# Patient Record
Sex: Female | Born: 1970 | Race: Black or African American | Hispanic: No | Marital: Married | Smoking: Never smoker
Health system: Southern US, Community
[De-identification: ages and names within clinical notes are randomized; demographics above are authoritative.]

## PROBLEM LIST (undated history)

## (undated) DIAGNOSIS — R002 Palpitations: Secondary | ICD-10-CM

## (undated) DIAGNOSIS — I209 Angina pectoris, unspecified: Secondary | ICD-10-CM

## (undated) DIAGNOSIS — R569 Unspecified convulsions: Secondary | ICD-10-CM

## (undated) DIAGNOSIS — G620 Drug-induced polyneuropathy: Principal | ICD-10-CM

## (undated) DIAGNOSIS — R160 Hepatomegaly, not elsewhere classified: Secondary | ICD-10-CM

## (undated) DIAGNOSIS — F329 Major depressive disorder, single episode, unspecified: Secondary | ICD-10-CM

## (undated) DIAGNOSIS — I10 Essential (primary) hypertension: Secondary | ICD-10-CM

## (undated) DIAGNOSIS — I509 Heart failure, unspecified: Secondary | ICD-10-CM

## (undated) DIAGNOSIS — F419 Anxiety disorder, unspecified: Secondary | ICD-10-CM

## (undated) DIAGNOSIS — Z923 Personal history of irradiation: Secondary | ICD-10-CM

## (undated) DIAGNOSIS — E669 Obesity, unspecified: Secondary | ICD-10-CM

## (undated) DIAGNOSIS — G4700 Insomnia, unspecified: Secondary | ICD-10-CM

## (undated) DIAGNOSIS — E785 Hyperlipidemia, unspecified: Secondary | ICD-10-CM

## (undated) DIAGNOSIS — K219 Gastro-esophageal reflux disease without esophagitis: Secondary | ICD-10-CM

## (undated) DIAGNOSIS — N809 Endometriosis, unspecified: Secondary | ICD-10-CM

## (undated) DIAGNOSIS — Z95810 Presence of automatic (implantable) cardiac defibrillator: Secondary | ICD-10-CM

## (undated) DIAGNOSIS — F410 Panic disorder [episodic paroxysmal anxiety] without agoraphobia: Secondary | ICD-10-CM

## (undated) DIAGNOSIS — G629 Polyneuropathy, unspecified: Secondary | ICD-10-CM

## (undated) DIAGNOSIS — L709 Acne, unspecified: Secondary | ICD-10-CM

## (undated) DIAGNOSIS — R51 Headache: Secondary | ICD-10-CM

## (undated) DIAGNOSIS — N3010 Interstitial cystitis (chronic) without hematuria: Secondary | ICD-10-CM

## (undated) DIAGNOSIS — D649 Anemia, unspecified: Secondary | ICD-10-CM

## (undated) DIAGNOSIS — R921 Mammographic calcification found on diagnostic imaging of breast: Secondary | ICD-10-CM

## (undated) DIAGNOSIS — Z5189 Encounter for other specified aftercare: Secondary | ICD-10-CM

## (undated) DIAGNOSIS — E119 Type 2 diabetes mellitus without complications: Secondary | ICD-10-CM

## (undated) DIAGNOSIS — Z8711 Personal history of peptic ulcer disease: Secondary | ICD-10-CM

## (undated) DIAGNOSIS — J302 Other seasonal allergic rhinitis: Secondary | ICD-10-CM

## (undated) DIAGNOSIS — Z8719 Personal history of other diseases of the digestive system: Secondary | ICD-10-CM

## (undated) DIAGNOSIS — F09 Unspecified mental disorder due to known physiological condition: Secondary | ICD-10-CM

## (undated) DIAGNOSIS — S0592XA Unspecified injury of left eye and orbit, initial encounter: Secondary | ICD-10-CM

## (undated) DIAGNOSIS — F32A Depression, unspecified: Secondary | ICD-10-CM

## (undated) DIAGNOSIS — Z8041 Family history of malignant neoplasm of ovary: Secondary | ICD-10-CM

## (undated) DIAGNOSIS — C50919 Malignant neoplasm of unspecified site of unspecified female breast: Secondary | ICD-10-CM

## (undated) DIAGNOSIS — M797 Fibromyalgia: Secondary | ICD-10-CM

## (undated) DIAGNOSIS — F449 Dissociative and conversion disorder, unspecified: Secondary | ICD-10-CM

## (undated) DIAGNOSIS — Z800 Family history of malignant neoplasm of digestive organs: Secondary | ICD-10-CM

## (undated) DIAGNOSIS — I428 Other cardiomyopathies: Secondary | ICD-10-CM

## (undated) DIAGNOSIS — F0789 Other personality and behavioral disorders due to known physiological condition: Secondary | ICD-10-CM

## (undated) DIAGNOSIS — R270 Ataxia, unspecified: Secondary | ICD-10-CM

## (undated) DIAGNOSIS — J189 Pneumonia, unspecified organism: Secondary | ICD-10-CM

## (undated) DIAGNOSIS — R519 Headache, unspecified: Secondary | ICD-10-CM

## (undated) DIAGNOSIS — M1990 Unspecified osteoarthritis, unspecified site: Secondary | ICD-10-CM

## (undated) DIAGNOSIS — J449 Chronic obstructive pulmonary disease, unspecified: Secondary | ICD-10-CM

## (undated) HISTORY — DX: Essential (primary) hypertension: I10

## (undated) HISTORY — PX: BREAST EXCISIONAL BIOPSY: SUR124

## (undated) HISTORY — DX: Endometriosis, unspecified: N80.9

## (undated) HISTORY — DX: Polyneuropathy, unspecified: G62.9

## (undated) HISTORY — PX: PORT-A-CATH REMOVAL: SHX5289

## (undated) HISTORY — DX: Insomnia, unspecified: G47.00

## (undated) HISTORY — DX: Other seasonal allergic rhinitis: J30.2

## (undated) HISTORY — DX: Drug-induced polyneuropathy: G62.0

## (undated) HISTORY — DX: Hyperlipidemia, unspecified: E78.5

## (undated) HISTORY — DX: Major depressive disorder, single episode, unspecified: F32.9

## (undated) HISTORY — DX: Mammographic calcification found on diagnostic imaging of breast: R92.1

## (undated) HISTORY — DX: Other personality and behavioral disorders due to known physiological condition: F07.89

## (undated) HISTORY — DX: Depression, unspecified: F32.A

## (undated) HISTORY — PX: ABDOMINAL HYSTERECTOMY: SHX81

## (undated) HISTORY — DX: Ataxia, unspecified: R27.0

## (undated) HISTORY — DX: Family history of malignant neoplasm of ovary: Z80.41

## (undated) HISTORY — PX: BREAST LUMPECTOMY: SHX2

## (undated) HISTORY — DX: Panic disorder (episodic paroxysmal anxiety): F41.0

## (undated) HISTORY — DX: Malignant neoplasm of unspecified site of unspecified female breast: C50.919

## (undated) HISTORY — DX: Chronic obstructive pulmonary disease, unspecified: J44.9

## (undated) HISTORY — PX: CHOLECYSTECTOMY: SHX55

## (undated) HISTORY — DX: Fibromyalgia: M79.7

## (undated) HISTORY — PX: COLONOSCOPY: SHX174

## (undated) HISTORY — PX: NASAL SINUS SURGERY: SHX719

## (undated) HISTORY — DX: Heart failure, unspecified: I50.9

## (undated) HISTORY — DX: Family history of malignant neoplasm of digestive organs: Z80.0

## (undated) HISTORY — DX: Unspecified mental disorder due to known physiological condition: F09

## (undated) HISTORY — DX: Hepatomegaly, not elsewhere classified: R16.0

## (undated) HISTORY — DX: Anemia, unspecified: D64.9

## (undated) HISTORY — PX: TONSILLECTOMY: SUR1361

## (undated) HISTORY — DX: Encounter for other specified aftercare: Z51.89

## (undated) HISTORY — DX: Palpitations: R00.2

## (undated) HISTORY — PX: TUBAL LIGATION: SHX77

## (undated) HISTORY — DX: Obesity, unspecified: E66.9

## (undated) HISTORY — DX: Acne, unspecified: L70.9

## (undated) HISTORY — DX: Gastro-esophageal reflux disease without esophagitis: K21.9

## (undated) HISTORY — PX: LAPAROSCOPIC ENDOMETRIOSIS FULGURATION: SUR769

## (undated) HISTORY — DX: Unspecified injury of left eye and orbit, initial encounter: S05.92XA

## (undated) HISTORY — DX: Unspecified osteoarthritis, unspecified site: M19.90

## (undated) HISTORY — DX: Other cardiomyopathies: I42.8

---
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
## 1999-02-13 DIAGNOSIS — J189 Pneumonia, unspecified organism: Secondary | ICD-10-CM

## 1999-02-13 HISTORY — DX: Pneumonia, unspecified organism: J18.9

## 1999-09-06 ENCOUNTER — Emergency Department (HOSPITAL_COMMUNITY): Admission: EM | Admit: 1999-09-06 | Discharge: 1999-09-06 | Payer: Self-pay | Admitting: Emergency Medicine

## 1999-09-06 ENCOUNTER — Encounter: Payer: Self-pay | Admitting: Internal Medicine

## 1999-09-11 ENCOUNTER — Encounter (INDEPENDENT_AMBULATORY_CARE_PROVIDER_SITE_OTHER): Payer: Self-pay

## 1999-09-11 ENCOUNTER — Ambulatory Visit (HOSPITAL_COMMUNITY): Admission: RE | Admit: 1999-09-11 | Discharge: 1999-09-11 | Payer: Self-pay | Admitting: Obstetrics and Gynecology

## 2000-11-01 ENCOUNTER — Other Ambulatory Visit: Admission: RE | Admit: 2000-11-01 | Discharge: 2000-11-01 | Payer: Self-pay | Admitting: Obstetrics and Gynecology

## 2000-11-09 ENCOUNTER — Ambulatory Visit (HOSPITAL_COMMUNITY): Admission: RE | Admit: 2000-11-09 | Discharge: 2000-11-09 | Payer: Self-pay | Admitting: Pulmonary Disease

## 2001-03-23 ENCOUNTER — Encounter: Payer: Self-pay | Admitting: Obstetrics and Gynecology

## 2001-03-23 ENCOUNTER — Ambulatory Visit (HOSPITAL_COMMUNITY): Admission: RE | Admit: 2001-03-23 | Discharge: 2001-03-23 | Payer: Self-pay | Admitting: Obstetrics and Gynecology

## 2001-03-24 ENCOUNTER — Inpatient Hospital Stay (HOSPITAL_COMMUNITY): Admission: AD | Admit: 2001-03-24 | Discharge: 2001-03-24 | Payer: Self-pay | Admitting: Obstetrics and Gynecology

## 2001-05-31 ENCOUNTER — Encounter (INDEPENDENT_AMBULATORY_CARE_PROVIDER_SITE_OTHER): Payer: Self-pay

## 2001-05-31 ENCOUNTER — Inpatient Hospital Stay (HOSPITAL_COMMUNITY): Admission: AD | Admit: 2001-05-31 | Discharge: 2001-06-04 | Payer: Self-pay | Admitting: Obstetrics and Gynecology

## 2001-12-01 ENCOUNTER — Other Ambulatory Visit: Admission: RE | Admit: 2001-12-01 | Discharge: 2001-12-01 | Payer: Self-pay | Admitting: Obstetrics and Gynecology

## 2003-01-08 ENCOUNTER — Other Ambulatory Visit: Admission: RE | Admit: 2003-01-08 | Discharge: 2003-01-08 | Payer: Self-pay | Admitting: Obstetrics and Gynecology

## 2003-11-15 ENCOUNTER — Encounter: Admission: RE | Admit: 2003-11-15 | Discharge: 2003-11-15 | Payer: Self-pay | Admitting: Internal Medicine

## 2004-01-09 ENCOUNTER — Other Ambulatory Visit: Admission: RE | Admit: 2004-01-09 | Discharge: 2004-01-09 | Payer: Self-pay | Admitting: Obstetrics and Gynecology

## 2004-02-07 ENCOUNTER — Emergency Department (HOSPITAL_COMMUNITY): Admission: EM | Admit: 2004-02-07 | Discharge: 2004-02-07 | Payer: Self-pay | Admitting: Emergency Medicine

## 2005-03-03 ENCOUNTER — Emergency Department (HOSPITAL_COMMUNITY): Admission: EM | Admit: 2005-03-03 | Discharge: 2005-03-03 | Payer: Self-pay | Admitting: Emergency Medicine

## 2005-03-04 ENCOUNTER — Other Ambulatory Visit: Admission: RE | Admit: 2005-03-04 | Discharge: 2005-03-04 | Payer: Self-pay | Admitting: Obstetrics and Gynecology

## 2005-08-20 ENCOUNTER — Encounter (INDEPENDENT_AMBULATORY_CARE_PROVIDER_SITE_OTHER): Payer: Self-pay | Admitting: Specialist

## 2005-08-20 ENCOUNTER — Ambulatory Visit (HOSPITAL_COMMUNITY): Admission: RE | Admit: 2005-08-20 | Discharge: 2005-08-20 | Payer: Self-pay | Admitting: Obstetrics and Gynecology

## 2006-04-13 ENCOUNTER — Ambulatory Visit (HOSPITAL_COMMUNITY): Admission: RE | Admit: 2006-04-13 | Discharge: 2006-04-14 | Payer: Self-pay | Admitting: Obstetrics and Gynecology

## 2006-04-13 ENCOUNTER — Encounter (INDEPENDENT_AMBULATORY_CARE_PROVIDER_SITE_OTHER): Payer: Self-pay | Admitting: Specialist

## 2006-08-01 ENCOUNTER — Encounter: Admission: RE | Admit: 2006-08-01 | Discharge: 2006-08-01 | Payer: Self-pay | Admitting: Obstetrics and Gynecology

## 2006-08-01 ENCOUNTER — Encounter (INDEPENDENT_AMBULATORY_CARE_PROVIDER_SITE_OTHER): Payer: Self-pay | Admitting: Diagnostic Radiology

## 2006-08-01 ENCOUNTER — Encounter (INDEPENDENT_AMBULATORY_CARE_PROVIDER_SITE_OTHER): Payer: Self-pay | Admitting: *Deleted

## 2006-08-10 ENCOUNTER — Encounter: Admission: RE | Admit: 2006-08-10 | Discharge: 2006-08-10 | Payer: Self-pay | Admitting: Obstetrics and Gynecology

## 2006-08-19 ENCOUNTER — Encounter: Admission: RE | Admit: 2006-08-19 | Discharge: 2006-08-19 | Payer: Self-pay | Admitting: General Surgery

## 2006-08-22 ENCOUNTER — Ambulatory Visit (HOSPITAL_BASED_OUTPATIENT_CLINIC_OR_DEPARTMENT_OTHER): Admission: RE | Admit: 2006-08-22 | Discharge: 2006-08-22 | Payer: Self-pay | Admitting: General Surgery

## 2006-08-22 ENCOUNTER — Encounter (INDEPENDENT_AMBULATORY_CARE_PROVIDER_SITE_OTHER): Payer: Self-pay | Admitting: Specialist

## 2006-08-24 ENCOUNTER — Ambulatory Visit: Payer: Self-pay | Admitting: Oncology

## 2006-08-31 LAB — CBC WITH DIFFERENTIAL/PLATELET
Basophils Absolute: 0 10*3/uL (ref 0.0–0.1)
EOS%: 0.9 % (ref 0.0–7.0)
HGB: 11.5 g/dL — ABNORMAL LOW (ref 11.6–15.9)
MCH: 30.4 pg (ref 26.0–34.0)
MCHC: 34.2 g/dL (ref 32.0–36.0)
MCV: 88.8 fL (ref 81.0–101.0)
MONO%: 8.3 % (ref 0.0–13.0)
RDW: 13 % (ref 11.3–14.5)

## 2006-08-31 LAB — COMPREHENSIVE METABOLIC PANEL
AST: 58 U/L — ABNORMAL HIGH (ref 0–37)
Alkaline Phosphatase: 71 U/L (ref 39–117)
BUN: 10 mg/dL (ref 6–23)
Creatinine, Ser: 0.64 mg/dL (ref 0.40–1.20)
Potassium: 4 mEq/L (ref 3.5–5.3)

## 2006-09-06 ENCOUNTER — Ambulatory Visit (HOSPITAL_COMMUNITY): Admission: RE | Admit: 2006-09-06 | Discharge: 2006-09-06 | Payer: Self-pay | Admitting: Oncology

## 2006-09-07 ENCOUNTER — Ambulatory Visit (HOSPITAL_BASED_OUTPATIENT_CLINIC_OR_DEPARTMENT_OTHER): Admission: RE | Admit: 2006-09-07 | Discharge: 2006-09-08 | Payer: Self-pay | Admitting: General Surgery

## 2006-09-07 ENCOUNTER — Encounter (INDEPENDENT_AMBULATORY_CARE_PROVIDER_SITE_OTHER): Payer: Self-pay | Admitting: Specialist

## 2006-09-15 ENCOUNTER — Ambulatory Visit (HOSPITAL_COMMUNITY): Admission: RE | Admit: 2006-09-15 | Discharge: 2006-09-15 | Payer: Self-pay | Admitting: Oncology

## 2006-09-16 ENCOUNTER — Ambulatory Visit: Payer: Self-pay

## 2006-09-16 ENCOUNTER — Encounter: Payer: Self-pay | Admitting: Cardiology

## 2006-09-21 LAB — CBC WITH DIFFERENTIAL/PLATELET
Basophils Absolute: 0 10*3/uL (ref 0.0–0.1)
Eosinophils Absolute: 0.1 10*3/uL (ref 0.0–0.5)
HCT: 34.5 % — ABNORMAL LOW (ref 34.8–46.6)
LYMPH%: 27.3 % (ref 14.0–48.0)
MCHC: 34.1 g/dL (ref 32.0–36.0)
MONO#: 0.6 10*3/uL (ref 0.1–0.9)
NEUT#: 4.9 10*3/uL (ref 1.5–6.5)
NEUT%: 63.2 % (ref 39.6–76.8)
Platelets: 298 10*3/uL (ref 145–400)
WBC: 7.7 10*3/uL (ref 3.9–10.0)

## 2006-09-21 LAB — LACTATE DEHYDROGENASE: LDH: 159 U/L (ref 94–250)

## 2006-09-21 LAB — COMPREHENSIVE METABOLIC PANEL
BUN: 12 mg/dL (ref 6–23)
CO2: 24 mEq/L (ref 19–32)
Creatinine, Ser: 0.8 mg/dL (ref 0.40–1.20)
Glucose, Bld: 95 mg/dL (ref 70–99)
Total Bilirubin: 0.7 mg/dL (ref 0.3–1.2)

## 2006-09-21 LAB — CANCER ANTIGEN 27.29: CA 27.29: 23 U/mL (ref 0–39)

## 2006-10-10 ENCOUNTER — Ambulatory Visit: Payer: Self-pay | Admitting: Oncology

## 2006-10-10 ENCOUNTER — Encounter: Admission: RE | Admit: 2006-10-10 | Discharge: 2007-01-08 | Payer: Self-pay | Admitting: General Surgery

## 2006-10-10 LAB — UA PROTEIN, DIPSTICK - CHCC: Protein, Urine: NEGATIVE mg/dL

## 2006-10-10 LAB — PROTHROMBIN TIME
INR: 1 (ref 0.0–1.5)
Prothrombin Time: 13.5 seconds (ref 11.6–15.2)

## 2006-10-10 LAB — APTT: aPTT: 28 seconds (ref 24–37)

## 2006-10-11 LAB — PROTEIN / CREATININE RATIO, URINE: Creatinine, Urine: 148.2 mg/dL

## 2006-10-20 LAB — CBC WITH DIFFERENTIAL/PLATELET
BASO%: 0.5 % (ref 0.0–2.0)
Basophils Absolute: 0 10*3/uL (ref 0.0–0.1)
HCT: 35.5 % (ref 34.8–46.6)
HGB: 12 g/dL (ref 11.6–15.9)
MONO#: 0.5 10*3/uL (ref 0.1–0.9)
NEUT#: 3.1 10*3/uL (ref 1.5–6.5)
NEUT%: 53.5 % (ref 39.6–76.8)
WBC: 5.8 10*3/uL (ref 3.9–10.0)
lymph#: 2.1 10*3/uL (ref 0.9–3.3)

## 2006-10-20 LAB — COMPREHENSIVE METABOLIC PANEL
AST: 15 U/L (ref 0–37)
Albumin: 4.7 g/dL (ref 3.5–5.2)
BUN: 8 mg/dL (ref 6–23)
Calcium: 9.3 mg/dL (ref 8.4–10.5)
Chloride: 103 mEq/L (ref 96–112)
Potassium: 4 mEq/L (ref 3.5–5.3)

## 2006-11-03 LAB — COMPREHENSIVE METABOLIC PANEL
ALT: 23 U/L (ref 0–35)
AST: 22 U/L (ref 0–37)
Albumin: 4.8 g/dL (ref 3.5–5.2)
Alkaline Phosphatase: 92 U/L (ref 39–117)
Potassium: 4.2 mEq/L (ref 3.5–5.3)
Sodium: 135 mEq/L (ref 135–145)
Total Protein: 7.3 g/dL (ref 6.0–8.3)

## 2006-11-03 LAB — CBC WITH DIFFERENTIAL/PLATELET
BASO%: 0.4 % (ref 0.0–2.0)
Basophils Absolute: 0.1 10*3/uL (ref 0.0–0.1)
EOS%: 0 % (ref 0.0–7.0)
HGB: 11.5 g/dL — ABNORMAL LOW (ref 11.6–15.9)
MCH: 30.2 pg (ref 26.0–34.0)
MCHC: 34.3 g/dL (ref 32.0–36.0)
RBC: 3.81 10*6/uL (ref 3.70–5.32)
RDW: 11.2 % — ABNORMAL LOW (ref 11.3–14.5)
lymph#: 1.5 10*3/uL (ref 0.9–3.3)

## 2006-11-03 LAB — UA PROTEIN, DIPSTICK - CHCC: Protein, Urine: NEGATIVE mg/dL

## 2006-11-14 LAB — CBC WITH DIFFERENTIAL/PLATELET
BASO%: 0.8 % (ref 0.0–2.0)
Eosinophils Absolute: 0 10*3/uL (ref 0.0–0.5)
MCHC: 33.5 g/dL (ref 32.0–36.0)
MONO#: 0.7 10*3/uL (ref 0.1–0.9)
NEUT#: 8.2 10*3/uL — ABNORMAL HIGH (ref 1.5–6.5)
RBC: 3.54 10*6/uL — ABNORMAL LOW (ref 3.70–5.32)
RDW: 12.8 % (ref 11.3–14.5)
WBC: 10.5 10*3/uL — ABNORMAL HIGH (ref 3.9–10.0)
lymph#: 1.5 10*3/uL (ref 0.9–3.3)

## 2006-11-17 LAB — COMPREHENSIVE METABOLIC PANEL
ALT: 35 U/L (ref 0–35)
CO2: 24 mEq/L (ref 19–32)
Calcium: 8.7 mg/dL (ref 8.4–10.5)
Chloride: 105 mEq/L (ref 96–112)
Creatinine, Ser: 0.56 mg/dL (ref 0.40–1.20)
Glucose, Bld: 86 mg/dL (ref 70–99)
Total Protein: 7 g/dL (ref 6.0–8.3)

## 2006-11-17 LAB — CBC WITH DIFFERENTIAL/PLATELET
BASO%: 0.9 % (ref 0.0–2.0)
Eosinophils Absolute: 0 10*3/uL (ref 0.0–0.5)
HCT: 31.7 % — ABNORMAL LOW (ref 34.8–46.6)
HGB: 10.9 g/dL — ABNORMAL LOW (ref 11.6–15.9)
MCHC: 34.4 g/dL (ref 32.0–36.0)
MONO#: 0.8 10*3/uL (ref 0.1–0.9)
NEUT#: 7.1 10*3/uL — ABNORMAL HIGH (ref 1.5–6.5)
NEUT%: 76.5 % (ref 39.6–76.8)
WBC: 9.3 10*3/uL (ref 3.9–10.0)
lymph#: 1.2 10*3/uL (ref 0.9–3.3)

## 2006-11-17 LAB — PROTEIN / CREATININE RATIO, URINE: Creatinine, Urine: 214.7 mg/dL

## 2006-11-23 LAB — CBC WITH DIFFERENTIAL/PLATELET
BASO%: 0 % (ref 0.0–2.0)
LYMPH%: 6 % — ABNORMAL LOW (ref 14.0–48.0)
MCHC: 34.1 g/dL (ref 32.0–36.0)
MCV: 90 fL (ref 81.0–101.0)
MONO%: 0.5 % (ref 0.0–13.0)
Platelets: 155 10*3/uL (ref 145–400)
RBC: 3.78 10*6/uL (ref 3.70–5.32)
WBC: 11.3 10*3/uL — ABNORMAL HIGH (ref 3.9–10.0)

## 2006-11-29 ENCOUNTER — Ambulatory Visit: Payer: Self-pay | Admitting: Oncology

## 2006-12-01 ENCOUNTER — Ambulatory Visit: Payer: Self-pay | Admitting: Internal Medicine

## 2006-12-01 LAB — CBC WITH DIFFERENTIAL/PLATELET
Basophils Absolute: 0 10*3/uL (ref 0.0–0.1)
EOS%: 0.6 % (ref 0.0–7.0)
HCT: 31.5 % — ABNORMAL LOW (ref 34.8–46.6)
HGB: 10.7 g/dL — ABNORMAL LOW (ref 11.6–15.9)
LYMPH%: 13 % — ABNORMAL LOW (ref 14.0–48.0)
MCH: 30.9 pg (ref 26.0–34.0)
MCHC: 33.9 g/dL (ref 32.0–36.0)
NEUT%: 71.9 % (ref 39.6–76.8)
Platelets: 272 10*3/uL (ref 145–400)
lymph#: 0.9 10*3/uL (ref 0.9–3.3)

## 2006-12-01 LAB — COMPREHENSIVE METABOLIC PANEL
BUN: 7 mg/dL (ref 6–23)
CO2: 28 mEq/L (ref 19–32)
Calcium: 9.1 mg/dL (ref 8.4–10.5)
Chloride: 103 mEq/L (ref 96–112)
Creatinine, Ser: 0.49 mg/dL (ref 0.40–1.20)
Total Bilirubin: 0.4 mg/dL (ref 0.3–1.2)

## 2006-12-01 LAB — UA PROTEIN, DIPSTICK - CHCC: Protein, Urine: NEGATIVE mg/dL

## 2006-12-07 ENCOUNTER — Ambulatory Visit: Admission: RE | Admit: 2006-12-07 | Discharge: 2006-12-07 | Payer: Self-pay | Admitting: Oncology

## 2006-12-07 ENCOUNTER — Encounter: Payer: Self-pay | Admitting: Oncology

## 2006-12-08 ENCOUNTER — Ambulatory Visit: Payer: Self-pay | Admitting: Oncology

## 2006-12-08 LAB — CBC WITH DIFFERENTIAL/PLATELET
Basophils Absolute: 0 10*3/uL (ref 0.0–0.1)
Eosinophils Absolute: 0 10*3/uL (ref 0.0–0.5)
HCT: 33 % — ABNORMAL LOW (ref 34.8–46.6)
HGB: 10.8 g/dL — ABNORMAL LOW (ref 11.6–15.9)
MONO#: 0.1 10*3/uL (ref 0.1–0.9)
NEUT#: 2.7 10*3/uL (ref 1.5–6.5)
RDW: 15.4 % — ABNORMAL HIGH (ref 11.3–14.5)
lymph#: 0.6 10*3/uL — ABNORMAL LOW (ref 0.9–3.3)

## 2006-12-14 ENCOUNTER — Other Ambulatory Visit: Payer: Self-pay | Admitting: Oncology

## 2006-12-14 LAB — CBC WITH DIFFERENTIAL/PLATELET
Basophils Absolute: 0.1 10*3/uL (ref 0.0–0.1)
EOS%: 0.1 % (ref 0.0–7.0)
Eosinophils Absolute: 0 10*3/uL (ref 0.0–0.5)
HGB: 10.5 g/dL — ABNORMAL LOW (ref 11.6–15.9)
MCH: 30.8 pg (ref 26.0–34.0)
MONO#: 1.1 10*3/uL — ABNORMAL HIGH (ref 0.1–0.9)
NEUT#: 8.7 10*3/uL — ABNORMAL HIGH (ref 1.5–6.5)
RDW: 17 % — ABNORMAL HIGH (ref 11.3–14.5)
WBC: 11.1 10*3/uL — ABNORMAL HIGH (ref 3.9–10.0)
lymph#: 1.1 10*3/uL (ref 0.9–3.3)

## 2006-12-14 LAB — COMPREHENSIVE METABOLIC PANEL
ALT: 25 U/L (ref 0–35)
Albumin: 3.8 g/dL (ref 3.5–5.2)
CO2: 27 mEq/L (ref 19–32)
Glucose, Bld: 94 mg/dL (ref 70–99)
Potassium: 3.6 mEq/L (ref 3.5–5.3)
Sodium: 136 mEq/L (ref 135–145)
Total Bilirubin: 0.6 mg/dL (ref 0.3–1.2)
Total Protein: 6.4 g/dL (ref 6.0–8.3)

## 2006-12-15 LAB — PROTHROMBIN TIME: Prothrombin Time: 12.6 seconds (ref 11.6–15.2)

## 2006-12-15 LAB — APTT: aPTT: 35 seconds (ref 24–37)

## 2006-12-19 LAB — PROTEIN / CREATININE RATIO, URINE
Protein Creatinine Ratio: 0.06 (ref <0.15)
Total Protein, Urine: 17 mg/dL

## 2006-12-23 LAB — CBC WITH DIFFERENTIAL/PLATELET
Basophils Absolute: 0 10*3/uL (ref 0.0–0.1)
EOS%: 0.3 % (ref 0.0–7.0)
Eosinophils Absolute: 0 10*3/uL (ref 0.0–0.5)
HGB: 11 g/dL — ABNORMAL LOW (ref 11.6–15.9)
LYMPH%: 18.7 % (ref 14.0–48.0)
MCH: 31.2 pg (ref 26.0–34.0)
MCV: 92.9 fL (ref 81.0–101.0)
MONO%: 12.7 % (ref 0.0–13.0)
NEUT#: 3 10*3/uL (ref 1.5–6.5)
Platelets: 296 10*3/uL (ref 145–400)
RBC: 3.51 10*6/uL — ABNORMAL LOW (ref 3.70–5.32)

## 2006-12-29 LAB — CBC WITH DIFFERENTIAL/PLATELET
BASO%: 1.6 % (ref 0.0–2.0)
EOS%: 0.8 % (ref 0.0–7.0)
LYMPH%: 24.6 % (ref 14.0–48.0)
MCHC: 32.8 g/dL (ref 32.0–36.0)
MCV: 95.6 fL (ref 81.0–101.0)
MONO%: 8.2 % (ref 0.0–13.0)
Platelets: 415 10*3/uL — ABNORMAL HIGH (ref 145–400)
RBC: 3.98 10*6/uL (ref 3.70–5.32)
RDW: 16.5 % — ABNORMAL HIGH (ref 11.3–14.5)

## 2007-01-05 LAB — UA PROTEIN, DIPSTICK - CHCC: Protein, Urine: NEGATIVE mg/dL

## 2007-01-05 LAB — COMPREHENSIVE METABOLIC PANEL
AST: 28 U/L (ref 0–37)
Alkaline Phosphatase: 62 U/L (ref 39–117)
BUN: 5 mg/dL — ABNORMAL LOW (ref 6–23)
Creatinine, Ser: 0.54 mg/dL (ref 0.40–1.20)

## 2007-01-05 LAB — CBC WITH DIFFERENTIAL/PLATELET
BASO%: 2.4 % — ABNORMAL HIGH (ref 0.0–2.0)
EOS%: 3.1 % (ref 0.0–7.0)
HCT: 37.4 % (ref 34.8–46.6)
LYMPH%: 32.5 % (ref 14.0–48.0)
MCH: 31.7 pg (ref 26.0–34.0)
MCHC: 33.5 g/dL (ref 32.0–36.0)
NEUT%: 55 % (ref 39.6–76.8)
Platelets: 268 10*3/uL (ref 145–400)
RBC: 3.96 10*6/uL (ref 3.70–5.32)
lymph#: 1.2 10*3/uL (ref 0.9–3.3)

## 2007-01-11 ENCOUNTER — Ambulatory Visit: Payer: Self-pay | Admitting: Oncology

## 2007-01-11 ENCOUNTER — Encounter: Admission: RE | Admit: 2007-01-11 | Discharge: 2007-01-26 | Payer: Self-pay | Admitting: Oncology

## 2007-01-19 LAB — CBC WITH DIFFERENTIAL/PLATELET
Basophils Absolute: 0.1 10*3/uL (ref 0.0–0.1)
EOS%: 1.2 % (ref 0.0–7.0)
HCT: 36.7 % (ref 34.8–46.6)
HGB: 12.2 g/dL (ref 11.6–15.9)
LYMPH%: 30.7 % (ref 14.0–48.0)
MCH: 31.5 pg (ref 26.0–34.0)
MCV: 94.9 fL (ref 81.0–101.0)
MONO%: 8.5 % (ref 0.0–13.0)
NEUT%: 57.7 % (ref 39.6–76.8)
Platelets: 219 10*3/uL (ref 145–400)
lymph#: 0.9 10*3/uL (ref 0.9–3.3)

## 2007-01-21 ENCOUNTER — Ambulatory Visit (HOSPITAL_COMMUNITY): Admission: RE | Admit: 2007-01-21 | Discharge: 2007-01-21 | Payer: Self-pay | Admitting: Oncology

## 2007-01-26 LAB — CBC WITH DIFFERENTIAL/PLATELET
Basophils Absolute: 0.1 10*3/uL (ref 0.0–0.1)
EOS%: 2.7 % (ref 0.0–7.0)
HGB: 12.5 g/dL (ref 11.6–15.9)
MCH: 32.1 pg (ref 26.0–34.0)
MCV: 94.8 fL (ref 81.0–101.0)
MONO%: 7.2 % (ref 0.0–13.0)
RDW: 14.5 % (ref 11.3–14.5)

## 2007-01-26 LAB — PROTEIN / CREATININE RATIO, URINE
Protein Creatinine Ratio: 0.1 (ref <0.15)
Total Protein, Urine: 17 mg/dL

## 2007-01-26 LAB — COMPREHENSIVE METABOLIC PANEL
AST: 27 U/L (ref 0–37)
Albumin: 3.8 g/dL (ref 3.5–5.2)
Alkaline Phosphatase: 67 U/L (ref 39–117)
BUN: 8 mg/dL (ref 6–23)
Creatinine, Ser: 0.45 mg/dL (ref 0.40–1.20)
Potassium: 4.2 mEq/L (ref 3.5–5.3)

## 2007-02-02 LAB — CBC WITH DIFFERENTIAL/PLATELET
BASO%: 1.8 % (ref 0.0–2.0)
EOS%: 2.6 % (ref 0.0–7.0)
MCH: 31.6 pg (ref 26.0–34.0)
MCHC: 33.8 g/dL (ref 32.0–36.0)
NEUT%: 58.9 % (ref 39.6–76.8)
RDW: 13.6 % (ref 11.3–14.5)
lymph#: 1.2 10*3/uL (ref 0.9–3.3)

## 2007-02-09 LAB — CBC WITH DIFFERENTIAL/PLATELET
Basophils Absolute: 0.1 10*3/uL (ref 0.0–0.1)
EOS%: 1.5 % (ref 0.0–7.0)
HGB: 12.4 g/dL (ref 11.6–15.9)
MCH: 31.5 pg (ref 26.0–34.0)
NEUT#: 2.4 10*3/uL (ref 1.5–6.5)
RDW: 13.6 % (ref 11.3–14.5)
lymph#: 1.1 10*3/uL (ref 0.9–3.3)

## 2007-02-16 LAB — COMPREHENSIVE METABOLIC PANEL
CO2: 30 mEq/L (ref 19–32)
Calcium: 9 mg/dL (ref 8.4–10.5)
Chloride: 106 mEq/L (ref 96–112)
Creatinine, Ser: 0.61 mg/dL (ref 0.40–1.20)
Glucose, Bld: 119 mg/dL — ABNORMAL HIGH (ref 70–99)
Total Bilirubin: 0.6 mg/dL (ref 0.3–1.2)
Total Protein: 6.5 g/dL (ref 6.0–8.3)

## 2007-02-16 LAB — CBC WITH DIFFERENTIAL/PLATELET
Basophils Absolute: 0 10*3/uL (ref 0.0–0.1)
Eosinophils Absolute: 0 10*3/uL (ref 0.0–0.5)
HGB: 11.9 g/dL (ref 11.6–15.9)
MCV: 92.6 fL (ref 81.0–101.0)
MONO#: 0.1 10*3/uL (ref 0.1–0.9)
MONO%: 4.8 % (ref 0.0–13.0)
NEUT#: 1.7 10*3/uL (ref 1.5–6.5)
RDW: 13.8 % (ref 11.3–14.5)
lymph#: 1 10*3/uL (ref 0.9–3.3)

## 2007-02-21 ENCOUNTER — Ambulatory Visit: Payer: Self-pay | Admitting: Oncology

## 2007-02-23 ENCOUNTER — Ambulatory Visit: Payer: Self-pay | Admitting: Psychiatry

## 2007-02-23 LAB — CBC WITH DIFFERENTIAL/PLATELET
BASO%: 2.1 % — ABNORMAL HIGH (ref 0.0–2.0)
Eosinophils Absolute: 0 10*3/uL (ref 0.0–0.5)
HCT: 34.4 % — ABNORMAL LOW (ref 34.8–46.6)
LYMPH%: 44.1 % (ref 14.0–48.0)
MCHC: 35.4 g/dL (ref 32.0–36.0)
MONO#: 0.3 10*3/uL (ref 0.1–0.9)
NEUT#: 1.2 10*3/uL — ABNORMAL LOW (ref 1.5–6.5)
Platelets: 290 10*3/uL (ref 145–400)
RBC: 3.76 10*6/uL (ref 3.70–5.32)
WBC: 2.8 10*3/uL — ABNORMAL LOW (ref 3.9–10.0)
lymph#: 1.3 10*3/uL (ref 0.9–3.3)

## 2007-02-24 LAB — FOLLICLE STIMULATING HORMONE: FSH: 85 m[IU]/mL

## 2007-03-02 LAB — CBC WITH DIFFERENTIAL/PLATELET
BASO%: 2.1 % — ABNORMAL HIGH (ref 0.0–2.0)
Eosinophils Absolute: 0.1 10*3/uL (ref 0.0–0.5)
MCHC: 34.5 g/dL (ref 32.0–36.0)
MONO#: 0.2 10*3/uL (ref 0.1–0.9)
NEUT#: 1.7 10*3/uL (ref 1.5–6.5)
Platelets: 304 10*3/uL (ref 145–400)
RBC: 3.8 10*6/uL (ref 3.70–5.32)
RDW: 13.7 % (ref 11.3–14.5)
WBC: 3.5 10*3/uL — ABNORMAL LOW (ref 3.9–10.0)
lymph#: 1.5 10*3/uL (ref 0.9–3.3)

## 2007-03-06 ENCOUNTER — Ambulatory Visit: Payer: Self-pay

## 2007-03-06 ENCOUNTER — Ambulatory Visit: Admission: RE | Admit: 2007-03-06 | Discharge: 2007-05-23 | Payer: Self-pay | Admitting: Radiation Oncology

## 2007-03-06 ENCOUNTER — Encounter: Payer: Self-pay | Admitting: Oncology

## 2007-03-06 LAB — ESTRADIOL, ULTRA SENS: Estradiol, Ultra Sensitive: <2 pg/mL

## 2007-03-09 ENCOUNTER — Ambulatory Visit: Payer: Self-pay | Admitting: Internal Medicine

## 2007-03-09 LAB — COMPREHENSIVE METABOLIC PANEL
ALT: 43 U/L — ABNORMAL HIGH (ref 0–35)
AST: 35 U/L (ref 0–37)
Albumin: 3.8 g/dL (ref 3.5–5.2)
Alkaline Phosphatase: 48 U/L (ref 39–117)
BUN: 6 mg/dL (ref 6–23)
Calcium: 9 mg/dL (ref 8.4–10.5)
Chloride: 109 mEq/L (ref 96–112)
Creatinine, Ser: 0.67 mg/dL (ref 0.40–1.20)
Potassium: 4.2 mEq/L (ref 3.5–5.3)

## 2007-03-09 LAB — PROTEIN / CREATININE RATIO, URINE
Creatinine, Urine: 129.6 mg/dL
Protein Creatinine Ratio: 0.06 (ref <0.15)

## 2007-03-09 LAB — PROTHROMBIN TIME: INR: 0.9 (ref 0.0–1.5)

## 2007-03-09 LAB — CBC WITH DIFFERENTIAL/PLATELET
BASO%: 0.6 % (ref 0.0–2.0)
Basophils Absolute: 0 10*3/uL (ref 0.0–0.1)
EOS%: 2.2 % (ref 0.0–7.0)
HGB: 11.9 g/dL (ref 11.6–15.9)
MCH: 32.7 pg (ref 26.0–34.0)
MCHC: 34.9 g/dL (ref 32.0–36.0)
MCV: 93.8 fL (ref 81.0–101.0)
MONO%: 6.4 % (ref 0.0–13.0)
RDW: 16.8 % — ABNORMAL HIGH (ref 11.3–14.5)
lymph#: 1.5 10*3/uL (ref 0.9–3.3)

## 2007-03-13 ENCOUNTER — Encounter: Admission: RE | Admit: 2007-03-13 | Discharge: 2007-03-13 | Payer: Self-pay | Admitting: Radiation Oncology

## 2007-03-23 ENCOUNTER — Ambulatory Visit: Payer: Self-pay | Admitting: Internal Medicine

## 2007-03-23 LAB — CONVERTED CEMR LAB
ALT: 35 units/L (ref 0–35)
Albumin: 4.4 g/dL (ref 3.5–5.2)
Alkaline Phosphatase: 76 units/L (ref 39–117)
BUN: 7 mg/dL (ref 6–23)
Calcium: 9.6 mg/dL (ref 8.4–10.5)
Eosinophils Absolute: 0.8 10*3/uL — ABNORMAL HIGH (ref 0.0–0.6)
GFR calc Af Amer: 122 mL/min
GFR calc non Af Amer: 101 mL/min
Lymphocytes Relative: 31.7 % (ref 12.0–46.0)
MCV: 93.6 fL (ref 78.0–100.0)
Monocytes Relative: 15 % — ABNORMAL HIGH (ref 3.0–11.0)
Neutro Abs: 2.8 10*3/uL (ref 1.4–7.7)
Platelets: 305 10*3/uL (ref 150–400)
RBC: 4.03 M/uL (ref 3.87–5.11)
TSH: 1.14 microintl units/mL (ref 0.35–5.50)

## 2007-03-24 LAB — CBC WITH DIFFERENTIAL/PLATELET
Basophils Absolute: 0.1 10*3/uL (ref 0.0–0.1)
Eosinophils Absolute: 0.5 10*3/uL (ref 0.0–0.5)
HGB: 12.1 g/dL (ref 11.6–15.9)
LYMPH%: 30.5 % (ref 14.0–48.0)
MCV: 94.6 fL (ref 81.0–101.0)
MONO%: 11.9 % (ref 0.0–13.0)
NEUT#: 3.1 10*3/uL (ref 1.5–6.5)
NEUT%: 48.9 % (ref 39.6–76.8)
Platelets: 260 10*3/uL (ref 145–400)

## 2007-03-30 LAB — COMPREHENSIVE METABOLIC PANEL
AST: 41 U/L — ABNORMAL HIGH (ref 0–37)
Alkaline Phosphatase: 80 U/L (ref 39–117)
BUN: 10 mg/dL (ref 6–23)
Glucose, Bld: 118 mg/dL — ABNORMAL HIGH (ref 70–99)
Sodium: 137 mEq/L (ref 135–145)
Total Bilirubin: 0.9 mg/dL (ref 0.3–1.2)
Total Protein: 7.5 g/dL (ref 6.0–8.3)

## 2007-03-30 LAB — CBC WITH DIFFERENTIAL/PLATELET
BASO%: 0.2 % (ref 0.0–2.0)
Basophils Absolute: 0 10*3/uL (ref 0.0–0.1)
EOS%: 16.2 % — ABNORMAL HIGH (ref 0.0–7.0)
HGB: 13.5 g/dL (ref 11.6–15.9)
MCH: 31.9 pg (ref 26.0–34.0)
MCHC: 34.7 g/dL (ref 32.0–36.0)
MCV: 91.9 fL (ref 81.0–101.0)
MONO%: 6.8 % (ref 0.0–13.0)
RDW: 14.9 % — ABNORMAL HIGH (ref 11.3–14.5)

## 2007-04-12 ENCOUNTER — Ambulatory Visit: Payer: Self-pay | Admitting: Internal Medicine

## 2007-04-17 ENCOUNTER — Encounter: Payer: Self-pay | Admitting: Oncology

## 2007-04-17 ENCOUNTER — Ambulatory Visit: Payer: Self-pay

## 2007-04-18 ENCOUNTER — Ambulatory Visit: Payer: Self-pay | Admitting: Oncology

## 2007-04-20 LAB — UA PROTEIN, DIPSTICK - CHCC: Protein, Urine: NEGATIVE mg/dL

## 2007-04-20 LAB — COMPREHENSIVE METABOLIC PANEL
ALT: 23 U/L (ref 0–35)
AST: 21 U/L (ref 0–37)
Albumin: 3.8 g/dL (ref 3.5–5.2)
BUN: 9 mg/dL (ref 6–23)
Calcium: 9 mg/dL (ref 8.4–10.5)
Chloride: 107 mEq/L (ref 96–112)
Potassium: 3.8 mEq/L (ref 3.5–5.3)
Sodium: 138 mEq/L (ref 135–145)
Total Protein: 6.8 g/dL (ref 6.0–8.3)

## 2007-04-20 LAB — CBC WITH DIFFERENTIAL/PLATELET
BASO%: 0.2 % (ref 0.0–2.0)
Eosinophils Absolute: 1 10*3/uL — ABNORMAL HIGH (ref 0.0–0.5)
LYMPH%: 10.3 % — ABNORMAL LOW (ref 14.0–48.0)
MCH: 31.4 pg (ref 26.0–34.0)
MCHC: 34.4 g/dL (ref 32.0–36.0)
MCV: 91.3 fL (ref 81.0–101.0)
MONO%: 8.9 % (ref 0.0–13.0)
Platelets: 175 10*3/uL (ref 145–400)
RBC: 3.9 10*6/uL (ref 3.70–5.32)

## 2007-04-21 ENCOUNTER — Ambulatory Visit: Payer: Self-pay | Admitting: Cardiology

## 2007-04-21 ENCOUNTER — Inpatient Hospital Stay (HOSPITAL_COMMUNITY): Admission: EM | Admit: 2007-04-21 | Discharge: 2007-04-22 | Payer: Self-pay | Admitting: Emergency Medicine

## 2007-05-04 ENCOUNTER — Ambulatory Visit: Payer: Self-pay | Admitting: Oncology

## 2007-05-04 ENCOUNTER — Ambulatory Visit: Payer: Self-pay | Admitting: Cardiology

## 2007-05-04 ENCOUNTER — Inpatient Hospital Stay (HOSPITAL_COMMUNITY): Admission: EM | Admit: 2007-05-04 | Discharge: 2007-05-10 | Payer: Self-pay | Admitting: Cardiology

## 2007-05-04 ENCOUNTER — Ambulatory Visit: Payer: Self-pay | Admitting: Emergency Medicine

## 2007-05-26 ENCOUNTER — Ambulatory Visit: Payer: Self-pay

## 2007-05-26 LAB — CONVERTED CEMR LAB
Basophils Relative: 3 % — ABNORMAL HIGH (ref 0.0–1.0)
CO2: 30 meq/L (ref 19–32)
Calcium: 9.2 mg/dL (ref 8.4–10.5)
Eosinophils Relative: 11.8 % — ABNORMAL HIGH (ref 0.0–5.0)
GFR calc Af Amer: 145 mL/min
Glucose, Bld: 75 mg/dL (ref 70–99)
HCT: 38.3 % (ref 36.0–46.0)
Hemoglobin: 12.7 g/dL (ref 12.0–15.0)
Lymphocytes Relative: 33.1 % (ref 12.0–46.0)
Neutro Abs: 1.9 10*3/uL (ref 1.4–7.7)
Platelets: 245 10*3/uL (ref 150–400)
WBC: 5 10*3/uL (ref 4.5–10.5)

## 2007-06-07 ENCOUNTER — Ambulatory Visit: Payer: Self-pay | Admitting: Internal Medicine

## 2007-06-12 ENCOUNTER — Ambulatory Visit: Payer: Self-pay | Admitting: Oncology

## 2007-06-20 ENCOUNTER — Ambulatory Visit: Payer: Self-pay | Admitting: Internal Medicine

## 2007-06-27 ENCOUNTER — Ambulatory Visit: Payer: Self-pay | Admitting: Internal Medicine

## 2007-06-27 ENCOUNTER — Ambulatory Visit (HOSPITAL_COMMUNITY): Admission: RE | Admit: 2007-06-27 | Discharge: 2007-06-27 | Payer: Self-pay | Admitting: Internal Medicine

## 2007-07-17 LAB — CBC WITH DIFFERENTIAL/PLATELET
Basophils Absolute: 0.1 10*3/uL (ref 0.0–0.1)
EOS%: 3.9 % (ref 0.0–7.0)
HCT: 36.8 % (ref 34.8–46.6)
HGB: 12 g/dL (ref 11.6–15.9)
LYMPH%: 25.4 % (ref 14.0–48.0)
MCH: 28.6 pg (ref 26.0–34.0)
MCV: 87.6 fL (ref 81.0–101.0)
MONO%: 11.1 % (ref 0.0–13.0)
NEUT%: 58.1 % (ref 39.6–76.8)
Platelets: 218 10*3/uL (ref 145–400)
lymph#: 1.5 10*3/uL (ref 0.9–3.3)

## 2007-07-21 ENCOUNTER — Encounter: Admission: RE | Admit: 2007-07-21 | Discharge: 2007-07-21 | Payer: Self-pay | Admitting: Oncology

## 2007-07-21 ENCOUNTER — Ambulatory Visit: Payer: Self-pay | Admitting: Internal Medicine

## 2007-08-17 ENCOUNTER — Ambulatory Visit: Payer: Self-pay

## 2007-08-17 ENCOUNTER — Encounter: Payer: Self-pay | Admitting: Internal Medicine

## 2007-08-17 ENCOUNTER — Ambulatory Visit: Payer: Self-pay | Admitting: Cardiology

## 2007-08-17 LAB — CONVERTED CEMR LAB
CO2: 31 meq/L (ref 19–32)
Calcium: 9.7 mg/dL (ref 8.4–10.5)
Chloride: 100 meq/L (ref 96–112)
GFR calc Af Amer: 121 mL/min
Glucose, Bld: 90 mg/dL (ref 70–99)

## 2007-09-19 ENCOUNTER — Ambulatory Visit: Payer: Self-pay | Admitting: Cardiology

## 2007-10-09 ENCOUNTER — Ambulatory Visit: Payer: Self-pay | Admitting: Oncology

## 2007-10-11 LAB — CBC WITH DIFFERENTIAL/PLATELET
Basophils Absolute: 0 10*3/uL (ref 0.0–0.1)
Eosinophils Absolute: 0.1 10*3/uL (ref 0.0–0.5)
HGB: 12 g/dL (ref 11.6–15.9)
LYMPH%: 22.2 % (ref 14.0–48.0)
MCV: 89 fL (ref 81.0–101.0)
MONO%: 7.5 % (ref 0.0–13.0)
NEUT#: 4.3 10*3/uL (ref 1.5–6.5)
NEUT%: 68.1 % (ref 39.6–76.8)
Platelets: 250 10*3/uL (ref 145–400)
RBC: 3.98 10*6/uL (ref 3.70–5.32)

## 2007-10-11 LAB — COMPREHENSIVE METABOLIC PANEL
Alkaline Phosphatase: 61 U/L (ref 39–117)
BUN: 12 mg/dL (ref 6–23)
Creatinine, Ser: 0.67 mg/dL (ref 0.40–1.20)
Glucose, Bld: 91 mg/dL (ref 70–99)
Total Bilirubin: 0.6 mg/dL (ref 0.3–1.2)

## 2007-10-12 LAB — FOLLICLE STIMULATING HORMONE: FSH: 47 m[IU]/mL

## 2007-10-16 ENCOUNTER — Ambulatory Visit: Payer: Self-pay | Admitting: Internal Medicine

## 2007-10-16 LAB — CONVERTED CEMR LAB
BUN: 7 mg/dL (ref 6–23)
CO2: 30 meq/L (ref 19–32)
Chloride: 101 meq/L (ref 96–112)
Glucose, Bld: 103 mg/dL — ABNORMAL HIGH (ref 70–99)
Potassium: 3.6 meq/L (ref 3.5–5.1)
TSH: 1.44 microintl units/mL (ref 0.35–5.50)

## 2007-10-18 ENCOUNTER — Encounter: Payer: Self-pay | Admitting: Oncology

## 2007-10-18 ENCOUNTER — Ambulatory Visit: Payer: Self-pay

## 2007-10-26 ENCOUNTER — Ambulatory Visit: Payer: Self-pay | Admitting: Internal Medicine

## 2007-11-16 LAB — CYP450

## 2007-11-29 ENCOUNTER — Ambulatory Visit: Payer: Self-pay | Admitting: Internal Medicine

## 2007-12-21 ENCOUNTER — Encounter: Admission: RE | Admit: 2007-12-21 | Discharge: 2007-12-21 | Payer: Self-pay | Admitting: Surgery

## 2007-12-22 ENCOUNTER — Ambulatory Visit (HOSPITAL_BASED_OUTPATIENT_CLINIC_OR_DEPARTMENT_OTHER): Admission: RE | Admit: 2007-12-22 | Discharge: 2007-12-22 | Payer: Self-pay | Admitting: Surgery

## 2007-12-28 ENCOUNTER — Ambulatory Visit: Payer: Self-pay | Admitting: Oncology

## 2008-01-02 LAB — CBC WITH DIFFERENTIAL/PLATELET
Basophils Absolute: 0.1 10*3/uL (ref 0.0–0.1)
Eosinophils Absolute: 0.2 10*3/uL (ref 0.0–0.5)
HCT: 35.6 % (ref 34.8–46.6)
HGB: 11.6 g/dL (ref 11.6–15.9)
LYMPH%: 21.3 % (ref 14.0–48.0)
MONO#: 0.6 10*3/uL (ref 0.1–0.9)
NEUT#: 4.1 10*3/uL (ref 1.5–6.5)
NEUT%: 63.8 % (ref 39.6–76.8)
Platelets: 209 10*3/uL (ref 145–400)
RBC: 3.86 10*6/uL (ref 3.70–5.32)
WBC: 6.5 10*3/uL (ref 3.9–10.0)

## 2008-01-03 LAB — COMPREHENSIVE METABOLIC PANEL
BUN: 13 mg/dL (ref 6–23)
CO2: 23 mEq/L (ref 19–32)
Glucose, Bld: 80 mg/dL (ref 70–99)
Sodium: 141 mEq/L (ref 135–145)
Total Bilirubin: 0.3 mg/dL (ref 0.3–1.2)
Total Protein: 6.9 g/dL (ref 6.0–8.3)

## 2008-01-03 LAB — CANCER ANTIGEN 27.29: CA 27.29: 27 U/mL (ref 0–39)

## 2008-01-03 LAB — LUTEINIZING HORMONE: LH: 22.2 m[IU]/mL

## 2008-01-15 LAB — ESTRADIOL, ULTRA SENS

## 2008-01-16 ENCOUNTER — Ambulatory Visit: Payer: Self-pay | Admitting: Cardiology

## 2008-03-12 ENCOUNTER — Ambulatory Visit: Payer: Self-pay | Admitting: Cardiology

## 2008-03-13 ENCOUNTER — Encounter: Payer: Self-pay | Admitting: Internal Medicine

## 2008-03-13 LAB — CONVERTED CEMR LAB
CO2: 26 meq/L (ref 19–32)
Chloride: 102 meq/L (ref 96–112)
Magnesium: 2.2 mg/dL (ref 1.5–2.5)
Potassium: 3.3 meq/L — ABNORMAL LOW (ref 3.5–5.1)
Pro B Natriuretic peptide (BNP): 37 pg/mL (ref 0.0–100.0)
Sodium: 140 meq/L (ref 135–145)

## 2008-03-15 ENCOUNTER — Ambulatory Visit: Payer: Self-pay | Admitting: Oncology

## 2008-04-11 LAB — CBC WITH DIFFERENTIAL/PLATELET
BASO%: 1.5 % (ref 0.0–2.0)
MCHC: 33.3 g/dL (ref 32.0–36.0)
MONO#: 0.4 10*3/uL (ref 0.1–0.9)
NEUT#: 3.3 10*3/uL (ref 1.5–6.5)
RBC: 4.04 10*6/uL (ref 3.70–5.32)
WBC: 5.7 10*3/uL (ref 3.9–10.0)
lymph#: 1.9 10*3/uL (ref 0.9–3.3)

## 2008-04-12 LAB — COMPREHENSIVE METABOLIC PANEL
ALT: 25 U/L (ref 0–35)
Albumin: 4.6 g/dL (ref 3.5–5.2)
CO2: 23 mEq/L (ref 19–32)
Calcium: 9.2 mg/dL (ref 8.4–10.5)
Chloride: 99 mEq/L (ref 96–112)
Potassium: 4.2 mEq/L (ref 3.5–5.3)
Sodium: 137 mEq/L (ref 135–145)
Total Protein: 7.8 g/dL (ref 6.0–8.3)

## 2008-04-12 LAB — LACTATE DEHYDROGENASE: LDH: 189 U/L (ref 94–250)

## 2008-04-29 DIAGNOSIS — I10 Essential (primary) hypertension: Secondary | ICD-10-CM

## 2008-04-29 DIAGNOSIS — R635 Abnormal weight gain: Secondary | ICD-10-CM | POA: Insufficient documentation

## 2008-04-29 DIAGNOSIS — I951 Orthostatic hypotension: Secondary | ICD-10-CM

## 2008-04-29 DIAGNOSIS — E1159 Type 2 diabetes mellitus with other circulatory complications: Secondary | ICD-10-CM

## 2008-04-29 DIAGNOSIS — E66813 Obesity, class 3: Secondary | ICD-10-CM | POA: Insufficient documentation

## 2008-04-29 DIAGNOSIS — R05 Cough: Secondary | ICD-10-CM

## 2008-04-29 DIAGNOSIS — R209 Unspecified disturbances of skin sensation: Secondary | ICD-10-CM | POA: Insufficient documentation

## 2008-04-29 DIAGNOSIS — R002 Palpitations: Secondary | ICD-10-CM

## 2008-04-29 DIAGNOSIS — R0602 Shortness of breath: Secondary | ICD-10-CM

## 2008-04-29 DIAGNOSIS — R262 Difficulty in walking, not elsewhere classified: Secondary | ICD-10-CM | POA: Insufficient documentation

## 2008-04-29 DIAGNOSIS — R42 Dizziness and giddiness: Secondary | ICD-10-CM

## 2008-04-29 DIAGNOSIS — R0601 Orthopnea: Secondary | ICD-10-CM

## 2008-04-29 DIAGNOSIS — R609 Edema, unspecified: Secondary | ICD-10-CM

## 2008-05-02 ENCOUNTER — Emergency Department (HOSPITAL_COMMUNITY): Admission: EM | Admit: 2008-05-02 | Discharge: 2008-05-02 | Payer: Self-pay | Admitting: Emergency Medicine

## 2008-05-02 ENCOUNTER — Ambulatory Visit: Payer: Self-pay | Admitting: Cardiovascular Disease

## 2008-05-02 ENCOUNTER — Ambulatory Visit: Payer: Self-pay | Admitting: Internal Medicine

## 2008-05-06 ENCOUNTER — Encounter: Admission: RE | Admit: 2008-05-06 | Discharge: 2008-05-06 | Payer: Self-pay | Admitting: Family Medicine

## 2008-05-07 ENCOUNTER — Encounter: Payer: Self-pay | Admitting: Internal Medicine

## 2008-05-07 ENCOUNTER — Ambulatory Visit: Payer: Self-pay

## 2008-05-13 ENCOUNTER — Ambulatory Visit: Payer: Self-pay | Admitting: Internal Medicine

## 2008-06-20 ENCOUNTER — Ambulatory Visit: Payer: Self-pay | Admitting: Cardiology

## 2008-06-20 LAB — CONVERTED CEMR LAB
Calcium: 9.2 mg/dL (ref 8.4–10.5)
GFR calc Af Amer: 179 mL/min
GFR calc non Af Amer: 148 mL/min
Potassium: 4.1 meq/L (ref 3.5–5.1)
Sodium: 137 meq/L (ref 135–145)

## 2008-07-22 ENCOUNTER — Ambulatory Visit: Payer: Self-pay | Admitting: Oncology

## 2008-07-22 ENCOUNTER — Encounter: Admission: RE | Admit: 2008-07-22 | Discharge: 2008-07-22 | Payer: Self-pay | Admitting: Oncology

## 2008-07-25 ENCOUNTER — Ambulatory Visit: Payer: Self-pay | Admitting: Cardiology

## 2008-07-25 ENCOUNTER — Encounter (INDEPENDENT_AMBULATORY_CARE_PROVIDER_SITE_OTHER): Payer: Self-pay | Admitting: Nurse Practitioner

## 2008-07-26 ENCOUNTER — Encounter: Payer: Self-pay | Admitting: Nurse Practitioner

## 2008-08-02 ENCOUNTER — Ambulatory Visit: Payer: Self-pay | Admitting: Internal Medicine

## 2008-08-02 ENCOUNTER — Ambulatory Visit (HOSPITAL_COMMUNITY): Admission: RE | Admit: 2008-08-02 | Discharge: 2008-08-02 | Payer: Self-pay | Admitting: Internal Medicine

## 2008-08-25 DIAGNOSIS — I5022 Chronic systolic (congestive) heart failure: Secondary | ICD-10-CM | POA: Insufficient documentation

## 2008-08-27 ENCOUNTER — Encounter: Payer: Self-pay | Admitting: Internal Medicine

## 2008-08-27 ENCOUNTER — Ambulatory Visit: Payer: Self-pay | Admitting: Internal Medicine

## 2008-09-26 ENCOUNTER — Telehealth (INDEPENDENT_AMBULATORY_CARE_PROVIDER_SITE_OTHER): Payer: Self-pay | Admitting: *Deleted

## 2008-09-26 ENCOUNTER — Encounter (INDEPENDENT_AMBULATORY_CARE_PROVIDER_SITE_OTHER): Payer: Self-pay | Admitting: Nurse Practitioner

## 2008-09-26 ENCOUNTER — Ambulatory Visit: Payer: Self-pay | Admitting: Internal Medicine

## 2008-09-26 LAB — CONVERTED CEMR LAB
Cortisol, Plasma: 10.4 ug/dL
TSH: 0.78 microintl units/mL (ref 0.35–5.50)

## 2008-10-02 ENCOUNTER — Encounter: Payer: Self-pay | Admitting: Nurse Practitioner

## 2008-10-02 ENCOUNTER — Ambulatory Visit: Payer: Self-pay | Admitting: Internal Medicine

## 2008-10-02 DIAGNOSIS — R5383 Other fatigue: Secondary | ICD-10-CM

## 2008-10-02 DIAGNOSIS — R5381 Other malaise: Secondary | ICD-10-CM

## 2008-10-11 ENCOUNTER — Telehealth (INDEPENDENT_AMBULATORY_CARE_PROVIDER_SITE_OTHER): Payer: Self-pay | Admitting: Nurse Practitioner

## 2008-10-21 ENCOUNTER — Ambulatory Visit: Payer: Self-pay | Admitting: Oncology

## 2008-10-23 LAB — CBC WITH DIFFERENTIAL/PLATELET
Basophils Absolute: 0 10*3/uL (ref 0.0–0.1)
Eosinophils Absolute: 0.1 10*3/uL (ref 0.0–0.5)
HGB: 11.7 g/dL (ref 11.6–15.9)
NEUT#: 4.8 10*3/uL (ref 1.5–6.5)
RDW: 13.3 % (ref 11.2–14.5)
lymph#: 2.3 10*3/uL (ref 0.9–3.3)

## 2008-10-24 LAB — LACTATE DEHYDROGENASE: LDH: 183 U/L (ref 94–250)

## 2008-10-24 LAB — VITAMIN D 25 HYDROXY (VIT D DEFICIENCY, FRACTURES): Vit D, 25-Hydroxy: 52 ng/mL (ref 30–89)

## 2008-10-24 LAB — COMPREHENSIVE METABOLIC PANEL
Albumin: 4.4 g/dL (ref 3.5–5.2)
CO2: 26 mEq/L (ref 19–32)
Glucose, Bld: 95 mg/dL (ref 70–99)
Potassium: 4.2 mEq/L (ref 3.5–5.3)
Sodium: 137 mEq/L (ref 135–145)
Total Protein: 7.7 g/dL (ref 6.0–8.3)

## 2008-10-24 LAB — CANCER ANTIGEN 27.29: CA 27.29: 29 U/mL (ref 0–39)

## 2008-10-31 LAB — ESTRADIOL, ULTRA SENS

## 2008-11-01 ENCOUNTER — Encounter: Payer: Self-pay | Admitting: Internal Medicine

## 2008-11-26 ENCOUNTER — Encounter (INDEPENDENT_AMBULATORY_CARE_PROVIDER_SITE_OTHER): Payer: Self-pay | Admitting: *Deleted

## 2008-12-30 ENCOUNTER — Telehealth: Payer: Self-pay | Admitting: Internal Medicine

## 2009-04-01 ENCOUNTER — Ambulatory Visit: Payer: Self-pay | Admitting: Internal Medicine

## 2009-04-01 DIAGNOSIS — I429 Cardiomyopathy, unspecified: Secondary | ICD-10-CM

## 2009-04-02 LAB — CONVERTED CEMR LAB
BUN: 14 mg/dL (ref 6–23)
Chloride: 105 meq/L (ref 96–112)
Creatinine, Ser: 0.8 mg/dL (ref 0.4–1.2)
Glucose, Bld: 119 mg/dL — ABNORMAL HIGH (ref 70–99)

## 2009-04-15 ENCOUNTER — Ambulatory Visit: Payer: Self-pay | Admitting: Oncology

## 2009-05-01 ENCOUNTER — Ambulatory Visit: Payer: Self-pay | Admitting: Cardiology

## 2009-05-01 ENCOUNTER — Ambulatory Visit: Payer: Self-pay

## 2009-05-01 ENCOUNTER — Encounter: Payer: Self-pay | Admitting: Internal Medicine

## 2009-05-01 ENCOUNTER — Ambulatory Visit (HOSPITAL_COMMUNITY): Admission: RE | Admit: 2009-05-01 | Discharge: 2009-05-01 | Payer: Self-pay | Admitting: Internal Medicine

## 2009-05-15 ENCOUNTER — Telehealth: Payer: Self-pay | Admitting: Internal Medicine

## 2009-05-27 ENCOUNTER — Telehealth (INDEPENDENT_AMBULATORY_CARE_PROVIDER_SITE_OTHER): Payer: Self-pay | Admitting: *Deleted

## 2009-05-28 ENCOUNTER — Ambulatory Visit: Payer: Self-pay | Admitting: Oncology

## 2009-05-30 ENCOUNTER — Encounter: Payer: Self-pay | Admitting: Internal Medicine

## 2009-05-30 LAB — CBC WITH DIFFERENTIAL/PLATELET
BASO%: 0.4 % (ref 0.0–2.0)
Basophils Absolute: 0 10*3/uL (ref 0.0–0.1)
Eosinophils Absolute: 0.2 10*3/uL (ref 0.0–0.5)
HCT: 34.6 % — ABNORMAL LOW (ref 34.8–46.6)
HGB: 11.3 g/dL — ABNORMAL LOW (ref 11.6–15.9)
LYMPH%: 24.7 % (ref 14.0–49.7)
MCH: 29.1 pg (ref 25.1–34.0)
MCV: 88.9 fL (ref 79.5–101.0)
NEUT#: 5.7 10*3/uL (ref 1.5–6.5)
lymph#: 2.2 10*3/uL (ref 0.9–3.3)

## 2009-05-31 LAB — COMPREHENSIVE METABOLIC PANEL
ALT: 20 U/L (ref 0–35)
Alkaline Phosphatase: 82 U/L (ref 39–117)
CO2: 28 mEq/L (ref 19–32)
Potassium: 3.7 mEq/L (ref 3.5–5.3)
Sodium: 139 mEq/L (ref 135–145)
Total Bilirubin: 0.3 mg/dL (ref 0.3–1.2)
Total Protein: 7.7 g/dL (ref 6.0–8.3)

## 2009-05-31 LAB — LACTATE DEHYDROGENASE: LDH: 201 U/L (ref 94–250)

## 2009-06-02 ENCOUNTER — Telehealth: Payer: Self-pay | Admitting: Internal Medicine

## 2009-06-10 LAB — ESTRADIOL, ULTRA SENS: Estradiol, Ultra Sensitive: 16 pg/mL

## 2009-06-11 ENCOUNTER — Telehealth (INDEPENDENT_AMBULATORY_CARE_PROVIDER_SITE_OTHER): Payer: Self-pay | Admitting: Physician Assistant

## 2009-06-11 ENCOUNTER — Emergency Department (HOSPITAL_COMMUNITY): Admission: EM | Admit: 2009-06-11 | Discharge: 2009-06-12 | Payer: Self-pay | Admitting: Emergency Medicine

## 2009-06-17 ENCOUNTER — Ambulatory Visit: Payer: Self-pay | Admitting: Cardiology

## 2009-06-17 LAB — CONVERTED CEMR LAB
BUN: 11 mg/dL (ref 6–23)
CO2: 32 meq/L (ref 19–32)
Calcium: 9.2 mg/dL (ref 8.4–10.5)
Creatinine, Ser: 0.7 mg/dL (ref 0.4–1.2)
Pro B Natriuretic peptide (BNP): 18 pg/mL (ref 0.0–100.0)

## 2009-07-21 ENCOUNTER — Ambulatory Visit: Payer: Self-pay | Admitting: Oncology

## 2009-07-22 ENCOUNTER — Ambulatory Visit: Payer: Self-pay | Admitting: Internal Medicine

## 2009-07-22 ENCOUNTER — Telehealth: Payer: Self-pay | Admitting: Internal Medicine

## 2009-07-23 ENCOUNTER — Encounter: Admission: RE | Admit: 2009-07-23 | Discharge: 2009-07-23 | Payer: Self-pay | Admitting: Oncology

## 2009-07-23 LAB — COMPREHENSIVE METABOLIC PANEL
Albumin: 4 g/dL (ref 3.5–5.2)
CO2: 25 mEq/L (ref 19–32)
Calcium: 8.5 mg/dL (ref 8.4–10.5)
Glucose, Bld: 87 mg/dL (ref 70–99)
Potassium: 4 mEq/L (ref 3.5–5.3)
Sodium: 139 mEq/L (ref 135–145)
Total Protein: 7.1 g/dL (ref 6.0–8.3)

## 2009-07-23 LAB — CBC WITH DIFFERENTIAL/PLATELET
Basophils Absolute: 0 10*3/uL (ref 0.0–0.1)
Eosinophils Absolute: 0.2 10*3/uL (ref 0.0–0.5)
HGB: 11.6 g/dL (ref 11.6–15.9)
MONO#: 0.8 10*3/uL (ref 0.1–0.9)
NEUT#: 4.5 10*3/uL (ref 1.5–6.5)
Platelets: 235 10*3/uL (ref 145–400)
RBC: 3.91 10*6/uL (ref 3.70–5.45)
RDW: 15.6 % — ABNORMAL HIGH (ref 11.2–14.5)
WBC: 7.7 10*3/uL (ref 3.9–10.3)

## 2009-07-23 LAB — LACTATE DEHYDROGENASE: LDH: 169 U/L (ref 94–250)

## 2009-07-23 LAB — CANCER ANTIGEN 27.29: CA 27.29: 18 U/mL (ref 0–39)

## 2009-07-29 ENCOUNTER — Ambulatory Visit (HOSPITAL_COMMUNITY): Admission: RE | Admit: 2009-07-29 | Discharge: 2009-07-29 | Payer: Self-pay | Admitting: Internal Medicine

## 2009-07-29 ENCOUNTER — Ambulatory Visit: Payer: Self-pay

## 2009-07-29 ENCOUNTER — Encounter: Payer: Self-pay | Admitting: Internal Medicine

## 2009-07-29 ENCOUNTER — Ambulatory Visit: Payer: Self-pay | Admitting: Cardiology

## 2009-07-29 ENCOUNTER — Ambulatory Visit: Payer: Self-pay | Admitting: Internal Medicine

## 2009-07-30 ENCOUNTER — Ambulatory Visit (HOSPITAL_COMMUNITY): Admission: RE | Admit: 2009-07-30 | Discharge: 2009-07-30 | Payer: Self-pay | Admitting: Internal Medicine

## 2009-07-30 ENCOUNTER — Ambulatory Visit: Payer: Self-pay | Admitting: Internal Medicine

## 2009-07-31 ENCOUNTER — Encounter: Payer: Self-pay | Admitting: Internal Medicine

## 2009-07-31 LAB — CONVERTED CEMR LAB
BUN: 16 mg/dL (ref 6–23)
Basophils Absolute: 0 10*3/uL (ref 0.0–0.1)
Calcium: 8.8 mg/dL (ref 8.4–10.5)
Creatinine, Ser: 0.9 mg/dL (ref 0.4–1.2)
Eosinophils Relative: 1.9 % (ref 0.0–5.0)
GFR calc non Af Amer: 89.61 mL/min (ref >60)
HCT: 39.7 % (ref 36.0–46.0)
INR: 1.2 — ABNORMAL HIGH (ref 0.8–1.0)
Lymphocytes Relative: 10.5 % — ABNORMAL LOW (ref 12.0–46.0)
Monocytes Relative: 3.1 % (ref 3.0–12.0)
Neutrophils Relative %: 83.8 % — ABNORMAL HIGH (ref 43.0–77.0)
Platelets: 131 10*3/uL — ABNORMAL LOW (ref 150.0–400.0)
Potassium: 4.7 meq/L (ref 3.5–5.1)
Prothrombin Time: 12.3 s — ABNORMAL HIGH (ref 9.1–11.7)
RDW: 14.9 % — ABNORMAL HIGH (ref 11.5–14.6)
WBC: 7.1 10*3/uL (ref 4.5–10.5)

## 2009-08-01 ENCOUNTER — Inpatient Hospital Stay (HOSPITAL_BASED_OUTPATIENT_CLINIC_OR_DEPARTMENT_OTHER): Admission: RE | Admit: 2009-08-01 | Discharge: 2009-08-01 | Payer: Self-pay | Admitting: Internal Medicine

## 2009-08-01 ENCOUNTER — Ambulatory Visit: Payer: Self-pay | Admitting: Internal Medicine

## 2009-08-12 ENCOUNTER — Encounter (HOSPITAL_COMMUNITY): Admission: RE | Admit: 2009-08-12 | Discharge: 2009-11-10 | Payer: Self-pay | Admitting: Internal Medicine

## 2009-08-14 ENCOUNTER — Ambulatory Visit: Payer: Self-pay | Admitting: Internal Medicine

## 2009-10-30 ENCOUNTER — Telehealth (INDEPENDENT_AMBULATORY_CARE_PROVIDER_SITE_OTHER): Payer: Self-pay | Admitting: *Deleted

## 2009-11-05 ENCOUNTER — Telehealth: Payer: Self-pay | Admitting: Internal Medicine

## 2009-11-11 ENCOUNTER — Encounter: Payer: Self-pay | Admitting: Internal Medicine

## 2010-01-19 ENCOUNTER — Ambulatory Visit: Payer: Self-pay | Admitting: Oncology

## 2010-01-28 ENCOUNTER — Encounter: Payer: Self-pay | Admitting: Internal Medicine

## 2010-01-28 LAB — CBC WITH DIFFERENTIAL/PLATELET
BASO%: 0.7 % (ref 0.0–2.0)
EOS%: 1.6 % (ref 0.0–7.0)
LYMPH%: 29.6 % (ref 14.0–49.7)
MCH: 29.1 pg (ref 25.1–34.0)
MCHC: 32.4 g/dL (ref 31.5–36.0)
MCV: 89.7 fL (ref 79.5–101.0)
MONO#: 0.8 10*3/uL (ref 0.1–0.9)
MONO%: 9.2 % (ref 0.0–14.0)
NEUT%: 58.9 % (ref 38.4–76.8)
Platelets: 236 10*3/uL (ref 145–400)
RBC: 4.06 10*6/uL (ref 3.70–5.45)
WBC: 8.5 10*3/uL (ref 3.9–10.3)

## 2010-01-29 ENCOUNTER — Telehealth (INDEPENDENT_AMBULATORY_CARE_PROVIDER_SITE_OTHER): Payer: Self-pay | Admitting: *Deleted

## 2010-01-29 LAB — COMPREHENSIVE METABOLIC PANEL
ALT: 53 U/L — ABNORMAL HIGH (ref 0–35)
AST: 63 U/L — ABNORMAL HIGH (ref 0–37)
Alkaline Phosphatase: 68 U/L (ref 39–117)
Creatinine, Ser: 0.82 mg/dL (ref 0.40–1.20)
Sodium: 137 mEq/L (ref 135–145)
Total Bilirubin: 0.6 mg/dL (ref 0.3–1.2)
Total Protein: 7.4 g/dL (ref 6.0–8.3)

## 2010-02-13 ENCOUNTER — Encounter: Admission: RE | Admit: 2010-02-13 | Discharge: 2010-02-13 | Payer: Self-pay | Admitting: Gastroenterology

## 2010-02-19 ENCOUNTER — Encounter: Payer: Self-pay | Admitting: Internal Medicine

## 2010-02-26 ENCOUNTER — Telehealth: Payer: Self-pay | Admitting: Internal Medicine

## 2010-03-09 ENCOUNTER — Emergency Department (HOSPITAL_COMMUNITY): Admission: EM | Admit: 2010-03-09 | Discharge: 2010-03-09 | Payer: Self-pay | Admitting: Emergency Medicine

## 2010-03-18 ENCOUNTER — Ambulatory Visit: Payer: Self-pay | Admitting: Internal Medicine

## 2010-03-23 ENCOUNTER — Ambulatory Visit (HOSPITAL_COMMUNITY): Admission: RE | Admit: 2010-03-23 | Discharge: 2010-03-23 | Payer: Self-pay | Admitting: General Surgery

## 2010-04-15 ENCOUNTER — Encounter: Payer: Self-pay | Admitting: Internal Medicine

## 2010-05-25 ENCOUNTER — Ambulatory Visit: Payer: Self-pay | Admitting: Internal Medicine

## 2010-05-28 ENCOUNTER — Telehealth: Payer: Self-pay | Admitting: Internal Medicine

## 2010-05-30 LAB — CONVERTED CEMR LAB
Chloride: 102 meq/L (ref 96–112)
GFR calc non Af Amer: 119.26 mL/min (ref >60.00)
Potassium: 4.2 meq/L (ref 3.5–5.1)
Pro B Natriuretic peptide (BNP): 55.1 pg/mL (ref 0.0–100.0)
Sodium: 140 meq/L (ref 135–145)

## 2010-06-23 ENCOUNTER — Ambulatory Visit: Admission: RE | Admit: 2010-06-23 | Discharge: 2010-06-23 | Payer: Self-pay | Source: Home / Self Care

## 2010-06-23 ENCOUNTER — Encounter: Payer: Self-pay | Admitting: Internal Medicine

## 2010-06-23 ENCOUNTER — Ambulatory Visit (HOSPITAL_COMMUNITY)
Admission: RE | Admit: 2010-06-23 | Discharge: 2010-06-23 | Payer: Self-pay | Source: Home / Self Care | Attending: Cardiovascular Disease | Admitting: Cardiovascular Disease

## 2010-06-25 ENCOUNTER — Telehealth: Payer: Self-pay | Admitting: Internal Medicine

## 2010-06-26 ENCOUNTER — Encounter (INDEPENDENT_AMBULATORY_CARE_PROVIDER_SITE_OTHER): Payer: Self-pay | Admitting: *Deleted

## 2010-07-01 ENCOUNTER — Encounter (INDEPENDENT_AMBULATORY_CARE_PROVIDER_SITE_OTHER): Payer: Self-pay | Admitting: *Deleted

## 2010-07-04 ENCOUNTER — Other Ambulatory Visit: Payer: Self-pay | Admitting: Oncology

## 2010-07-04 DIAGNOSIS — Z9289 Personal history of other medical treatment: Secondary | ICD-10-CM

## 2010-07-13 ENCOUNTER — Telehealth (INDEPENDENT_AMBULATORY_CARE_PROVIDER_SITE_OTHER): Payer: Self-pay | Admitting: Radiology

## 2010-07-14 ENCOUNTER — Encounter: Payer: Self-pay | Admitting: Cardiology

## 2010-07-14 ENCOUNTER — Encounter (HOSPITAL_COMMUNITY): Admission: RE | Admit: 2010-07-14 | Payer: Self-pay | Source: Home / Self Care | Admitting: Internal Medicine

## 2010-07-14 ENCOUNTER — Ambulatory Visit: Admission: RE | Admit: 2010-07-14 | Discharge: 2010-07-14 | Payer: Self-pay | Source: Home / Self Care

## 2010-07-14 NOTE — Letter (Signed)
Summary: [facility] - Request for Cardiac/ Medical Clearance  [facility] - Request for Cardiac/ Medical Clearance   Imported By: Marylou Mccoy 03/10/2010 09:52:07  _____________________________________________________________________  External Attachment:    Type:   Image     Comment:   External Document

## 2010-07-14 NOTE — Letter (Signed)
[facility]   Imported By: Roderic Ovens 09/02/2009 16:02:24  _____________________________________________________________________  External Attachment:    Type:   Image     Comment:   External Document

## 2010-07-14 NOTE — Progress Notes (Signed)
Summary: pt request call didn't leave any information   Phone Note Call from Patient Call back at Work Phone [phone]   Reason for Call: Talk to Nurse Initial call taken by: Judie Grieve,  February 26, 2010 11:07 AM  Follow-up for Phone Call        spoke w/pt advised we had cleared her for surgery went ahead and made a f/u appt Meredith Staggers, RN  February 26, 2010 1:03 PM

## 2010-07-14 NOTE — Assessment & Plan Note (Signed)
Summary: 12:00  rov  Medications Added DICLOFENAC SODIUM 75 MG TBEC (DICLOFENAC SODIUM) 1 by mouth daily ROBAXIN-750 750 MG TABS (METHOCARBAMOL) 1 by mouth daily      Allergies Added:   Referring Provider:  Bensimhon Primary Provider:  Maurice Small   History of Present Illness: [name] is a 40 year old woman with a history of morbid obesity, depression, fibromyalgia, congestive heart failure secondary to nonischemic cardiomyopathy related to her chemotherapy for breast cancer. Last chem 2008. Still in remission.    CPX test  Feb 2010. Peak VO2 was 14.5 which was 71% of predicted. When corrected for body weight the VO2 was 20.7. The slope was 27. RER 1.20. O2 pulse 73%. Overall this was felt to be only a very mild functional limitation due to her obesity and mild circulatory   Had echo 11/10 which showed EF back down to 25%. So patient underwent repeat echo, CPX and RHC for profound fatigue.  Echo 2/11 showed EF 50% RHC: RA 4 PA 26/9 (16) PCWP 9 Fick CO 5.7/ 2.5    CPX 2/11: pVO2 13.0 (72%) correct for ideal wt 32ml/kg/min RER 1.06 (submax) slope 28.2 O2 pulse 91% - felt no signifcant cardiac limitation. + deconditioning.  Doing better from a cardiac standpoint. Over past few months having problems with gallstones with pain and nausea. Planning laproscopic cholecystectomy with Dr. Andrey Campanile next week.  Edema well controlled with lasix. Takes extra lasix as needed. No orthopnea or PND.     Current Medications (verified): 1)  Carvedilol 12.5 Mg Tabs (Carvedilol) .Marland Kitchen.. 1 1/2 Tablets By Mouth Two Times A Day 2)  Klor-Con M20 20 Meq Cr-Tabs (Potassium Chloride Crys Cr) .... Bid 3)  Lisinopril 10 Mg Tabs (Lisinopril) .... Bid 4)  Celexa 40 Mg Tabs (Citalopram Hydrobromide) .... Qd 5)  Ambien 10 Mg Tabs (Zolpidem Tartrate) .... Prn 6)  Lorazepam 1 Mg Tabs (Lorazepam) .... Bid 7)  Vitamin B Complex .Marland Kitchen.. 1 Once Daily 8)  Flax   Oil (Flaxseed (Linseed)) .... 2 Teaspoons Once Daily 9)  Vitamin  D3 5000 Unit/ml Liqd (Cholecalciferol) .... Gel Cap Take One Tablet By Mouth Once Daily. 10)  Caltrate 600+d 600-400 Mg-Unit Tabs (Calcium Carbonate-Vitamin D) .... 2 Qd 11)  Lyrica 200 Mg Caps (Pregabalin) .Marland Kitchen.. 1 By Mouth Two Times A Day 12)  Femara 2.5 Mg Tabs (Letrozole) .Marland Kitchen.. 1 By Mouth Daily 13)  Trazodone .... As Needed 14)  Prenatal Vitamins 0.8 Mg Tabs (Prenatal Multivit-Min-Fe-Fa) 15)  Torsemide 20 Mg Tabs (Torsemide) .... 2 By Mouth Daily 16)  Oxycodone-Acetaminophen 5-325 Mg Tabs (Oxycodone-Acetaminophen) .... Every 4 Hrs 17)  Spironolactone 25 Mg Tabs (Spironolactone) .... Take 1/2 Tablet By Mouth Daily 18)  Diclofenac Sodium 75 Mg Tbec (Diclofenac Sodium) .Marland Kitchen.. 1 By Mouth Daily 19)  Robaxin-750 750 Mg Tabs (Methocarbamol) .Marland Kitchen.. 1 By Mouth Daily  Allergies (verified): 1)  Reglan (Metoclopramide Hcl)  Past History:  Past Medical History: Last updated: 08/14/2009  1. CHF due to non-ischemic cardiomyopathy, thought to be chemotherapy induced        a. Echo 3/08  EF 20-25%       b. Echo 11/09; EF 50-55%       c. CPX 08/02/08: pVO2 14.5 (71%) corrected to bodyweight 20.7. VeVCo2 27 RER 1.20 O2 pulse 73%       d. Echo 11/10: EF 20-25%       e. Echo 2/11: EF 50%       f. RHC: RA 4 PA 26/9 (16) PCWP 9 Fick CO  5.7/ 2.5          g. CPX 2/11: pVO2 13.0 (72%) correct for ideal wt 19ml/kg/min RER 1.06 (submax) slope 28.2 O2 pulse 91% - felt no signifcant cardiac limitation. + deconditioning.  2. Breast cancer status post mastectomy and chemotherapy, last on       September 2008.   3. Chemotherapy-induced peripheral neuropathy.   4. Hypertension c/b orthostatic hypotension  5. Depression.   6. Palpitations       a. normal sinus rhythm only on 21 day heart monitor  7. Cough  8. OVERWEIGHT/OBESITY (ICD-278.02)  9. Dizziness 10. Fibromyalgia  Review of Systems       As per HPI and past medical history; otherwise all systems negative.   Vital Signs:  Patient profile:   40 year old  female Height:      67 inches Weight:      265 pounds BMI:     41.65 Pulse rate:   99 / minute Resp:     16 per minute BP sitting:   92 / 70  (left arm)  Vitals Entered By: Marrion Coy, CNA (March 18, 2010 3:06 PM)  Physical Exam  General:  Obese.young woman. Flat afect. No resp distress HEENT: normal Neck: supple.thick  no JVD. Carotids 2+ bilat; no bruits. Cor: PMI nonpalpable. Regular rate & rhythm. No rubs, gallops, murmur. Lungs: clear Abdomen: obese soft, mildly tender, nondistended. Good bowel sounds. Extremities: no cyanosis, clubbing, rash, tr edema Neuro: alert & orientedx3, cranial nerves grossly intact. moves all 4 extremities w/o difficulty. affect pleasant    Impression & Recommendations:  Problem # 1:  PRE-OPERATIVE CARDIAC EXAM (ICD-V72.81) Doing well. Ok to proceed with surgery withou any further cardiac work-up.   Problem # 2:  SYSTOLIC HEART FAILURE, CHRONIC (ICD-428.22) EF improved. Volume status looks ok. Will continue to titrate meds after surgery  Problem # 3:  OVERWEIGHT/OBESITY (ICD-278.02) Cautioned her on risks of continued weight gain (she has gained 50 pounds over past year).   Other Orders: EKG w/ Interpretation (93000)  Patient Instructions: 1)  Your physician recommends that you schedule a follow-up appointment in: 2 months with Dr Gala Romney 2)  Your physician recommends that you continue on your current medications as directed. Please refer to the Current Medication list given to you today.

## 2010-07-14 NOTE — Progress Notes (Signed)
   Request for Records recieved from [employer] sent to [employer]. Cala Bradford Mesiemore  Oct 30, 2009 10:57 AM

## 2010-07-14 NOTE — Assessment & Plan Note (Signed)
Summary: post hosp f/u sl  Medications Added LYRICA 200 MG CAPS (PREGABALIN) 1 by mouth two times a day OXYCODONE-ACETAMINOPHEN 5-325 MG TABS (OXYCODONE-ACETAMINOPHEN) every 4 hrs      Allergies Added:   Visit Type:  Follow-up Referring Provider:  Bensimhon Primary Provider:  Maurice Small  CC:  no energy.  History of Present Illness: [name] is a 40 year old woman with a history of morbid obesity, depression, fibromyalgia, congestive heart failure secondary to nonischemic cardiomyopathy related to her chemotherapy for breast cancer. Last chem 2008. Still in remission.    CPX test  Feb 2010. Peak VO2 was 14.5 which was 71% of predicted. When corrected for body weight the VO2 was 20.7. The slope was 27. RER 1.20. O2 pulse 73%. Overall this was felt to be only a very mild functional limitation due to her obesity and mild circulatory   Had echo 11/10 which showed EF back down to 25%. So patient underwent repeat echo, CPX and RHC for profound fatigue.  Echo 2/11 showed EF 50% RHC: RA 4 PA 26/9 (16) PCWP 9 Fick CO 5.7/ 2.5    CPX 2/11: pVO2 13.0 (72%) correct for ideal wt 33ml/kg/min RER 1.06 (submax) slope 28.2 O2 pulse 91% - felt no signifcant cardiac limitation. + deconditioning.  Now feeling better but still very fatigued. No orthopnea, PND. Occasional mild edema. Going to cardiac rehab doing 10 mins each on treadmill, stepper on bike.     Current Medications (verified): 1)  Carvedilol 12.5 Mg Tabs (Carvedilol) .Marland Kitchen.. 1 1/2 Tablets By Mouth Two Times A Day 2)  Klor-Con M20 20 Meq Cr-Tabs (Potassium Chloride Crys Cr) .... Bid 3)  Lisinopril 10 Mg Tabs (Lisinopril) .... Bid 4)  Budeprion Xl 300 Mg Xr24h-Tab (Bupropion Hcl) .... Qd 5)  Celexa 40 Mg Tabs (Citalopram Hydrobromide) .... Qd 6)  Ambien 10 Mg Tabs (Zolpidem Tartrate) .... Prn 7)  Lorazepam 1 Mg Tabs (Lorazepam) .... Bid 8)  Vitamin B Complex .Marland Kitchen.. 1 Once Daily 9)  Flax   Oil (Flaxseed (Linseed)) .... 2 Teaspoons Once  Daily 10)  Vitamin D3 5000 Unit/ml Liqd (Cholecalciferol) .... Gel Cap Take One Tablet By Mouth Once Daily. 11)  Caltrate 600+d 600-400 Mg-Unit Tabs (Calcium Carbonate-Vitamin D) .... 2 Qd 12)  Co Q-10 30 Mg  Caps (Coenzyme Q10) .Marland Kitchen.. 1 Qd 13)  Lyrica 200 Mg Caps (Pregabalin) .Marland Kitchen.. 1 By Mouth Two Times A Day 14)  Femara 2.5 Mg Tabs (Letrozole) .Marland Kitchen.. 1 By Mouth Daily 15)  Aciphex 20 Mg Tbec (Rabeprazole Sodium) .Marland Kitchen.. 1 By Mouth Daily 16)  Abilify 15 Mg Tabs (Aripiprazole) .Marland Kitchen.. 1 By Mouth Daily 17)  Trazodone .... As Needed 18)  Prenatal Vitamins 0.8 Mg Tabs (Prenatal Multivit-Min-Fe-Fa) 19)  Torsemide 20 Mg Tabs (Torsemide) .... 2 By Mouth Daily 20)  Oxycodone-Acetaminophen 5-325 Mg Tabs (Oxycodone-Acetaminophen) .... Every 4 Hrs 21)  Spironolactone 25 Mg Tabs (Spironolactone) .... Take 1/2 Tablet By Mouth Daily  Allergies (verified): 1)  Reglan (Metoclopramide Hcl)  Past History:  Past Medical History:  1. CHF due to non-ischemic cardiomyopathy, thought to be chemotherapy induced        a. Echo 3/08  EF 20-25%       b. Echo 11/09; EF 50-55%       c. CPX 08/02/08: pVO2 14.5 (71%) corrected to bodyweight 20.7. VeVCo2 27 RER 1.20 O2 pulse 73%       d. Echo 11/10: EF 20-25%       e. Echo 2/11: EF 50%  f. RHC: RA 4 PA 26/9 (16) PCWP 9 Fick CO 5.7/ 2.5          g. CPX 2/11: pVO2 13.0 (72%) correct for ideal wt 24ml/kg/min RER 1.06 (submax) slope 28.2 O2 pulse 91% - felt no signifcant cardiac limitation. + deconditioning.  2. Breast cancer status post mastectomy and chemotherapy, last on       September 2008.   3. Chemotherapy-induced peripheral neuropathy.   4. Hypertension c/b orthostatic hypotension  5. Depression.   6. Palpitations       a. normal sinus rhythm only on 21 day heart monitor  7. Cough  8. OVERWEIGHT/OBESITY (ICD-278.02)  9. Dizziness 10. Fibromyalgia  Review of Systems       As per HPI and past medical history; otherwise all systems negative.   Vital  Signs:  Patient profile:   40 year old female Height:      67 inches Weight:      259 pounds BMI:     40.71 Pulse rate:   85 / minute BP sitting:   108 / 66  (left arm) Cuff size:   large  Vitals Entered By: Hardin Negus, RMA (August 14, 2009 11:15 AM)  Physical Exam  General:  Obese.young woman. Flat afect. No resp distress HEENT: normal Neck: supple.thick  no JVD. Carotids 2+ bilat; no bruits. Cor: PMI nonpalpable. Regular rate & rhythm. No rubs, gallops, murmur. Lungs: clear Abdomen: obese soft, nontender, nondistended. Good bowel sounds. Extremities: no cyanosis, clubbing, rash, edema Neuro: alert & orientedx3, cranial nerves grossly intact. moves all 4 extremities w/o difficulty. affect pleasant    Impression & Recommendations:  Problem # 1:  SYSTOLIC HEART FAILURE, CHRONIC (ICD-428.22) Went over all testing with patient and family. Her EF is much better now and no evidence of significant cardiac limitation on CPX. I am unsure of why her EF dropped transiently. Continue current meds and congratulated her on starting exercise program and encourage her to continue. Volume status looks good. Will follow closely.

## 2010-07-14 NOTE — Letter (Signed)
Summary: Dr Mary Sella Wilson's Office Visit Note   Dr Mary Sella Wilson's Office Visit Note   Imported By: Roderic Ovens 03/24/2010 15:30:12  _____________________________________________________________________  External Attachment:    Type:   Image     Comment:   External Document

## 2010-07-14 NOTE — Letter (Signed)
[facility]   Imported By: Roderic Ovens 03/17/2010 15:29:22  _____________________________________________________________________  External Attachment:    Type:   Image     Comment:   External Document

## 2010-07-14 NOTE — Assessment & Plan Note (Signed)
Summary: per check out/sf  Medications Added HYDROCODONE-ACETAMINOPHEN 5-500 MG TABS (HYDROCODONE-ACETAMINOPHEN) 2 every 4 hrs as needed      Allergies Added:   Referring Provider:  Bensimhon Primary Provider:  Maurice Small  CC:  tired; weak; fibromyalgia acting up.  History of Present Illness: [name] is a 40 year old woman with a history of morbid obesity, depression, fibromyalgia, congestive heart failure secondary to nonischemic cardiomyopathy related to her chemotherapy for breast cancer. Last chem 2008. Still in remission.  Ejection fraction irecovered intially but most recent ECHO shows EF now depressed again at 25% (Nov 21010).  Last CPX test was on Feb 2010. Peak VO2 was 14.5 which was 71% of predicted. When corrected for body weight the VO2 was 20.7. The slope was 27. RER 1.20. O2 pulse 73%. Overall this was felt to be only a very mild functional limitation due to her obesity and mild circulatory limitation.  Dr. Titus Dubin also diagnosed her with Fibromyalgia and started her on Lyrica. Oncology was concerned wght gain was due to Tamoxifen and d/c's and started her on Femara.  Seen by Dorian Pod last month and coreg titrated up.   Returns   Myan has  gained approx. 20 pounds since last fall. Seen in ER last week when Lasix didn't seem to work and received IV lasix and increased Lasix to 80mg  am/40mg  evening. Dawn states this seemed to help some continues to feel bloated and dyspneic but has been able to walk on treadmill for 15 minutes daily.She has also made changes in her diet : no fried foods, sweets and junk food.   Returns today for f/u. Depressed about results of ECHO. I had spoke with [name] by phone and increased pm dose of coreg, she has tolerated this dose without dizziness/presyncope.    Current Medications (verified): 1)  Carvedilol 12.5 Mg Tabs (Carvedilol) .Marland Kitchen.. 1 1/2 Tablets By Mouth Two Times A Day 2)  Klor-Con M20 20 Meq Cr-Tabs (Potassium Chloride  Crys Cr) .... Bid 3)  Lisinopril 10 Mg Tabs (Lisinopril) .... Bid 4)  Budeprion Xl 300 Mg Xr24h-Tab (Bupropion Hcl) .... Qd 5)  Celexa 40 Mg Tabs (Citalopram Hydrobromide) .... Qd 6)  Ambien 10 Mg Tabs (Zolpidem Tartrate) .... Prn 7)  Lorazepam 1 Mg Tabs (Lorazepam) .... Bid 8)  Vitamin B Complex .Marland Kitchen.. 1 Once Daily 9)  Flax   Oil (Flaxseed (Linseed)) .... 2 Teaspoons Once Daily 10)  Vitamin D3 5000 Unit/ml Liqd (Cholecalciferol) .... Gel Cap Take One Tablet By Mouth Once Daily. 11)  Caltrate 600+d 600-400 Mg-Unit Tabs (Calcium Carbonate-Vitamin D) .... 2 Qd 12)  Co Q-10 30 Mg  Caps (Coenzyme Q10) .Marland Kitchen.. 1 Qd 13)  Lyrica 200 Mg Caps (Pregabalin) .Marland Kitchen.. 1 By Mouth Daily 14)  Femara 2.5 Mg Tabs (Letrozole) .Marland Kitchen.. 1 By Mouth Daily 15)  Aciphex 20 Mg Tbec (Rabeprazole Sodium) .Marland Kitchen.. 1 By Mouth Daily 16)  Abilify 15 Mg Tabs (Aripiprazole) .Marland Kitchen.. 1 By Mouth Daily 17)  Trazodone .... As Needed 18)  Prenatal Vitamins 0.8 Mg Tabs (Prenatal Multivit-Min-Fe-Fa) 19)  Torsemide 20 Mg Tabs (Torsemide) .... 2 By Mouth Daily 20)  Hydrocodone-Acetaminophen 5-500 Mg Tabs (Hydrocodone-Acetaminophen) .... 2 Every 4 Hrs As Needed  Allergies (verified): 1)  Reglan (Metoclopramide Hcl)  Past History:  Past Medical History:  1. CHF due to non-ischemic cardiomyopathy, thought to be chemotherapy induced        a. Echo 3/08  EF 20-25%       b. Echo 11/09; EF 50-55%  c. CPX 08/02/08: pVO2 14.5 (71%) corrected to bodyweight 20.7. VeVCo2 27 RER 1.20 O2 pulse 73%  2. Breast cancer status post mastectomy and chemotherapy, last on       September 2008.   3. Chemotherapy-induced peripheral neuropathy.   4. Hypertension c/b orthostatic hypotension  5. Depression.   6. Palpitations       a. normal sinus rhythm only on 21 day heart monitor  7. Cough  8. OVERWEIGHT/OBESITY (ICD-278.02)  9. Dizziness 10. Fibromyalgia  Review of Systems       As per HPI and past medical history; otherwise all systems  negative.   Vital Signs:  Patient profile:   40 year old female Height:      67 inches Weight:      260 pounds BMI:     40.87 Pulse rate:   88 / minute BP sitting:   108 / 76  (left arm) Cuff size:   large  Vitals Entered By: Hardin Negus, RMA (July 22, 2009 11:34 AM)  Physical Exam  General:  Obese.young woman. Flat afect. No resp distress HEENT: normal Neck: supple.thick  no JVD. Carotids 2+ bilat; no bruits. Cor: PMI nonpalpable. Regular rate & rhythm. No rubs, gallops, murmur. Lungs: clear Abdomen: obese soft, nontender, nondistended. Good bowel sounds. Extremities: no cyanosis, clubbing, rash, edema Neuro: alert & orientedx3, cranial nerves grossly intact. moves all 4 extremities w/o difficulty. affect pleasant    Impression & Recommendations:  Problem # 1:  SYSTOLIC HEART FAILURE, CHRONIC (ICD-428.22) This is a very difficult situation. Even when her ejection fractionhad normalized she still had severe fatigue and dyspnea which seemed to be more a factor of her deression and fibromyagia. Now her EF is back down and she continues to c/o NYHA IIIB symptomatology. I told her and her family that making the decision to proceed with advanced therapies is one we cannot take lightl ad I feel the onus is upon [place] to determine if the majority of her symptoms are cardiac or not. To that end we will repeat her echo and CPX test and also procceed with a right heart cath. Add spironolactone 12.5 once daily. We will meet back in several weeks to discuss the results.   Other Orders: EKG w/ Interpretation (93000) Cardiac Catheterization (Cardiac Cath) [facility]X Test at [facility] (CPX Test) Echocardiogram (Echo)  Patient Instructions: 1)  Your physician has requested that you have an echocardiogram.  Echocardiography is a painless test that uses sound waves to create images of your heart. It provides your doctor with information about the size and shape of your heart and how well  your heart's chambers and valves are working.  This procedure takes approximately one hour. There are no restrictions for this procedure. 2)  Your physician has recommended that you have a cardiopulmonary stress test (CPX).  CPX testing is a non-invasive measurement of heart and lung function. It replaces a traditional treadmill stress test. This type of test provides a tremendous amount of information that relates not only to your present condition but also for future outcomes.  This test combines measurements of your ventilation, respiratory gas exchange in the lungs, electrocardiogram (EKG), blood pressure and physical response before, during, and following an exercise protocol. 3)  Return for labs next week (bmet, cbc, pt 428.22) 4)  I will send your info to Cardiac Rehab they will contact you to schedule an appointment 5)  Follow up in 3-4 weeks

## 2010-07-14 NOTE — Letter (Signed)
Summary: Cardiac Catheterization Instructions- [facility]  [employer], Main Office  [address]   [place], [place] [address]   Phone: [phone]  Fax: [phone]     07/22/2009 MRN: [id number]  [facility] [address] [place], [place]  [address]  Dear Ms. Perella,   You are scheduled for a Cardiac Catheterization on Friday 08/01/09 with Dr.Bensimhon  Please arrive to the 1st floor of the Heart and Vascular Center at [facility] at 10:00 am / pm on the day of your procedure. Please do not arrive before 6:30 a.m. Call the Heart and Vascular Center at [phone] if you are unable to make your appointmnet. The Code to get into the parking garage under the building is 0900. Take the elevators to the 1st floor. You must have someone to drive you home. Someone must be with you for the first 24 hours after you arrive home. Please wear clothes that are easy to get on and off and wear slip-on shoes. Do not eat or drink after midnight except water with your medications that morning. Bring all your medications and current insurance cards with you.  _X__ DO NOT take these medications before your procedure:   Torsemide  ___ Make sure you take your aspirin.  ___ You may take ALL of your medications with water that morning. ________________________________________________________________________________________________________________________________  ___ DO NOT take ANY medications before your procedure.  ___ Pre-med instructions:  ________________________________________________________________________________________________________________________________  The usual length of stay after your procedure is 2 to 3 hours. This can vary.  If you have any questions, please call the office at the number listed above.   Meredith Staggers, RN

## 2010-07-14 NOTE — Letter (Signed)
[facility]   Imported By: Roderic Ovens 07/11/2009 16:32:55  _____________________________________________________________________  External Attachment:    Type:   Image     Comment:   External Document

## 2010-07-14 NOTE — Progress Notes (Signed)
Summary: disability forms   Phone Note Other Incoming   Summary of Call: received disability forms from [employer] for Dr Gala Romney to sign, per Dr Gala Romney pts EF is recoverd and she does not qualify for disability, called pt and explained to her she said she out of work and these forms are irrelevant and can be trashed Initial call taken by: Meredith Staggers, RN,  Nov 05, 2009 3:42 PM

## 2010-07-14 NOTE — Letter (Signed)
Summary: [facility]  [facility]   Imported By: Marylou Mccoy 08/25/2009 11:41:36  _____________________________________________________________________  External Attachment:    Type:   Image     Comment:   External Document

## 2010-07-14 NOTE — Progress Notes (Signed)
Summary: pt didn't get rx,   Medications Added SPIRONOLACTONE 25 MG TABS (SPIRONOLACTONE) Take 1/2 tablet by mouth daily       Phone Note Call from Patient Call back at Home Phone [phone]   Caller: Patient Reason for Call: Talk to Nurse Details for Reason: Per pt calling pt was just seen today, pt didn't get her rx. please call pt back, also [facility] in[facility] [address] [phone] Initial call taken by: Lorne Skeens,  July 22, 2009 2:29 PM  Follow-up for Phone Call        spoke w/pt, she states DB was going to start her on new med and talked about increasing her coreg, he didn't tell me about changes will check w/him tom and give pt call back then, her cell number is [phone] Meredith Staggers, RN  July 22, 2009 2:42 PM   per Dr Gala Romney start spironolactone 12.5mg  once daily, check labs in 1 week, keep coreg dose same for now, rx sent in, left mess on pts cell # Meredith Staggers, RN  July 23, 2009 5:45 PM     New/Updated Medications: SPIRONOLACTONE 25 MG TABS (SPIRONOLACTONE) Take 1/2 tablet by mouth daily Prescriptions: SPIRONOLACTONE 25 MG TABS (SPIRONOLACTONE) Take 1/2 tablet by mouth daily  #15 x 6   Entered by:   Meredith Staggers, RN   Authorized by:   Dolores Patty, MD, [facility]   Signed by:   Meredith Staggers, RN on 07/23/2009   Method used:   [facility]  [address] [phone]* (retail)       [address]       [place], [place]  [id number]       Ph: [phone] or [phone]       Fax: [phone]   RxID:   [id number]

## 2010-07-14 NOTE — Progress Notes (Signed)
   Request from the [facility] sent to [employer]. [name]  January 29, 2010 10:38 AM     Appended Document:  Request recieved from the [facility] sent to [employer]

## 2010-07-14 NOTE — Assessment & Plan Note (Signed)
Summary: ROV PER PT CALL/LG  Medications Added CARVEDILOL 12.5 MG TABS (CARVEDILOL) 1 1/2 tablets by mouth two times a day LYRICA 200 MG CAPS (PREGABALIN) 1 by mouth daily FEMARA 2.5 MG TABS (LETROZOLE) 1 by mouth daily MULTIVITAMINS   TABS (MULTIPLE VITAMIN) 1 by mouth daily ACIPHEX 20 MG TBEC (RABEPRAZOLE SODIUM) 1 by mouth daily ABILIFY 15 MG TABS (ARIPIPRAZOLE) 1 by mouth daily * TRAZODONE as needed PRENATAL VITAMINS 0.8 MG TABS (PRENATAL MULTIVIT-MIN-FE-FA) 1 by mouth daily PRENATAL VITAMINS 0.8 MG TABS (PRENATAL MULTIVIT-MIN-FE-FA)  TORSEMIDE 20 MG TABS (TORSEMIDE) 2 by mouth daily      Allergies Added:    [facility] Primary Cardiologist:  Arvilla Meres, MD NYHA Class:  II     CHF Etiology:  Nonischemic Patient Status:  Active CHF Visit Date 06/17/2009   Alcohol Use no Tobacco Use never Drug Use no BNP  25.0 Weight:  265 TSH:  0.78    Referring Provider:  Bensimhon Primary Provider:  Maurice Small   History of Present Illness: [name] is a 40 year old woman with a history of congestive heart failure secondary to nonischemic cardiomyopathy related to her chemotherapy for breast cancer. Ejection fraction irecovered intially but most recent ECHO shows EF now depressed  again at 25% (Nov 21010).Marland Kitchen   Despite normalization of her cardiac function she is continue to be troubled by extreme fatigue. Dawn had a  CPX test. this was done Feb 2010. Peak VO2 was 14.5 which was 71% of predicted. When corrected for body weight the VO2 was 20.7. The slope was 27. RER 1.20. O2 pulse 73%. Overall this was felt to be only a very mild functional limitation due to her obesity and mild circulatory limitation.  Dr. Titus Dubin also diagnosed her with Fibromyalgia and started her on Lyrica. Oncology was concerned wght gain was due to Tamoxifen and d/c's and started her on Femara.  [name] has  gained approx. 20 pounds since last fall. Seen in ER last week when Lasix didn't seem to work  and received IV lasix and increased Lasix to 80mg  am/40mg  evening. Dawn states this seemed to help some continues to feel bloated and dyspneic but has been able to walk on treadmill for 15 minutes daily.She has also made changes in her diet : no fried foods, sweets and junk food.   Returns today for f/u. Depressed about results of ECHO. I had spoke with [name] by phone and increased pm dose of coreg, she has tolerated this dose without dizziness/presyncope.    CHF History:      She complains of dyspnea with exertion, orthopnea, and peripheral edema.  Daily weights are being checked.  She understands fluid management and sodium restriction and is following this regimen.  The patient expresses understanding of the treatment plan and her medications.  She admits to being compliant with her medications.  ADL's are being done without symptoms or restrictions.  The patient is enrolled in (or has completed) the CHF Eduction Program.  She denies any side effects from her medications.    Problems Prior to Update: 1)  Cardiomyopathy, Secondary  (ICD-425.9) 2)  Fatigue / Malaise  (ICD-780.79) 3)  Systolic Heart Failure, Chronic  (ICD-428.22) 4)  Hypertension, Benign  (ICD-401.1) 5)  Hypotension, Orthostatic  (ICD-458.0) 6)  Difficulty in Walking  (ICD-719.7) 7)  Cough  (ICD-786.2) 8)  Palpitations  (ICD-785.1) 9)  Overweight/obesity  (ICD-278.02) 10)  Orthostatic Hypotension  (ICD-458.0) 11)  Orthopnea  (ICD-786.02) 12)  Numbness  (ICD-782.0) 13)  Edema  (  ICD-782.3) 14)  Dyspnea  (ICD-786.05) 15)  Dizziness  (ICD-780.4) 16)  Abnormal Weight Gain  (ICD-783.1) 17)  Systolic Heart Failure, Acute On Chronic  (ICD-428.23)  Current Medications (verified): 1)  Carvedilol 12.5 Mg Tabs (Carvedilol) .Marland Kitchen.. 1 Qam and 3/4 At Bedtime 2)  Klor-Con M20 20 Meq Cr-Tabs (Potassium Chloride Crys Cr) .... Bid 3)  Lisinopril 10 Mg Tabs (Lisinopril) .... Bid 4)  Budeprion Xl 300 Mg Xr24h-Tab (Bupropion Hcl) .... Qd 5)   Celexa 40 Mg Tabs (Citalopram Hydrobromide) .... Qd 6)  Furosemide 40 Mg Tabs (Furosemide) .... Take One Tablet By Mouth Two Times A Day 7)  Ambien 10 Mg Tabs (Zolpidem Tartrate) .... Prn 8)  Lorazepam 1 Mg Tabs (Lorazepam) .... Bid 9)  Vitamin B Complex .Marland Kitchen.. 1 Once Daily 10)  Flax   Oil (Flaxseed (Linseed)) .... 2 Teaspoons Once Daily 11)  Vitamin D3 5000 Unit/ml Liqd (Cholecalciferol) .... Gel Cap Take One Tablet By Mouth Once Daily. 12)  Caltrate 600+d 600-400 Mg-Unit Tabs (Calcium Carbonate-Vitamin D) .... 2 Qd 13)  Co Q-10 30 Mg  Caps (Coenzyme Q10) .Marland Kitchen.. 1 Qd 14)  Lyrica 200 Mg Caps (Pregabalin) .Marland Kitchen.. 1 By Mouth Daily 15)  Femara 2.5 Mg Tabs (Letrozole) .Marland Kitchen.. 1 By Mouth Daily 16)  Multivitamins   Tabs (Multiple Vitamin) .Marland Kitchen.. 1 By Mouth Daily 17)  Aciphex 20 Mg Tbec (Rabeprazole Sodium) .Marland Kitchen.. 1 By Mouth Daily 18)  Abilify 15 Mg Tabs (Aripiprazole) .Marland Kitchen.. 1 By Mouth Daily 19)  Trazodone .... As Needed  Allergies (verified): 1)  Reglan (Metoclopramide Hcl)  Past History:  Past Medical History: Last updated: 08/27/2008  1. CHF due to non-ischemic cardiomyopathy, thought to be chemotherapy induced        a. Echo 3/08  EF 20-25%       b. Echo 11/09; EF 50-55%       c. CPX 08/02/08: pVO2 14.5 (71%) corrected to bodyweight 20.7. VeVCo2 27 RER 1.20 O2 pulse 73%  2. Breast cancer status post mastectomy and chemotherapy, last on       September 2008.   3. Chemotherapy-induced peripheral neuropathy.   4. Hypertension c/b orthostatic hypotension  5. Depression.   6. Palpitations       a. normal sinus rhythm only on 21 day heart monitor  7. Cough  8. OVERWEIGHT/OBESITY (ICD-278.02)  9. Dizziness  Family History: Last updated: 08/25/2008 Positive for CAD & heart attacks in grandmother and uncles.   Risk Factors: Exercise: yes (04/29/2008)  Family History: Reviewed history from 08/25/2008 and no changes required. Positive for CAD & heart attacks in grandmother and uncles.   Social  History: Reviewed history from 04/29/2008 and no changes required. Married  Tobacco Use - No.  Alcohol Use - no Regular Exercise - yes Drug Use - no  Review of Systems       All other system negative except as mentioned in history and problem list   Physical Exam  General:  Gen: NAD HEENT: normal Neck: supple. no JVD. Carotids 2+ bilat; no bruits. ?goiter  Cor: PMI nondisplaced. Regular rate & rhythm. No rubs, gallops, murmur. Lungs: clear Abdomen: obese. soft, nontender, nondistended. Good bowel sounds. Extremities: no cyanosis, clubbing, rash, edema Neuro: alert & orientedx3, cranial nerves grossly intact. moves all 4 extremities w/o difficulty.      Vital Signs:  Patient profile:   40 year old female Height:      67 inches Weight:      265 pounds BMI:  41.65 Pulse rate:   87 / minute Resp:     16 per minute BP sitting:   128 / 76  (right arm)  Vitals Entered By: Marrion Coy, CNA (June 17, 2009 3:38 PM)  Impression & Recommendations:  Problem # 1:  SYSTOLIC HEART FAILURE, CHRONIC (ICD-428.22) Poor response to lasix(per pts report) will change to demadex 40mg  dialy and follow up. Increase coreg to 18.375mg  by mouth two times a day. Can continue to exercise on treadmill (flat no incline) as tolerated. Support diet changes for wght loss.  Orders: TLB-BNP (B-Natriuretic Peptide) (83880-BNPR) TLB-BMP (Basic Metabolic Panel-BMET) (80048-METABOL)  Problem # 2:  HYPERTENSION, BENIGN (ICD-401.1)  stable  Problem # 3:  ABNORMAL WEIGHT GAIN (ICD-783.1) suspect combination of medications, depression and poor diet choices. exercise and diet changes as stated.  CHF Assessment/Plan:      The patient's current weight is 265 pounds.  Her previous weight was 242 pounds.  Her last cardiac ejection fraction was 50 % on 05/07/2008.  The CHF education was provided to the patient and significant other.    Patient Instructions: 1)  Your physician recommends that you  schedule a follow-up appointment in: 4 weeks with Bensimhon 2)  Your physician has recommended you make the following change in your medication: STOP lasix, start Demadex 20mg  2 by mouth daily, Increase Coreg 12.5mg  1 1/2 tablets by mouth two times a day , Start Multivitamin Prescriptions: TORSEMIDE 20 MG TABS (TORSEMIDE) 2 by mouth daily  #60 x 6   Entered by:   Marrion Coy, CNA   Authorized by:   Dorian Pod, NP-C   Signed by:   Marrion Coy, CNA on 06/17/2009   Method used:   Electronically to        [facility]  [address] [phone]* (retail)       [address]       [place], [place]  [address]       Ph: [phone] or [phone]       Fax: [phone]   RxID:   [id number] CARVEDILOL 12.5 MG TABS (CARVEDILOL) 1 1/2 tablets by mouth two times a day  #90 x 6   Entered by:   Marrion Coy, CNA   Authorized by:   Dorian Pod, NP-C   Signed by:   Marrion Coy, CNA on 06/17/2009   Method used:   [facility]ronically to        [facility]  [address] [phone]* (retail)       [address]       [place], [place]  [address]       Ph: [phone] or [phone]       Fax: [phone]   RxID:   [id number] PRENATAL VITAMINS 0.8 MG TABS (PRENATAL MULTIVIT-MIN-FE-FA) 1 by mouth daily  #30 x 5   Entered by:   Marrion Coy, CNA   Authorized by:   Dorian Pod, NP-C   Signed by:   Marrion Coy, CNA on 06/17/2009   Method used:   [facility]        [facility] [phone]* (retail)       [address]       [place], [place]  [address]       Ph: [phone] or [phone]       Fax: [phone]   RxID:  [id number]   

## 2010-07-14 NOTE — Cardiovascular Report (Signed)
Summary: Pre Cath Orders  Pre Cath Orders   Imported By: Roderic Ovens 07/30/2009 14:15:53  _____________________________________________________________________  External Attachment:    Type:   Image     Comment:   External Document

## 2010-07-14 NOTE — Miscellaneous (Signed)
Summary: Progress Note  Progress Note   Imported By: Dixie Dials 12/03/2009 14:36:33  _____________________________________________________________________  External Attachment:    Type:   Image     Comment:   External Document

## 2010-07-16 NOTE — Progress Notes (Signed)
Summary: PT CALLING RE NEW MED SHE NEEDS  Medications Added KLOR-CON M20 20 MEQ CR-TABS (POTASSIUM CHLORIDE CRYS CR) Take 1 tablet by mouth two times a day       Phone Note Call from Patient   Caller: Patient [phone] Reason for Call: Talk to Nurse Summary of Call: PT CALLING RE NEW MED SHE NEEDS Initial call taken by: Glynda Jaeger,  May 28, 2010 4:51 PM  Follow-up for Phone Call        pt needed prescription for potassium, rx sent in [facility], RN  May 28, 2010 5:47 PM     New/Updated Medications: KLOR-CON M20 20 MEQ CR-TABS (POTASSIUM CHLORIDE CRYS CR) Take 1 tablet by mouth two times a day Prescriptions: KLOR-CON M20 20 MEQ CR-TABS (POTASSIUM CHLORIDE CRYS CR) Take 1 tablet by mouth two times a day  #60 x 6   Entered by:   Meredith Staggers, RN   Authorized by:   Dolores Patty, MD, [facility]   Signed by:   Meredith Staggers, RN on 05/28/2010   Method used:   [facility] to        [facility]  [address] [phone]* (retail)       [address]       [place], [place]  [id number]       Ph: [phone] or [phone]       Fax: [phone]   RxID:   [id number]

## 2010-07-16 NOTE — Letter (Signed)
Summary: Appointment - Reschedule  [facility], Main Office  [address]   [place], [place] [address]   Phone: [phone]  Fax: [phone]     June 26, 2010 MRN: [id number]   [facility] [address] [place], [place]  [address]   Dear Ms. Wyka,   Due to a change in our office schedule, your appointment on  March 20,2012  at 11:15  must be changed.  It is very important that we reach you to reschedule this appointment. We look forward to participating in your health care needs. Please contact us at the number listed above at your earliest convenience to reschedule this appointment.     Sincerely,  Artist

## 2010-07-16 NOTE — Assessment & Plan Note (Signed)
Summary: 2 month rov.sl  Medications Added OXYCODONE-ACETAMINOPHEN 5-325 MG TABS (OXYCODONE-ACETAMINOPHEN) four times a day      Allergies Added:   Visit Type:  Follow-up Referring Provider:  Bensimhon Primary Provider:  Maurice Small  CC:  no compliants.  History of Present Illness: [name] is a 40 year old woman with a history of morbid obesity, depression, fibromyalgia, congestive heart failure secondary to nonischemic cardiomyopathy related to her chemotherapy for breast cancer. Last chem 2008. Still in remission.    CPX test  Feb 2010. Peak VO2 was 14.5 which was 71% of predicted. When corrected for body weight the VO2 was 20.7. The slope was 27. RER 1.20. O2 pulse 73%. Overall this was felt to be only a very mild functional limitation due to her obesity and mild circulatory   Had echo 11/10 which showed EF back down to 25%. So patient underwent repeat echo, CPX and RHC for profound fatigue.  Echo 2/11 showed EF 50% RHC: RA 4 PA 26/9 (16) PCWP 9 Fick CO 5.7/ 2.5    CPX 2/11: pVO2 13.0 (72%) correct for ideal wt 62ml/kg/min RER 1.06 (submax) slope 28.2 O2 pulse 91% - felt no signifcant cardiac limitation. + deconditioning.  Here for routine f/u. Had lap chole in October without problem. Except found to be DM2.   Doing well from a cardiac standpoint. Walking on track 1 mile every day. No CP or SOB. Has lost 12 pounds. Edema well controlled with lasix. Takes extra lasix as needed. No orthopnea or PND. No problems with meds.     Current Medications (verified): 1)  Carvedilol 12.5 Mg Tabs (Carvedilol) .Marland Kitchen.. 1 1/2 Tablets By Mouth Two Times A Day 2)  Klor-Con M20 20 Meq Cr-Tabs (Potassium Chloride Crys Cr) .... Bid 3)  Lisinopril 10 Mg Tabs (Lisinopril) .... Bid 4)  Ambien 10 Mg Tabs (Zolpidem Tartrate) .... Prn 5)  Lorazepam 1 Mg Tabs (Lorazepam) .... Bid 6)  Vitamin B Complex .Marland Kitchen.. 1 Once Daily 7)  Vitamin D3 5000 Unit/ml Liqd (Cholecalciferol) .... Gel Cap Take One Tablet By  Mouth Once Daily. 8)  Lyrica 200 Mg Caps (Pregabalin) .Marland Kitchen.. 1 By Mouth Two Times A Day 9)  Femara 2.5 Mg Tabs (Letrozole) .Marland Kitchen.. 1 By Mouth Daily 10)  Trazodone .... As Needed 11)  Prenatal Vitamins 0.8 Mg Tabs (Prenatal Multivit-Min-Fe-Fa) 12)  Torsemide 20 Mg Tabs (Torsemide) .... 2 By Mouth Daily 13)  Oxycodone-Acetaminophen 5-325 Mg Tabs (Oxycodone-Acetaminophen) .... Four Times A Day 14)  Spironolactone 25 Mg Tabs (Spironolactone) .... Take 1/2 Tablet By Mouth Daily 15)  Diclofenac Sodium 75 Mg Tbec (Diclofenac Sodium) .Marland Kitchen.. 1 By Mouth Daily 16)  Robaxin-750 750 Mg Tabs (Methocarbamol) .Marland Kitchen.. 1 By Mouth Daily  Allergies (verified): 1)  Reglan (Metoclopramide Hcl)  Past History:  Past Medical History: Last updated: 08/14/2009  1. CHF due to non-ischemic cardiomyopathy, thought to be chemotherapy induced        a. Echo 3/08  EF 20-25%       b. Echo 11/09; EF 50-55%       c. CPX 08/02/08: pVO2 14.5 (71%) corrected to bodyweight 20.7. VeVCo2 27 RER 1.20 O2 pulse 73%       d. Echo 11/10: EF 20-25%       e. Echo 2/11: EF 50%       f. RHC: RA 4 PA 26/9 (16) PCWP 9 Fick CO 5.7/ 2.5          g. CPX 2/11: pVO2 13.0 (72%) correct for ideal wt 42ml/kg/min  RER 1.06 (submax) slope 28.2 O2 pulse 91% - felt no signifcant cardiac limitation. + deconditioning.  2. Breast cancer status post mastectomy and chemotherapy, last on       September 2008.   3. Chemotherapy-induced peripheral neuropathy.   4. Hypertension c/b orthostatic hypotension  5. Depression.   6. Palpitations       a. normal sinus rhythm only on 21 day heart monitor  7. Cough  8. OVERWEIGHT/OBESITY (ICD-278.02)  9. Dizziness 10. Fibromyalgia  Review of Systems       As per HPI and past medical history; otherwise all systems negative.   Vital Signs:  Patient profile:   40 year old female Height:      67 inches Weight:      253 pounds BMI:     39.77 Pulse rate:   60 / minute BP sitting:   94 / 62  (left arm) Cuff size:    large  Vitals Entered By: Hardin Negus, RMA (May 25, 2010 12:19 PM)  Physical Exam  General:  Obese.young woman. No resp distress HEENT: normal Neck: supple.thick  no JVD. Carotids 2+ bilat; no bruits. Cor: PMI nonpalpable. Regular rate & rhythm. No rubs, gallops, murmur. Lungs: clear Abdomen: obese soft, mildly tender, nondistended. Good bowel sounds. Extremities: no cyanosis, clubbing, rash, tr edema Neuro: alert & orientedx3, cranial nerves grossly intact. moves all 4 extremities w/o difficulty. affect pleasant    Impression & Recommendations:  Problem # 1:  SYSTOLIC HEART FAILURE, CHRONIC (ICD-428.22) Doing very well. EF normalized. NYHA I-II. Volume status looks good. BP borderline. Will continue current regimen for now. Due for repeat echo and BMET/BNP. Encouraged to continue walking program and diet.   Other Orders: Echocardiogram (Echo) TLB-BMP (Basic Metabolic Panel-BMET) (80048-METABOL) TLB-BNP (B-Natriuretic Peptide) (83880-BNPR)  Patient Instructions: 1)  Labs today 2)  Your physician has requested that you have an echocardiogram.  Echocardiography is a painless test that uses sound waves to create images of your heart. It provides your doctor with information about the size and shape of your heart and how well your heart's chambers and valves are working.  This procedure takes approximately one hour. There are no restrictions for this procedure. 3)  Follow up in 3 months.

## 2010-07-16 NOTE — Letter (Signed)
Summary: [facility] - Office Visit  [facility] - Office Visit   Imported By: Marylou Mccoy 06/10/2010 18:35:11  _____________________________________________________________________  External Attachment:    Type:   Image     Comment:   External Document

## 2010-07-16 NOTE — Progress Notes (Signed)
Summary: lab results   Phone Note Call from Patient Call back at Home Phone [phone] Call back at [phone]   Caller: Patient Reason for Call: Lab or Test Results Initial call taken by: Judie Grieve,  June 25, 2010 10:09 AM  Follow-up for Phone Call        Dr Gala Romney  wants to review echo himself, have called pt and left mess Dr Gala Romney will review next week and will call her then w/results Meredith Staggers, RN  June 25, 2010 6:33 PM   Dr Gala Romney reviewed echo feels EF around 40% same as prev. echo, would like pt to have a MUGA to clarify, have called pt and Left message to call back Meredith Staggers, RN  July 03, 2010 10:48 AM   Additional Follow-up for Phone Call Additional follow up Details #1::        pt is aware, MUGA ordered Meredith Staggers, RN  July 03, 2010 2:53 PM

## 2010-07-16 NOTE — Letter (Signed)
Summary: Appointment - Reschedule  [facility], Main Office  [address]   [place], [place] [address]   Phone: [phone]  Fax: [phone]     July 01, 2010 MRN: [id number]   [facility] [address] [place], [place]  [address]   Dear Ms. Fennelly,   Due to a change in our office schedule, your appointment on 09/08/10 at 11:15 must be changed.  It is very important that we reach you to reschedule this appointment. We look forward to participating in your health care needs. Please contact us at the number listed above at your earliest convenience to reschedule this appointment.     Sincerely,  Artist

## 2010-07-22 NOTE — Progress Notes (Signed)
Summary: MUGA Information  Phone Note Outgoing Call Call back at Home Phone [phone]   Call placed by: Henrine Screws Call placed to: Patient Reason for Call: Confirm/change Appt Summary of Call: Reviewed information on MUGA Information Sheet (see scanned document for further details).  Spoke with patient.      Muga Study  Indication: Evaluate LVEF. Recent Echo done 06/23/10 showed EF of 35-40%. History af CHF/NICM due to chemotherapy in past.  Muga Information: The patient's red blood cells were labeled using the Ultra Tag method with ____ mci of Technitium-1m Pertechnetate.  The images were reconstructed in the Anterior, Lateral and Left Anterior Oblique Views.

## 2010-07-22 NOTE — Assessment & Plan Note (Addendum)
Summary: Cardiology Rest MugaTesting    Muga Study  Indication:  Evaluate LVEF. Recent Echo done 06/23/10 showed EF of 35-40%. History af CHF/NICM due to chemotherapy in past for (R) breast cancer with lumpectomy 2008. IV 22G angiocath (L) wrist. Patsy Edwards,RN  Muga Information: The patient's red blood cells were labeled using the Ultra Tag method with 33 mci of Technitium-15m Pertechnetate.  The images were reconstructed in the Anterior, Lateral and Left Anterior Oblique Views.  Impression: Resting EF was 58%    Appended Document: Cardiology Rest MugaTesting EF normal. lets repeat MUGA in 6 months to ensure stability.   Appended Document: Cardiology Rest MugaTesting Left message to call back   Appended Document: Cardiology Rest MugaTesting pt aware

## 2010-07-30 ENCOUNTER — Ambulatory Visit
Admission: RE | Admit: 2010-07-30 | Discharge: 2010-07-30 | Disposition: A | Discharge status: Home / Self Care | Payer: Medicare Other | Source: Ambulatory Visit | Attending: Oncology | Admitting: Oncology

## 2010-07-30 DIAGNOSIS — Z9289 Personal history of other medical treatment: Secondary | ICD-10-CM

## 2010-07-31 ENCOUNTER — Encounter (HOSPITAL_BASED_OUTPATIENT_CLINIC_OR_DEPARTMENT_OTHER): Payer: Medicare Other | Admitting: Oncology

## 2010-07-31 ENCOUNTER — Other Ambulatory Visit: Payer: Self-pay | Admitting: Oncology

## 2010-07-31 ENCOUNTER — Encounter: Payer: Self-pay | Admitting: Internal Medicine

## 2010-07-31 DIAGNOSIS — C50919 Malignant neoplasm of unspecified site of unspecified female breast: Secondary | ICD-10-CM

## 2010-07-31 DIAGNOSIS — Z170 Estrogen receptor positive status [ER+]: Secondary | ICD-10-CM

## 2010-07-31 DIAGNOSIS — I429 Cardiomyopathy, unspecified: Secondary | ICD-10-CM

## 2010-07-31 DIAGNOSIS — R635 Abnormal weight gain: Secondary | ICD-10-CM

## 2010-07-31 LAB — CBC WITH DIFFERENTIAL/PLATELET
BASO%: 1.2 % (ref 0.0–2.0)
Basophils Absolute: 0.1 10*3/uL (ref 0.0–0.1)
EOS%: 4.9 % (ref 0.0–7.0)
HCT: 31 % — ABNORMAL LOW (ref 34.8–46.6)
HGB: 10.6 g/dL — ABNORMAL LOW (ref 11.6–15.9)
MCH: 30.9 pg (ref 25.1–34.0)
MONO#: 0.6 10*3/uL (ref 0.1–0.9)
NEUT%: 44.5 % (ref 38.4–76.8)
RDW: 13 % (ref 11.2–14.5)
WBC: 6.9 10*3/uL (ref 3.9–10.3)
lymph#: 2.8 10*3/uL (ref 0.9–3.3)

## 2010-08-01 LAB — COMPREHENSIVE METABOLIC PANEL
ALT: 29 U/L (ref 0–35)
AST: 23 U/L (ref 0–37)
Alkaline Phosphatase: 68 U/L (ref 39–117)
Calcium: 9.3 mg/dL (ref 8.4–10.5)
Chloride: 101 mEq/L (ref 96–112)
Creatinine, Ser: 1.94 mg/dL — ABNORMAL HIGH (ref 0.40–1.20)
Potassium: 5 mEq/L (ref 3.5–5.3)

## 2010-08-01 LAB — IRON AND TIBC
%SAT: 24 % (ref 20–55)
Iron: 88 ug/dL (ref 42–145)
TIBC: 360 ug/dL (ref 250–470)

## 2010-08-01 LAB — RETICULOCYTES (CHCC): Retic Ct Pct: 1.5 % (ref 0.4–3.1)

## 2010-08-17 ENCOUNTER — Encounter: Payer: Self-pay | Admitting: Internal Medicine

## 2010-08-19 ENCOUNTER — Encounter (INDEPENDENT_AMBULATORY_CARE_PROVIDER_SITE_OTHER): Payer: Self-pay | Admitting: *Deleted

## 2010-08-25 NOTE — Letter (Signed)
Summary: Appointment - Reschedule  [facility], Main Office  [address]   [place], [place] [address]   Phone: [phone]  Fax: [phone]     August 19, 2010 MRN: [id number]   [facility] [address] [place], [place]  [address]   Dear Ms. Sawdey,   Due to a change in our office schedule, your appointment on march 20,2012 at 11:45 must be changed.  It is very important that we reach you to reschedule this appointment. We look forward to participating in your health care needs. Please contact us at the number listed above at your earliest convenience to reschedule this appointment.     Sincerely, Judie Grieve [facility] Scheduling Team

## 2010-08-27 LAB — LIPASE, BLOOD: Lipase: 29 U/L (ref 11–59)

## 2010-08-27 LAB — DIFFERENTIAL
Basophils Absolute: 0.1 10*3/uL (ref 0.0–0.1)
Eosinophils Absolute: 0.1 10*3/uL (ref 0.0–0.7)
Eosinophils Absolute: 0.2 10*3/uL (ref 0.0–0.7)
Eosinophils Relative: 2 % (ref 0–5)
Eosinophils Relative: 2 % (ref 0–5)
Lymphs Abs: 2.9 10*3/uL (ref 0.7–4.0)
Monocytes Absolute: 0.7 10*3/uL (ref 0.1–1.0)

## 2010-08-27 LAB — COMPREHENSIVE METABOLIC PANEL
ALT: 55 U/L — ABNORMAL HIGH (ref 0–35)
ALT: 56 U/L — ABNORMAL HIGH (ref 0–35)
AST: 62 U/L — ABNORMAL HIGH (ref 0–37)
AST: 65 U/L — ABNORMAL HIGH (ref 0–37)
Albumin: 4.2 g/dL (ref 3.5–5.2)
CO2: 24 mEq/L (ref 19–32)
CO2: 26 mEq/L (ref 19–32)
Calcium: 9.6 mg/dL (ref 8.4–10.5)
Chloride: 97 mEq/L (ref 96–112)
Chloride: 99 mEq/L (ref 96–112)
Creatinine, Ser: 0.88 mg/dL (ref 0.4–1.2)
GFR calc Af Amer: >60 mL/min (ref >60)
GFR calc Af Amer: >60 mL/min (ref >60)
GFR calc non Af Amer: >60 mL/min (ref >60)
GFR calc non Af Amer: >60 mL/min (ref >60)
Sodium: 135 mEq/L (ref 135–145)
Sodium: 136 mEq/L (ref 135–145)
Total Bilirubin: 0.6 mg/dL (ref 0.3–1.2)
Total Bilirubin: 0.8 mg/dL (ref 0.3–1.2)

## 2010-08-27 LAB — CBC
HCT: 40 % (ref 36.0–46.0)
Hemoglobin: 13.1 g/dL (ref 12.0–15.0)
Hemoglobin: 13.2 g/dL (ref 12.0–15.0)
MCH: 29.5 pg (ref 26.0–34.0)
Platelets: 238 10*3/uL (ref 150–400)
RBC: 4.32 MIL/uL (ref 3.87–5.11)
RBC: 4.48 MIL/uL (ref 3.87–5.11)

## 2010-08-27 LAB — SURGICAL PCR SCREEN: Staphylococcus aureus: NEGATIVE

## 2010-08-27 LAB — GLUCOSE, CAPILLARY
Glucose-Capillary: 267 mg/dL — ABNORMAL HIGH (ref 70–99)
Glucose-Capillary: 290 mg/dL — ABNORMAL HIGH (ref 70–99)

## 2010-09-01 ENCOUNTER — Ambulatory Visit (INDEPENDENT_AMBULATORY_CARE_PROVIDER_SITE_OTHER): Payer: Self-pay | Admitting: Internal Medicine

## 2010-09-01 DIAGNOSIS — R0989 Other specified symptoms and signs involving the circulatory and respiratory systems: Secondary | ICD-10-CM

## 2010-09-01 NOTE — Progress Notes (Signed)
Summary: [facility]: Office Visit  [facility]: Office Visit   Imported By: Earl Many 08/21/2010 13:33:39  _____________________________________________________________________  External Attachment:    Type:   Image     Comment:   External Document

## 2010-09-04 LAB — POCT I-STAT 3, VENOUS BLOOD GAS (G3P V)
Acid-Base Excess: 1 mmol/L (ref 0.0–2.0)
Bicarbonate: 26.6 mEq/L — ABNORMAL HIGH (ref 20.0–24.0)
Bicarbonate: 27.1 mEq/L — ABNORMAL HIGH (ref 20.0–24.0)
TCO2: 28 mmol/L (ref 0–100)
pCO2, Ven: 44.5 mmHg — ABNORMAL LOW (ref 45.0–50.0)
pCO2, Ven: 45.2 mmHg (ref 45.0–50.0)
pH, Ven: 7.378 — ABNORMAL HIGH (ref 7.250–7.300)
pH, Ven: 7.384 — ABNORMAL HIGH (ref 7.250–7.300)
pH, Ven: 7.392 — ABNORMAL HIGH (ref 7.250–7.300)
pO2, Ven: 35 mmHg (ref 30.0–45.0)
pO2, Ven: 37 mmHg (ref 30.0–45.0)

## 2010-09-04 LAB — POCT I-STAT 3, ART BLOOD GAS (G3+)
Bicarbonate: 24.7 mEq/L — ABNORMAL HIGH (ref 20.0–24.0)
O2 Saturation: 98 %
TCO2: 26 mmol/L (ref 0–100)
pO2, Arterial: 96 mmHg (ref 80.0–100.0)

## 2010-09-14 LAB — BASIC METABOLIC PANEL
BUN: 13 mg/dL (ref 6–23)
Chloride: 106 mEq/L (ref 96–112)
GFR calc non Af Amer: >60 mL/min (ref >60)
Glucose, Bld: 118 mg/dL — ABNORMAL HIGH (ref 70–99)
Potassium: 3.9 mEq/L (ref 3.5–5.1)
Sodium: 137 mEq/L (ref 135–145)

## 2010-09-14 LAB — DIFFERENTIAL
Basophils Absolute: 0 10*3/uL (ref 0.0–0.1)
Eosinophils Absolute: 0.3 10*3/uL (ref 0.0–0.7)
Eosinophils Relative: 3 % (ref 0–5)
Lymphocytes Relative: 27 % (ref 12–46)
Lymphs Abs: 2.4 10*3/uL (ref 0.7–4.0)
Monocytes Absolute: 0.8 10*3/uL (ref 0.1–1.0)

## 2010-09-14 LAB — BRAIN NATRIURETIC PEPTIDE: Pro B Natriuretic peptide (BNP): 36 pg/mL (ref 0.0–100.0)

## 2010-09-14 LAB — POCT CARDIAC MARKERS: Troponin i, poc: <0.05 ng/mL (ref 0.00–0.09)

## 2010-09-14 LAB — CBC
HCT: 31.4 % — ABNORMAL LOW (ref 36.0–46.0)
Hemoglobin: 10.5 g/dL — ABNORMAL LOW (ref 12.0–15.0)
MCV: 87.9 fL (ref 78.0–100.0)
Platelets: 214 10*3/uL (ref 150–400)
RDW: 15.4 % (ref 11.5–15.5)

## 2010-09-30 ENCOUNTER — Ambulatory Visit (INDEPENDENT_AMBULATORY_CARE_PROVIDER_SITE_OTHER): Payer: Medicare Other | Admitting: Internal Medicine

## 2010-09-30 ENCOUNTER — Encounter: Payer: Self-pay | Admitting: Internal Medicine

## 2010-09-30 VITALS — BP 80/58 | HR 84 | Ht 67.0 in | Wt 242.0 lb

## 2010-09-30 DIAGNOSIS — I5022 Chronic systolic (congestive) heart failure: Secondary | ICD-10-CM

## 2010-09-30 DIAGNOSIS — I951 Orthostatic hypotension: Secondary | ICD-10-CM

## 2010-09-30 MED ORDER — TORSEMIDE 20 MG PO TABS: ORAL_TABLET | ORAL | Status: DC

## 2010-09-30 NOTE — Assessment & Plan Note (Signed)
Not overtly orthostatic. See HF discussion.

## 2010-09-30 NOTE — Progress Notes (Signed)
HPI:  [name] is a 40 year old woman with a history of morbid obesity, depression, fibromyalgia, congestive heart failure secondary to nonischemic cardiomyopathy related to her chemotherapy for breast cancer. Last chem 2008. Still in remission.    CPX test  Feb 2010. Peak VO2 was 14.5 which was 71% of predicted. When corrected for body weight the VO2 was 20.7. The slope was 27. RER 1.20. O2 pulse 73%. Overall this was felt to be only a very mild functional limitation due to her obesity and mild circulatory   Had echo 11/10 which showed EF back down to 25%. So patient underwent repeat echo, CPX and RHC for profound fatigue.  Echo 2/11 showed EF 50% RHC: RA 4 PA 26/9 (16) PCWP 9 Fick CO 5.7/ 2.5    CPX 2/11: pVO2 13.0 (72%) correct for ideal wt 75ml/kg/min RER 1.06 (submax) slope 28.2 O2 pulse 91% - felt no signifcant cardiac limitation. + deconditioning.  Echo 1/12 EF 40%. So we did MUGA EF 58%.   Here for routine f/u. Feels good in general. Getting pain from fibromyalgia. Says energy level remains low but denies significant dyspnea. Edema well-controlled with Demadex. Takes extra demadex as needed. No orthopnea or PND.   Doing well from a cardiac standpoint. Walking on track 1 mile every day. No CP or SOB. Has lost 12 pounds. Edema well controlled with lasix. Takes extra lasix as needed. No orthopnea or PND. No problems with meds. Weight down 20 pounds. Occasionally dizzy - worse when standing. No syncope.    ROS: All systems negative except as listed in HPI, PMH and Problem List.  Past Medical History  Diagnosis Date  . CHF (congestive heart failure)     due to non-ischemic cardiomyopathy, thought to be chemotherapy induced  . Breast cancer   . Peripheral neuropathy     chemo- induced  . Hypertension     c/b orthostatic hypotetion  . Depression   . Palpitation     normal sinus rythm only on 21 day heart monitor  . Cough   . Obesity   . Dizziness   . Fibromyalgia     Current  Outpatient Prescriptions  Medication Sig Dispense Refill  . b complex vitamins tablet Take 1 tablet by mouth daily.        . carvedilol (COREG) 12.5 MG tablet 12.5 mg. 1 1/2 tablets by mouth two times daily       . Cholecalciferol (VITAMIN D3) 5000 UNITS CAPS Take 1 capsule by mouth daily.        . Coenzyme Q10 (CO Q-10 PO) 1 tab po qd       . Flaxseed, Linseed, (FLAXSEED OIL) OIL 2 teaspoons daily       . letrozole (FEMARA) 2.5 MG tablet Take 2.5 mg by mouth daily.        Marland Kitchen lisinopril (PRINIVIL,ZESTRIL) 10 MG tablet Take 10 mg by mouth 2 (two) times daily.        Marland Kitchen LORazepam (ATIVAN) 1 MG tablet Take 1 mg by mouth 2 (two) times daily.        . methocarbamol (ROBAXIN) 750 MG tablet Take 750 mg by mouth daily.        Marland Kitchen oxyCODONE-acetaminophen (PERCOCET) 5-325 MG per tablet 4 times daily      . potassium chloride SA (K-DUR,KLOR-CON) 20 MEQ tablet Take 20 mEq by mouth 2 (two) times daily.        . pregabalin (LYRICA) 200 MG capsule Take 200 mg by mouth 2 (two)  times daily.        . Prenatal Vit-Fe Fumarate-FA (PRENATAL S) 27-0.8 MG tablet Take 1 tablet by mouth daily.        Marland Kitchen spironolactone (ALDACTONE) 25 MG tablet 25 mg. Take 1/2 tablet daily       . torsemide (DEMADEX) 20 MG tablet Take 2 tablets by mouth daily       . traZODone (DESYREL) 100 MG tablet Take 100 mg by mouth as needed.        . zolpidem (AMBIEN) 10 MG tablet Take 10 mg by mouth at bedtime as needed.        Marland Kitchen DISCONTD: Cholecalciferol (VITAMIN D3) 5000 UNIT/ML LIQD Take by mouth daily.       Marland Kitchen DISCONTD: diclofenac (VOLTAREN) 75 MG EC tablet Take 75 mg by mouth daily.           PHYSICAL EXAM: Filed Vitals:   09/30/10 1159  BP: 90/64  Pulse: 84  BP checked with Doppler On standing SBP 84 by Doppler (not orthostatic) General:  Well appearing. No resp difficulty HEENT: normal Neck: supple. JVP flat. Carotids 2+ bilaterally; no bruits. No lymphadenopathy or thryomegaly appreciated. Cor: PMI normal. Regular rate & rhythm. No  rubs, gallops or murmurs. Lungs: clear Abdomen: soft, nontender, nondistended. No hepatosplenomegaly. No bruits or masses. Good bowel sounds. Extremities: no cyanosis, clubbing, rash, edema Neuro: alert & orientedx3, cranial nerves grossly intact. Moves all 4 extremities w/o difficulty. Affect pleasant.    ECG: NSR 84 diffuse TWI (chronic0   ASSESSMENT & PLAN:

## 2010-09-30 NOTE — Patient Instructions (Signed)
Decrease Torsemide to 20mg  in AM and 10mg  (1/2 tab) in PM If continue to have dizziness can decrease Carvedilol to 1 tab Twice daily  Your physician recommends that you schedule a follow-up appointment in: 2-3 weeks

## 2010-09-30 NOTE — Assessment & Plan Note (Signed)
Doing fairly well. EF recovered. NYHA II. Now with symptomatic hypotension. Cut demadex back to 20/10. If still symptomatic can cut carvedilol back to 12.5 bid. F/u in 2 months or sooner if needed.

## 2010-10-14 ENCOUNTER — Encounter: Payer: Self-pay | Admitting: Internal Medicine

## 2010-10-19 ENCOUNTER — Ambulatory Visit (INDEPENDENT_AMBULATORY_CARE_PROVIDER_SITE_OTHER): Payer: Medicare Other | Admitting: Internal Medicine

## 2010-10-19 ENCOUNTER — Encounter: Payer: Self-pay | Admitting: Internal Medicine

## 2010-10-19 DIAGNOSIS — I5022 Chronic systolic (congestive) heart failure: Secondary | ICD-10-CM

## 2010-10-19 DIAGNOSIS — I959 Hypotension, unspecified: Secondary | ICD-10-CM

## 2010-10-19 DIAGNOSIS — R5383 Other fatigue: Secondary | ICD-10-CM

## 2010-10-19 DIAGNOSIS — R5381 Other malaise: Secondary | ICD-10-CM

## 2010-10-19 DIAGNOSIS — R42 Dizziness and giddiness: Secondary | ICD-10-CM

## 2010-10-19 LAB — HEPATIC FUNCTION PANEL
ALT: 28 U/L (ref 0–35)
AST: 29 U/L (ref 0–37)
Bilirubin, Direct: 0.2 mg/dL (ref 0.0–0.3)
Total Bilirubin: 0.8 mg/dL (ref 0.3–1.2)
Total Protein: 8 g/dL (ref 6.0–8.3)

## 2010-10-19 LAB — CORTISOL: Cortisol, Plasma: 8.1 ug/dL

## 2010-10-19 LAB — CBC WITH DIFFERENTIAL/PLATELET
Basophils Absolute: 0 10*3/uL (ref 0.0–0.1)
Basophils Relative: 0.5 % (ref 0.0–3.0)
Eosinophils Absolute: 0.3 10*3/uL (ref 0.0–0.7)
MCHC: 34.7 g/dL (ref 30.0–36.0)
MCV: 91.4 fl (ref 78.0–100.0)
Monocytes Absolute: 0.9 10*3/uL (ref 0.1–1.0)
Neutrophils Relative %: 58.5 % (ref 43.0–77.0)
Platelets: 243 10*3/uL (ref 150.0–400.0)
RDW: 13.1 % (ref 11.5–14.6)

## 2010-10-19 LAB — BASIC METABOLIC PANEL
CO2: 24 mEq/L (ref 19–32)
Chloride: 102 mEq/L (ref 96–112)
Potassium: 4.7 mEq/L (ref 3.5–5.1)

## 2010-10-19 LAB — TSH: TSH: 2.52 u[IU]/mL (ref 0.35–5.50)

## 2010-10-19 NOTE — Progress Notes (Signed)
HPI:  [name] is a 40 year old woman with a history of morbid obesity, depression, fibromyalgia, congestive heart failure secondary to nonischemic cardiomyopathy related to her chemotherapy for breast cancer. Last chem 2008. Still in remission.    CPX test  Feb 2010. Peak VO2 was 14.5 which was 71% of predicted. When corrected for body weight the VO2 was 20.7. The slope was 27. RER 1.20. O2 pulse 73%. Overall this was felt to be only a very mild functional limitation due to her obesity and mild circulatory   Had echo 11/10 which showed EF back down to 25%. So patient underwent repeat echo, CPX and RHC for profound fatigue.  Echo 2/11 showed EF 50% RHC: RA 4 PA 26/9 (16) PCWP 9 Fick CO 5.7/ 2.5    CPX 2/11: pVO2 13.0 (72%) correct for ideal wt 78ml/kg/min RER 1.06 (submax) slope 28.2 O2 pulse 91% - felt no signifcant cardiac limitation. + deconditioning.  Echo 1/12 EF 40%. So we did MUGA EF 58%.   We saw her a few weeks ago and was doing well except BP was low (84 by Doppler). Demadex cut back to 20/10.  She also cut her carvedilol from 18.75 to 12.5 bid. Here for f/u  Still feels dizzy. Presyncopal. No edema, orthopnea or PND. No fevers or chills. No bleeding. Occasional diarrhea. Weight down over 25 pounds in past year.   ROS: All systems negative except as listed in HPI, PMH and Problem List.  Past Medical History  Diagnosis Date  . CHF (congestive heart failure)     due to non-ischemic cardiomyopathy, thought to be chemotherapy induced  . Breast cancer   . Peripheral neuropathy     chemo- induced  . Hypertension     c/b orthostatic hypotetion  . Depression   . Palpitation     normal sinus rythm only on 21 day heart monitor  . Cough   . Obesity   . Dizziness   . Fibromyalgia     Current Outpatient Prescriptions  Medication Sig Dispense Refill  . b complex vitamins tablet Take 1 tablet by mouth daily.        . carvedilol (COREG) 12.5 MG tablet 12.5 mg. 1 1/2 tablets by mouth  two times daily       . Cholecalciferol (VITAMIN D3) 5000 UNITS CAPS Take 1 capsule by mouth daily.        . Coenzyme Q10 (CO Q-10 PO) 1 tab po qd       . Flaxseed, Linseed, (FLAXSEED OIL) OIL 2 teaspoons daily       . letrozole (FEMARA) 2.5 MG tablet Take 2.5 mg by mouth daily.        Marland Kitchen lisinopril (PRINIVIL,ZESTRIL) 10 MG tablet Take 10 mg by mouth 2 (two) times daily.        Marland Kitchen LORazepam (ATIVAN) 1 MG tablet Take 1 mg by mouth 2 (two) times daily.        . methocarbamol (ROBAXIN) 750 MG tablet Take 750 mg by mouth daily.        Marland Kitchen oxyCODONE-acetaminophen (PERCOCET) 5-325 MG per tablet 4 times daily      . potassium chloride SA (K-DUR,KLOR-CON) 20 MEQ tablet Take 20 mEq by mouth 2 (two) times daily.        . pregabalin (LYRICA) 200 MG capsule Take 200 mg by mouth 2 (two) times daily.        . Prenatal Vit-Fe Fumarate-FA (PRENATAL S) 27-0.8 MG tablet Take 1 tablet by mouth daily.        Marland Kitchen  spironolactone (ALDACTONE) 25 MG tablet 25 mg. Take 1/2 tablet daily       . torsemide (DEMADEX) 20 MG tablet Take 1 tab in AM and 1/2 tab in PM      . traZODone (DESYREL) 100 MG tablet Take 100 mg by mouth as needed.        . zolpidem (AMBIEN) 10 MG tablet Take 10 mg by mouth at bedtime as needed.           PHYSICAL EXAM: Filed Vitals:   10/19/10 1443  BP: 78/0  Pulse: 84  Resp: 18   General:  Well appearing. No resp difficulty HEENT: normal Neck: supple. JVP flat. Carotids 2+ bilaterally; no bruits. No lymphadenopathy or thryomegaly appreciated. Cor: PMI normal. Regular rate & rhythm. No rubs, gallops or murmurs. Lungs: clear Abdomen: soft, nontender, nondistended. No hepatosplenomegaly. No bruits or masses. Good bowel sounds. Extremities: no cyanosis, clubbing, rash, edema Neuro: alert & orientedx3, cranial nerves grossly intact. Moves all 4 extremities w/o difficulty. Affect pleasant.    ECG: NSR 84 diffuse TWI (chronic0   ASSESSMENT & PLAN:

## 2010-10-19 NOTE — Assessment & Plan Note (Signed)
LV function now normalized. Will attempt to continue b-block and ACE-I for now and just wean diuretic. May have to cut back on other meds as well.

## 2010-10-19 NOTE — Patient Instructions (Signed)
Your physician recommends that you schedule a follow-up appointment in: 1 month  

## 2010-10-19 NOTE — Assessment & Plan Note (Addendum)
Will hold demadex and only use PRN. Check labs including CMET, CBC, TSH and cortisol. Liberalize salt and fluid intake gradually. Return in 4 weeks. Call sooner if not getting better.

## 2010-10-26 ENCOUNTER — Telehealth: Payer: Self-pay | Admitting: Internal Medicine

## 2010-10-26 DIAGNOSIS — I5022 Chronic systolic (congestive) heart failure: Secondary | ICD-10-CM

## 2010-10-26 DIAGNOSIS — D649 Anemia, unspecified: Secondary | ICD-10-CM

## 2010-10-26 NOTE — Telephone Encounter (Signed)
Pt returned your call from Friday.  Please call her back

## 2010-10-26 NOTE — Telephone Encounter (Signed)
Pt. Given lab results from 10/20/10 and information from Dr. Gala Romney regarding results.  She is not taking any diuretics and will come to office for BMET on 10/27/10. She is agreeable to heme consult and I told her we would place this order for her.

## 2010-10-27 ENCOUNTER — Other Ambulatory Visit (INDEPENDENT_AMBULATORY_CARE_PROVIDER_SITE_OTHER): Payer: Medicare Other | Admitting: *Deleted

## 2010-10-27 DIAGNOSIS — I5022 Chronic systolic (congestive) heart failure: Secondary | ICD-10-CM

## 2010-10-27 LAB — BASIC METABOLIC PANEL
CO2: 25 mEq/L (ref 19–32)
Chloride: 107 mEq/L (ref 96–112)
Potassium: 5.2 mEq/L — ABNORMAL HIGH (ref 3.5–5.1)
Sodium: 140 mEq/L (ref 135–145)

## 2010-10-27 NOTE — Assessment & Plan Note (Signed)
[facility]                          CHRONIC HEART FAILURE NOTE   Krystal, Roy                      MRN:          [id number]  DATE:08/17/2007                            DOB:          04/26/1971    PRIMARY CARDIOLOGIST:  Dr. Nicholes Mango.   ONCOLOGIST:  Dr. Pierce Crane.   HISTORY OF PRESENT ILLNESS:  [name] returns today for follow-up of her  congestive heart failure which is secondary to nonischemic  cardiomyopathy most likely related to her chemotherapy.  Ejection  fraction has been 20-25% range.  Status post 2-D echocardiogram this  morning showed EF now 30% with diffuse left ventricular hypokinesis.  Status post recent CPX test also showing mild to moderate reduced peak  pO2 of 13.7 mL/kg per minute which is 63% of the age/gender/weight match  sedentary norm.  RER of 1.17 indicating a maximal effort.  Exercise  testing with gas exchange demonstrate a moderate functional limitation  due primarily heart failure.  [name] actually looks good today compared  to the last time I saw her around Christmas time.  She unfortunately  continues to have episodes of falling where her legs just get weak and  go out from under her.  Her mother accompanies her again today, states  she heard her fall down the steps the other night.  Apparently, she got  up to check on her mother who was down stairs, and [name] says when she  started going down the steps her feet just went out from under her and  she fell down the whole flight of steps, had some bruising on her  buttocks.  States she did not get checked out, but she feels better now.  She just had been real sore for a week.  She also has been trying to eat  more healthy.  She is taking some supplements.  She said she is taking  organic flaxseed and Go Green vitamin supplement and a Pure Cleanse  detox twice a day.  She still complains of dyspnea on exertion.  Denies  any orthopnea or PND.  She states she  has more good days than bad days  now.  Still has episodes of dizziness with abrupt changes in  positioning.  Still gets extremely dyspneic with exertion.   PAST MEDICAL HISTORY:  1. Congestive heart failure secondary to nonischemic cardiomyopathy      with class III New [place] Heart Association symptoms previously      treated with Adriamycin therapy.  2. Breast cancer status post mastectomy multiple rounds of      chemotherapy including Adriamycin and radiation therapy.  3. Hypertension.  4. Situational depression.  5. Status post CPX test results as stated above.  6. Sinus tachycardia.  7. 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  8. New onset of falls with unclear etiology.  The patient has been      evaluated by Dr. Vickey Huger.   REVIEW OF SYSTEMS:  As stated above, otherwise negative.   CURRENT MEDICATIONS:  1. Tamoxifen 20 mg daily.  2. Lexapro 20 mg daily.  3. Multivitamin daily.  4. Stool softener p.r.n.  5. Ambien p.r.n.  6. Lasix 20 mg daily.  7. Prilosec 20 mg b.i.d.  8. B complex vitamin daily.  9. Digoxin 0.125 mg daily.  10.Lisinopril 10 mg in the a.m. and 5 mg in the evening.  11.KCl 20 mEq daily.  12.Body Rejuvenator supplement daily.  13.Coreg 12.5 mg b.i.d.  14.Neurontin 300 mg b.i.d.  15.Wellbutrin 150 mg daily:  16.Colon Cleanse b.i.d.  17.Acai capsule b.i.d.  18.Flaxseed oil 2 teaspoons daily.  19.Ground flax seeds 2 teaspoons daily p.r.n.  20.Delsym.  21.Tussionex.  22.Hydrocodone.  23.Lasix 20 mg.  24.TobraDex eye drops.  25.Ativan.   PHYSICAL EXAMINATION:  VITAL SIGNS:  Weight 203, blood pressure 117/59  with a heart rate of 78.  Resting pulse ox 100% on room air.  GENERAL:  In no acute distress.  No signs of jugular vein distention at  45 degrees angle.  LUNGS:  Clear to auscultation.  CARDIOVASCULAR:  Reveals S1-S2, regular rate and rhythm.  Do not hear  any murmurs, rubs or gallops.  ABDOMEN:  Soft, nontender, positive bowel  sounds, obese.  LOWER EXTREMITIES:  Without clubbing, cyanosis or edema.  NEUROLOGICAL:  Alert and oriented x3, positive affect today.   CLINICAL DATA:  Ambulated in clinic with me.  Ambulated around 300 feet.  She did become dyspneic with walk.  Stopped twice, also complained of  mild dizziness, but much improved from when we took a walk back in the  fall at which time she was unable to complete 300 feet.   IMPRESSION:  Congestive heart failure still with class III symptoms.  Continue current medications, increase lisinopril to 10 mg b.i.d.  Will  slowly titrate medications as [name]  continues to complained of dizziness with change in position and has had  several falls.  Will review CPX and echo results.  Also, check blood  work.      Dorian Pod, ACNP  Electronically Signed      Krystal [name], MD, [facility]  Electronically Signed   MB/MedQ  DD: 08/17/2007  DT: 08/17/2007  Job #: [id number]   cc:   Pierce Crane, MD

## 2010-10-27 NOTE — Assessment & Plan Note (Signed)
[facility]                            CARDIOLOGY OFFICE NOTE   [name], [name]                      MRN:          [id number]  DATE:07/21/2007                            DOB:          [birth year]    ONCOLOGIST:  Dr. Pierce Roy.   INTERVAL HISTORY:  [name] is a delightful 40 year old woman with a  history of breast cancer, hypertension and nonischemic cardiomyopathy  related to her chemotherapy, ejection fraction most recently has been in  20-25% range.  She returns today for routine follow up. At her last  visit referred for cardiopulmonary exercise testing. I do not have those  results in hand as yet.   She tells me she is doing much better.  She feels like she has a  significant amount of more energy.  However, she does continue to have  shortness of breath.  She gets short of breath with mild to moderate  activity.  She is unable really to go up steps without breathing  heavily.  She has been watching her weight closely and any time she  develops extra fluid she is quick to take an extra dose of Lasix.  She  currently denies any orthopnea or PND.   CURRENT MEDICATIONS:  Potassium 20 a day, Coumadin, Coreg 9.375 b.i.d.,  Ativan  p.r.n., tamoxifen, Lexapro 20 a day, Neurontin 300 a day, Ambien  p.r.n., Lasix 20 a day, lisinopril 10 in the morning 5 at night,  Prilosec 20 b.i.d., digoxin 0.125 mg a day.   PHYSICAL EXAM:  She is well-appearing, in no acute distress, ambulates  around the clinic without respiratory difficulty.  Blood pressure is  108/78, heart rate 78 weight is 205 which is down 2 pounds.  HEENT is  normal.  NECK:  Supple.  There is no JVD.  Carotids are 2+ bilateral without  bruits.  There is no lymphadenopathy or thyromegaly.  CARDIAC:  PMI is laterally displaced.  She is regular.  No murmurs,  rubs.  I do not hear a gallop.  LUNGS:  Clear.  ABDOMEN is obese, nontender, nondistended, no hepatosplenomegaly, no  bruits,  no mass.  '  EXTREMITIES:  Warm.  No cyanosis, clubbing or edema.  No rash.  NEURO:  Alert and x3.  Cranial nerves II-XII intact.  Moves all four  extremities without difficulty.  Affect is much improved.   ASSESSMENT/PLAN:  1. Congestive heart failure, secondary nonischemic cardiomyopathy due      to chemotherapy related  toxicity.  She is a improving slowly.  She      is still NYHA class III.  She is euvolemic. We will titrate her      Coreg to 12.5 b.i.d.Marland Kitchen  I will follow up on the results of her CPX      test and      get back to her.  She will see [name] in the heart failure      clinic in 3 weeks for further titration of her ACE inhibitor.  She      will get a repeat ultrasound next month.  [name]. Bensimhon, MD  Electronically Signed    DRB/MedQ  DD: 07/21/2007  DT: 07/23/2007  Job #: [id number]

## 2010-10-27 NOTE — Assessment & Plan Note (Signed)
[facility]                          CHRONIC HEART FAILURE NOTE   [name], [name]                      MRN:          [id number]  DATE:01/16/2008                            DOB:          1971-06-14    PRIMARY CARDIOLOGIST:  Bevelyn Buckles. Bensimhon, MD.   ONCOLOGIST:  Pierce Crane, MD.   [name] returns today for followup of her congestive heart failure,  which is secondary to nonischemic cardiomyopathy, most likely  chemotherapy-induced.  Most recent echocardiogram shows an EF now of 45-  50%, previously 30%.  When I last saw [name] in June, she was preparing  for a trip to the [place] with her family.  She has since taken that  trip.  She states she did quite well till the day they were preparing to  return home, she had an episode where she got real weak and dizzy, felt  very fatigued, she laid down for a while, symptoms finally went away.  She did not pass out with that episode.  Another episode since being  home, she was in the [place] one day, she said she was very warm, she  felt herself getting lightheaded and dizzy, got really warm, her mother  states she had been diaphoretic, was participating in a [employer],  she got presyncopal, sat back down on the pew.  Mother states that it  took about 50 minutes for her to get back to baseline.  [name] has also  had followup appointment with Oncology and had her Port-A-Cath removed.  Blood work done at that time showed a H&H of 12.6 and 36.6, potassium  4.1, BUN and creatinine 10 and 0.74.  This was blood work done on December 21, 2007; otherwise, [name] states she has been doing well.  She tries  to walk with her family.  She is concerned because she keeps gaining  weight, but states her appetite has been really good.  She is frustrated  by this, just has started an exercise program in the last few weeks  since returning home.   PAST MEDICAL HISTORY:  1. Congestive heart failure secondary to  nonischemic cardiomyopathy      with a EF currently of 45-50%.  2. Breast cancer, status post mastectomy, multiple rounds of      chemotherapy and radiation therapy, currently on tamoxifen.  3. History of hypertension.  4. Situational depression.  5. History of palpitations.  I had [name] wear a heart monitor for 21      days earlier this year that showed sinus rhythm.  No arrhythmias      noted.  Although, [name] complained of episodes of lightheadedness      while wearing the monitor, nothing was recorded abnormal.  6. Status post 24-hour blood pressure monitoring done on December 2008      secondary to lightheadedness and dizziness.  The patient's overall      blood pressure averaged 104/66.   REVIEW OF SYSTEMS:  As stated above, otherwise negative.   CURRENT MEDICATIONS:  1. Omeprazole 20 mg b.i.d.  2. Furosemide 20 mg daily.  3. Klor-Con  20 mEq daily.  4. Carvedilol 25 mg b.i.d.  5. Gabapentin 300 mg b.i.d.  6. Lisinopril 10 mg b.i.d.  7. Tamoxifen 20 mg daily.  8. Digoxin 125 mcg daily.  9. Buproprion 150 mg daily.  10.Flaxseed oil daily.  11.Multivitamin daily.  12.Vitamin D daily.  13.Calcium daily.  14.Vitamin B daily.  15.Zoloft 50 mg daily.   P.R.N. MEDICATIONS:  1. Ambien.  2. Lorazepam.  3. Hydrocodone.   PHYSICAL EXAMINATION:  VITAL SIGNS:  Weight 217 pounds, weight is up at  8 pounds since June; blood pressure 116/61, and heart rate 76.  Blood  pressure repeated manually 99/64 after standing with a heart rate of 67.  GENERAL:  [name] is in no acute distress.  NECK:  No signs of jugular vein distention at 45-degree angle.  LUNGS:  Clear to auscultation bilaterally.  CARDIOVASCULAR:  S1 and S2, regular rate and rhythm.  ABDOMEN:  Soft and nontender.  Positive bowel sounds.  EXTREMITIES:  Lower extremities without clubbing, cyanosis, or edema.  NEUROLOGICAL:  Alert and oriented x3.   IMPRESSION:  Congestive heart failure secondary to nonischemic   cardiomyopathy with ejection fraction currently 45%, still with class II  symptoms.  We will continue medications at current dose.  If [name]  continues to have episodes of presyncope, we will have her worked up by  Dr. Gala Roy.     [name], ACNP  Electronically Signed      Krystal [name], MD, [facility]  Electronically Signed   MB/MedQ  DD: 01/16/2008  DT: 01/17/2008  Job #: [id number]   cc:   Pierce Crane, MD

## 2010-10-27 NOTE — Assessment & Plan Note (Signed)
[facility]                          CHRONIC HEART FAILURE NOTE   Roy, Krystal                      MRN:          [id number]  DATE:05/04/2007                            DOB:          22-Jul-1970    [name] presents today for followup of her congestive heart failure  status post recent hospitalization for acute on chronic exacerbation.  [name] was discharged home on November 8.  I spoke with her by phone on  November 9 or 10.  She was complaining of a cough she had developed.  She denied any other symptoms with the cough.  It is nonproductive.  No  fevers or chills.  No orthopnea, PND, or shortness of breath.  At that  time, I felt [name] probably had picked up a cold or virus during  hospitalization.  Her weight has been stable.  I recommended that she  try some over-the-counter cough syrup, if no improvement to let me know,  if not to follow up with me today.  She returns today for followup and  states, Monday night, she had several episodes of vomiting, continued to  have a very intense cough and developed some soreness in her neck and  chest from coughing so much.  Cough has now become a productive thick  tan sputum.  She still denies any fevers or chills, although she  complains of hot flashes, which is not new for her.  She currently is  undergoing radiation therapy and her chemotherapy is on hold until  January.  [name] is very short of breath with minimal exertion.  She is  orthopneic at this time and very weak.   PAST MEDICAL HISTORY:  1. 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  2. Breast cancer status post mastectomy.  Multiple rounds of      chemotherapy including Adriamycin, now undergoing radiation      therapy.  3. Hypertension.  4. GERD.  5. Situational depression.  6. Status post recent hospitalization for acute on chronic  exacerbation of her heart failure, at which time she also had a non-      functioning Port-a-Cath that was treated with TPA.   REVIEW OF SYSTEMS:  As stated above.   ALLERGIES:  REGLAN.   CURRENT MEDICATIONS:  1. Potassium 20 mEq daily.  2. Lisinopril 5 b.i.d.  3. Lasix 20 every other day.  4. Coumadin as directed.  5. Coreg 9.375 b.i.d.  6. Ativan 1 mg daily.  7. Nexium 40 mg daily.  8. Tamoxifen daily.  9. Lexapro 20 mg daily.  10.Neurontin 300 mg daily.  11.Multivitamin daily.  12.Stool softener daily.  13.Ambien daily.  14.Delsym cough syrup p.r.n.   PHYSICAL EXAM:  Weight 204 pounds, blood pressure 140/83, heart rate  127, pulse ox 98% on room air.  CLINICAL DATA:  A 12-lead EKG showed sinus tachycardia at a rate of 114.  [name] is extremely dyspneic with minimal exertion here in the clinic.  She is having a nonproductive cough at this time.  Skin is warm and dry.  No lymphadenopathy.  THROAT:  Mildly red without exudate.  No signs of jugular venous  distension at 45 degree angle.  LUNGS:  Clear.  Slightly distant in the left base.  I do not hear any  crackles.  CARDIOVASCULAR:  S1, S2.  Tachycardic.  I do not hear an S3.  ABDOMEN:  Soft and nontender.  Positive bowel sounds.  LOWER EXTREMITIES:  Without cyanosis, clubbing, or edema.  NEUROLOGIC:  Alert and oriented x3.   IMPRESSION:  Nonischemic cardiomyopathy without signs of volume overload  at this time.  However, the patient is extremely dyspneic and tachypneic  at this time.  Tachycardic EKG, questionable whether or not she may be  developing pneumonia.  Chest x-ray has been obtained here in the office  and does not show any active disease, although there is some blocking of  the diaphragm on the left chest.  This is preliminary reading.  It will  be reviewed by radiology.  I have asked Dr. Antoine Poche to come in and  evaluate [name].  I am concerned and I feel that she needs to be  admitted for further  evaluation in the setting of multiple diagnoses.  Will plan on admitting her to [facility] as she is currently  undergoing her radiation therapy on a daily basis.  Will ask Dr. Donnie Coffin  to evaluate the patient during the hospitalization.  Will plan on  obtaining sputum, urine, and blood cultures, and strep throat culture,  CBC with diff, BNP.  I will continue the patient's home meds for now,  although I suspect [name] may actually be on the dry side.  May need to  discontinue her Lasix temporarily.  Will hold on antibiotics until  cultures are obtained.      Dorian Pod, ACNP  Electronically Signed      Rollene [name], MD, [facility]  Electronically Signed   MB/MedQ  DD: 05/04/2007  DT: 05/04/2007  Job #: [id number]

## 2010-10-27 NOTE — Assessment & Plan Note (Signed)
[facility]                          CHRONIC HEART FAILURE NOTE   LESLY, PONTARELLI                      MRN:          [id number]  DATE:11/29/2007                            DOB:          18-Jan-1971    ONCOLOGIST:  Pierce Crane, MD   PRIMARY CARDIOLOGIST:  Bevelyn Buckles. Bensimhon, MD   Jakia returns today for followup of her congestive heart failure,  which is secondary to nonischemic cardiomyopathy most likely  chemotherapy induced.  Most recent echocardiogram showing an EF now back  to 45%-50% up from previous EF of 30% in March 2009.  Georgean has been  doing quite well in regards to her heart failure and volume overload.  She has had complaints of increased pain in her feet and has been  evaluated by a podiatrist and diagnosed with plantar fasciitis and is  wearing appropriate footwear with orthotics as she states this has  helped.  She had occasional dizziness for the most part resolved.  Also,  still occasionally stumbles, especially if she is very fatigued or has  been up for a while.  She also has ongoing hot flashes.  Other than  that, she has maintained a stable diagnosis of heart failure.   PAST MEDICAL HISTORY:  1. Congestive heart failure secondary to nonischemic cardiomyopathy      with an EF of 40%-45%.  2. Breast cancer, status post mastectomy and multiple rounds of      chemotherapy and radiation therapy.  3. Hypertension.  4. Situational depression.   REVIEW OF SYSTEMS:  As stated above, otherwise negative.   CURRENT MEDICATIONS:  1. Prilosec 20 b.i.d.  2. Furosemide 20 daily.  3. Klor-Con 20 mEq once a day.  4. Digoxin 0.125 mg daily.  5. Neurontin 300 mg b.i.d.  6. Wellbutrin 150 mg daily.  7. Flaxseed oil and capsules daily.  8. Coumadin as prescribed.  9. Tamoxifen 20 mg daily.  10.Multivitamin daily.  11.Lisinopril 10 mg b.i.d.  12.Zoloft 50 mg daily.  13.Coreg 18.75 mg b.i.d.   P.r.n. medications include Lasix,  Ambien, potassium chloride, TobraDex  eye drops, Ativan, stool softener, and hydrocodone.   REVIEW OF SYSTEMS:  As stated above, otherwise are negative.   PHYSICAL EXAMINATION:  Weight 209 pounds and blood pressure 127/72 with  a heart rate of 78.  Murry is in no acute distress today.  NECK:  Supple without signs of JVD.  CARDIOVASCULAR:  S1 and S2.  Positive S4.  LUNGS:  Clear to auscultation bilaterally.  ABDOMEN:  Obese, soft, and nontender.  Positive bowel sounds.  LOWER EXTREMITIES:  Without clubbing, cyanosis, or edema.  NEUROLOGICAL:  Alert and oriented x3.   IMPRESSION:  Congestive heart failure secondary to nonischemic  cardiomyopathy with ejection fraction of 40%-45%, still with class II  symptoms.  Today, we will increase her carvedilol to 25 mg at bedtime,  continue the 18.75 in the a.m.  We will do this for a week and see how  she does and then increase the morning dose also.  Kajol is getting  ready to go out of town on vacation  with her family.  I am going to have  her wait until she comes home and gets settled before she makes a change  in this dose as she is usually very fatigued with the initial titration.      Dorian Pod, ACNP  Electronically Signed      Bevelyn Buckles. Bensimhon, MD  Electronically Signed   MB/MedQ  DD: 11/29/2007  DT: 11/30/2007  Job #: [id number]   cc:   Pierce Crane, MD

## 2010-10-27 NOTE — Discharge Summary (Signed)
[name], [name] NO.:  [id number]   MEDICAL RECORD NO.:  [id number]          PATIENT TYPE:  INP   LOCATION:  1429                         FACILITY:  [facility]   PHYSICIAN:  Rollene [name], MD, FACCDATE OF BIRTH:  1971-03-14   DATE OF ADMISSION:  04/21/2007  DATE OF DISCHARGE:  04/22/2007                               DISCHARGE SUMMARY   PRIMARY DISCHARGE DIAGNOSIS:  Congestive heart failure.   SECONDARY DIAGNOSES:  1. Nonischemic cardiomyopathy, presumed secondary to Adriamycin.  2. Nonfunctioning Port-A-Cath, treated with t-PA this admission.  3. Breast cancer, status post mastectomy, chemotherapy, and radiation      therapy.  4. Hypertension.  5. Gastroesophageal reflux disease.   ALLERGIES/INTOLERANCE:  REGLAN.   DISCHARGE MEDICATIONS:  1. Potassium 20 mEq daily.  2. Lisinopril 5 mg b.i.d.  3. Lasix 20 mg every other day and as needed.  4. Coumadin 1 mg daily.  5. Coreg 6.25 mg b.i.d.  6. Ativan 1 mg daily.  7. Nexium 40 mg daily.  8. Tamoxifen.  9. Lexapro 20 mg daily.  10.Neurontin 300 mg daily.  11.Multivitamin.  12.Stool softener.  13.Ambien.   FOLLOWUP:  Per previous plans.  The patient does have followup in the  heart failure clinic scheduled.  She also follows with a radiation  oncologist and Dr. Donnie Coffin routinely.   HOSPITAL COURSE:  The patient was admitted to the hospital with  intermittent mild orthopnea.  She did have a slightly elevated BNP on  admission of 136.  She had also some chest discomfort that Dr. Daleen Squibb felt  was very atypical.  It was a fleeting right upper chest discomfort.  Overnight on telemetry she had no arrhythmias.  She was oxygenating  well.  She was given 20 mg of IV furosemide.  She did feel better with  this.  The fluid intake/output measurement was incomplete.  However,  again the patient was feeling at baseline at the time of discharge.  Of note, the patient did have a non-functioning Port-A-Cath.  She had a  t-PA Dwell in the Port-A-Cath and this resolved this problem.  At the time of discharge, the patient was ambulating and feeling back to  baseline.  No further evaluation was planned.   PHYSICAL EXAMINATION:  VITAL SIGNS:  On the day of discharge, 105/62,  temperature 98.2, respiratory rate 16, 99% saturation on room air, heart  rate 73 and regular.  NECK:  No jugular venous distention.  LUNGS:  Clear to auscultation bilaterally.  HEART:  PMI not displaced or sustained.  S1 and S2 within normal limits.  No S3, no S4.  ABDOMEN:  Flat.  Positive bowel sounds, normal in frequency and pitch.  EXTREMITIES:  Two plus pulses.  No edema.   PLAN:  Discharge plans are as above.  She will continue the same  medications.  She can take her Lasix slightly more frequently if she is  having any dyspnea.  She does have followup with Dr. Gala Romney.  She  sees Dr. Donnie Coffin as planned.  She sees a radiation oncologist routinely.  She will be asking her radiation oncologist  about getting a flu shot and  Pneumovax.      Rollene [name], MD, [facility]  Electronically Signed     JH/MEDQ  D:  04/22/2007  T:  04/23/2007  Job:  [id number]

## 2010-10-27 NOTE — Assessment & Plan Note (Signed)
[facility]                          CHRONIC HEART FAILURE NOTE   Roy, [name]                      MRN:          [id number]  DATE:06/20/2008                            DOB:          04/23/1971    PRIMARY CARDIOLOGIST:  Bevelyn Buckles. Bensimhon, MD   ONCOLOGY:  Pierce Crane, MD   [name] returns today for further followup of congestive heart failure  secondary to nonischemic cardiomyopathy most likely chemotherapy  induced.  Ejection fraction previously 30%, now 50-55%.  History of  breast cancer status post right mastectomy and chemotherapy.  [name]  has been feeling much better in regards to her previous status.  She  does continue to have intermittent dizziness not always associated with  lysis of orthostatic changes.  This can occur if she suddenly turns too  quickly.  She has been suffering from a sinus infection and saw Dr.  Gala Romney back in November and mentioned this, and asked [name] to  record her blood pressure readings before and after taking her  medications.  [name] did this for a few days but then stopped.  So, did  not bring her readings with her today.  Otherwise, she denies having any  symptoms suggestive of volume overload.   PAST MEDICAL HISTORY:  As stated above, also situational depression,  history of palpitations, previous 21-day heart monitor showing sinus  rhythm, no arrhythmias noted.   REVIEW OF SYSTEMS:  As stated above.   CURRENT MEDICATIONS:  1. Omeprazole 20 b.i.d.  2. Furosemide 20 daily.  3. Carvedilol 25 b.i.d.  4. Gabapentin 300 b.i.d.  5. Lisinopril 10 b.i.d.  6. Tamoxifen 20 daily.  7. Flaxseed oil.  8. Multivitamin.  9. Calcium.  10.Vitamin B.  11.Vitamin D daily.  12.Celexa 40 mg daily.  13.Klor-Con 20 mEq b.i.d.  14.CoQ10 30 mg daily.  15.Magnesium 400 mg daily.  16.Caltrate D daily.   PHYSICAL EXAMINATION:  Weight 220 pounds, weight is up 9 pounds from  November, blood pressure 123/72,  heart rate 79.  [name] is in no acute  distress.  No signs of jugular vein distention at 45 degrees angle.  Lungs are clear to auscultation.  Cardiovascular exam reveals S1 and S2,  regular rate and rhythm.  Abdomen obese, soft, nontender, positive bowel  sounds.  Lower extremities without clubbing, cyanosis, or edema.  Neurologically, alert and oriented x3 with pleasant effect.   IMPRESSION:  Congestive heart failure secondary to nonischemic  cardiomyopathy without signs of volume overload.  [name] continues to  suffer from intermittent dizziness.  I think, I am going to go ahead and  cut her Coreg back to 12.5 mg b.i.d. to see if there is any change in  her symptoms.  Also, in regards to a sinus infection, possible inner ear  disturbance, I have asked her to follow up with her ENT for further  evaluation.  We will check BMET on her today.  She has also complained  of some muscle cramps.      Dorian Pod, ACNP  Electronically Signed      Rollene [name], MD, [facility]  Electronically  Signed   MB/MedQ  DD: 06/20/2008  DT: 06/21/2008  Job #: [id number]

## 2010-10-27 NOTE — Assessment & Plan Note (Signed)
[facility]                          CHRONIC HEART FAILURE NOTE   [name], [name]                      MRN:          [id number]  DATE:09/19/2007                            DOB:          1971/02/14    PRIMARY CARDIOLOGIST:  Jesusita Oka Bensimhon.   ONCOLOGIST:  Ranell Patrick returns today for followup of her congestive heart failure which  is secondary to nonischemic cardiomyopathy, most likely chemotherapy  induced, ejection fraction 20-25% range previously.  Most recent  echocardiogram showing an EF now of 30% with diffuse left ventricular  hypokinesis.  [name] has continued to remain very symptomatic with her  heart failure, class III New York Heart Association symptoms, has had  some mild volume overload and has taken an extra 1-2 doses of her Lasix  weekly with resolution in her symptoms.  Main concern is ongoing  lightheadedness.  She is still stumbling some.  She has not fallen but  stumbling secondary to extreme lower extremity weakness.  She also  complains of situational depression with crying episodes and decreased  interest in activities.  She contributes some of this, though, just to  her general fatigue.  Denies any suicidal ideation or tendencies.  She  is accompanied by her mother today who agrees with the above, as they  spend a large amount of time together.  [name] is pending an  echocardiogram later this month through [facility].  Otherwise,  no change in status.   PAST MEDICAL HISTORY:  1. Congestive heart failure secondary to nonischemic cardiomyopathy      with class III New York Heart Association symptoms.  Patient      previously treated with Adriamycin therapy.  2. Breast cancer status post mastectomy, multiple rounds of      chemotherapy, and radiation therapy.  3. Hypertension.  4. Situational depression.  5. Status post CPX test demonstrating a moderate functional limitation      due primarily to heart  failure.  6. Status post recent Holter monitor secondary to palpitations.   INTERPRETATION:  1. Sinus rhythm, mild sinus tachycardia, no arrhythmias noted  2. Status post recent apneic link with overnight pulse oximetry with      no need for treatment at this time.  3. Sinus tachycardia.  4. 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  5. New onset of falls/stumbling with unclear etiology, the patient      being evaluated by Dr. Vickey Huger.   REVIEW OF SYSTEMS:  As stated above, otherwise negative.   CURRENT MEDICATIONS:  1. Lisinopril 10 mg b.i.d.  2. Digoxin 0.125 mg daily.  3. KCl 20 mEq daily.  4. Coreg 12.5 mg b.i.d.  5. Neurontin 300 b.i.d.  6. Wellbutrin 150 daily.  7. Coumadin per [facility].  8. Tamoxifen daily.  9. Lasix 20 mg daily.  10.Prilosec 20 mg b.i.d.  11.The patient is also taking multiple vitamins including B complex      body rejuvenator.  12.Colon Cleanse.  13.Acai capsules .  14.Flax seed oil and flax seed capsules.  15.Multivitamin.   P.R.N. MEDICATIONS:  Stool  softeners, Ativan, hydrocodone, and  additional Lasix and potassium p.r.n.   PHYSICAL EXAM:  Weight 201, blood pressure 116/64, heart rate 76.  [name] is in no acute distress at this time.  No signs of jugular vein distention.  CARDIOVASCULAR:  S1-S2, regular rate and rhythm.  LUNGS:  Clear to auscultation bilaterally.  ABDOMEN:  Obese, soft, nontender, positive bowel sounds.  LOWER EXTREMITIES:  Without clubbing, cyanosis or edema.  NEUROLOGIC:  Alert and oriented x3.   CLINICAL DATA:  The patient ambulated in the hallway.  Pulse oximetry:  Resting pulse oxygen 99%.  Resting heart rate 78, and the patient  ambulated approximately 300 feet after stopping several times.  Highest  heart rate of 101, pulse oxygen maintained above 95.   IMPRESSION:  1. Congestive heart failure still with class III symptoms.  2. Continue current medications and check lab work today.  3.  Further work up pending results of echocardiogram.     Dorian Pod, ACNP  Electronically Signed      Rollene [name], MD, [facility]  Electronically Signed   MB/MedQ  DD: 09/19/2007  DT: 09/19/2007  Job #: [id number]

## 2010-10-27 NOTE — Assessment & Plan Note (Signed)
[facility]                          CHRONIC HEART FAILURE NOTE   [name], [name]                      MRN:          [id number]  DATE:03/12/2008                            DOB:          06/08/1971    PRIMARY CARDIOLOGIST:  Bevelyn Buckles. Bensimhon, MD   ONCOLOGY:  Pierce Crane, MD   HISTORY OF PRESENT ILLNESS:  [name] returns today for followup of her  congestive heart failure, which is secondary to nonischemic  cardiomyopathy, most likely chemotherapy induced.  Most recent  echocardiogram obtained in May 2009 showed EF 45-50%, previously as low  as 30%.  [name] states she has been doing well since I last saw her in  August.  She continues to try and remain active.  She is now walking 3  miles around 4 days a week.  She states it takes her a while to  accomplish this, but she has been trying to be consistent.  Still having  problems with ongoing depression, actually had an appointment with a  psychiatrist for further management of her depression.  [name] states  she missed the appointment and they want a lump sum paid up front before  she can be seen again.  [name] states she does not have this money, and  she is going to call Dr. Donnie Coffin and get scheduled with another  psychiatrist here in town.  She continues to take the Zoloft 25 mg  daily.  It was recommended that she come off the Zoloft secondary to  recent research showing interference with effectiveness of her  tamoxifen.  She still gets fatigued very easily if she over does it on  the days that she walks of 3 miles.  She states she will come home and  take a nap after walking and then does some light housework and picks up  her daughter from school.  She also has been participant in research  study through Oncology.  [name] states that she is supposed to have an  echocardiogram done every 3 months.  I do not have a documented echo  since May 2009.  [name] is to follow up with Dr. Renelda Loma  office for  further clarification of this.   PAST MEDICAL HISTORY:  1. Congestive heart failure secondary to nonischemic cardiomyopathy      with EF currently 45-50%.  2. Breast cancer status post mastectomy, status post 4 cycles of dose-      dense AC with Avastin, status post 8 cycles of weekly Taxol with      every 3 weeks of Avastin.  She was last treated on March 03, 2007.  3. Secondary chemotherapy-induced peripheral neuropathy.  4. Hypertension.  5. Situational depression.  6. History of palpitations.  Previous 21-day heart monitor showing      sinus rhythm.  No arrhythmias noted.   REVIEW OF SYSTEMS:  As stated above.   CURRENT MEDICATIONS:  1. Omeprazole 20 mg b.i.d.  2. Furosemide 20 mg daily.  3. Klor-Con 20 mEq daily.  4. Carvedilol 25 b.i.d.  5. Gabapentin 300 b.i.d.  6. Lisinopril  10 mg b.i.d.  7. Tamoxifen 20 mg daily.  8. Digoxin 125 mcg daily.  9. Bupropion 150 mg daily.  10.Flaxseed oil daily.  11.Multivitamin.  12.Calcium and vitamin B daily.  13.Vitamin D daily.  14.Zoloft 25 mg nightly.  15.Senokot 2 tabs daily p.r.n.   Medications include Ambien, lorazepam, and hydrocodone.   PHYSICAL EXAMINATION:  VITAL SIGNS:  Weight 209 pounds, weight is down 8  pounds, blood pressure 115/69, heart rate is 92.  GENERAL:  [name] is in no acute distress.  NECK:  No signs of jugular vein distention at 45-degree angle.  LUNGS:  Clear to auscultation bilaterally.  CARDIOVASCULAR:  S1 and S2.  Regular rate and rhythm.  ABDOMEN:  Soft, nontender, positive bowel sounds.  EXTREMITIES:  Lower extremities without clubbing, cyanosis, or edema.  NEUROLOGIC:  Alert and oriented x3.   IMPRESSION:  Congestive heart failure secondary to nonischemic  cardiomyopathy due to chemotherapy-related toxicity.  [name] continues  to improve slowly.  She is euvolemic today, tolerating her current  medications without problems.  Continued to encourage [name] to remain   active, walking.  She needs to follow up with Psychiatry regarding her  depression.  I have told her to stay on the 25 mg of Zoloft until she  makes an appointment with a psychiatrist.  She is going to call Dr.  Renelda Loma office and get a recommendation for another therapist.  Also,  [name] is due for an echocardiogram.  I am going to have her follow up  with Dr. Donnie Coffin to see when they want to have this setup as she is part  of the study.  Otherwise, continue current medications.      Dorian Pod, ACNP  Electronically Signed      Bevelyn Buckles. Bensimhon, MD  Electronically Signed   MB/MedQ  DD: 03/12/2008  DT: 03/13/2008  Job #: [id number]

## 2010-10-27 NOTE — Assessment & Plan Note (Signed)
[facility]                            CARDIOLOGY OFFICE NOTE   Krystal, Roy                      MRN:          [id number]  DATE:03/09/2007                            DOB:          August 31, 1970    REFERRING PHYSICIAN:  Pierce Crane, M.D.   REASON FOR CONSULTATION:  Cardiomyopathy.   HISTORY OF PRESENT ILLNESS:  [name] is a very pleasant 40 year old  woman with no known history of cardiac disease.  She was found to have  breast cancer earlier this year and underwent right mastectomy.  She has  also been treated with Adriamycin and Cytoxan followed by Taxol and now  is starting Avastin and is due to start radiation therapy.  Over the  past few weeks she has had progressive shortness of breath to the point  where she says she is short of breath just at rest.  She does have  perhaps mild orthopnea but no PND.  She has not had any chest pain.  She  has felt fatigued.  Occasional palpitations but no syncope or  presyncope.  She underwent echocardiogram just a couple of days ago,  this was read by Dr. Eden Emms as an ejection fraction of 35% with mild  mitral regurgitation.  A previous echocardiogram from earlier this year  had an EF of 55 to 60%.   REVIEW OF SYSTEMS:  Notable for gastroesophageal reflux disease,  fatigue, constipation, anemia, dyspnea on exertion and allergies.  Remainder of review of systems is negative except for HPI and Problem  List.   PAST MEDICAL HISTORY:  1. Breast cancer as described above, status post mastectomy and      multiple rounds of chemotherapy including Adriamycin.  2. Avastin-induced hypertension.  3. Gastroesophageal reflux disease.  4. Depression.   CURRENT MEDICATIONS:  1. Lexapro.  2. Omeprazole.  3. Norvasc.  4. Neurontin.  5. Multivitamin.   ALLERGIES:  REGLAN.   SOCIAL HISTORY:  1. She works in administration at [employer].  2. Denies any tobacco.  3. Very occasional alcohol use, 1 drink  per month.  4. She is married with 1 child.   FAMILY HISTORY:  1. Mother is alive at age 45.  2. Father is alive at age 40.  75. Sister and brother are both alive and doing well.  4. There is no significant family history of cardiomyopathy.   PHYSICAL EXAM:  She is a bit fatigued appearing but in no acute  distress.  She is able to ambulate around the hall without any  respiratory difficulty.  She does have alopecia related to her  chemotherapy.  Blood pressure is 116/78, heart rate is 101, weight is  210.  HEENT:  Normal except for the alopecia.  NECK:  Supple.  Carotids are 2+ bilaterally without any bruits.  JVD  appears to be about 6 cmH2O.  There is mild thyroid prominence, no  lymphadenopathy appreciated.  CARDIAC:  PMI is nondisplaced.  She has a regular rate, she is  tachycardic with a 2/6 systolic ejection murmur at the left sternal  border.  There is no S3.  LUNGS:  Clear.  ABDOMEN:  Soft, nontender, nondistended.  There is no  hepatosplenomegaly, no bruits, no masses.  Good bowel sounds.  EXTREMITIES:  Warm with no cyanosis or clubbing, there is just trace  edema, no rash.  NEURO:  She is alert and oriented x3.  Cranial nerves II-XII are intact.  Moves all 4 extremities without difficulty.  Affect is pleasant.   EKG shows sinus tachycardia with just mild T wave flattening inferiorly,  rate is 101.   I have reviewed her echocardiogram.  Ejection fraction appears to be  about 45%.  There is a focal wall motion defect in the inferior  posterior wall.  Other walls look relatively normal.   ASSESSMENT AND PLAN:  Cardiomyopathy.  Despite the focality of her wall  motion abnormality, I do think given her age and recent course that this  is chemotherapy-induced cardiomyopathy.  She is complaining of Class III  symptoms but does not have a significant amount of volume overload on  exam, perhaps just minimal.  At this point will start her on low-dose  Lasix at 20 mg a day and  some potassium.  Would also start her on Coreg  3.125 twice daily and lisinopril 10 milligrams a day by mouth.  I will  ask her to follow up with Dorian Pod in the Heart Failure Clinic  every 2 weeks for titration of her medications.  We can also consider  adding digoxin if she needs it symptomatically.  I have talked to Dr.  Donnie Coffin and asked them to hold off on further chemotherapy and radiation  for at least 6 weeks until we can sort of establish a baseline with her.  We will check some labs in the next week or two including a thyroid  panel, a BNP and some electrolytes.  We did stop her Norvasc in order to  allow room for a beta-blocker and ACE inhibitor.   I spent nearly 45 minutes to an hour with [name] and her mother  reviewing her case as well as showing her her echocardiogram and the one  from previously.     Bevelyn Buckles. Bensimhon, MD  Electronically Signed   DRB/MedQ  DD: 03/09/2007  DT: 03/10/2007  Job #: [id number]

## 2010-10-27 NOTE — Assessment & Plan Note (Signed)
[facility]                            CARDIOLOGY OFFICE NOTE   [name], [name]                      MRN:          [id number]  DATE:10/26/2007                            DOB:          1970-10-15    ONCOLOGIST:  Dr. Pierce Crane.   INTERVAL HISTORY:  [name] is a very pleasant 40 year old woman with a  history of breast cancer status post mastectomy, chemotherapy and  radiation therapy.  She also has a history of hypertension and  congestive heart failure secondary to nonischemic cardiomyopathy likely  related to her chemotherapy.   She returns today for routine followup.  Overall she is doing fairly  well.  She feels that her exercise tolerance is much improved.  She has  not had any problems with edema.  No orthopnea or PND.  Over the past  few days, though, she has felt somewhat run down with some nausea and  abdominal bloating.  Both her mother and daughter had the same thing.  She has not had any diarrhea.  No fevers or chills.   CURRENT MEDICATIONS:  1. Include lisinopril 10 b.i.d.  2. Zoloft 50 a day.  3. Coreg 12.5 in the morning 18.75 at night.  4. Digoxin 0.125 a day.  5. Potassium 20 a day.  6. Neurontin 300 b.i.d.  7. Wellbutrin 150 a day.  8. Flaxseed.  9. Coumadin.  10.Tamoxifen.  11.Lasix 20 a day.  12.Prilosec 20 b.i.d.  13.Multivitamin.   PHYSICAL EXAM:  She is well-appearing no acute distress and ambulates  around the clinic without respiratory difficulty.  Blood pressure is 103/62, heart rate 79, weight is 208.  HEENT is normal.  Neck is supple.  There is no JVD.  Carotids are 2+ bilateral without  bruits.  There is no lymphadenopathy or thyromegaly.  CARDIAC:  PMI is nondisplaced.  She is regular with an S4 no murmurs.  LUNGS:  Clear.  Abdomen is obese, nontender, nondistended.  There is no  hepatosplenomegaly.  No bruits, no masses.  Good bowel sounds.  EXTREMITIES:  Warm with no cyanosis, clubbing or edema.  NEURO:  She is alert and oriented times three.  Cranial nerves II-XII  are intact.  Moves all four extremities without difficulty.  Affect is  mildly flattened but appropriate.   EKG shows normal sinus rhythm at a rate of 71 with diffuse T-wave  flattening.  Echocardiogram shows an ejection fraction of 45-50%.  This  was done on Oct 18, 2007.  There is evidence of diastolic dysfunction  with a pseudo-normal filling pattern.   ASSESSMENT/PLAN:  Congestive heart failure secondary to nonischemic  cardiomyopathy, ejection fraction is much improved.  She is NYHA class  II to III.  I have encouraged her to start walking program and become  more active.  I suspect she will get complete recovery of her LV  function.  We will increase her Coreg up to 18.75 b.i.d.   DISPOSITION:  She will follow up with Dorian Pod in the Heart  Failure Clinic in about a month.     Bevelyn Buckles. Bensimhon, MD  Electronically Signed    DRB/MedQ  DD: 10/26/2007  DT: 10/26/2007  Job #: [id number]   cc:   Pierce Crane, MD

## 2010-10-27 NOTE — Assessment & Plan Note (Signed)
[facility]                          CHRONIC HEART FAILURE NOTE   NAVY, ROTHSCHILD                      MRN:          [id number]  DATE:10/16/2007                            DOB:          01/19/1971    PRIMARY CARDIOLOGIST:  Bevelyn Buckles. Bensimhon, MD.   ONCOLOGY:  Pierce Crane, MD.   [name] returns today for follow-up of her congestive heart failure  which is secondary to nonischemic cardiomyopathy, most likely  chemotherapy induced.  Most recent echocardiogram done on August 17, 2007, EF of 30% at that time, up from 25% in November 2008.  [name] has  an appointment with Dr. Donnie Coffin on Wednesday.  She also has a follow-up  echocardiogram scheduled for Oct 26, 2007, and an appoint with Dr.  Gala Romney at that time.  Latrece continues to complain of ongoing  fatigue.  She has to pace herself with her daily activities.  She also  has occasional lightheadedness and dizziness, continues to stumble when  she walks but has not had any falls.  States compliance with medication.  Experiences occasional volume overload and is very good about titrating  her furosemide and potassium as needed.   PAST MEDICAL HISTORY:  1. Congestive heart failure secondary to nonischemic cardiomyopathy      with Class III [employer] [place] Heart Association symptoms.  Patient      previously treated with Adriamycin therapy.  Ejection fraction at      this time is 30%.  A 2-D echocardiogram follow-up pending.  2. Breast cancer status post mastectomy, multiple rounds of      chemotherapy and radiation therapy.  3. History of hypertension.  4. Situational depression.  5. Status post CPX test demonstrated moderate functional limitation      due primarily to heart failure.  6. Status post recent Holter monitor secondary to palpitations.  7. Status post recent apneic link with overnight pulse oximetry with      no need for treatment at this time.  8. Sinus tachycardia.  9. 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  10.New onset of falls and stumbling with unclear etiology.  The      patient evaluated by Dr. Vickey Huger.   REVIEW OF SYSTEMS:  As stated above, otherwise negative.   CURRENT MEDICATIONS:  1. Lisinopril 10 mg b.i.d.  2. B complex vitamin daily.  3. Digoxin 0.125 mg daily.  4. KCl 20 mEq daily.  5. Body rejuvenator daily.  6. Coreg 12.5 mg b.i.d.  7. Neurontin 300 mg b.i.d.  8. Wellbutrin 150 mg daily.  9. Flaxseed oil daily.  10.Coumadin per [facility].  11.Tamoxifen daily.  12.Multivitamin daily.  13.Lasix 20 mg daily.  14.Prilosec 20 mg b.i.d.  15.Zoloft 25 mg daily.  16.Her p.r.n. medications include Restoril 15 mg, Lasix 40 mg and KCl      40 mEq.   PHYSICAL EXAMINATION:  VITAL SIGNS:  Weight 202 pounds, blood pressure  125/80.  Heart rate 102.  GENERAL APPEARANCE:  Joleene is in no acute distress.  NECK:  No signs of jugular vein distention at 45 degrees angle.  LUNGS:  Clear to auscultation bilaterally.  CARDIOVASCULAR:  Exam reveals S1 and S2.  Regular rhythm, slightly  tachycardic.  ABDOMEN:  Soft, nontender, positive bowel sounds.  LOWER EXTREMITIES:  Without clubbing, cyanosis or edema.  NEUROLOGIC:  Alert and oriented x3.   IMPRESSION:  Congestive heart failure secondary to nonischemic  cardiomyopathy with ejection fraction currently 30%.   PLAN:  Titrate Hanah's carvedilol again.  I am concerned about her  ongoing stumbling and history of falls.  She says she does not feel  dizzy when this occurs that her legs just give out.  I am going to have  her increase her carvedilol to 18.375 mg at bedtime.  Continue 12.5 in  the morning.  If she tolerates this for this week, will increase the  morning dose to 18.375 also.  Continue with Lasix 20 mg daily and use  the p.r.n. dose as needed along with potassium.  Would check a  magnesium, BMET, BNP and TSH today and have Keandrea follow up with Dr.  Gala Romney.  Hopefully  can titrate her Coreg up to 25 mg b.i.d. without  extreme side effects.  If not, will need to refer the patient to  electrophysiology for consideration of implantable cardioverter  defibrillator, if no improvement in ejection fraction.     Dorian Pod, ACNP  Electronically Signed      Bevelyn Buckles. Bensimhon, MD  Electronically Signed   MB/MedQ  DD: 10/16/2007  DT: 10/16/2007  Job #: [id number]   cc:   Pierce Crane, MD

## 2010-10-27 NOTE — H&P (Signed)
NAMEFAYETTE, [name] NO.:  [id number]   MEDICAL RECORD NO.:  [id number]          PATIENT TYPE:  EMS   LOCATION:  [facility]                         FACILITY:  [facility]   PHYSICIAN:  Noralyn Pick. Eden Emms, MD, FACCDATE OF BIRTH:  January 15, 1971   DATE OF ADMISSION:  05/02/2008  DATE OF DISCHARGE:                              HISTORY & PHYSICAL   CHIEF COMPLAINT:  Shortness of breath, weakness, and dizziness.   HISTORY OF PRESENT ILLNESS:  Ms. Simcoe is a 40 year old lady with  history of non-ischemic cardiomyopathy with EF of 45-50% on May 2009 and  hypertension, came with complaint of feeling weakness, short of breath,  and dizziness for the last 2 weeks.  She usually walks about 3-4 miles  per day about 4 days a week, but has not been able to do so for the last  2 weeks.  There is no chest pain, fever, chills, or vomiting.  She had  had some perspiration and nausea.  She usually uses 3 pillows and need  to use it more recently.  There is no leg swelling.  There is no sore  throat or runny nose.  She did use some extra dose of Lasix for the last  few days, but it did not help.   ALLERGIES:  She is allergic to [facility], which causes anxiety attack.   HOME MEDICATIONS:  1. Omeprazole 20 mg p.o. b.i.d.  2. Lasix 20 mg p.o. daily.  3. Potassium 20 mg p.o. daily.  4. Carvedilol 25 mg p.o. b.i.d.  5. Gabapentin 300 mg p.o. b.i.d.  6. Lisinopril 10 mg p.o. b.i.d.  7. Tamoxifen 20 mg p.o. daily.  8. Digoxin 125 mcg p.o. daily.  9. __________ 150 mg p.o. daily.  10.Celexa, dose unknown.  11.Flaxseed oil.  12.Multivitamins.  13.Calcium and vitamin B.  14.Vitamin D.  15.Senokot 2 tablets p.r.n.  Other p.r.n. medications include Ambien      and lorazepam.   PAST MEDICAL HISTORY:  1. Non-ischemic cardiomyopathy, thought to be chemotherapy induced      with EF of 45-50%.  2. Breast cancer status post mastectomy and chemotherapy, lasted on      September 2008.  3.  Chemotherapy-induced peripheral neuropathy.  4. Hypertension.  5. Depression.  6. Palpitation with normal sinus rhythm only on 21 days of heart      monitor workup.   SOCIAL HISTORY:  Ms. Haider lives in [place] with her husband and  daughter.  She is not working currently.  She has a negative history for  tobacco, alcohol, or illicit drug use.   FAMILY HISTORY:  Positive for heart attacks in grandmother and uncles.   REVIEW OF SYSTEMS:  As per HPI.   PHYSICAL EXAMINATION:  VITAL SIGNS:  Temperature 97.5; pulse 65;  respirations 16; blood pressure 135/104; repeat with 111/63; and oxygen  saturation 96 on room air.  GENERAL:  She is not in distress.  HEENT:  PERRLA.  EOMI.  Oropharynx pink and moist without erythema or  exudate.  NECK:  Supple.  No JVD or carotid bruits.  CARDIOVASCULAR:  First and second  heart sound normal.  Regular rhythm.  No rubs, murmurs, or gallops.  CHEST:  Clear to auscultation bilaterally.  No crackles or wheeze.  ABDOMEN:  Soft, nontender, and nondistended.  No organomegaly.  EXTREMITIES:  No pitting pedal edema.  NEUROLOGIC:  Alert and oriented x4.   LABORATORY DATA:  WBC 5.1 with absolute neutrophil count of 3,  hemoglobin 12.3, and platelets 198.  Sodium 138, potassium 3.8, chloride  105, bicarb 27, BUN 9, creatinine 0.68, glucose 95, and calcium 8.9.  Hepatic function, total bilirubin 0.4, direct less than 0.1, alk phos  62, AST 26, ALT 18, total protein 6.9, and albumin 3.7.  Digoxin level  0.4.  BNP 43.  Chest x-ray, PA and lateral view, no active disease.  A  12-lead EKG is positive for some ST depression in lead 2 and lead 3 with  normal sinus rhythm, normal axis, and a rate of 65 beats per minute.  The ST changes in lead 2 and lead 3 are persistent, and unchanged as  compared to the last one done in May of this year.   ASSESSMENT AND PLAN:  Ms. Greenlaw is a 40 year old lady with history of  non-ischemic cardiomyopathy and hypertension who  came to the emergency  room with compliant of shortness of breath, dizziness, and weakness.  Concern for congestive heart failure.  Clinically, radiographically, and  per BNP, she is not having any congestive heart failure at this time.  She might have other noncardiac cause of shortness of breath.  We feel  comfortable to send her home on her regular dose of medications.  She  does need to follow up with her medical doctor for further workup for  shortness of breath and dizziness.      Jason Coop, MD  Electronically Signed      Noralyn Pick. Eden Emms, MD, [facility]  Electronically Signed    YP/MEDQ  D:  05/02/2008  T:  05/03/2008  Job:  [id number]   cc:   Bevelyn Buckles. Bensimhon, MD  [facility]

## 2010-10-27 NOTE — Assessment & Plan Note (Signed)
[facility]                            CARDIOLOGY OFFICE NOTE   [name], [name]                      MRN:          [id number]  DATE:06/20/2007                            DOB:          01/01/1971    ONCOLOGIST:  Dr. Pierce Crane.   INTERVAL HISTORY:  [name] is a 40 year old woman with a history of  breast cancer, hypertension.  She has followed in the Heart Failure  Clinic for congestive heart failure secondary to presumed nonischemic  cardiomyopathy related to her chemotherapy.   She returns today for routine followup.  She says she continues to feel  progressively weak.  She got out of bed today to try and help her  daughter to school, and said she just could not go.  She does have some  occasional shortness of breath with this, but it is mostly a fatigue.  She denies any chest pain.  No significant lower extremity edema.  No  orthopnea.  No PND.  Her weight is up about 4 pounds.  She is obviously  very upset and continues to be depressed over her situation.   CURRENT MEDICATIONS:  1. Digoxin 0.25 a day.  2. Lisinopril 10 in the morning and 5 at night.  3. Coreg 9.375 b.i.d.  4. Coumadin.  5. Potassium 20 a day.  6. Ativan.  7. Tamoxifen.  8. Lexapro 20.  9. Neurontin 300 a day.  10.Ambien p.r.n.  11.Lasix 20 a day.  12.Prilosec 20 b.i.d.   PHYSICAL EXAM:  She is fatigued-appearing but in no acute distress.  Ambulates around the clinic without any respiratory difficulty.  Blood pressure is 100/78, heart rate is 84, weight 207.  HEENT:  Normal.  NECK:  Supple.  No obvious JVD.  Carotids are 2+ bilaterally without any  bruits.  There is no lymphadenopathy or thyromegaly.  CARDIAC:  PMI is somewhat laterally displaced.  She has a regular rate  and rhythm.  No obvious murmur, rub, or gallop.  I do not appreciate an  S3.  LUNGS:  Clear.  ABDOMEN:  Obese, nontender, nondistended.  No hepatosplenomegaly.  No  bruits.  No masses.  Good  bowel sounds.  EXTREMITIES:  Warm with no cyanosis, clubbing, or edema.  NEUROLOGIC:  She is alert and oriented x3.  Cranial nerves 2-12 are  intact.  Moves all 4 extremities without difficulty.  Affect is  flattened.   EKG shows sinus rhythm at a rate of 85.  No ST-T wave abnormalities.   ASSESSMENT AND PLAN:  Congestive heart failure secondary to presumed  nonischemic cardiomyopathy.  By history, she is presenting with class  IIIB symptoms.  However, her subjective complaints seem to somewhat  outweigh her objective findings.  At this point, I think we need to  proceed with cardiopulmonary exercise testing to get a clear  understanding of what her functional capacity truly is.  We may also  need to consider right heart catheterization.  We will try to set her  cardiopulmonary exercise test up in the next few days.  We will keep her  medications where they  are at for now.  She will continue to follow in  the Heart Failure Clinic for medication titration.  I do suspect, she  understandably may have a large component of depression here as well,  and we may need to be more aggressive with her SSRI.     Krystal Buckles. Bensimhon, MD  Electronically Signed    DRB/MedQ  DD: 06/20/2007  DT: 06/20/2007  Job #: [id number]   cc:   Pierce Crane, MD

## 2010-10-27 NOTE — Assessment & Plan Note (Signed)
[facility]                          CHRONIC HEART FAILURE NOTE   SARRA, RACHELS                      MRN:          [id number]  DATE:03/23/2007                            DOB:          24-May-1971    PRIMARY CARDIOLOGIST:  Bevelyn Buckles. Bensimhon, M.D.   ONCOLOGIST:  Pierce Crane, M.D.   PRIMARY CARE PHYSICIAN:  Patient is followed by [facility].   HISTORY OF PRESENT ILLNESS:  Ms. Brummitt is a very pleasant 40 year old  African-American female with no known history of cardiac disease,  however recently diagnosed with breast cancer, status post right  mastectomy.  She has also been treated with Adriamycin and Cytoxan  followed by Taxol and is now pending further chemotherapeutic agent,  Avastin, and is also due to start radiation therapy.  Prior to her  chemotherapy she underwent a baseline echocardiogram that showed an  ejection fraction of 50 to 60%.  However, since her chemotherapy  treatments she has complained of increased shortness of breath,  underwent a repeat echocardiogram that showed a decrease in her ejection  fraction to 35% with mild mitral regurgitation.  The patient was  evaluated by Dr. Gala Romney as a new patient on March 09, 2007.  He  spent a significant amount of time with the patient and her mother going  over her echocardiogram, educating her about medications and beginning  her medication regimen for her heart failure and arranged for her to  follow up here in the heart failure clinic today as a new patient.  Caleb states she is doing okay.  She is trying to come to terms with  her new diagnosis in addition to her breast cancer.  She still complains  of ongoing fatigue but denies orthopnea or PND.  No peripheral edema.  She is resuming her radiation therapy on Monday.  I spoke with Dr.  Gala Romney who approved the radiation therapy so this will be resumed as  previously planned.   FAMILY/SOCIAL  HISTORY:  Ms. Bebee is a very pleasant young lady.  She  lives  in [place] with her husband.  She is accompanied by her mother  today, who is alive and well at age 78.  She does have a history of  mitral valve prolapse and states she has to have a yearly EKG done, is  not routinely followed by a cardiologist.  Her father is also alive  without any cardiac problems.   PAST MEDICAL HISTORY:  1. Includes breast cancer as described above, status post mastectomy      and multiple rounds of chemotherapy including Adriamycin, pending      radiation therapy.  2. Hypertension.  3. GERD.  4. Situational depression.   REVIEW OF SYSTEMS:  As stated above.   CURRENT MEDICATIONS:  1. Lexapro.  2. Prilosec.  3. Stool softener.  4. Vitamin B.  5. Neurontin.  6. Multivitamin.  7. Coumadin.  8. Lorazepam.  9. Lasix 20 mg daily.  10.K-Dur 20 mEq daily.  11.Coreg 3.125 mg b.i.d.  12.Lisinopril 10 mg daily.   PHYSICAL EXAMINATION:  VITAL SIGNS:  Weight 206.  Blood pressure 118/70  with heart rate of 74.  GENERAL APPEARANCE:  Carlyn is in no acute distress.  Very pleasant  young lady.  NECK:  Without signs of jugular venous distention.  CARDIOVASCULAR:  Exam reveals an S1, S2, slightly tachycardic with a 2/6  systolic ejection murmur.  No S3 heard.  LUNGS:  Clear to auscultation.  ABDOMEN:  Soft, nontender.  Positive bowel sounds.  EXTREMITIES:  Lower extremities without cyanosis, clubbing or edema.   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  I have initiated education with Brylei and her mother.  Adjustments in medication include lisinopril.  She is going to break up  the 10 mg tablet and take 5 mg in the morning and 5 mg in the evening.  Her carvedilol she will continue with 3.125 mg in the A.M. and at  bedtime will take two of the 3.125 mg tablets.  She will follow up  with  me in two weeks for re-evaluation.  She has my phone number.  She  knows  to call me if she has any problems or questions about her diagnosis.  We  will continue to titrate medication as tolerated.  I have placed her on  a 2,000 cc fluid restriction and a 3 gram sodium diet.      Dorian Pod, ACNP  Electronically Signed      Rollene [name], MD, [facility]  Electronically Signed   MB/MedQ  DD: 03/23/2007  DT: 03/24/2007  Job #: [id number]   cc:   Pierce Crane, MD

## 2010-10-27 NOTE — Assessment & Plan Note (Signed)
[facility]                          CHRONIC HEART FAILURE NOTE   NITZA, SCHMID                      MRN:          [id number]  DATE:05/26/2007                            DOB:          1971/02/12    PRIMARY CARDIOLOGIST:  Dr. Nicholes Mango.   ONCOLOGY:  Dr. Donnie Coffin.   [name] returns today for followup of her congestive heart failure  status post recent hospitalization for treatment of pneumonia.  I have  spoken with [name] several times by phone since she was discharged  home, and have seen her once in followup.  Intially after  hospitalization, she continued to have a rather intense cough productive  of thick yellow sputum.  Denies any fever.  Complains of some chills and  sweats still, and just generalized weakness.  Today, she states she is  feeling better.  She is still coughing some.  Still productive at times,  but states it is not as intense as it was.  New problem today is that  she has started to have episodes of falling.  She denies any light-  headedness, dizziness.  No pre-syncopal symptoms.  She states she can  just be walking and then the next thing her body just gives out and she  stumbles, or she just falls backwards if she is standing up from a chair  or forward if she is walking.  She may catch herself in a doorway to  regain her balance.  This has happened on several occasions now.  Fortunately, she has not injured herself.  She also continues to  complain of ongoing numbness and tingling in her hands and feet.  This  is not a new finding.  This began after receiving her tamoxifen and has  not progressed.  [name]'s shortness of breath is back to baseline.  She  does have dyspnea on exertion.  Denies any orthopnea or PND.  No  peripheral edema.  Appetite has been good.  Denies any diarrhea.  Has  her next appointment with Dr. Donnie Coffin at the end of December.   PAST MEDICAL HISTORY:  1. Congestive heart failure secondary to  nonischemic cardiomyopathy      with class III New York Heart Association symptoms in the setting      of previous Adriamycin therapy.  2. Breast cancer status post mastectomy.  Multiple rounds of      chemotherapy including Adriamycin, now undergoing radiation      therapy.  3. Hypertension.  4. GERD.  5. Situational depression.  6. Status post recent hospitalization for acute on chronic      exacerbation of heart failure and pneumonia, at which time she also      had a non-functioning Port-a-Cath that was treated with TPA.  7. Sinus tachycardia.  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  9. Most recent echocardiogram November of 2008, EF 20-25%.  10.New onset of falls with unclear etiology at this time.   REVIEW OF SYSTEMS:  As stated above.   CURRENT MEDICATIONS:  1. Potassium 20 mEq daily.  2. Coumadin as directed by Dr. Donnie Coffin.  The  patient states she takes 1      mg daily.  3. Coreg 0.375 mg b.i.d.  4. Ativan 1 mg daily.  5. Tamoxifen daily.  6. Lexapro 20 mg daily.  7. Neurontin 300 mg daily.  8. Multivitamin daily.  9. Stool softener p.r.n.  10.Ambien p.r.n.  11.Lasix 20 mg daily.  12.Lisinopril 5 mg b.i.d.  13.Prilosec 20 mg b.i.d.   P.R.N. MEDICATIONS:  Delsym or Tussionex.  Hydrocodone.   PHYSICAL EXAM:  Weight 201 pounds, blood pressure 101/71 with a heart  rate of 98.  Chen is in no acute distress.  Neck veins are flat.  LUNGS:  She had some scattered rhonchi.  Otherwise, clear to  auscultation.  CARDIOVASCULAR:  Reveals S1, S2.  Slightly tachycardic.  ABDOMEN:  Soft and nontender with positive bowel sounds.  LOWER EXTREMITIES:  Without cyanosis, clubbing, or edema.  NEUROLOGIC:  She is alert and oriented x3.  Ambulating in the clinic  without assistance at a slow pace.  No neurological deficits noted.   IMPRESSION:  Congestive heart failure without signs of volume overload  at this time.  Will check BNP and BMET today.  [name] continues to have   a productive cough status post recent hospitalization for pneumonia.  Would repeat chest x-ray today for followup.  I am more concerned that  [name] has begun having episodes of stumbling and falling.  This is a  new change for her.  I have discussed with Dr. Gala Romney.  We feel that  the patient needs a neurological evaluation and will arrange this as  soon as possible.  [name] is to continue followup with Dr. Donnie Coffin for  treatment of her breast cancer and I am going to hold off on titrating  her Coreg up to 12.5 mg b.i.d. today in the setting of recent lower  extremity weakness and falls,  and will wait for neurological evaluation.  Will request that a copy of  her lab work and chest x-ray be sent to Dr. Donnie Coffin and also to neurology.     Dorian Pod, ACNP  Electronically Signed      Bevelyn Buckles. Bensimhon, MD  Electronically Signed   MB/MedQ  DD: 05/26/2007  DT: 05/26/2007  Job #: [id number]

## 2010-10-27 NOTE — Assessment & Plan Note (Signed)
[facility]                          CHRONIC HEART FAILURE NOTE   Krystal, Roy                      MRN:          [id number]  DATE:04/12/2007                            DOB:          [birth year]    PRIMARY CARDIOLOGIST:  Bevelyn Buckles. Bensimhon, M.D.   PRIMARY CARE PHYSICIAN:  [facility].   PRIMARY ONCOLOGIST:  Pierce Crane, M.D.   Beverly returns today for further follow-up of her nonischemic  cardiomyopathy with class III New York Heart Association symptoms.  We  previously saw [name] as a new patient earlier this month.  I found her  to be a very pleasant young lady undergoing treatment for breast cancer  resulting in chemotherapy induced cardiomyopathy.  [name] did not have  any signs of volume overload at that time and I began her medication  titration regimen and initiated her education.  [name] states she has  been doing quite well since I last saw her.  She does complain of  occasional dizziness that is very sporadic and lasts only minutes.  She  is concerned maybe her blood pressure has been too low.  She has had  different readings checked at various times.  On October 10 it was  115/75 with a heart rate of 89.  October 23 it was 110/65 with a heart  rate of 74.  October 27 it was 113/72 with a heart rate of 81.  When I  last saw [name] I had her increase her p.m. dose of Coreg to 6.25 mg  and continue the morning dose at 3.125 mg until I reevaluated her.  I  asked her to change her lisinopril to 5 mg b.i.d.  Besides the  occasional light-headedness and ongoing fatigue, she shows no signs of  increased volume overload, orthopnea, PND, chest discomfort or  palpitations, presyncope or syncopal episodes.  She states she underwent  a radiation treatment this morning.  She is, however, complaining of  some pain in her pelvic area around her ovaries and lower back.  She has  an appointment this afternoon with Dr.  Stefano Gaul at 3:45 to have this  evaluated.   PAST MEDICAL HISTORY:  1. Nonischemic cardiomyopathy with class III New York Heart      Association symptoms, most likely secondary to chemotherapy.  2. Breast cancer, status post mastectomy.  Multiple rounds of      chemotherapy including Adriamycin, now undergoing radiation      therapy.  3. Hypertension.  4. GERD.  5. Situational depression.   REVIEW OF SYSTEMS:  As stated above.   CURRENT MEDICATIONS:  1. Lexapro 20.  2. Prilosec 20.  3. Vitamin B.  4. Multivitamin.  5. Coumadin per primary care physician.  6. Lasix 20.  7. K-Dur 20.  8. Coreg 3.125 in the a.m. and 6.25 in the evening.  9. Gabapentin 300 at bedtime.  10.Lisinopril 5 mg b.i.d.   PHYSICAL EXAMINATION:  Weight 199 pounds.  The patient's weight is down  seven pounds since October 9.  Blood pressure 111/69 with a heart rate  of 70.  [name]  was in no acute distress.  NECK:  No signs of jugular vein distention at 45 degree angle.  LUNGS:  Clear to auscultation bilaterally.  CARDIOVASCULAR:  Reveals an S1 and S2, regular rate and rhythm.  ABDOMEN:  Soft, nontender.  Positive bowel sounds.  EXTREMITIES:  Lower extremities without clubbing, cyanosis, or edema.  NEUROLOGIC:  Alert and oriented x3.  SKIN:  Warm and dry.   CLINICAL DATA:  I ambulated [name] greater than 300 feet.  Resting  pulse ox 98%, resting heart rate 72.  Pulse ox remained stable  throughout walk.  Heart rate ranged from 72 to 110.  [name] had to stop  twice during the walk to catch her breath.  She complained of very  minimal light-headedness at end of walk.  Blood pressure on return to  room was 98/62.  No further episodes of light-headedness or dizziness  during visit.   IMPRESSION:  1. Cardiomyopathy, most likely secondary to chemotherapy with an EF      currently 35%, previous normal ejection fraction prior to      initiation of chemotherapy.  [name] has no signs of volume       overload.  In fact, I suspect that she is mildly dry.  I have asked      to change her Lasix to every other day and add an additional glass      of fluid to her intake each day.  I am going to go ahead and      increase her Coreg to 6.25 mg b.i.d.  [name] states she already      has an echocardiogram scheduled for November 3 which I did not      schedule.  Will go ahead and keep that appointment and I will see      her back in three weeks for further evaluation.      She knows to call me if she experiences any further light-      headedness or dizziness.  Will need to titrate beta blocker therapy      slowly.      Dorian Pod, ACNP  Electronically Signed      Bevelyn Buckles. Bensimhon, MD  Electronically Signed   MB/MedQ  DD: 04/12/2007  DT: 04/13/2007  Job #: [id number]   cc:   Pierce Crane, MD  [facility]

## 2010-10-27 NOTE — H&P (Signed)
NAMEKANA, REIMANN NO.:  [id number]   MEDICAL RECORD NO.:  [id number]          PATIENT TYPE:  INP   LOCATION:  1429                         FACILITY:  [facility]   PHYSICIAN:  Jesse Sans. Wall, MD, FACCDATE OF BIRTH:  1970-07-12   DATE OF ADMISSION:  04/21/2007  DATE OF DISCHARGE:  04/22/2007                              HISTORY & PHYSICAL   I was asked by Dr. Donnetta Hutching in the emergency room to evaluate [name] with progressive shortness of breath and tachycardia.   HISTORY OF PRESENT ILLNESS:  [name] is an unfortunate 40 year old,  very pleasant, married, black female who carries a diagnosis of a  nonischemic cardiomyopathy secondary to Adriamycin.  She is a regular  patient in our congestive heart failure with Dr. Nicholes Mango.   She was diagnosed with breast cancer in February.  She has had  chemotherapy with Adriamycin with her last treatment being six weeks  ago.  She just started radiation therapy.   Over the last week or so she has had progressive shortness of breath.  She was over with her mother today at [facility] and became a  little more short of breath.  She has some sharp pain in her right upper  chest.  She came to the emergency room where her vital signs were  117/67, her heart rate was 78 and sinus rhythm, temperature was 97.4,  respiratory rate was 18, and O2 saturation was 98% on room air.  An EKG  showed no significant changes.  She subsequently had a chest x-ray which  showed a globular heart with some vascular congestion.  There were no  effusions.  The question of pericardial effusion was raised.   Of note she just had an echocardiogram in our office April 17, 2007.  This showed an EF of 20-25% which is down from 35% recently.  Left  ventricle was mildly dilated.  She had global hypokinesia, minimal  aortic insufficiency, mitral regurgitation, and mild left atrial  enlargement.  Specifically, there was no pericardial  effusion.   PAST MEDICAL HISTORY:  1. Significant for allergy to [facility].  2. She has a history of gastroesophageal reflux.  3. Hypertension.  4. Breast cancer, status post mastectomy on the right.   CURRENT MEDICATIONS:  1. Lexapro 20 mg a day.  2. Prilosec 20 mg b.i.d.  3. Vitamin B.  4. Multivitamin daily.  5. Coumadin 1 mg per day.  6. Lasix 20 mg p.o. daily.  7. K-Dur 20 mEq a day.  8. Coreg 3.125 mg in the morning, 6.25 mg in the evening.  9. Gabapentin 300 mg at bedtime.  10.Lisinopril 5 mg p.o. b.i.d.  11.Ambien 5 mg p.o. at bedtime for sleep.   SOCIAL HISTORY:  She does not use any drugs.  She does not drink.  She  does not smoke.  She has one child that is 3 years old.  I met her  husband tonight.   REVIEW OF SYSTEMS:  She denies any fever, chills, cough, hemoptysis,  increased lower extremity edema.  She has had some mild  abdominal  bloating.   PHYSICAL EXAMINATION:  Her blood pressure is 120/70 with a manual cuff  in the left arm.  Her pulse's paradox is less than 4, checked twice by  me.  Her pulse is 72 in sinus rhythm.  Respiratory rate is 18-20 and  unlabored.  She is afebrile.  She is slightly anxious.  She is wearing 2 L of O2.  SKIN:  Warm and dry.  HEENT:  Normocephalic, atraumatic.  She has significant hair loss.  It  is growing back.  She has some graying pattern.  PERRLA.  Extraocular  movements intact.  Sclerae clear.  Facial symmetry is normal.  Dentition  is satisfactory.  NECK:  Negative for any significant JVD.  There may be a little  hepatojugular reflux.  Thyroid is not enlarged.  Trachea is midline.  There is no obvious adenopathy.  LUNGS:  Clear to auscultation posteriorly throughout.  There were no  rubs.  HEART:  Reveals a slightly displaced inferolateral PMI.  There is no S3  gallop.  There is normal S1/S2.  There is no rub.  ABDOMEN:  Soft.  Good bowel sounds.  There is no tenderness.  EXTREMITIES:  Reveal no edema.  Pulses are  intact.  NEUROLOGIC:  Exam is grossly intact.  SKIN:  Unremarkable.   LABORATORY DATA:  Significant for hemoglobin of 12.7, normal white  count, normal PT and PTT.  Normal point of care markers.  Sodium 142,  potassium 3.8, BUN of 9, creatinine of 0.74.  Normal LFTs.  Albumin 3.9.  She has a fibrin sheath at the tip of her indwelling catheter with  inability to aspirate.  GTA dwell recommended.   Her BNP was 94.5.   ASSESSMENT:  1. Mild acute on chronic systolic congestive heart failure.  2. No clinical evidence or echo evidence of a tamponade.  3. Nonischemic cardiomyopathy secondary to Adriamycin chemotherapy.  4. Breast cancer, status post right mastectomy, ongoing radiation      therapy at the present time.  5. Anxiety.  6. Noncardiac chest pain.   PLAN:  1. Admit to observation telemetry.  2. IV Lasix 20 mg x1 tonight.  3. Continue all of the medications.  4. Ativan p.r.n. for anxiety.  5. TPA indwell for her Port-A-Cath clot.  6. Probable discharge in the morning.   I have discussed the plan with her and her husband.  They agree with the  plan and feel comfortable being admitted for observation.      Thomas C. Daleen Squibb, MD, [facility]  Electronically Signed     TCW/MEDQ  D:  04/21/2007  T:  04/22/2007  Job:  [id number]   cc:   Bevelyn Buckles. Bensimhon, MD  [address] [place] [address]   [facility]   Pierce Crane, MD  Fax: [phone]

## 2010-10-27 NOTE — Op Note (Signed)
[name], [name]               ACCOUNT NO.:  [id number]   MEDICAL RECORD NO.:  [id number]          PATIENT TYPE:  AMB   LOCATION:  DSC                          FACILITY:  [facility]   PHYSICIAN:  Thomas A. Cornett, M.D.DATE OF BIRTH:  12/21/1970   DATE OF PROCEDURE:  12/22/2007  DATE OF DISCHARGE:                               OPERATIVE REPORT   PREOPERATIVE DIAGNOSIS:  History of breast cancer as well as internal  jugular Port-A-Cath.   POSTOPERATIVE DIAGNOSIS:  History of breast cancer as well as internal  jugular Port-A-Cath.   PROCEDURE:  Explantation of left-sided Port-A-Cath.   SURGEON:  Maisie Fus A. Cornett, MD.   ANESTHESIA:  MAC with 0.25% Sensorcaine.   ESTIMATED BLOOD LOSS:  5 mL.   SPECIMEN:  None.   INDICATIONS FOR PROCEDURE:  The patient is a 40 year old female who has  completed chemotherapic for stage II breast cancer.  Dr. Maple Hudson placed a  left-sided Port-A-Cath in her.  This is going to be removed today.   DESCRIPTION OF PROCEDURE:  The patient was brought to the operating room  and placed supine.  After induction of MAC anesthesia, left subclavian  region was prepped in sterile fashion. A 0.25% Sensorcaine was used and  the old incision was opened.  I dissected down from the catheter and  grabbed it with a hemostat.  I then pulled the catheter Allis keeping my  finger of the tract and compressing it.  The entire came out in its  entirety. There was no signs of breakage at this point.  It measured  about 18 cm, it looked like.  This was passed off the field.  This was  then held with a hemostat.  I then put a figure-of-eight sutures through  the tract site with 3-0 Vicryl.  We then used a scalpel to excise the  actual reservoir part of the port and catheter off the field in its  entirety.  We then closed the wound in layers, the deep layer of 3-0  Vicryl and 4-0 Monocryl.  Dermabond was applied.  All final counts of  sponge, needle, and instruments were found  to be correct at this portion  of the case.  The patient was then taken to recovery in satisfactory  condition.      Thomas A. Cornett, M.D.  Electronically Signed     TAC/MEDQ  D:  12/22/2007  T:  12/22/2007  Job:  [id number]

## 2010-10-27 NOTE — Assessment & Plan Note (Signed)
[facility]                          CHRONIC HEART FAILURE NOTE   SHARLOT, STURKEY                      MRN:          [id number]  DATE:06/07/2007                            DOB:          1970/07/05    HISTORY:  Montine returns today for followup of her congestive heart  failure which is secondary to nonischemic cardiomyopathy with ongoing  class 3 New York Heart Association symptoms in a setting of previous  Adriamycin therapy.  I last saw Yarlin 2 weeks ago at which time she  complained of ongoing fatigue and new onset of falls with unsteady gait.  I immediately referred her to neurology for further evaluation.  She saw  Dr. Vickey Huger last week.  Jaeli reports that Dr. Vickey Huger is concerned  that her peripheral neuropathy is advancing.  She has an MRI of the head  ordered for this Friday.  Margarett has also been complaining of some heel  pain.  She reports that she was told that she had some fasciitis and Dr.  Vickey Huger recommended gel pads for her shoes and recommending some  physical therapy also.  Bhumi is also concerned, she states that she  has been told that she should have a hysterectomy to have her ovaries  removed to help with the hormonal response with her breast cancer.  Tariya is concerned about this.  She states she feels so weak that she  is hesitant to proceed with the surgery and is considering holding off  on it at this time.  She does not have any specific complaints.  She  just states she feels very tired and weak all the time.  She just does  not feel well.  She complains of poor memory, feeling like she is in a  fog and difficulty concentrating.  Appetite has been stable.  Fluid  volume status has been stable.   I checked lab works on her on 12th.  H and H were stable at 12.7 and  38.3.  Potassium 4.1, BUN and creatinine 10 and 0.6.  BNP was 247.  I  also had Dessie wear a 24 hour blood pressure monitor secondary to her  falls to  make sure there was no contributing orthostatic hypotension.  Blood pressures were stable.  She was asymptomatic during the 24 hour  monitoring.  Blood pressure did drop, the lowest point 84/42 at 10:00 at  night.  She does not recall feeling any different at that time.   PAST MEDICAL HISTORY:  1. Congestive heart failure secondary to nonischemic cardiomyopathy      with class 3 New York Heart Associations symptoms in a setting of      previous Adriamycin therapy.  2. Breast cancer status post mastectomy, multiple rounds of      chemotherapy including Adriamycin and now undergoing radiation      therapy.  3. Hypertension.  4. Gastroesophageal reflux disease.  5. Situational depression.  6. Status post recent hospitalization for acute on chronic      exacerbation of heart failure and pneumonia at which time she also  had a nonfunctioning Port-A-Cath that was treated with t-PA.  7. Sinus tachycardia.  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  9. Most recent echocardiogram done in November showed an ejection      fraction of 20-25%.  10.New onset of falls with unclear etiology.  The patient is being      followed by Dr. Vickey Huger.   REVIEW OF SYSTEMS:  As stated above.   CURRENT MEDICATIONS:  1. Include potassium 20.  2. Coumadin per Dr. Donnie Coffin.  3. Coreg 9.375 b.i.d.  4. Ativan 1 mg daily.  5. Tamoxifen daily.  6. Lexapro 20 mg daily.  7. Neurontin 300 mg daily.  8. Multivitamin daily.  9. Stool softener and Ambien p.r.n.  10.Lasix 20 mg daily.  11.Lisinopril 5 mg b.i.d.  12.Prilosec 20 mg b.i.d.   PHYSICAL EXAMINATION:  VITAL SIGNS:  Weight 203 pounds, blood pressure  108/60 and heart rate of 99.  Leniya was in no acute distress.  NECK:  No signs of jugular vein distention at 45 degree angle.  LUNGS:  She had some scattered rhonchi, otherwise clear to auscultation.  CARDIOVASCULAR:  Exam reveals an S1, S2, slightly tachycardic.  ABDOMEN:  Soft, nontender,  positive bowel sounds.  EXTREMITIES:  Lower extremities without clubbing, cyanosis or edema.  NEUROLOGICAL:  Alert and oriented x3 at this time.   IMPRESSION:  Congestive heart failure secondary to nonischemic  cardiomyopathy with ongoing class 3 symptoms still tachycardic.  We will  start digoxin 0.125 mg daily and I am going to try and titrate her  lisinopril.  I am going to have her take 5 mg in the morning and 10 mg  in the evening and I will see her back in 2 weeks for re-evaluation.  In  regards to her hysterectomy the patient is cleared to proceed with  laparoscopic procedure with request to use conscious sedation if  possible.  I do not feel the patient is a candidate for general  anesthesia at this time.      Dorian Pod, ACNP  Electronically Signed      Bevelyn Buckles. Bensimhon, MD  Electronically Signed   MB/MedQ  DD: 06/07/2007  DT: 06/07/2007  Job #: [id number]   cc:   Pierce Crane, MD

## 2010-10-27 NOTE — Discharge Summary (Signed)
[name], [name]               ACCOUNT NO.:  [id number]   MEDICAL RECORD NO.:  [id number]          PATIENT TYPE:  INP   LOCATION:  1421                         [facility]ACILITY[facility]  [facility]   PHYSICIAN:  Jesse Sans. Wall, MD, FACCDATE OF BIRTH:  26-May-1971   DATE OF ADMISSION:  05/04/2007  DATE OF DISCHARGE:  05/10/2007                               DISCHARGE SUMMARY   PROCEDURES:  CT angiogram of the chest.   PRIMARY FINAL DISCHARGE DIAGNOSIS:  Pneumonia.   SECONDARY DIAGNOSES:  1. Chronic systolic congestive heart failure.  2. History of nonischemic cardiomyopathy presumed secondary to      Adriamycin with an ejection fraction of 20-25% by echocardiogram      April 17, 2007.  3. History of port-a-cath treated with t-PA November 2008.  4. History of breast cancer status post mastectomy, chemotherapy, and      radiation therapy.  5. Hypertension.  6. Gastroesophageal reflux disease.  7. Allergy or intolerance to REGLAN.  8. Insomnia.  9. Depression.   TIME AT DISCHARGE:  45 minutes.   HOSPITAL COURSE:  [name] is a 40 year old female who was hospitalized  November 7 through April 22, 2007, for CHF exacerbation. She was seen  in the office on May 04, 2007, and was complaining of cough as well  as increased shortness of breath and tachycardia.  She was admitted for  further evaluation and treatment.   Her chest x-ray did not clearly show pneumonia, but there were some  areas that were not well seen.  After admission, she had a chest CT  which showed bilateral upper lobar pneumonia, particularly involving the  right upper lobe.  The CT angiogram was negative for PE.  A pulmonary  consult was called, and she was started on antibiotics.  A followup  chest x-ray on November 24 showed opacity of the right lung consistent  with pneumonia and left basilar haziness as well.  Her symptoms had  improved, however, and she was afebrile.  Blood cultures were performed  on admission  and  were negative.  Strep test was negative, and a sputum  culture showed normal oropharyngeal flora.  However, she responded to  the antibiotics, and it was felt that treatment was appropriate.   By May 10, 2007, she was breathing better and coughing less.  Dr.  Daleen Squibb considers her stable for discharge with transition of antibiotics  from IV to p.o. per pulmonary critical care medicine service, and  outpatient followup arranged.   DISCHARGE INSTRUCTIONS:  1. Her activity level is to be increased gradually.  2. She is to weigh herself daily.  3. She is to stick to a low-sodium diet.  4. She is to follow up with Dr. Antoine Poche on May 19, 2007, at 9      a.m.  5. She is to follow up with Dr. Donnie Coffin and with her [facility] at      [facility] as needed.   DISCHARGE MEDICATIONS:  1. Coreg 3.125 mg 3 tablets b.i.d.  2. Lexapro 20 mg a day.  3. Lasix 20 mg a day.  4.  Neurontin 300 mg nightly.  5. Hydrocodone as needed.  6. Lisinopril 5 mg b.i.d.  7. Vitamins as prior to admission.  8. Omeprazole 20 mg b.i.d.  9. Potassium 20 mEq a day.  10.Tamoxifen 20 mg a day.  11.Coumadin 1 mg daily.  12.Ativan as prior to admission.  13.Ambien as prior to admission.  14.Robitussin DM as needed.  15.Antibiotics at this time include IV azithromycin and Zosyn, but      these will be changed to p.o. prior to discharge by pulmonary.      Theodore Demark, PA-C      Jesse Sans. Daleen Squibb, MD, [facility]  Electronically Signed    RB/MEDQ  D:  05/10/2007  T:  05/10/2007  Job:  [id number]   cc:   Pierce Crane, MD  Fax: [phone]   [facility] at [facility]

## 2010-10-27 NOTE — Assessment & Plan Note (Signed)
[facility]                            CARDIOLOGY OFFICE NOTE   [name], [name]                      MRN:          [id number]  DATE:05/13/2008                            DOB:          1970/08/26    ONCOLOGIST:  Pierce Crane, MD   INTERVAL HISTORY:  [name] is a delightful 40 year old woman with a  history of nonischemic cardiomyopathy likely secondary to chemotherapy.  She also has history of breast cancer status post right mastectomy and  chemotherapy.  Fortunately, she has had a marked improvement in her LV  function with therapy.  Ejection fraction most recently is 50-55%;  however, ventricle is still a little bit dilated at 56 mm.  It  previously was 47 mm prior to her chemotherapy.   She returns today for routine followup.  She is doing fairly well.  For  several weeks, she felt short of breath and dizzy which she now  attributes to a virus.  Over the last week, she has felt much better,  but she has not restarted her walking program yet.  She does get  occasional lower extremity edema which she manages well with Lasix.  No  orthopnea.  No PND.  She notes that she gets dizzy after taking her  medications, but has not had any syncope.   CURRENT MEDICATIONS:  1. Prilosec 20 b.i.d.  2. Lasix 20 a day.  3. Potassium 20 a day  4. Carvedilol 25 b.i.d.  5. Gabapentin 300 b.i.d.  6. Lisinopril 10 b.i.d.  7. Tamoxifen 20 a day.  8. Digoxin 125 mcg a day.  9. Bupropion 150 a day.  10.Flaxseed oil.  11.Multivitamin.  12.Vitamin D.  13.Celexa.   PHYSICAL EXAMINATION:  GENERAL:  She is well-appearing in no acute  distress, ambulates around in the clinic without any respiratory  difficulty.  VITAL SIGNS:  Blood pressure is 128/84, heart rate is 72, weight is 211.  HEENT:  Normal.  NECK:  Supple.  No obvious JVD.  Carotids are 2+ bilaterally without any  bruits.  There is no lymphadenopathy or thyromegaly.  CARDIAC:  PMI is slightly  laterally displaced.  Regular rate and rhythm.  No murmurs, rubs, or gallops.  No S3.  LUNGS:  Clear.  ABDOMEN:  Obese, nontender, nondistended.  No hepatosplenomegaly.  No  bruits.  No masses.  Good bowel sounds.  EXTREMITIES:  Warm with no cyanosis, clubbing, or edema.  No rash.  NEUROLOGIC:  Alert and oriented x3.  Cranial nerves II through XII are  intact.  Moves all 4 extremities without difficulty.  PSYCHIATRY:  Affect is pleasant.   EKG shows normal sinus rhythm at a rate of 72.  No ST-T wave  abnormalities.   ASSESSMENT AND PLAN:  Congestive heart failure secondary to nonischemic  cardiomyopathy.  Her ejection fraction has just about normalized.  She  is New York Heart Association class II to III.  Volume status looks  good.  Although she has had a significant recovery in her ejection  fraction, she still has some left ventricular dilatation and we are  hoping for some  more reverse remodeling.  We will continue her current  medications.  I was hoping to actually increase her lisinopril today,  but given her dizziness we will hold off on this.  I have asked her to  keep a very close blood pressure log both before and after taking her  medications to see if she is actually developing hypotension as a cause  for her dizziness.  If so, I would consider dropping her Coreg back to  12.5 b.i.d., but would want objective proof before doing this.  We can  go ahead and stop her digoxin.   DISPOSITION:  She will follow up with the [facility] in 1  month.  She will come back to see me in 3 months.     Bevelyn Buckles. Bensimhon, MD  Electronically Signed    DRB/MedQ  DD: 05/13/2008  DT: 05/14/2008  Job #: [id number]

## 2010-10-27 NOTE — Assessment & Plan Note (Signed)
[facility]                          CHRONIC HEART FAILURE NOTE   [name], [name]                      MRN:          [id number]  DATE:05/26/2007                            DOB:          Dec 12, 1970    ONCOLOGIST:  Pierce Crane, M.D.   PRIMARY CARDIOLOGIST:  Bevelyn Buckles. Bensimhon, M.D.   PRIMARY CARE PHYSICIAN:  NO FURTHER DICTATION      Dorian Pod, ACNP       Bevelyn Buckles. Bensimhon, MD    MB/MedQ  DD: 05/26/2007  DT: 05/27/2007  Job #: [id number]

## 2010-10-30 NOTE — Op Note (Signed)
[name], [name]               ACCOUNT NO.:  [id number]   MEDICAL RECORD NO.:  [id number]          PATIENT TYPE:  AMB   LOCATION:  DSC                          FACILITY:  [facility]   PHYSICIAN:  Rose Phi. Maple Hudson, M.D.   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   OPERATION:  1. Completion of right axillary lymph node dissection.  2. Insertion of Port-A-Cath under fluoroscopic control.   SURGEON:  Dr. Francina Ames.   ANESTHESIA:  General.   OPERATIVE PROCEDURE:  After suitable general anesthesia was induced, the  patient was placed in the supine position with the arms extended on the  arm board.  The right axilla was then prepped and draped in the usual  fashion.  The old sentinel node incision was incised and extended  anteriorly and posteriorly.  We then dissected down to expose the  lateral margin of the pectoralis major muscle.  I incised in a cephalad  direction, retracting the pectoralis major and then identified the  axillary vein and incised the clavipectoral fascia over it.  We incised  along the pectoralis minor and retracted that and then dissected all of  the tissue inferior to the vein and from behind the pectoralis minor  muscle, taking out the level I and level II axillary lymph nodes.  The  long thoracic and thoracodorsal nerves were identified and preserved.  Other cutaneous nerves and vessels were clipped and divided.  Following  the removal of the axillary content, we had good hemostasis.  We  thoroughly irrigated it with saline.  A 19-French Blake drain was  inserted and brought out through a separate stab wound.  We closed the  skin with staples.  A dressing was applied.   Following the completion of the axillary lymph node dissection, we  then  repositioned the patient on the table with arms down by the side and the  face turned to the right.  The left upper chest was then prepped and  draped in the usual fashion.  A left subclavian puncture was carried out  without difficulty and the guidewire inserted and proper positioning of  the guidewire confirmed by C-arm fluoroscopy.   I then made an incision on the anterior chest wall and developed a  pocket for the implantable port.  We tunneled between the puncture site  and the port area and passed the catheter through that and then  connected it to the port and placed the port in the pocket; then laid  the catheter on the chest wall and measured out to go to the fourth  interspace at the level of the nipple.  We divided the catheter there.   We then passed the dilator and peel-away sheath over the wire and  removed the wire and the dilator and passed the catheter through the  peel-away sheath and then removed it.   Again  C-arm fluoroscopy confirmed that the catheter tip was in the  superior vena cava near the cavoatrial junction.   We then anchored the port in the pocket with two 2-0 Prolene sutures.  We then accessed it and fully heparinized it and it easily aspirated and  irrigated.   Incisions were closed with 3-0 Vicryl and subcuticular 4-0 Monocryl and  Dermabond.   All the final dressings were then placed and the patient transferred to  the recovery room in satisfactory condition, having tolerated the  procedure well.      Rose Phi. Maple Hudson, M.D.  Electronically Signed     PRY/MEDQ  D:  09/07/2006  T:  09/07/2006  Job:  [id number]

## 2010-10-30 NOTE — Op Note (Signed)
[facility]  Patient:    [name], [name]                        MRN: [id number] Proc. Date: 09/11/99 Adm. Date:  [id number] Disc. Date: [id number] Attending:  Otilio Connors Iv CC:         Ronnald Nian, M.D.             Luanna Cole. Lenord Fellers, M.D.                           Operative Report  PREOPERATIVE DIAGNOSIS:       Pelvic pain.  Irregular menstrual cycles.  POSTOPERATIVE DIAGNOSIS:      Pelvic pain.  Irregular menstrual cycles. Fibroid uterus.  Pelvic adhesions.  OPERATION:                    Diagnostic laparoscopy.  Laparoscopic lysis of adhesions.  Laparoscopic ablation of endometriosis.  Laparoscopic pelvic biopsies.  SURGEON:                      Janine Limbo, M.D.  ASSISTANT:  ANESTHESIA:                   General anesthesia.  ESTIMATED BLOOD LOSS:  INDICATIONS:                  [name] is a 40 year old who presents with increasing pelvic pain and dysmenorrhea.  She understands the indications for her surgical procedure and she accepts the risks of, but not limited to, anesthetic  complications, bleeding, infections, and possible damage to the surrounding organs.  FINDINGS:                     The uterus was normal size overall and there was 2 cm fibroid present on the posterior uterus. The fallopian tubes and the ovaries  appeared normal.  The fimbriated ends of the fallopian tubes were delicate. There were adhesions between the large bowel and the right pelvic side wall.  The appendix appeared normal.  The liver and upper abdomen and bowel otherwise appeared normal.  There were hyperpigmented lesions in the posterior cul-de-sac consistent with endometriosis.  There was a hyperpigmented lesion on the right broad ligament above the right round ligament consistent with endometriosis.  There was a peritoneal window on the left uterosacral ligament.  With chromopertubation, dye was noted to easily fill the fallopian  tubes and spill freely from the delicate  fimbriated ends of both tubes.  DESCRIPTION OF PROCEDURE:     The patient was taken to the operating room where a general anesthesia was given.  The patients abdomen, perineum, and vagina were prepped with multiple layers of Betadine.  Examination under anesthesia was performed.  A Foley catheter was placed in the bladder.  An Acorn cannula was placed inside the uterus.  The patient was sterilely draped.  The subumbilical rea was injected with 0.25% Marcaine.  An incision was made and the Veress needle was inserted into the abdominal cavity.  Proper placement was confirmed using the saline drop test.  A pneumoperitoneum was obtained.  The laparoscopic trocar was then substituted for the Veress needle.  The laparoscope was inserted and the pelvic cavity was inspected.  There was no evidence of damage to the bowel or the vascular structures.  Two suprapubic areas were injected with  0.25% Marcaine and two 5 mm trocars were placed into the abdominal cavity.  Pictures were taken of the patients pelvic anatomy.  The adhesions between the large bowel and the right pelvic side wall were then lysed using sharp dissection.  Care was taken not to  damage the bowel.  Hemostasis was adequate.  The areas of endometriosis mentioned above were then injected with saline.  Biopsies were obtained from these areas.  The biopsy from the left posterior cul-de-sac did have a slight amount of bleeding and hemostasis was achieved using the bipolar cautery.  Care was taken not to damage the underlying structures or the bowel.  The areas of endometriosis mentioned above were then ablated using the bipolar cautery after the peritoneum had been injected with saline.  Dye was then injected through the Acorn cannula and the fallopian tubes were noted to fill quickly and dye was noted to spill bilaterally.  The pelvic cavity was then irrigated.  Hemostasis was noted  to be  adequate throughout.  The fluid was then aspirated from the pelvis.  The pneumoperitoneum was allowed to escape.  Again hemostasis was confirmed after the abdomen had been deflated.  All instruments were removed.  The incisions were closed using deep and superficial sutures of 4-0 Vicryl.  Sponge, needle, and instrument counts were correct x 2 occasions.  The estimated blood loss was 10 c. The patient tolerated her procedure well.  She was awakened from her anesthetic and taken to the recovery room in stable condition.  She was noted to drain clear yellow urine at the end of our procedure.  The Foley catheter and the Acorn cannula were removed.  FOLLOW-UP:                    The patient has a prescription for Vicodin and she will taken one to two tablets every four hours as needed for pain.  She will use Ibuprofen 600 mg every six hours as needed for pain.  She was given a copy of the postoperative instruction sheet as prepared by [facility] for patients who have undergone laparoscopy.  She will return to the office in two o three weeks for follow-up instructions.  The patient will call for questions or  concerns. DD:  09/11/99 TD:  09/11/99 Job: [id number]

## 2010-10-30 NOTE — Discharge Summary (Signed)
[name], [name] NO.:  [id number]   MEDICAL RECORD NO.:  [id number]          PATIENT TYPE:  OIB   LOCATION:  9309                          FACILITY:  [facility]   PHYSICIAN:  Janine Limbo, M.D.DATE OF BIRTH:  08/05/1970   DATE OF ADMISSION:  04/13/2006  DATE OF DISCHARGE:  04/14/2006                                 DISCHARGE SUMMARY   DISCHARGE DIAGNOSES:  1. Pelvic pain.  2. Dysmenorrhea.  3. Endometriosis.  4. Fibroid uterus.  5. Anemia.   OPERATIONS:  On the day of admission, the patient underwent a total vaginal  hysterectomy with bilateral salpingectomy, tolerating procedures well.  The  patient was found to have a uterus which was upper limits of normal size,  weighing approximately 159 grams.  There was a fibroid present that measured  approximately 2 cm.  There were no adhesions appreciated.  The fallopian  tubes and ovaries appeared normal.  There was no evidence of endometriosis  at this time.   HISTORY OF PRESENT ILLNESS:  [name] is a 40 year old female para 1-0-0-1  who presented for vaginal hysterectomy because of symptomatic uterine  fibroids, endometriosis, pelvic pain and anemia.  Please see the patient's  dictated history and physical examination for details.   PREOPERATIVE PHYSICAL EXAM:  Weight is 191 pounds.  General exam was within  normal limits.  Pelvic exam:  External genitalia was normal.  Vagina was  normal.  Cervix was nontender.  Uterus was upper limits of normal size,  shape and consistency.  Adnexa was without masses.  Rectovaginal exam  confirms.   HOSPITAL COURSE:  On the date of admission, the patient underwent  aforementioned procedures tolerating them all well.  The patient's  postoperative course was unremarkable with her resuming bowel and bladder  function by postop day #1 and therefore deemed ready for discharge home.  Postop hemoglobin was 11.5 (preop hemoglobin 11.9).   DISCHARGE MEDICATIONS:  Fioricet 1-2  tablets every 4 hours as needed for  severe pain, Phenergan 25 mg 1 tablet every 6 hours as needed for nausea,  iron 325 mg 1 tablet twice daily for 6 weeks.  The patient was advised to  continue her home medications.   FOLLOW UP:  The patient is to call [facility] at [phone] to  schedule a 6-week postoperative visit with Dr. Stefano Gaul.   DISCHARGE INSTRUCTIONS:  The patient was given a copy of [facility] postoperative instruction sheet.  She was further advised to avoid  driving for 1-2 weeks, heavy lifting for 4 weeks (10 to 20 pounds).  No  intercourse for 6 weeks.  She may shower, walk up steps, and is to increase  her activity slowly.  The patient's diet was without restriction.   FINAL PATHOLOGY:  Uterus hysterectomy:  Benign cervix with no significant  abnormalities; benign secretory endometrium, no hyperplasia identified.  Leiomyoma.  Bilateral fallopian tubes resection:  Benign unremarkable  fallopian tubes.      [name].      Janine Limbo, M.D.  Electronically Signed    EJP/MEDQ  D:  04/29/2006  T:  04/29/2006  Job:  [id number]

## 2010-10-30 NOTE — H&P (Signed)
[facility]  Patient:    [name], [name] Visit Number: [id number] MRN: [id number]          Service Type: OBS Location: 910B 9152 01 Attending Physician:  Leonard Schwartz Dictated by:   Mack Guise, C.N.M. Admit Date:  05/31/2001                           History and Physical  HISTORY OF PRESENT ILLNESS:   [name] is 40 year old gravida 1 para 0 at 65 and three-sevenths weeks, EDD May 21, 2001 by dates confirmed with pregnancy ultrasonography.  She is admitted for induction of labor secondary to decreased fetal movement and nonreactive nonstress test in the office of [facility] today.  She has no bleeding, no rupture of membranes.  Denies any headache, visual changes, or epigastric pain.  Her pregnancy has been followed by the M.D. service at [facility] and is remarkable for: 1. Irregular cycles. 2. Mitral valve prolapse. 3. History ulcer. 4. Group B strep negative.  This patient was initially evaluated at the office of [facility] on October 11, 2000 at approximately six weeks gestation.  EDD confirmed by dates and confirmed with pregnancy ultrasonography.  Patients pregnancy has remained essentially unremarkable.  She has had some preterm labor symptoms with no cervical change.  She has had some exacerbation of her ulcer which has been controlled with Zantac.  She had an elevated one-hour Glucola followed by a three-hour GTT with only one abnormal limit.  She has been normotensive with no proteinuria, size equal to dates throughout.  PRENATAL LABORATORY DATA:     On Nov 01, 2000, hemoglobin and hematocrit 11.6 and 35; platelets 241,000.  Blood type and Rh B positive, antibody screen negative.  Sickle cell trait negative.  VDRL nonreactive.  Rubella immune. Hepatitis B surface antigen negative.  HIV nonreactive.  Pap smear within normal limits.  GC and chlamydia negative.  On November 29, 2000 AFP/free beta HCG within normal limits.  On March 14, 2001 one-hour glucose challenge 148 followed by three-hour GTT which had only one abnormal value.  She has been followed as normal since that time.  At 36 weeks culture of the vaginal tract is negative for group B strep.  MEDICAL HISTORY:              History of abnormal Pap smear; repeats have all been normal.  In 2001 patient had a laparoscopy for endometriosis.  Patient had a history of infertility for greater than one year, had testing but no treatment, and became pregnant with no assistive reproductive technology. History of mitral valve prolapse.  Patient was diagnosed with an ulcer in 2002 and uses Mylanta.  Tonsillectomy age 72.  FAMILY HISTORY:               Patients mother with a history of chronic hypertension.  Patients mother and sisters are tobacco smokers.  GENETIC HISTORY:              Patient and her sister have a history of a heart murmur.  Otherwise there is no family history of familial or genetic diseases, children that died in infancy or were born with birth defects.  ALLERGIES:                    No known drug allergies.  SOCIAL HISTORY:               Patient denies the  use of tobacco, alcohol, or illicit drugs.  Patient is a 40 year old married African-American female.  Her husband, Montine Hight, is involved and supportive.  They are of the [employer] faith.  REVIEW OF SYSTEMS:            There are no signs or symptoms suggestive of focal or systemic disease and the patient is typical of one with a uterine pregnancy at term.  PHYSICAL EXAMINATION:  VITAL SIGNS:                  Stable, afebrile.  HEENT:                        Unremarkable.  HEART:                        Regular rate and rhythm.  LUNGS:                        Clear.  ABDOMEN:                      Gravid in its contour.  Uterine fundus is noted to extend 40 cm above the level of the pubic symphysis.  Leopolds maneuvers find the infant to be in a longitudinal lie, cephalic presentation, and  the estimated fetal weight is 8 pounds.  PELVIC:                       Digital exam of the cervix finds it to be 2 cm dilated.  The fetal heart rate since she has been admitted to [facility] has been reactive and reassuring.  She is having no contractions.  EXTREMITIES:                  Show no pathologic edema.  DTRs are 1+ with no clonus.  ASSESSMENT:                   1. Intrauterine pregnancy at 41 and                                  three-sevenths weeks.                               2. Induction of labor for post dates and                                  nonreassuring fetal heart rate in the office                                  of [facility].  PLAN:                         1. Admit per Dr. Marline Backbone.                               2. Routine M.D. orders.  3. Cervical ripening with the use of Cytotec                                  with induction of labor to follow. Dictated by:   Mack Guise, C.N.M. Attending Physician:  Leonard Schwartz DD:  05/31/01 TD:  05/31/01 Job: [id number]

## 2010-10-30 NOTE — H&P (Signed)
NAMEIAN, CAVEY NO.:  [id number]   MEDICAL RECORD NO.:  [id number]          PATIENT TYPE:  AMB   LOCATION:  SDC                           [facility]ILITY:  [facility]   PHYSICIAN:  Janine Limbo, M.D.DATE OF BIRTH:  1971-05-21   DATE OF ADMISSION:  DATE OF DISCHARGE:                                HISTORY & PHYSICAL   HISTORY OF PRESENT ILLNESS:  Ms. Delancey is a 40 year old female, para 1-0-0-  1, who presents for a laparoscopic tubal cautery.  The patient has been  followed at the [facility] and Gynecology division of  [facility].  The patient had a cesarean delivery in 2002  because of a nonreassuring fetal heart rate tracing.  She complains of  pelvic pain.  She has been diagnosed with endometriosis.   ALLERGIES:  No known drug allergies.   PAST MEDICAL HISTORY:  1.  The patient has a history of ulcers.  The patient is currently taking      Protonix and she uses Colon Cleanse on a regular basis.  2.  She also has a history of mild aortic stenosis, and she was told that      she does need prophylactic antibiotics.   OBSTETRICAL HISTORY:  Please see history of present illness.   SOCIAL HISTORY:  The patient drinks alcohol socially.  She denies cigarette  use and other recreational drug uses.   PAST SURGICAL HISTORY:  1.  The patient had a tonsillectomy at age 1.  2.  Diagnostic laparoscopy in 2001.  3.  Cesarean delivery in 2002.   REVIEW OF SYSTEMS:  Noncontributory.   FAMILY HISTORY:  The patient's mother has hypertension.   PHYSICAL EXAMINATION:  VITAL SIGNS:  Weight is 191 pounds.  HEENT:  Within normal limits.  CHEST:  Clear.  HEART:  Regular rate and rhythm.  BREASTS:  Without masses.  ABDOMEN:  Nontender.  EXTREMITIES:  Grossly normal.  NEUROLOGIC:  Exam is grossly normal.  PELVIC:  External genitalia is normal.  Vagina is normal.  Cervix is  nontender.  Uterus is normal size, shape, and consistency.   Adnexa:  No  masses.  Rectovaginal exam confirms.   ASSESSMENT:  Desires sterilization.  Pelvic pain.   PLAN:  The patient will undergo a laparoscopic tubal cautery.  She  understands the indications for her surgical procedure and she accepts the  risk of, but not limited to, anesthetic complications, bleeding, infections,  and possible damage to the surrounding organs.      Janine Limbo, M.D.  Electronically Signed     AVS/MEDQ  D:  08/15/2005  T:  08/15/2005  Job:  [id number]

## 2010-10-30 NOTE — Discharge Summary (Signed)
[facility]  Patient:    [name], [name] Visit Number: [id number] MRN: [id number]          Service Type: OBS Location: 9300 [id number] 01 Attending Physician:  Leonard Schwartz Dictated by:   Mack Guise, C.N.M. Admit Date:  05/31/2001 Discharge Date: 06/04/2001                             Discharge Summary  ADMITTING DIAGNOSES:          1. Intrauterine pregnancy at 41-1/2 weeks.                               2. Induction of labor.                               3. Nonreassuring fetal heart rate tracing.  PROCEDURE:                    Primary low transverse cesarean delivery.  DISCHARGE DIAGNOSES:          1. Intrauterine pregnancy at 41-1/2 weeks.                               2. Induction of labor.                               3. Nonreassuring fetal heart rate tracing.                               4. True knot in cord.  HOSPITAL COURSE:              [name] is a 40 year old, gravida 1, para 0, who presented at 41-1/2 weeks for induction of labor secondary to postdates and nonreassuring fetal heart rate tracing in the office. Cervical ripening was initiated and the patient became 3 to 4 cm but fetal heart rate became nonreassuring necessitating a primary low transverse cesarean delivery by Dr. Leonard Schwartz with the birth of an 8-pound 2-ounce female infant named Maya, Apgar scores of 8 at one minute and 9 at five minutes. The patient has done well in the immediate postoperative period.  On the first postoperative day her hemoglobin was 10.5. She has continued to do well and on this, her third postoperative day, she is deemed to be in satisfactory condition for discharge.  DISCHARGE INSTRUCTIONS:       Instructions are per [facility] handout.  DISCHARGE MEDICATIONS:        1. Motrin 600 mg p.o. q.6h. p.r.n. pain.                               2. Darvocet one p.o. q.4-6h. pain.                               3.  Micronor.                               4. Prenatal vitamins.  DISCHARGE  FOLLOWUP:           The patient will follow up in the office of [facility] in six weeks.  Dictated by:   Mack Guise, C.N.M. Attending Physician:  Leonard Schwartz DD:  06/04/01 TD:  06/04/01 Job: [id number]

## 2010-10-30 NOTE — H&P (Signed)
NAMELAMA, NARAYANAN NO.:  [id number]   MEDICAL RECORD NO.:  [id number]          PATIENT TYPE:  AMB   LOCATION:  SDC                           [facility]   PHYSICIAN:  Janine Limbo, M.D.DATE OF BIRTH:  1971/03/17   DATE OF ADMISSION:  02/07/2006  DATE OF DISCHARGE:                                HISTORY & PHYSICAL   HISTORY OF PRESENT ILLNESS:  [name] is a 40 year old female, para 1-0-0-  1, who presents for a vaginal hysterectomy.  The patient has been followed  at the [facility] and Gynecology Division of [facility].  The patient complains of increasing dysmenorrhea,  fibroids, and endometriosis.  The patient had a diagnostic laparoscopy with  tubal ligation in March of 2007.  She has had continued discomfort in spite  of conservative therapies.  She wishes to proceed with definitive treatment  at this time.  The patient's most recent Pap smear was within normal limits.  Her endometrial biopsy showed benign elements.  The patient had a cesarean  section in 2002 because of a nonreassuring fetal heart rate tracing.   ALLERGIES:  NO KNOWN DRUG ALLERGIES.   CURRENT MEDICATIONS:  Protonix as needed.  She uses colon cleanse on a  regular basis.   PAST MEDICAL HISTORY:  1. History of ulcers.  2. Questionable history of mitral valve prolapse.  She was told that she      does not need prophylactic antibiotics.  3. The patient had a tonsillectomy at age 67.  80. She had a diagnostic laparoscopy in 2001.   OBSTETRICAL HISTORY:  The patient had a cesarean section in 2002.   SOCIAL HISTORY:  The patient drinks alcohol socially.  She denies cigarette  use and other recreational drug uses.   REVIEW OF SYSTEMS:  Noncontributory.   FAMILY HISTORY:  The patient's mother has hypertension.   PHYSICAL EXAMINATION:  VITAL SIGNS:  Weight is 191 pounds.  HEENT:  Within normal limits.  CHEST:  Clear.  CARDIOVASCULAR:   Regular rate and rhythm.  BREASTS:  Without masses.  ABDOMEN:  Nontender.  EXTREMITIES:  Grossly normal.  NEUROLOGIC:  Grossly normal.  PELVIC:  External genitalia is normal.  Vagina is normal.  Cervix is  nontender.  Uterus is 8-10 weeks size and irregular.  Adnexa no masses.  Rectovaginal exam confirms.   ASSESSMENT:  1. Fibroid uterus.  2. Dysmenorrhea.  3. Endometriosis.   PLAN:  The patient will undergo a vaginal hysterectomy.  She understands the  indications for her surgical procedure as well as the alternative treatment  options.  She accepts the risks of, but not limited to, anesthetic  complications, bleeding, infections, and possible damage to the surrounding  organs.   SURGERY CANCELLED BECAUSE OF BRONCHITIS !      Janine Limbo, M.D.  Electronically Signed     AVS/MEDQ  D:  02/04/2006  T:  02/04/2006  Job:  [id number]

## 2010-10-30 NOTE — Assessment & Plan Note (Signed)
[facility]                                 ON-CALL NOTE   LORANDA, MASTEL                      MRN:          [id number]  DATE:01/31/2009                            DOB:          [birth year]    DESCRIPTION OF CALL:  Malani is a 40 year old woman who I follow in the  Heart Failure Clinic for history of heart failure, secondary to  nonischemic cardiomyopathy.  Her EF was previously 30-35%.  This was  thought to be related to chemotherapy, she received from her breast  cancer.  Most recently, her ejection fraction has improved and she has  had an ejection fraction in the 50-55% range.   She called tonight stating that over the past day or two, she has had  some increasing fluid overload, feels like she is up 7 pounds.  She does  describe some orthopnea.  She is taking several extra doses of her Lasix  20 mg a day and this has not really helped.   In talking with her in the phone, she didn't seem to be in any immediate  distress.  We decided to give her a trial of oral metolazone 2.5 mg  p.r.n.  I have called that into the [facility] on [address], tonight she will  pick it up and give it a try.  If she still having problems, she will  call me back and we can evaluate her in the emergency room.     Bevelyn Buckles. Bensimhon, MD  Electronically Signed    DRB/MedQ  DD: 01/31/2009  DT: 02/01/2009  Job #: [id number]

## 2010-10-30 NOTE — Op Note (Signed)
NAMEMAYLENE, [name]               ACCOUNT NO.:  [id number]   MEDICAL RECORD NO.:  [id number]          PATIENT TYPE:  AMB   LOCATION:  DSC                          FACILITY:  [facility]   PHYSICIAN:  Rose Phi. Maple Hudson, M.D.   DATE OF BIRTH:  Jul 23, 1970   DATE OF PROCEDURE:  08/22/2006  DATE OF DISCHARGE:                               OPERATIVE REPORT   PREOPERATIVE DIAGNOSIS:  Stage I carcinoma of the right breast.   POSTOPERATIVE DIAGNOSIS:  Stage I carcinoma of the right breast.   OPERATION:  1. Blue-dye injection.  2. Right partial mastectomy.  3. Right sentinel lymph-node biopsy.   SURGEON:  Dr. Francina Ames.   ANESTHESIA:  General.   OPERATIVE PROCEDURE:  Prior to coming to the operating room, 1 mCi of  technetium sulfur colloid was injected intradermally in the right  breast.   After suitable general anesthesia was induced, the patient was placed in  supine position with the arms extended on the arm board.  Five mL of a  mixture of 2 mL of methylene blue and 3 mL of injectable saline was  injected in the subareolar tissue and the breast gently massaged for 3  minutes.  We then prepped and draped the breast and axilla.   After scanning the axilla with the NeoProbe, a short transverse right  axillary incision was made with dissection through subcutaneous tissue  through the clavipectoral fascia.  Three separate blue and hot lymph  nodes were identified as sentinel nodes.  They were submitted to the  pathologist.  There were no other palpable blue or hot nodes.   While that was being looked at, the palpable tumor at the 9 o'clock  position of the right breast was outlined.  A transverse incision was  then made with dissection through subcutaneous tissue, and then we did a  wide excision of the palpable tumor.  The specimen was then oriented for  the pathologist and submitted for evaluation for margins.   Both incisions were rendered hemostatic with the cautery.  Then, 0.25%  Marcaine was injected.  Both incisions were closed in 2 layers with 3-0  Vicryl and subcuticular 4-0 Monocryl.   The sentinel node and the margins were reported as negative.  Dermabond  was then applied to the incisions, and a light dressing was applied  after it had dried.  She was then transferred to the recovery room in  satisfactory condition having tolerated the procedure well.      Rose Phi. Maple Hudson, M.D.  Electronically Signed     PRY/MEDQ  D:  08/22/2006  T:  08/23/2006  Job:  [id number]

## 2010-10-30 NOTE — Op Note (Signed)
NAMEBHAVIKA, Roy NO.:  [id number]   MEDICAL RECORD NO.:  [id number]          PATIENT TYPE:  AMB   LOCATION:  SDC                           FACILITY:  [facility]   PHYSICIAN:  Janine Limbo, M.D.DATE OF BIRTH:  Dec 31, 1970   DATE OF PROCEDURE:  08/20/2005  DATE OF DISCHARGE:                                 OPERATIVE REPORT   PREOPERATIVE DIAGNOSIS:  1.  Desires sterilization.  2.  Pelvic pain.  3.  History of endometriosis.   POSTOPERATIVE DIAGNOSIS:  1.  Desires sterilization.  2.  Pelvic pain.  3.  History of endometriosis.  4.  Pelvic adhesions.   PROCEDURE:  1.  Laparoscopic tubal cautery.  2.  Laparoscopic pelvic biopsies.  3.  Laparoscopic lysis of adhesions.   SURGEON:  Dr. Leonard Schwartz.   ANESTHETIC:  Is general.   DISPOSITION:  [name] and 40 year old with the above-mentioned diagnosis.  She is also had a prior cesarean section. She understands the indications  for surgical procedure and she accepts the risk of, but not limited to,  anesthetic complications, bleeding, infection, possible damage to  surrounding organs, and possible tubal failure (17 per 1000).   FINDINGS:  The uterus was upper limits normal size and otherwise normal. The  fallopian tubes and ovaries appeared normal. There was a dense adhesion  between the omentum and the anterior abdominal wall. There were also  adhesions between the bowel and the right pelvic sidewall at the level of  the pelvic brim. There were adhesions between the bowel and the left pelvic  sidewall. There was a hyperpigmented lesion that was slightly tan in color  measuring approximately 0.5 cm in size in the right posterior cul-de-sac.  There were three smaller lesions that appeared similarly also in the right  posterior cul-de-sac. There was a reddened hyperpigmented lesion present on  the left posterior cul-de-sac. There was a question of endometriosis in all  these areas. All these  areas were biopsied away. The appendix appeared  normal. The anterior cul-de-sac appeared normal. The liver and the  gallbladder appeared normal.   PROCEDURE:  The patient was taken to the operating room where a general  anesthetic was given. The patient's abdomen, perineum, and vagina were  prepped with multiple layers of Betadine. The bladder was drained of urine.  A Hulka tenaculum was placed inside the uterus. The patient was then  sterilely draped. The subumbilical area was injected with 5 mL of half  percent Marcaine with epinephrine. An incision was made and the Veress  needle was placed without difficulty. Proper placement was confirmed using  the saline drop test. A pneumoperitoneum was then obtained. The laparoscopic  trocar and laparoscope were substituted for the Veress needle. The abdominal  contents in the pelvis were carefully inspected. There was no evidence of  damage to the bowel or other pelvic structures. The dense adhesion between  the omentum and the anterior abdominal wall was then cauterized. It was cut  using laparoscopic scissors. We then lysed the adhesions between the bowel  and the left pelvic sidewall. We were  able to thoroughly visualize the  pelvis at this point. Pictures were taken. The lesions noted in the findings  portion of this note were then biopsied using the laparoscopic biopsy  instrument. Care was taken not to damage any of the vital underlying  structures. We then used the bipolar cautery to cauterize the right  fallopian tube. It was followed to its fimbriated end to document that it  was indeed the tube. The right tube was cauterized in several segments of  the midportion. An identical procedure was carried out on the opposite side.  Hemostasis was noted to be adequate throughout. The pelvis was irrigated.  The fluid was then aspirated away. Again hemostasis was confirmed. Again the  bowel was carefully inspected. There was no evidence of  damage to the bowel  or other vital structures. We felt at this point we were ready to terminate  our procedure. The patient was given Toradol 30 milligrams IV and 30  milligrams IM. The pneumoperitoneum was allowed to escape. The instruments  were removed. The subumbilical fascia was closed using interrupted suture of  0 Vicryl. The skin was reapproximated using 4-0 Vicryl. The suprapubic  incision was closed using 4-0 Vicryl. The patient was awakened from her  anesthetic and taken to the recovery room in stable condition. She tolerated  her procedure well. The estimated blood loss was 5 mL.   FOLLOW-UP INSTRUCTIONS:  The patient was given a prescription for Fiorinal  and also for Phenergan. She can also take ibuprofen 600 milligrams every 6  hours as needed. She will return to see Dr. Stefano Gaul in 2 weeks for follow-  up examination. She was given a copy of the postoperative instruction sheet  as prepared by the [facility] for patients who have  undergone laparoscopy.      Janine Limbo, M.D.  Electronically Signed     AVS/MEDQ  D:  08/20/2005  T:  08/20/2005  Job:  [id number]

## 2010-10-30 NOTE — Op Note (Signed)
[facility]  Patient:    [name], [name] Visit Number: [id number] MRN: [id number]          Service Type: OBS Location: 9300 [id number] Attending Physician:  Leonard Schwartz Dictated by:   Janine Limbo, M.D. Proc. Date: 06/01/01 Admit Date:  05/31/2001                             Operative Report  PREOPERATIVE DIAGNOSES:       1. A [redacted] week gestation.                               2. Nonreassuring fetal heart rate tracing.  POSTOPERATIVE DIAGNOSES:      1. A [redacted] week gestation.                               2. Nonreassuring fetal heart rate tracing.                               3. True knot in the cord.  PROCEDURE:                    Primary low transverse cesarean section.  SURGEON:                      Janine Limbo, M.D.  FIRST ASSISTANT:              Mack Guise, C.N.M.  ANESTHESIA:                   Spinal.  DISPOSITION:                  Ms. Salah is a 40 year old female gravida 1, para 0 who presents to labor and delivery at 73 1/[redacted] weeks gestation [facility] May 21, 2001) for induction because of a nonreactive nonstress test in the office.  The patient was given Cytotec.  She did begin to labor.  Her membranes ruptured spontaneously and the fluid was noted to be clear.  The patient began having late decelerations.  She was positioned on her side, given oxygen, and an amnioinfusion was started.  The fetal heart rate tracing did improve momentarily but then she had a prolonged late variable deceleration.  After two to three minutes with the fetal heart rate being less than 100 beats per minute, the decision was made to proceed with an emergency cesarean delivery.  The risks of cesarean delivery were reviewed including, but not limited to, anesthetic complications, bleeding, infections, and possible damage to the surrounding organs.  FINDINGS:                     An 8 pound 2 ounce female infant Sterling Big) was delivered  from a cephalic presentation.  The Apgars were 8 at one minute and 9 at five minutes.  The uterus, fallopian tubes, and ovaries were normal for the gravid state.  The umbilical cord had a knot in it.  The placenta was otherwise normal.  PROCEDURE:                    The patient was taken immediately to the operating room suite.  Once we were in the operating  room suite we were able to document that the fetal heart rate had returned to a normal range.  There were some decelerations noted even in the operating room, however.  We felt that it was appropriate to obtain spinal anesthesia.  This was done without difficulty.  The patients abdomen and perineum were then prepped with multiple layers of Betadine.  A Foley catheter was placed in the bladder.  The patient was sterilely draped.  A low transverse incision was made in the abdomen and carried sharply through the subcutaneous tissue, the fascia, and the anterior peritoneum.  An incision was made in the lower uterine segment and extended transversely.  The fetal head and then the body were delivered. The infant was noted to have a knot in the umbilical cord.  The cord was clamped and cut and the infant was handed to the awaiting pediatric team. Routine cord blood studies were obtained.  The placenta was removed.  The uterine cavity was cleaned of amniotic fluid, clotted blood, and membranes. The uterine incision was closed using a running locking suture of 2-0 Vicryl. Hemostasis was noted to be adequate.  The pelvis was vigorously irrigated. The pericolonic gutters were cleaned of amniotic fluid and clotted blood.  The anterior peritoneum and then the abdominal musculature were reapproximated in the midline using 2-0 Vicryl.  The fascia was closed using a running suture of 0 Vicryl followed by three interrupted suture of 0 Vicryl.  Subcutaneous layer had adequate hemostasis.  The subcutaneous layer was closed using a running suture of 2-0  Vicryl.  The skin was reapproximated using skin staples. Sponge, needle, and instrument counts were correct on two occasions.  The estimated blood loss was 700 cc.  Patient tolerated her procedure well.  She was taken to the recovery room in stable condition.  The infant was taken to the regular nursery in stable condition. Dictated by:   Janine Limbo, M.D. Attending Physician:  Leonard Schwartz DD:  06/01/01 TD:  06/01/01 Job: [id number]

## 2010-10-30 NOTE — Op Note (Signed)
[name], [name] NO.:  [id number]   MEDICAL RECORD NO.:  [id number]          PATIENT TYPE:  AMB   LOCATION:  SDC                           FACILITY:  [facility]   PHYSICIAN:  Janine Limbo, M.D.DATE OF BIRTH:  1971-02-11   DATE OF PROCEDURE:  04/13/2006  DATE OF DISCHARGE:                                 OPERATIVE REPORT   PREOPERATIVE DIAGNOSES:  1. Pelvic pain.  2. Dysmenorrhea.  3. History of endometriosis.  4. Fibroid uterus.  5. Anemia (hemoglobin 11.9).   POSTOPERATIVE DIAGNOSES:  1. Pelvic pain.  2. Dysmenorrhea.  3. History of endometriosis.  4. Fibroid uterus.  5. Anemia (hemoglobin 11.9).   PROCEDURE:  1. Vaginal hysterectomy.  2. Bilateral salpingectomy.   SURGEON:  Dr. Leonard Schwartz   FIRST ASSISTANT:  Dr. Dierdre Forth.   ANESTHETIC:  General.   DISPOSITION:  [name] is a 40 year old female, para 1-0-0-1, who presents  with the above-mentioned diagnoses.  The patient understands the indications  for her surgical procedure, and she accepts the risks of, but not limited  to, anesthetic complications, bleeding, infections, and possible damage to  the surrounding organs.   FINDINGS:  The uterus was upper limits of normal size, and the weight was  approximately 159 grams.  There was a fibroid present that measured  approximately 2 cm.  No adhesions were appreciated.  The fallopian tubes and  the ovaries appeared normal.  There was no evidence of endometriosis as we  could tell.   PROCEDURE:  The patient was taken to the operating room where a general  anesthetic was given.  The patient's abdomen, perineum, and vagina were  prepped with multiple layers of Betadine.  A Foley catheter was placed in  the bladder.  The patient was then sterilely draped.  Examination under  anesthesia was performed.  The cervix was injected with 30 mL of a diluted  solution of Pitressin and saline.  A circumferential incision was made  around  the cervix, and the mucosa was advanced anteriorly and posteriorly.  The anterior cul-de-sac and then the posterior cul-de-sac was sharply  entered.  Alternating from right to left, the uterosacral ligaments,  paracervical tissues, parametrial tissues, and uterine arteries were  clamped, cut, sutured, and tied securely.  The uterus was inverted through  the posterior colpotomy.  The upper pedicles were secured, and the uterus  was transected from the operative field.  We then placed a free tie and then  a suture ligature around each pedicle.  Hemostasis was noted to be adequate.  We were able to better carefully access the pelvis and, again, there was no  evidence of endometriosis.  Because the fallopian tubes were easily  accessible, we then clamped the fallopian tubes, and each were removed.  Figure-of-eight sutures were used for hemostasis.  The sutures attached to  the uterosacral ligaments were brought out through the vaginal angles and  tied securely.  A McCall culdoplasty suture was placed in the posterior cul-  de-sac incorporating the uterosacral ligaments bilaterally and the posterior  peritoneum.  A final check  was made for hemostasis and, again, hemostasis  was confirmed.  The vaginal cuff was then closed using figure-of-eight  sutures, incorporating the anterior vaginal mucosa, the anterior peritoneum,  the posterior peritoneum, and the posterior vaginal mucosa.  Zero Vicryl was  the suture material used throughout the procedure.  The McCall culdoplasty  suture was tied securely, and the apex of the vagina was noted to elevate  into the mid pelvis.  Sponge, needle, and instrument counts were correct on  2 occasions.  The estimated blood loss was 100 mL.  The estimated urine  output was 250 mL, and the urine was noted to be clear.  The patient  received 1400 mL of IV fluid.  The patient was returned to the supine  position after her exam under anesthesia was repeated.  She was  taken to the  recovery room in stable condition.  She tolerated her procedure well.  The  uterus and the fallopian tubes were sent to pathology for evaluation.      Janine Limbo, M.D.  Electronically Signed     AVS/MEDQ  D:  04/13/2006  T:  04/13/2006  Job:  [id number]

## 2010-10-30 NOTE — H&P (Signed)
NAMEANISSIA, WESSELLS NO.:  [id number]   MEDICAL RECORD NO.:  [id number]          PATIENT TYPE:  AMB   LOCATION:  SDC                           FACILITY:  [facility]   PHYSICIAN:  Janine Limbo, M.D.DATE OF BIRTH:  1970-12-24   DATE OF ADMISSION:  04/13/2006  DATE OF DISCHARGE:                                HISTORY & PHYSICAL   HISTORY OF PRESENT ILLNESS:  Ms. Dyches is a 40 year old female, para 1-0-0-  1, who presents for vaginal hysterectomy.  The patient has been followed at  the [facility].  The patient complains of severe dysmenorrhea.  The  patient was noted to have endometriosis in March 2007.  We have tried to  control the patient's symptoms with medications including pain medications  and hormonal therapy.  At this point, she wishes to proceed with definitive  therapy because of her continued pain.  The patient's most recent Pap smear  was within normal limits.  Her endometrial biopsy was benign.  The patient  had a diagnostic laparoscopy in 2001.  The patient had a cesarean section in  2002.   OPERATIVE FINDINGS:  At the time of the patient's laparoscopy included filmy  adhesions between the omentum and the anterior abdominal wall.  There were  also adhesions between the bowel and the pelvic side walls.  The anterior  and posterior cul-de-sacs showed signs of endometriosis but were felt to be  clear for surgery.   DRUG ALLERGIES:  NO KNOWN DRUG ALLERGIES.   PAST MEDICAL HISTORY:  1. The patient has a history of ulcers.  2. Mitral valve prolapse.  She was told that she did not need prophylactic      antibiotics for surgery, however.  3. The patient had a tonsillectomy performed at age 26.   CURRENT MEDICATIONS:  Fioricet for pain.   OBSTETRICAL HISTORY:  The patient has had one cesarean section at term.   SOCIAL HISTORY:  The patient drinks alcohol socially.  She denies  cigarette  use and other recreational drug uses.   REVIEW OF SYSTEMS:  Please see history of present illness.   FAMILY HISTORY:  The patient's mother had hypertension.   PHYSICAL EXAMINATION:  VITAL SIGNS:  Weight is 191 pounds.  HEENT:  Within normal limits.  CHEST:  Clear.  HEART:  Regular rate and rhythm.  BREASTS:  Without masses.  ABDOMEN:  Nontender.  EXTREMITIES:  Grossly normal.  NEUROLOGIC:  Grossly normal.  PELVIC:  External genitalia is normal.  Vagina is normal.  Cervix is  nontender.  Uterus is upper limits normal size, shape, and consistency.  Adnexa:  No masses.  Rectovaginal exam confirms.   ASSESSMENT:  1. Dysmenorrhea and pain.  2. Endometriosis.   PLAN:  The patient will undergo a vaginal hysterectomy.  She understands the  indications for her surgical procedure as well as the alternative therapies.  She accepts the risk of, but not limited to, anesthetic complications,  bleeding, infections, and possible damage to the surrounding organs.      Merton Border  Zack Seal, M.D.  Electronically Signed     AVS/MEDQ  D:  04/06/2006  T:  04/06/2006  Job:  [id number]

## 2010-11-03 ENCOUNTER — Telehealth: Payer: Self-pay | Admitting: Internal Medicine

## 2010-11-03 NOTE — Telephone Encounter (Signed)
Left message to call back  

## 2010-11-03 NOTE — Telephone Encounter (Signed)
Per pt calling, discuss last lab work.

## 2010-11-03 NOTE — Telephone Encounter (Signed)
Pt aware of lab results, her potassium was slightly elevated her torsemide was stopped advised her to stop potassium as well, she is agreeable, she states she has taken the diuretic a couple of times due to extra fluid advised that was ok to do and to take a potassium tab if she took the diuretic

## 2010-11-05 ENCOUNTER — Encounter (HOSPITAL_BASED_OUTPATIENT_CLINIC_OR_DEPARTMENT_OTHER): Payer: Medicare Other | Admitting: Oncology

## 2010-11-05 ENCOUNTER — Other Ambulatory Visit: Payer: Self-pay | Admitting: Oncology

## 2010-11-05 DIAGNOSIS — C50919 Malignant neoplasm of unspecified site of unspecified female breast: Secondary | ICD-10-CM

## 2010-11-05 DIAGNOSIS — I429 Cardiomyopathy, unspecified: Secondary | ICD-10-CM

## 2010-11-05 DIAGNOSIS — R635 Abnormal weight gain: Secondary | ICD-10-CM

## 2010-11-05 DIAGNOSIS — Z170 Estrogen receptor positive status [ER+]: Secondary | ICD-10-CM

## 2010-11-05 LAB — CBC & DIFF AND RETIC
Basophils Absolute: 0.1 10*3/uL (ref 0.0–0.1)
EOS%: 3.3 % (ref 0.0–7.0)
Eosinophils Absolute: 0.3 10*3/uL (ref 0.0–0.5)
HGB: 10.4 g/dL — ABNORMAL LOW (ref 11.6–15.9)
LYMPH%: 37.9 % (ref 14.0–49.7)
MCH: 29.4 pg (ref 25.1–34.0)
MCV: 91 fL (ref 79.5–101.0)
MONO%: 8.3 % (ref 0.0–14.0)
NEUT#: 3.9 10*3/uL (ref 1.5–6.5)
Platelets: 233 10*3/uL (ref 145–400)

## 2010-11-05 LAB — MORPHOLOGY: PLT EST: ADEQUATE

## 2010-11-10 LAB — COMPREHENSIVE METABOLIC PANEL
AST: 38 U/L — ABNORMAL HIGH (ref 0–37)
Alkaline Phosphatase: 81 U/L (ref 39–117)
BUN: 10 mg/dL (ref 6–23)
Creatinine, Ser: 0.9 mg/dL (ref 0.40–1.20)
Total Bilirubin: 0.4 mg/dL (ref 0.3–1.2)

## 2010-11-10 LAB — ERYTHROPOIETIN: Erythropoietin: 32.8 m[IU]/mL (ref 2.6–34.0)

## 2010-11-10 LAB — FOLATE RBC: RBC Folate: 2498 ng/mL (ref >=366)

## 2010-11-10 LAB — PROTEIN ELECTROPHORESIS, SERUM
Albumin ELP: 53.2 % — ABNORMAL LOW (ref 55.8–66.1)
Alpha-1-Globulin: 4.5 % (ref 2.9–4.9)

## 2010-11-26 ENCOUNTER — Ambulatory Visit (INDEPENDENT_AMBULATORY_CARE_PROVIDER_SITE_OTHER): Payer: Medicare Other | Admitting: Internal Medicine

## 2010-11-26 ENCOUNTER — Encounter: Payer: Self-pay | Admitting: Internal Medicine

## 2010-11-26 VITALS — BP 104/66 | HR 86 | Resp 20 | Ht 67.0 in | Wt 235.0 lb

## 2010-11-26 DIAGNOSIS — I429 Cardiomyopathy, unspecified: Secondary | ICD-10-CM

## 2010-11-26 DIAGNOSIS — R0602 Shortness of breath: Secondary | ICD-10-CM

## 2010-11-26 DIAGNOSIS — I509 Heart failure, unspecified: Secondary | ICD-10-CM

## 2010-11-26 DIAGNOSIS — I5022 Chronic systolic (congestive) heart failure: Secondary | ICD-10-CM

## 2010-11-26 DIAGNOSIS — I959 Hypotension, unspecified: Secondary | ICD-10-CM

## 2010-11-26 DIAGNOSIS — Z79899 Other long term (current) drug therapy: Secondary | ICD-10-CM

## 2010-11-26 NOTE — Assessment & Plan Note (Signed)
Resolved with decrease in diuretic.

## 2010-11-26 NOTE — Patient Instructions (Signed)
Your physician recommends that you schedule a follow-up appointment in: 3 MONTHS WITH DR Gala Romney  Your physician recommends that you continue on your current medications as directed. Please refer to the Current Medication list given to you today.  Your physician recommends that you return for lab work in: 1 MONTH BMET BNP  DX  425.4 V58.69

## 2010-11-26 NOTE — Assessment & Plan Note (Addendum)
Doing great. NYHA I. Can use demadex as needed. Keep other meds as is for now. Check BMET and BNP in 1 month.

## 2010-11-26 NOTE — Progress Notes (Signed)
HPI:  [name] is a 40 year old woman with a history of morbid obesity, depression, fibromyalgia, congestive heart failure secondary to nonischemic cardiomyopathy related to her chemotherapy for breast cancer. Last chem 2008. Still in remission.    CPX test  Feb 2010. Peak VO2 was 14.5 which was 71% of predicted. When corrected for body weight the VO2 was 20.7. The slope was 27. RER 1.20. O2 pulse 73%. Overall this was felt to be only a very mild functional limitation due to her obesity and mild circulatory   Had echo 11/10 which showed EF back down to 25%. So patient underwent repeat echo, CPX and RHC for profound fatigue.  Echo 2/11 showed EF 50% RHC: RA 4 PA 26/9 (16) PCWP 9 Fick CO 5.7/ 2.5    CPX 2/11: pVO2 13.0 (72%) correct for ideal wt 58ml/kg/min RER 1.06 (submax) slope 28.2 O2 pulse 91% - felt no signifcant cardiac limitation. + deconditioning. Echo 1/12 EF 40%. So we did MUGA EF 58%.   Over last few visits BP has been very low so demadex cut back and eventually weaned to prn. She also cut her carvedilol from 18.75 to 12.5 bid. Here for f/u  Presyncope resolved. Had increasing dyspnea a couple of days ago. + edema. Took one dose of demadex and lost 7 pounds. Now feeling good again. Went to [place] with her daughter this weekend and was able to walk all over. Weight down almost 40-45 pounds in past year.   ROS: All systems negative except as listed in HPI, PMH and Problem List.  Past Medical History  Diagnosis Date  . CHF (congestive heart failure)     due to non-ischemic cardiomyopathy, thought to be chemotherapy induced  . Breast cancer   . Peripheral neuropathy     chemo- induced  . Hypertension     c/b orthostatic hypotetion  . Depression   . Palpitation     normal sinus rythm only on 21 day heart monitor  . Cough   . Obesity   . Dizziness   . Fibromyalgia     Current Outpatient Prescriptions  Medication Sig Dispense Refill  . b complex vitamins tablet Take 1 tablet by  mouth daily.        . Calcium-Vitamin D (CALTRATE 600 PLUS-VIT D PO) Take 1 tablet by mouth daily.        . carvedilol (COREG) 12.5 MG tablet 2 (two) times daily with a meal.       . Cholecalciferol (VITAMIN D3) 5000 UNITS CAPS Take 1 capsule by mouth daily.        . Coenzyme Q10 (CO Q-10 PO) 1 tab po qd       . Flaxseed, Linseed, (FLAXSEED OIL) OIL 2 teaspoons daily       . letrozole (FEMARA) 2.5 MG tablet Take 2.5 mg by mouth daily.        Marland Kitchen lisinopril (PRINIVIL,ZESTRIL) 10 MG tablet Take 10 mg by mouth 2 (two) times daily.        Marland Kitchen LORazepam (ATIVAN) 1 MG tablet Take 1 mg by mouth 2 (two) times daily.        . methocarbamol (ROBAXIN) 750 MG tablet Take 750 mg by mouth daily.        . Multiple Vitamins-Minerals (CENTRUM SILVER ULTRA WOMENS PO) Take 1 tablet by mouth daily.        Marland Kitchen oxyCODONE-acetaminophen (PERCOCET) 5-325 MG per tablet 4 times daily      . potassium chloride SA (K-DUR,KLOR-CON) 20 MEQ  tablet Take 20 mEq by mouth as needed. With fluid pill      . pregabalin (LYRICA) 200 MG capsule Take by mouth daily.       . RABEprazole (ACIPHEX) 20 MG tablet Take 20 mg by mouth as needed.        Marland Kitchen spironolactone (ALDACTONE) 25 MG tablet 25 mg. Take 1/2 tablet daily       . torsemide (DEMADEX) 20 MG tablet Take 20 mg by mouth as needed.        . traZODone (DESYREL) 100 MG tablet Take 50 mg by mouth as needed.       . zolpidem (AMBIEN) 10 MG tablet Take 10 mg by mouth daily.       Marland Kitchen DISCONTD: Prenatal Vit-Fe Fumarate-FA (PRENATAL S) 27-0.8 MG tablet Take 1 tablet by mouth daily.           PHYSICAL EXAM: Filed Vitals:   11/26/10 1157  BP: 104/66  Pulse: 86  Resp: 20   Vitals - 1 value per visit 08/14/2009 03/18/2010 05/25/2010 09/30/2010 10/19/2010  Weight (lb) 259 265 253 242 240.12   Vitals - 1 value per visit 11/26/2010  Weight (lb) 235   General:  Well appearing. No resp difficulty HEENT: normal Neck: supple. JVP flat. Carotids 2+ bilaterally; no bruits. No lymphadenopathy or  thryomegaly appreciated. Cor: PMI normal. Regular rate & rhythm. No rubs, gallops or murmurs. Lungs: clear Abdomen: obese. soft, nontender, nondistended. No hepatosplenomegaly. No bruits or masses. Good bowel sounds. Extremities: no cyanosis, clubbing, rash, edema Neuro: alert & orientedx3, cranial nerves grossly intact. Moves all 4 extremities w/o difficulty. Affect pleasant.    ECG: NSR 86. LVH diffuse TWI (chronic)   ASSESSMENT & PLAN:

## 2010-11-28 ENCOUNTER — Emergency Department (HOSPITAL_COMMUNITY)
Admission: EM | Admit: 2010-11-28 | Discharge: 2010-11-28 | Disposition: A | Discharge status: Home / Self Care | Payer: Medicare Other | Attending: Emergency Medicine | Admitting: Emergency Medicine

## 2010-11-28 ENCOUNTER — Emergency Department (HOSPITAL_COMMUNITY): Payer: Medicare Other

## 2010-11-28 DIAGNOSIS — Z853 Personal history of malignant neoplasm of breast: Secondary | ICD-10-CM | POA: Insufficient documentation

## 2010-11-28 DIAGNOSIS — F3289 Other specified depressive episodes: Secondary | ICD-10-CM | POA: Insufficient documentation

## 2010-11-28 DIAGNOSIS — R0602 Shortness of breath: Secondary | ICD-10-CM | POA: Insufficient documentation

## 2010-11-28 DIAGNOSIS — K219 Gastro-esophageal reflux disease without esophagitis: Secondary | ICD-10-CM | POA: Insufficient documentation

## 2010-11-28 DIAGNOSIS — Z79899 Other long term (current) drug therapy: Secondary | ICD-10-CM | POA: Insufficient documentation

## 2010-11-28 DIAGNOSIS — F329 Major depressive disorder, single episode, unspecified: Secondary | ICD-10-CM | POA: Insufficient documentation

## 2010-11-28 DIAGNOSIS — R000 Tachycardia, unspecified: Secondary | ICD-10-CM | POA: Insufficient documentation

## 2010-11-28 DIAGNOSIS — F411 Generalized anxiety disorder: Secondary | ICD-10-CM | POA: Insufficient documentation

## 2010-11-28 DIAGNOSIS — I509 Heart failure, unspecified: Secondary | ICD-10-CM | POA: Insufficient documentation

## 2010-11-28 DIAGNOSIS — R0789 Other chest pain: Secondary | ICD-10-CM | POA: Insufficient documentation

## 2010-11-28 LAB — D-DIMER, QUANTITATIVE: D-Dimer, Quant: 0.4 ug/mL-FEU (ref 0.00–0.48)

## 2010-11-28 LAB — PROTIME-INR
INR: 0.99 (ref 0.00–1.49)
Prothrombin Time: 13.3 seconds (ref 11.6–15.2)

## 2010-11-28 LAB — DIFFERENTIAL
Basophils Absolute: 0.1 10*3/uL (ref 0.0–0.1)
Eosinophils Relative: 2 % (ref 0–5)
Lymphocytes Relative: 45 % (ref 12–46)
Neutro Abs: 3.7 10*3/uL (ref 1.7–7.7)

## 2010-11-28 LAB — POCT I-STAT, CHEM 8
Chloride: 106 mEq/L (ref 96–112)
Glucose, Bld: 104 mg/dL — ABNORMAL HIGH (ref 70–99)
HCT: 35 % — ABNORMAL LOW (ref 36.0–46.0)
Potassium: 3.6 mEq/L (ref 3.5–5.1)
Sodium: 139 mEq/L (ref 135–145)

## 2010-11-28 LAB — CK TOTAL AND CKMB (NOT AT ARMC): CK, MB: 2.8 ng/mL (ref 0.3–4.0)

## 2010-11-28 LAB — APTT: aPTT: 25 seconds (ref 24–37)

## 2010-11-28 LAB — CBC
HCT: 33.5 % — ABNORMAL LOW (ref 36.0–46.0)
RDW: 13.3 % (ref 11.5–15.5)
WBC: 8.4 10*3/uL (ref 4.0–10.5)

## 2010-11-30 ENCOUNTER — Telehealth: Payer: Self-pay | Admitting: Internal Medicine

## 2010-11-30 NOTE — Telephone Encounter (Signed)
C/o pressure in center of chest, sob, rapid heart beat. Went to er on sat.

## 2010-11-30 NOTE — Telephone Encounter (Signed)
DB feels could just be anxiety attacks, he states he will call pt to discuss tom if able, pt is aware and states it could be but when it has occurred she hasn't been under stress, she ask about blockages and possible testing, will let Dr Gala Romney discuss w/her

## 2010-11-30 NOTE — Telephone Encounter (Addendum)
Pt was in ER on Sat, she was driving home and dev. Chest pressure and SOB, she felt like she was going to pass out when she got home she didn't feel any better and family called 9-1-1 she was taken to ER, EKG and labs were ok and symptoms resolved on its own, no ntg was given b/c of her low BP.  She states yest she felt ok but she just kind of laid around and took it easy.  Today she was ok until about 3:30 or 4 pm and then she was laying on her bed and dev the same symptoms chest pressure "heaviness" in center of chest and SOB it has eased up some but is just off/on she is concerned, will discuss w/Dr Bensimhon and call her back

## 2010-12-02 ENCOUNTER — Emergency Department (HOSPITAL_COMMUNITY)
Admission: EM | Admit: 2010-12-02 | Discharge: 2010-12-02 | Disposition: A | Discharge status: Home / Self Care | Payer: Medicare Other | Attending: Emergency Medicine | Admitting: Emergency Medicine

## 2010-12-02 ENCOUNTER — Emergency Department (HOSPITAL_COMMUNITY): Payer: Medicare Other

## 2010-12-02 DIAGNOSIS — R141 Gas pain: Secondary | ICD-10-CM | POA: Insufficient documentation

## 2010-12-02 DIAGNOSIS — R002 Palpitations: Secondary | ICD-10-CM | POA: Insufficient documentation

## 2010-12-02 DIAGNOSIS — Z853 Personal history of malignant neoplasm of breast: Secondary | ICD-10-CM | POA: Insufficient documentation

## 2010-12-02 DIAGNOSIS — Z79899 Other long term (current) drug therapy: Secondary | ICD-10-CM | POA: Insufficient documentation

## 2010-12-02 DIAGNOSIS — R142 Eructation: Secondary | ICD-10-CM | POA: Insufficient documentation

## 2010-12-02 DIAGNOSIS — F329 Major depressive disorder, single episode, unspecified: Secondary | ICD-10-CM | POA: Insufficient documentation

## 2010-12-02 DIAGNOSIS — R0602 Shortness of breath: Secondary | ICD-10-CM | POA: Insufficient documentation

## 2010-12-02 DIAGNOSIS — I509 Heart failure, unspecified: Secondary | ICD-10-CM | POA: Insufficient documentation

## 2010-12-02 DIAGNOSIS — I10 Essential (primary) hypertension: Secondary | ICD-10-CM | POA: Insufficient documentation

## 2010-12-02 DIAGNOSIS — K219 Gastro-esophageal reflux disease without esophagitis: Secondary | ICD-10-CM | POA: Insufficient documentation

## 2010-12-02 DIAGNOSIS — R079 Chest pain, unspecified: Secondary | ICD-10-CM | POA: Insufficient documentation

## 2010-12-02 DIAGNOSIS — F3289 Other specified depressive episodes: Secondary | ICD-10-CM | POA: Insufficient documentation

## 2010-12-02 LAB — DIFFERENTIAL
Lymphocytes Relative: 33 % (ref 12–46)
Lymphs Abs: 2.8 10*3/uL (ref 0.7–4.0)
Monocytes Absolute: 0.6 10*3/uL (ref 0.1–1.0)
Monocytes Relative: 7 % (ref 3–12)
Neutro Abs: 4.8 10*3/uL (ref 1.7–7.7)

## 2010-12-02 LAB — CBC
HCT: 33.7 % — ABNORMAL LOW (ref 36.0–46.0)
Hemoglobin: 11.5 g/dL — ABNORMAL LOW (ref 12.0–15.0)
MCH: 29.9 pg (ref 26.0–34.0)
MCHC: 34.1 g/dL (ref 30.0–36.0)
MCV: 87.8 fL (ref 78.0–100.0)
RBC: 3.84 MIL/uL — ABNORMAL LOW (ref 3.87–5.11)

## 2010-12-02 LAB — POCT I-STAT, CHEM 8
BUN: 8 mg/dL (ref 6–23)
Creatinine, Ser: 0.7 mg/dL (ref 0.50–1.10)
Glucose, Bld: 98 mg/dL (ref 70–99)
Hemoglobin: 12.6 g/dL (ref 12.0–15.0)
TCO2: 22 mmol/L (ref 0–100)

## 2010-12-02 LAB — CK TOTAL AND CKMB (NOT AT ARMC): Relative Index: 1.1 (ref 0.0–2.5)

## 2010-12-02 LAB — PRO B NATRIURETIC PEPTIDE: Pro B Natriuretic peptide (BNP): 160.8 pg/mL — ABNORMAL HIGH (ref 0–125)

## 2010-12-09 ENCOUNTER — Ambulatory Visit (INDEPENDENT_AMBULATORY_CARE_PROVIDER_SITE_OTHER): Payer: Medicare Other | Admitting: Physician Assistant

## 2010-12-09 ENCOUNTER — Encounter: Payer: Self-pay | Admitting: Physician Assistant

## 2010-12-09 DIAGNOSIS — I5022 Chronic systolic (congestive) heart failure: Secondary | ICD-10-CM

## 2010-12-09 DIAGNOSIS — R0602 Shortness of breath: Secondary | ICD-10-CM

## 2010-12-09 DIAGNOSIS — R002 Palpitations: Secondary | ICD-10-CM

## 2010-12-09 DIAGNOSIS — R079 Chest pain, unspecified: Secondary | ICD-10-CM

## 2010-12-09 DIAGNOSIS — K219 Gastro-esophageal reflux disease without esophagitis: Secondary | ICD-10-CM

## 2010-12-09 NOTE — Progress Notes (Signed)
History of Present Illness: Primary Cardiologist:  Dr. Arvilla Meres  [name] is a 40 y.o. female with a history of morbid obesity, depression, fibromyalgia, congestive heart failure secondary to nonischemic cardiomyopathy related to her chemotherapy for breast cancer. Last chem 2008. Still in remission.   CPX test Feb 2010. Peak VO2 was 14.5 which was 71% of predicted. When corrected for body weight the VO2 was 20.7. The slope was 27. RER 1.20. O2 pulse 73%. Overall this was felt to be only a very mild functional limitation due to her obesity and mild circulatory   Had echo 11/10 which showed EF back down to 25%. So patient underwent repeat echo, CPX and RHC for profound fatigue.   Echo 2/11 showed EF 50%  RHC: RA 4 PA 26/9 (16) PCWP 9 Fick CO 5.7/ 2.5  CPX 2/11: pVO2 13.0 (72%) correct for ideal wt 37ml/kg/min RER 1.06 (submax) slope 28.2 O2 pulse 91% - felt no signifcant cardiac limitation. + deconditioning.   Echo 1/12 EF 40%. So we did MUGA EF 58%.   Over last few visits BP has been very low so demadex cut back and eventually weaned to prn. She also cut her carvedilol from 18.75 to 12.5 bid.   Weight down almost 40-45 pounds in past year.  Saw Dr. Gala Romney couple weeks ago.  Doing well at that time.  Class 1.  Follow up planned in 3 months.  She presents for follow up after going to the emergency room x2.  Her first trip was on 6/16.  She describes a flushing sensation and what sounds like near syncope followed by tachycardia palpitations and chest pressure.  She felt short of breath and came to the emergency room.  D-dimer was negative.  She did have a BNP of 217.5.  Cardiac markers were negative.  She was reassured and discharged home.  She continued to note shortness of breath with exertion and then had a recurrent episode of a flushed feeling and chest pressure and went to the emergency room again on 6/20.  She noted increased belching and was given a prescription for  protonix.  She's been taking it everyday since.  She has had a problem with belching and nausea and vomiting over the last several weeks.  She continues to note increased dyspnea with exertion since going to the emergency room initially.  She's probably NYHA class II when she was previously class I.  She denies syncope.  She sleeps on 3 pillows chronically.  She has noted PND.  She denies pedal edema.  She was told to see a gastroenterologist.  She was also told to follow up here.  Past Medical History  Diagnosis Date  . CHF (congestive heart failure)     due to non-ischemic cardiomyopathy, thought to be chemotherapy induced  . Breast cancer   . Peripheral neuropathy     chemo- induced  . Hypertension     c/b orthostatic hypotetion  . Depression   . Palpitation     normal sinus rythm only on 21 day heart monitor  . Cough   . Obesity   . Dizziness   . Fibromyalgia     Current Outpatient Prescriptions  Medication Sig Dispense Refill  . b complex vitamins tablet Take 1 tablet by mouth daily.        . Calcium-Vitamin D (CALTRATE 600 PLUS-VIT D PO) Take 1 tablet by mouth daily.        . carvedilol (COREG) 12.5 MG tablet 2 (two)  times daily with a meal.       . Cholecalciferol (VITAMIN D3) 5000 UNITS CAPS Take 1 capsule by mouth daily.        . Coenzyme Q10 (CO Q-10 PO) 1 tab po qd       . Flaxseed, Linseed, (FLAXSEED OIL) OIL 2 teaspoons daily       . letrozole (FEMARA) 2.5 MG tablet Take 2.5 mg by mouth daily.        Marland Kitchen lisinopril (PRINIVIL,ZESTRIL) 10 MG tablet Take 10 mg by mouth 2 (two) times daily.        Marland Kitchen LORazepam (ATIVAN) 1 MG tablet Take 1 mg by mouth 2 (two) times daily.        . methocarbamol (ROBAXIN) 750 MG tablet Take 750 mg by mouth daily.        . Multiple Vitamins-Minerals (CENTRUM SILVER ULTRA WOMENS PO) Take 1 tablet by mouth daily.        Marland Kitchen oxyCODONE-acetaminophen (PERCOCET) 5-325 MG per tablet 4 times daily      . pantoprazole (PROTONIX) 40 MG tablet Take 1 tablet by  mouth Daily.      . potassium chloride SA (K-DUR,KLOR-CON) 20 MEQ tablet Take 20 mEq by mouth as needed. With fluid pill      . pregabalin (LYRICA) 200 MG capsule Take by mouth daily.       . RABEprazole (ACIPHEX) 20 MG tablet Take 20 mg by mouth as needed.        Marland Kitchen spironolactone (ALDACTONE) 25 MG tablet 25 mg. Take 1/2 tablet daily       . torsemide (DEMADEX) 20 MG tablet Take 20 mg by mouth as needed.        . traZODone (DESYREL) 100 MG tablet Take 50 mg by mouth as needed.       . zolpidem (AMBIEN) 10 MG tablet Take 10 mg by mouth daily.         Allergies: Allergies  Allergen Reactions  . Metoclopramide Hcl    Social history:  Nonsmoker  Family history:  CAD:  Mom had stents placed in her early 24s  ROS:  See history of present illness.  She denies fevers, chills, cough, melena, hematochezia.  All other systems reviewed and negative.  Vital Signs: BP 102/70  Pulse 73  Ht 5\' 7"  (1.702 m)  Wt 236 lb (107.049 kg)  BMI 36.96 kg/m2  PHYSICAL EXAM: Well nourished, well developed, in no acute distress HEENT: normal Neck: no JVD Endocrine: No thyromegaly Vascular: No carotid bruits Cardiac:  normal S1, S2; RRR; no murmur, No S3 Lungs:  clear to auscultation bilaterally, no wheezing, rhonchi or rales Abd: soft, nontender, no hepatomegaly Ext: no edema Skin: warm and dry Neuro:  CNs 2-12 intact, no focal abnormalities noted Psych: Normal affect  EKG:  Sinus rhythm, heart rate 73, normal axis, T wave inversions in leads 2, 3, aVF and V5-V6; no significant change when compared to prior tracings  ASSESSMENT AND PLAN:

## 2010-12-09 NOTE — Patient Instructions (Addendum)
Your physician recommends that you schedule a follow-up appointment in: 2-3 WEEKS WITH SCOTT WEAVER, PA-C IMPORTANT THAT DR. Gala Romney IN THE OFFICE THAT DAY AS WELL.  Your physician recommends that you return for lab work in: 1 WEEK BMET 428.22, TSH 785.1, BNP 428.22  Your physician has requested that you have en exercise stress myoview 786.50. For further information please visit [email or web address] Please follow instruction sheet, as given.   SCOTT WEAVER, PA-C HAS ASKED FOR YOU TO TAKE YOUR DEMADEX AND POTASSIUM EVERY OTHER DAY FOR A TOTAL OF 3 DOSES; AFTER THE 3 DOSES YOU MAY RESUME TO TAKING THOSE AS NEEDED.

## 2010-12-09 NOTE — Assessment & Plan Note (Signed)
Etiology uncertain.  I question whether or not she has mild volume overload which may contribute.  She's also had some palpitations.  She had a negative monitor in the past.  Her BNP was elevated recently in the emergency room.  In reviewing her chart, she's had a fairly normal BNP in recent past.  I suggested that she take her Demadex and potassium every other day for a total of 3 dosages.  We'll check a basic metabolic panel in one week to followup on her renal function and potassium.  She will resume her p.r.n. Dosing after that.  We may need to have a stricter recommendation for her p.r.n. Demadex.  In other words, she may need to take Demadex with a weight gain of 1-2 pounds.  She is concerned about coronary disease.  Her mother is with her today and she is concerned about this as well.  She does have a fairly significant family history.  I will set her up for a stress Myoview to rule out ischemia.  She will followup in 2-3 weeks with Dr. Gala Romney or me.

## 2010-12-09 NOTE — Assessment & Plan Note (Signed)
Continue PPI.  If no improvement, she may need to see GI.  She can follow up with her PCP.

## 2010-12-09 NOTE — Assessment & Plan Note (Signed)
Uncertain about the etiology of this.  She had normal monitor in the past.  We may need to repeat another event monitor.  Check a TSH with her blood work in one week.

## 2010-12-09 NOTE — Assessment & Plan Note (Signed)
Increase diuresis for several days as noted.  Followup in 2-3 weeks with me or Dr. Gala Romney.

## 2010-12-09 NOTE — Telephone Encounter (Signed)
Pt saw [name] 6/27 and myoview was ordered

## 2010-12-09 NOTE — Assessment & Plan Note (Signed)
I suspect mild acute on chronic systolic heart failure.  Adjust Demadex as noted.  I will get a basic metabolic panel and BNP in one week.  Followup as noted.

## 2010-12-15 ENCOUNTER — Encounter: Payer: Self-pay | Admitting: *Deleted

## 2010-12-15 ENCOUNTER — Ambulatory Visit (HOSPITAL_COMMUNITY): Payer: Medicare Other | Attending: Internal Medicine | Admitting: Radiology

## 2010-12-15 ENCOUNTER — Other Ambulatory Visit (INDEPENDENT_AMBULATORY_CARE_PROVIDER_SITE_OTHER): Payer: Medicare Other | Admitting: *Deleted

## 2010-12-15 DIAGNOSIS — R0789 Other chest pain: Secondary | ICD-10-CM

## 2010-12-15 DIAGNOSIS — R0602 Shortness of breath: Secondary | ICD-10-CM

## 2010-12-15 DIAGNOSIS — R002 Palpitations: Secondary | ICD-10-CM

## 2010-12-15 DIAGNOSIS — R079 Chest pain, unspecified: Secondary | ICD-10-CM | POA: Insufficient documentation

## 2010-12-15 DIAGNOSIS — I5022 Chronic systolic (congestive) heart failure: Secondary | ICD-10-CM

## 2010-12-15 DIAGNOSIS — R0989 Other specified symptoms and signs involving the circulatory and respiratory systems: Secondary | ICD-10-CM

## 2010-12-15 DIAGNOSIS — Z79899 Other long term (current) drug therapy: Secondary | ICD-10-CM

## 2010-12-15 DIAGNOSIS — I959 Hypotension, unspecified: Secondary | ICD-10-CM

## 2010-12-15 DIAGNOSIS — I10 Essential (primary) hypertension: Secondary | ICD-10-CM

## 2010-12-15 LAB — BASIC METABOLIC PANEL
CO2: 27 mEq/L (ref 19–32)
GFR: 144.86 mL/min (ref >60.00)
Glucose, Bld: 112 mg/dL — ABNORMAL HIGH (ref 70–99)
Potassium: 4.5 mEq/L (ref 3.5–5.1)
Sodium: 140 mEq/L (ref 135–145)

## 2010-12-15 LAB — TSH: TSH: 1.01 u[IU]/mL (ref 0.35–5.50)

## 2010-12-15 MED ORDER — TECHNETIUM TC 99M TETROFOSMIN IV KIT
10.7000 | PACK | Freq: Once | INTRAVENOUS | Status: AC | PRN
  Administered 2010-12-15: 11 via INTRAVENOUS

## 2010-12-15 MED ORDER — TECHNETIUM TC 99M TETROFOSMIN IV KIT
33.0000 | PACK | Freq: Once | INTRAVENOUS | Status: AC | PRN
  Administered 2010-12-15: 33 via INTRAVENOUS

## 2010-12-15 NOTE — Progress Notes (Signed)
[facility] SITE 3 NUCLEAR MED [address] [place] [address] [phone]  Cardiology Nuclear Med Study  [name] is a 40 y.o. female [id number] [birth year]   Nuclear Med Background Indication for Stress Test:  Evaluation for Ischemia and Post Hospital 11/28/10 Near syncope, palpitations, CP & 12/02/10 CP History:  History of Chemo and 2/12 Echo:EF=40% Cardiac Risk Factors: Family History - CAD, Hypertension, NIDDM and Obesity  Symptoms:  Chest Pressure.  (last episode of chest discomfort was the end of last week), Dizziness, DOE/SOB, Fatigue, Nausea, Palpitations, Rapid HR, and Vomiting   Nuclear Pre-Procedure Caffeine/Decaff Intake:  None NPO After: 11:00pm   Lungs:   IV 0.9% NS with Angio Cath:  20g  IV Site: L Forearm  IV Started by:  Stanton Kidney, EMT-P  Chest Size (in):  38 Cup Size: D  Height: 5\' 7"  (1.702 m)  Weight:  232 lb (105.235 kg)  BMI:  Body mass index is 36.34 kg/(m^2). Tech Comments:  Coreg held > 14 hours, per patient.    Nuclear Med Study 1 or 2 day study: 1 day  Stress Test Type:  Stress  Reading MD: Charlton Haws, MD  Order Authorizing Provider:  Arvilla Meres, MD  Resting Radionuclide: Technetium 45m Tetrofosmin  Resting Radionuclide Dose: 10.7 mCi   Stress Radionuclide:  Technetium 28m Tetrofosmin  Stress Radionuclide Dose: 33 mCi           Stress Protocol Rest HR: 77 Stress HR: 169  Rest BP: 108/71 Stress BP: 137/59  Exercise Time (min): 7:15 METS: 9.1   Predicted Max HR: 180 bpm % Max HR: 93.89 bpm Rate Pressure Product: 21308   Dose of Adenosine (mg):  n/a Dose of Lexiscan: n/a mg  Dose of Atropine (mg): n/a Dose of Dobutamine: n/a mcg/kg/min (at max HR)  Stress Test Technologist: Smiley Houseman, CMA-N  Nuclear Technologist:  Doyne Keel, CNMT     Rest Procedure:  Myocardial perfusion imaging was performed at rest 45 minutes following the intravenous administration of Technetium 74m Tetrofosmin.  Rest ECG:  Nonspecific T-wave changes.  Stress Procedure:  The patient exercised for 7:15 on the treadmill utilizing the Bruce protocol.  The patient stopped due to fatigue and denied any chest pain.  There were no diagnostic ST-T wave changes.  Technetium 70m Tetrofosmin was injected at peak exercise and myocardial perfusion imaging was performed after a brief delay.  Stress ECG: No significant change from baseline ECG  QPS Raw Data Images:  Normal; no motion artifact; normal heart/lung ratio. Stress Images:  There is decreased uptake in the anterior wall. Rest Images:  There is decreased uptake in the anterior wall. Subtraction (SDS):  SDS 4 Transient Ischemic Dilatation (Normal <1.22):  .86 Lung/Heart Ratio (Normal <0.45):  .30  Quantitative Gated Spect Images QGS EDV:  147 ml QGS ESV:  104 ml QGS cine images:  Decreased EF.  Global hypokinesis worse in the mid and basal segments QGS EF: 29%  Impression Exercise Capacity:  Fair exercise capacity. BP Response:  Normal blood pressure response. Clinical Symptoms:  There is dyspnea. ECG Impression:  No significant ST segment change suggestive of ischemia. Comparison with Prior Nuclear Study: No previous nuclear study performed  Overall Impression:  Moderate anteroapical wall infarction with now ischemia.  EF severely depressed     Charlton Haws

## 2010-12-18 ENCOUNTER — Telehealth: Payer: Self-pay | Admitting: *Deleted

## 2010-12-18 DIAGNOSIS — R0602 Shortness of breath: Secondary | ICD-10-CM

## 2010-12-18 NOTE — Telephone Encounter (Signed)
Pt coming in 12/23/10 @ 7:30 to have echo b4 seeing SW as per SW... Danielle Rankin

## 2010-12-21 ENCOUNTER — Encounter: Payer: Self-pay | Admitting: *Deleted

## 2010-12-21 NOTE — Progress Notes (Addendum)
nuc med study routed to Dr. Kittie Plater. 12/21/10 [name]   EF back down. Suspect anterior defect is breast attenuation but given CP and EF back down - will need cath. Can we schedule for Friday in [facility] lab?

## 2010-12-23 ENCOUNTER — Encounter: Payer: Self-pay | Admitting: Physician Assistant

## 2010-12-23 ENCOUNTER — Ambulatory Visit (INDEPENDENT_AMBULATORY_CARE_PROVIDER_SITE_OTHER): Payer: Medicare Other | Admitting: Physician Assistant

## 2010-12-23 ENCOUNTER — Ambulatory Visit (HOSPITAL_COMMUNITY): Payer: Medicare Other | Attending: Family Medicine | Admitting: Radiology

## 2010-12-23 ENCOUNTER — Encounter: Payer: Self-pay | Admitting: *Deleted

## 2010-12-23 DIAGNOSIS — R079 Chest pain, unspecified: Secondary | ICD-10-CM

## 2010-12-23 DIAGNOSIS — R0602 Shortness of breath: Secondary | ICD-10-CM

## 2010-12-23 DIAGNOSIS — I959 Hypotension, unspecified: Secondary | ICD-10-CM

## 2010-12-23 DIAGNOSIS — R0989 Other specified symptoms and signs involving the circulatory and respiratory systems: Secondary | ICD-10-CM

## 2010-12-23 DIAGNOSIS — R002 Palpitations: Secondary | ICD-10-CM | POA: Insufficient documentation

## 2010-12-23 DIAGNOSIS — I080 Rheumatic disorders of both mitral and aortic valves: Secondary | ICD-10-CM | POA: Insufficient documentation

## 2010-12-23 DIAGNOSIS — I5022 Chronic systolic (congestive) heart failure: Secondary | ICD-10-CM

## 2010-12-23 DIAGNOSIS — I079 Rheumatic tricuspid valve disease, unspecified: Secondary | ICD-10-CM | POA: Insufficient documentation

## 2010-12-23 DIAGNOSIS — I379 Nonrheumatic pulmonary valve disorder, unspecified: Secondary | ICD-10-CM | POA: Insufficient documentation

## 2010-12-23 DIAGNOSIS — R0609 Other forms of dyspnea: Secondary | ICD-10-CM | POA: Insufficient documentation

## 2010-12-23 LAB — CBC WITH DIFFERENTIAL/PLATELET
Basophils Relative: 0.7 % (ref 0.0–3.0)
Eosinophils Absolute: 0.2 10*3/uL (ref 0.0–0.7)
Eosinophils Relative: 3 % (ref 0.0–5.0)
HCT: 39 % (ref 36.0–46.0)
Lymphs Abs: 2.7 10*3/uL (ref 0.7–4.0)
MCHC: 33.1 g/dL (ref 30.0–36.0)
MCV: 91.1 fl (ref 78.0–100.0)
Monocytes Absolute: 0.6 10*3/uL (ref 0.1–1.0)
Neutrophils Relative %: 45.4 % (ref 43.0–77.0)
Platelets: 253 10*3/uL (ref 150.0–400.0)

## 2010-12-23 LAB — BASIC METABOLIC PANEL
BUN: 11 mg/dL (ref 6–23)
CO2: 30 mEq/L (ref 19–32)
Chloride: 104 mEq/L (ref 96–112)
Creatinine, Ser: 0.8 mg/dL (ref 0.4–1.2)
Potassium: 4.5 mEq/L (ref 3.5–5.1)

## 2010-12-23 LAB — PROTIME-INR
INR: 1 ratio (ref 0.8–1.0)
Prothrombin Time: 11.4 s (ref 10.2–12.4)

## 2010-12-23 NOTE — Assessment & Plan Note (Addendum)
EF better than 29% on Echo today.  Abnormality on myoview probably breast atten.  Dr. Gala Romney also saw the patient today.  Patient somewhat hesitant to proceed with cath.  We discussed risks and benefits of cath today.  After a long discussion, will proceed with cath.  Risks and benefits of cardiac catheterization have been discussed with the patient.  These include bleeding, infection, kidney damage, stroke, heart attack, death.  The patient understands these risks and is willing to proceed.

## 2010-12-23 NOTE — Progress Notes (Signed)
Pt was seen by [name] and Dr Gala Romney on 7/11 and sch for cath on 7/13 [facility] Derin Matthes 10:51 AM

## 2010-12-23 NOTE — Assessment & Plan Note (Signed)
Volume appears stable.  Continue current meds.  Patient can adjust diuresis prn for increased weight.

## 2010-12-23 NOTE — Assessment & Plan Note (Signed)
BP stable.  No room to adjust CHF meds.

## 2010-12-23 NOTE — Progress Notes (Signed)
History of Present Illness: Primary Cardiologist:  Dr. Arvilla Meres  [name] is a 40 y.o. female with a history of morbid obesity, depression, fibromyalgia, congestive heart failure secondary to nonischemic cardiomyopathy related to her chemotherapy for breast cancer. Last chem 2008. Still in remission.   CPX test Feb 2010. Peak VO2 was 14.5 which was 71% of predicted. When corrected for body weight the VO2 was 20.7. The slope was 27. RER 1.20. O2 pulse 73%. Overall this was felt to be only a very mild functional limitation due to her obesity and mild circulatory   Had echo 11/10 which showed EF back down to 25%. So patient underwent repeat echo, CPX and RHC for profound fatigue.   Echo 2/11 showed EF 50%  RHC: RA 4 PA 26/9 (16) PCWP 9 Fick CO 5.7/ 2.5  CPX 2/11: pVO2 13.0 (72%) correct for ideal wt 36ml/kg/min RER 1.06 (submax) slope 28.2 O2 pulse 91% - felt no signifcant cardiac limitation. + deconditioning.   Echo 1/12 EF 40%. So we did MUGA EF 58%.   Over last few visits BP has been very low so demadex cut back and eventually weaned to prn. She also cut her carvedilol from 18.75 to 12.5 bid.   Weight down almost 40-45 pounds in past year.  I saw her a couple weeks ago for follow up after 2 trips to the ED for chest pain.  She was also noting DOE.  BNP was up some.  I increased her demadex and set up a a myoview.  Myoview done with mod anteroapical scar, no ischemia and EF now lower at 29%.  Anterior defect probably breast attenuation.  Follow up BNP was 58.  Echo done this am.  Dr. Gala Romney has reviewed [facility] and is considering cath.  She returns for follow up.  Still getting DOE.  Also, notes SOB at rest.  She takes extra fluid pills with improvement sometimes.  Sleeps on 3-4 pillows.  No PND.  No syncope.  Reviewed her echo.  Probably EF is 35-40%.     Past Medical History  Diagnosis Date  . CHF (congestive heart failure)     due to non-ischemic cardiomyopathy, thought  to be chemotherapy induced  . Breast cancer   . Peripheral neuropathy     chemo- induced  . Hypertension     c/b orthostatic hypotetion  . Depression   . Palpitation     normal sinus rythm only on 21 day heart monitor  . Cough   . Obesity   . Dizziness   . Fibromyalgia     Current Outpatient Prescriptions  Medication Sig Dispense Refill  . b complex vitamins tablet Take 1 tablet by mouth daily.        . Calcium-Vitamin D (CALTRATE 600 PLUS-VIT D PO) Take 1 tablet by mouth daily.        . carvedilol (COREG) 12.5 MG tablet 2 (two) times daily with a meal.       . Cholecalciferol (VITAMIN D3) 5000 UNITS CAPS Take 1 capsule by mouth daily.        . Coenzyme Q10 (CO Q-10 PO) 1 tab po qd       . Flaxseed, Linseed, (FLAXSEED OIL) OIL 2 teaspoons daily       . letrozole (FEMARA) 2.5 MG tablet Take 2.5 mg by mouth daily.        Marland Kitchen lisinopril (PRINIVIL,ZESTRIL) 10 MG tablet Take 10 mg by mouth 2 (two) times daily.        Marland Kitchen  LORazepam (ATIVAN) 1 MG tablet Take 1 mg by mouth 2 (two) times daily.        . methocarbamol (ROBAXIN) 750 MG tablet Take 750 mg by mouth daily.        . Multiple Vitamins-Minerals (CENTRUM SILVER ULTRA WOMENS PO) Take 1 tablet by mouth daily.        Marland Kitchen oxyCODONE-acetaminophen (PERCOCET) 5-325 MG per tablet 4 times daily      . pantoprazole (PROTONIX) 40 MG tablet Take 1 tablet by mouth Daily.      . potassium chloride SA (K-DUR,KLOR-CON) 20 MEQ tablet Take 20 mEq by mouth as needed. With fluid pill      . pregabalin (LYRICA) 200 MG capsule Take by mouth daily.       . RABEprazole (ACIPHEX) 20 MG tablet Take 20 mg by mouth as needed.        Marland Kitchen spironolactone (ALDACTONE) 25 MG tablet 25 mg. Take 1/2 tablet daily       . torsemide (DEMADEX) 20 MG tablet Take 20 mg by mouth as needed.        . traZODone (DESYREL) 100 MG tablet Take 50 mg by mouth as needed.       . zolpidem (AMBIEN) 10 MG tablet Take 10 mg by mouth daily.         Allergies: Allergies  Allergen Reactions    . Reglan (Metoclopramide Hcl)    Social history:  Nonsmoker  Family history:  CAD:  Mom had stents placed in her early 31s  ROS:  See history of present illness.  She denies fevers, chills, cough, melena, hematochezia.  All other systems reviewed and negative.  Vital Signs: BP 101/62  Pulse 70  Ht 5\' 7"  (1.702 m)  Wt 232 lb 6.4 oz (105.416 kg)  BMI 36.40 kg/m2  PHYSICAL EXAM: Well nourished, well developed, in no acute distress HEENT: normal Neck: no JVD Cardiac:  normal S1, S2; RRR; no murmur, No S3 Lungs:  clear to auscultation bilaterally, no wheezing, rhonchi or rales Abd: soft, nontender, no hepatomegaly Ext: no edema Skin: warm and dry Neuro:  CNs 2-12 intact, no focal abnormalities noted Psych: Normal affect  EKG:    NSR, HR 74, biphasic TWs in 2, 3, aVF, V6  ASSESSMENT AND PLAN:

## 2010-12-24 ENCOUNTER — Telehealth: Payer: Self-pay | Admitting: Physician Assistant

## 2010-12-24 ENCOUNTER — Other Ambulatory Visit: Payer: Medicare Other | Admitting: *Deleted

## 2010-12-24 ENCOUNTER — Telehealth: Payer: Self-pay | Admitting: *Deleted

## 2010-12-24 NOTE — Telephone Encounter (Signed)
Ok per SW to use herbalife. [name]

## 2010-12-24 NOTE — Telephone Encounter (Signed)
rtn call back to [facility]

## 2010-12-25 ENCOUNTER — Inpatient Hospital Stay (HOSPITAL_BASED_OUTPATIENT_CLINIC_OR_DEPARTMENT_OTHER)
Admission: RE | Admit: 2010-12-25 | Discharge: 2010-12-25 | Disposition: A | Discharge status: Home / Self Care | Payer: Medicare Other | Source: Ambulatory Visit | Attending: Internal Medicine | Admitting: Internal Medicine

## 2010-12-25 ENCOUNTER — Telehealth: Payer: Self-pay | Admitting: *Deleted

## 2010-12-25 DIAGNOSIS — I509 Heart failure, unspecified: Secondary | ICD-10-CM | POA: Insufficient documentation

## 2010-12-25 DIAGNOSIS — R079 Chest pain, unspecified: Secondary | ICD-10-CM | POA: Insufficient documentation

## 2010-12-25 DIAGNOSIS — I428 Other cardiomyopathies: Secondary | ICD-10-CM | POA: Insufficient documentation

## 2010-12-25 DIAGNOSIS — E669 Obesity, unspecified: Secondary | ICD-10-CM | POA: Insufficient documentation

## 2010-12-25 DIAGNOSIS — Z853 Personal history of malignant neoplasm of breast: Secondary | ICD-10-CM | POA: Insufficient documentation

## 2010-12-25 LAB — POCT I-STAT 3, ART BLOOD GAS (G3+)
Acid-Base Excess: 1 mmol/L (ref 0.0–2.0)
Bicarbonate: 25.8 mEq/L — ABNORMAL HIGH (ref 20.0–24.0)
O2 Saturation: 97 %
pO2, Arterial: 89 mmHg (ref 80.0–100.0)

## 2010-12-25 LAB — POCT I-STAT 3, VENOUS BLOOD GAS (G3P V)
Acid-base deficit: 3 mmol/L — ABNORMAL HIGH (ref 0.0–2.0)
O2 Saturation: 68 %
pCO2, Ven: 50.5 mmHg — ABNORMAL HIGH (ref 45.0–50.0)
pH, Ven: 7.346 — ABNORMAL HIGH (ref 7.250–7.300)
pO2, Ven: 38 mmHg (ref 30.0–45.0)

## 2010-12-25 NOTE — Telephone Encounter (Signed)
Pt aware as per [name], PA-C not to use any diet supps, such as herbalife or any other type of diet supp due to CHF. [name]

## 2011-01-07 ENCOUNTER — Encounter: Payer: Self-pay | Admitting: Physician Assistant

## 2011-01-11 ENCOUNTER — Encounter: Payer: Self-pay | Admitting: Physician Assistant

## 2011-01-11 ENCOUNTER — Ambulatory Visit (INDEPENDENT_AMBULATORY_CARE_PROVIDER_SITE_OTHER): Payer: Medicare Other | Admitting: Physician Assistant

## 2011-01-11 DIAGNOSIS — I5022 Chronic systolic (congestive) heart failure: Secondary | ICD-10-CM

## 2011-01-11 NOTE — Patient Instructions (Signed)
Your physician recommends that you schedule a follow-up appointment in: 02/08/11 11:00 AM WITH DR. Gala Romney AS PER SCOTT WEAVER, ,PA-C

## 2011-01-11 NOTE — Assessment & Plan Note (Signed)
Overall stable.  Continue current therapy.  Follow up with Dr. Gala Romney in 4 weeks in CHF clinic.  Consider repeat echo in several months to see if increase of coreg back to old dose effective.  Consider referral to EP for possible ICD if EF remains low.

## 2011-01-11 NOTE — Progress Notes (Signed)
History of Present Illness: Primary Cardiologist:  Dr. Arvilla Meres  [name] is a 40 y.o. female with a history of morbid obesity, depression, fibromyalgia, congestive heart failure secondary to nonischemic cardiomyopathy related to her chemotherapy for breast cancer. Last chem 2008. Still in remission.   CPX test Feb 2010. Peak VO2 was 14.5 which was 71% of predicted. When corrected for body weight the VO2 was 20.7. The slope was 27. RER 1.20. O2 pulse 73%. Overall this was felt to be only a very mild functional limitation due to her obesity and mild circulatory   Had echo 11/10 which showed EF back down to 25%. So patient underwent repeat echo, CPX and RHC for profound fatigue.  Echo 2/11 showed EF 50%  RHC: RA 4 PA 26/9 (16) PCWP 9 Fick CO 5.7/ 2.5  CPX 2/11: pVO2 13.0 (72%) correct for ideal wt 47ml/kg/min RER 1.06 (submax) slope 28.2 O2 pulse 91% - felt no signifcant cardiac limitation. + deconditioning.  Echo 1/12 EF 40%. So we did MUGA EF 58%.   BP low in recent past.  Weight down almost 40-45 pounds in past year.  CHF meds had been cut back (Coreg went from 18.75 to 12.5 BID and Demadex changed to PRN). I saw her a couple weeks ago for follow up after 2 trips to the ED for chest pain.  She was also noting DOE.  BNP was up some.  I increased her demadex and set up a a myoview.  Myoview done with mod anteroapical scar, no ischemia and EF now lower at 29%.  Anterior defect probably breast attenuation.  Follow up BNP was 58.  Echo done 7/12: EF 40-45%.  Cath was set up and demonstrated EF 20-25% and normal coronary arteries.  It was felt that her CHF meds should be adjusted again and to consider ICD if EF does not recover.  Her coreg was increased to 12.5 mg 1 in the AM and 1 1/2 in the PM.  She has increased this on her own to 12.5 mg 1 1/2 bid.  She denies syncope or near syncope.  No chest pain.  Still has some episodes of dyspnea.  No orthopnea or PND.  No edema.  She takes demadex  prn increased swelling.  No problems with her cath site.  Past Medical History  Diagnosis Date  . 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 Breast cancer     s/p mastectomy and chemotherapy, last on september 2008  . Peripheral neuropathy     chemo- induced  . Hypertension     c/b orthostatic hypotetion  . Depression   . Palpitation     normal sinus rythm only on 21 day heart monitor  . Cough   . Obesity   . Dizziness   . Fibromyalgia     Current Outpatient Prescriptions  Medication Sig Dispense Refill  . b complex vitamins tablet Take 1 tablet by mouth daily.        . Calcium-Vitamin D (CALTRATE 600 PLUS-VIT D PO) Take 1 tablet by mouth daily.        . carvedilol (COREG) 12.5 MG tablet 2 (two) times daily with a meal.       . Cholecalciferol (VITAMIN D3) 5000 UNITS CAPS Take 1 capsule by mouth daily.        . Coenzyme Q10 (CO Q-10 PO) 1 tab po qd       .  diclofenac (VOLTAREN) 75 MG EC tablet Take 75 mg by mouth daily.        . Flaxseed, Linseed, (FLAXSEED OIL) OIL 2 teaspoons daily       . letrozole (FEMARA) 2.5 MG tablet Take 2.5 mg by mouth daily.        Marland Kitchen lisinopril (PRINIVIL,ZESTRIL) 10 MG tablet Take 10 mg by mouth 2 (two) times daily.        Marland Kitchen LORazepam (ATIVAN) 1 MG tablet Take 1 mg by mouth 2 (two) times daily.        . methocarbamol (ROBAXIN) 750 MG tablet Take 750 mg by mouth daily.        . Multiple Vitamins-Minerals (CENTRUM SILVER ULTRA WOMENS PO) Take 1 tablet by mouth daily.        Marland Kitchen oxyCODONE-acetaminophen (PERCOCET) 5-325 MG per tablet 4 times daily      . pantoprazole (PROTONIX) 40 MG tablet Take 1 tablet by mouth Daily.      . potassium chloride SA (K-DUR,KLOR-CON) 20 MEQ tablet Take 20 mEq by mouth as needed. With fluid pill      . pregabalin (LYRICA) 200 MG capsule Take by mouth daily.       . Prenatal Multivit-Min-Fe-FA (PRENATAL VITAMINS) 0.8 MG tablet  Take 1 tablet by mouth daily.        Marland Kitchen spironolactone (ALDACTONE) 25 MG tablet 25 mg. Take 1/2 tablet daily       . torsemide (DEMADEX) 20 MG tablet Take 20 mg by mouth as needed.        . traZODone (DESYREL) 100 MG tablet Take 50 mg by mouth as needed.       . zolpidem (AMBIEN) 10 MG tablet Take 10 mg by mouth daily.         Allergies: Allergies  Allergen Reactions  . Reglan (Metoclopramide Hcl)    Social history:  Nonsmoker  Vital Signs: BP 92/60  Pulse 80  Ht 5\' 7"  (1.702 m)  Wt 228 lb (103.42 kg)  BMI 35.71 kg/m2  PHYSICAL EXAM: Well nourished, well developed, in no acute distress HEENT: normal Neck: no JVD Cardiac:  normal S1, S2; RRR; no murmur, No S3 Lungs:  clear to auscultation bilaterally, no wheezing, rhonchi or rales Abd: soft, nontender, no hepatomegaly Ext: no edema; RFA site without hematoma or bruit Skin: warm and dry Neuro:  CNs 2-12 intact, no focal abnormalities noted Psych: Normal affect  ASSESSMENT AND PLAN:

## 2011-01-21 ENCOUNTER — Other Ambulatory Visit: Payer: Self-pay | Admitting: Oncology

## 2011-01-21 ENCOUNTER — Encounter (HOSPITAL_BASED_OUTPATIENT_CLINIC_OR_DEPARTMENT_OTHER): Payer: Medicare Other | Admitting: Oncology

## 2011-01-21 DIAGNOSIS — I429 Cardiomyopathy, unspecified: Secondary | ICD-10-CM

## 2011-01-21 DIAGNOSIS — C50519 Malignant neoplasm of lower-outer quadrant of unspecified female breast: Secondary | ICD-10-CM

## 2011-01-21 DIAGNOSIS — R635 Abnormal weight gain: Secondary | ICD-10-CM

## 2011-01-21 DIAGNOSIS — Z170 Estrogen receptor positive status [ER+]: Secondary | ICD-10-CM

## 2011-01-21 LAB — CBC & DIFF AND RETIC
Eosinophils Absolute: 0.4 10*3/uL (ref 0.0–0.5)
HCT: 34.8 % (ref 34.8–46.6)
HGB: 11.2 g/dL — ABNORMAL LOW (ref 11.6–15.9)
LYMPH%: 36.2 % (ref 14.0–49.7)
MONO#: 0.4 10*3/uL (ref 0.1–0.9)
NEUT#: 3.3 10*3/uL (ref 1.5–6.5)
Platelets: 245 10*3/uL (ref 145–400)
RBC: 3.9 10*6/uL (ref 3.70–5.45)
Retic %: 2.27 % — ABNORMAL HIGH (ref 0.70–2.10)
WBC: 6.4 10*3/uL (ref 3.9–10.3)

## 2011-01-21 LAB — MORPHOLOGY: PLT EST: ADEQUATE

## 2011-01-25 LAB — COMPREHENSIVE METABOLIC PANEL
Albumin: 4.4 g/dL (ref 3.5–5.2)
Alkaline Phosphatase: 91 U/L (ref 39–117)
BUN: 6 mg/dL (ref 6–23)
CO2: 24 mEq/L (ref 19–32)
Glucose, Bld: 142 mg/dL — ABNORMAL HIGH (ref 70–99)
Potassium: 3.9 mEq/L (ref 3.5–5.3)
Total Protein: 7.5 g/dL (ref 6.0–8.3)

## 2011-01-25 LAB — PROTEIN ELECTROPHORESIS, SERUM
Albumin ELP: 54.8 % — ABNORMAL LOW (ref 55.8–66.1)
Alpha-1-Globulin: 4.6 % (ref 2.9–4.9)
Beta 2: 5.8 % (ref 3.2–6.5)
Beta Globulin: 5.6 % (ref 4.7–7.2)
Total Protein, Serum Electrophoresis: 7.5 g/dL (ref 6.0–8.3)

## 2011-01-25 LAB — LACTATE DEHYDROGENASE: LDH: 188 U/L (ref 94–250)

## 2011-01-25 LAB — CANCER ANTIGEN 27.29: CA 27.29: 25 U/mL (ref 0–39)

## 2011-01-28 ENCOUNTER — Encounter (HOSPITAL_BASED_OUTPATIENT_CLINIC_OR_DEPARTMENT_OTHER): Payer: Medicare Other | Admitting: Oncology

## 2011-01-28 ENCOUNTER — Other Ambulatory Visit: Payer: Self-pay | Admitting: Oncology

## 2011-01-28 DIAGNOSIS — Z170 Estrogen receptor positive status [ER+]: Secondary | ICD-10-CM

## 2011-01-28 DIAGNOSIS — Z853 Personal history of malignant neoplasm of breast: Secondary | ICD-10-CM

## 2011-01-28 DIAGNOSIS — C50919 Malignant neoplasm of unspecified site of unspecified female breast: Secondary | ICD-10-CM

## 2011-01-28 DIAGNOSIS — Z9889 Other specified postprocedural states: Secondary | ICD-10-CM

## 2011-02-08 ENCOUNTER — Ambulatory Visit (HOSPITAL_COMMUNITY): Payer: Medicare Other

## 2011-02-09 NOTE — Cardiovascular Report (Signed)
[name], GARMAN NO.:  [id number]  MEDICAL RECORD NO.:  [id number]  LOCATION:                                 FACILITY:  PHYSICIAN:  Bevelyn Buckles. Pang Robers, MDDATE OF BIRTH:  1971-02-23  DATE OF PROCEDURE:  12/25/2010 DATE OF DISCHARGE:                           CARDIAC CATHETERIZATION   PATIENT INDICATIONS:  Kiosha is a 40 year old woman with a history of obesity and congestive heart failure secondary to presumed nonischemic cardiomyopathy related to previous breast cancer treatment.  She has been struggling with NYHA class III heart failure symptoms.  Her EF has been very labile, initially recovered and now is back down.  She recently was having some chest pain and got Myoview with a question of anterior scar, however, this was thought to be breast attenuation. Given her chest pain and worsening EF as well as symptoms, we decided to proceed to right and left heart cath for definitive evaluation of her heart arteries and measurement of her filling pressures and EF.  PROCEDURES PERFORMED: 1. Right heart cath. 2. Selective coronary angiography. 3. Left heart cath. 4. Left ventriculogram.  DESCRIPTION OF PROCEDURE:  The risks and indication were explained. Consent was signed and placed on the chart.  Right groin area was prepped and draped in routine sterile fashion, anesthetized with 1% local lidocaine.  A 4-French arterial sheath was placed in the right femoral artery and 7-French venous sheath was placed in the right femoral vein.  The Swan-Ganz catheter was used for right heart cath.  The JR-4 was used to cannulate the right coronary artery.  We had significant difficulty cannulating the left coronary system effectively. We finally were able to do this with an AL-1 catheter.  We used an angled pigtail for the left heart cath.  All catheters were exchanged over wire.  There were no apparent complications.  Central aortic pressure 99/62 with a mean  of 79.  LV pressure 98/8 with an EDP of 8. RA pressure mean of 4, RV pressure of 35/11 with an EDP of 11, PA pressure 28/8 with a mean of 17.  Pulmonary capillary wedge mean of 8. Fick cardiac output 5.2.  Cardiac index 2.4.  Pulmonary vascular resistance was 1.7 Woods units.  Aortic saturation was 97%.  PA saturation was 67% and 68%.  Left main was normal.  LAD was a long vessel, gave off 2 diagonals.  It was angiographically normal.  Left circumflex gave off a ramus, 2 marginal branches normal.  Right coronary artery was a large dominant vessel, gave off a PDA and a PL, was angiographically normal.  Left ventriculogram done in the RAO position showed a severe global hypokinesis with an ejection fraction of 20%-25%.  ASSESSMENT: 1. Normal coronary arteries. 2. Severe left ventricular dysfunction with an ejection fraction of     20%-25%. 3. Normal filling pressures.  PLAN/DISCUSSION:  Kriss has normal coronary arteries with well- compensated filling pressures, however, ejection fraction has declined again.  We will try to re-titrate her medicines and I suspect she will need a defibrillator.  I will talk to her and family further about this.     Bevelyn Buckles. Sheelah Ritacco, MD  DRB/MEDQ  D:  12/25/2010  T:  12/26/2010  Job:  [id number]  Electronically Signed by Arvilla Meres MD on 02/09/2011 05:14:54 PM

## 2011-03-09 ENCOUNTER — Ambulatory Visit: Payer: Medicare Other | Admitting: Internal Medicine

## 2011-03-11 LAB — DIFFERENTIAL
Basophils Absolute: 0
Eosinophils Relative: 2
Monocytes Relative: 4
Neutrophils Relative %: 74

## 2011-03-11 LAB — BASIC METABOLIC PANEL
BUN: 10
Calcium: 9.1
Creatinine, Ser: 0.74
GFR calc non Af Amer: >60

## 2011-03-11 LAB — PROTIME-INR: Prothrombin Time: 12.5

## 2011-03-11 LAB — CBC
Platelets: 248
RDW: 14.1
WBC: 9.6

## 2011-03-16 LAB — DIFFERENTIAL
Basophils Relative: 1
Lymphocytes Relative: 31
Monocytes Relative: 9
Neutro Abs: 3
Neutrophils Relative %: 58

## 2011-03-16 LAB — URINALYSIS, ROUTINE W REFLEX MICROSCOPIC
Glucose, UA: NEGATIVE
Leukocytes, UA: NEGATIVE
Specific Gravity, Urine: 1.008
pH: 6

## 2011-03-16 LAB — CBC
Hemoglobin: 12.3
MCHC: 32.7
RBC: 4.06
WBC: 5.1

## 2011-03-16 LAB — BASIC METABOLIC PANEL
CO2: 27
Calcium: 8.9
Creatinine, Ser: 0.68
GFR calc Af Amer: >60
GFR calc non Af Amer: >60
Sodium: 138

## 2011-03-16 LAB — SEDIMENTATION RATE: Sed Rate: 5

## 2011-03-16 LAB — URINE MICROSCOPIC-ADD ON

## 2011-03-16 LAB — HEPATIC FUNCTION PANEL
ALT: 18
Albumin: 3.7
Total Protein: 6.9

## 2011-03-16 LAB — TSH: TSH: 1.068

## 2011-03-23 LAB — CBC
HCT: 29.5 — ABNORMAL LOW
HCT: 29.6 — ABNORMAL LOW
Hemoglobin: 10 — ABNORMAL LOW
Hemoglobin: 10.2 — ABNORMAL LOW
MCHC: 33.9
MCHC: 34
MCHC: 34.3
MCHC: 33.4
MCV: 91.8
MCV: 92.6
Platelets: 189
Platelets: 210
RBC: 3.33 — ABNORMAL LOW
RBC: 3.41 — ABNORMAL LOW
RBC: 4.1
RDW: 13.6
RDW: 13.7
RDW: 14.1
RDW: 14.3 — ABNORMAL HIGH
WBC: 7.5

## 2011-03-23 LAB — DIFFERENTIAL
Basophils Absolute: 0
Basophils Relative: 0
Eosinophils Absolute: 0.3
Eosinophils Absolute: 0.6
Eosinophils Relative: 4
Eosinophils Relative: 19 — ABNORMAL HIGH
Lymphocytes Relative: 17
Lymphocytes Relative: 20
Lymphs Abs: 0.7
Lymphs Abs: 1
Monocytes Absolute: 0.7
Monocytes Relative: 12
Monocytes Relative: 10
Neutrophils Relative %: 55
Neutrophils Relative %: 51

## 2011-03-23 LAB — BASIC METABOLIC PANEL
BUN: 5 — ABNORMAL LOW
BUN: 8
BUN: 9
CO2: 27
CO2: 27
CO2: 28
Calcium: 8.1 — ABNORMAL LOW
Calcium: 8.2 — ABNORMAL LOW
Calcium: 8.8
Chloride: 104
Chloride: 106
Creatinine, Ser: 0.66
Creatinine, Ser: 0.79
Creatinine, Ser: 0.77
GFR calc Af Amer: >60
GFR calc Af Amer: >60
GFR calc non Af Amer: >60
GFR calc non Af Amer: >60
Glucose, Bld: 102 — ABNORMAL HIGH
Glucose, Bld: 128 — ABNORMAL HIGH
Glucose, Bld: 133 — ABNORMAL HIGH
Potassium: 3.2 — ABNORMAL LOW
Potassium: 3.6
Sodium: 140
Sodium: 140

## 2011-03-23 LAB — COMPREHENSIVE METABOLIC PANEL
ALT: 23
AST: 24
AST: 28
Albumin: 3.2 — ABNORMAL LOW
Alkaline Phosphatase: 45
CO2: 24
Calcium: 8.9
Chloride: 106
Creatinine, Ser: 0.74
GFR calc Af Amer: >60
GFR calc Af Amer: >60
GFR calc non Af Amer: >60
Potassium: 3.1 — ABNORMAL LOW
Total Bilirubin: 0.8
Total Protein: 6.8

## 2011-03-23 LAB — POCT CARDIAC MARKERS
CKMB, poc: <1 — ABNORMAL LOW
Myoglobin, poc: 43.5
Troponin i, poc: <0.05

## 2011-03-23 LAB — CULTURE, RESPIRATORY W GRAM STAIN

## 2011-03-23 LAB — URINALYSIS, ROUTINE W REFLEX MICROSCOPIC
Ketones, ur: NEGATIVE
Leukocytes, UA: NEGATIVE
Nitrite: NEGATIVE
Protein, ur: NEGATIVE
Urobilinogen, UA: 1
pH: 6

## 2011-03-23 LAB — STREP A DNA PROBE: Group A Strep Probe: NEGATIVE

## 2011-03-23 LAB — PROTIME-INR
INR: 1
Prothrombin Time: 13

## 2011-03-23 LAB — EXPECTORATED SPUTUM ASSESSMENT W GRAM STAIN, RFLX TO RESP C

## 2011-03-23 LAB — CULTURE, BLOOD (ROUTINE X 2): Culture: NO GROWTH

## 2011-03-23 LAB — B-NATRIURETIC PEPTIDE (CONVERTED LAB)
Pro B Natriuretic peptide (BNP): 240 — ABNORMAL HIGH
Pro B Natriuretic peptide (BNP): 245 — ABNORMAL HIGH

## 2011-03-24 DIAGNOSIS — E119 Type 2 diabetes mellitus without complications: Secondary | ICD-10-CM | POA: Insufficient documentation

## 2011-03-25 ENCOUNTER — Ambulatory Visit (HOSPITAL_COMMUNITY)
Admission: RE | Admit: 2011-03-25 | Discharge: 2011-03-25 | Disposition: A | Discharge status: Home / Self Care | Payer: Medicare Other | Source: Ambulatory Visit | Attending: Internal Medicine | Admitting: Internal Medicine

## 2011-03-25 VITALS — BP 86/60 | HR 93 | Wt 228.0 lb

## 2011-03-25 DIAGNOSIS — I5022 Chronic systolic (congestive) heart failure: Secondary | ICD-10-CM | POA: Insufficient documentation

## 2011-03-25 NOTE — Progress Notes (Signed)
History of Present Illness: Primary Cardiologist:  Dr. Arvilla Meres  [name] is a 40 y.o. female with a history of morbid obesity, depression, fibromyalgia, congestive heart failure secondary to nonischemic cardiomyopathy related to her chemotherapy for breast cancer. Last chem 2008. Still in remission.   CPX test Feb 2010. Peak VO2 was 14.5 which was 71% of predicted. When corrected for body weight the VO2 was 20.7. The slope was 27. RER 1.20. O2 pulse 73%. Overall this was felt to be only a very mild functional limitation due to her obesity and mild circulatory   Had echo 11/10 which showed EF back down to 25%. So patient underwent repeat echo, CPX and RHC for profound fatigue.  Echo 2/11 showed EF 50%  RHC: RA 4 PA 26/9 (16) PCWP 9 Fick CO 5.7/ 2.5  CPX 2/11: pVO2 13.0 (72%) correct for ideal wt 33ml/kg/min RER 1.06 (submax) slope 28.2 O2 pulse 91% - felt no signifcant cardiac limitation. + deconditioning.  Echo 1/12 EF 40%. So we did MUGA EF 58%.   After several episodes of  I saw her a couple weeks ago for follow up after 2 trips to the ED for chest pain.  She was also noting DOE.  BNP was up some.  I increased her demadex and set up a a myoview.  Myoview done with mod anteroapical scar, no ischemia and EF now lower at 29%.  Anterior defect probably breast attenuation.  Follow up BNP was 58.  Echo done 7/12: EF 40-45%.  Cath (12/28/10) was set up and demonstrated EF 20-25% and normal coronary arteries.   She returns for follow up today c/o 2 days intermittent SOB, weakness, no energy, and palpitations.  Chronic lightheadedness.  Increased numbness in fingers and toes.  Weight has gone up 5-7 lb multiple times requiring prn Demadex use.    Past Medical History  Diagnosis Date  . 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 Breast cancer     s/p mastectomy and chemotherapy, last on  september 2008  . Peripheral neuropathy     chemo- induced  . Hypertension     c/b orthostatic hypotetion  . Depression   . Palpitation     normal sinus rythm only on 21 day heart monitor  . Cough   . Obesity   . Dizziness   . Fibromyalgia     Current Outpatient Prescriptions  Medication Sig Dispense Refill  . b complex vitamins tablet Take 1 tablet by mouth daily.        . Calcium-Vitamin D (CALTRATE 600 PLUS-VIT D PO) Take 1 tablet by mouth daily.        . carvedilol (COREG) 12.5 MG tablet 18.75 mg 2 (two) times daily with a meal.       . Cholecalciferol (VITAMIN D3) 5000 UNITS CAPS Take 1 capsule by mouth daily.        . Coenzyme Q10 (CO Q-10 PO) 1 tab po qd       . Flaxseed, Linseed, (FLAXSEED OIL) OIL 2 teaspoons daily       . letrozole (FEMARA) 2.5 MG tablet Take 2.5 mg by mouth daily.        Marland Kitchen lisinopril (PRINIVIL,ZESTRIL) 10 MG tablet Take 10 mg by mouth 2 (two) times daily.        Marland Kitchen LORazepam (ATIVAN) 1 MG tablet Take 1 mg by mouth 2 (two)  times daily.        . methocarbamol (ROBAXIN) 750 MG tablet Take 750 mg by mouth daily.        . Multiple Vitamins-Minerals (CENTRUM SILVER ULTRA WOMENS PO) Take 1 tablet by mouth daily.        Marland Kitchen oxyCODONE-acetaminophen (PERCOCET) 5-325 MG per tablet Take 1 tablet by mouth 4 (four) times daily. 4 times daily      . potassium chloride SA (K-DUR,KLOR-CON) 20 MEQ tablet Take 20 mEq by mouth as needed. With fluid pill      . pregabalin (LYRICA) 200 MG capsule Take by mouth daily.       . Prenatal Multivit-Min-Fe-FA (PRENATAL VITAMINS) 0.8 MG tablet Take 1 tablet by mouth daily.        . RABEprazole (ACIPHEX) 20 MG tablet Take 20 mg by mouth daily.        Marland Kitchen spironolactone (ALDACTONE) 25 MG tablet 25 mg. Take 1/2 tablet daily       . torsemide (DEMADEX) 20 MG tablet Take 20 mg by mouth as needed.        . traZODone (DESYREL) 100 MG tablet Take 50 mg by mouth as needed.       . zolpidem (AMBIEN) 10 MG tablet Take 10 mg by mouth daily.          Allergies: Allergies  Allergen Reactions  . Reglan (Metoclopramide Hcl)    Social history:  Nonsmoker  Vital Signs: BP 86/60  Pulse 93  Wt 228 lb (103.42 kg)  SpO2 96%  PHYSICAL EXAM: Well nourished, well developed, in no acute distress HEENT: normal Neck: JVD flat Cardiac:  normal S1, S2; RRR; no murmur, No S3 Lungs:  clear to auscultation bilaterally, no wheezing, rhonchi or rales Abd: soft, nontender, no hepatomegaly Ext: no edema; RFA site without hematoma or bruit Skin: warm and dry Neuro:  CNs 2-12 intact, no focal abnormalities noted Psych: Normal affect  ASSESSMENT AND PLAN:

## 2011-03-25 NOTE — Patient Instructions (Signed)
Labs today  Your physician has requested that you have an echocardiogram. Echocardiography is a painless test that uses sound waves to create images of your heart. It provides your doctor with information about the size and shape of your heart and how well your heart's chambers and valves are working. This procedure takes approximately one hour. There are no restrictions for this procedure.  Your physician recommends that you schedule a follow-up appointment in: 3 weeks  Please weight and record yourself daily, at the same time, we would like your weight to be 225 lb.  Hold off on taking extra Demadex unless weight is 230 lb or more.  If dizziness or lightheadedness continues decrease morning Carvedilol (Coreg) to 1 tablet.

## 2011-03-25 NOTE — Assessment & Plan Note (Addendum)
Fluid status is stable, may even be a little on the dry side.  NYHA I.  Asked her to hold demadex over next several days because Feel her dry weight should be around 225 (222 at home today).  Can continue to prn demadex.  Will continue coreg 18.75 BID unless dizziness continues we have discussed decreasing am dose of coreg to 12.5.  She voiced understanding although she is not happy about cutting back her beta blocker.  Check BMET today.      Patient seen and examined with Krystal Blossom PA-C. We discussed all aspects of the encounter. I agree with the assessment and plan as stated above.

## 2011-03-26 ENCOUNTER — Ambulatory Visit (INDEPENDENT_AMBULATORY_CARE_PROVIDER_SITE_OTHER): Payer: Medicare Other | Admitting: *Deleted

## 2011-03-26 DIAGNOSIS — R209 Unspecified disturbances of skin sensation: Secondary | ICD-10-CM

## 2011-03-26 DIAGNOSIS — R0602 Shortness of breath: Secondary | ICD-10-CM

## 2011-03-26 DIAGNOSIS — I951 Orthostatic hypotension: Secondary | ICD-10-CM

## 2011-03-26 DIAGNOSIS — R5381 Other malaise: Secondary | ICD-10-CM

## 2011-03-26 DIAGNOSIS — R002 Palpitations: Secondary | ICD-10-CM

## 2011-03-26 DIAGNOSIS — R42 Dizziness and giddiness: Secondary | ICD-10-CM

## 2011-03-26 DIAGNOSIS — R262 Difficulty in walking, not elsewhere classified: Secondary | ICD-10-CM

## 2011-03-26 DIAGNOSIS — R609 Edema, unspecified: Secondary | ICD-10-CM

## 2011-03-26 DIAGNOSIS — R635 Abnormal weight gain: Secondary | ICD-10-CM

## 2011-03-26 DIAGNOSIS — I959 Hypotension, unspecified: Secondary | ICD-10-CM

## 2011-03-26 DIAGNOSIS — I509 Heart failure, unspecified: Secondary | ICD-10-CM

## 2011-03-26 DIAGNOSIS — I10 Essential (primary) hypertension: Secondary | ICD-10-CM

## 2011-03-26 DIAGNOSIS — E663 Overweight: Secondary | ICD-10-CM

## 2011-03-26 DIAGNOSIS — R0601 Orthopnea: Secondary | ICD-10-CM

## 2011-03-26 DIAGNOSIS — R5383 Other fatigue: Secondary | ICD-10-CM

## 2011-03-26 DIAGNOSIS — I5023 Acute on chronic systolic (congestive) heart failure: Secondary | ICD-10-CM

## 2011-03-26 DIAGNOSIS — I429 Cardiomyopathy, unspecified: Secondary | ICD-10-CM

## 2011-03-26 DIAGNOSIS — I5022 Chronic systolic (congestive) heart failure: Secondary | ICD-10-CM

## 2011-03-26 LAB — BASIC METABOLIC PANEL
BUN: 28 mg/dL — ABNORMAL HIGH (ref 6–23)
Calcium: 9.2 mg/dL (ref 8.4–10.5)
Creatinine, Ser: 1.2 mg/dL (ref 0.4–1.2)
GFR: 63.15 mL/min (ref >60.00)

## 2011-04-11 NOTE — Progress Notes (Signed)
Patient seen and examined with Nicki Bradley PA-C. We discussed all aspects of the encounter. I agree with the assessment and plan as stated above.   

## 2011-04-11 NOTE — Progress Notes (Signed)
Pt cancelled appt

## 2011-04-15 ENCOUNTER — Encounter (HOSPITAL_COMMUNITY): Payer: Medicare Other

## 2011-04-15 ENCOUNTER — Ambulatory Visit (HOSPITAL_COMMUNITY): Payer: Medicare Other

## 2011-05-08 ENCOUNTER — Other Ambulatory Visit: Payer: Self-pay | Admitting: Oncology

## 2011-05-11 ENCOUNTER — Encounter (HOSPITAL_COMMUNITY): Payer: Medicare Other

## 2011-05-11 ENCOUNTER — Other Ambulatory Visit (HOSPITAL_COMMUNITY): Payer: Medicare Other

## 2011-05-13 ENCOUNTER — Other Ambulatory Visit (HOSPITAL_COMMUNITY): Payer: Self-pay | Admitting: *Deleted

## 2011-05-13 MED ORDER — CARVEDILOL 12.5 MG PO TABS: 18.7500 mg | ORAL_TABLET | Freq: Two times a day (BID) | ORAL | Status: DC

## 2011-05-13 NOTE — Telephone Encounter (Signed)
Pt called and stated she was at pharmacy and needed refill on carvedilol but the pharmacist told her it had been rejected, apologized for that and called rx into her pharmacy

## 2011-05-18 ENCOUNTER — Ambulatory Visit (HOSPITAL_COMMUNITY): Payer: Medicare Other

## 2011-06-01 ENCOUNTER — Ambulatory Visit (HOSPITAL_COMMUNITY)
Admission: RE | Admit: 2011-06-01 | Discharge: 2011-06-01 | Disposition: A | Discharge status: Home / Self Care | Payer: Medicare Other | Source: Ambulatory Visit | Attending: Internal Medicine | Admitting: Internal Medicine

## 2011-06-01 VITALS — BP 88/50 | HR 85 | Wt 226.0 lb

## 2011-06-01 DIAGNOSIS — I429 Cardiomyopathy, unspecified: Secondary | ICD-10-CM

## 2011-06-01 DIAGNOSIS — I5022 Chronic systolic (congestive) heart failure: Secondary | ICD-10-CM | POA: Insufficient documentation

## 2011-06-01 DIAGNOSIS — I509 Heart failure, unspecified: Secondary | ICD-10-CM

## 2011-06-01 DIAGNOSIS — I5023 Acute on chronic systolic (congestive) heart failure: Secondary | ICD-10-CM

## 2011-06-01 DIAGNOSIS — I059 Rheumatic mitral valve disease, unspecified: Secondary | ICD-10-CM

## 2011-06-01 NOTE — Progress Notes (Signed)
  Echocardiogram 2D Echocardiogram has been performed.  [name] 06/01/2011, 10:29 AM

## 2011-06-01 NOTE — Progress Notes (Signed)
Encounter addended by: Noralee Space, RN on: 06/01/2011  4:23 PM<BR>     Documentation filed: Orders

## 2011-06-01 NOTE — Progress Notes (Signed)
Patient ID: [name], female   DOB: [birth year], 40 y.o.   MRN: [id number] History of Present Illness: Primary Cardiologist:  Dr. Arvilla Meres  [name] is a 40 y.o. female with a history of morbid obesity, depression, fibromyalgia, congestive heart failure secondary to nonischemic cardiomyopathy related to her chemotherapy for breast cancer. Last chem 2008. Still in remission.   CPX test Feb 2010. Peak VO2 was 14.5 which was 71% of predicted. When corrected for body weight the VO2 was 20.7. The slope was 27. RER 1.20. O2 pulse 73%. Overall this was felt to be only a very mild functional limitation due to her obesity and mild circulatory   CPX 2/11: pVO2 13.0 (72%) correct for ideal wt 48ml/kg/min RER 1.06 (submax) slope 28.2 O2 pulse 91% - felt no signifcant cardiac limitation. + deconditioning.   Had echo 11/10  EF back down to 25%. So patient underwent repeat echo, CPX and RHC for profound fatigue. CPX as above. RHC normal. Echo 2/11 showed EF 50%  Echo 1/12 EF 40%. So we did MUGA EF 58%.   I saw her a couple weeks ago for follow up after 2 trips to the ED for chest pain.  She was also noting DOE.  BNP was up some.  I increased her demadex and set up a a myoview.  Myoview done with mod anteroapical scar, no ischemia and EF now lower at 29%.  Anterior defect probably breast attenuation.  Follow up BNP was 58.    Echo done 7/12: EF 40-45%.   Cath (12/28/10) was set up and demonstrated EF 20-25% and normal coronary arteries.  RA  4, RV 35/11, PA  28/8 (17), PCWP 8. Fick 5.2/2.4 PVR 1.7 Woods. Fick 5.2/2.4 Aortic saturation was 97%.  PA  saturation was 67% and 68%.   She returns for follow up today. Complains of fatiuge. SOB on exertion. Can walk through whole grocery store slowly but fatigued afterward. Denies PND/Orthopnea. Denies dizziness. She continues to complain of neuropathy in her feet and hand. Takes demadex  for weight is up 5 pounds. She is taking demadex  about twice a  week.  Coreg 18.75 bid and lisinopril 10 bid.   Echo today which I reviewed 25-30%   Past Medical History  Diagnosis Date  . 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 Breast cancer     s/p mastectomy and chemotherapy, last on september 2008  . Peripheral neuropathy     chemo- induced  . Hypertension     c/b orthostatic hypotetion  . Depression   . Palpitation     normal sinus rythm only on 21 day heart monitor  . Cough   . Obesity   . Dizziness   . Fibromyalgia     Current Outpatient Prescriptions  Medication Sig Dispense Refill  . b complex vitamins tablet Take 1 tablet by mouth daily.        . Calcium-Vitamin D (CALTRATE 600 PLUS-VIT D PO) Take 1 tablet by mouth daily.        . carvedilol (COREG) 12.5 MG tablet Take 1.5 tablets (18.75 mg total) by mouth 2 (two) times daily with a meal.  90 tablet  6  . Cholecalciferol (VITAMIN D3) 5000 UNITS CAPS Take 1 capsule by mouth daily.        . Coenzyme Q10 (CO Q-10 PO) 1 tab po qd       .  Flaxseed, Linseed, (FLAXSEED OIL) OIL 2 teaspoons daily       . letrozole (FEMARA) 2.5 MG tablet TAKE 1 TABLET DAILY  30 tablet  11  . lisinopril (PRINIVIL,ZESTRIL) 10 MG tablet Take 10 mg by mouth 2 (two) times daily.        Marland Kitchen LORazepam (ATIVAN) 1 MG tablet Take 1 mg by mouth 2 (two) times daily.        . methocarbamol (ROBAXIN) 750 MG tablet Take 750 mg by mouth daily.        . Multiple Vitamins-Minerals (CENTRUM SILVER ULTRA WOMENS PO) Take 1 tablet by mouth daily.        Marland Kitchen oxyCODONE-acetaminophen (PERCOCET) 5-325 MG per tablet Take 1 tablet by mouth 4 (four) times daily. 4 times daily      . potassium chloride SA (K-DUR,KLOR-CON) 20 MEQ tablet Take 20 mEq by mouth as needed. With fluid pill      . pregabalin (LYRICA) 200 MG capsule Take by mouth daily.       . Prenatal Multivit-Min-Fe-FA (PRENATAL VITAMINS) 0.8 MG tablet Take 1 tablet by mouth  daily.        . RABEprazole (ACIPHEX) 20 MG tablet Take 20 mg by mouth daily.        Marland Kitchen spironolactone (ALDACTONE) 25 MG tablet 25 mg. Take 1/2 tablet daily       . torsemide (DEMADEX) 20 MG tablet Take 20 mg by mouth as needed.        . traZODone (DESYREL) 100 MG tablet Take 50 mg by mouth as needed.       . zolpidem (AMBIEN) 10 MG tablet Take 10 mg by mouth daily.         Allergies: Allergies  Allergen Reactions  . Reglan (Metoclopramide Hcl)    Social history:  Nonsmoker  Vital Signs: BP 88/50  Pulse 85  Wt 226 lb (102.513 kg)  SpO2 98% Weight 228 pounds PHYSICAL EXAM: Well nourished, well developed, in no acute distress HEENT: normal Neck: JVD flat Cardiac:  normal S1, S2; RRR; no murmur, No S3 Lungs:  clear to auscultation bilaterally, no wheezing, rhonchi or rales Abd: soft, nontender, no hepatomegaly Ext: no edema; RFA site without hematoma or bruit Skin: warm and dry Neuro:  CNs 2-12 intact, no focal abnormalities noted Psych: Normal affect  ASSESSMENT AND PLAN:

## 2011-06-01 NOTE — Assessment & Plan Note (Signed)
She is NYHA II-III. Volume status stable with sliding scale diuretic. Echo reviewed with her and her mother today in clinic and EF back down a bit to 25-30%. BP too low to titrate meds further. Will proceed with cardiac MRI to look for infiltrative process and to objectively quantify EF. Had long talk about probable risk of SCD and need for ICD. May also need repeat CPX test at some point.   Total MD time spent = 40 mins with over 70% of that time dedicated to counseling and discussions described above.

## 2011-06-01 NOTE — Patient Instructions (Signed)
Your physician has requested that you have a cardiac MRI. Cardiac MRI uses a computer to create images of your heart as its beating, producing both still and moving pictures of your heart and major blood vessels. For further information please visit [email or web address]. Please follow the instruction sheet given to you today for more information.  Your physician recommends that you schedule a follow-up appointment in: 1 month  

## 2011-06-14 ENCOUNTER — Telehealth (HOSPITAL_COMMUNITY): Payer: Self-pay | Admitting: *Deleted

## 2011-06-14 ENCOUNTER — Other Ambulatory Visit: Payer: Self-pay | Admitting: Internal Medicine

## 2011-06-14 NOTE — Telephone Encounter (Signed)
[name] called this am.  She was concerned about the cardiac MRI that has not been scheduled yet, and also is not feeling so hot, fatigued and weak.  She would like a call back.  Thanks

## 2011-06-14 NOTE — Telephone Encounter (Signed)
Over last week she c/o not feeling well with increased fatigue/weakness.  Several pains in heart lasting several seconds.  No fever/chills.  Dyspnea increased 2 days ago with weight up 6 lbs.  Took diuretic and breathing improved, weight down 3 lbs.  BP running 119/78, which is higher than normal.  Pulse 100.  No f/c.    Discussed the above with Dr. Gala Romney and no change to medication at this time.  May need a CPX in the future if weakness/fatigue continue.  She appreciated the call back.   Concerning the cardiac MRI, discussed with Geisla at [facility] and she is currently waiting to hear back from Crystal in the MRI department for a time/date.  They will call her.

## 2011-06-16 ENCOUNTER — Encounter: Payer: Self-pay | Admitting: Internal Medicine

## 2011-06-18 ENCOUNTER — Other Ambulatory Visit: Payer: Self-pay | Admitting: Internal Medicine

## 2011-06-22 DIAGNOSIS — M722 Plantar fascial fibromatosis: Secondary | ICD-10-CM | POA: Diagnosis not present

## 2011-06-28 ENCOUNTER — Telehealth (HOSPITAL_COMMUNITY): Payer: Self-pay | Admitting: *Deleted

## 2011-06-28 ENCOUNTER — Other Ambulatory Visit: Payer: Self-pay | Admitting: Physician Assistant

## 2011-06-28 DIAGNOSIS — I5022 Chronic systolic (congestive) heart failure: Secondary | ICD-10-CM

## 2011-06-28 NOTE — Telephone Encounter (Signed)
Pt needs bmet before having MRI, order placed

## 2011-06-29 ENCOUNTER — Inpatient Hospital Stay (HOSPITAL_COMMUNITY): Admission: RE | Admit: 2011-06-29 | Payer: Medicare Other | Source: Ambulatory Visit

## 2011-07-02 ENCOUNTER — Inpatient Hospital Stay (HOSPITAL_COMMUNITY): Admission: RE | Admit: 2011-07-02 | Payer: Medicare Other | Source: Ambulatory Visit

## 2011-07-03 ENCOUNTER — Telehealth: Payer: Self-pay | Admitting: Oncology

## 2011-07-03 NOTE — Telephone Encounter (Signed)
called pt and schedule appt for her march2013.  d/t per pt

## 2011-07-05 ENCOUNTER — Ambulatory Visit (HOSPITAL_COMMUNITY): Payer: Medicare Other

## 2011-07-07 ENCOUNTER — Inpatient Hospital Stay (HOSPITAL_COMMUNITY): Admission: RE | Admit: 2011-07-07 | Payer: Medicare Other | Source: Ambulatory Visit

## 2011-07-12 ENCOUNTER — Encounter (HOSPITAL_COMMUNITY): Payer: Self-pay | Admitting: Emergency Medicine

## 2011-07-12 ENCOUNTER — Emergency Department (HOSPITAL_COMMUNITY): Payer: Medicare Other

## 2011-07-12 ENCOUNTER — Emergency Department (HOSPITAL_COMMUNITY)
Admission: EM | Admit: 2011-07-12 | Discharge: 2011-07-12 | Disposition: A | Discharge status: Home / Self Care | Payer: Medicare Other | Attending: Emergency Medicine | Admitting: Emergency Medicine

## 2011-07-12 ENCOUNTER — Other Ambulatory Visit: Payer: Self-pay

## 2011-07-12 DIAGNOSIS — R079 Chest pain, unspecified: Secondary | ICD-10-CM | POA: Diagnosis not present

## 2011-07-12 DIAGNOSIS — F3289 Other specified depressive episodes: Secondary | ICD-10-CM | POA: Insufficient documentation

## 2011-07-12 DIAGNOSIS — Z7982 Long term (current) use of aspirin: Secondary | ICD-10-CM | POA: Diagnosis not present

## 2011-07-12 DIAGNOSIS — R42 Dizziness and giddiness: Secondary | ICD-10-CM | POA: Insufficient documentation

## 2011-07-12 DIAGNOSIS — R0609 Other forms of dyspnea: Secondary | ICD-10-CM | POA: Insufficient documentation

## 2011-07-12 DIAGNOSIS — J3489 Other specified disorders of nose and nasal sinuses: Secondary | ICD-10-CM | POA: Insufficient documentation

## 2011-07-12 DIAGNOSIS — Z79899 Other long term (current) drug therapy: Secondary | ICD-10-CM | POA: Diagnosis not present

## 2011-07-12 DIAGNOSIS — R0602 Shortness of breath: Secondary | ICD-10-CM | POA: Diagnosis not present

## 2011-07-12 DIAGNOSIS — I10 Essential (primary) hypertension: Secondary | ICD-10-CM | POA: Insufficient documentation

## 2011-07-12 DIAGNOSIS — M542 Cervicalgia: Secondary | ICD-10-CM | POA: Insufficient documentation

## 2011-07-12 DIAGNOSIS — Z853 Personal history of malignant neoplasm of breast: Secondary | ICD-10-CM | POA: Insufficient documentation

## 2011-07-12 DIAGNOSIS — R5381 Other malaise: Secondary | ICD-10-CM | POA: Diagnosis not present

## 2011-07-12 DIAGNOSIS — R05 Cough: Secondary | ICD-10-CM | POA: Insufficient documentation

## 2011-07-12 DIAGNOSIS — R0789 Other chest pain: Secondary | ICD-10-CM | POA: Diagnosis not present

## 2011-07-12 DIAGNOSIS — R059 Cough, unspecified: Secondary | ICD-10-CM | POA: Diagnosis not present

## 2011-07-12 DIAGNOSIS — F329 Major depressive disorder, single episode, unspecified: Secondary | ICD-10-CM | POA: Diagnosis not present

## 2011-07-12 DIAGNOSIS — R5383 Other fatigue: Secondary | ICD-10-CM | POA: Diagnosis not present

## 2011-07-12 DIAGNOSIS — I509 Heart failure, unspecified: Secondary | ICD-10-CM | POA: Diagnosis not present

## 2011-07-12 DIAGNOSIS — R0989 Other specified symptoms and signs involving the circulatory and respiratory systems: Secondary | ICD-10-CM | POA: Insufficient documentation

## 2011-07-12 LAB — BASIC METABOLIC PANEL
BUN: 18 mg/dL (ref 6–23)
Chloride: 96 mEq/L (ref 96–112)
GFR calc Af Amer: 68 mL/min — ABNORMAL LOW (ref >90)
Potassium: 3.7 mEq/L (ref 3.5–5.1)

## 2011-07-12 LAB — CBC
MCV: 87.3 fL (ref 78.0–100.0)
Platelets: 283 10*3/uL (ref 150–400)
RBC: 4.4 MIL/uL (ref 3.87–5.11)
RDW: 13.9 % (ref 11.5–15.5)
WBC: 10.7 10*3/uL — ABNORMAL HIGH (ref 4.0–10.5)

## 2011-07-12 LAB — DIFFERENTIAL
Basophils Absolute: 0.1 10*3/uL (ref 0.0–0.1)
Eosinophils Relative: 1 % (ref 0–5)
Lymphocytes Relative: 31 % (ref 12–46)
Lymphs Abs: 3.3 10*3/uL (ref 0.7–4.0)
Neutrophils Relative %: 63 % (ref 43–77)

## 2011-07-12 LAB — POCT I-STAT TROPONIN I: Troponin i, poc: 0 ng/mL (ref 0.00–0.08)

## 2011-07-12 LAB — PRO B NATRIURETIC PEPTIDE: Pro B Natriuretic peptide (BNP): 89.7 pg/mL (ref 0–125)

## 2011-07-12 MED ORDER — ASPIRIN 81 MG PO CHEW
324.0000 mg | CHEWABLE_TABLET | Freq: Once | ORAL | Status: AC
  Administered 2011-07-12: 324 mg via ORAL
  Filled 2011-07-12: qty 4

## 2011-07-12 NOTE — ED Notes (Signed)
PT. REPORTS SUBSTERNAL CHEST PAIN ONSET TODAY WITH TIGHTNESS AND PRESSURE RADIATING TO JAW , SLIGHT SOB AND PRODUCTIVE COUGH . DENIES NAUSEA OR DIAPHORESIS.

## 2011-07-12 NOTE — ED Provider Notes (Signed)
History     CSN: [id number]  Arrival date & time 07/12/11  0100   First MD Initiated Contact with Patient 07/12/11 0146      Chief Complaint  Patient presents with  . Chest Pain     Patient is a 41 y.o. female presenting with chest pain. The history is provided by the patient.  Chest Pain The chest pain began yesterday. Chest pain occurs intermittently. The chest pain is unchanged. Associated with: nothing. The severity of the pain is moderate. The quality of the pain is described as pressure-like. The pain radiates to the left neck. Exacerbated by: nothing. Primary symptoms include fatigue, shortness of breath, cough and dizziness. Pertinent negatives for primary symptoms include no fever and no vomiting.  Dizziness does not occur with vomiting or diaphoresis.  Pertinent negatives for associated symptoms include no diaphoresis.   pt reports fatigue for several weeks, also reports recent cough/congestion, she also reports dyspnea on exertion that has been increasing over past week Reports this past evening she noticed chest pressure at times, intermittently No pleuritic type pain No syncope reported No vomiting/diarrhea reported  Past Medical History  Diagnosis Date  . 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 Breast cancer     s/p mastectomy and chemotherapy, last on september 2008  . Peripheral neuropathy     chemo- induced  . Hypertension     c/b orthostatic hypotetion  . Depression   . Palpitation     normal sinus rythm only on 21 day heart monitor  . Cough   . Obesity   . Dizziness   . Fibromyalgia     Past Surgical History  Procedure Date  . Mastectomy   . Abdominal hysterectomy   . Laparoscopic endometriosis fulguration     Family History  Problem Relation Age of Onset  . Coronary artery disease    . Heart attack      History  Substance Use Topics    . Smoking status: Never Smoker   . Smokeless tobacco: Not on file  . Alcohol Use: No    OB History    Grav Para Term Preterm Abortions TAB SAB Ect Mult Living                  Review of Systems  Constitutional: Positive for fatigue. Negative for fever and diaphoresis.  Respiratory: Positive for cough and shortness of breath.   Cardiovascular: Positive for chest pain.  Gastrointestinal: Negative for vomiting.  Neurological: Positive for dizziness.  All other systems reviewed and are negative.    Allergies  Reglan  Home Medications   Current Outpatient Rx  Name Route Sig Dispense Refill  . ASPIRIN EC 81 MG PO TBEC Oral Take 81 mg by mouth once.    . B COMPLEX PO TABS Oral Take 1 tablet by mouth daily.      Marland Kitchen CARVEDILOL 12.5 MG PO TABS Oral Take 1.5 tablets (18.75 mg total) by mouth 2 (two) times daily with a meal. 90 tablet 6  . VITAMIN D3 5000 UNITS PO CAPS Oral Take 1 capsule by mouth daily.      . CO Q-10 PO  1 tab po qd     . DULOXETINE HCL 60 MG PO CPEP Oral Take 60 mg by mouth daily.    Marland Kitchen LETROZOLE 2.5 MG PO TABS  TAKE 1 TABLET DAILY 30 tablet 11  .  LISINOPRIL 10 MG PO TABS  TAKE 1 TABLET TWICE DAILY 60 tablet 8  . LORAZEPAM 1 MG PO TABS Oral Take 1 mg by mouth 2 (two) times daily.      Marland Kitchen METHOCARBAMOL 750 MG PO TABS Oral Take 750 mg by mouth daily.      . CENTRUM SILVER ULTRA WOMENS PO Oral Take 1 tablet by mouth daily.      . OXYCODONE-ACETAMINOPHEN 5-325 MG PO TABS Oral Take 1 tablet by mouth 4 (four) times daily. 4 times daily    . POTASSIUM CHLORIDE CRYS ER 20 MEQ PO TBCR Oral Take 20 mEq by mouth as needed. With fluid pill    . PREGABALIN 200 MG PO CAPS Oral Take by mouth daily.     Marland Kitchen PRENATAL VITAMINS 0.8 MG PO TABS Oral Take 1 tablet by mouth daily.      Marland Kitchen RABEPRAZOLE SODIUM 20 MG PO TBEC Oral Take 20 mg by mouth daily.      Marland Kitchen SPIRONOLACTONE 25 MG PO TABS  TAKE 1/2 TABLET BY MOUTH DAILY 15 tablet 8  . TORSEMIDE 20 MG PO TABS Oral Take 20 mg by mouth as  needed.      Marland Kitchen ZOLPIDEM TARTRATE 10 MG PO TABS Oral Take 10 mg by mouth daily.       BP 103/56  Pulse 105  Temp(Src) 97.3 F (36.3 C) (Oral)  Resp 18  SpO2 97%  Physical Exam CONSTITUTIONAL: Well developed/well nourished HEAD AND FACE: Normocephalic/atraumatic EYES: EOMI/PERRL ENMT: Mucous membranes moist NECK: supple no meningeal signs SPINE:entire spine nontender CV: S1/S2 noted, no murmurs/rubs/gallops noted LUNGS: Lungs are clear to auscultation bilaterally, no apparent distress ABDOMEN: soft, nontender, no rebound or guarding GU:no cva tenderness NEURO: Pt is awake/alert, moves all extremitiesx4 EXTREMITIES: pulses normal, full ROM, no edema noted SKIN: warm, color normal PSYCH: no abnormalities of mood noted  ED Course  Procedures   Labs Reviewed  CBC - Abnormal; Notable for the following:    WBC 10.7 (*)    All other components within normal limits  BASIC METABOLIC PANEL - Abnormal; Notable for the following:    Glucose, Bld 199 (*)    Creatinine, Ser 1.14 (*)    GFR calc non Af Amer 59 (*)    GFR calc Af Amer 68 (*)    All other components within normal limits  DIFFERENTIAL  POCT I-STAT TROPONIN I  I-STAT TROPONIN I  PRO B NATRIURETIC PEPTIDE  I-STAT TROPONIN I   Dg Chest 2 View  07/12/2011  *RADIOLOGY REPORT*  Clinical Data: Mid chest pain since yesterday.  CHEST - 2 VIEW  Comparison: 12/02/2010  Findings: The heart size and pulmonary vascularity are normal. The lungs appear clear and expanded without focal air space disease or consolidation. No blunting of the costophrenic angles.  Surgical clips in the right axilla.  Surgical clips in the right upper quadrant.  No significant change since prior study.  IMPRESSION: No evidence of active pulmonary disease.  Original Report Authenticated By: Marlon Pel, M.D.    2:25 AM Pt with h/o cardiomyopathy due to chemo (not on chemo at this time) Last EF at 30%, normal coronaries per cardiology noted Will  follow closely  5:34 AM Pt without CP Feels well, no acute change I asked her to f/u in CHF clinic this week  The patient appears reasonably screened and/or stabilized for discharge and I doubt any other medical condition or other [facility] requiring further screening, evaluation, or treatment in the  ED at this time prior to discharge.    MDM  Nursing notes reviewed and considered in documentation Previous records reviewed and considered  labs/vitals reviewed and considered xrays reviewed and considered    Date: 07/12/2011  Rate: 103  Rhythm: normal sinus rhythm  QRS Axis: normal  Intervals: normal  ST/T Wave abnormalities: nonspecific ST changes  Conduction Disutrbances:none  Narrative Interpretation:   Old EKG Reviewed: unchanged        Joya Gaskins, MD 07/12/11 [id number]

## 2011-07-15 ENCOUNTER — Emergency Department (HOSPITAL_COMMUNITY): Payer: Medicare Other

## 2011-07-15 ENCOUNTER — Encounter (HOSPITAL_COMMUNITY): Payer: Self-pay

## 2011-07-15 ENCOUNTER — Ambulatory Visit (HOSPITAL_COMMUNITY)
Admission: RE | Admit: 2011-07-15 | Discharge: 2011-07-15 | Disposition: A | Discharge status: Home / Self Care | Payer: Medicare Other | Source: Ambulatory Visit | Attending: Internal Medicine | Admitting: Internal Medicine

## 2011-07-15 ENCOUNTER — Emergency Department (HOSPITAL_COMMUNITY)
Admission: EM | Admit: 2011-07-15 | Discharge: 2011-07-15 | Disposition: A | Discharge status: Home / Self Care | Payer: Medicare Other | Attending: Emergency Medicine | Admitting: Emergency Medicine

## 2011-07-15 VITALS — HR 98

## 2011-07-15 DIAGNOSIS — Z7982 Long term (current) use of aspirin: Secondary | ICD-10-CM | POA: Insufficient documentation

## 2011-07-15 DIAGNOSIS — I319 Disease of pericardium, unspecified: Secondary | ICD-10-CM | POA: Diagnosis not present

## 2011-07-15 DIAGNOSIS — Z853 Personal history of malignant neoplasm of breast: Secondary | ICD-10-CM | POA: Diagnosis not present

## 2011-07-15 DIAGNOSIS — R4789 Other speech disturbances: Secondary | ICD-10-CM | POA: Insufficient documentation

## 2011-07-15 DIAGNOSIS — I10 Essential (primary) hypertension: Secondary | ICD-10-CM | POA: Diagnosis not present

## 2011-07-15 DIAGNOSIS — IMO0001 Reserved for inherently not codable concepts without codable children: Secondary | ICD-10-CM | POA: Diagnosis not present

## 2011-07-15 DIAGNOSIS — I517 Cardiomegaly: Secondary | ICD-10-CM | POA: Insufficient documentation

## 2011-07-15 DIAGNOSIS — I509 Heart failure, unspecified: Secondary | ICD-10-CM | POA: Diagnosis not present

## 2011-07-15 DIAGNOSIS — Z79899 Other long term (current) drug therapy: Secondary | ICD-10-CM | POA: Insufficient documentation

## 2011-07-15 DIAGNOSIS — R531 Weakness: Secondary | ICD-10-CM | POA: Insufficient documentation

## 2011-07-15 DIAGNOSIS — E119 Type 2 diabetes mellitus without complications: Secondary | ICD-10-CM | POA: Insufficient documentation

## 2011-07-15 DIAGNOSIS — R110 Nausea: Secondary | ICD-10-CM | POA: Diagnosis not present

## 2011-07-15 DIAGNOSIS — I428 Other cardiomyopathies: Secondary | ICD-10-CM | POA: Diagnosis not present

## 2011-07-15 DIAGNOSIS — R1310 Dysphagia, unspecified: Secondary | ICD-10-CM | POA: Diagnosis not present

## 2011-07-15 DIAGNOSIS — R4702 Dysphasia: Secondary | ICD-10-CM

## 2011-07-15 DIAGNOSIS — R5381 Other malaise: Secondary | ICD-10-CM | POA: Diagnosis not present

## 2011-07-15 DIAGNOSIS — G43909 Migraine, unspecified, not intractable, without status migrainosus: Secondary | ICD-10-CM | POA: Diagnosis not present

## 2011-07-15 DIAGNOSIS — R0989 Other specified symptoms and signs involving the circulatory and respiratory systems: Secondary | ICD-10-CM

## 2011-07-15 DIAGNOSIS — I5023 Acute on chronic systolic (congestive) heart failure: Secondary | ICD-10-CM

## 2011-07-15 LAB — BASIC METABOLIC PANEL
BUN: 19 mg/dL (ref 6–23)
Calcium: 9.7 mg/dL (ref 8.4–10.5)
Creatinine, Ser: 0.75 mg/dL (ref 0.50–1.10)
GFR calc Af Amer: >90 mL/min (ref >90)
GFR calc non Af Amer: >90 mL/min (ref >90)
Potassium: 3.8 mEq/L (ref 3.5–5.1)

## 2011-07-15 LAB — CBC
Hemoglobin: 13.4 g/dL (ref 12.0–15.0)
MCHC: 34.4 g/dL (ref 30.0–36.0)
Platelets: 249 10*3/uL (ref 150–400)
RDW: 13.7 % (ref 11.5–15.5)

## 2011-07-15 MED ORDER — SODIUM CHLORIDE 0.9 % IV BOLUS (SEPSIS)
1000.0000 mL | Freq: Once | INTRAVENOUS | Status: AC
  Administered 2011-07-15: 1000 mL via INTRAVENOUS

## 2011-07-15 MED ORDER — GADOBENATE DIMEGLUMINE 529 MG/ML IV SOLN
30.0000 mL | Freq: Once | INTRAVENOUS | Status: AC
  Administered 2011-07-15: 30 mL via INTRAVENOUS

## 2011-07-15 MED ORDER — LORAZEPAM 2 MG/ML IJ SOLN
1.0000 mg | Freq: Once | INTRAMUSCULAR | Status: AC
  Administered 2011-07-15: 1 mg via INTRAVENOUS
  Filled 2011-07-15: qty 1

## 2011-07-15 NOTE — ED Notes (Signed)
MD at bedside. 

## 2011-07-15 NOTE — Assessment & Plan Note (Addendum)
Neurologic changes over the last 48 hours. Generalized weakness. Difficulty speaking. Will transfer to ED for further neurological work up  Attending: I did not see patient. She was triaged by Tonye Becket, NP and sent to ER for evaluation of altered mental status and neuro changes. Agree with plan.

## 2011-07-15 NOTE — Progress Notes (Signed)
Patient ID: [name], female   DOB: May 05, 1971, 41 y.o.   MRN: [id number] Patient ID: [name], female   DOB: 10-Jul-1970, 41 y.o.   MRN: [id number] History of Present Illness: Primary Cardiologist:  Dr. Arvilla Meres  [name] is a 41 y.o. female with a history of morbid obesity, depression, fibromyalgia, congestive heart failure secondary to nonischemic cardiomyopathy related to her chemotherapy for breast cancer. Last chem 2008. Still in remission.   CPX test Feb 2010. Peak VO2 was 14.5 which was 71% of predicted. When corrected for body weight the VO2 was 20.7. The slope was 27. RER 1.20. O2 pulse 73%. Overall this was felt to be only a very mild functional limitation due to her obesity and mild circulatory   CPX 2/11: pVO2 13.0 (72%) correct for ideal wt 57ml/kg/min RER 1.06 (submax) slope 28.2 O2 pulse 91% - felt no signifcant cardiac limitation. + deconditioning.   Had echo 11/10  EF back down to 25%. So patient underwent repeat echo, CPX and RHC for profound fatigue. CPX as above. RHC normal. Echo 2/11 showed EF 50%  Echo 1/12 EF 40%. So we did MUGA EF 58%.   I saw her a couple weeks ago for follow up after 2 trips to the ED for chest pain.  She was also noting DOE.  BNP was up some.  I increased her demadex and set up a a myoview.  Myoview done with mod anteroapical scar, no ischemia and EF now lower at 29%.  Anterior defect probably breast attenuation.  Follow up BNP was 58.    Echo done 7/12: EF 40-45%.   Cath (12/28/10) was set up and demonstrated EF 20-25% and normal coronary arteries.  RA  4, RV 35/11, PA  28/8 (17), PCWP 8. Fick 5.2/2.4 PVR 1.7 Woods. Fick 5.2/2.4 Aortic saturation was 97%.  PA  saturation was 67% and 68%.   She is being seen as an acute work-in. She had a scheduled cardiac MRI. 2 weeks ago she had flu like symptoms and improved. Complains of headache and chest tightness last week. She went ED January for headache and chest pain and it was thought  to be headache and anxiety. Over the last 48 hours she complains of difficulty with speaking, head control. Weight has been 242. She has had 2 Toresemide yesterday.  No appetitie.  Complains of fatiuge. Difficulty ambulating.   Echo today which I reviewed 25-30%   Past Medical History  Diagnosis Date  . 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 Breast cancer     s/p mastectomy and chemotherapy, last on september 2008  . Peripheral neuropathy     chemo- induced  . Hypertension     c/b orthostatic hypotetion  . Depression   . Palpitation     normal sinus rythm only on 21 day heart monitor  . Cough   . Obesity   . Dizziness   . Fibromyalgia     Current Outpatient Prescriptions  Medication Sig Dispense Refill  . b complex vitamins tablet Take 1 tablet by mouth daily.        . Calcium-Vitamin D (CALTRATE 600 PLUS-VIT D PO) Take 1 tablet by mouth daily.        . carvedilol (COREG) 12.5 MG tablet Take 1.5 tablets (18.75 mg total) by mouth 2 (two) times daily with a meal.  90 tablet  6  . Cholecalciferol (VITAMIN D3) 5000 UNITS CAPS Take 1 capsule by mouth daily.        . Coenzyme Q10 (CO Q-10 PO) 1 tab po qd       . Flaxseed, Linseed, (FLAXSEED OIL) OIL 2 teaspoons daily       . letrozole (FEMARA) 2.5 MG tablet TAKE 1 TABLET DAILY  30 tablet  11  . lisinopril (PRINIVIL,ZESTRIL) 10 MG tablet Take 10 mg by mouth 2 (two) times daily.        Marland Kitchen LORazepam (ATIVAN) 1 MG tablet Take 1 mg by mouth 2 (two) times daily.        . methocarbamol (ROBAXIN) 750 MG tablet Take 750 mg by mouth daily.        . Multiple Vitamins-Minerals (CENTRUM SILVER ULTRA WOMENS PO) Take 1 tablet by mouth daily.        Marland Kitchen oxyCODONE-acetaminophen (PERCOCET) 5-325 MG per tablet Take 1 tablet by mouth 4 (four) times daily. 4 times daily      . potassium chloride SA (K-DUR,KLOR-CON) 20 MEQ tablet Take 20 mEq by mouth as  needed. With fluid pill      . pregabalin (LYRICA) 200 MG capsule Take by mouth daily.       . Prenatal Multivit-Min-Fe-FA (PRENATAL VITAMINS) 0.8 MG tablet Take 1 tablet by mouth daily.        . RABEprazole (ACIPHEX) 20 MG tablet Take 20 mg by mouth daily.        Marland Kitchen spironolactone (ALDACTONE) 25 MG tablet 25 mg. Take 1/2 tablet daily       . torsemide (DEMADEX) 20 MG tablet Take 20 mg by mouth as needed.        . traZODone (DESYREL) 100 MG tablet Take 50 mg by mouth as needed.       . zolpidem (AMBIEN) 10 MG tablet Take 10 mg by mouth daily.         Allergies: Allergies  Allergen Reactions  . Reglan (Metoclopramide Hcl)    Social history:  Nonsmoker  Vital Signs:T 97.5  Doppler 82   Pulse 98  SpO2 100% PHYSICAL EXAM: Well nourished, well developed, in no acute distress HEENT: normal Neck: JVD flat Cardiac:  normal S1, S2; RRR; no murmur, No S3 Lungs:  clear to auscultation bilaterally, no wheezing, rhonchi or rales Abd: soft, nontender, no hepatomegaly Ext: no edema; Equal strength bilaterally Skin: warm and dry Neuro:  CNs 2-12 intact, no focal abnormalities noted Psych: Normal affect  ASSESSMENT AND PLAN:

## 2011-07-15 NOTE — ED Notes (Signed)
Pt given socks  

## 2011-07-15 NOTE — ED Provider Notes (Signed)
History     CSN: [id number]  Arrival date & time 07/15/11  1548   First MD Initiated Contact with Patient 07/15/11 1635      Chief Complaint  Patient presents with  . Aphasia     HPI This young female with multiple medical problems, including fibromyalgia, now presents with 4 days of dysphagia and generalized fatigue.  She was seen in this facility last week for chest pain.  She notes that following that presentation she was briefly well.  Her symptoms began gradually.  Since onset she has had difficulty speaking, difficulty controlling the motions of her neck and "whole body".  She denies any focal pain.  She notes that she has slept 4 hours total in 4 nights.  She denies any nausea, vomiting, new chest pain, abdominal pain, diarrhea, dyspnea.  Past Medical History  Diagnosis Date  . 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 Breast cancer     s/p mastectomy and chemotherapy, last on september 2008  . Peripheral neuropathy     chemo- induced  . Hypertension     c/b orthostatic hypotetion  . Depression   . Palpitation     normal sinus rythm only on 21 day heart monitor  . Cough   . Obesity   . Dizziness   . Fibromyalgia   . Diabetes mellitus     Past Surgical History  Procedure Date  . Abdominal hysterectomy   . Laparoscopic endometriosis fulguration   . Breast surgery     Family History  Problem Relation Age of Onset  . Coronary artery disease    . Heart attack      History  Substance Use Topics  . Smoking status: Never Smoker   . Smokeless tobacco: Not on file  . Alcohol Use: No    OB History    Grav Para Term Preterm Abortions TAB SAB Ect Mult Living                  Review of Systems  Constitutional:       HPI  HENT:       HPI otherwise negative  Eyes: Negative.   Respiratory:       HPI, otherwise negative  Cardiovascular:       HPI,  otherwise nmegative  Gastrointestinal: Negative for vomiting.  Genitourinary:       HPI, otherwise negative  Musculoskeletal:       HPI, otherwise negative  Skin: Negative.   Neurological: Negative for syncope.    Allergies  Reglan  Home Medications   Current Outpatient Rx  Name Route Sig Dispense Refill  . ASPIRIN EC 81 MG PO TBEC Oral Take 81 mg by mouth once.    . B COMPLEX PO TABS Oral Take 1 tablet by mouth daily.      Marland Kitchen CARVEDILOL 12.5 MG PO TABS Oral Take 1.5 tablets (18.75 mg total) by mouth 2 (two) times daily with a meal. 90 tablet 6  . VITAMIN D3 5000 UNITS PO CAPS Oral Take 1 capsule by mouth daily.      . CO Q-10 PO Oral Take 30 mg by mouth daily.     . DULOXETINE HCL 60 MG PO CPEP Oral Take 60 mg by mouth daily.    Marland Kitchen ELMIRON 100 MG PO CAPS Oral Take 100 mg by mouth Twice daily.    Marland Kitchen LETROZOLE 2.5  MG PO TABS Oral Take 2.5 mg by mouth daily.    Marland Kitchen LISINOPRIL 10 MG PO TABS Oral Take 10 mg by mouth Twice daily.    Marland Kitchen LORAZEPAM 1 MG PO TABS Oral Take 1 mg by mouth 2 (two) times daily.      Marland Kitchen METHOCARBAMOL 750 MG PO TABS Oral Take 750 mg by mouth daily.      . CENTRUM SILVER ULTRA WOMENS PO Oral Take 1 tablet by mouth daily.      . OXYCODONE-ACETAMINOPHEN 5-325 MG PO TABS Oral Take 1 tablet by mouth 4 (four) times daily. 4 times daily    . POTASSIUM CHLORIDE CRYS ER 20 MEQ PO TBCR Oral Take 20 mEq by mouth daily as needed. To be taken with fluid pill    . PREGABALIN 200 MG PO CAPS Oral Take by mouth daily.     Marland Kitchen PRENATAL VITAMINS 0.8 MG PO TABS Oral Take 1 tablet by mouth daily.      Marland Kitchen RABEPRAZOLE SODIUM 20 MG PO TBEC Oral Take 20 mg by mouth daily.      Marland Kitchen SPIRONOLACTONE 25 MG PO TABS Oral Take 12.5 mg by mouth daily.    . TORSEMIDE 20 MG PO TABS Oral Take 20 mg by mouth daily as needed. For fluid retention; to be taken with Potassium    . ZOLPIDEM TARTRATE 10 MG PO TABS Oral Take 10 mg by mouth at bedtime as needed. For insomnia      BP 107/60  Pulse 86  Temp(Src) 98  F (36.7 C) (Oral)  Resp 11  Ht 5\' 7"  (1.702 m)  Wt 234 lb (106.142 kg)  BMI 36.65 kg/m2  SpO2 98%  Physical Exam  Nursing note and vitals reviewed. Constitutional: She is oriented to person, place, and time. She appears well-developed and well-nourished. No distress.  HENT:  Head: Normocephalic and atraumatic.  Eyes: Conjunctivae and EOM are normal. Pupils are equal, round, and reactive to light. Right eye exhibits no discharge. Left eye exhibits no discharge.  Neck: Neck supple. No tracheal deviation present. No thyromegaly present.  Cardiovascular: Normal rate and regular rhythm.   Pulmonary/Chest: Effort normal and breath sounds normal. No stridor. No respiratory distress. She has no wheezes.  Abdominal: Soft. She exhibits no distension.  Musculoskeletal: She exhibits no edema.  Lymphadenopathy:    She has no cervical adenopathy.  Neurological: She is alert and oriented to person, place, and time. She has normal strength. She displays no atrophy and no tremor. No cranial nerve deficit or sensory deficit. She exhibits normal muscle tone. She displays a negative Romberg sign. Coordination and gait normal.       The patient notes that she is having difficulty chewing or neck, though she has full capacity to move appropriately, is holding her head upright, nodding appropriately. In spite of the patient's description of speech difficulty, she speaks clearly, though haltingly, artificially. No facial droop  Psychiatric: She is slowed.       The patient's speech, which she describes as slurred, is clear, though spoken in broken phrases, non-tangential.  No slurring    ED Course  Procedures (including critical care time)   Labs Reviewed  BASIC METABOLIC PANEL  CBC   No results found.   No diagnosis found.  CT, reviewed by me   MDM  This young female with multiple medical problems, including fibromyalgia, now presents with 4 days of dysphasia.  On exam the patient has no  unilateral strength deficiencies, appropriate sensation  throughout, and has no verbal changes consistent with intracranial changes.  Her speech is clear, though haltingly with broken phrases.  No slurring, no facial droop.  The patient does complain of difficulty controlling her neck and muscles, though she is in moving seemingly appropriately throughout the interview and holding her head upright, cooperative throughout all phases of the neurologic exam.  Given these findings, the negative head CT, the patient's history, she is stable for discharge with a.m. follow up with her physician and her neurologist.          Gerhard Munch, MD 07/15/11 [phone]

## 2011-07-15 NOTE — ED Notes (Signed)
PT ambulated with a steady gait; VSS; A&Ox3; no signs of distress; skin warm and dry; respirations even and unlabored.

## 2011-07-15 NOTE — ED Notes (Signed)
Pt. Was having a cardiac MRI and then stopped down at the clinic, and they noticed that pt.'s  Speech is very slurred and unable to hold her head up.  Pt. Reports the symptoms started on  On Monday night.  Sunday she was seen at out ED for headache and chest pain but discharged home .   Continues to have a headache and posterior neck tightness.  Pt. Is alert and oriented X3

## 2011-07-15 NOTE — ED Notes (Signed)
Pt's speech therapy upon discharge was of normal pattern.

## 2011-07-20 DIAGNOSIS — G620 Drug-induced polyneuropathy: Secondary | ICD-10-CM | POA: Diagnosis not present

## 2011-07-20 DIAGNOSIS — Z853 Personal history of malignant neoplasm of breast: Secondary | ICD-10-CM | POA: Diagnosis not present

## 2011-07-20 DIAGNOSIS — G4700 Insomnia, unspecified: Secondary | ICD-10-CM | POA: Diagnosis not present

## 2011-07-20 DIAGNOSIS — I200 Unstable angina: Secondary | ICD-10-CM | POA: Diagnosis not present

## 2011-07-21 DIAGNOSIS — R4789 Other speech disturbances: Secondary | ICD-10-CM | POA: Diagnosis not present

## 2011-07-27 DIAGNOSIS — F341 Dysthymic disorder: Secondary | ICD-10-CM | POA: Diagnosis not present

## 2011-07-27 DIAGNOSIS — G541 Lumbosacral plexus disorders: Secondary | ICD-10-CM | POA: Diagnosis not present

## 2011-07-27 DIAGNOSIS — F411 Generalized anxiety disorder: Secondary | ICD-10-CM | POA: Diagnosis not present

## 2011-07-27 DIAGNOSIS — Z79899 Other long term (current) drug therapy: Secondary | ICD-10-CM | POA: Diagnosis not present

## 2011-07-27 DIAGNOSIS — M542 Cervicalgia: Secondary | ICD-10-CM | POA: Diagnosis not present

## 2011-07-27 DIAGNOSIS — F329 Major depressive disorder, single episode, unspecified: Secondary | ICD-10-CM | POA: Diagnosis not present

## 2011-07-28 ENCOUNTER — Ambulatory Visit (HOSPITAL_COMMUNITY)
Admission: RE | Admit: 2011-07-28 | Discharge: 2011-07-28 | Disposition: A | Discharge status: Home / Self Care | Payer: Medicare Other | Source: Ambulatory Visit | Attending: Internal Medicine | Admitting: Internal Medicine

## 2011-07-28 VITALS — BP 104/62 | HR 106 | Wt 247.8 lb

## 2011-07-28 DIAGNOSIS — I5022 Chronic systolic (congestive) heart failure: Secondary | ICD-10-CM | POA: Diagnosis not present

## 2011-07-28 DIAGNOSIS — I5023 Acute on chronic systolic (congestive) heart failure: Secondary | ICD-10-CM | POA: Diagnosis not present

## 2011-07-28 NOTE — Patient Instructions (Signed)
Continue current medications  Follow up 4-5 weeks

## 2011-08-02 ENCOUNTER — Other Ambulatory Visit: Payer: Self-pay | Admitting: Neurology

## 2011-08-02 ENCOUNTER — Ambulatory Visit
Admission: RE | Admit: 2011-08-02 | Discharge: 2011-08-02 | Disposition: A | Discharge status: Home / Self Care | Payer: Medicare Other | Source: Ambulatory Visit | Attending: Oncology | Admitting: Oncology

## 2011-08-02 DIAGNOSIS — Z853 Personal history of malignant neoplasm of breast: Secondary | ICD-10-CM

## 2011-08-02 DIAGNOSIS — R928 Other abnormal and inconclusive findings on diagnostic imaging of breast: Secondary | ICD-10-CM | POA: Diagnosis not present

## 2011-08-02 DIAGNOSIS — Z9889 Other specified postprocedural states: Secondary | ICD-10-CM

## 2011-08-02 DIAGNOSIS — G4700 Insomnia, unspecified: Secondary | ICD-10-CM

## 2011-08-02 DIAGNOSIS — I420 Dilated cardiomyopathy: Secondary | ICD-10-CM

## 2011-08-02 DIAGNOSIS — I200 Unstable angina: Secondary | ICD-10-CM

## 2011-08-02 DIAGNOSIS — G620 Drug-induced polyneuropathy: Secondary | ICD-10-CM

## 2011-08-04 ENCOUNTER — Ambulatory Visit
Admission: RE | Admit: 2011-08-04 | Discharge: 2011-08-04 | Disposition: A | Discharge status: Home / Self Care | Payer: Medicare Other | Source: Ambulatory Visit | Attending: Neurology | Admitting: Neurology

## 2011-08-04 ENCOUNTER — Other Ambulatory Visit: Payer: Self-pay | Admitting: Neurology

## 2011-08-04 DIAGNOSIS — Z853 Personal history of malignant neoplasm of breast: Secondary | ICD-10-CM

## 2011-08-04 DIAGNOSIS — I420 Dilated cardiomyopathy: Secondary | ICD-10-CM

## 2011-08-04 DIAGNOSIS — G4700 Insomnia, unspecified: Secondary | ICD-10-CM

## 2011-08-04 DIAGNOSIS — G620 Drug-induced polyneuropathy: Secondary | ICD-10-CM | POA: Diagnosis not present

## 2011-08-04 DIAGNOSIS — I200 Unstable angina: Secondary | ICD-10-CM

## 2011-08-04 DIAGNOSIS — R4789 Other speech disturbances: Secondary | ICD-10-CM | POA: Diagnosis not present

## 2011-08-05 ENCOUNTER — Other Ambulatory Visit: Payer: Medicare Other

## 2011-08-05 NOTE — Assessment & Plan Note (Addendum)
She is stable from a HF perspective. Volume status looks OK. Will continue current regimen at this time. We have discussed the possibility of ICD but need to wait until neurology w/u is complete so we do not limit their imaging abilities with an implantable device. Encouraged her to watch her diet more closely and not to continue to gain weight. Reinforced need for daily weights and reviewed use of sliding scale diuretics.

## 2011-08-05 NOTE — Progress Notes (Signed)
History of Present Illness: Primary Cardiologist:  Dr. Arvilla Meres Neurlogist: [place]   PCP: Dr. Maurice Small  [name] is a 41 y.o. female with a history of morbid obesity, depression, fibromyalgia, congestive heart failure secondary to nonischemic cardiomyopathy related to her chemotherapy for breast cancer. Last chem 2008. Still in remission.   CPX Feb 2010. Peak VO2 was 14.5 which was 71% of predicted. When corrected for body weight the VO2 was 20.7. The slope was 27. RER 1.20. O2 pulse 73%. Overall this was felt to be only a very mild functional limitation due to her obesity and mild circulatory  CPX 2/11: pVO2 13.0 (72%) correct for ideal wt 38ml/kg/min RER 1.06 (submax) slope 28.2 O2 pulse 91% - felt no signifcant cardiac limitation. + deconditioning.   Echo 04/2009  EF back down to 25%.  RHC normal. Echo 07/2009 showed EF 50%  Echo 06/2010 EF 40%. So we did MUGA EF 58% Echo 12/2010 40-45% 05/2011 EF 30%.  Grade 2 diastolic dysfunction Cath (12/28/10) was set up and demonstrated EF 20-25% and normal coronary arteries.  RA  4, RV 35/11, PA  28/8 (17), PCWP 8. Fick 5.2/2.4 PVR 1.7 Woods. Fick 5.2/2.4 Aortic saturation was 97%.  PA  saturation was 67% and 68%.   She returns for routine follow up today.  Major complaint is slurred/broken speech.  Still complains of fatiuge. SOB on exertion, unchanged from baseline. Denies PND/Orthopnea. Denies dizziness.  She is taking demadex  about twice a week.  She is craving sweets over the last 6-8 weeks where she is eating a lot of candy throughout the night, weight up 20 pounds since December.  Cardiac MRI on 07/19/11: Moderate to severe LVE, Diffuse hypokinesis. EF 32%, Mild LAE    Past Medical History  Diagnosis Date  . 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 Breast cancer     s/p mastectomy and chemotherapy, last on september 2008  .  Peripheral neuropathy     chemo- induced  . Hypertension     c/b orthostatic hypotetion  . Depression   . Palpitation     normal sinus rythm only on 21 day heart monitor  . Cough   . Obesity   . Dizziness   . Fibromyalgia   . Diabetes mellitus    Current Outpatient Prescriptions on File Prior to Encounter  Medication Sig Dispense Refill  . carvedilol (COREG) 12.5 MG tablet Take 1.5 tablets (18.75 mg total) by mouth 2 (two) times daily with a meal.  90 tablet  6  . Cholecalciferol (VITAMIN D3) 5000 UNITS CAPS Take 2 capsules by mouth daily.       . Coenzyme Q10 (CO Q-10 PO) Take 100 mg by mouth daily.       . DULoxetine (CYMBALTA) 60 MG capsule Take 60 mg by mouth daily.      Marland Kitchen ELMIRON 100 MG capsule Take 100 mg by mouth Twice daily.      Marland Kitchen letrozole (FEMARA) 2.5 MG tablet Take 2.5 mg by mouth daily.      Marland Kitchen lisinopril (PRINIVIL,ZESTRIL) 10 MG tablet Take 10 mg by mouth Twice daily.      Marland Kitchen LORazepam (ATIVAN) 1 MG tablet Take 1 mg by mouth 2 (two) times daily.        . methocarbamol (ROBAXIN) 750 MG tablet Take 750 mg by mouth 2 (two) times daily.       Marland Kitchen  Multiple Vitamins-Minerals (CENTRUM SILVER ULTRA WOMENS PO) Take 1 tablet by mouth daily.        Marland Kitchen oxyCODONE-acetaminophen (PERCOCET) 5-325 MG per tablet Take 1 tablet by mouth 4 (four) times daily. 4 times daily      . potassium chloride SA (K-DUR,KLOR-CON) 20 MEQ tablet Take 20 mEq by mouth daily as needed. To be taken with fluid pill      . pregabalin (LYRICA) 200 MG capsule Take 600 mg by mouth at bedtime.       . RABEprazole (ACIPHEX) 20 MG tablet Take 20 mg by mouth daily.        Marland Kitchen spironolactone (ALDACTONE) 25 MG tablet Take 12.5 mg by mouth daily.      Marland Kitchen torsemide (DEMADEX) 20 MG tablet Take 20 mg by mouth daily as needed. For fluid retention; to be taken with Potassium      . zolpidem (AMBIEN) 10 MG tablet Take 10 mg by mouth at bedtime as needed. For insomnia      . b complex vitamins tablet Take 1 tablet by mouth daily.            Allergies  Allergen Reactions  . Reglan (Metoclopramide Hcl)    ROS: All pertinent positives or negatives as in HPI otherwise negative   Vital Signs: Filed Vitals:   07/28/11 1240  BP: 104/62  Pulse: 106  Weight: 247 lb 12 oz (112.379 kg)  SpO2: 100%   PHYSICAL EXAM: Well nourished, well developed, in no acute distress, broken speech HEENT: normal Neck: JVD flat Cardiac:  normal S1, S2; RRR; no murmur, No S3 Lungs:  clear to auscultation bilaterally, no wheezing, rhonchi or rales Abd: obese, soft, nontender, no hepatomegaly Ext: no edema; RFA site without hematoma or bruit Skin: warm and dry Neuro:  CNs 2-12 intact, no focal abnormalities noted Psych: Normal affect  ASSESSMENT AND PLAN:

## 2011-08-06 ENCOUNTER — Encounter: Payer: Self-pay | Admitting: Internal Medicine

## 2011-08-06 DIAGNOSIS — G4710 Hypersomnia, unspecified: Secondary | ICD-10-CM | POA: Diagnosis not present

## 2011-08-06 DIAGNOSIS — G4734 Idiopathic sleep related nonobstructive alveolar hypoventilation: Secondary | ICD-10-CM | POA: Diagnosis not present

## 2011-08-12 ENCOUNTER — Other Ambulatory Visit: Payer: Medicare Other | Admitting: Lab

## 2011-08-12 ENCOUNTER — Ambulatory Visit: Payer: Medicare Other | Admitting: Oncology

## 2011-08-16 ENCOUNTER — Ambulatory Visit (INDEPENDENT_AMBULATORY_CARE_PROVIDER_SITE_OTHER): Payer: Medicare Other | Admitting: Surgery

## 2011-08-16 ENCOUNTER — Other Ambulatory Visit (INDEPENDENT_AMBULATORY_CARE_PROVIDER_SITE_OTHER): Payer: Self-pay | Admitting: Surgery

## 2011-08-16 ENCOUNTER — Encounter (INDEPENDENT_AMBULATORY_CARE_PROVIDER_SITE_OTHER): Payer: Self-pay | Admitting: Surgery

## 2011-08-16 DIAGNOSIS — Z853 Personal history of malignant neoplasm of breast: Secondary | ICD-10-CM

## 2011-08-16 DIAGNOSIS — Z9889 Other specified postprocedural states: Secondary | ICD-10-CM

## 2011-08-16 NOTE — Patient Instructions (Addendum)
Lumpectomy, Breast Conserving Surgery A lumpectomy is breast surgery that removes only part of the breast. Another name used may be partial mastectomy. The amount removed varies. Make sure you understand how much of your breast will be removed. Reasons for a lumpectomy:  Any solid breast mass.   Grouped significant nodularity that may be confused with a solitary breast mass.  Lumpectomy is the most common form of breast cancer surgery today. The surgeon removes the portion of your breast which contains the tumor (cancer). This is the lump. Some normal tissue around the lump is also removed to be sure that all the tumor has been removed.  If cancer cells are found in the margins where the breast tissue was removed, your surgeon will do more surgery to remove the remaining cancer tissue. This is called re-excision surgery. Radiation and/or chemotherapy treatments are often given following a lumpectomy to kill any cancer cells that could possibly remain.  REASONS YOU MAY NOT BE ABLE TO HAVE BREAST CONSERVING SURGERY:  The tumor is located in more than one place.   Your breast is small and the tumor is large so the breast would be disfigured.   The entire tumor removal is not successful with a lumpectomy.   You cannot commit to a full course of chemotherapy, radiation therapy or are pregnant and cannot have radiation.   You have previously had radiation to the breast to treat cancer.  HOW A LUMPECTOMY IS PERFORMED If overnight nursing is not required following a biopsy, a lumpectomy can be performed as a same-day surgery. This can be done in a hospital, clinic, or surgical center. The anesthesia used will depend on your surgeon. They will discuss this with you. A general anesthetic keeps you sleeping through the procedure. LET YOUR CAREGIVERS KNOW ABOUT THE FOLLOWING:  Allergies   Medications taken including herbs, eye drops, over the counter medications, and creams.   Use of steroids (by  mouth or creams)   Previous problems with anesthetics or Novocaine.   Possibility of pregnancy, if this applies   History of blood clots (thrombophlebitis)   History of bleeding or blood problems.   Previous surgery   Other health problems  BEFORE THE PROCEDURE You should be present one hour prior to your procedure unless directed otherwise.  AFTER THE PROCEDURE  After surgery, you will be taken to the recovery area where a nurse will watch and check your progress. Once you're awake, stable, and taking fluids well, barring other problems you will be allowed to go home.   Ice packs applied to your operative site may help with discomfort and keep the swelling down.   A small rubber drain may be placed in the breast for a couple of days to prevent a hematoma from developing in the breast.   A pressure dressing may be applied for 24 to 48 hours to prevent bleeding.   Keep the wound dry.   You may resume a normal diet and activities as directed. Avoid strenuous activities affecting the arm on the side of the biopsy site such as tennis, swimming, heavy lifting (more than 10 pounds) or pulling.   Bruising in the breast is normal following this procedure.   Wearing a bra - even to bed - may be more comfortable and also help keep the dressing on.   Change dressings as directed.   Only take over-the-counter or prescription medicines for pain, discomfort, or fever as directed by your caregiver.  Call for your results as   instructed by your surgeon. Remember it is your responsibility to get the results of your lumpectomy if your surgeon asked you to follow-up. Do not assume everything is fine if you have not heard from your caregiver. SEEK MEDICAL CARE IF:   There is increased bleeding (more than a small spot) from the wound.   You notice redness, swelling, or increasing pain in the wound.   Pus is coming from wound.   An unexplained oral temperature above 102 F (38.9 C) develops.     You notice a foul smell coming from the wound or dressing.  SEEK IMMEDIATE MEDICAL CARE IF:   You develop a rash.   You have difficulty breathing.   You have any allergic problems.  Document Released: 07/12/2006 Document Revised: 05/20/2011 Document Reviewed: 10/13/2006 [employer] Patient Information 2012 [employer]. 

## 2011-08-16 NOTE — Progress Notes (Signed)
Patient ID: [name], female   DOB: 05/08/1971, 41 y.o.   MRN: [id number]  Chief Complaint  Patient presents with  . Breast Problem    est pt new prob- eval R breast calcifications    HPI [name] is a 41 y.o. female.   HPI The patient presents at the request of Dr. Griffin do 2 microcalcifications in her right breast. She underwent lumpectomy on the right in 2008 by Dr. Yong for breast cancer. She has developed new calcifications in the right breast near the lumpectomy bed better to deep to undergo core biopsy. Excision biopsy recommended by the radiologist. She denies any history of breast pain, breast mass and relates shrinking of the right breast after radiation therapy. She also received chemotherapy for this in 2008.  Past Medical History  Diagnosis Date  . 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 Breast cancer     s/p mastectomy and chemotherapy, last on september 2008  . Peripheral neuropathy     chemo- induced  . Hypertension     c/b orthostatic hypotetion  . Depression   . Palpitation     normal sinus rythm only on 21 day heart monitor  . Cough   . Obesity   . Dizziness   . Fibromyalgia   . Diabetes mellitus   . Anemia   . Blood transfusion   . GERD (gastroesophageal reflux disease)   . Heart murmur   . Breast calcifications     right breast    Past Surgical History  Procedure Date  . Abdominal hysterectomy   . Laparoscopic endometriosis fulguration   . Breast surgery     rt breast lumpectomy    Family History  Problem Relation Age of Onset  . Coronary artery disease    . Heart attack    . Heart disease Mother   . Heart disease Maternal Uncle   . Cancer Paternal Uncle     colon  . Heart disease Maternal Grandmother   . Cancer Paternal Grandmother     ovarian    Social History History  Substance Use Topics  . Smoking status: Never  Smoker   . Smokeless tobacco: Not on file  . Alcohol Use: Yes    Allergies  Allergen Reactions  . Reglan (Metoclopramide Hcl)     Current Outpatient Prescriptions  Medication Sig Dispense Refill  . b complex vitamins tablet Take 1 tablet by mouth daily.        . beta carotene w/minerals (OCUVITE) tablet Take 1 tablet by mouth daily.      . carvedilol (COREG) 12.5 MG tablet Take 1.5 tablets (18.75 mg total) by mouth 2 (two) times daily with a meal.  90 tablet  6  . Cholecalciferol (VITAMIN D3) 5000 UNITS CAPS Take 2 capsules by mouth daily.       . Coenzyme Q10 (CO Q-10 PO) Take 100 mg by mouth daily.       . Cyanocobalamin 2500 MCG SUBL Place 2,500 mcg under the tongue daily.      . DULoxetine (CYMBALTA) 60 MG capsule Take 60 mg by mouth daily.      . ELMIRON 100 MG capsule Take 100 mg by mouth Twice daily.      . letrozole (FEMARA) 2.5 MG tablet Take 2.5 mg by mouth daily.      . lisinopril (PRINIVIL,ZESTRIL) 10 MG tablet   Take 10 mg by mouth Twice daily.      . LORazepam (ATIVAN) 1 MG tablet Take 1 mg by mouth 2 (two) times daily.        . Meth-Hyo-M Bl-Na Phos-Ph Sal (URIBEL) 118 MG CAPS Take 118 mg by mouth 2 (two) times daily.      . methocarbamol (ROBAXIN) 750 MG tablet Take 750 mg by mouth 2 (two) times daily.       . Multiple Vitamins-Minerals (CENTRUM SILVER ULTRA WOMENS PO) Take 1 tablet by mouth daily.        . oxyCODONE-acetaminophen (PERCOCET) 5-325 MG per tablet Take 1 tablet by mouth 4 (four) times daily. 4 times daily      . potassium chloride SA (K-DUR,KLOR-CON) 20 MEQ tablet Take 20 mEq by mouth daily as needed. To be taken with fluid pill      . pregabalin (LYRICA) 200 MG capsule Take 600 mg by mouth at bedtime.       . RABEprazole (ACIPHEX) 20 MG tablet Take 20 mg by mouth daily.        . spironolactone (ALDACTONE) 25 MG tablet Take 12.5 mg by mouth daily.      . torsemide (DEMADEX) 20 MG tablet Take 20 mg by mouth daily as needed. For fluid retention; to be taken  with Potassium      . zolpidem (AMBIEN) 10 MG tablet Take 10 mg by mouth at bedtime as needed. For insomnia        Review of Systems Review of Systems  Constitutional: Negative for fever, chills and unexpected weight change.  HENT: Negative for hearing loss, congestion, sore throat, trouble swallowing and voice change.   Eyes: Negative for visual disturbance.  Respiratory: Negative for cough and wheezing.   Cardiovascular: Negative for chest pain, palpitations and leg swelling.  Gastrointestinal: Negative for nausea, vomiting, abdominal pain, diarrhea, constipation, blood in stool, abdominal distention and anal bleeding.  Genitourinary: Negative for hematuria, vaginal bleeding and difficulty urinating.  Musculoskeletal: Negative for arthralgias.  Skin: Negative for rash and wound.  Neurological: Negative for seizures, syncope and headaches.  Hematological: Negative for adenopathy. Does not bruise/bleed easily.  Psychiatric/Behavioral: Negative for confusion.    Blood pressure 92/58, pulse 84, temperature 97.3 F (36.3 C), temperature source Temporal, resp. rate 18, height 5' 7" (1.702 m), weight 243 lb 6.4 oz (110.406 kg).  Physical Exam Physical Exam  Constitutional: She appears well-developed and well-nourished.  HENT:  Head: Normocephalic and atraumatic.  Eyes: EOM are normal. Pupils are equal, round, and reactive to light.  Neck: Normal range of motion. Neck supple.  Cardiovascular: Normal rate and regular rhythm.   Pulmonary/Chest: Effort normal and breath sounds normal.  Abdominal: Soft. Bowel sounds are normal.  Skin:     Psychiatric: Her speech is normal and behavior is normal. Judgment and thought content normal. Her mood appears anxious. Cognition and memory are normal.    Data Reviewed Mammogram shows cluster right breast microcalcifications upper-outer quadrant near lumpectomy bed.  Assessment    History of right breast cancer status post breast conservation  and postop chemotherapy 2008 Dr. Young with new calcifications adjacent to right lumpectomy bed to deep for needle biopsy    Plan    Recommend right breast needle localized excisional biopsy for further evaluation. Risk of bleeding, infection and the need for further surgical procedures discussed. She does have a cardiac history but looks like a stable from review of notes from Dr. Benshimon.         Tejuan Gholson A. 08/16/2011, 10:50 AM    

## 2011-08-17 ENCOUNTER — Telehealth (HOSPITAL_COMMUNITY): Payer: Self-pay | Admitting: *Deleted

## 2011-08-17 NOTE — Telephone Encounter (Signed)
Ok to proceed. 

## 2011-08-17 NOTE — Telephone Encounter (Signed)
Ms Larocca called today, she would like to discuss her upcoming surgery with you.  Thanks.

## 2011-08-17 NOTE — Telephone Encounter (Signed)
Spoke w/pt her mammogram showed calcium deposits which she has to have surgically removed and sent out for pathology, this is scheduled for 3/14 she just wanted to make sure Dr Gala Romney was aware and felt ok for surgery, she states wt has been pretty stable coming down some, she is taking the torsemide 2-3 times a week, will let Dr Gala Romney know

## 2011-08-18 ENCOUNTER — Telehealth (HOSPITAL_COMMUNITY): Payer: Self-pay | Admitting: Vascular Surgery

## 2011-08-18 ENCOUNTER — Encounter (HOSPITAL_COMMUNITY): Payer: Self-pay | Admitting: Pharmacy Technician

## 2011-08-18 NOTE — Telephone Encounter (Signed)
[name]'s mom wants to talk to you about a surgery that [name] has coming up. [name] said she spoke with you earlier today and her mom wants to follow up.

## 2011-08-18 NOTE — Telephone Encounter (Signed)
Spoke w/pt's mother she is aware that Dr Gala Romney said she is ok to proceed heart wise

## 2011-08-18 NOTE — Telephone Encounter (Signed)
Pt aware.

## 2011-08-19 ENCOUNTER — Telehealth: Payer: Self-pay | Admitting: Oncology

## 2011-08-19 ENCOUNTER — Other Ambulatory Visit: Payer: Self-pay

## 2011-08-19 ENCOUNTER — Ambulatory Visit (HOSPITAL_BASED_OUTPATIENT_CLINIC_OR_DEPARTMENT_OTHER): Payer: Medicare Other | Admitting: Oncology

## 2011-08-19 ENCOUNTER — Other Ambulatory Visit (HOSPITAL_BASED_OUTPATIENT_CLINIC_OR_DEPARTMENT_OTHER): Payer: Medicare Other | Admitting: Lab

## 2011-08-19 VITALS — BP 93/60 | HR 97 | Temp 98.5°F | Ht 67.0 in | Wt 249.3 lb

## 2011-08-19 DIAGNOSIS — Z170 Estrogen receptor positive status [ER+]: Secondary | ICD-10-CM | POA: Diagnosis not present

## 2011-08-19 DIAGNOSIS — I429 Cardiomyopathy, unspecified: Secondary | ICD-10-CM | POA: Diagnosis not present

## 2011-08-19 DIAGNOSIS — C50919 Malignant neoplasm of unspecified site of unspecified female breast: Secondary | ICD-10-CM

## 2011-08-19 DIAGNOSIS — C50519 Malignant neoplasm of lower-outer quadrant of unspecified female breast: Secondary | ICD-10-CM

## 2011-08-19 DIAGNOSIS — R635 Abnormal weight gain: Secondary | ICD-10-CM | POA: Diagnosis not present

## 2011-08-19 DIAGNOSIS — E559 Vitamin D deficiency, unspecified: Secondary | ICD-10-CM | POA: Diagnosis not present

## 2011-08-19 LAB — CBC WITH DIFFERENTIAL/PLATELET
BASO%: 0.7 % (ref 0.0–2.0)
EOS%: 2.1 % (ref 0.0–7.0)
HCT: 35.3 % (ref 34.8–46.6)
LYMPH%: 36.6 % (ref 14.0–49.7)
MCH: 28.7 pg (ref 25.1–34.0)
MCHC: 32.9 g/dL (ref 31.5–36.0)
MONO#: 0.6 10*3/uL (ref 0.1–0.9)
NEUT%: 52.8 % (ref 38.4–76.8)
Platelets: 250 10*3/uL (ref 145–400)
RBC: 4.04 10*6/uL (ref 3.70–5.45)
WBC: 8.1 10*3/uL (ref 3.9–10.3)
nRBC: 0 % (ref 0–0)

## 2011-08-19 NOTE — Telephone Encounter (Signed)
gve the pt her April 2013 appt calendar 

## 2011-08-20 ENCOUNTER — Other Ambulatory Visit: Payer: Self-pay | Admitting: *Deleted

## 2011-08-20 LAB — COMPREHENSIVE METABOLIC PANEL
ALT: 18 U/L (ref 0–35)
AST: 16 U/L (ref 0–37)
Alkaline Phosphatase: 88 U/L (ref 39–117)
BUN: 10 mg/dL (ref 6–23)
Calcium: 9.5 mg/dL (ref 8.4–10.5)
Creatinine, Ser: 0.75 mg/dL (ref 0.50–1.10)
Total Bilirubin: 0.3 mg/dL (ref 0.3–1.2)

## 2011-08-20 NOTE — Progress Notes (Signed)
Lab  Results from 08/19/11  mailed to pt. At home address per her request.

## 2011-08-23 ENCOUNTER — Encounter (HOSPITAL_COMMUNITY): Payer: Self-pay

## 2011-08-23 ENCOUNTER — Encounter (HOSPITAL_COMMUNITY)
Admission: RE | Admit: 2011-08-23 | Discharge: 2011-08-23 | Disposition: A | Discharge status: Home / Self Care | Payer: Medicare Other | Source: Ambulatory Visit | Attending: Surgery | Admitting: Surgery

## 2011-08-23 DIAGNOSIS — Z853 Personal history of malignant neoplasm of breast: Secondary | ICD-10-CM | POA: Diagnosis not present

## 2011-08-23 DIAGNOSIS — R920 Mammographic microcalcification found on diagnostic imaging of breast: Secondary | ICD-10-CM | POA: Diagnosis not present

## 2011-08-23 DIAGNOSIS — E119 Type 2 diabetes mellitus without complications: Secondary | ICD-10-CM | POA: Diagnosis not present

## 2011-08-23 DIAGNOSIS — I509 Heart failure, unspecified: Secondary | ICD-10-CM | POA: Diagnosis not present

## 2011-08-23 DIAGNOSIS — I10 Essential (primary) hypertension: Secondary | ICD-10-CM | POA: Diagnosis not present

## 2011-08-23 DIAGNOSIS — R51 Headache: Secondary | ICD-10-CM | POA: Diagnosis not present

## 2011-08-23 DIAGNOSIS — Z9889 Other specified postprocedural states: Secondary | ICD-10-CM

## 2011-08-23 DIAGNOSIS — IMO0001 Reserved for inherently not codable concepts without codable children: Secondary | ICD-10-CM | POA: Diagnosis not present

## 2011-08-23 HISTORY — DX: Interstitial cystitis (chronic) without hematuria: N30.10

## 2011-08-23 HISTORY — DX: Anxiety disorder, unspecified: F41.9

## 2011-08-23 LAB — SURGICAL PCR SCREEN
MRSA, PCR: NEGATIVE
Staphylococcus aureus: NEGATIVE

## 2011-08-23 MED ORDER — CHLORHEXIDINE GLUCONATE 4 % EX LIQD: 1.0000 "application " | Freq: Once | CUTANEOUS | Status: DC

## 2011-08-23 NOTE — Pre-Procedure Instructions (Addendum)
20 [name]  08/23/2011   Your procedure is scheduled on:  08/26/11  Report to [facility] at 7:30 AM.  Call this number if you have problems the morning of surgery: [phone]   Remember: Discontinue Aspirin, Coumadin, Plavix, Effient and herbal medications.    Do not eat food:After Midnight.  May have clear liquids: up to 4 Hours before arrival.  Clear liquids include soda, tea, black coffee, apple or grape juice, broth.  Take these medicines the morning of surgery with A SIP OF WATER: Coreg, Cymbalta, Elmiron, Femara, Ativan, Robaxin, Percocet, Lyrica, Aciphex    Do not wear jewelry, make-up or nail polish.  Do not wear lotions, powders, or perfumes. You may wear deodorant.  Do not shave 48 hours prior to surgery.  Do not bring valuables to the hospital.  Contacts, dentures or bridgework may not be worn into surgery.  Leave suitcase in the car. After surgery it may be brought to your room.  For patients admitted to the hospital, checkout time is 11:00 AM the day of discharge.   Patients discharged the day of surgery will not be allowed to drive home.  Name and phone number of your driver: Shirlee Limerick spouse cell [phone]  Special Instructions: CHG Shower Use Special Wash: 1/2 bottle night before surgery and 1/2 bottle morning of surgery.   Please read over the following fact sheets that you were given: Pain Booklet, Coughing and Deep Breathing, MRSA Information and Surgical Site Infection Prevention

## 2011-08-23 NOTE — Progress Notes (Signed)
Spoke with Shonna Chock PA about patient's hx of CHF and last cardiac testing. Will have her review chart. Patient denied SOB and chest pain at this time. Revonda Standard okay with using labs from 08/19/11 (same as MD ordered for surgery)-labs okay. CXR and EKG from 07/12/11 in EPIC okay.   Sherie Don NT will call [facility] about Sleep Study patient states was done about 2-3 wks ago. States she called about a week ago and MD had not read it yet.

## 2011-08-24 ENCOUNTER — Encounter (HOSPITAL_COMMUNITY): Payer: Self-pay | Admitting: Vascular Surgery

## 2011-08-24 NOTE — Progress Notes (Signed)
[name]  [name] [id number] June 23, 1970 41 y.o. 08/24/2011 5:22 PM PCP  1. Principle Diagnosis: 2.  _______Locally advanced ___ breast cancer, status post 4 cycles of dose-dense AC and Avastin, status post 8 cycles of weekly Taxol and q.3-week Avastin, last treated __with________ chemotherapy in 2008.  3. History of chemotherapy-induced neuropathy.  4. History of depression.  5. History of amenorrhea, status post hysterectomy, currently on Femara.  6. History of chemotherapy-induced cardiomyopathy, on regular followup.   Interim History:   Since being seen last [name]  has had issues with stuttering and other neurological problems. She has had extensive workup including EEG and MRI scans of the brain. There has  not been any evidence of any abnormalities ie  malignancy. Her cardiologist is also recommended defibrillator placement. She is also had a recent mammogram an abnormality has been seen in the right side requiring further biopsies. 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 She is clearly been troubled by all these developments. From a cardiac point of view her EF continues to be low. And she is symptomatic with ongoing fatigue. Medications: I have reviewed the patient's current medications.  Allergies:  Allergies  Allergen Reactions  . Reglan (Metoclopramide Hcl) Anxiety    Past Medical History, Surgical history, Social history, and Family History were reviewed and updated.  Review of Systems: Constitutional:  Negative for fever, chills, night sweats, anorexia, weight loss, pain. Cardiovascular: no chest pain, she does have dyspnea on exertion with decreased exercise tolerance Respiratory: negative Neurological: negative Dermatological: negative ENT: negative Skin Gastrointestinal: no abdominal pain, change in bowel habits, or black or bloody stools Genito-Urinary: negative Hematological and Lymphatic:  negative Breast: negative Musculoskeletal: negative Remaining ROS negative.  Physical Exam: Blood pressure 93/60, pulse 97, temperature 98.5 F (36.9 C), temperature source Oral, height 5\' 7"  (1.702 m), weight 249 lb 4.8 oz (113.082 kg). ECOG: 0 General appearance: alert, cooperative and appears stated age Head: Normocephalic, without obvious abnormality, atraumatic Neck: no adenopathy, no carotid bruit, no JVD, supple, symmetrical, trachea midline and thyroid not enlarged, symmetric, no tenderness/mass/nodules Lymph nodes: Cervical, supraclavicular, and axillary nodes normal. Cardiac : regular rate and rhythm, no murmurs or gallops Pulmonary:clear to auscultation bilaterally and normal percussion bilaterally Breasts: inspection negative, no nipple discharge or bleeding, no masses or nodularity palpable Abdomen:soft, non-tender; bowel sounds normal; no masses,  no organomegaly Extremities negative Neuro: She does have some stuttering. Her gross neurological function appears to be intact. Lab Results: Lab Results  Component Value Date   WBC 8.1 08/19/2011   HGB 11.6 08/19/2011   HCT 35.3 08/19/2011   MCV 87.4 08/19/2011   PLT 250 08/19/2011     Chemistry      Component Value Date/Time   NA 137 08/19/2011 1054   K 5.1 08/19/2011 1054   CL 106 08/19/2011 1054   CO2 23 08/19/2011 1054   BUN 10 08/19/2011 1054   CREATININE 0.75 08/19/2011 1054      Component Value Date/Time   CALCIUM 9.5 08/19/2011 1054   ALKPHOS 88 08/19/2011 1054   AST 16 08/19/2011 1054   ALT 18 08/19/2011 1054   BILITOT 0.3 08/19/2011 1054      .pathology. Radiological Studies: chest X-ray n/a Mammogram  Suspicious right upper outer quadrant far posterior calcifications  at the lumpectomy site, concerning for recurrence versus fat  necrosis. The patient is not a candidate for stereotactic core  needle biopsy due to the far posterior location.  Surgical excision  is recommended, and surgical consultation will be arranged at the   patient's convenience. Findings and recommendations discussed with  the patient and provided in written form at the time of the exam.  BI-RADS CATEGORY 4: Suspicious abnormality - biopsy should be  considered.  Bone density   Impression and Plan: Unfortunate 41 year old woman with history of breast cancer and now ongoing problems related to cardiomyopathy. There is implanted defibrillator placement as soon as definitive biopsy can be taken of the breast and assessment and MA. Fell to 2 step procedure might be important i.e. a biopsy first so followed by likely mastectomy. In view of this I've also recommended that the patient stop her AI therapy as this may be contributing to her symptoms of fatigue etc. I will see her in a month's time to see how she's doing  More than 50% of the Roy was spent in patient-related counselling   Krystal Crane, MD 3/12/20135:22 PM

## 2011-08-24 NOTE — Consult Note (Addendum)
Anesthesia:  Patient is a 41 year old female scheduled for a right breast lumpectomy for microcalcification on 08/26/11.  History includes HTN, CHF, chemo-related non-ischemic CM (EF 30% 05/2011), blood transfusion, anxiety, HTN, obesity, GERD, fibromyalgia, headaches, dizziness, depression, DM2, prior right lumpectomy in 2008 for breast CA, chemo induced peripheral neuropathy. PCP Dr. Maurice Small.  She has recently been evaluated by Neurologist Dr. Vickey Huger for new onset stuttering 1-2 months ago.  Her Cardiologist is Dr. Gala Romney.  He has discussed the possibility of an ICD, but was waiting until she completed a Neurology work-up by Dr. Vickey Huger.  Patient did notify Dr. Gala Romney of her upcoming procedure, and he felt she was stable to proceed (see Notes tab).  When her ICM was initially diagnosed her EF was 25%.  She later had a MUGA  Scan on 07/14/09 that showed her EF up to 58%.  However she since had an echo, cath, and cardiac MRI showing another decline in her EF.  Cardiac cath in 12/25/10 showed normal coronaries, sever LV dysfunction with EF 20-25%, normal filling pressures.  Echo from 06/01/11 showed: - Left ventricle: The cavity size was mildly dilated. Wall thickness was normal. Systolic function was severely reduced. The estimated ejection fraction was 30%. Diffuse hypokinesis. Features are consistent with a pseudonormal left ventricular filling pattern, with concomitant abnormal relaxation and increased filling pressure (grade 2 diastolic dysfunction). Ejection fraction: 31% (MOD, 2-plane). - Aortic valve: There was no stenosis. Trivial regurgitation. - Mitral valve: Mild regurgitation. - Left atrium: The atrium was mildly dilated. - Right ventricle: The cavity size was normal. Systolic function was normal. Lateral annulus peak S velocity: 13.2cm/s. - Pulmonary arteries: No complete TR doppler jet so unable to estimate PA systolic pressure. - Inferior vena cava: The vessel was  normal in size; the respirophasic diameter changes were in the normal range (= 50%); findings are consistent with normal central venous Pressure.  Labs from 08/19/11 noted.  CXR from 07/12/11 showed no active disease.  I have requested records from [facility], but they are still pending.  According to [name], she has had a normal brain MRI and EEG.  Apparently Conversion Disorder is being considered for her new on-set stuttering.  I'll follow-up with her Neurology records once available.  Based on the information I have currently would anticipate that she is okay to proceed.  Addendum: 08/25/11 1230  I have reviewed her latest Neurology records.  Brain MRI on 08/04/11 was normal.  EEG final report is still pending (patient denied history of seizures).  A sleep study was also done on 08/06/11 was also done but showed no evidence of OSA.

## 2011-08-24 NOTE — Anesthesia Preprocedure Evaluation (Addendum)
Anesthesia Evaluation   Patient awake    Reviewed: Allergy & Precautions, H&P , NPO status , Patient's Chart, lab work & pertinent test results  History of Anesthesia Complications (+) AWARENESS UNDER ANESTHESIA  Airway Mallampati: II TM Distance: >3 FB Neck ROM: Full    Dental   Pulmonary    Pulmonary exam normal       Cardiovascular hypertension, +CHF and + Orthopnea Rhythm:Irregular Rate:Normal  Non-ischemic CM, EF 30%   Neuro/Psych  Headaches,  Neuromuscular disease    GI/Hepatic GERD-  ,  Endo/Other  Diabetes mellitus-  Renal/GU      Musculoskeletal  (+) Fibromyalgia -  Abdominal (+) + obese,   Peds  Hematology   Anesthesia Other Findings   Reproductive/Obstetrics                        Anesthesia Physical Anesthesia Plan  ASA: I  Anesthesia Plan: General   Post-op Pain Management:    Induction: Intravenous  Airway Management Planned: LMA  Additional Equipment:   Intra-op Plan:   Post-operative Plan: Extubation in OR  Informed Consent: I have reviewed the patients History and Physical, chart, labs and discussed the procedure including the risks, benefits and alternatives for the proposed anesthesia with the patient or authorized representative who has indicated his/her understanding and acceptance.     Plan Discussed with: CRNA, Anesthesiologist and Surgeon  Anesthesia Plan Comments:         Anesthesia Quick Evaluation

## 2011-08-24 NOTE — Progress Notes (Signed)
Spoke with Lupita Leash at [facility] and she is going to fax sleep study.

## 2011-08-25 DIAGNOSIS — G4710 Hypersomnia, unspecified: Secondary | ICD-10-CM | POA: Diagnosis not present

## 2011-08-25 DIAGNOSIS — M629 Disorder of muscle, unspecified: Secondary | ICD-10-CM | POA: Diagnosis not present

## 2011-08-25 DIAGNOSIS — I428 Other cardiomyopathies: Secondary | ICD-10-CM | POA: Diagnosis not present

## 2011-08-25 DIAGNOSIS — R4789 Other speech disturbances: Secondary | ICD-10-CM | POA: Diagnosis not present

## 2011-08-25 MED ORDER — CEFAZOLIN SODIUM-DEXTROSE 2-3 GM-% IV SOLR
2.0000 g | INTRAVENOUS | Status: AC
  Administered 2011-08-26: 2 g via INTRAVENOUS
  Filled 2011-08-25: qty 50

## 2011-08-26 ENCOUNTER — Ambulatory Visit (HOSPITAL_COMMUNITY): Payer: Medicare Other | Admitting: Vascular Surgery

## 2011-08-26 ENCOUNTER — Ambulatory Visit
Admission: RE | Admit: 2011-08-26 | Discharge: 2011-08-26 | Disposition: A | Discharge status: Home / Self Care | Payer: Medicare Other | Source: Ambulatory Visit | Attending: Surgery | Admitting: Surgery

## 2011-08-26 ENCOUNTER — Encounter (HOSPITAL_COMMUNITY): Payer: Self-pay | Admitting: Vascular Surgery

## 2011-08-26 ENCOUNTER — Encounter (HOSPITAL_COMMUNITY)
Admission: RE | Disposition: A | Discharge status: Home / Self Care | Payer: Self-pay | Source: Ambulatory Visit | Attending: Surgery

## 2011-08-26 ENCOUNTER — Ambulatory Visit (HOSPITAL_COMMUNITY)
Admission: RE | Admit: 2011-08-26 | Discharge: 2011-08-26 | Disposition: A | Discharge status: Home / Self Care | Payer: Medicare Other | Source: Ambulatory Visit | Attending: Surgery | Admitting: Surgery

## 2011-08-26 ENCOUNTER — Encounter (HOSPITAL_COMMUNITY): Payer: Self-pay | Admitting: Surgery

## 2011-08-26 ENCOUNTER — Encounter (HOSPITAL_COMMUNITY): Payer: Self-pay | Admitting: *Deleted

## 2011-08-26 DIAGNOSIS — Z853 Personal history of malignant neoplasm of breast: Secondary | ICD-10-CM | POA: Insufficient documentation

## 2011-08-26 DIAGNOSIS — N6489 Other specified disorders of breast: Secondary | ICD-10-CM | POA: Diagnosis not present

## 2011-08-26 DIAGNOSIS — I951 Orthostatic hypotension: Secondary | ICD-10-CM

## 2011-08-26 DIAGNOSIS — R920 Mammographic microcalcification found on diagnostic imaging of breast: Secondary | ICD-10-CM

## 2011-08-26 DIAGNOSIS — R42 Dizziness and giddiness: Secondary | ICD-10-CM

## 2011-08-26 DIAGNOSIS — I10 Essential (primary) hypertension: Secondary | ICD-10-CM | POA: Insufficient documentation

## 2011-08-26 DIAGNOSIS — R0602 Shortness of breath: Secondary | ICD-10-CM

## 2011-08-26 DIAGNOSIS — I5023 Acute on chronic systolic (congestive) heart failure: Secondary | ICD-10-CM

## 2011-08-26 DIAGNOSIS — E663 Overweight: Secondary | ICD-10-CM

## 2011-08-26 DIAGNOSIS — R209 Unspecified disturbances of skin sensation: Secondary | ICD-10-CM

## 2011-08-26 DIAGNOSIS — K219 Gastro-esophageal reflux disease without esophagitis: Secondary | ICD-10-CM

## 2011-08-26 DIAGNOSIS — IMO0001 Reserved for inherently not codable concepts without codable children: Secondary | ICD-10-CM | POA: Diagnosis not present

## 2011-08-26 DIAGNOSIS — I429 Cardiomyopathy, unspecified: Secondary | ICD-10-CM

## 2011-08-26 DIAGNOSIS — R0601 Orthopnea: Secondary | ICD-10-CM

## 2011-08-26 DIAGNOSIS — R079 Chest pain, unspecified: Secondary | ICD-10-CM

## 2011-08-26 DIAGNOSIS — R5381 Other malaise: Secondary | ICD-10-CM

## 2011-08-26 DIAGNOSIS — E119 Type 2 diabetes mellitus without complications: Secondary | ICD-10-CM | POA: Insufficient documentation

## 2011-08-26 DIAGNOSIS — Z9889 Other specified postprocedural states: Secondary | ICD-10-CM

## 2011-08-26 DIAGNOSIS — I5022 Chronic systolic (congestive) heart failure: Secondary | ICD-10-CM

## 2011-08-26 DIAGNOSIS — R05 Cough: Secondary | ICD-10-CM

## 2011-08-26 DIAGNOSIS — R51 Headache: Secondary | ICD-10-CM | POA: Insufficient documentation

## 2011-08-26 DIAGNOSIS — R609 Edema, unspecified: Secondary | ICD-10-CM

## 2011-08-26 DIAGNOSIS — R635 Abnormal weight gain: Secondary | ICD-10-CM

## 2011-08-26 DIAGNOSIS — R262 Difficulty in walking, not elsewhere classified: Secondary | ICD-10-CM

## 2011-08-26 DIAGNOSIS — E559 Vitamin D deficiency, unspecified: Secondary | ICD-10-CM

## 2011-08-26 DIAGNOSIS — I509 Heart failure, unspecified: Secondary | ICD-10-CM | POA: Insufficient documentation

## 2011-08-26 DIAGNOSIS — R002 Palpitations: Secondary | ICD-10-CM

## 2011-08-26 DIAGNOSIS — R531 Weakness: Secondary | ICD-10-CM

## 2011-08-26 LAB — GLUCOSE, CAPILLARY: Glucose-Capillary: 108 mg/dL — ABNORMAL HIGH (ref 70–99)

## 2011-08-26 SURGERY — BREAST LUMPECTOMY WITH NEEDLE LOCALIZATION
Anesthesia: General | Site: Breast | Laterality: Right | Wound class: Clean

## 2011-08-26 MED ORDER — LACTATED RINGERS IV SOLN
INTRAVENOUS | Status: DC | PRN
  Administered 2011-08-26: 10:00:00 via INTRAVENOUS

## 2011-08-26 MED ORDER — BUPIVACAINE-EPINEPHRINE 0.25% -1:200000 IJ SOLN
INTRAMUSCULAR | Status: DC | PRN
  Administered 2011-08-26: 20 mL

## 2011-08-26 MED ORDER — ONDANSETRON HCL 4 MG/2ML IJ SOLN
INTRAMUSCULAR | Status: DC | PRN
  Administered 2011-08-26: 4 mg via INTRAVENOUS

## 2011-08-26 MED ORDER — HYDROMORPHONE HCL PF 1 MG/ML IJ SOLN
0.2500 mg | INTRAMUSCULAR | Status: DC | PRN
  Administered 2011-08-26: 0.5 mg via INTRAVENOUS
  Administered 2011-08-26: 0.25 mg via INTRAVENOUS
  Administered 2011-08-26: 0.5 mg via INTRAVENOUS

## 2011-08-26 MED ORDER — HYDROMORPHONE HCL PF 1 MG/ML IJ SOLN
INTRAMUSCULAR | Status: AC
  Filled 2011-08-26: qty 1

## 2011-08-26 MED ORDER — OXYCODONE-ACETAMINOPHEN 5-325 MG PO TABS: 1.0000 | ORAL_TABLET | ORAL | Status: AC | PRN

## 2011-08-26 MED ORDER — PROPOFOL 10 MG/ML IV EMUL
INTRAVENOUS | Status: DC | PRN
  Administered 2011-08-26: 150 mg via INTRAVENOUS

## 2011-08-26 MED ORDER — ONDANSETRON HCL 4 MG/2ML IJ SOLN: 4.0000 mg | Freq: Once | INTRAMUSCULAR | Status: DC | PRN

## 2011-08-26 MED ORDER — 0.9 % SODIUM CHLORIDE (POUR BTL) OPTIME
TOPICAL | Status: DC | PRN
  Administered 2011-08-26: 1000 mL

## 2011-08-26 MED ORDER — FENTANYL CITRATE 0.05 MG/ML IJ SOLN
INTRAMUSCULAR | Status: DC | PRN
  Administered 2011-08-26: 100 ug via INTRAVENOUS

## 2011-08-26 SURGICAL SUPPLY — 45 items
APPLIER CLIP 9.375 MED OPEN (MISCELLANEOUS)
BINDER BREAST LRG (GAUZE/BANDAGES/DRESSINGS) IMPLANT
BINDER BREAST XLRG (GAUZE/BANDAGES/DRESSINGS) IMPLANT
BLADE SURG 10 STRL SS (BLADE) ×2 IMPLANT
BLADE SURG 15 STRL LF DISP TIS (BLADE) ×1 IMPLANT
BLADE SURG 15 STRL SS (BLADE) ×1
CANISTER SUCTION 2500CC (MISCELLANEOUS) ×2 IMPLANT
CHLORAPREP W/TINT 26ML (MISCELLANEOUS) ×2 IMPLANT
CLIP APPLIE 9.375 MED OPEN (MISCELLANEOUS) IMPLANT
CLOTH BEACON ORANGE TIMEOUT ST (SAFETY) ×2 IMPLANT
COVER SURGICAL LIGHT HANDLE (MISCELLANEOUS) ×2 IMPLANT
DERMABOND ADVANCED (GAUZE/BANDAGES/DRESSINGS) ×1
DERMABOND ADVANCED .7 DNX12 (GAUZE/BANDAGES/DRESSINGS) ×1 IMPLANT
DEVICE DUBIN SPECIMEN MAMMOGRA (MISCELLANEOUS) ×2 IMPLANT
DRAPE CHEST BREAST 15X10 FENES (DRAPES) ×2 IMPLANT
ELECT CAUTERY BLADE 6.4 (BLADE) ×2 IMPLANT
ELECT REM PT RETURN 9FT ADLT (ELECTROSURGICAL) ×2
ELECTRODE REM PT RTRN 9FT ADLT (ELECTROSURGICAL) ×1 IMPLANT
GLOVE BIO SURGEON STRL SZ7 (GLOVE) ×2 IMPLANT
GLOVE BIO SURGEON STRL SZ8 (GLOVE) ×2 IMPLANT
GLOVE BIOGEL PI IND STRL 7.0 (GLOVE) ×1 IMPLANT
GLOVE BIOGEL PI IND STRL 8 (GLOVE) ×1 IMPLANT
GLOVE BIOGEL PI INDICATOR 7.0 (GLOVE) ×1
GLOVE BIOGEL PI INDICATOR 8 (GLOVE) ×1
GOWN STRL NON-REIN LRG LVL3 (GOWN DISPOSABLE) ×4 IMPLANT
KIT BASIN OR (CUSTOM PROCEDURE TRAY) ×2 IMPLANT
KIT MARKER MARGIN INK (KITS) IMPLANT
KIT ROOM TURNOVER OR (KITS) ×2 IMPLANT
NEEDLE 18GX1X1/2 (RX/OR ONLY) (NEEDLE) ×2 IMPLANT
NEEDLE HYPO 25GX1X1/2 BEV (NEEDLE) ×4 IMPLANT
NS IRRIG 1000ML POUR BTL (IV SOLUTION) ×2 IMPLANT
PACK SURGICAL SETUP 50X90 (CUSTOM PROCEDURE TRAY) ×2 IMPLANT
PAD ARMBOARD 7.5X6 YLW CONV (MISCELLANEOUS) ×2 IMPLANT
PENCIL BUTTON HOLSTER BLD 10FT (ELECTRODE) ×2 IMPLANT
SPONGE LAP 18X18 X RAY DECT (DISPOSABLE) ×2 IMPLANT
SUT MON AB 4-0 PC3 18 (SUTURE) ×2 IMPLANT
SUT SILK 2 0 SH (SUTURE) IMPLANT
SUT VIC AB 3-0 SH 27 (SUTURE) ×1
SUT VIC AB 3-0 SH 27XBRD (SUTURE) ×1 IMPLANT
SYR BULB 3OZ (MISCELLANEOUS) ×2 IMPLANT
SYR CONTROL 10ML LL (SYRINGE) ×4 IMPLANT
TOWEL OR 17X24 6PK STRL BLUE (TOWEL DISPOSABLE) ×2 IMPLANT
TOWEL OR 17X26 10 PK STRL BLUE (TOWEL DISPOSABLE) ×2 IMPLANT
TUBE CONNECTING 12X1/4 (SUCTIONS) ×2 IMPLANT
YANKAUER SUCT BULB TIP NO VENT (SUCTIONS) ×2 IMPLANT

## 2011-08-26 NOTE — Anesthesia Procedure Notes (Signed)
Procedure Name: LMA Insertion Date/Time: 08/26/2011 10:27 AM Performed by: Gwenyth Allegra Pre-anesthesia Checklist: Patient identified, Timeout performed, Emergency Drugs available, Suction available and Patient being monitored Patient Re-evaluated:Patient Re-evaluated prior to inductionOxygen Delivery Method: Circle system utilized Preoxygenation: Pre-oxygenation with 100% oxygen Intubation Type: IV induction Ventilation: Mask ventilation without difficulty LMA: LMA inserted LMA Size: 4.0 Placement Confirmation: positive ETCO2 and breath sounds checked- equal and bilateral Tube secured with: Tape

## 2011-08-26 NOTE — Anesthesia Postprocedure Evaluation (Signed)
  Anesthesia Post-op Note  Patient: [name]  Procedure(s) Performed: Procedure(s) (LRB): BREAST LUMPECTOMY WITH NEEDLE LOCALIZATION (Right)  Patient Location: PACU  Anesthesia Type: General  Level of Consciousness: awake, alert , oriented and patient cooperative  Airway and Oxygen Therapy: Patient Spontanous Breathing and Patient connected to nasal cannula oxygen  Post-op Pain: mild  Post-op Assessment: Post-op Vital signs reviewed, Patient's Cardiovascular Status Stable, Respiratory Function Stable, Patent Airway, No signs of Nausea or vomiting and Pain level controlled  Post-op Vital Signs: stable  Complications: No apparent anesthesia complications

## 2011-08-26 NOTE — H&P (View-Only) (Signed)
Patient ID: Clelia Croft, female   DOB: 04-30-1971, 41 y.o.   MRN: [id number]  Chief Complaint  Patient presents with  . Breast Problem    est pt new prob- eval R breast calcifications    HPI SARANN TREGRE is a 41 y.o. female.   HPI The patient presents at the request of Dr. Valentina Lucks do 2 microcalcifications in her right breast. She underwent lumpectomy on the right in 2008 by Dr. Griffin Basil for breast cancer. She has developed new calcifications in the right breast near the lumpectomy bed better to deep to undergo core biopsy. Excision biopsy recommended by the radiologist. She denies any history of breast pain, breast mass and relates shrinking of the right breast after radiation therapy. She also received chemotherapy for this in 2008.  Past Medical History  Diagnosis Date  . 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 Breast cancer     s/p mastectomy and chemotherapy, last on september 2008  . Peripheral neuropathy     chemo- induced  . Hypertension     c/b orthostatic hypotetion  . Depression   . Palpitation     normal sinus rythm only on 21 day heart monitor  . Cough   . Obesity   . Dizziness   . Fibromyalgia   . Diabetes mellitus   . Anemia   . Blood transfusion   . GERD (gastroesophageal reflux disease)   . Heart murmur   . Breast calcifications     right breast    Past Surgical History  Procedure Date  . Abdominal hysterectomy   . Laparoscopic endometriosis fulguration   . Breast surgery     rt breast lumpectomy    Family History  Problem Relation Age of Onset  . Coronary artery disease    . Heart attack    . Heart disease Mother   . Heart disease Maternal Uncle   . Cancer Paternal Uncle     colon  . Heart disease Maternal Grandmother   . Cancer Paternal Grandmother     ovarian    Social History History  Substance Use Topics  . Smoking status: Never  Smoker   . Smokeless tobacco: Not on file  . Alcohol Use: Yes    Allergies  Allergen Reactions  . Reglan (Metoclopramide Hcl)     Current Outpatient Prescriptions  Medication Sig Dispense Refill  . b complex vitamins tablet Take 1 tablet by mouth daily.        . beta carotene w/minerals (OCUVITE) tablet Take 1 tablet by mouth daily.      . carvedilol (COREG) 12.5 MG tablet Take 1.5 tablets (18.75 mg total) by mouth 2 (two) times daily with a meal.  90 tablet  6  . Cholecalciferol (VITAMIN D3) 5000 UNITS CAPS Take 2 capsules by mouth daily.       . Coenzyme Q10 (CO Q-10 PO) Take 100 mg by mouth daily.       . Cyanocobalamin 2500 MCG SUBL Place 2,500 mcg under the tongue daily.      . DULoxetine (CYMBALTA) 60 MG capsule Take 60 mg by mouth daily.      Marland Kitchen ELMIRON 100 MG capsule Take 100 mg by mouth Twice daily.      Marland Kitchen letrozole (FEMARA) 2.5 MG tablet Take 2.5 mg by mouth daily.      Marland Kitchen lisinopril (PRINIVIL,ZESTRIL) 10 MG tablet  Take 10 mg by mouth Twice daily.      Marland Kitchen LORazepam (ATIVAN) 1 MG tablet Take 1 mg by mouth 2 (two) times daily.        . Meth-Hyo-M Bl-Na Phos-Ph Sal (URIBEL) 118 MG CAPS Take 118 mg by mouth 2 (two) times daily.      . methocarbamol (ROBAXIN) 750 MG tablet Take 750 mg by mouth 2 (two) times daily.       . Multiple Vitamins-Minerals (CENTRUM SILVER ULTRA WOMENS PO) Take 1 tablet by mouth daily.        Marland Kitchen oxyCODONE-acetaminophen (PERCOCET) 5-325 MG per tablet Take 1 tablet by mouth 4 (four) times daily. 4 times daily      . potassium chloride SA (K-DUR,KLOR-CON) 20 MEQ tablet Take 20 mEq by mouth daily as needed. To be taken with fluid pill      . pregabalin (LYRICA) 200 MG capsule Take 600 mg by mouth at bedtime.       . RABEprazole (ACIPHEX) 20 MG tablet Take 20 mg by mouth daily.        Marland Kitchen spironolactone (ALDACTONE) 25 MG tablet Take 12.5 mg by mouth daily.      Marland Kitchen torsemide (DEMADEX) 20 MG tablet Take 20 mg by mouth daily as needed. For fluid retention; to be taken  with Potassium      . zolpidem (AMBIEN) 10 MG tablet Take 10 mg by mouth at bedtime as needed. For insomnia        Review of Systems Review of Systems  Constitutional: Negative for fever, chills and unexpected weight change.  HENT: Negative for hearing loss, congestion, sore throat, trouble swallowing and voice change.   Eyes: Negative for visual disturbance.  Respiratory: Negative for cough and wheezing.   Cardiovascular: Negative for chest pain, palpitations and leg swelling.  Gastrointestinal: Negative for nausea, vomiting, abdominal pain, diarrhea, constipation, blood in stool, abdominal distention and anal bleeding.  Genitourinary: Negative for hematuria, vaginal bleeding and difficulty urinating.  Musculoskeletal: Negative for arthralgias.  Skin: Negative for rash and wound.  Neurological: Negative for seizures, syncope and headaches.  Hematological: Negative for adenopathy. Does not bruise/bleed easily.  Psychiatric/Behavioral: Negative for confusion.    Blood pressure 92/58, pulse 84, temperature 97.3 F (36.3 C), temperature source Temporal, resp. rate 18, height 5\' 7"  (1.702 m), weight 243 lb 6.4 oz (110.406 kg).  Physical Exam Physical Exam  Constitutional: She appears well-developed and well-nourished.  HENT:  Head: Normocephalic and atraumatic.  Eyes: EOM are normal. Pupils are equal, round, and reactive to light.  Neck: Normal range of motion. Neck supple.  Cardiovascular: Normal rate and regular rhythm.   Pulmonary/Chest: Effort normal and breath sounds normal.  Abdominal: Soft. Bowel sounds are normal.  Skin:     Psychiatric: Her speech is normal and behavior is normal. Judgment and thought content normal. Her mood appears anxious. Cognition and memory are normal.    Data Reviewed Mammogram shows cluster right breast microcalcifications upper-outer quadrant near lumpectomy bed.  Assessment    History of right breast cancer status post breast conservation  and postop chemotherapy 2008 Dr. Maple Hudson with new calcifications adjacent to right lumpectomy bed to deep for needle biopsy    Plan    Recommend right breast needle localized excisional biopsy for further evaluation. Risk of bleeding, infection and the need for further surgical procedures discussed. She does have a cardiac history but looks like a stable from review of notes from Dr. Clarise Cruz.  Savoy Somerville A. 08/16/2011, 10:50 AM

## 2011-08-26 NOTE — Op Note (Signed)
Breast Lumpectomy Right With Needle Localization  Indications: This patient presents with history of a right breast mass. Given the clinical history and physical exam, along with indicated diagnostic studies, breast biopsy will be performed.  Pre-operative Diagnosis: right breast mass and calcifications  Post-operative Diagnosis: right breast mass and calcifications  Surgeon: Nidhi Jacome A.   Assistants: OR staff  Anesthesia: General LMA anesthesia and Local anesthesia 0.25.% bupivacaine, with epinephrine  ASA Class: 3  Procedure Details  The patient was seen in the Holding Room. The risks, benefits, complications, treatment options, and expected outcomes were discussed with the patient. The possibilities of reaction to medication, pulmonary aspiration, bleeding, infection, the need for additional procedures, failure to diagnose a condition, and creating a complication requiring transfusion or operation were discussed with the patient. The patient concurred with the proposed plan, giving informed consent. The site of surgery properly noted/marked. The patient underwent right breast wire localization at [facility]. She had a history of right breast cancer and the new microcalcifications were too deep to core biopsy. The patient was taken to Operating Room, identified as Clelia Croft, and the procedure verified as lumpectomy. A Time Out was held and the above information confirmed.  After induction of anesthesia, the right breast and chest were prepped and draped in standard fashion. The lumpectomy was performed by creating an oblique incision over the upper outer quadrant of the breast around the wire. All tissue around the wire was excised and additional inferior margin removed.  Radiograph revealed the wire and calcifications to be in the specimen.  Hemostasis acheived .  Additional tissue was taken along the inferior margin and submitted separately to pathology after providing orientation for the  pathology. The wound was irrigated and closed with a 3-0 Vicryl and 4 O monocryl. subcuticular closure in layers.    Sterile dressings were applied. At the end of the operation, all sponge, instrument, and needle counts were correct.  Findings: grossly clear surgical margins  Estimated Blood Loss:  less than 50 mL         Drains: none         Total IV Fluids:  1000 mL         Specimens: breast mass  right           Complications:  None; patient tolerated the procedure well.         Disposition: PACU - hemodynamically stable.         Condition: Stable

## 2011-08-26 NOTE — Preoperative (Signed)
Beta Blockers   Reason not to administer Beta Blockers:Not Applicable 

## 2011-08-26 NOTE — Interval H&P Note (Signed)
History and Physical Interval Note:  08/26/2011 9:55 AM  [name]  has presented today for surgery, with the diagnosis of right breast multicalcifications   The various methods of treatment have been discussed with the patient and family. After consideration of risks, benefits and other options for treatment, the patient has consented to  Procedure(s) (LRB): BREAST LUMPECTOMY WITH NEEDLE LOCALIZATION (Right) as a surgical intervention .  The patients' history has been reviewed, patient examined, no change in status, stable for surgery.  I have reviewed the patients' chart and labs.  Questions were answered to the patient's satisfaction.     Zetha Kuhar A.

## 2011-08-26 NOTE — Discharge Instructions (Signed)
[facility] °Office Phone Number [phone] ° °BREAST BIOPSY/ PARTIAL MASTECTOMY: POST OP INSTRUCTIONS ° °Always review your discharge instruction sheet given to you by the facility where your surgery was performed. ° °IF YOU HAVE DISABILITY OR FAMILY LEAVE FORMS, YOU MUST BRING THEM TO THE OFFICE FOR PROCESSING.  DO NOT GIVE THEM TO YOUR DOCTOR. ° °1. A prescription for pain medication may be given to you upon discharge.  Take your pain medication as prescribed, if needed.  If narcotic pain medicine is not needed, then you may take acetaminophen (Tylenol) or ibuprofen (Advil) as needed. °2. Take your usually prescribed medications unless otherwise directed °3. If you need a refill on your pain medication, please contact your pharmacy.  They will contact our office to request authorization.  Prescriptions will not be filled after 5pm or on week-ends. °4. You should eat very light the first 24 hours after surgery, such as soup, crackers, pudding, etc.  Resume your normal diet the day after surgery. °5. Most patients will experience some swelling and bruising in the breast.  Ice packs and a good support bra will help.  Swelling and bruising can take several days to resolve.  °6. It is common to experience some constipation if taking pain medication after surgery.  Increasing fluid intake and taking a stool softener will usually help or prevent this problem from occurring.  A mild laxative (Milk of Magnesia or Miralax) should be taken according to package directions if there are no bowel movements after 48 hours. °7. Unless discharge instructions indicate otherwise, you may remove your bandages 24-48 hours after surgery, and you may shower at that time.  You may have steri-strips (small skin tapes) in place directly over the incision.  These strips should be left on the skin for 7-10 days.  If your surgeon used skin glue on the incision, you may shower in 24 hours.  The glue will flake off over the  next 2-3 weeks.  Any sutures or staples will be removed at the office during your follow-up visit. °8. ACTIVITIES:  You may resume regular daily activities (gradually increasing) beginning the next day.  Wearing a good support bra or sports bra minimizes pain and swelling.  You may have sexual intercourse when it is comfortable. °a. You may drive when you no longer are taking prescription pain medication, you can comfortably wear a seatbelt, and you can safely maneuver your car and apply brakes. °b. RETURN TO WORK:  ______________________________________________________________________________________ °9. You should see your doctor in the office for a follow-up appointment approximately two weeks after your surgery.  Your doctor’s nurse will typically make your follow-up appointment when she calls you with your pathology report.  Expect your pathology report 2-3 business days after your surgery.  You may call to check if you do not hear from us after three days. °10. OTHER INSTRUCTIONS: _______________________________________________________________________________________________ _____________________________________________________________________________________________________________________________________ °_____________________________________________________________________________________________________________________________________ °_____________________________________________________________________________________________________________________________________ ° °WHEN TO CALL YOUR DOCTOR: °1. Fever over 101.0 °2. Nausea and/or vomiting. °3. Extreme swelling or bruising. °4. Continued bleeding from incision. °5. Increased pain, redness, or drainage from the incision. ° °The clinic staff is available to answer your questions during regular business hours.  Please don’t hesitate to call and ask to speak to one of the nurses for clinical concerns.  If you have a medical emergency, go to the nearest  emergency room or call 911.  A surgeon from [facility] is always on call at the hospital. ° °For further questions, please visit [email or web address]  °

## 2011-08-26 NOTE — Transfer of Care (Signed)
Immediate Anesthesia Transfer of Care Note  Patient: [name]  Procedure(s) Performed: Procedure(s) (LRB): BREAST LUMPECTOMY WITH NEEDLE LOCALIZATION (Right)  Patient Location: PACU  Anesthesia Type: General  Level of Consciousness: awake and alert   Airway & Oxygen Therapy: Patient Spontanous Breathing and Patient connected to nasal cannula oxygen  Post-op Assessment: Report given to PACU RN and Post -op Vital signs reviewed and stable  Post vital signs: Reviewed and stable  Complications: No apparent anesthesia complications

## 2011-08-30 DIAGNOSIS — G541 Lumbosacral plexus disorders: Secondary | ICD-10-CM | POA: Diagnosis not present

## 2011-08-30 DIAGNOSIS — F341 Dysthymic disorder: Secondary | ICD-10-CM | POA: Diagnosis not present

## 2011-08-30 DIAGNOSIS — F329 Major depressive disorder, single episode, unspecified: Secondary | ICD-10-CM | POA: Diagnosis not present

## 2011-08-30 DIAGNOSIS — F411 Generalized anxiety disorder: Secondary | ICD-10-CM | POA: Diagnosis not present

## 2011-08-31 ENCOUNTER — Encounter (HOSPITAL_COMMUNITY): Payer: Self-pay

## 2011-08-31 ENCOUNTER — Ambulatory Visit (HOSPITAL_COMMUNITY)
Admission: RE | Admit: 2011-08-31 | Discharge: 2011-08-31 | Disposition: A | Discharge status: Home / Self Care | Payer: Medicare Other | Source: Ambulatory Visit | Attending: Internal Medicine | Admitting: Internal Medicine

## 2011-08-31 VITALS — BP 104/66 | HR 96 | Wt 254.0 lb

## 2011-08-31 DIAGNOSIS — R079 Chest pain, unspecified: Secondary | ICD-10-CM | POA: Diagnosis not present

## 2011-08-31 DIAGNOSIS — I5022 Chronic systolic (congestive) heart failure: Secondary | ICD-10-CM | POA: Diagnosis not present

## 2011-08-31 MED ORDER — DIGOXIN 125 MCG PO TABS: 125.0000 ug | ORAL_TABLET | Freq: Every day | ORAL | Status: DC

## 2011-08-31 MED ORDER — CARVEDILOL 25 MG PO TABS: 25.0000 mg | ORAL_TABLET | Freq: Two times a day (BID) | ORAL | Status: DC

## 2011-08-31 NOTE — Progress Notes (Signed)
History of Present Illness: Primary Cardiologist:  Dr. Arvilla Meres Neurlogist: [place]   PCP: Dr. Maurice Small  [name] is a 41 y.o. female with a history of morbid obesity, depression, fibromyalgia, congestive heart failure secondary to nonischemic cardiomyopathy related to her chemotherapy for breast cancer. Last chem 2008. Still in remission.   CPX Feb 2010. Peak VO2 was 14.5 which was 71% of predicted. When corrected for body weight the VO2 was 20.7. The slope was 27. RER 1.20. O2 pulse 73%. Overall this was felt to be only a very mild functional limitation due to her obesity and mild circulatory  CPX 2/11: pVO2 13.0 (72%) correct for ideal wt 65ml/kg/min RER 1.06 (submax) slope 28.2 O2 pulse 91% - felt no signifcant cardiac limitation. + deconditioning.   Echo 04/2009  EF back down to 25%.  RHC normal. Echo 07/2009 showed EF 50%  Echo 06/2010 EF 40%. So we did MUGA EF 58% Echo 12/2010 40-45% 05/2011 EF 30%.  Grade 2 diastolic dysfunction Cath (12/28/10) was set up and demonstrated EF 20-25% and normal coronary arteries.  RA  4, RV 35/11, PA  28/8 (17), PCWP 8. Fick 5.2/2.4 PVR 1.7 Woods. Fick 5.2/2.4 Aortic saturation was 97%.  PA  saturation was 67% and 68%.  Cardiac MRI on 07/19/11: Moderate to severe LVE, Diffuse hypokinesis. EF 32%, Mild LAE  Recently she has had an extensive work-up for stuttering and other neuro abnormalities which was normal. 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 We saw her recently and discussed possible ICD. However, in interim Dr. Donnie Coffin has found another breast mass and she had recent biopsy which showed benign scar tissue.  She returns for routine follow up today. Continues to struggle with stuttering. Says she is under an incredible amount of stress. Not sleeping well. Up at all hours of the night pacing the house.  From HF perspective, would like to proceed with ICD. Having edema and weight up and down. Now  having to take demadex every other day with good response. Gets dyspneic on exertion. Has gained 30 pounds in last 3 months. Eating lots of [employer] and Oreo's - eating almost a bag a day. BP was quite low previously but now getting better. On carvedilol 18.75 bid and lisinopril 10 bid and spiro 12.5.   Past Medical History  Diagnosis Date  . 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 Breast cancer     s/p mastectomy and chemotherapy, last on september 2008  . Peripheral neuropathy     chemo- induced  . Hypertension     c/b orthostatic hypotetion  . Depression   . Palpitation     normal sinus rythm only on 21 day heart monitor  . Cough   . Obesity   . Dizziness   . Fibromyalgia   . Diabetes mellitus   . GERD (gastroesophageal reflux disease)   . Heart murmur   . Breast calcifications     right breast  . Shortness of breath     sometimes if fluid level is up  . Interstitial cystitis   . Blood transfusion     1987 or 1988  . Headache     occasional  . Anxiety   . Anemia     1987-1988  . Nonischemic cardiomyopathy     related to chemo; EF 30% 05/2011  Current Outpatient Prescriptions on File Prior to Encounter  Medication Sig Dispense Refill  . b complex vitamins tablet Take 1 tablet by mouth daily.        . beta carotene w/minerals (OCUVITE) tablet Take 1 tablet by mouth daily.      . carvedilol (COREG) 12.5 MG tablet Take 18.75 mg by mouth 2 (two) times daily with a meal.      . Cholecalciferol (VITAMIN D3) 5000 UNITS CAPS Take 2 capsules by mouth daily.       . clonazePAM (KLONOPIN) 1 MG tablet Take 1 mg by mouth at bedtime as needed. For sleep      . Coenzyme Q10 (CO Q-10 PO) Take 100 mg by mouth daily.       . Cyanocobalamin 2500 MCG SUBL Place 2,500 mcg under the tongue daily.      . DULoxetine (CYMBALTA) 60 MG capsule Take 60 mg by mouth daily.      Marland Kitchen  ELMIRON 100 MG capsule Take 100 mg by mouth Twice daily.      Marland Kitchen letrozole (FEMARA) 2.5 MG tablet Take 2.5 mg by mouth daily.      Marland Kitchen lisinopril (PRINIVIL,ZESTRIL) 10 MG tablet Take 10 mg by mouth Twice daily.      Marland Kitchen LORazepam (ATIVAN) 1 MG tablet Take 1 mg by mouth 2 (two) times daily.        . Meth-Hyo-M Bl-Na Phos-Ph Sal (URIBEL) 118 MG CAPS Take 118 mg by mouth 2 (two) times daily.      . methocarbamol (ROBAXIN) 750 MG tablet Take 750 mg by mouth 2 (two) times daily.       . Multiple Vitamins-Minerals (CENTRUM SILVER ULTRA WOMENS PO) Take 1 tablet by mouth daily.        Marland Kitchen oxyCODONE-acetaminophen (PERCOCET) 5-325 MG per tablet Take 1 tablet by mouth 4 (four) times daily. 4 times daily      . oxyCODONE-acetaminophen (ROXICET) 5-325 MG per tablet Take 1 tablet by mouth every 4 (four) hours as needed for pain.  30 tablet  0  . potassium chloride SA (K-DUR,KLOR-CON) 20 MEQ tablet Take 20 mEq by mouth daily as needed. To be taken with fluid pill      . pregabalin (LYRICA) 200 MG capsule Take 600 mg by mouth at bedtime.       . RABEprazole (ACIPHEX) 20 MG tablet Take 20 mg by mouth daily.        Marland Kitchen spironolactone (ALDACTONE) 25 MG tablet Take 12.5 mg by mouth daily.      Marland Kitchen torsemide (DEMADEX) 20 MG tablet Take 20 mg by mouth daily as needed. For fluid retention; to be taken with Potassium      . zolpidem (AMBIEN) 10 MG tablet Take 10 mg by mouth at bedtime as needed. For insomnia         Allergies  Allergen Reactions  . Reglan (Metoclopramide Hcl) Anxiety   ROS: All pertinent positives or negatives as in HPI otherwise negative   Vital Signs: Filed Vitals:   08/31/11 1200  BP: 104/66  Pulse: 105  Weight: 254 lb (115.214 kg)  SpO2: 98%   PHYSICAL EXAM: Well nourished, well developed, in no acute distress, broken speech HEENT: normal Neck: JVD flat Cardiac:  normal S1, S2; RRR; no murmur, No S3 Lungs:  clear to auscultation bilaterally, no wheezing, rhonchi or rales Abd: obese, soft,  nontender, no hepatomegaly Ext: no edema; RFA site without hematoma or bruit Skin: warm and dry Neuro:  CNs  2-12 intact, no focal abnormalities noted Psych: Normal affect  ASSESSMENT AND PLAN:

## 2011-08-31 NOTE — Patient Instructions (Signed)
Increase Carvedilol to 25 mg Twice daily   Start Digoxin 0.125 mg daily  Your physician recommends that you schedule a follow-up appointment in: 2 months

## 2011-09-03 ENCOUNTER — Telehealth (HOSPITAL_COMMUNITY): Payer: Self-pay | Admitting: *Deleted

## 2011-09-03 NOTE — Telephone Encounter (Signed)
Discussed with patient we will refer her on Monday to EP.  She will await call on Monday.

## 2011-09-03 NOTE — Telephone Encounter (Signed)
[name] called today regarding a defibrillator that Dr Gala Romney was going to refer her for. She was also wondering about her potassium pills, if she can get a dissoluble pill instead.  Please follow up. Thanks.

## 2011-09-07 ENCOUNTER — Telehealth: Payer: Self-pay | Admitting: *Deleted

## 2011-09-07 NOTE — Telephone Encounter (Signed)
left voice message to inform the patient of the new date and time on 09-27-2011 starting at 11:30am

## 2011-09-11 NOTE — Assessment & Plan Note (Addendum)
Stable NYHA III. Volume status looks good. Long talk about role of ICD. She wants to discuss with her family before proceeding. Will increase carvedilol to 25 bid and add digoxin 0.125mg  daily.

## 2011-09-27 ENCOUNTER — Ambulatory Visit: Payer: Medicare Other | Admitting: Oncology

## 2011-09-27 ENCOUNTER — Other Ambulatory Visit: Payer: Medicare Other | Admitting: Lab

## 2011-09-29 DIAGNOSIS — F329 Major depressive disorder, single episode, unspecified: Secondary | ICD-10-CM | POA: Diagnosis not present

## 2011-09-29 DIAGNOSIS — F341 Dysthymic disorder: Secondary | ICD-10-CM | POA: Diagnosis not present

## 2011-09-29 DIAGNOSIS — F411 Generalized anxiety disorder: Secondary | ICD-10-CM | POA: Diagnosis not present

## 2011-09-29 DIAGNOSIS — G541 Lumbosacral plexus disorders: Secondary | ICD-10-CM | POA: Diagnosis not present

## 2011-09-30 ENCOUNTER — Ambulatory Visit: Payer: Medicare Other | Admitting: Oncology

## 2011-10-01 ENCOUNTER — Other Ambulatory Visit: Payer: Medicare Other

## 2011-10-01 ENCOUNTER — Ambulatory Visit: Payer: Medicare Other | Admitting: Oncology

## 2011-10-01 DIAGNOSIS — F4321 Adjustment disorder with depressed mood: Secondary | ICD-10-CM | POA: Diagnosis not present

## 2011-10-05 ENCOUNTER — Encounter: Payer: Self-pay | Admitting: Internal Medicine

## 2011-10-05 ENCOUNTER — Encounter: Payer: Self-pay | Admitting: *Deleted

## 2011-10-05 ENCOUNTER — Ambulatory Visit (INDEPENDENT_AMBULATORY_CARE_PROVIDER_SITE_OTHER): Payer: Medicare Other | Admitting: Internal Medicine

## 2011-10-05 VITALS — BP 100/56 | HR 88 | Ht 67.0 in | Wt 258.1 lb

## 2011-10-05 DIAGNOSIS — I5022 Chronic systolic (congestive) heart failure: Secondary | ICD-10-CM

## 2011-10-05 DIAGNOSIS — I429 Cardiomyopathy, unspecified: Secondary | ICD-10-CM

## 2011-10-05 DIAGNOSIS — R002 Palpitations: Secondary | ICD-10-CM

## 2011-10-05 DIAGNOSIS — E663 Overweight: Secondary | ICD-10-CM | POA: Diagnosis not present

## 2011-10-05 NOTE — Assessment & Plan Note (Signed)
Is not clear what is the mechanism of these. As noted she has a recollection of the idea of atrial fibrillation

## 2011-10-05 NOTE — Assessment & Plan Note (Signed)
Patient has a persistent nonischemic cardiomyopathy attributed to chemotherapy. Her ejection fraction has remained under 35% for more than a year despite guidelines directed medical therapy. She also has congestive heart failure class 2B-3A  She is appropriately considered for ICD implantation for primary prevention at this time.Have reviewed the potential benefits and risks of ICD implantation including but not limited to death, perforation of heart or lung, lead dislodgement, infection,  device malfunction and inappropriate shocks.  The patient and family*express understanding  and are willing to proceed.

## 2011-10-05 NOTE — Assessment & Plan Note (Signed)
She raised the issue of lap banding. I suspect that she would be able to tolerate this. Her BMI is greater than 40.

## 2011-10-05 NOTE — Assessment & Plan Note (Signed)
stableon current medication

## 2011-10-05 NOTE — Progress Notes (Signed)
 History and Physical  Patient ID: [name] MRN: [id number], SOB: 03/09/1971 41 y.o. Date of Encounter: 10/05/2011, 12:04 PM  Primary Physician: GRIFFIN,ELAINE COLLINS, MD, MD Primary Cardiologist: DB Primary Electrophysiologist:  SK  Chief Complaint: Question defibrillator2  History of Present Illness: [name] is a 41 y.o. female seen at the request of Dr. DB for consideration of ICD implantation for primary prevention in the setting of a nonischemic cardiomyopathy attributed to chemotherapy for breast cancer last administered at 2008.  Ejection fractions have been in the 25-30% range for much of the last 3 years. A year ago it had improved to 50% and has had gradual decrease since then. MRI turgor he 2013 ejection fraction was 32%.   She has a history of jumping around following a "click in her head after which she would pass out. She underwent a neuro evaluation and this has been felt to be related to conversion reaction. Alternatively, she was told that this was related to too much medication and has been better since Dr. DB decreased her beta blocker and her but her diuretic when necessary  She also is a history of palpitations which occur a couple times a month. They last minutes at a time she describes it as fast and irregular. She recalls a history of atrial fibrillation described to her but I find no information on this and the record.  She is modestly dyspnea on exertion. She has variable issues with fluid accumulation and nocturnal dyspnea. She monitors her weight and adjust her diuretics accordingly.  She does not have sleep apnea.   Past Medical History  Diagnosis Date  . 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 Breast cancer     s/p mastectomy and chemotherapy, last on september 2008  . Peripheral neuropathy     chemo- induced  . Hypertension     c/b  orthostatic hypotetion  . Depression   . Palpitation     normal sinus rythm only on 21 day heart monitor  . Cough   . Obesity   . Dizziness   . Fibromyalgia   . Diabetes mellitus   . GERD (gastroesophageal reflux disease)   . Heart murmur   . Breast calcifications     right breast  . Shortness of breath     sometimes if fluid level is up  . Interstitial cystitis   . Blood transfusion     1987 or 1988  . Headache     occasional  . Anxiety   . Anemia     1987-1988  . Nonischemic cardiomyopathy     related to chemo; EF 30% 05/2011     Past Surgical History  Procedure Date  . Abdominal hysterectomy   . Laparoscopic endometriosis fulguration   . Breast surgery     rt breast lumpectomy  . Cholecystectomy   . Tonsillectomy       Current Outpatient Prescriptions  Medication Sig Dispense Refill  . b complex vitamins tablet Take 1 tablet by mouth daily.        . beta carotene w/minerals (OCUVITE) tablet Take 1 tablet by mouth daily.      . carvedilol (COREG) 25 MG tablet Take 1 tablet (25 mg total) by mouth 2 (two) times daily with a meal.  60 tablet  6  . Cholecalciferol (VITAMIN D3) 5000 UNITS CAPS Take 2 capsules by mouth   daily.       . clonazePAM (KLONOPIN) 1 MG tablet Take 1 mg by mouth at bedtime as needed. For sleep      . Coenzyme Q10 (CO Q-10 PO) Take 100 mg by mouth daily.       . Cyanocobalamin 2500 MCG SUBL Place 2,500 mcg under the tongue daily.      . digoxin (LANOXIN) 0.125 MG tablet Take 1 tablet (125 mcg total) by mouth daily.  30 tablet  6  . DULoxetine (CYMBALTA) 60 MG capsule Take 60 mg by mouth daily.      . ELMIRON 100 MG capsule Take 100 mg by mouth Twice daily.      . lisinopril (PRINIVIL,ZESTRIL) 10 MG tablet Take 10 mg by mouth Twice daily.      . LORazepam (ATIVAN) 1 MG tablet Take 1 mg by mouth 2 (two) times daily.        . Meth-Hyo-M Bl-Na Phos-Ph Sal (URIBEL) 118 MG CAPS Take 118 mg by mouth 2 (two) times daily.      . methocarbamol (ROBAXIN)  750 MG tablet Take 750 mg by mouth 2 (two) times daily.       . Multiple Vitamins-Minerals (CENTRUM SILVER ULTRA WOMENS PO) Take 1 tablet by mouth daily.        . oxyCODONE-acetaminophen (PERCOCET) 5-325 MG per tablet Take 1 tablet by mouth 4 (four) times daily. 4 times daily      . potassium chloride SA (K-DUR,KLOR-CON) 20 MEQ tablet Take 20 mEq by mouth daily as needed. To be taken with fluid pill      . pregabalin (LYRICA) 200 MG capsule Take 600 mg by mouth at bedtime.       . RABEprazole (ACIPHEX) 20 MG tablet Take 20 mg by mouth daily.        . spironolactone (ALDACTONE) 25 MG tablet Take 12.5 mg by mouth daily.      . torsemide (DEMADEX) 20 MG tablet Take 20 mg by mouth daily as needed. For fluid retention; to be taken with Potassium      . zolpidem (AMBIEN) 10 MG tablet Take 10 mg by mouth at bedtime as needed. For insomnia         Allergies: Allergies  Allergen Reactions  . Reglan (Metoclopramide Hcl) Anxiety     History  Substance Use Topics  . Smoking status: Never Smoker   . Smokeless tobacco: Never Used  . Alcohol Use: Yes     occasional      Family History  Problem Relation Age of Onset  . Coronary artery disease    . Heart attack    . Heart disease Mother   . Heart disease Maternal Uncle   . Cancer Paternal Uncle     colon  . Heart disease Maternal Grandmother   . Cancer Paternal Grandmother     ovarian      ROS:  Please see the history of present illness.     All other systems reviewed and negative.   Vital Signs: Blood pressure 100/56, pulse 88, height 5' 7" (1.702 m), weight 258 lb 1.9 oz (117.082 kg).  PHYSICAL EXAM: General:  Well nourished, well developed obese African American female in no acute distress  HEENT: normal Lymph: no adenopathy Neck: no JVD Endocrine:  No thryomegaly Vascular: No carotid bruits; FA pulses 2+ bilaterally without bruits Cardiac:  normal S1, S2; RRR; no murmur Back: without kyphosis/scoliosis, no CVA  tenderness Lungs:  clear to auscultation bilaterally, no wheezing,   rhonchi or rales Abd: soft, nontender, no hepatomegaly Ext: no edema except on the dorsum of her foot Musculoskeletal:  No deformities, BUE and BLE strength normal and equal Skin: warm and dry Neuro:  CNs 2-12 intact, no focal abnormalities noted Psych:  Normal affect   EKG:  3/13-sinus rhythm with narrow QRS intervals 0.15/0.11/0.48 with nonspecific T-wave changes  Labs:   Lab Results  Component Value Date   WBC 8.1 08/19/2011   HGB 11.6 08/19/2011   HCT 35.3 08/19/2011   MCV 87.4 08/19/2011   PLT 250 08/19/2011   Lab Results  Component Value Date   DDIMER 0.40 11/28/2010   BNP Pro B Natriuretic peptide (BNP)  Date/Time Value Range Status  07/12/2011  1:59 AM 89.7  0-125 (pg/mL) Final  03/26/2011  9:43 AM 10.0  0.0-100.0 (pg/mL) Final  12/15/2010  1:23 PM 15.0  0.0-100.0 (pg/mL) Final  12/02/2010  8:59 PM 160.8* 0-125 (pg/mL) Final       ASSESSMENT AND PLAN:        

## 2011-10-05 NOTE — Patient Instructions (Signed)

## 2011-10-06 ENCOUNTER — Other Ambulatory Visit (INDEPENDENT_AMBULATORY_CARE_PROVIDER_SITE_OTHER): Payer: Self-pay

## 2011-10-06 ENCOUNTER — Telehealth (INDEPENDENT_AMBULATORY_CARE_PROVIDER_SITE_OTHER): Payer: Self-pay

## 2011-10-06 DIAGNOSIS — R21 Rash and other nonspecific skin eruption: Secondary | ICD-10-CM

## 2011-10-06 MED ORDER — HYDROCORTISONE 2.5 % EX LOTN: TOPICAL_LOTION | CUTANEOUS | Status: DC

## 2011-10-06 NOTE — Telephone Encounter (Signed)
Per Dr. Luisa Hart call in Hydrocortisone Cream for patient to apply to the affected areas, if no improvement patient need's to be seen in our Urgent Office.  RX for Hydrocortisone Cream sent to [facility] [address]  Patient notified.

## 2011-10-06 NOTE — Telephone Encounter (Signed)
Patient called in reporting small itchy bumps and hives covering her entire right breast radiates to her right axilla.  Patient states this started a week ago, she denies changing soaps, lotions, laundry detergent or new medications.  Patient takes Benadryl daily but states there's no improvement.  Patient s/p NL Right Breast Lumpectomy on 08/26/11.  Paged Dr. Luisa Hart.

## 2011-10-11 ENCOUNTER — Telehealth (INDEPENDENT_AMBULATORY_CARE_PROVIDER_SITE_OTHER): Payer: Self-pay

## 2011-10-11 NOTE — Telephone Encounter (Signed)
Patient called in regarding rash. Patient states itchiness is better but that the rash is continuing to spread.

## 2011-10-12 ENCOUNTER — Ambulatory Visit (INDEPENDENT_AMBULATORY_CARE_PROVIDER_SITE_OTHER): Payer: Medicare Other | Admitting: Surgery

## 2011-10-12 ENCOUNTER — Encounter (INDEPENDENT_AMBULATORY_CARE_PROVIDER_SITE_OTHER): Payer: Self-pay | Admitting: Surgery

## 2011-10-12 VITALS — BP 116/80 | HR 68 | Temp 97.3°F | Resp 14 | Ht 67.0 in | Wt 258.4 lb

## 2011-10-12 DIAGNOSIS — Z9889 Other specified postprocedural states: Secondary | ICD-10-CM

## 2011-10-12 NOTE — Patient Instructions (Signed)
The rash should fade over next 2 weeks.  If the hardness is present after 6 - 8 weeks,  Call.

## 2011-10-12 NOTE — Progress Notes (Signed)
The patient returns 6 weeks after right breast lumpectomy for right breast microcalcifications which were benign. She has developed a rash on her right breast neck and left breast. She was placed on steroids no rashes improved.  Exam: Right breast incision well-healed. Small seroma noted. No redness or signs of infection. Mild rash noted the right breast and neck which appears to be resolving left breast normal.  Impression: Skin rash secondary to contact dermatitis probably from prep  Plan: Is improving and should resolve in the next 2 weeks. Return to clinic as needed.

## 2011-10-28 ENCOUNTER — Other Ambulatory Visit: Payer: Self-pay | Admitting: *Deleted

## 2011-10-28 ENCOUNTER — Encounter (HOSPITAL_COMMUNITY): Payer: Self-pay

## 2011-10-28 ENCOUNTER — Ambulatory Visit (HOSPITAL_COMMUNITY)
Admission: RE | Admit: 2011-10-28 | Discharge: 2011-10-28 | Disposition: A | Discharge status: Home / Self Care | Payer: Medicare Other | Source: Ambulatory Visit | Attending: Internal Medicine | Admitting: Internal Medicine

## 2011-10-28 VITALS — BP 98/60 | HR 92 | Ht 67.0 in | Wt 259.8 lb

## 2011-10-28 DIAGNOSIS — I5022 Chronic systolic (congestive) heart failure: Secondary | ICD-10-CM | POA: Diagnosis not present

## 2011-10-28 DIAGNOSIS — F341 Dysthymic disorder: Secondary | ICD-10-CM | POA: Diagnosis not present

## 2011-10-28 DIAGNOSIS — Z79899 Other long term (current) drug therapy: Secondary | ICD-10-CM | POA: Diagnosis not present

## 2011-10-28 DIAGNOSIS — M542 Cervicalgia: Secondary | ICD-10-CM | POA: Diagnosis not present

## 2011-10-28 DIAGNOSIS — F411 Generalized anxiety disorder: Secondary | ICD-10-CM | POA: Diagnosis not present

## 2011-10-28 DIAGNOSIS — I429 Cardiomyopathy, unspecified: Secondary | ICD-10-CM | POA: Diagnosis not present

## 2011-10-28 DIAGNOSIS — F329 Major depressive disorder, single episode, unspecified: Secondary | ICD-10-CM | POA: Diagnosis not present

## 2011-10-28 DIAGNOSIS — G541 Lumbosacral plexus disorders: Secondary | ICD-10-CM | POA: Diagnosis not present

## 2011-10-28 LAB — BASIC METABOLIC PANEL
BUN: 10 mg/dL (ref 6–23)
CO2: 21 mEq/L (ref 19–32)
Chloride: 101 mEq/L (ref 96–112)
Glucose, Bld: 143 mg/dL — ABNORMAL HIGH (ref 70–99)
Potassium: 4.1 mEq/L (ref 3.5–5.1)
Sodium: 138 mEq/L (ref 135–145)

## 2011-10-28 LAB — CBC
HCT: 34.6 % — ABNORMAL LOW (ref 36.0–46.0)
Hemoglobin: 11.4 g/dL — ABNORMAL LOW (ref 12.0–15.0)
MCH: 28.4 pg (ref 26.0–34.0)
MCHC: 32.9 g/dL (ref 30.0–36.0)
MCV: 86.3 fL (ref 78.0–100.0)
RBC: 4.01 MIL/uL (ref 3.87–5.11)

## 2011-10-28 MED ORDER — TORSEMIDE 20 MG PO TABS: ORAL_TABLET | ORAL | Status: DC

## 2011-10-28 NOTE — Patient Instructions (Signed)
Continue current medications and as needed lasix.   Try sugar free hard candy instead of drinking as much water.   Labs today.  Follow up  with Dr. Gala Romney in 6-8 weeks  Do the following things EVERYDAY: 1) Weigh yourself in the morning before breakfast. Write it down and keep it in a log. 2) Take your medicines as prescribed 3) Eat low salt foods--Limit salt (sodium) to 2000 mg per day.  4) Stay as active as you can everyday 5) Limit all fluids for the day to less than 2 liters

## 2011-11-02 DIAGNOSIS — Z853 Personal history of malignant neoplasm of breast: Secondary | ICD-10-CM | POA: Diagnosis not present

## 2011-11-02 DIAGNOSIS — Z8711 Personal history of peptic ulcer disease: Secondary | ICD-10-CM | POA: Diagnosis not present

## 2011-11-02 DIAGNOSIS — IMO0001 Reserved for inherently not codable concepts without codable children: Secondary | ICD-10-CM | POA: Diagnosis not present

## 2011-11-02 DIAGNOSIS — K219 Gastro-esophageal reflux disease without esophagitis: Secondary | ICD-10-CM | POA: Diagnosis not present

## 2011-11-02 DIAGNOSIS — E119 Type 2 diabetes mellitus without complications: Secondary | ICD-10-CM | POA: Diagnosis not present

## 2011-11-02 DIAGNOSIS — N3010 Interstitial cystitis (chronic) without hematuria: Secondary | ICD-10-CM | POA: Diagnosis not present

## 2011-11-02 DIAGNOSIS — I10 Essential (primary) hypertension: Secondary | ICD-10-CM | POA: Diagnosis not present

## 2011-11-02 DIAGNOSIS — E669 Obesity, unspecified: Secondary | ICD-10-CM | POA: Diagnosis not present

## 2011-11-02 DIAGNOSIS — F3289 Other specified depressive episodes: Secondary | ICD-10-CM | POA: Diagnosis not present

## 2011-11-02 DIAGNOSIS — I509 Heart failure, unspecified: Secondary | ICD-10-CM | POA: Diagnosis not present

## 2011-11-02 MED ORDER — SODIUM CHLORIDE 0.9 % IR SOLN
80.0000 mg | Status: DC
  Filled 2011-11-02: qty 2

## 2011-11-02 MED ORDER — CEFAZOLIN SODIUM-DEXTROSE 2-3 GM-% IV SOLR
2.0000 g | INTRAVENOUS | Status: DC
  Filled 2011-11-02: qty 50

## 2011-11-03 ENCOUNTER — Encounter (HOSPITAL_COMMUNITY)
Admission: RE | Disposition: A | Discharge status: Home / Self Care | Payer: Self-pay | Source: Ambulatory Visit | Attending: Internal Medicine

## 2011-11-03 ENCOUNTER — Encounter: Payer: Self-pay | Admitting: *Deleted

## 2011-11-03 ENCOUNTER — Ambulatory Visit (HOSPITAL_COMMUNITY)
Admission: RE | Admit: 2011-11-03 | Discharge: 2011-11-04 | Disposition: A | Discharge status: Home / Self Care | Payer: Medicare Other | Source: Ambulatory Visit | Attending: Internal Medicine | Admitting: Internal Medicine

## 2011-11-03 ENCOUNTER — Encounter (HOSPITAL_COMMUNITY): Payer: Self-pay | Admitting: General Practice

## 2011-11-03 DIAGNOSIS — F3289 Other specified depressive episodes: Secondary | ICD-10-CM | POA: Insufficient documentation

## 2011-11-03 DIAGNOSIS — I10 Essential (primary) hypertension: Secondary | ICD-10-CM | POA: Insufficient documentation

## 2011-11-03 DIAGNOSIS — I429 Cardiomyopathy, unspecified: Secondary | ICD-10-CM | POA: Insufficient documentation

## 2011-11-03 DIAGNOSIS — F329 Major depressive disorder, single episode, unspecified: Secondary | ICD-10-CM | POA: Insufficient documentation

## 2011-11-03 DIAGNOSIS — I428 Other cardiomyopathies: Secondary | ICD-10-CM

## 2011-11-03 DIAGNOSIS — IMO0001 Reserved for inherently not codable concepts without codable children: Secondary | ICD-10-CM | POA: Insufficient documentation

## 2011-11-03 DIAGNOSIS — Z8711 Personal history of peptic ulcer disease: Secondary | ICD-10-CM | POA: Insufficient documentation

## 2011-11-03 DIAGNOSIS — N3010 Interstitial cystitis (chronic) without hematuria: Secondary | ICD-10-CM | POA: Insufficient documentation

## 2011-11-03 DIAGNOSIS — E119 Type 2 diabetes mellitus without complications: Secondary | ICD-10-CM | POA: Insufficient documentation

## 2011-11-03 DIAGNOSIS — E669 Obesity, unspecified: Secondary | ICD-10-CM | POA: Insufficient documentation

## 2011-11-03 DIAGNOSIS — I5022 Chronic systolic (congestive) heart failure: Secondary | ICD-10-CM | POA: Insufficient documentation

## 2011-11-03 DIAGNOSIS — E663 Overweight: Secondary | ICD-10-CM

## 2011-11-03 DIAGNOSIS — Z95810 Presence of automatic (implantable) cardiac defibrillator: Secondary | ICD-10-CM | POA: Insufficient documentation

## 2011-11-03 DIAGNOSIS — K219 Gastro-esophageal reflux disease without esophagitis: Secondary | ICD-10-CM | POA: Insufficient documentation

## 2011-11-03 DIAGNOSIS — I509 Heart failure, unspecified: Secondary | ICD-10-CM | POA: Insufficient documentation

## 2011-11-03 DIAGNOSIS — R002 Palpitations: Secondary | ICD-10-CM

## 2011-11-03 DIAGNOSIS — Z853 Personal history of malignant neoplasm of breast: Secondary | ICD-10-CM | POA: Insufficient documentation

## 2011-11-03 HISTORY — DX: Type 2 diabetes mellitus without complications: E11.9

## 2011-11-03 HISTORY — PX: CARDIAC DEFIBRILLATOR PLACEMENT: SHX171

## 2011-11-03 HISTORY — DX: Pneumonia, unspecified organism: J18.9

## 2011-11-03 HISTORY — PX: IMPLANTABLE CARDIOVERTER DEFIBRILLATOR IMPLANT: SHX5473

## 2011-11-03 HISTORY — DX: Personal history of other diseases of the digestive system: Z87.19

## 2011-11-03 HISTORY — DX: Presence of automatic (implantable) cardiac defibrillator: Z95.810

## 2011-11-03 HISTORY — DX: Personal history of peptic ulcer disease: Z87.11

## 2011-11-03 LAB — GLUCOSE, CAPILLARY
Glucose-Capillary: 115 mg/dL — ABNORMAL HIGH (ref 70–99)
Glucose-Capillary: 157 mg/dL — ABNORMAL HIGH (ref 70–99)
Glucose-Capillary: 222 mg/dL — ABNORMAL HIGH (ref 70–99)

## 2011-11-03 LAB — SURGICAL PCR SCREEN: Staphylococcus aureus: NEGATIVE

## 2011-11-03 SURGERY — IMPLANTABLE CARDIOVERTER DEFIBRILLATOR IMPLANT
Anesthesia: LOCAL

## 2011-11-03 MED ORDER — LIDOCAINE HCL (PF) 1 % IJ SOLN
INTRAMUSCULAR | Status: AC
  Filled 2011-11-03: qty 60

## 2011-11-03 MED ORDER — OXYCODONE HCL 5 MG PO TABS
5.0000 mg | ORAL_TABLET | Freq: Once | ORAL | Status: AC
  Administered 2011-11-03: 5 mg via ORAL
  Filled 2011-11-03: qty 1

## 2011-11-03 MED ORDER — MIDAZOLAM HCL 5 MG/5ML IJ SOLN
INTRAMUSCULAR | Status: AC
  Filled 2011-11-03: qty 5

## 2011-11-03 MED ORDER — PENTOSAN POLYSULFATE SODIUM 100 MG PO CAPS
100.0000 mg | ORAL_CAPSULE | Freq: Two times a day (BID) | ORAL | Status: DC
  Administered 2011-11-03 – 2011-11-04 (×2): 100 mg via ORAL
  Filled 2011-11-03 (×3): qty 1

## 2011-11-03 MED ORDER — DULOXETINE HCL 60 MG PO CPEP
60.0000 mg | ORAL_CAPSULE | Freq: Every day | ORAL | Status: DC
  Administered 2011-11-04: 60 mg via ORAL
  Filled 2011-11-03: qty 1

## 2011-11-03 MED ORDER — FENTANYL CITRATE 0.05 MG/ML IJ SOLN
INTRAMUSCULAR | Status: AC
  Filled 2011-11-03: qty 2

## 2011-11-03 MED ORDER — SPIRONOLACTONE 12.5 MG HALF TABLET
12.5000 mg | ORAL_TABLET | Freq: Every day | ORAL | Status: DC
  Administered 2011-11-04: 12.5 mg via ORAL
  Filled 2011-11-03: qty 1

## 2011-11-03 MED ORDER — CYANOCOBALAMIN 2500 MCG SL SUBL: 2500.0000 ug | SUBLINGUAL_TABLET | Freq: Every day | SUBLINGUAL | Status: DC

## 2011-11-03 MED ORDER — OCUVITE PO TABS
1.0000 | ORAL_TABLET | Freq: Every day | ORAL | Status: DC
  Administered 2011-11-04: 1 via ORAL
  Filled 2011-11-03: qty 1

## 2011-11-03 MED ORDER — MUPIROCIN 2 % EX OINT
TOPICAL_OINTMENT | Freq: Once | CUTANEOUS | Status: AC
  Administered 2011-11-03: 10:00:00 via NASAL
  Filled 2011-11-03 (×2): qty 22

## 2011-11-03 MED ORDER — B COMPLEX-C PO TABS
1.0000 | ORAL_TABLET | Freq: Every day | ORAL | Status: DC
  Administered 2011-11-04: 1 via ORAL
  Filled 2011-11-03: qty 1

## 2011-11-03 MED ORDER — METHOCARBAMOL 750 MG PO TABS
750.0000 mg | ORAL_TABLET | Freq: Two times a day (BID) | ORAL | Status: DC
  Administered 2011-11-03 – 2011-11-04 (×2): 750 mg via ORAL
  Filled 2011-11-03 (×3): qty 1

## 2011-11-03 MED ORDER — HEPARIN (PORCINE) IN NACL 2-0.9 UNIT/ML-% IJ SOLN
INTRAMUSCULAR | Status: AC
  Filled 2011-11-03: qty 1000

## 2011-11-03 MED ORDER — SODIUM CHLORIDE 0.9 % IV SOLN
INTRAVENOUS | Status: DC
  Administered 2011-11-03: 10:00:00 via INTRAVENOUS

## 2011-11-03 MED ORDER — CEFAZOLIN SODIUM-DEXTROSE 2-3 GM-% IV SOLR
INTRAVENOUS | Status: AC
  Filled 2011-11-03: qty 50

## 2011-11-03 MED ORDER — SODIUM CHLORIDE 0.9 % IV SOLN
INTRAVENOUS | Status: AC
  Administered 2011-11-03: 14:00:00 via INTRAVENOUS

## 2011-11-03 MED ORDER — LIDOCAINE HCL (PF) 1 % IJ SOLN
INTRAMUSCULAR | Status: AC
  Filled 2011-11-03: qty 30

## 2011-11-03 MED ORDER — FUROSEMIDE 10 MG/ML IJ SOLN
80.0000 mg | Freq: Two times a day (BID) | INTRAMUSCULAR | Status: DC
  Administered 2011-11-03 – 2011-11-04 (×2): 80 mg via INTRAVENOUS
  Filled 2011-11-03 (×4): qty 8

## 2011-11-03 MED ORDER — LORAZEPAM 0.5 MG PO TABS: 1.0000 mg | ORAL_TABLET | Freq: Two times a day (BID) | ORAL | Status: DC | PRN

## 2011-11-03 MED ORDER — SODIUM CHLORIDE 0.45 % IV SOLN: INTRAVENOUS | Status: DC

## 2011-11-03 MED ORDER — DIGOXIN 125 MCG PO TABS
125.0000 ug | ORAL_TABLET | Freq: Every day | ORAL | Status: DC
Start: 2011-11-04 — End: 2011-11-04
  Administered 2011-11-04: 125 ug via ORAL
  Filled 2011-11-03: qty 1

## 2011-11-03 MED ORDER — CARVEDILOL 25 MG PO TABS
25.0000 mg | ORAL_TABLET | Freq: Two times a day (BID) | ORAL | Status: DC
  Administered 2011-11-03 – 2011-11-04 (×2): 25 mg via ORAL
  Filled 2011-11-03 (×4): qty 1

## 2011-11-03 MED ORDER — CEFAZOLIN SODIUM 1-5 GM-% IV SOLN
1.0000 g | Freq: Four times a day (QID) | INTRAVENOUS | Status: DC
  Filled 2011-11-03 (×3): qty 50

## 2011-11-03 MED ORDER — VITAMIN B-12 1000 MCG PO TABS
2500.0000 ug | ORAL_TABLET | Freq: Every day | ORAL | Status: DC
  Administered 2011-11-04: 2500 ug via ORAL
  Filled 2011-11-03: qty 1

## 2011-11-03 MED ORDER — ONDANSETRON HCL 4 MG/2ML IJ SOLN: 4.0000 mg | Freq: Four times a day (QID) | INTRAMUSCULAR | Status: DC | PRN

## 2011-11-03 MED ORDER — CEFAZOLIN SODIUM 1-5 GM-% IV SOLN
1.0000 g | Freq: Four times a day (QID) | INTRAVENOUS | Status: AC
  Administered 2011-11-03 – 2011-11-04 (×3): 1 g via INTRAVENOUS
  Filled 2011-11-03 (×4): qty 50

## 2011-11-03 MED ORDER — LISINOPRIL 10 MG PO TABS
10.0000 mg | ORAL_TABLET | Freq: Every day | ORAL | Status: DC
  Administered 2011-11-04: 10 mg via ORAL
  Filled 2011-11-03: qty 1

## 2011-11-03 MED ORDER — ATROPINE SULFATE 1 MG/ML IJ SOLN
INTRAMUSCULAR | Status: AC
  Filled 2011-11-03: qty 1

## 2011-11-03 MED ORDER — CLONAZEPAM 0.5 MG PO TABS: 1.0000 mg | ORAL_TABLET | Freq: Every evening | ORAL | Status: DC | PRN

## 2011-11-03 MED ORDER — PREGABALIN 25 MG PO CAPS
200.0000 mg | ORAL_CAPSULE | Freq: Three times a day (TID) | ORAL | Status: DC
  Administered 2011-11-03 – 2011-11-04 (×3): 200 mg via ORAL
  Filled 2011-11-03 (×3): qty 8

## 2011-11-03 MED ORDER — ACETAMINOPHEN 325 MG PO TABS
325.0000 mg | ORAL_TABLET | ORAL | Status: DC | PRN
  Administered 2011-11-03 – 2011-11-04 (×3): 650 mg via ORAL
  Filled 2011-11-03 (×3): qty 2

## 2011-11-03 MED ORDER — CHLORHEXIDINE GLUCONATE 4 % EX LIQD
60.0000 mL | Freq: Once | CUTANEOUS | Status: AC
  Administered 2011-11-03: 4 via TOPICAL
  Filled 2011-11-03: qty 60

## 2011-11-03 MED ORDER — PREGABALIN 25 MG PO CAPS: 200.0000 mg | ORAL_CAPSULE | Freq: Three times a day (TID) | ORAL | Status: DC

## 2011-11-03 NOTE — CV Procedure (Signed)
ICD implant for NICM CVP>20cm Cx none apparent

## 2011-11-03 NOTE — H&P (View-Only) (Signed)
History and Physical  Patient ID: [name] MRN: [id number], SOB: 1970-10-06 41 y.o. Date of Encounter: 10/05/2011, 12:04 PM  Primary Physician: Astrid Divine, MD, MD Primary Cardiologist: DB Primary Electrophysiologist:  SK  Chief Complaint: Question defibrillator2  History of Present Illness: [name] is a 41 y.o. female seen at the request of Dr. Dorthea Cove for consideration of ICD implantation for primary prevention in the setting of a nonischemic cardiomyopathy attributed to chemotherapy for breast cancer last administered at 2008.  Ejection fractions have been in the 25-30% range for much of the last 3 years. A year ago it had improved to 50% and has had gradual decrease since then. MRI turgor he 2013 ejection fraction was 32%.   She has a history of jumping around following a "click in her head after which she would pass out. She underwent a neuro evaluation and this has been felt to be related to conversion reaction. Alternatively, she was told that this was related to too much medication and has been better since Dr. Dorthea Cove decreased her beta blocker and her but her diuretic when necessary  She also is a history of palpitations which occur a couple times a month. They last minutes at a time she describes it as fast and irregular. She recalls a history of atrial fibrillation described to her but I find no information on this and the record.  She is modestly dyspnea on exertion. She has variable issues with fluid accumulation and nocturnal dyspnea. She monitors her weight and adjust her diuretics accordingly.  She does not have sleep apnea.   Past Medical History  Diagnosis Date  . 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 Breast cancer     s/p mastectomy and chemotherapy, last on september 2008  . Peripheral neuropathy     chemo- induced  . Hypertension     c/b  orthostatic hypotetion  . Depression   . Palpitation     normal sinus rythm only on 21 day heart monitor  . Cough   . Obesity   . Dizziness   . Fibromyalgia   . Diabetes mellitus   . GERD (gastroesophageal reflux disease)   . Heart murmur   . Breast calcifications     right breast  . Shortness of breath     sometimes if fluid level is up  . Interstitial cystitis   . Blood transfusion     1987 or 1988  . Headache     occasional  . Anxiety   . Anemia     1987-1988  . Nonischemic cardiomyopathy     related to chemo; EF 30% 05/2011     Past Surgical History  Procedure Date  . Abdominal hysterectomy   . Laparoscopic endometriosis fulguration   . Breast surgery     rt breast lumpectomy  . Cholecystectomy   . Tonsillectomy       Current Outpatient Prescriptions  Medication Sig Dispense Refill  . b complex vitamins tablet Take 1 tablet by mouth daily.        . beta carotene w/minerals (OCUVITE) tablet Take 1 tablet by mouth daily.      . carvedilol (COREG) 25 MG tablet Take 1 tablet (25 mg total) by mouth 2 (two) times daily with a meal.  60 tablet  6  . Cholecalciferol (VITAMIN D3) 5000 UNITS CAPS Take 2 capsules by mouth  daily.       . clonazePAM (KLONOPIN) 1 MG tablet Take 1 mg by mouth at bedtime as needed. For sleep      . Coenzyme Q10 (CO Q-10 PO) Take 100 mg by mouth daily.       . Cyanocobalamin 2500 MCG SUBL Place 2,500 mcg under the tongue daily.      . digoxin (LANOXIN) 0.125 MG tablet Take 1 tablet (125 mcg total) by mouth daily.  30 tablet  6  . DULoxetine (CYMBALTA) 60 MG capsule Take 60 mg by mouth daily.      Marland Kitchen ELMIRON 100 MG capsule Take 100 mg by mouth Twice daily.      Marland Kitchen lisinopril (PRINIVIL,ZESTRIL) 10 MG tablet Take 10 mg by mouth Twice daily.      Marland Kitchen LORazepam (ATIVAN) 1 MG tablet Take 1 mg by mouth 2 (two) times daily.        . Meth-Hyo-M Bl-Na Phos-Ph Sal (URIBEL) 118 MG CAPS Take 118 mg by mouth 2 (two) times daily.      . methocarbamol (ROBAXIN)  750 MG tablet Take 750 mg by mouth 2 (two) times daily.       . Multiple Vitamins-Minerals (CENTRUM SILVER ULTRA WOMENS PO) Take 1 tablet by mouth daily.        Marland Kitchen oxyCODONE-acetaminophen (PERCOCET) 5-325 MG per tablet Take 1 tablet by mouth 4 (four) times daily. 4 times daily      . potassium chloride SA (K-DUR,KLOR-CON) 20 MEQ tablet Take 20 mEq by mouth daily as needed. To be taken with fluid pill      . pregabalin (LYRICA) 200 MG capsule Take 600 mg by mouth at bedtime.       . RABEprazole (ACIPHEX) 20 MG tablet Take 20 mg by mouth daily.        Marland Kitchen spironolactone (ALDACTONE) 25 MG tablet Take 12.5 mg by mouth daily.      Marland Kitchen torsemide (DEMADEX) 20 MG tablet Take 20 mg by mouth daily as needed. For fluid retention; to be taken with Potassium      . zolpidem (AMBIEN) 10 MG tablet Take 10 mg by mouth at bedtime as needed. For insomnia         Allergies: Allergies  Allergen Reactions  . Reglan (Metoclopramide Hcl) Anxiety     History  Substance Use Topics  . Smoking status: Never Smoker   . Smokeless tobacco: Never Used  . Alcohol Use: Yes     occasional      Family History  Problem Relation Age of Onset  . Coronary artery disease    . Heart attack    . Heart disease Mother   . Heart disease Maternal Uncle   . Cancer Paternal Uncle     colon  . Heart disease Maternal Grandmother   . Cancer Paternal Grandmother     ovarian      ROS:  Please see the history of present illness.     All other systems reviewed and negative.   Vital Signs: Blood pressure 100/56, pulse 88, height 5\' 7"  (1.702 m), weight 258 lb 1.9 oz (117.082 kg).  PHYSICAL EXAM: General:  Well nourished, well developed obese African American female in no acute distress  HEENT: normal Lymph: no adenopathy Neck: no JVD Endocrine:  No thryomegaly Vascular: No carotid bruits; FA pulses 2+ bilaterally without bruits Cardiac:  normal S1, S2; RRR; no murmur Back: without kyphosis/scoliosis, no CVA  tenderness Lungs:  clear to auscultation bilaterally, no wheezing,  rhonchi or rales Abd: soft, nontender, no hepatomegaly Ext: no edema except on the dorsum of her foot Musculoskeletal:  No deformities, BUE and BLE strength normal and equal Skin: warm and dry Neuro:  CNs 2-12 intact, no focal abnormalities noted Psych:  Normal affect   EKG:  3/13-sinus rhythm with narrow QRS intervals 0.15/0.11/0.48 with nonspecific T-wave changes  Labs:   Lab Results  Component Value Date   WBC 8.1 08/19/2011   HGB 11.6 08/19/2011   HCT 35.3 08/19/2011   MCV 87.4 08/19/2011   PLT 250 08/19/2011   Lab Results  Component Value Date   DDIMER 0.40 11/28/2010   BNP Pro B Natriuretic peptide (BNP)  Date/Time Value Range Status  07/12/2011  1:59 AM 89.7  0-125 (pg/mL) Final  03/26/2011  9:43 AM 10.0  0.0-100.0 (pg/mL) Final  12/15/2010  1:23 PM 15.0  0.0-100.0 (pg/mL) Final  12/02/2010  8:59 PM 160.8* 0-125 (pg/mL) Final       ASSESSMENT AND PLAN:

## 2011-11-03 NOTE — Op Note (Signed)
NAMEBRINKLEY, [name] NO.:  [id number]  MEDICAL RECORD NO.:  [id number]  LOCATION:  2506                         FACILITY:  [facility]  PHYSICIAN:  Duke Salvia, MD, FACCDATE OF BIRTH:  27-Apr-1971  DATE OF PROCEDURE:  11/03/2011 DATE OF DISCHARGE:                              OPERATIVE REPORT   PREOPERATIVE DIAGNOSES:  Nonischemic cardiomyopathy, class 3 congestive heart failure, narrow QRS.  POSTOPERATIVE DIAGNOSES:  Nonischemic cardiomyopathy, class 3 congestive heart failure, narrow QRS.  PROCEDURE:  Single-chamber defibrillator implantation with lead testing, but the defibrillation threshold testing was deferred.  Following obtained informed consent, the patient was brought to the electrophysiology laboratory and placed on the fluoroscopic table in the supine position.  After routine prep and drape of the left upper chest, lidocaine was infiltrated in prepectoral subclavicular region.  Incision was made and carried down to layer of the prepectoral fascia.  Using electrocautery and sharp dissection, a pocket was formed similarly, hemostasis was obtained.  Thereafter, attention was turned to gain access to the extrathoracic left subclavian vein, which was accomplished without difficulty and without the aspiration or puncture of the artery.  A single venipuncture was accomplished.  A guidewire was placed and retained.  Subsequently, an 8-French sheath was placed, which was passed a [employer], model [id number] single coil defibrillator lead, serial number [id number].  I ended up removing this from the body and I thought that the Gore-Tex had been removed, so we actually removed the sheath and put a new sheath, and I did not find Gore-Tex in the sheath.  We opened up another lead. We put it in saline and realized that there was evolution of a transparent-appearance to the Gore-Tex covering, which I was not aware of.  We then ended up using the initial lead and  passed it into the right ventricular apex with bipolar R-wave was 9.9 with a pace impedance of 730, threshold 0.3 V at 0.5 msec.  Current threshold about 2 mA. There was no diaphragmatic pacing at 10 V and the current of injury was brisk.  The lead was attached to a [employer] [id number] device, serial number [id number]. Through this device, bipolar R-wave was 14 with a pace impedance of 513, and a threshold of 0.75 at 0.4.  High-voltage impedance was 68 ohms. The device was implanted.  The pocket was copiously irrigated with antibiotic-containing saline solution.  Hemostasis was assured.  Leads and pulse generator were placed in the pocket and secured to the prepectoral fascia.  The wound was then closed in 2 layers in normal fashion.  The wound was washed and dried.  Benzoin and Steri-Strips dressing was applied.  Needle counts, sponge counts, and instrument counts were correct at the end of the procedure according to staff.  The patient's DFT testing was deferred because of the use of home benzodiazepines and other sedatives.  She will need to be done with anesthesia support.     Duke Salvia, MD, [facility]     SCK/MEDQ  D:  11/03/2011  T:  11/03/2011  Job:  [id number]

## 2011-11-03 NOTE — Progress Notes (Signed)
Orthopedic Tech Progress Note Patient Details:  [name] [birth year] [id number]  Other Ortho Devices Type of Ortho Device: Other (comment) (arm sling) Ortho Device Location: (L) UE Ortho Device Interventions: Application   Jennye Moccasin 11/03/2011, 3:25 PM

## 2011-11-03 NOTE — Interval H&P Note (Signed)
History and Physical Interval Note:  11/03/2011 10:16 AM  [name]  has presented today for surgery, with the diagnosis of heart failure  The various methods of treatment have been discussed with the patient and family. After consideration of risks, benefits and other options for treatment, the patient has consented to  Procedure(s) (LRB): IMPLANTABLE CARDIOVERTER DEFIBRILLATOR IMPLANT (N/A) as a surgical intervention .  The patients' history has been reviewed, patient examined, no change in status, stable for surgery.  I have reviewed the patients' chart and labs.  Questions were answered to the patient's satisfaction.     Sherryl Manges

## 2011-11-04 ENCOUNTER — Ambulatory Visit (HOSPITAL_COMMUNITY): Payer: Medicare Other

## 2011-11-04 ENCOUNTER — Encounter (HOSPITAL_COMMUNITY): Payer: Self-pay | Admitting: Physician Assistant

## 2011-11-04 DIAGNOSIS — I429 Cardiomyopathy, unspecified: Secondary | ICD-10-CM

## 2011-11-04 DIAGNOSIS — Z95810 Presence of automatic (implantable) cardiac defibrillator: Secondary | ICD-10-CM | POA: Diagnosis not present

## 2011-11-04 LAB — BASIC METABOLIC PANEL
CO2: 28 mEq/L (ref 19–32)
Calcium: 9.3 mg/dL (ref 8.4–10.5)
GFR calc non Af Amer: >90 mL/min (ref >90)
Sodium: 135 mEq/L (ref 135–145)

## 2011-11-04 LAB — GLUCOSE, CAPILLARY
Glucose-Capillary: 190 mg/dL — ABNORMAL HIGH (ref 70–99)
Glucose-Capillary: 124 mg/dL — ABNORMAL HIGH (ref 70–99)

## 2011-11-04 MED ORDER — TORSEMIDE 20 MG PO TABS: 20.0000 mg | ORAL_TABLET | Freq: Every day | ORAL | Status: DC

## 2011-11-04 MED ORDER — POTASSIUM CHLORIDE CRYS ER 20 MEQ PO TBCR: 20.0000 meq | EXTENDED_RELEASE_TABLET | Freq: Every day | ORAL | Status: DC

## 2011-11-04 MED ORDER — HYDROCORTISONE 1 % EX CREA
TOPICAL_CREAM | Freq: Four times a day (QID) | CUTANEOUS | Status: DC | PRN
  Filled 2011-11-04: qty 28

## 2011-11-04 MED ORDER — DIPHENHYDRAMINE HCL 25 MG PO CAPS
25.0000 mg | ORAL_CAPSULE | Freq: Once | ORAL | Status: AC
  Administered 2011-11-04: 25 mg via ORAL
  Filled 2011-11-04: qty 1

## 2011-11-04 NOTE — Discharge Summary (Signed)
Discharge Summary   Patient ID: [name] MRN: [id number], DOB/AGE: [birth year] 41 y.o. Admit date: 11/03/2011 D/C date:     11/04/2011   Primary Discharge Diagnoses:  1. Nonischemic cardiomyopathy s/p Medtronic ICD implantation for primary prevention  Secondary Discharge Diagnoses:  1. Breast cancer s/p chemo 2. Peripheral neuropathy, chemo induced 3. Hypertension complicated by orthostatic hypotension 4. Depression 5. Palpitations (NSR on 21 day monitor) 6. Obesity 7. Fibromyalgia 8. GERD 9. Heart murmur 10. Breast calcifications R breast 11. Interstitial cystitis  12. Anxiety 13. Blood transfusion 1980's 14. Anemia 1980's 15. History of stomach ulcers  Hospital Course: [name] is a 41 y.o. female with a history of morbid obesity, depression, fibromyalgia, congestive heart failure secondary to nonischemic cardiomyopathy related to her chemotherapy for breast cancer. Last chem 2008. She is still in remission. She was seen at the request of Dr. Gala Romney for ICD implantation for primary prevention reasons. Last assessment of EF was 32% by cardiac MRI 05/2011. She was brought in for this procedure 11/03/11 and ultimately had single-chamber Medtronic defibrillator implantation. The patient's DFT testing was deferred because of the use of home benzodiazepines and other sedatives. Dr. Graciela Husbands ultimately deferred this in lieu of device programming. The patient tolerated the procedure well. It was felt that she had some component of her chronic systolic CHF so was treated with IV lasix overnight into today with good diuresis. Dr. Graciela Husbands feels she needs to go back on her scheduled Demadex at 20mg  daily. We will resume her potassium along with that. The patient was seen and examined today and felt stable for discharge by Dr. Graciela Husbands.   Discharge Vitals: Blood pressure 119/70, pulse 85, temperature 97.9 F (36.6 C), temperature source Oral, resp. rate 16, height 5\' 7"  (1.702 m), weight 252 lb 13.9  oz (114.7 kg), SpO2 99.00%.  Labs: Lab Results  Component Value Date   WBC 9.6 10/28/2011   HGB 11.4* 10/28/2011   HCT 34.6* 10/28/2011   MCV 86.3 10/28/2011   PLT 259 10/28/2011     Lab 11/04/11 1055  NA 135  K 3.8  CL 94*  CO2 28  BUN 11  CREATININE 0.62  CALCIUM 9.3  PROT --  BILITOT --  ALKPHOS --  ALT --  AST --  GLUCOSE 183*   Diagnostic Studies/Procedures   1. Chest 2 View 11/04/2011  *RADIOLOGY REPORT*  Clinical Data: Post ICD insertion  CHEST - 2 VIEW  Comparison: 07/12/2011  Findings: Left subclavian single lead ICD in satisfactory position.  Lungs are essentially clear. No pleural effusion or pneumothorax.  The heart is normal in size.  Mild degenerative changes of the visualized thoracolumbar spine.  Surgical clips in the right axilla.  Cholecystectomy clips.  IMPRESSION: Left subclavian single lead ICD in satisfactory position.  No pneumothorax.  Original Report Authenticated By: Charline Bills, M.D.    Discharge Medications   Medication List  As of 11/04/2011  3:31 PM   TAKE these medications         B-complex with vitamin C tablet   Take 1 tablet by mouth daily.      beta carotene w/minerals tablet   Take 1 tablet by mouth daily.      carvedilol 25 MG tablet   Commonly known as: COREG   Take 1 tablet (25 mg total) by mouth 2 (two) times daily with a meal.      clonazePAM 1 MG tablet   Commonly known as: KLONOPIN   Take 1 mg by  mouth at bedtime as needed. For sleep      Co Q-10 100 MG Caps   Take 100 mg by mouth daily.      Cyanocobalamin 2500 MCG Subl   Place 2,500 mcg under the tongue daily.      digoxin 0.125 MG tablet   Commonly known as: LANOXIN   Take 1 tablet (125 mcg total) by mouth daily.      DULoxetine 60 MG capsule   Commonly known as: CYMBALTA   Take 60 mg by mouth daily.      GLUCOSAMINE-CHONDROITIN PO   Take 1,200 mg by mouth daily.      hydrocortisone 2.5 % lotion   as needed. Apply to affected area 2 times daily       lisinopril 10 MG tablet   Commonly known as: PRINIVIL,ZESTRIL   Take 10 mg by mouth Twice daily.      LORazepam 1 MG tablet   Commonly known as: ATIVAN   Take 1 mg by mouth 2 (two) times daily as needed. For anxiety      methocarbamol 750 MG tablet   Commonly known as: ROBAXIN   Take 750 mg by mouth 2 (two) times daily.      pentosan polysulfate 100 MG capsule   Commonly known as: ELMIRON   Take 100 mg by mouth 2 (two) times daily.      potassium chloride SA 20 MEQ tablet   Commonly known as: K-DUR,KLOR-CON   Take 1 tablet (20 mEq total) by mouth daily.      pregabalin 200 MG capsule   Commonly known as: LYRICA   Take 200 mg by mouth 3 (three) times daily.      RABEprazole 20 MG tablet   Commonly known as: ACIPHEX   Take 20 mg by mouth daily.      spironolactone 25 MG tablet   Commonly known as: ALDACTONE   Take 12.5 mg by mouth daily.      torsemide 20 MG tablet   Commonly known as: DEMADEX   Take 1 tablet (20 mg total) by mouth daily.      Vitamin D3 5000 UNITS Caps   Take 2 capsules by mouth daily.            Disposition   The patient will be discharged in stable condition to home. Discharge Orders    Future Appointments: Provider: Department: Dept Phone: Center:   11/15/2011 2:30 PM [facility] [place] [phone] [facility]   02/09/2012 9:30 AM Duke Salvia, MD [facility] [phone] [facility]     Future Orders Please Complete By Expires   Diet - low sodium heart healthy      Increase activity slowly      Comments:   Please see attached sheet for instructions on wound care, activity, and bathing.       Follow-up Information    Follow up with [facility]. (Wound Check - 11/15/11 at 2:30pm)    Contact information:   [address] [place] [address] [phone]      Follow up with Sherryl Manges, MD. (02/09/12 at 9:30am)    Contact information:   [address], [address] [place] [address] [phone]            Duration of Discharge Encounter: Greater than 30 minutes including physician and PA time.  Signed, Dayna Dunn PA-C 11/04/2011, 3:31 PM  Also treated for CHF with IV diuretics with  sighnificant improvement

## 2011-11-04 NOTE — Progress Notes (Signed)
  Patient Name: [name]      SUBJECTIVE: some pain last pm and breathing beter today  Past Medical History  Diagnosis Date  . 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 Peripheral neuropathy     chemo- induced  . Hypertension     c/b orthostatic hypotetion  . Depression   . Palpitation     normal sinus rythm only on 21 day heart monitor  . Cough   . Obesity   . Dizziness   . Fibromyalgia   . GERD (gastroesophageal reflux disease)   . Heart murmur   . Breast calcifications     right breast  . Interstitial cystitis   . Anxiety   . Nonischemic cardiomyopathy     related to chemo; EF 30% 05/2011  . ICD (implantable cardiac defibrillator) in place   . Pneumonia 2000's    "once"  . Shortness of breath     sometimes if fluid level is up  . Exertional dyspnea   . Type II diabetes mellitus   . Blood transfusion 1980's    1987 or 1988  . Anemia 1980's    [id number]  . History of stomach ulcers   . Headache     "often"  . Breast cancer     right;    PHYSICAL EXAM Filed Vitals:   11/03/11 2116 11/04/11 0049 11/04/11 0400 11/04/11 0801  BP: 102/50 104/55 106/52 108/53  Pulse: 92 96 98 105  Temp: 97.9 F (36.6 C) 98.3 F (36.8 C) 97.9 F (36.6 C) 98.7 F (37.1 C)  TempSrc: Oral Oral Oral Oral  Resp: 20 16 18 18   Height:      Weight:   252 lb 13.9 oz (114.7 kg)   SpO2: 97% 97% 97% 99%   Well developed and nourished in no acute distress HENT normal Neck supple  Pocket without hematoma Clear Regular rate and rhythm, no murmurs or gallops Abd-soft with active BS No Clubbing cyanosis edema Skin-warm and dry A & Oriented  Grossly normal sensory and motor function   TELEMETRY: Reviewed telemetry pt in sinus :    Intake/Output Summary (Last 24 hours) at 11/04/11 1610 Last data filed at 11/04/11 0000  Gross per 24 hour  Intake    530 ml  Output    3800 ml  Net  -3270 ml    LABS: Basic Metabolic Panel:  Lab 10/28/11 9604  NA 138  K 4.1  CL 101  CO2 21  GLUCOSE 143*  BUN 10  CREATININE 0.67  CALCIUM 9.2  MG --  PHOS --   Cardiac Enzymes: No results found for this basename: CKTOTAL:3,CKMB:3,CKMBINDEX:3,TROPONINI:3 in the last 72 hours CBC:  Lab 10/28/11 1323  WBC 9.6  NEUTROABS --  HGB 11.4*  HCT 34.6*  MCV 86.3  PLT 259     Device Interrogation: stabble function   ASSESSMENT AND PLAN:  Patient Active Hospital Problem List: CARDIOMYOPATHY, SECONDARY (04/01/2009)   SYSTOLIC HEART FAILURE, CHRONIC (08/25/2008)   Good diuresis overnight will check BMET and discharge after  AM IV lasix F/u wound clinnic in 10-14 days, CHF as scheduled and SK 3 m   Signed, Sherryl Manges MD  11/04/2011

## 2011-11-04 NOTE — Discharge Instructions (Signed)
Supplemental Discharge Instructions for  Pacemaker/Defibrillator Patients  Activity No heavy lifting or vigorous activity with your left/right arm for 6 to 8 weeks.  Do not raise your left/right arm above your head for one week.  Gradually raise your affected arm as drawn below.            11/08/11                             11/09/11                     11/10/11                 11/11/11  NO DRIVING for 1 week; you may begin driving on  1/61/09 WOUND CARE   Keep the wound area clean and dry.  Do not get this area wet for one week. No showers for one week; you may shower on 11/11/11.   The tape/steri-strips on your wound will fall off; do not pull them off.  No bandage is needed on the site.  DO  NOT apply any creams, oils, or ointments to the wound area.   If you notice any drainage or discharge from the wound, any swelling or bruising at the site, or you develop a fever > 101? F after you are discharged home, call the office at once.  Special Instructions   You are still able to use cellular telephones; use the ear opposite the side where you have your pacemaker/defibrillator.  Avoid carrying your cellular phone near your device.   When traveling through airports, show security personnel your identification card to avoid being screened in the metal detectors.  Ask the security personnel to use the hand wand.   Avoid arc welding equipment, MRI testing (magnetic resonance imaging), TENS units (transcutaneous nerve stimulators).  Call the office for questions about other devices.   Avoid electrical appliances that are in poor condition or are not properly grounded.   Microwave ovens are safe to be near or to operate.  Additional information for defibrillator patients should your device go off:   If your device goes off ONCE and you feel fine afterward, notify the device clinic nurses.   If your device goes off ONCE and you do not feel well afterward, call 911.   If your device goes off TWICE,  call 911.   If your device goes off THREE times in one day, call 911.  DO NOT DRIVE YOURSELF OR A FAMILY MEMBER WITH A DEFIBRILLATOR TO THE HOSPITAL--CALL 911.      Cardioverter Defibrillator Implantation The implantable cardioverter defibrillator (ICD) is a device that monitors the heart rhythm. If it detects a dangerous heart rhythm, it either electrically makes the heart beat faster (pacing), or delivers a small electrical shock. The patient may not feel the pacing, but the shock may be felt as a strong jolt in the chest. The ICD is normally used to quickly fix heart rhythms that cannot wait for an ambulance. Rhythms in which the heart has stopped pumping must be changed as quickly as possible. An ICD may be used for other problems such as:  Less dangerous abnormal heart rhythms.   Assisting weakened heart muscle.   Damage and high risk after a heart attack.  A newer ICD called bi-ventricular (Bi-VICD) uses an extra wire to help both sides of the heart work together. This means that the heart can pump more  blood with less effort. Reducing the work of the heart is important to improve the quality of life for people with heart failure.  PROCEDURE   The ICD is usually placed under the skin near the collarbone, while you are under sedation. An abdominal wall location may be another option.   Pacer wires are inserted into a vein that lies just under the collarbone, then guided into place under x-ray. The tips of the wires touch the inside of the heart. The near end of the pacer wires are connected to the ICD generator (battery pack and pacemaker) under the skin beneath the collarbone.   In thinner chest walled individuals, it is possible to feel the ICD under the skin, and a slight bump may be seen.   The pacer wires or leads report the heart's electrical activity back to the ICD and deliver electrical therapy, if needed.   The ICD can act like a standard pacemaker and pace the heart if  it beats too slowly.  HOME CARE INSTRUCTIONS   Do not shower for a week after the procedure to keep the incision dry.   Use the arm on the side the ICD was placed as little as possible for at least a week.   Keep small electrical devices at least 6 inches away from the ICD and use your cell phone on the opposite side of your body.   Only take over-the-counter or prescription medicines for pain, discomfort, or fever as directed by your caregiver.  IMPORTANT WARNINGS  You must stay away from strong electromagnetic devices. MRI (magnetic resonance imaging) scans cannot be performed on patients with ICD's.   Your ICD must be monitored regularly. A device placed on the skin over the ICD can check (interrogate) the battery or see how and when any heart rhythms were treated.   The programming of the ICD can be modified to best suit your needs. Routine monitoring will detect a battery that is nearing the end of its lifespan long before it gives out. When this happens, the current device is removed and a new device is implanted. The leads are usually left in place.   An ICD is not perfect. It is always important to have the device checked if you feel a shock. The device or your medicine may need to be adjusted.  Document Released: 02/20/2002 Document Revised: 05/20/2011 Document Reviewed: 08/08/2007 [employer] Patient Information 2012 [employer], LCardioverter Defibrillator Implantation The implantable cardioverter defibrillator (ICD) is a device that monitors the heart rhythm. If it detects a dangerous heart rhythm, it either electrically makes the heart beat faster (pacing), or delivers a small electrical shock. The patient may not feel the pacing, but the shock may be felt as a strong jolt in the chest. The ICD is normally used to quickly fix heart rhythms that cannot wait for an ambulance. Rhythms in which the heart has stopped pumping must be changed as quickly as possible. An ICD may be used for  other problems such as:  Less dangerous abnormal heart rhythms.   Assisting weakened heart muscle.   Damage and high risk after a heart attack.  A newer ICD called bi-ventricular (Bi-VICD) uses an extra wire to help both sides of the heart work together. This means that the heart can pump more blood with less effort. Reducing the work of the heart is important to improve the quality of life for people with heart failure.  PROCEDURE   The ICD is usually placed under the skin near  the collarbone, while you are under sedation. An abdominal wall location may be another option.   Pacer wires are inserted into a vein that lies just under the collarbone, then guided into place under x-ray. The tips of the wires touch the inside of the heart. The near end of the pacer wires are connected to the ICD generator (battery pack and pacemaker) under the skin beneath the collarbone.   In thinner chest walled individuals, it is possible to feel the ICD under the skin, and a slight bump may be seen.   The pacer wires or leads report the heart's electrical activity back to the ICD and deliver electrical therapy, if needed.   The ICD can act like a standard pacemaker and pace the heart if it beats too slowly.  HOME CARE INSTRUCTIONS   Do not shower for a week after the procedure to keep the incision dry.   Use the arm on the side the ICD was placed as little as possible for at least a week.   Keep small electrical devices at least 6 inches away from the ICD and use your cell phone on the opposite side of your body.   Only take over-the-counter or prescription medicines for pain, discomfort, or fever as directed by your caregiver.  IMPORTANT WARNINGS  You must stay away from strong electromagnetic devices. MRI (magnetic resonance imaging) scans cannot be performed on patients with ICD's.   Your ICD must be monitored regularly. A device placed on the skin over the ICD can check (interrogate) the battery or  see how and when any heart rhythms were treated.   The programming of the ICD can be modified to best suit your needs. Routine monitoring will detect a battery that is nearing the end of its lifespan long before it gives out. When this happens, the current device is removed and a new device is implanted. The leads are usually left in place.   An ICD is not perfect. It is always important to have the device checked if you feel a shock. The device or your medicine may need to be adjusted.  Document Released: 02/20/2002 Document Revised: 05/20/2011 Document Reviewed: 08/08/2007 [facility] Patient Information 2012 [employer]LC.

## 2011-11-13 NOTE — Progress Notes (Signed)
History of Present Illness: Primary Cardiologist:  Dr. Arvilla Meres Neurlogist: [place]   PCP: Dr. Maurice Small  [name] is a 41 y.o. female with a history of morbid obesity, depression, fibromyalgia, congestive heart failure secondary to nonischemic cardiomyopathy related to her chemotherapy for breast cancer. Last chem 2008. Still in remission.   CPX Feb 2010. Peak VO2 was 14.5 which was 71% of predicted. When corrected for body weight the VO2 was 20.7. The slope was 27. RER 1.20. O2 pulse 73%. Overall this was felt to be only a very mild functional limitation due to her obesity and mild circulatory  CPX 2/11: pVO2 13.0 (72%) correct for ideal wt 68ml/kg/min RER 1.06 (submax) slope 28.2 O2 pulse 91% - felt no signifcant cardiac limitation. + deconditioning.   Echo 04/2009  EF back down to 25%.  RHC normal. Echo 07/2009 showed EF 50%  Echo 06/2010 EF 40%. So we did MUGA EF 58% Echo 12/2010 40-45% 05/2011 EF 30%.  Grade 2 diastolic dysfunction Cath (12/28/10) was set up and demonstrated EF 20-25% and normal coronary arteries.  RA  4, RV 35/11, PA  28/8 (17), PCWP 8. Fick 5.2/2.4 PVR 1.7 Woods. Fick 5.2/2.4 Aortic saturation was 97%.  PA  saturation was 67% and 68%.  Cardiac MRI on 07/19/11: Moderate to severe LVE, Diffuse hypokinesis. EF 32%, Mild LAE  Recently she has had an extensive work-up for stuttering and other neuro abnormalities which was normal. 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  She is here for routine follow up.  She continues to have problems with stuttering and eating sweets.  She is not weighing daily.  She is taking lasix 1-2 times a week due to increased dyspnea.  Lasix works well when she needs it.  Occasional edema.  Occasional dizziness but no syncope.  No chest pain.  She feels like she may have an addiction to sweets as food comforts her.  She is also drinking a lot of fluid throughout the day.  She talked to a  diabetes counselor without much help.  She would like to have lap band surgery.    Past Medical History  Diagnosis Date  . 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 Cardiac MRI 05/2011 EF 32%. ICD implantation 10/2011 (Medtronic)  . Peripheral neuropathy     chemo- induced  . Hypertension     c/b orthostatic hypotention  . Depression   . Palpitation     normal sinus rhythm only on 21 day heart monitor  . Obesity   . Fibromyalgia   . GERD (gastroesophageal reflux disease)   . Heart murmur   . Breast calcifications     right breast  . Interstitial cystitis   . Anxiety   . Nonischemic cardiomyopathy     related to chemo; EF 30% 05/2011  . ICD (implantable cardiac defibrillator) in place   . Pneumonia 2000's    "once"  . Type II diabetes mellitus   . Blood transfusion 1980's    1987 or 1988  . Anemia 1980's    [id number]  . History of stomach ulcers   . Breast cancer     right;   Current Outpatient Prescriptions on File Prior to Encounter  Medication Sig Dispense Refill  . beta carotene w/minerals (OCUVITE) tablet Take 1 tablet by mouth daily.      . carvedilol (  COREG) 25 MG tablet Take 1 tablet (25 mg total) by mouth 2 (two) times daily with a meal.  60 tablet  6  . Cholecalciferol (VITAMIN D3) 5000 UNITS CAPS Take 2 capsules by mouth daily.       . clonazePAM (KLONOPIN) 1 MG tablet Take 1 mg by mouth at bedtime as needed. For sleep      . Cyanocobalamin 2500 MCG SUBL Place 2,500 mcg under the tongue daily.      . digoxin (LANOXIN) 0.125 MG tablet Take 1 tablet (125 mcg total) by mouth daily.  30 tablet  6  . DULoxetine (CYMBALTA) 60 MG capsule Take 60 mg by mouth daily.      Marland Kitchen lisinopril (PRINIVIL,ZESTRIL) 10 MG tablet Take 10 mg by mouth Twice daily.      Marland Kitchen LORazepam (ATIVAN) 1 MG tablet Take 1 mg by mouth 2 (two) times daily as needed. For anxiety      . methocarbamol  (ROBAXIN) 750 MG tablet Take 750 mg by mouth 2 (two) times daily.       . pregabalin (LYRICA) 200 MG capsule Take 200 mg by mouth 3 (three) times daily.       . RABEprazole (ACIPHEX) 20 MG tablet Take 20 mg by mouth daily.        Marland Kitchen spironolactone (ALDACTONE) 25 MG tablet Take 12.5 mg by mouth daily.      . B Complex-C (B-COMPLEX WITH VITAMIN C) tablet Take 1 tablet by mouth daily.      . Coenzyme Q10 (CO Q-10) 100 MG CAPS Take 100 mg by mouth daily.      Marland Kitchen GLUCOSAMINE-CHONDROITIN PO Take 1,200 mg by mouth daily.      . pentosan polysulfate (ELMIRON) 100 MG capsule Take 100 mg by mouth 2 (two) times daily.      . potassium chloride SA (K-DUR,KLOR-CON) 20 MEQ tablet Take 1 tablet (20 mEq total) by mouth daily.  30 tablet  2  . torsemide (DEMADEX) 20 MG tablet Take 1 tablet (20 mg total) by mouth daily.  30 tablet  2     Allergies  Allergen Reactions  . Other Rash    Chloraprep  . Reglan (Metoclopramide Hcl) Anxiety   ROS: All pertinent positives or negatives as in HPI otherwise negative   Vital Signs: Filed Vitals:   10/28/11 1157  BP: 98/60  Pulse: 92  Height: 5\' 7"  (1.702 m)  Weight: 259 lb 12.8 oz (117.845 kg)  SpO2: 98%   PHYSICAL EXAM: Well nourished, well developed, in no acute distress, broken speech HEENT: normal Neck: JVD flat Cardiac:  normal S1, S2; RRR; no murmur, No S3 Lungs:  clear to auscultation bilaterally, no wheezing, rhonchi or rales Abd: obese, soft, nontender, no hepatomegaly Ext: trace edema.   Skin: warm and dry Neuro:  CNs 2-12 intact, no focal abnormalities noted Psych: Normal affect  ASSESSMENT AND PLAN:

## 2011-11-13 NOTE — Assessment & Plan Note (Signed)
From a HF perspective doing relatively well s/p ICD placement. Long discussion about her food addiction and need to try and improve dietary compliance. Also reinforced need for daily weights and reviewed use of sliding scale diuretics. Will keep medicines where they are for now. Given Optivol implant will arrange for for remote monitoring to help manage volume status.

## 2011-11-15 ENCOUNTER — Ambulatory Visit: Payer: Medicare Other

## 2011-11-19 ENCOUNTER — Other Ambulatory Visit (INDEPENDENT_AMBULATORY_CARE_PROVIDER_SITE_OTHER): Payer: Self-pay | Admitting: Surgery

## 2011-11-25 DIAGNOSIS — G541 Lumbosacral plexus disorders: Secondary | ICD-10-CM | POA: Diagnosis not present

## 2011-11-25 DIAGNOSIS — F411 Generalized anxiety disorder: Secondary | ICD-10-CM | POA: Diagnosis not present

## 2011-11-25 DIAGNOSIS — F329 Major depressive disorder, single episode, unspecified: Secondary | ICD-10-CM | POA: Diagnosis not present

## 2011-11-25 DIAGNOSIS — F341 Dysthymic disorder: Secondary | ICD-10-CM | POA: Diagnosis not present

## 2011-12-06 ENCOUNTER — Ambulatory Visit (INDEPENDENT_AMBULATORY_CARE_PROVIDER_SITE_OTHER): Payer: Medicare Other | Admitting: *Deleted

## 2011-12-06 ENCOUNTER — Encounter: Payer: Self-pay | Admitting: Internal Medicine

## 2011-12-06 ENCOUNTER — Encounter: Payer: Self-pay | Admitting: Oncology

## 2011-12-06 DIAGNOSIS — I5022 Chronic systolic (congestive) heart failure: Secondary | ICD-10-CM | POA: Diagnosis not present

## 2011-12-06 DIAGNOSIS — I429 Cardiomyopathy, unspecified: Secondary | ICD-10-CM | POA: Diagnosis not present

## 2011-12-06 LAB — ICD DEVICE OBSERVATION
FVT: 0
HV IMPEDENCE: 66 Ohm
RV LEAD IMPEDENCE ICD: 456 Ohm
RV LEAD THRESHOLD: 0.75 V
TZAT-0001FASTVT: 1
TZAT-0001SLOWVT: 1
TZAT-0001SLOWVT: 2
TZAT-0002FASTVT: NEGATIVE
TZAT-0004SLOWVT: 8
TZAT-0004SLOWVT: 8
TZAT-0012FASTVT: 170 ms
TZAT-0013SLOWVT: 2
TZAT-0013SLOWVT: 3
TZAT-0018FASTVT: NEGATIVE
TZAT-0018SLOWVT: NEGATIVE
TZAT-0019SLOWVT: 8 V
TZAT-0019SLOWVT: 8 V
TZAT-0020SLOWVT: 1.5 ms
TZAT-0020SLOWVT: 1.5 ms
TZON-0003VSLOWVT: 450 ms
TZON-0004SLOWVT: 32
TZON-0004VSLOWVT: 20
TZST-0001FASTVT: 4
TZST-0001FASTVT: 6
TZST-0001SLOWVT: 3
TZST-0001SLOWVT: 5
TZST-0002FASTVT: NEGATIVE
TZST-0002FASTVT: NEGATIVE
TZST-0002FASTVT: NEGATIVE
TZST-0003SLOWVT: 35 J
TZST-0003SLOWVT: 35 J
VF: 0

## 2011-12-06 NOTE — Progress Notes (Signed)
Wound check-ICD 

## 2011-12-23 DIAGNOSIS — F329 Major depressive disorder, single episode, unspecified: Secondary | ICD-10-CM | POA: Diagnosis not present

## 2011-12-23 DIAGNOSIS — G541 Lumbosacral plexus disorders: Secondary | ICD-10-CM | POA: Diagnosis not present

## 2011-12-23 DIAGNOSIS — F411 Generalized anxiety disorder: Secondary | ICD-10-CM | POA: Diagnosis not present

## 2011-12-23 DIAGNOSIS — F341 Dysthymic disorder: Secondary | ICD-10-CM | POA: Diagnosis not present

## 2012-01-06 ENCOUNTER — Telehealth: Payer: Self-pay | Admitting: Internal Medicine

## 2012-01-06 NOTE — Telephone Encounter (Signed)
Initial setup received. Pt aware that we received/kwb

## 2012-01-06 NOTE — Telephone Encounter (Signed)
New msg Pt has questions about her transmission

## 2012-01-12 DIAGNOSIS — M25569 Pain in unspecified knee: Secondary | ICD-10-CM | POA: Diagnosis not present

## 2012-01-25 DIAGNOSIS — G541 Lumbosacral plexus disorders: Secondary | ICD-10-CM | POA: Diagnosis not present

## 2012-01-25 DIAGNOSIS — Z79899 Other long term (current) drug therapy: Secondary | ICD-10-CM | POA: Diagnosis not present

## 2012-01-25 DIAGNOSIS — F329 Major depressive disorder, single episode, unspecified: Secondary | ICD-10-CM | POA: Diagnosis not present

## 2012-01-25 DIAGNOSIS — F411 Generalized anxiety disorder: Secondary | ICD-10-CM | POA: Diagnosis not present

## 2012-01-25 DIAGNOSIS — F341 Dysthymic disorder: Secondary | ICD-10-CM | POA: Diagnosis not present

## 2012-02-09 ENCOUNTER — Ambulatory Visit: Payer: Medicare Other | Admitting: Internal Medicine

## 2012-02-11 ENCOUNTER — Encounter: Payer: Medicare Other | Admitting: Internal Medicine

## 2012-02-15 ENCOUNTER — Encounter: Payer: Medicare Other | Admitting: Internal Medicine

## 2012-03-03 DIAGNOSIS — G541 Lumbosacral plexus disorders: Secondary | ICD-10-CM | POA: Diagnosis not present

## 2012-03-03 DIAGNOSIS — H81399 Other peripheral vertigo, unspecified ear: Secondary | ICD-10-CM | POA: Diagnosis not present

## 2012-03-03 DIAGNOSIS — Z9181 History of falling: Secondary | ICD-10-CM | POA: Diagnosis not present

## 2012-03-03 DIAGNOSIS — R269 Unspecified abnormalities of gait and mobility: Secondary | ICD-10-CM | POA: Diagnosis not present

## 2012-03-03 DIAGNOSIS — F329 Major depressive disorder, single episode, unspecified: Secondary | ICD-10-CM | POA: Diagnosis not present

## 2012-03-03 DIAGNOSIS — F341 Dysthymic disorder: Secondary | ICD-10-CM | POA: Diagnosis not present

## 2012-03-03 DIAGNOSIS — F411 Generalized anxiety disorder: Secondary | ICD-10-CM | POA: Diagnosis not present

## 2012-03-14 ENCOUNTER — Ambulatory Visit (INDEPENDENT_AMBULATORY_CARE_PROVIDER_SITE_OTHER): Payer: Medicare Other | Admitting: Internal Medicine

## 2012-03-14 ENCOUNTER — Encounter: Payer: Self-pay | Admitting: Internal Medicine

## 2012-03-14 VITALS — BP 93/62 | HR 90 | Ht 67.0 in | Wt 263.8 lb

## 2012-03-14 DIAGNOSIS — E66813 Obesity, class 3: Secondary | ICD-10-CM

## 2012-03-14 DIAGNOSIS — I5022 Chronic systolic (congestive) heart failure: Secondary | ICD-10-CM | POA: Diagnosis not present

## 2012-03-14 DIAGNOSIS — L910 Hypertrophic scar: Secondary | ICD-10-CM | POA: Diagnosis not present

## 2012-03-14 DIAGNOSIS — Z95810 Presence of automatic (implantable) cardiac defibrillator: Secondary | ICD-10-CM | POA: Diagnosis not present

## 2012-03-14 DIAGNOSIS — I429 Cardiomyopathy, unspecified: Secondary | ICD-10-CM | POA: Diagnosis not present

## 2012-03-14 LAB — ICD DEVICE OBSERVATION
BRDY-0002RV: 40 {beats}/min
HV IMPEDENCE: 65 Ohm
RV LEAD AMPLITUDE: 20 mv
RV LEAD IMPEDENCE ICD: 475 Ohm
TZAT-0004SLOWVT: 8
TZAT-0004SLOWVT: 8
TZAT-0005SLOWVT: 88 pct
TZAT-0005SLOWVT: 91 pct
TZAT-0011SLOWVT: 10 ms
TZAT-0011SLOWVT: 10 ms
TZAT-0012FASTVT: 170 ms
TZAT-0012SLOWVT: 170 ms
TZAT-0012SLOWVT: 170 ms
TZAT-0019FASTVT: 8 V
TZAT-0019SLOWVT: 8 V
TZAT-0020FASTVT: 1.5 ms
TZON-0003SLOWVT: 300 ms
TZON-0003VSLOWVT: 450 ms
TZST-0001FASTVT: 2
TZST-0001FASTVT: 5
TZST-0001SLOWVT: 4
TZST-0002FASTVT: NEGATIVE
TZST-0002FASTVT: NEGATIVE
TZST-0002FASTVT: NEGATIVE
TZST-0003SLOWVT: 35 J
TZST-0003SLOWVT: 35 J
VENTRICULAR PACING ICD: 0.1 pct

## 2012-03-14 LAB — BASIC METABOLIC PANEL
CO2: 26 mEq/L (ref 19–32)
Calcium: 9.2 mg/dL (ref 8.4–10.5)
GFR: 79.23 mL/min (ref >60.00)
Potassium: 4 mEq/L (ref 3.5–5.1)
Sodium: 139 mEq/L (ref 135–145)

## 2012-03-14 NOTE — Assessment & Plan Note (Addendum)
We'll check her digoxin level and metabolic profile today. She is euvolmic

## 2012-03-14 NOTE — Progress Notes (Signed)
Patient Care Team: Maurice Small, MD as PCP - General (Family Medicine)   HPI  [name] is a 41 y.o. female Seen in followup for an ICD implanted in May 2013 for primary prevention with nonischemic cardiomyopathy and a QRS  She is doing relatively well with stable shortness of breath. She does not have peripheral edema.  She is interested in bariatric surgery.  Past Medical History  Diagnosis Date  . 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 Cardiac MRI 05/2011 EF 32%. ICD implantation 10/2011 ([employer])  . Peripheral neuropathy     chemo- induced  . Hypertension     c/b orthostatic hypotention  . Depression   . Palpitation     normal sinus rhythm only on 21 day heart monitor  . Obesity   . Fibromyalgia   . GERD (gastroesophageal reflux disease)   . Heart murmur   . Breast calcifications     right breast  . Interstitial cystitis   . Anxiety   . Nonischemic cardiomyopathy     related to chemo; EF 30% 05/2011  . ICD (implantable cardiac defibrillator) in place   . Pneumonia 2000's    "once"  . Type II diabetes mellitus   . Blood transfusion 1980's    1987 or 1988  . Anemia 1980's    [id number]  . History of stomach ulcers   . Breast cancer     right;    Past Surgical History  Procedure Date  . Laparoscopic endometriosis fulguration 1990's  . Cardiac defibrillator placement 11/03/11  . Tonsillectomy 1980's  . Tubal ligation 1990's  . Vaginal hysterectomy 2000's  . Cholecystectomy ~ 2009  . Breast lumpectomy 2008; 2013    right  . Port-a-cath removal 2010?    left chest; placed in 2008    Current Outpatient Prescriptions  Medication Sig Dispense Refill  . B Complex-C (B-COMPLEX WITH VITAMIN C) tablet Take 1 tablet by mouth daily.      . beta carotene w/minerals (OCUVITE) tablet Take 1 tablet by mouth daily.      . carvedilol (COREG) 25 MG tablet Take  1 tablet (25 mg total) by mouth 2 (two) times daily with a meal.  60 tablet  6  . Cholecalciferol (VITAMIN D3) 5000 UNITS CAPS Take 1 capsule by mouth daily.       . clonazePAM (KLONOPIN) 1 MG tablet Take 1 mg by mouth at bedtime as needed. For sleep      . Coenzyme Q10 (CO Q-10) 100 MG CAPS Take 100 mg by mouth daily.      . Cyanocobalamin 2500 MCG SUBL Place 2,500 mcg under the tongue daily.      . digoxin (LANOXIN) 0.125 MG tablet Take 1 tablet (125 mcg total) by mouth daily.  30 tablet  6  . DULoxetine (CYMBALTA) 60 MG capsule Take 60 mg by mouth daily.      Marland Kitchen GLUCOSAMINE-CHONDROITIN PO Take 1,200 mg by mouth daily.      . hydrocortisone 2.5 % lotion as needed. Apply to affected area 2 times daily      . hydrocortisone 2.5 % lotion APPLY TO AFFECTED AREA(S) TWICE DAILY AS DIRECTED  59 mL  0  . lisinopril (PRINIVIL,ZESTRIL) 10 MG tablet Take 10 mg by mouth Twice daily.      Marland Kitchen LORazepam (ATIVAN) 1 MG tablet Take 1 mg by mouth 2 (two) times daily  as needed. For anxiety      . methocarbamol (ROBAXIN) 750 MG tablet Take 750 mg by mouth 2 (two) times daily.       Marland Kitchen oxyCODONE-acetaminophen (PERCOCET) 10-325 MG per tablet as directed.       . pentosan polysulfate (ELMIRON) 100 MG capsule Take 100 mg by mouth 2 (two) times daily.      . potassium chloride SA (K-DUR,KLOR-CON) 20 MEQ tablet Take 20 mEq by mouth daily. With fluid pill      . pregabalin (LYRICA) 200 MG capsule Take 200 mg by mouth 3 (three) times daily.       . RABEprazole (ACIPHEX) 20 MG tablet Take 20 mg by mouth daily.        Marland Kitchen spironolactone (ALDACTONE) 25 MG tablet Take 12.5 mg by mouth daily.      Marland Kitchen torsemide (DEMADEX) 20 MG tablet Take 20 mg by mouth as needed. As needed      . zolpidem (AMBIEN CR) 12.5 MG CR tablet Take 12.5 mg by mouth.         Allergies  Allergen Reactions  . Other Rash    Chloraprep  . Reglan (Metoclopramide Hcl) Anxiety    Review of Systems negative except from HPI and PMH  Physical Exam BP 93/62   Pulse 90  Ht 5\' 7"  (1.702 m)  Wt 263 lb 12.8 oz (119.659 kg)  BMI 41.32 kg/m2 Well developed and well nourished in no acute distress HENT normal E scleral and icterus clear Neck Supple JVP flat; carotids brisk and full Device pocket well healed; without hematoma or erythema There is a keloid Clear to ausculation  Regular rate and rhythm, no murmurs gallops or rub Soft with active bowel sounds No clubbing cyanosis none Edema Alert and oriented, grossly normal motor and sensory function Skin Warm and Dry  Electrocardiogram demonstrates sinus rhythm 91 Intervals 18/09/39 Axis is -15  Assessment and  Plan

## 2012-03-14 NOTE — Assessment & Plan Note (Signed)
The patient's device was interrogated.  The information was reviewed. No changes were made in the programming.    

## 2012-03-14 NOTE — Assessment & Plan Note (Signed)
She is interested in considering bariatric surgery. We'll have her discuss this with Dr. Dorthea Cove

## 2012-03-14 NOTE — Patient Instructions (Addendum)
Remote monitoring is used to monitor your Pacemaker of ICD from home. This monitoring reduces the number of office visits required to check your device to one time per year. It allows [place] to keep an eye on the functioning of your device to ensure it is working properly. You are scheduled for a device check from home on June 19, 2012. You may send your transmission at any time that day. If you have a wireless device, the transmission will be sent automatically. After your physician reviews your transmission, you will receive a postcard with your next transmission date.  Your physician wants you to follow-up in: 1 year with Dr Graciela Husbands.  You will receive a reminder letter in the mail two months in advance. If you don't  receive a letter, please call our office to schedule the follow-up appointment.  LABS TODAY:  bmet and digoxin.  Your physician recommends that you schedule a follow-up appointment in: 6-8 weeks with Dr. Gala Romney in the [facility].

## 2012-03-14 NOTE — Assessment & Plan Note (Signed)
This has evolved at the site of her device implantation scar. I've given her the name of the plastic surgeons might be able to help with steroid injection

## 2012-03-14 NOTE — Assessment & Plan Note (Signed)
On guideline directed medical therapy; up titration is limited blood pressure and orthostasis

## 2012-03-16 DIAGNOSIS — L219 Seborrheic dermatitis, unspecified: Secondary | ICD-10-CM | POA: Diagnosis not present

## 2012-03-31 DIAGNOSIS — G541 Lumbosacral plexus disorders: Secondary | ICD-10-CM | POA: Diagnosis not present

## 2012-03-31 DIAGNOSIS — F329 Major depressive disorder, single episode, unspecified: Secondary | ICD-10-CM | POA: Diagnosis not present

## 2012-03-31 DIAGNOSIS — F411 Generalized anxiety disorder: Secondary | ICD-10-CM | POA: Diagnosis not present

## 2012-03-31 DIAGNOSIS — F341 Dysthymic disorder: Secondary | ICD-10-CM | POA: Diagnosis not present

## 2012-04-24 ENCOUNTER — Other Ambulatory Visit (HOSPITAL_COMMUNITY): Payer: Self-pay | Admitting: *Deleted

## 2012-04-24 MED ORDER — CARVEDILOL 25 MG PO TABS: 25.0000 mg | ORAL_TABLET | Freq: Two times a day (BID) | ORAL | Status: DC

## 2012-04-24 MED ORDER — DIGOXIN 125 MCG PO TABS: 125.0000 ug | ORAL_TABLET | Freq: Every day | ORAL | Status: DC

## 2012-04-24 MED ORDER — LISINOPRIL 10 MG PO TABS: 10.0000 mg | ORAL_TABLET | Freq: Two times a day (BID) | ORAL | Status: DC

## 2012-04-28 DIAGNOSIS — M542 Cervicalgia: Secondary | ICD-10-CM | POA: Diagnosis not present

## 2012-04-28 DIAGNOSIS — F329 Major depressive disorder, single episode, unspecified: Secondary | ICD-10-CM | POA: Diagnosis not present

## 2012-04-28 DIAGNOSIS — F341 Dysthymic disorder: Secondary | ICD-10-CM | POA: Diagnosis not present

## 2012-04-28 DIAGNOSIS — F411 Generalized anxiety disorder: Secondary | ICD-10-CM | POA: Diagnosis not present

## 2012-04-28 DIAGNOSIS — Z79899 Other long term (current) drug therapy: Secondary | ICD-10-CM | POA: Diagnosis not present

## 2012-04-28 DIAGNOSIS — G541 Lumbosacral plexus disorders: Secondary | ICD-10-CM | POA: Diagnosis not present

## 2012-05-01 ENCOUNTER — Ambulatory Visit (HOSPITAL_COMMUNITY): Payer: Medicare Other | Attending: Internal Medicine

## 2012-05-17 ENCOUNTER — Ambulatory Visit (HOSPITAL_COMMUNITY)
Admission: RE | Admit: 2012-05-17 | Discharge: 2012-05-17 | Disposition: A | Discharge status: Home / Self Care | Payer: Medicare Other | Source: Ambulatory Visit | Attending: Internal Medicine | Admitting: Internal Medicine

## 2012-05-17 ENCOUNTER — Encounter (HOSPITAL_COMMUNITY): Payer: Self-pay

## 2012-05-17 VITALS — BP 94/62 | HR 88 | Wt 259.0 lb

## 2012-05-17 DIAGNOSIS — R079 Chest pain, unspecified: Secondary | ICD-10-CM | POA: Diagnosis not present

## 2012-05-17 DIAGNOSIS — I5022 Chronic systolic (congestive) heart failure: Secondary | ICD-10-CM | POA: Diagnosis not present

## 2012-05-17 DIAGNOSIS — I959 Hypotension, unspecified: Secondary | ICD-10-CM

## 2012-05-17 NOTE — Progress Notes (Addendum)
History of Present Illness: Primary Cardiologist:  Dr. Arvilla Meres Neurlogist: [place]   PCP: Dr. Maurice Small  [name] is a 41 y.o. female with a history of morbid obesity, depression, fibromyalgia, congestive heart failure secondary to nonischemic cardiomyopathy related to her chemotherapy for breast cancer. Last chem 2008. Still in remission.   CPX Feb 2010. Peak VO2 was 14.5 which was 71% of predicted. When corrected for body weight the VO2 was 20.7. The slope was 27. RER 1.20. O2 pulse 73%. Overall this was felt to be only a very mild functional limitation due to her obesity and mild circulatory  CPX 2/11: pVO2 13.0 (72%) correct for ideal wt 22ml/kg/min RER 1.06 (submax) slope 28.2 O2 pulse 91% - felt no signifcant cardiac limitation. + deconditioning.   Echo 04/2009  EF back down to 25%.  RHC normal. Echo 07/2009 showed EF 50%  Echo 06/2010 EF 40%. So we did MUGA EF 58% Echo 12/2010 40-45% 05/2011 EF 30%.  Grade 2 diastolic dysfunction Cath (12/28/10) was set up and demonstrated EF 20-25% and normal coronary arteries.  RA  4, RV 35/11, PA  28/8 (17), PCWP 8. Fick 5.2/2.4 PVR 1.7 Woods. Fick 5.2/2.4 Aortic saturation was 97%.  PA  saturation was 67% and 68%.  Cardiac MRI on 07/19/11: Moderate to severe LVE, Diffuse hypokinesis. EF 32%, Mild LAE  Recently she has had an extensive work-up for stuttering and other neuro abnormalities which was normal. 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  She is here for routine follow up.  She continues to have problems with stuttering and eating sweets.  She is not weighing daily.  She is taking lasix 1-2 times a week due to increased dyspnea.  Lasix works well when she needs it.  Occasional edema.  Occasional dizziness but no syncope.  No chest pain.  She feels like she may have an addiction to sweets as food comforts her.  She is also drinking a lot of fluid throughout the day.  She talked to a  diabetes counselor without much help.  She would like to have lap band surgery.    She is here for follow up with her sister and mother.  She feels ok.  She has had fatigue since chemo but this maybe getting worse.  She has periods of shortness of breath that is resolved with demadex (only takes prn).  She is taking demadex 2-3x per week.  Occ lightheadedness with standing.  No syncope.  Off and on lower extremity edema.  She had a dull aching pain in her chest off and on for an hour the other night.  She does not have this pain with ambulation or has not had this before.  Compliant with meds.  She is interested in bariatric surgery.    Optival today: Slight increase in fluid index above baseline but parallel to baseline and not close to passing threshold.  Activity level on decline from 3 to 2.5 but stable. No ICD shocks.   Past Medical History  Diagnosis Date  . 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 Cardiac MRI 05/2011 EF 32%. ICD implantation 10/2011 ([employer])  . Peripheral neuropathy     chemo- induced  . Hypertension     c/b orthostatic hypotention  . Depression   . Palpitation     normal sinus rhythm only on 21 day  heart monitor  . Obesity   . Fibromyalgia   . GERD (gastroesophageal reflux disease)   . Heart murmur   . Breast calcifications     right breast  . Interstitial cystitis   . Anxiety   . Nonischemic cardiomyopathy     related to chemo; EF 30% 05/2011  . ICD (implantable cardiac defibrillator) in place   . Pneumonia 2000's    "once"  . Type II diabetes mellitus   . Blood transfusion 1980's    1987 or 1988  . Anemia 1980's    [id number]  . History of stomach ulcers   . Breast cancer     right;   Current Outpatient Prescriptions on File Prior to Encounter  Medication Sig Dispense Refill  . B Complex-C (B-COMPLEX WITH VITAMIN C) tablet Take 1 tablet by mouth  daily.      . beta carotene w/minerals (OCUVITE) tablet Take 1 tablet by mouth daily.      . carvedilol (COREG) 25 MG tablet Take 1 tablet (25 mg total) by mouth 2 (two) times daily with a meal.  60 tablet  6  . Cholecalciferol (VITAMIN D3) 5000 UNITS CAPS Take 1 capsule by mouth daily.       . clonazePAM (KLONOPIN) 1 MG tablet Take 1 mg by mouth at bedtime as needed. For sleep      . Coenzyme Q10 (CO Q-10) 100 MG CAPS Take 100 mg by mouth daily.      . Cyanocobalamin 2500 MCG SUBL Place 2,500 mcg under the tongue daily.      . digoxin (LANOXIN) 0.125 MG tablet Take 1 tablet (125 mcg total) by mouth daily.  30 tablet  6  . DULoxetine (CYMBALTA) 60 MG capsule Take 60 mg by mouth daily.      Marland Kitchen GLUCOSAMINE-CHONDROITIN PO Take 1,200 mg by mouth daily.      . hydrocortisone 2.5 % lotion as needed. Apply to affected area 2 times daily      . lisinopril (PRINIVIL,ZESTRIL) 10 MG tablet Take 1 tablet (10 mg total) by mouth 2 (two) times daily.  60 tablet  6  . LORazepam (ATIVAN) 1 MG tablet Take 1 mg by mouth 2 (two) times daily as needed. For anxiety      . methocarbamol (ROBAXIN) 750 MG tablet Take 750 mg by mouth 2 (two) times daily.       Marland Kitchen oxyCODONE-acetaminophen (PERCOCET) 10-325 MG per tablet as directed.       . pentosan polysulfate (ELMIRON) 100 MG capsule Take 100 mg by mouth 2 (two) times daily.      . potassium chloride SA (K-DUR,KLOR-CON) 20 MEQ tablet Take 20 mEq by mouth daily. With fluid pill      . pregabalin (LYRICA) 200 MG capsule Take 200 mg by mouth 3 (three) times daily.       . RABEprazole (ACIPHEX) 20 MG tablet Take 20 mg by mouth daily.        Marland Kitchen spironolactone (ALDACTONE) 25 MG tablet Take 12.5 mg by mouth daily.      Marland Kitchen torsemide (DEMADEX) 20 MG tablet Take 20 mg by mouth as needed. As needed      . zolpidem (AMBIEN CR) 12.5 MG CR tablet Take 12.5 mg by mouth.       . hydrocortisone 2.5 % lotion APPLY TO AFFECTED AREA(S) TWICE DAILY AS DIRECTED  59 mL  0     Allergies   Allergen Reactions  . Other Rash    Chloraprep  .  Reglan (Metoclopramide Hcl) Anxiety   ROS: All pertinent positives or negatives as in HPI otherwise negative   Vital Signs: Filed Vitals:   05/17/12 1138  BP: 94/62  Pulse: 88  Weight: 259 lb (117.482 kg)  SpO2: 96%   PHYSICAL EXAM: Well nourished, well developed, in no acute distress, broken speech HEENT: normal Neck: JVP 5-6 Cardiac:  normal S1, S2; RRR; no murmur, No S3 Lungs:  clear to auscultation bilaterally, no wheezing, rhonchi or rales Abd: obese, soft, nontender, no hepatomegaly Ext: trace edema.   Skin: warm and dry Neuro:  CNs 2-12 intact, no focal abnormalities noted Psych: Normal affect  ASSESSMENT AND PLAN:

## 2012-05-18 DIAGNOSIS — I959 Hypotension, unspecified: Secondary | ICD-10-CM | POA: Insufficient documentation

## 2012-05-18 NOTE — Assessment & Plan Note (Signed)
Chest pain was atypical in nature and occurred once at rest.  Will hold off on further ischemic work up at this time unless pain presents itself again.

## 2012-05-18 NOTE — Assessment & Plan Note (Signed)
Currently asymptomatic.  Will not titrate meds further at this time.  She will continue to follow BP at home.

## 2012-05-18 NOTE — Assessment & Plan Note (Addendum)
NYHA II-III.  Have reviewed Optival tracing.  She has minimal volume on board.  Will start demadex 20 mg on M and F with instructions to increase to M, W, F if weight continues to fluctuate.  No med titration due to hypotension. She is interested in bariatric surgery at this time.  I have ordered a repeat echo as it has been over a year since EF has be assessed.  I will review echo and discuss bariatric surgery with Dr. Gala Romney.  She may require further cardiac work up if eligible for surgery.  She has been enrolled in our [employer] Remote Monitoring Program to help manage volume.

## 2012-05-24 ENCOUNTER — Ambulatory Visit (HOSPITAL_COMMUNITY)
Admission: RE | Admit: 2012-05-24 | Discharge: 2012-05-24 | Disposition: A | Discharge status: Home / Self Care | Payer: Medicare Other | Source: Ambulatory Visit | Attending: Internal Medicine | Admitting: Internal Medicine

## 2012-05-24 DIAGNOSIS — E119 Type 2 diabetes mellitus without complications: Secondary | ICD-10-CM | POA: Diagnosis not present

## 2012-05-24 DIAGNOSIS — R079 Chest pain, unspecified: Secondary | ICD-10-CM | POA: Insufficient documentation

## 2012-05-24 DIAGNOSIS — I10 Essential (primary) hypertension: Secondary | ICD-10-CM | POA: Diagnosis not present

## 2012-05-24 DIAGNOSIS — I509 Heart failure, unspecified: Secondary | ICD-10-CM | POA: Diagnosis not present

## 2012-05-24 DIAGNOSIS — I517 Cardiomegaly: Secondary | ICD-10-CM | POA: Insufficient documentation

## 2012-05-24 DIAGNOSIS — I059 Rheumatic mitral valve disease, unspecified: Secondary | ICD-10-CM | POA: Diagnosis not present

## 2012-05-24 DIAGNOSIS — I359 Nonrheumatic aortic valve disorder, unspecified: Secondary | ICD-10-CM | POA: Insufficient documentation

## 2012-05-24 DIAGNOSIS — I5022 Chronic systolic (congestive) heart failure: Secondary | ICD-10-CM

## 2012-05-24 NOTE — Progress Notes (Signed)
  Echocardiogram 2D Echocardiogram has been performed.  [name] 05/24/2012, 3:51 PM

## 2012-05-29 DIAGNOSIS — F341 Dysthymic disorder: Secondary | ICD-10-CM | POA: Diagnosis not present

## 2012-05-29 DIAGNOSIS — G541 Lumbosacral plexus disorders: Secondary | ICD-10-CM | POA: Diagnosis not present

## 2012-05-29 DIAGNOSIS — F329 Major depressive disorder, single episode, unspecified: Secondary | ICD-10-CM | POA: Diagnosis not present

## 2012-05-29 DIAGNOSIS — F411 Generalized anxiety disorder: Secondary | ICD-10-CM | POA: Diagnosis not present

## 2012-05-30 ENCOUNTER — Emergency Department (HOSPITAL_COMMUNITY)
Admission: EM | Admit: 2012-05-30 | Discharge: 2012-05-30 | Disposition: A | Discharge status: Home / Self Care | Payer: Medicare Other | Attending: Emergency Medicine | Admitting: Emergency Medicine

## 2012-05-30 ENCOUNTER — Encounter (HOSPITAL_COMMUNITY): Payer: Self-pay | Admitting: Emergency Medicine

## 2012-05-30 ENCOUNTER — Other Ambulatory Visit (HOSPITAL_COMMUNITY): Payer: Self-pay | Admitting: *Deleted

## 2012-05-30 ENCOUNTER — Emergency Department (HOSPITAL_COMMUNITY): Payer: Medicare Other

## 2012-05-30 DIAGNOSIS — R0602 Shortness of breath: Secondary | ICD-10-CM | POA: Diagnosis not present

## 2012-05-30 DIAGNOSIS — F3289 Other specified depressive episodes: Secondary | ICD-10-CM | POA: Insufficient documentation

## 2012-05-30 DIAGNOSIS — I10 Essential (primary) hypertension: Secondary | ICD-10-CM | POA: Insufficient documentation

## 2012-05-30 DIAGNOSIS — K219 Gastro-esophageal reflux disease without esophagitis: Secondary | ICD-10-CM | POA: Insufficient documentation

## 2012-05-30 DIAGNOSIS — E119 Type 2 diabetes mellitus without complications: Secondary | ICD-10-CM | POA: Insufficient documentation

## 2012-05-30 DIAGNOSIS — Z8711 Personal history of peptic ulcer disease: Secondary | ICD-10-CM | POA: Insufficient documentation

## 2012-05-30 DIAGNOSIS — Z8701 Personal history of pneumonia (recurrent): Secondary | ICD-10-CM | POA: Diagnosis not present

## 2012-05-30 DIAGNOSIS — R011 Cardiac murmur, unspecified: Secondary | ICD-10-CM | POA: Insufficient documentation

## 2012-05-30 DIAGNOSIS — I509 Heart failure, unspecified: Secondary | ICD-10-CM | POA: Insufficient documentation

## 2012-05-30 DIAGNOSIS — Z862 Personal history of diseases of the blood and blood-forming organs and certain disorders involving the immune mechanism: Secondary | ICD-10-CM | POA: Diagnosis not present

## 2012-05-30 DIAGNOSIS — Z79899 Other long term (current) drug therapy: Secondary | ICD-10-CM | POA: Insufficient documentation

## 2012-05-30 DIAGNOSIS — F329 Major depressive disorder, single episode, unspecified: Secondary | ICD-10-CM | POA: Diagnosis not present

## 2012-05-30 DIAGNOSIS — R079 Chest pain, unspecified: Secondary | ICD-10-CM | POA: Diagnosis not present

## 2012-05-30 DIAGNOSIS — Z8719 Personal history of other diseases of the digestive system: Secondary | ICD-10-CM | POA: Insufficient documentation

## 2012-05-30 DIAGNOSIS — E669 Obesity, unspecified: Secondary | ICD-10-CM | POA: Insufficient documentation

## 2012-05-30 DIAGNOSIS — Z8679 Personal history of other diseases of the circulatory system: Secondary | ICD-10-CM | POA: Diagnosis not present

## 2012-05-30 DIAGNOSIS — F411 Generalized anxiety disorder: Secondary | ICD-10-CM | POA: Insufficient documentation

## 2012-05-30 DIAGNOSIS — Z95810 Presence of automatic (implantable) cardiac defibrillator: Secondary | ICD-10-CM | POA: Diagnosis not present

## 2012-05-30 DIAGNOSIS — Z853 Personal history of malignant neoplasm of breast: Secondary | ICD-10-CM | POA: Diagnosis not present

## 2012-05-30 DIAGNOSIS — Z8669 Personal history of other diseases of the nervous system and sense organs: Secondary | ICD-10-CM | POA: Diagnosis not present

## 2012-05-30 DIAGNOSIS — R072 Precordial pain: Secondary | ICD-10-CM | POA: Diagnosis not present

## 2012-05-30 LAB — CBC WITH DIFFERENTIAL/PLATELET
Basophils Absolute: 0 10*3/uL (ref 0.0–0.1)
HCT: 37.6 % (ref 36.0–46.0)
Hemoglobin: 12.2 g/dL (ref 12.0–15.0)
Lymphocytes Relative: 35 % (ref 12–46)
Lymphs Abs: 3.8 10*3/uL (ref 0.7–4.0)
MCV: 87.9 fL (ref 78.0–100.0)
Monocytes Absolute: 0.8 10*3/uL (ref 0.1–1.0)
Monocytes Relative: 7 % (ref 3–12)
Neutro Abs: 6.2 10*3/uL (ref 1.7–7.7)
RBC: 4.28 MIL/uL (ref 3.87–5.11)
RDW: 13.6 % (ref 11.5–15.5)
WBC: 10.9 10*3/uL — ABNORMAL HIGH (ref 4.0–10.5)

## 2012-05-30 LAB — COMPREHENSIVE METABOLIC PANEL
ALT: 36 U/L — ABNORMAL HIGH (ref 0–35)
AST: 26 U/L (ref 0–37)
CO2: 24 mEq/L (ref 19–32)
Chloride: 92 mEq/L — ABNORMAL LOW (ref 96–112)
Creatinine, Ser: 1.17 mg/dL — ABNORMAL HIGH (ref 0.50–1.10)
GFR calc Af Amer: 66 mL/min — ABNORMAL LOW (ref >90)
GFR calc non Af Amer: 57 mL/min — ABNORMAL LOW (ref >90)
Glucose, Bld: 116 mg/dL — ABNORMAL HIGH (ref 70–99)
Total Bilirubin: 0.6 mg/dL (ref 0.3–1.2)

## 2012-05-30 LAB — POCT I-STAT TROPONIN I

## 2012-05-30 MED ORDER — SPIRONOLACTONE 25 MG PO TABS: 12.5000 mg | ORAL_TABLET | Freq: Every day | ORAL | Status: DC

## 2012-05-30 MED ORDER — FAMOTIDINE 20 MG PO TABS
20.0000 mg | ORAL_TABLET | Freq: Once | ORAL | Status: AC
  Administered 2012-05-30: 20 mg via ORAL
  Filled 2012-05-30: qty 1

## 2012-05-30 MED ORDER — GI COCKTAIL ~~LOC~~
30.0000 mL | Freq: Once | ORAL | Status: AC
  Administered 2012-05-30: 30 mL via ORAL
  Filled 2012-05-30: qty 30

## 2012-05-30 NOTE — ED Notes (Signed)
PT. REPORTS MID CHEST " BURNING" ONSET THIS EVENING AND BILATERAL EYES "BURNING " ONSET THIS EVENING , DENIES SOB OR NAUSEA , STATES SEEN BY HER PAIN MANAGEMENT MD FOR FIBROMYALGIA, INTERSTITIAL CYSTITIS AND CONVERSION DISORDER PRESCRIBED WITH CYMBALTA AND OXYCODONE .

## 2012-05-30 NOTE — ED Provider Notes (Signed)
History     CSN: [id number]  Arrival date & time 05/30/12  0015   First MD Initiated Contact with Patient 05/30/12 0038      Chief Complaint  Patient presents with  . Chest Pain    (Consider location/radiation/quality/duration/timing/severity/associated sxs/prior treatment) Patient is a 41 y.o. female presenting with chest pain. The history is provided by the patient.  Chest Pain Pertinent negatives for primary symptoms include no fever, no shortness of breath, no cough, no palpitations and no abdominal pain.   pt with hx fibromyalgia, cardiomyopathy c/o mid chest burning for past day. Constant. Dull. Non radiating. Not pleuritic. No neck, arm or jaw or back pain. At rest. No change w position changes, activity or exertion. No associated sob, nv or diaphoresis. Pain not pleuritic. No leg pain or swelling. No immobility, trauma, recent surgery, no hx dvt or pe. ?hx gerd. Denies cough or uri c/o. No fever or chills. w pain, denies specific exacerbating or alleviating factors. No hx cad. No fam hx premature cad. No cocaine use.      Past Medical History  Diagnosis Date  . 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 Cardiac MRI 05/2011 EF 32%. ICD implantation 10/2011 ([employer])  . Peripheral neuropathy     chemo- induced  . Hypertension     c/b orthostatic hypotention  . Depression   . Palpitation     normal sinus rhythm only on 21 day heart monitor  . Obesity   . Fibromyalgia   . GERD (gastroesophageal reflux disease)   . Heart murmur   . Breast calcifications     right breast  . Interstitial cystitis   . Anxiety   . Nonischemic cardiomyopathy     related to chemo; EF 30% 05/2011  . ICD (implantable cardiac defibrillator) in place   . Pneumonia 2000's    "once"  . Type II diabetes mellitus   . Blood transfusion 1980's    1987 or 1988  . Anemia 1980's    [id number]  .  History of stomach ulcers   . Breast cancer     right;    Past Surgical History  Procedure Date  . Laparoscopic endometriosis fulguration 1990's  . Cardiac defibrillator placement 11/03/11  . Tonsillectomy 1980's  . Tubal ligation 1990's  . Vaginal hysterectomy 2000's  . Cholecystectomy ~ 2009  . Breast lumpectomy 2008; 2013    right  . Port-a-cath removal 2010?    left chest; placed in 2008    Family History  Problem Relation Age of Onset  . Coronary artery disease    . Heart attack    . Heart disease Mother   . Heart disease Maternal Uncle   . Cancer Paternal Uncle     colon  . Heart disease Maternal Grandmother   . Cancer Paternal Grandmother     ovarian    History  Substance Use Topics  . Smoking status: Never Smoker   . Smokeless tobacco: Never Used  . Alcohol Use: Yes     Comment: 11/03/11 "maybe once a month"    OB History    Grav Para Term Preterm Abortions TAB SAB Ect Mult Living                  Review of Systems  Constitutional: Negative for fever and chills.  HENT: Negative for neck pain.   Eyes: Negative for redness.  Respiratory:  Negative for cough and shortness of breath.   Cardiovascular: Positive for chest pain. Negative for palpitations and leg swelling.  Gastrointestinal: Negative for abdominal pain.  Genitourinary: Negative for flank pain.  Musculoskeletal: Negative for back pain.  Skin: Negative for rash.  Neurological: Negative for syncope and headaches.  Hematological: Does not bruise/bleed easily.  Psychiatric/Behavioral: Negative for confusion.    Allergies  Other and Reglan  Home Medications   Current Outpatient Rx  Name  Route  Sig  Dispense  Refill  . B COMPLEX-C PO TABS   Oral   Take 1 tablet by mouth daily.         . OCUVITE PO TABS   Oral   Take 1 tablet by mouth daily.         Marland Kitchen CARVEDILOL 25 MG PO TABS   Oral   Take 1 tablet (25 mg total) by mouth 2 (two) times daily with a meal.   60 tablet   6   .  VITAMIN D3 5000 UNITS PO CAPS   Oral   Take 1 capsule by mouth daily.          Marland Kitchen CLONAZEPAM 1 MG PO TABS   Oral   Take 1 mg by mouth at bedtime as needed. For sleep         . CO Q-10 100 MG PO CAPS   Oral   Take 100 mg by mouth daily.         . CYANOCOBALAMIN 2500 MCG SL SUBL   Sublingual   Place 2,500 mcg under the tongue daily.         Marland Kitchen DIGOXIN 0.125 MG PO TABS   Oral   Take 1 tablet (125 mcg total) by mouth daily.   30 tablet   6   . DULOXETINE HCL 60 MG PO CPEP   Oral   Take 60 mg by mouth daily.         Marland Kitchen GLUCOSAMINE-CHONDROITIN PO   Oral   Take 1,200 mg by mouth daily.         Marland Kitchen HYDROCORTISONE 2.5 % EX LOTN      as needed. Apply to affected area 2 times daily         . HYDROCORTISONE 2.5 % EX LOTN      APPLY TO AFFECTED AREA(S) TWICE DAILY AS DIRECTED   59 mL   0   . LISINOPRIL 10 MG PO TABS   Oral   Take 1 tablet (10 mg total) by mouth 2 (two) times daily.   60 tablet   6   . LORAZEPAM 1 MG PO TABS   Oral   Take 1 mg by mouth 2 (two) times daily as needed. For anxiety         . METHOCARBAMOL 750 MG PO TABS   Oral   Take 750 mg by mouth 2 (two) times daily.          . OXYCODONE-ACETAMINOPHEN 10-325 MG PO TABS      as directed.          Marland Kitchen PENTOSAN POLYSULFATE SODIUM 100 MG PO CAPS   Oral   Take 100 mg by mouth 2 (two) times daily.         Marland Kitchen POTASSIUM CHLORIDE CRYS ER 20 MEQ PO TBCR   Oral   Take 20 mEq by mouth daily. With fluid pill         . PREGABALIN 200 MG PO CAPS   Oral  Take 200 mg by mouth 3 (three) times daily.          Marland Kitchen RABEPRAZOLE SODIUM 20 MG PO TBEC   Oral   Take 20 mg by mouth daily.           Marland Kitchen SPIRONOLACTONE 25 MG PO TABS   Oral   Take 12.5 mg by mouth daily.         . TORSEMIDE 20 MG PO TABS   Oral   Take 20 mg by mouth as needed. As needed         . ZOLPIDEM TARTRATE ER 12.5 MG PO TBCR   Oral   Take 12.5 mg by mouth.            There were no vitals taken for this  visit.  Physical Exam  Nursing note and vitals reviewed. Constitutional: She is oriented to person, place, and time. She appears well-developed and well-nourished. No distress.  HENT:  Mouth/Throat: Oropharynx is clear and moist.  Eyes: Conjunctivae normal are normal. No scleral icterus.  Neck: Neck supple. No tracheal deviation present.  Cardiovascular: Normal rate, regular rhythm, normal heart sounds and intact distal pulses.  Exam reveals no gallop and no friction rub.   No murmur heard. Pulmonary/Chest: Effort normal and breath sounds normal. No respiratory distress. She exhibits tenderness.  Abdominal: Soft. Normal appearance and bowel sounds are normal. She exhibits no distension and no mass. There is no tenderness. There is no rebound and no guarding.  Musculoskeletal: She exhibits no edema and no tenderness.  Neurological: She is alert and oriented to person, place, and time.  Skin: Skin is warm and dry. No rash noted.  Psychiatric: She has a normal mood and affect.    ED Course  Procedures (including critical care time)   Labs Reviewed  CBC WITH DIFFERENTIAL  COMPREHENSIVE METABOLIC PANEL    Results for orders placed during the hospital encounter of 05/30/12  CBC WITH DIFFERENTIAL      Component Value Range   WBC 10.9 (*) 4.0 - 10.5 K/uL   RBC 4.28  3.87 - 5.11 MIL/uL   Hemoglobin 12.2  12.0 - 15.0 g/dL   HCT 59.5  63.8 - 75.6 %   MCV 87.9  78.0 - 100.0 fL   MCH 28.5  26.0 - 34.0 pg   MCHC 32.4  30.0 - 36.0 g/dL   RDW 43.3  29.5 - 18.8 %   Platelets 243  150 - 400 K/uL   Neutrophils Relative 57  43 - 77 %   Neutro Abs 6.2  1.7 - 7.7 K/uL   Lymphocytes Relative 35  12 - 46 %   Lymphs Abs 3.8  0.7 - 4.0 K/uL   Monocytes Relative 7  3 - 12 %   Monocytes Absolute 0.8  0.1 - 1.0 K/uL   Eosinophils Relative 1  0 - 5 %   Eosinophils Absolute 0.1  0.0 - 0.7 K/uL   Basophils Relative 0  0 - 1 %   Basophils Absolute 0.0  0.0 - 0.1 K/uL  COMPREHENSIVE METABOLIC PANEL       Component Value Range   Sodium 131 (*) 135 - 145 mEq/L   Potassium 3.7  3.5 - 5.1 mEq/L   Chloride 92 (*) 96 - 112 mEq/L   CO2 24  19 - 32 mEq/L   Glucose, Bld 116 (*) 70 - 99 mg/dL   BUN 21  6 - 23 mg/dL   Creatinine, Ser 4.16 (*) 0.50 -  1.10 mg/dL   Calcium 9.5  8.4 - 16.1 mg/dL   Total Protein 8.2  6.0 - 8.3 g/dL   Albumin 4.4  3.5 - 5.2 g/dL   AST 26  0 - 37 U/L   ALT 36 (*) 0 - 35 U/L   Alkaline Phosphatase 89  39 - 117 U/L   Total Bilirubin 0.6  0.3 - 1.2 mg/dL   GFR calc non Af Amer 57 (*) >90 mL/min   GFR calc Af Amer 66 (*) >90 mL/min  POCT I-STAT TROPONIN I      Component Value Range   Troponin i, poc 0.00  0.00 - 0.08 ng/mL   Comment 3            Dg Chest 2 View  05/30/2012  *RADIOLOGY REPORT*  Clinical Data: Chest pain, shortness of breath  CHEST - 2 VIEW  Comparison: 11/04/2011  Findings: Lungs are clear. No pleural effusion or pneumothorax.  Stable cardiomegaly.  Left subclavian ICD.  Visualized osseous structures are within normal limits.  IMPRESSION: No evidence of acute cardiopulmonary disease.  Stable cardiomegaly.   Original Report Authenticated By: Charline Bills, M.D.       MDM  Labs. Ecg. Cxr.  Reviewed nursing notes and prior charts for additional history.   Reviewed cardiology notes, recent eval for cp felt to have non cardiac cp.  Pt w cardiac cath 2012 w normal coronaries.     Date: 05/30/2012  Rate: 79  Rhythm: normal sinus rhythm  QRS Axis: normal  Intervals: normal  ST/T Wave abnormalities: nonspecific ST/T changes  Conduction Disutrbances:none  Narrative Interpretation:   Old EKG Reviewed: unchanged No acute change compared to ecg 5/13  Vital signs c/w prior/charts reviewed.   Recheck pt comfortable. After atypical symptoms for past day, trop neg, prior cath neg. Discussed na and need close pcp follow up.     Suzi Roots, MD 05/30/12 [phone]

## 2012-05-30 NOTE — ED Notes (Signed)
Pt dc to home.  Pt states understanding to dc paperwork.   

## 2012-06-02 ENCOUNTER — Emergency Department (HOSPITAL_COMMUNITY): Payer: Medicare Other

## 2012-06-02 ENCOUNTER — Encounter (HOSPITAL_COMMUNITY): Payer: Self-pay | Admitting: *Deleted

## 2012-06-02 ENCOUNTER — Emergency Department (HOSPITAL_COMMUNITY)
Admission: EM | Admit: 2012-06-02 | Discharge: 2012-06-02 | Disposition: A | Discharge status: Home / Self Care | Payer: Medicare Other | Attending: Emergency Medicine | Admitting: Emergency Medicine

## 2012-06-02 DIAGNOSIS — R071 Chest pain on breathing: Secondary | ICD-10-CM | POA: Diagnosis not present

## 2012-06-02 DIAGNOSIS — S3981XA Other specified injuries of abdomen, initial encounter: Secondary | ICD-10-CM | POA: Diagnosis not present

## 2012-06-02 DIAGNOSIS — R0602 Shortness of breath: Secondary | ICD-10-CM | POA: Diagnosis not present

## 2012-06-02 DIAGNOSIS — Z8739 Personal history of other diseases of the musculoskeletal system and connective tissue: Secondary | ICD-10-CM | POA: Diagnosis not present

## 2012-06-02 DIAGNOSIS — R51 Headache: Secondary | ICD-10-CM | POA: Diagnosis not present

## 2012-06-02 DIAGNOSIS — Z853 Personal history of malignant neoplasm of breast: Secondary | ICD-10-CM | POA: Insufficient documentation

## 2012-06-02 DIAGNOSIS — Z79899 Other long term (current) drug therapy: Secondary | ICD-10-CM | POA: Insufficient documentation

## 2012-06-02 DIAGNOSIS — Z8711 Personal history of peptic ulcer disease: Secondary | ICD-10-CM | POA: Diagnosis not present

## 2012-06-02 DIAGNOSIS — R011 Cardiac murmur, unspecified: Secondary | ICD-10-CM | POA: Insufficient documentation

## 2012-06-02 DIAGNOSIS — F411 Generalized anxiety disorder: Secondary | ICD-10-CM | POA: Insufficient documentation

## 2012-06-02 DIAGNOSIS — Z8679 Personal history of other diseases of the circulatory system: Secondary | ICD-10-CM | POA: Diagnosis not present

## 2012-06-02 DIAGNOSIS — Z872 Personal history of diseases of the skin and subcutaneous tissue: Secondary | ICD-10-CM | POA: Diagnosis not present

## 2012-06-02 DIAGNOSIS — I509 Heart failure, unspecified: Secondary | ICD-10-CM | POA: Insufficient documentation

## 2012-06-02 DIAGNOSIS — Y9389 Activity, other specified: Secondary | ICD-10-CM | POA: Insufficient documentation

## 2012-06-02 DIAGNOSIS — Z8669 Personal history of other diseases of the nervous system and sense organs: Secondary | ICD-10-CM | POA: Diagnosis not present

## 2012-06-02 DIAGNOSIS — K219 Gastro-esophageal reflux disease without esophagitis: Secondary | ICD-10-CM | POA: Diagnosis not present

## 2012-06-02 DIAGNOSIS — S0993XA Unspecified injury of face, initial encounter: Secondary | ICD-10-CM | POA: Diagnosis not present

## 2012-06-02 DIAGNOSIS — S199XXA Unspecified injury of neck, initial encounter: Secondary | ICD-10-CM | POA: Diagnosis not present

## 2012-06-02 DIAGNOSIS — F329 Major depressive disorder, single episode, unspecified: Secondary | ICD-10-CM | POA: Insufficient documentation

## 2012-06-02 DIAGNOSIS — I10 Essential (primary) hypertension: Secondary | ICD-10-CM | POA: Diagnosis not present

## 2012-06-02 DIAGNOSIS — Z8659 Personal history of other mental and behavioral disorders: Secondary | ICD-10-CM | POA: Insufficient documentation

## 2012-06-02 DIAGNOSIS — R109 Unspecified abdominal pain: Secondary | ICD-10-CM | POA: Diagnosis not present

## 2012-06-02 DIAGNOSIS — Z95810 Presence of automatic (implantable) cardiac defibrillator: Secondary | ICD-10-CM | POA: Insufficient documentation

## 2012-06-02 DIAGNOSIS — Y92410 Unspecified street and highway as the place of occurrence of the external cause: Secondary | ICD-10-CM | POA: Insufficient documentation

## 2012-06-02 DIAGNOSIS — E669 Obesity, unspecified: Secondary | ICD-10-CM | POA: Diagnosis not present

## 2012-06-02 DIAGNOSIS — Z862 Personal history of diseases of the blood and blood-forming organs and certain disorders involving the immune mechanism: Secondary | ICD-10-CM | POA: Insufficient documentation

## 2012-06-02 DIAGNOSIS — F3289 Other specified depressive episodes: Secondary | ICD-10-CM | POA: Insufficient documentation

## 2012-06-02 DIAGNOSIS — S0990XA Unspecified injury of head, initial encounter: Secondary | ICD-10-CM | POA: Diagnosis not present

## 2012-06-02 DIAGNOSIS — Z8701 Personal history of pneumonia (recurrent): Secondary | ICD-10-CM | POA: Diagnosis not present

## 2012-06-02 DIAGNOSIS — S161XXA Strain of muscle, fascia and tendon at neck level, initial encounter: Secondary | ICD-10-CM

## 2012-06-02 DIAGNOSIS — E119 Type 2 diabetes mellitus without complications: Secondary | ICD-10-CM | POA: Insufficient documentation

## 2012-06-02 DIAGNOSIS — S298XXA Other specified injuries of thorax, initial encounter: Secondary | ICD-10-CM | POA: Diagnosis not present

## 2012-06-02 DIAGNOSIS — R0789 Other chest pain: Secondary | ICD-10-CM

## 2012-06-02 DIAGNOSIS — S139XXA Sprain of joints and ligaments of unspecified parts of neck, initial encounter: Secondary | ICD-10-CM | POA: Insufficient documentation

## 2012-06-02 HISTORY — DX: Dissociative and conversion disorder, unspecified: F44.9

## 2012-06-02 LAB — CBC WITH DIFFERENTIAL/PLATELET
Basophils Absolute: 0.1 10*3/uL (ref 0.0–0.1)
Basophils Relative: 1 % (ref 0–1)
Eosinophils Relative: 2 % (ref 0–5)
HCT: 39.4 % (ref 36.0–46.0)
MCHC: 32.2 g/dL (ref 30.0–36.0)
MCV: 88.5 fL (ref 78.0–100.0)
Monocytes Absolute: 0.5 10*3/uL (ref 0.1–1.0)
Platelets: 236 10*3/uL (ref 150–400)
RDW: 13.7 % (ref 11.5–15.5)

## 2012-06-02 LAB — COMPREHENSIVE METABOLIC PANEL
AST: 43 U/L — ABNORMAL HIGH (ref 0–37)
Albumin: 4.1 g/dL (ref 3.5–5.2)
Calcium: 9.4 mg/dL (ref 8.4–10.5)
Creatinine, Ser: 0.61 mg/dL (ref 0.50–1.10)
Total Protein: 8.1 g/dL (ref 6.0–8.3)

## 2012-06-02 MED ORDER — IOHEXOL 300 MG/ML  SOLN
100.0000 mL | Freq: Once | INTRAMUSCULAR | Status: AC | PRN
  Administered 2012-06-02: 100 mL via INTRAVENOUS

## 2012-06-02 MED ORDER — HYDROMORPHONE HCL PF 1 MG/ML IJ SOLN
1.0000 mg | Freq: Once | INTRAMUSCULAR | Status: AC
  Administered 2012-06-02: 1 mg via INTRAVENOUS
  Filled 2012-06-02: qty 1

## 2012-06-02 MED ORDER — OXYCODONE-ACETAMINOPHEN 5-325 MG PO TABS
2.0000 | ORAL_TABLET | Freq: Once | ORAL | Status: AC
  Administered 2012-06-02: 2 via ORAL
  Filled 2012-06-02: qty 2

## 2012-06-02 MED ORDER — ONDANSETRON HCL 4 MG/2ML IJ SOLN
4.0000 mg | Freq: Once | INTRAMUSCULAR | Status: AC
  Administered 2012-06-02: 4 mg via INTRAVENOUS
  Filled 2012-06-02: qty 2
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
## 2012-06-02 NOTE — ED Notes (Signed)
Abrasions to bilateral legs cleaned and bacitracin applied
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
## 2012-06-03 NOTE — ED Provider Notes (Signed)
Medical screening examination/treatment/procedure(s) were performed by non-physician practitioner and as supervising physician I was immediately available for consultation/collaboration.    Quadre Bristol L Breyana Follansbee, MD 06/03/12 1711 

## 2012-06-09 ENCOUNTER — Telehealth (HOSPITAL_COMMUNITY): Payer: Self-pay | Admitting: *Deleted

## 2012-06-09 NOTE — Telephone Encounter (Signed)
Spoke w/pt regarding echo results, she states her wt is up about 8-10 lbs from where it should be, she has increased her Torsemide to 20 mg Mon, Wed and Fri and that hasn't helped so she has been taking it daily and even took 30 mg yesterday, but it still has not helped.  She does report increased SOB, she is unable to send a transmission at this time b/c she has no land line currently.  Offered appt on Monday but she is not sure if she can come in or not, she will increase Torsemide to 40 mg daily today, tomorrow and Monday and we will contact her on Monday to see how she is doing and whether she needs appt or not.
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
## 2012-06-12 ENCOUNTER — Encounter (HOSPITAL_COMMUNITY): Payer: Self-pay

## 2012-06-12 ENCOUNTER — Ambulatory Visit (HOSPITAL_COMMUNITY)
Admission: RE | Admit: 2012-06-12 | Discharge: 2012-06-12 | Disposition: A | Discharge status: Home / Self Care | Payer: Medicare Other | Source: Ambulatory Visit | Attending: Internal Medicine | Admitting: Internal Medicine

## 2012-06-12 ENCOUNTER — Encounter: Payer: Self-pay | Admitting: Internal Medicine

## 2012-06-12 VITALS — BP 106/68 | HR 98 | Wt 255.0 lb

## 2012-06-12 DIAGNOSIS — Z95810 Presence of automatic (implantable) cardiac defibrillator: Secondary | ICD-10-CM | POA: Diagnosis not present

## 2012-06-12 DIAGNOSIS — I5022 Chronic systolic (congestive) heart failure: Secondary | ICD-10-CM | POA: Diagnosis not present

## 2012-06-12 NOTE — Assessment & Plan Note (Signed)
NYHA II. Volume status stable Continue current regimen. Instructed to only take Torsemide if her weight is 260 pounds or greater. Reinforced daily weights, limiting fluid intake to < 2 liters per day and low salt food choices. Follow up in 2 months with Dr Gala Romney.
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
## 2012-06-12 NOTE — Assessment & Plan Note (Signed)
No events noted. No threshold crossings noted. Activity < 2 hours per day.

## 2012-06-12 NOTE — Patient Instructions (Addendum)
Take Torsemide 20 mg only if your weight is 260 pounds or greater  Do the following things EVERYDAY: 1) Weigh yourself in the morning before breakfast. Write it down and keep it in a log. 2) Take your medicines as prescribed 3) Eat low salt foods-Limit salt (sodium) to 2000 mg per day.  4) Stay as active as you can everyday 5) Limit all fluids for the day to less than 2 liters  Follow up in 2 months with Dr Gala Romney

## 2012-06-19 ENCOUNTER — Telehealth (HOSPITAL_COMMUNITY): Payer: Self-pay | Admitting: Cardiology

## 2012-06-19 ENCOUNTER — Encounter: Payer: Medicare Other | Admitting: *Deleted

## 2012-06-19 NOTE — Telephone Encounter (Signed)
Checking the status of her jury duty letter.

## 2012-06-22 ENCOUNTER — Encounter: Payer: Self-pay | Admitting: *Deleted

## 2012-06-23 NOTE — Telephone Encounter (Signed)
Per Dr Gala Romney he feels pt is ok for jury duty

## 2012-06-28 DIAGNOSIS — I428 Other cardiomyopathies: Secondary | ICD-10-CM | POA: Diagnosis not present

## 2012-06-28 DIAGNOSIS — G4700 Insomnia, unspecified: Secondary | ICD-10-CM | POA: Diagnosis not present

## 2012-06-28 DIAGNOSIS — G4710 Hypersomnia, unspecified: Secondary | ICD-10-CM | POA: Diagnosis not present

## 2012-07-03 DIAGNOSIS — F411 Generalized anxiety disorder: Secondary | ICD-10-CM | POA: Diagnosis not present

## 2012-07-03 DIAGNOSIS — F329 Major depressive disorder, single episode, unspecified: Secondary | ICD-10-CM | POA: Diagnosis not present

## 2012-07-03 DIAGNOSIS — F341 Dysthymic disorder: Secondary | ICD-10-CM | POA: Diagnosis not present

## 2012-07-03 DIAGNOSIS — G541 Lumbosacral plexus disorders: Secondary | ICD-10-CM | POA: Diagnosis not present

## 2012-07-14 ENCOUNTER — Ambulatory Visit (INDEPENDENT_AMBULATORY_CARE_PROVIDER_SITE_OTHER): Payer: Medicare Other | Admitting: *Deleted

## 2012-07-14 DIAGNOSIS — I429 Cardiomyopathy, unspecified: Secondary | ICD-10-CM

## 2012-07-14 DIAGNOSIS — I5022 Chronic systolic (congestive) heart failure: Secondary | ICD-10-CM

## 2012-07-14 DIAGNOSIS — Z95810 Presence of automatic (implantable) cardiac defibrillator: Secondary | ICD-10-CM | POA: Diagnosis not present

## 2012-07-17 LAB — REMOTE ICD DEVICE
BATTERY VOLTAGE: 3.2088 V
BRDY-0002RV: 40 {beats}/min
DEV-0020ICD: NEGATIVE
PACEART VT: 0
TOT-0002: 0
TOT-0006: 20130522000000
TZAT-0004SLOWVT: 8
TZAT-0005SLOWVT: 88 pct
TZAT-0005SLOWVT: 91 pct
TZAT-0011SLOWVT: 10 ms
TZAT-0011SLOWVT: 10 ms
TZAT-0012FASTVT: 170 ms
TZAT-0019FASTVT: 8 V
TZAT-0020FASTVT: 1.5 ms
TZON-0004VSLOWVT: 36
TZST-0001FASTVT: 3
TZST-0001FASTVT: 5
TZST-0001FASTVT: 6
TZST-0001SLOWVT: 3
TZST-0001SLOWVT: 4
TZST-0001SLOWVT: 6
TZST-0002FASTVT: NEGATIVE
TZST-0002FASTVT: NEGATIVE
TZST-0003SLOWVT: 35 J
TZST-0003SLOWVT: 35 J
VENTRICULAR PACING ICD: 0 pct

## 2012-07-31 ENCOUNTER — Ambulatory Visit (HOSPITAL_COMMUNITY)
Admission: RE | Admit: 2012-07-31 | Discharge: 2012-07-31 | Disposition: A | Discharge status: Home / Self Care | Payer: Medicare Other | Source: Ambulatory Visit | Attending: Internal Medicine | Admitting: Internal Medicine

## 2012-07-31 ENCOUNTER — Telehealth (HOSPITAL_COMMUNITY): Payer: Self-pay | Admitting: *Deleted

## 2012-07-31 ENCOUNTER — Encounter: Payer: Self-pay | Admitting: Internal Medicine

## 2012-07-31 DIAGNOSIS — I5022 Chronic systolic (congestive) heart failure: Secondary | ICD-10-CM | POA: Diagnosis not present

## 2012-07-31 DIAGNOSIS — I5023 Acute on chronic systolic (congestive) heart failure: Secondary | ICD-10-CM

## 2012-07-31 MED ORDER — METOLAZONE 2.5 MG PO TABS: 2.5000 mg | ORAL_TABLET | ORAL | Status: DC

## 2012-07-31 NOTE — Progress Notes (Signed)
Heart Failure Diagnostics Follow Up Form - [employer]    Fluid Index        ___   Fluid index trending with baseline       ___   Fluid index trending up but below threshold       _X__   Fluid index above threshold       ___   More than 3 threshold crossings this year        Current diuretic dose: .  Torsemide 20 mg as need, pt has taken past 5 days                                                                                                Baseline weight: .                                      Current Weight: .                                     Patient Activity        Current activity level: .           1 hour per day                                               Baseline activity level: .         2- hours per day                                                 AT/AF Burden        ___  Increase in AT/AF burden       ___  New onset of AT/AF       _X__  No AT/AF         ICD Therapies        ___ 1 or more ICD shocks    Interventions       _X__  Adjust medications: Marland Kitchen  Metolazone 2.5 mg for 2 days, pt aware and rx sent to pharmacy per Tonye Becket, NP      ___  Labs: .  _X__  Need for follow up appointment: .  Keep as scheduled on 2/27, call back before if not feeling better                                                                              ___  HF diagnostics follow up: .                                                                                          ___  Review with EP: Marland Kitchen                                                                                                         Patient notified: Date: .     07/31/12                           By: .  Juliann Pares     Phone   .       Letter

## 2012-07-31 NOTE — Telephone Encounter (Signed)
Pt's mother called this AM to report pt has been more SOB for past week and it is getting worse, she states pt has been taking torsemide 20 mg daily for the past 5 days but it has not helped, asked pt to send in optivol transmission which she did and it did show elevated fluid, reviewed info with Tonye Becket, NP who recommends pt take metolazone 2.5 mg for 2 days and continue the torsemide 20 mg daily, pt is aware and agreeable, rx for metolazone sent into pharmacy, pt will keep f/u appt as sch on 2/27, she will let me know if not feeling better in next couple of days

## 2012-08-01 DIAGNOSIS — F341 Dysthymic disorder: Secondary | ICD-10-CM | POA: Diagnosis not present

## 2012-08-01 DIAGNOSIS — G541 Lumbosacral plexus disorders: Secondary | ICD-10-CM | POA: Diagnosis not present

## 2012-08-01 DIAGNOSIS — F329 Major depressive disorder, single episode, unspecified: Secondary | ICD-10-CM | POA: Diagnosis not present

## 2012-08-01 DIAGNOSIS — F411 Generalized anxiety disorder: Secondary | ICD-10-CM | POA: Diagnosis not present

## 2012-08-08 ENCOUNTER — Encounter: Payer: Self-pay | Admitting: *Deleted

## 2012-08-10 ENCOUNTER — Encounter: Payer: Self-pay | Admitting: Cardiovascular Disease

## 2012-08-10 ENCOUNTER — Ambulatory Visit (HOSPITAL_COMMUNITY)
Admission: RE | Admit: 2012-08-10 | Discharge: 2012-08-10 | Disposition: A | Discharge status: Home / Self Care | Payer: Medicare Other | Source: Ambulatory Visit | Attending: Internal Medicine | Admitting: Internal Medicine

## 2012-08-10 VITALS — BP 98/1 | HR 100 | Wt 242.8 lb

## 2012-08-10 DIAGNOSIS — I5022 Chronic systolic (congestive) heart failure: Secondary | ICD-10-CM

## 2012-08-10 DIAGNOSIS — I10 Essential (primary) hypertension: Secondary | ICD-10-CM | POA: Diagnosis not present

## 2012-08-10 DIAGNOSIS — Z6841 Body Mass Index (BMI) 40.0 and over, adult: Secondary | ICD-10-CM | POA: Insufficient documentation

## 2012-08-10 DIAGNOSIS — E66813 Obesity, class 3: Secondary | ICD-10-CM

## 2012-08-10 DIAGNOSIS — R0609 Other forms of dyspnea: Secondary | ICD-10-CM | POA: Diagnosis not present

## 2012-08-10 DIAGNOSIS — R0600 Dyspnea, unspecified: Secondary | ICD-10-CM

## 2012-08-10 MED ORDER — SPIRONOLACTONE 25 MG PO TABS: 25.0000 mg | ORAL_TABLET | Freq: Every day | ORAL | Status: DC

## 2012-08-10 NOTE — Assessment & Plan Note (Signed)
Well controlled, continue current therapy. 

## 2012-08-10 NOTE — Assessment & Plan Note (Signed)
Volume status well controlled after dose of metolazone last week.  Optivol tracing flat and activity level has increased to almost 3 hours a day.  She states she still has episodes of dyspnea that are associated with wheezing.  Will order PFTs to evaluate lung function.  Patient's prn demadex order was for weight 260 pounds or more but I feel her baseline is closer to 253-254 pounds.  She will take torsemide for weight 255 pounds or more.  Will also increase spiro 25 mg daily.  Follow up 1 week with BMET.

## 2012-08-10 NOTE — Patient Instructions (Addendum)
Increase spironolactone to 1 tab daily.    Take torsemide 20 mg for weight 255 pounds or more.    Follow up 3 months.    Labs next week at [facility]  Your physician has recommended that you have a pulmonary function test. Pulmonary Function Tests are a group of tests that measure how well air moves in and out of your lungs. Friday August 18, 2012 @10 :00 AM

## 2012-08-10 NOTE — Progress Notes (Addendum)
History of Present Illness: Primary Cardiologist:  Dr. Arvilla Meres Neurlogist: Dohlmeir   PCP: Dr. Maurice Small  [name] is a 42 y.o. female with a history of morbid obesity, depression, fibromyalgia, congestive heart failure secondary to nonischemic cardiomyopathy related to her chemotherapy for breast cancer. Last chem 2008. Still in remission.   CPX Feb 2010. Peak VO2 was 14.5 which was 71% of predicted. When corrected for body weight the VO2 was 20.7. The slope was 27. RER 1.20. O2 pulse 73%. Overall this was felt to be only a very mild functional limitation due to her obesity and mild circulatory  CPX 2/11: pVO2 13.0 (72%) correct for ideal wt 25ml/kg/min RER 1.06 (submax) slope 28.2 O2 pulse 91% - felt no signifcant cardiac limitation. + deconditioning.   Echo 04/2009  EF back down to 25%.  RHC normal. Echo 07/2009 showed EF 50%  Echo 06/2010 EF 40%. So we did MUGA EF 58% Echo 12/2010 40-45% 05/2011 EF 30%.  Grade 2 diastolic dysfunction Cath (12/28/10) was set up and demonstrated EF 20-25% and normal coronary arteries.  RA  4, RV 35/11, PA  28/8 (17), PCWP 8. Fick 5.2/2.4 PVR 1.7 Woods. Fick 5.2/2.4 Aortic saturation was 97%.  PA  saturation was 67% and 68%.   Cardiac MRI on 07/19/11: Moderate to severe LVE, Diffuse hypokinesis. EF 32%, Mild LAE  Evaluated by neuro for stuttering and evaluation felt to show no neuro abnormalities.  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  Returns for follow up today with her mother.  She if feeling pretty good.  She called 2 weeks ago with weight gain and dyspnea.  Metolazone 2.5 mg given for 2 days with improvement in symptoms.  Only taking torsemide as needed for symptoms or if weight is 260 pounds or more.  Weight today 253 pounds on scale at home.  Trying to follow low sodium diet and fluid restrictions.  Feels she has times of acute dyspnea when she is talking or increased activity, during this  time she also says she hears wheezing.  She states she does not have prior lung disease.  She does not feel that this is related to anxiety.    Optival today: Fluid index flat.  Daily activity almost 3 hrs recently.     Past Medical History  Diagnosis Date  . 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 Cardiac MRI 05/2011 EF 32%. ICD implantation 10/2011 ([employer])  . Peripheral neuropathy     chemo- induced  . Hypertension     c/b orthostatic hypotention  . Depression   . Palpitation     normal sinus rhythm only on 21 day heart monitor  . Obesity   . Fibromyalgia   . GERD (gastroesophageal reflux disease)   . Heart murmur   . Breast calcifications     right breast  . Interstitial cystitis   . Anxiety   . Nonischemic cardiomyopathy     related to chemo; EF 30% 05/2011  . ICD (implantable cardiac defibrillator) in place   . Pneumonia 2000's    "once"  . Type II diabetes mellitus   . Blood transfusion 1980's    1987 or 1988  . Anemia 1980's    [id number]  . History of stomach ulcers   . Breast cancer     right;  . Conversion disorder  Current Outpatient Prescriptions on File Prior to Encounter  Medication Sig Dispense Refill  . B Complex-C (B-COMPLEX WITH VITAMIN C) tablet Take 1 tablet by mouth daily.      . beta carotene w/minerals (OCUVITE) tablet Take 1 tablet by mouth daily.      . carvedilol (COREG) 25 MG tablet Take 1 tablet (25 mg total) by mouth 2 (two) times daily with a meal.  60 tablet  6  . Cholecalciferol (VITAMIN D3) 5000 UNITS CAPS Take 1 capsule by mouth daily.       . Coenzyme Q10 (CO Q-10) 100 MG CAPS Take 100 mg by mouth daily.      . Cyanocobalamin 2500 MCG SUBL Place 2,500 mcg under the tongue daily.      . digoxin (LANOXIN) 0.125 MG tablet Take 1 tablet (125 mcg total) by mouth daily.  30 tablet  6  . DULoxetine (CYMBALTA) 60 MG capsule Take 60 mg by  mouth daily.      . Flax OIL Take 10 mLs by mouth daily.      . Flaxseed, Linseed, (FLAX SEEDS PO) Take by mouth daily. Ground Flax Seeds-1 scoop daily (Scoop came with product)      . GLUCOSAMINE-CHONDROITIN PO Take 1,200 mg by mouth daily.      Marland Kitchen lisinopril (PRINIVIL,ZESTRIL) 10 MG tablet Take 1 tablet (10 mg total) by mouth 2 (two) times daily.  60 tablet  6  . LORazepam (ATIVAN) 1 MG tablet Take 1 mg by mouth 2 (two) times daily as needed. For anxiety      . methocarbamol (ROBAXIN) 750 MG tablet Take 750 mg by mouth 2 (two) times daily.       . metolazone (ZAROXOLYN) 2.5 MG tablet Take 1 tablet (2.5 mg total) by mouth as directed.  5 tablet  1  . Multiple Vitamin (MULTIVITAMIN WITH MINERALS) TABS Take 1 tablet by mouth daily.      Marland Kitchen oxyCODONE-acetaminophen (PERCOCET) 10-325 MG per tablet Take 1 tablet by mouth every 4 (four) hours as needed. For pain      . pentosan polysulfate (ELMIRON) 100 MG capsule Take 100 mg by mouth 2 (two) times daily.      . potassium chloride SA (K-DUR,KLOR-CON) 20 MEQ tablet Take 20 mEq by mouth daily. With fluid pill      . pregabalin (LYRICA) 200 MG capsule Take 600 mg by mouth at bedtime.       . RABEprazole (ACIPHEX) 20 MG tablet Take 20 mg by mouth daily.        Marland Kitchen spironolactone (ALDACTONE) 25 MG tablet Take 0.5 tablets (12.5 mg total) by mouth daily.  15 tablet  6  . torsemide (DEMADEX) 20 MG tablet Take 20 mg by mouth as needed.      . zolpidem (AMBIEN CR) 12.5 MG CR tablet Take 12.5 mg by mouth.        No current facility-administered medications on file prior to encounter.     Allergies  Allergen Reactions  . Other Rash    Chloraprep  . Reglan (Metoclopramide Hcl) Anxiety   ROS: All pertinent positives or negatives as in HPI otherwise negative   Vital Signs: Filed Vitals:   08/10/12 1100  BP: 98/1  Pulse: 100  Weight: 242 lb 12 oz (110.111 kg)  SpO2: 97%   PHYSICAL EXAM: Well nourished, well developed, in no acute distress, broken speech  Mom present HEENT: normal Neck: JVP flat Cardiac:  normal S1, S2; RRR; no murmur, No S3  Lungs:  clear to auscultation bilaterally, no wheezing, rhonchi or rales Abd: obese, soft, nontender, no hepatomegaly Ext: no edema.   Skin: warm and dry Neuro:  CNs 2-12 intact, no focal abnormalities noted Psych: Normal affect  ASSESSMENT AND PLAN:

## 2012-08-10 NOTE — Assessment & Plan Note (Signed)
Have discussed increasing activity level.  Would start with walking 10 min 2-3 times per day.  She voices understanding.

## 2012-08-17 ENCOUNTER — Other Ambulatory Visit: Payer: Medicare Other

## 2012-08-18 ENCOUNTER — Inpatient Hospital Stay (HOSPITAL_COMMUNITY): Admission: RE | Admit: 2012-08-18 | Payer: Medicare Other | Source: Ambulatory Visit

## 2012-08-25 ENCOUNTER — Ambulatory Visit (INDEPENDENT_AMBULATORY_CARE_PROVIDER_SITE_OTHER): Payer: Medicare Other | Admitting: *Deleted

## 2012-08-25 ENCOUNTER — Encounter: Payer: Self-pay | Admitting: Oncology

## 2012-08-25 DIAGNOSIS — R0989 Other specified symptoms and signs involving the circulatory and respiratory systems: Secondary | ICD-10-CM

## 2012-08-25 DIAGNOSIS — R0600 Dyspnea, unspecified: Secondary | ICD-10-CM

## 2012-08-25 DIAGNOSIS — R0609 Other forms of dyspnea: Secondary | ICD-10-CM | POA: Diagnosis not present

## 2012-08-25 LAB — BASIC METABOLIC PANEL
CO2: 26 mEq/L (ref 19–32)
Glucose, Bld: 88 mg/dL (ref 70–99)
Potassium: 4.3 mEq/L (ref 3.5–5.3)
Sodium: 137 mEq/L (ref 135–145)

## 2012-08-28 DIAGNOSIS — F411 Generalized anxiety disorder: Secondary | ICD-10-CM | POA: Diagnosis not present

## 2012-08-28 DIAGNOSIS — F341 Dysthymic disorder: Secondary | ICD-10-CM | POA: Diagnosis not present

## 2012-08-28 DIAGNOSIS — G541 Lumbosacral plexus disorders: Secondary | ICD-10-CM | POA: Diagnosis not present

## 2012-08-28 DIAGNOSIS — F329 Major depressive disorder, single episode, unspecified: Secondary | ICD-10-CM | POA: Diagnosis not present

## 2012-08-29 ENCOUNTER — Ambulatory Visit (HOSPITAL_COMMUNITY)
Admission: RE | Admit: 2012-08-29 | Discharge: 2012-08-29 | Disposition: A | Discharge status: Home / Self Care | Payer: Medicare Other | Source: Ambulatory Visit | Attending: Internal Medicine | Admitting: Internal Medicine

## 2012-08-29 DIAGNOSIS — R0609 Other forms of dyspnea: Secondary | ICD-10-CM | POA: Diagnosis not present

## 2012-08-29 DIAGNOSIS — R0989 Other specified symptoms and signs involving the circulatory and respiratory systems: Secondary | ICD-10-CM | POA: Insufficient documentation

## 2012-08-29 LAB — PULMONARY FUNCTION TEST

## 2012-08-29 MED ORDER — ALBUTEROL SULFATE (5 MG/ML) 0.5% IN NEBU
2.5000 mg | INHALATION_SOLUTION | Freq: Once | RESPIRATORY_TRACT | Status: AC
  Administered 2012-08-29: 2.5 mg via RESPIRATORY_TRACT

## 2012-08-30 ENCOUNTER — Other Ambulatory Visit: Payer: Self-pay | Admitting: Internal Medicine

## 2012-09-01 DIAGNOSIS — M542 Cervicalgia: Secondary | ICD-10-CM | POA: Diagnosis not present

## 2012-09-01 DIAGNOSIS — Z79899 Other long term (current) drug therapy: Secondary | ICD-10-CM | POA: Diagnosis not present

## 2012-09-06 ENCOUNTER — Encounter: Payer: Self-pay | Admitting: Internal Medicine

## 2012-09-13 ENCOUNTER — Ambulatory Visit: Payer: Medicare Other | Admitting: Gynecologic Oncology

## 2012-09-13 ENCOUNTER — Encounter: Payer: Self-pay | Admitting: *Deleted

## 2012-09-13 NOTE — Progress Notes (Signed)
Pt was not able to be seen today due to being arrived late and I rescheduled and confirmed 09/19/12 appt w/ pt.  Gave her a calendar.

## 2012-09-19 ENCOUNTER — Telehealth (HOSPITAL_COMMUNITY): Payer: Self-pay | Admitting: *Deleted

## 2012-09-19 ENCOUNTER — Ambulatory Visit: Payer: Medicare Other | Admitting: Gynecologic Oncology

## 2012-09-19 ENCOUNTER — Telehealth: Payer: Self-pay | Admitting: *Deleted

## 2012-09-19 DIAGNOSIS — R0600 Dyspnea, unspecified: Secondary | ICD-10-CM

## 2012-09-19 NOTE — Telephone Encounter (Signed)
Pt called to cancel her appt. She will call back to r/s at a later time.

## 2012-09-19 NOTE — Telephone Encounter (Signed)
Message copied by Noralee Space on Tue Sep 19, 2012  4:54 PM ------      Message from: [place], [place] D      Created: Tue Sep 12, 2012 12:59 PM      Regarding: Refer to pulmonary       Please refer to pulmonary. PFTs results reviewed by Dr Gala Romney and he called her.  Asthma.             Thanks Amy ------

## 2012-09-26 DIAGNOSIS — F341 Dysthymic disorder: Secondary | ICD-10-CM | POA: Diagnosis not present

## 2012-09-26 DIAGNOSIS — F411 Generalized anxiety disorder: Secondary | ICD-10-CM | POA: Diagnosis not present

## 2012-09-26 DIAGNOSIS — F329 Major depressive disorder, single episode, unspecified: Secondary | ICD-10-CM | POA: Diagnosis not present

## 2012-09-26 DIAGNOSIS — G541 Lumbosacral plexus disorders: Secondary | ICD-10-CM | POA: Diagnosis not present

## 2012-10-09 ENCOUNTER — Institutional Professional Consult (permissible substitution): Payer: Medicare Other | Admitting: Emergency Medicine

## 2012-10-16 ENCOUNTER — Encounter: Payer: Self-pay | Admitting: Internal Medicine

## 2012-10-16 ENCOUNTER — Ambulatory Visit (INDEPENDENT_AMBULATORY_CARE_PROVIDER_SITE_OTHER): Payer: Medicare Other | Admitting: *Deleted

## 2012-10-16 ENCOUNTER — Encounter (HOSPITAL_COMMUNITY): Payer: Self-pay

## 2012-10-16 ENCOUNTER — Ambulatory Visit (HOSPITAL_COMMUNITY)
Admission: RE | Admit: 2012-10-16 | Discharge: 2012-10-16 | Disposition: A | Discharge status: Home / Self Care | Payer: Medicare Other | Source: Ambulatory Visit | Attending: Internal Medicine | Admitting: Internal Medicine

## 2012-10-16 ENCOUNTER — Other Ambulatory Visit: Payer: Self-pay

## 2012-10-16 VITALS — BP 98/76 | HR 94 | Resp 19 | Wt 251.4 lb

## 2012-10-16 DIAGNOSIS — I5022 Chronic systolic (congestive) heart failure: Secondary | ICD-10-CM

## 2012-10-16 DIAGNOSIS — I429 Cardiomyopathy, unspecified: Secondary | ICD-10-CM

## 2012-10-16 DIAGNOSIS — I509 Heart failure, unspecified: Secondary | ICD-10-CM | POA: Insufficient documentation

## 2012-10-16 DIAGNOSIS — Z95810 Presence of automatic (implantable) cardiac defibrillator: Secondary | ICD-10-CM | POA: Diagnosis not present

## 2012-10-16 DIAGNOSIS — D649 Anemia, unspecified: Secondary | ICD-10-CM

## 2012-10-16 LAB — BASIC METABOLIC PANEL
CO2: 29 mEq/L (ref 19–32)
Calcium: 8.7 mg/dL (ref 8.4–10.5)
Creatinine, Ser: 1.05 mg/dL (ref 0.50–1.10)
Glucose, Bld: 92 mg/dL (ref 70–99)

## 2012-10-16 LAB — CBC
MCH: 26.9 pg (ref 26.0–34.0)
MCV: 83.7 fL (ref 78.0–100.0)
Platelets: 242 10*3/uL (ref 150–400)
RDW: 14.8 % (ref 11.5–15.5)

## 2012-10-16 LAB — IRON AND TIBC: UIBC: 490 ug/dL — ABNORMAL HIGH (ref 125–400)

## 2012-10-16 NOTE — Patient Instructions (Addendum)
Take metolazone 2.5 mg and extra potassium tablet for 2 days  Take torsemide and potassium once daily  Labs next week at [facility]  Transmit tracing next week.  Follow up 3 weeks.

## 2012-10-17 NOTE — Addendum Note (Signed)
Encounter addended by: Dolores Patty, MD on: 10/17/2012  9:45 PM<BR>     Documentation filed: Charges VN

## 2012-10-17 NOTE — Assessment & Plan Note (Signed)
Her volume status is up in setting of what sounds like a PICA syndrome. Will ger her a metolazone today and have her resume demadex and potassium on a daily basis. Will send remote Optivol tracing next week to see if improving. Check labs to evaluate for iron deficiency anemia as cause of PICA. We have asked her to f/u with PCP. Reinforced need for dietary restriction, daily weights and reviewed use of sliding scale diuretics.

## 2012-10-17 NOTE — Progress Notes (Signed)
History of Present Illness: Primary Cardiologist:  Dr. Arvilla Meres Neurlogist: Dohlmeir   PCP: Dr. Rodney Langton, [facility]  [name] is a 42 y.o. female with a history of morbid obesity, depression, fibromyalgia, congestive heart failure secondary to nonischemic cardiomyopathy related to her chemotherapy for breast cancer. Last chem 2008. Still in remission.   CPX Feb 2010. Peak VO2 was 14.5 which was 71% of predicted. When corrected for body weight the VO2 was 20.7. The slope was 27. RER 1.20. O2 pulse 73%. Overall this was felt to be only a very mild functional limitation due to her obesity and mild circulatory  CPX 2/11: pVO2 13.0 (72%) correct for ideal wt 61ml/kg/min RER 1.06 (submax) slope 28.2 O2 pulse 91% - felt no signifcant cardiac limitation. + deconditioning.   Echo 04/2009  EF back down to 25%.  RHC normal. Echo 07/2009 showed EF 50%  Echo 06/2010 EF 40%. So we did MUGA EF 58% Echo 12/2010 40-45% 05/2011 EF 30%.  Grade 2 diastolic dysfunction Cath (12/28/10) was set up and demonstrated EF 20-25% and normal coronary arteries.  RA  4, RV 35/11, PA  28/8 (17), PCWP 8. Fick 5.2/2.4 PVR 1.7 Woods. Fick 5.2/2.4 Aortic saturation was 97%.  PA  saturation was 67% and 68%.   Cardiac MRI on 07/19/11: Moderate to severe LVE, Diffuse hypokinesis. EF 32%, Mild LAE  Evaluated by neuro for stuttering and evaluation felt to show no neuro abnormalities.  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  She returns for follow up today with her mother. She is up about 10 pounds from her last visit. Says she is compulsively craving and eaating [place] starch and 5-10 pounds of crushed ice a day. Said she had this before when her blood count was low. Continues with chronic exertional dyspnea with mild activity. Says she is only taking demadex 2x/week due to previous problems with hypotension. Weight at home is 251 pounds, if weight goes up 4-7 pounds then  takes fluid pill. + edema. Denies melena or toher evidence of bleeding. No heavy menses.  Optivol today reviewed personally: Fluid index markedly elevated.  Daily activity ~2 hrs.     Past Medical History  Diagnosis Date  . 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 Cardiac MRI 05/2011 EF 32%. ICD implantation 10/2011 ([employer])  . Peripheral neuropathy     chemo- induced  . Hypertension     c/b orthostatic hypotention  . Depression   . Palpitation     normal sinus rhythm only on 21 day heart monitor  . Obesity   . Fibromyalgia   . GERD (gastroesophageal reflux disease)   . Heart murmur   . Breast calcifications     right breast  . Interstitial cystitis   . Anxiety   . Nonischemic cardiomyopathy     related to chemo; EF 30% 05/2011  . ICD (implantable cardiac defibrillator) in place   . Pneumonia 2000's    "once"  . Type II diabetes mellitus   . Blood transfusion 1980's    1987 or 1988  . Anemia 1980's    [id number]  . History of stomach ulcers   . Breast cancer     right;  . Conversion disorder    Current Outpatient Prescriptions on File Prior to Encounter  Medication Sig Dispense Refill  . B Complex-C (B-COMPLEX WITH VITAMIN  C) tablet Take 1 tablet by mouth daily.      . beta carotene w/minerals (OCUVITE) tablet Take 1 tablet by mouth daily.      . carvedilol (COREG) 25 MG tablet Take 1 tablet (25 mg total) by mouth 2 (two) times daily with a meal.  60 tablet  6  . Cholecalciferol (VITAMIN D3) 5000 UNITS CAPS Take 1 capsule by mouth daily.       . Coenzyme Q10 (CO Q-10) 100 MG CAPS Take 100 mg by mouth daily.      . Cyanocobalamin 2500 MCG SUBL Place 2,500 mcg under the tongue daily.      . digoxin (LANOXIN) 0.125 MG tablet Take 1 tablet (125 mcg total) by mouth daily.  30 tablet  6  . DULoxetine (CYMBALTA) 60 MG capsule Take 60 mg by mouth daily.      . Flax OIL Take  10 mLs by mouth daily.      . Flaxseed, Linseed, (FLAX SEEDS PO) Take by mouth daily. Ground Flax Seeds-1 scoop daily (Scoop came with product)      . GLUCOSAMINE-CHONDROITIN PO Take 1,200 mg by mouth daily.      Marland Kitchen lisinopril (PRINIVIL,ZESTRIL) 10 MG tablet Take 1 tablet (10 mg total) by mouth 2 (two) times daily.  60 tablet  6  . LORazepam (ATIVAN) 1 MG tablet Take 1 mg by mouth 2 (two) times daily as needed. For anxiety      . methocarbamol (ROBAXIN) 750 MG tablet Take 750 mg by mouth 2 (two) times daily.       . metolazone (ZAROXOLYN) 2.5 MG tablet Take 1 tablet (2.5 mg total) by mouth as directed.  5 tablet  1  . Multiple Vitamin (MULTIVITAMIN WITH MINERALS) TABS Take 1 tablet by mouth daily.      Marland Kitchen oxyCODONE-acetaminophen (PERCOCET) 10-325 MG per tablet Take 1 tablet by mouth every 4 (four) hours as needed. For pain      . pentosan polysulfate (ELMIRON) 100 MG capsule Take 100 mg by mouth 2 (two) times daily.      . potassium chloride SA (K-DUR,KLOR-CON) 20 MEQ tablet Take 20 mEq by mouth daily. With fluid pill      . pregabalin (LYRICA) 200 MG capsule Take 600 mg by mouth at bedtime.       . RABEprazole (ACIPHEX) 20 MG tablet Take 20 mg by mouth daily.        Marland Kitchen spironolactone (ALDACTONE) 25 MG tablet Take 1 tablet (25 mg total) by mouth daily.  30 tablet  6  . torsemide (DEMADEX) 20 MG tablet FOR FLUID RETENTION TO BE TAKEN WITH POTASSIUM  60 tablet  3  . zolpidem (AMBIEN CR) 12.5 MG CR tablet Take 12.5 mg by mouth.        No current facility-administered medications on file prior to encounter.     Allergies  Allergen Reactions  . Other Rash    Chloraprep  . Reglan (Metoclopramide Hcl) Anxiety   ROS: All pertinent positives or negatives as in HPI otherwise negative   Vital Signs: Filed Vitals:   10/16/12 1153  BP: 98/76  Pulse: 94  Resp: 19  Weight: 251 lb 6.4 oz (114.034 kg)  SpO2: 97%   PHYSICAL EXAM: Well nourished, well developed, in no acute distress HEENT:  normal Neck: JVP unable to see due to sixze Cardiac:  normal S1, S2; RRR; no murmur, No S3 Lungs:  clear to auscultation bilaterally, no wheezing, rhonchi or rales Abd: obese, soft,  nontender, +distension Ext: 1-2+ edema.   Skin: warm and dry Neuro:  CNs 2-12 intact, no focal abnormalities noted Psych: Normal affect  ASSESSMENT AND PLAN:

## 2012-10-20 ENCOUNTER — Institutional Professional Consult (permissible substitution): Payer: Medicare Other | Admitting: Emergency Medicine

## 2012-10-22 LAB — REMOTE ICD DEVICE
CHARGE TIME: 8.758 s
DEV-0020ICD: NEGATIVE
PACEART VT: 0
RV LEAD AMPLITUDE: 15.3 mv
TOT-0001: 0
TZAT-0004SLOWVT: 8
TZAT-0004SLOWVT: 8
TZAT-0011SLOWVT: 10 ms
TZAT-0011SLOWVT: 10 ms
TZAT-0012FASTVT: 170 ms
TZAT-0012SLOWVT: 170 ms
TZAT-0012SLOWVT: 170 ms
TZAT-0018FASTVT: NEGATIVE
TZAT-0019FASTVT: 8 V
TZAT-0020FASTVT: 1.5 ms
TZAT-0020SLOWVT: 1.5 ms
TZON-0003SLOWVT: 300 ms
TZON-0003VSLOWVT: 400 ms
TZON-0004VSLOWVT: 36
TZST-0001FASTVT: 2
TZST-0001FASTVT: 6
TZST-0001SLOWVT: 5
TZST-0002FASTVT: NEGATIVE
TZST-0002FASTVT: NEGATIVE
TZST-0003SLOWVT: 35 J
TZST-0003SLOWVT: 35 J
TZST-0003SLOWVT: 35 J
VENTRICULAR PACING ICD: 0 pct
VF: 0

## 2012-10-26 DIAGNOSIS — E119 Type 2 diabetes mellitus without complications: Secondary | ICD-10-CM | POA: Diagnosis not present

## 2012-10-26 DIAGNOSIS — C50919 Malignant neoplasm of unspecified site of unspecified female breast: Secondary | ICD-10-CM | POA: Diagnosis not present

## 2012-10-26 DIAGNOSIS — J45909 Unspecified asthma, uncomplicated: Secondary | ICD-10-CM | POA: Diagnosis not present

## 2012-10-26 DIAGNOSIS — R5381 Other malaise: Secondary | ICD-10-CM | POA: Diagnosis not present

## 2012-10-26 DIAGNOSIS — K219 Gastro-esophageal reflux disease without esophagitis: Secondary | ICD-10-CM | POA: Diagnosis not present

## 2012-10-26 DIAGNOSIS — R5383 Other fatigue: Secondary | ICD-10-CM | POA: Diagnosis not present

## 2012-10-26 DIAGNOSIS — Z23 Encounter for immunization: Secondary | ICD-10-CM | POA: Diagnosis not present

## 2012-10-26 DIAGNOSIS — F329 Major depressive disorder, single episode, unspecified: Secondary | ICD-10-CM | POA: Diagnosis not present

## 2012-10-26 DIAGNOSIS — I509 Heart failure, unspecified: Secondary | ICD-10-CM | POA: Diagnosis not present

## 2012-10-26 DIAGNOSIS — IMO0001 Reserved for inherently not codable concepts without codable children: Secondary | ICD-10-CM | POA: Diagnosis not present

## 2012-10-30 DIAGNOSIS — F329 Major depressive disorder, single episode, unspecified: Secondary | ICD-10-CM | POA: Diagnosis not present

## 2012-10-30 DIAGNOSIS — H81399 Other peripheral vertigo, unspecified ear: Secondary | ICD-10-CM | POA: Diagnosis not present

## 2012-10-30 DIAGNOSIS — R42 Dizziness and giddiness: Secondary | ICD-10-CM | POA: Diagnosis not present

## 2012-10-30 DIAGNOSIS — F341 Dysthymic disorder: Secondary | ICD-10-CM | POA: Diagnosis not present

## 2012-10-30 DIAGNOSIS — H8190 Unspecified disorder of vestibular function, unspecified ear: Secondary | ICD-10-CM | POA: Diagnosis not present

## 2012-10-30 DIAGNOSIS — G541 Lumbosacral plexus disorders: Secondary | ICD-10-CM | POA: Diagnosis not present

## 2012-11-07 ENCOUNTER — Encounter (HOSPITAL_COMMUNITY): Payer: Self-pay | Admitting: *Deleted

## 2012-11-07 ENCOUNTER — Encounter (HOSPITAL_COMMUNITY): Payer: Medicare Other

## 2012-11-15 ENCOUNTER — Ambulatory Visit (INDEPENDENT_AMBULATORY_CARE_PROVIDER_SITE_OTHER)
Admission: RE | Admit: 2012-11-15 | Discharge: 2012-11-15 | Disposition: A | Discharge status: Home / Self Care | Payer: Medicare Other | Source: Ambulatory Visit | Attending: Pulmonary Disease | Admitting: Pulmonary Disease

## 2012-11-15 ENCOUNTER — Encounter: Payer: Self-pay | Admitting: *Deleted

## 2012-11-15 ENCOUNTER — Ambulatory Visit (INDEPENDENT_AMBULATORY_CARE_PROVIDER_SITE_OTHER): Payer: Medicare Other | Admitting: Pulmonary Disease

## 2012-11-15 ENCOUNTER — Encounter: Payer: Self-pay | Admitting: Pulmonary Disease

## 2012-11-15 VITALS — BP 110/72 | HR 84 | Temp 98.4°F | Ht 67.0 in | Wt 243.0 lb

## 2012-11-15 DIAGNOSIS — R0989 Other specified symptoms and signs involving the circulatory and respiratory systems: Secondary | ICD-10-CM

## 2012-11-15 DIAGNOSIS — R0602 Shortness of breath: Secondary | ICD-10-CM

## 2012-11-15 DIAGNOSIS — R0600 Dyspnea, unspecified: Secondary | ICD-10-CM

## 2012-11-15 DIAGNOSIS — J45998 Other asthma: Secondary | ICD-10-CM | POA: Insufficient documentation

## 2012-11-15 DIAGNOSIS — R0609 Other forms of dyspnea: Secondary | ICD-10-CM | POA: Diagnosis not present

## 2012-11-15 DIAGNOSIS — J984 Other disorders of lung: Secondary | ICD-10-CM | POA: Diagnosis not present

## 2012-11-15 DIAGNOSIS — J449 Chronic obstructive pulmonary disease, unspecified: Secondary | ICD-10-CM

## 2012-11-15 DIAGNOSIS — J4489 Other specified chronic obstructive pulmonary disease: Secondary | ICD-10-CM

## 2012-11-15 HISTORY — DX: Other specified chronic obstructive pulmonary disease: J44.89

## 2012-11-15 HISTORY — DX: Chronic obstructive pulmonary disease, unspecified: J44.9

## 2012-11-15 MED ORDER — BECLOMETHASONE DIPROPIONATE 80 MCG/ACT IN AERS: 2.0000 | INHALATION_SPRAY | Freq: Two times a day (BID) | RESPIRATORY_TRACT | Status: DC

## 2012-11-15 MED ORDER — ALBUTEROL SULFATE HFA 108 (90 BASE) MCG/ACT IN AERS: 2.0000 | INHALATION_SPRAY | Freq: Four times a day (QID) | RESPIRATORY_TRACT | Status: DC | PRN

## 2012-11-15 NOTE — Progress Notes (Deleted)
  Subjective:    Patient ID: [name], female    DOB: 03/23/1971, 42 y.o.   MRN: [id number]  HPI    Review of Systems  Constitutional: Negative for fever and unexpected weight change.  HENT: Positive for congestion, rhinorrhea and sinus pressure. Negative for ear pain, nosebleeds, sore throat, sneezing, trouble swallowing, dental problem and postnasal drip.   Eyes: Negative for redness and itching.  Respiratory: Positive for cough, chest tightness, shortness of breath and wheezing.   Cardiovascular: Positive for chest pain. Negative for palpitations and leg swelling.  Gastrointestinal: Negative for nausea and vomiting.  Genitourinary: Negative for dysuria.  Musculoskeletal: Negative for joint swelling.  Skin: Negative for rash.  Neurological: Negative for headaches.  Hematological: Does not bruise/bleed easily.  Psychiatric/Behavioral: Negative for dysphoric mood. The patient is not nervous/anxious.        Objective:   Physical Exam        Assessment & Plan:

## 2012-11-15 NOTE — Patient Instructions (Signed)
Qvar two puffs twice per day, and rinse mouth after each use Proair two puffs up to four times per day as needed for cough, wheeze, or chest congestion Chest xray today Will arrange for overnight oxygen test Follow up in 4 weeks

## 2012-11-15 NOTE — Assessment & Plan Note (Addendum)
Likely multifactorial.  She does have systolic/diastolic heart failure, and this is being managed by cardiology.  Concern for more recent symptoms is related to obstructive lung disease in the setting of allergies and possible asthma.  She likely also has a component of deconditioning.  To further assess will repeat chest xray, and arrange for overnight oximetry.  Will also give trial of inhalers.

## 2012-11-15 NOTE — Assessment & Plan Note (Signed)
She has mild restriction with decreased residual volume on recent PFT.  Diffusion was normal.  Will repeat chest xray.  Most likely related to obesity.

## 2012-11-15 NOTE — Assessment & Plan Note (Signed)
She has history of allergies.  Her symptoms are consistent with asthma/obstructrive lung disease.  Her recent PFT showed evidence for obstruction.  Will give her trial of Qvar and prn albuterol.  She may also require further allergy assessment, depending on her response to therapy.
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
## 2012-11-16 ENCOUNTER — Telehealth: Payer: Self-pay | Admitting: Pulmonary Disease

## 2012-11-16 NOTE — Telephone Encounter (Signed)
Dg Chest 2 View  11/15/2012   *RADIOLOGY REPORT*  Clinical Data: Cough, shortness of breath, chest pain  CHEST - 2 VIEW  Comparison: 06/02/2012  Findings: Left-sided single lead AICD in place.  Moderate enlargement of the cardiomediastinal silhouette noted with central vascular congestion.  Right axillary clips are noted.  No pleural effusions.  No focal pulmonary opacity.  No visualized bony abnormality.  IMPRESSION: Cardiomegaly with central vascular congestion but no overt edema.   Original Report Authenticated By: Christiana Pellant, M.D.    Will have my nurse inform pt that CXR shows expected changes from known history of congestive heart failure.  No other worrisome findings.  No change to treatment plan as detailed from consult visit on 11/15/12.

## 2012-11-16 NOTE — Telephone Encounter (Signed)
LMTCB

## 2012-11-17 ENCOUNTER — Other Ambulatory Visit (HOSPITAL_COMMUNITY): Payer: Self-pay | Admitting: *Deleted

## 2012-11-17 MED ORDER — CARVEDILOL 25 MG PO TABS: 25.0000 mg | ORAL_TABLET | Freq: Two times a day (BID) | ORAL | Status: DC

## 2012-11-17 MED ORDER — LISINOPRIL 10 MG PO TABS: 10.0000 mg | ORAL_TABLET | Freq: Two times a day (BID) | ORAL | Status: DC

## 2012-11-17 MED ORDER — DIGOXIN 125 MCG PO TABS: 125.0000 ug | ORAL_TABLET | Freq: Every day | ORAL | Status: DC

## 2012-11-18 DIAGNOSIS — Z79899 Other long term (current) drug therapy: Secondary | ICD-10-CM | POA: Diagnosis not present

## 2012-11-18 DIAGNOSIS — F341 Dysthymic disorder: Secondary | ICD-10-CM | POA: Diagnosis not present

## 2012-11-18 DIAGNOSIS — F411 Generalized anxiety disorder: Secondary | ICD-10-CM | POA: Diagnosis not present

## 2012-11-18 DIAGNOSIS — M545 Low back pain: Secondary | ICD-10-CM | POA: Diagnosis not present

## 2012-11-18 DIAGNOSIS — G541 Lumbosacral plexus disorders: Secondary | ICD-10-CM | POA: Diagnosis not present

## 2012-11-18 DIAGNOSIS — M542 Cervicalgia: Secondary | ICD-10-CM | POA: Diagnosis not present

## 2012-11-18 DIAGNOSIS — F329 Major depressive disorder, single episode, unspecified: Secondary | ICD-10-CM | POA: Diagnosis not present

## 2012-11-20 ENCOUNTER — Encounter: Payer: Medicare Other | Admitting: *Deleted

## 2012-11-20 ENCOUNTER — Ambulatory Visit (INDEPENDENT_AMBULATORY_CARE_PROVIDER_SITE_OTHER): Payer: Medicare Other | Admitting: *Deleted

## 2012-11-20 DIAGNOSIS — M545 Low back pain: Secondary | ICD-10-CM | POA: Diagnosis not present

## 2012-11-20 DIAGNOSIS — Z95810 Presence of automatic (implantable) cardiac defibrillator: Secondary | ICD-10-CM

## 2012-11-20 DIAGNOSIS — M542 Cervicalgia: Secondary | ICD-10-CM | POA: Diagnosis not present

## 2012-11-20 DIAGNOSIS — Z79899 Other long term (current) drug therapy: Secondary | ICD-10-CM | POA: Diagnosis not present

## 2012-11-20 DIAGNOSIS — I5022 Chronic systolic (congestive) heart failure: Secondary | ICD-10-CM | POA: Diagnosis not present

## 2012-11-20 NOTE — Telephone Encounter (Signed)
LMTCB

## 2012-11-21 ENCOUNTER — Encounter: Payer: Self-pay | Admitting: *Deleted

## 2012-11-21 NOTE — Telephone Encounter (Signed)
Since we have not heard back from this pt in regards to her results, I will be mailing her a letter with her results. Nothing further is needed.

## 2012-11-29 ENCOUNTER — Encounter: Payer: Self-pay | Admitting: Internal Medicine

## 2012-11-29 LAB — REMOTE ICD DEVICE
BATTERY VOLTAGE: 3.2088 V
BRDY-0002RV: 40 {beats}/min
DEV-0020ICD: NEGATIVE
FVT: 0
HV IMPEDENCE: 69 Ohm
PACEART VT: 0
TOT-0006: 20130522000000
TZAT-0001SLOWVT: 2
TZAT-0004SLOWVT: 8
TZAT-0012FASTVT: 170 ms
TZAT-0018FASTVT: NEGATIVE
TZAT-0019SLOWVT: 8 V
TZAT-0019SLOWVT: 8 V
TZAT-0020FASTVT: 1.5 ms
TZAT-0020SLOWVT: 1.5 ms
TZAT-0020SLOWVT: 1.5 ms
TZON-0003VSLOWVT: 400 ms
TZST-0001FASTVT: 4
TZST-0001FASTVT: 5
TZST-0001FASTVT: 6
TZST-0001SLOWVT: 3
TZST-0001SLOWVT: 5
TZST-0002FASTVT: NEGATIVE
TZST-0002FASTVT: NEGATIVE
TZST-0003SLOWVT: 35 J
TZST-0003SLOWVT: 35 J
TZST-0003SLOWVT: 35 J
VENTRICULAR PACING ICD: 0 pct
VF: 0

## 2012-12-12 ENCOUNTER — Encounter: Payer: Self-pay | Admitting: *Deleted

## 2012-12-20 ENCOUNTER — Ambulatory Visit: Payer: Medicare Other | Admitting: Pulmonary Disease

## 2012-12-25 ENCOUNTER — Encounter: Payer: Self-pay | Admitting: Pulmonary Disease

## 2012-12-25 ENCOUNTER — Encounter: Payer: Self-pay | Admitting: Emergency Medicine

## 2012-12-29 DIAGNOSIS — R0989 Other specified symptoms and signs involving the circulatory and respiratory systems: Secondary | ICD-10-CM | POA: Diagnosis not present

## 2012-12-29 DIAGNOSIS — R0609 Other forms of dyspnea: Secondary | ICD-10-CM | POA: Diagnosis not present

## 2013-01-01 DIAGNOSIS — M542 Cervicalgia: Secondary | ICD-10-CM | POA: Diagnosis not present

## 2013-01-01 DIAGNOSIS — M546 Pain in thoracic spine: Secondary | ICD-10-CM | POA: Diagnosis not present

## 2013-01-01 DIAGNOSIS — F329 Major depressive disorder, single episode, unspecified: Secondary | ICD-10-CM | POA: Diagnosis not present

## 2013-01-01 DIAGNOSIS — F411 Generalized anxiety disorder: Secondary | ICD-10-CM | POA: Diagnosis not present

## 2013-01-01 DIAGNOSIS — M545 Low back pain: Secondary | ICD-10-CM | POA: Diagnosis not present

## 2013-01-01 DIAGNOSIS — F341 Dysthymic disorder: Secondary | ICD-10-CM | POA: Diagnosis not present

## 2013-01-01 DIAGNOSIS — Z79899 Other long term (current) drug therapy: Secondary | ICD-10-CM | POA: Diagnosis not present

## 2013-01-01 DIAGNOSIS — G541 Lumbosacral plexus disorders: Secondary | ICD-10-CM | POA: Diagnosis not present

## 2013-01-02 ENCOUNTER — Telehealth: Payer: Self-pay | Admitting: Pulmonary Disease

## 2013-01-02 NOTE — Telephone Encounter (Signed)
ONO with RA 12/29/12 >> Test time 7 hrs 42 min.  Basal SpO2 93.3%, low SpO2 89%.    Will have my nurse inform pt that ONO was normal.  Coralyn Helling, MD [facility] Pulmonary/Critical Care 01/02/2013, 1:12 PM Pager:  [phone] After 3pm call: [phone]

## 2013-01-03 ENCOUNTER — Telehealth: Payer: Self-pay | Admitting: Pulmonary Disease

## 2013-01-03 MED ORDER — NYSTATIN 100000 UNIT/ML MT SUSP: 500000.0000 [IU] | Freq: Four times a day (QID) | OROMUCOSAL | Status: DC

## 2013-01-03 NOTE — Telephone Encounter (Signed)
lmtcb

## 2013-01-03 NOTE — Telephone Encounter (Signed)
Please inform her that Qvar can cause thrush.  She needs to rinse mouth after each time she uses Qvar.  Please send script for nystatin swish/swallow 5 ml four times per day for 5 days.

## 2013-01-03 NOTE — Telephone Encounter (Signed)
Pt is aware of ONO results. 

## 2013-01-03 NOTE — Telephone Encounter (Signed)
Rx has been sent in per VS.  Pt is aware. She states that she has been rinsing her mouth out after using Qvar.

## 2013-01-03 NOTE — Telephone Encounter (Signed)
Pt called back to get her ONO results. She is aware of those.   Pt reported having thrush. She is thinking that one of her inhalers is causing this but she isn't sure which one. Currently has some Nystatin but is requesting a refill because she doesn't have much left.  Dr. Craige Cotta - please advise if you are okay with refilling this medication.

## 2013-01-20 DIAGNOSIS — H40009 Preglaucoma, unspecified, unspecified eye: Secondary | ICD-10-CM | POA: Diagnosis not present

## 2013-01-20 DIAGNOSIS — H04129 Dry eye syndrome of unspecified lacrimal gland: Secondary | ICD-10-CM | POA: Diagnosis not present

## 2013-01-20 DIAGNOSIS — E119 Type 2 diabetes mellitus without complications: Secondary | ICD-10-CM | POA: Diagnosis not present

## 2013-01-20 DIAGNOSIS — H25019 Cortical age-related cataract, unspecified eye: Secondary | ICD-10-CM | POA: Diagnosis not present

## 2013-01-22 ENCOUNTER — Encounter: Payer: Medicare Other | Admitting: *Deleted

## 2013-01-26 ENCOUNTER — Encounter: Payer: Self-pay | Admitting: *Deleted

## 2013-02-02 DIAGNOSIS — G541 Lumbosacral plexus disorders: Secondary | ICD-10-CM | POA: Diagnosis not present

## 2013-02-02 DIAGNOSIS — F329 Major depressive disorder, single episode, unspecified: Secondary | ICD-10-CM | POA: Diagnosis not present

## 2013-02-02 DIAGNOSIS — H8190 Unspecified disorder of vestibular function, unspecified ear: Secondary | ICD-10-CM | POA: Diagnosis not present

## 2013-02-02 DIAGNOSIS — H81399 Other peripheral vertigo, unspecified ear: Secondary | ICD-10-CM | POA: Diagnosis not present

## 2013-02-14 ENCOUNTER — Ambulatory Visit (INDEPENDENT_AMBULATORY_CARE_PROVIDER_SITE_OTHER): Payer: Medicare Other | Admitting: *Deleted

## 2013-02-14 DIAGNOSIS — I429 Cardiomyopathy, unspecified: Secondary | ICD-10-CM | POA: Diagnosis not present

## 2013-02-14 DIAGNOSIS — I5022 Chronic systolic (congestive) heart failure: Secondary | ICD-10-CM | POA: Diagnosis not present

## 2013-02-22 LAB — REMOTE ICD DEVICE
FVT: 0
HV IMPEDENCE: 62 Ohm
RV LEAD IMPEDENCE ICD: 361 Ohm
RV LEAD THRESHOLD: 1 V
TOT-0002: 0
TOT-0006: 20130522000000
TZAT-0004SLOWVT: 8
TZAT-0004SLOWVT: 8
TZAT-0011SLOWVT: 10 ms
TZAT-0011SLOWVT: 10 ms
TZAT-0012FASTVT: 170 ms
TZAT-0012SLOWVT: 170 ms
TZAT-0018FASTVT: NEGATIVE
TZAT-0019FASTVT: 8 V
TZAT-0019SLOWVT: 8 V
TZAT-0019SLOWVT: 8 V
TZAT-0020FASTVT: 1.5 ms
TZAT-0020SLOWVT: 1.5 ms
TZON-0003SLOWVT: 300 ms
TZON-0003VSLOWVT: 400 ms
TZST-0001FASTVT: 2
TZST-0001FASTVT: 5
TZST-0001FASTVT: 6
TZST-0001SLOWVT: 4
TZST-0001SLOWVT: 5
TZST-0002FASTVT: NEGATIVE
TZST-0002FASTVT: NEGATIVE
TZST-0003SLOWVT: 35 J
TZST-0003SLOWVT: 35 J

## 2013-03-01 DIAGNOSIS — H81399 Other peripheral vertigo, unspecified ear: Secondary | ICD-10-CM | POA: Diagnosis not present

## 2013-03-01 DIAGNOSIS — G541 Lumbosacral plexus disorders: Secondary | ICD-10-CM | POA: Diagnosis not present

## 2013-03-01 DIAGNOSIS — H8190 Unspecified disorder of vestibular function, unspecified ear: Secondary | ICD-10-CM | POA: Diagnosis not present

## 2013-03-01 DIAGNOSIS — F329 Major depressive disorder, single episode, unspecified: Secondary | ICD-10-CM | POA: Diagnosis not present

## 2013-03-13 ENCOUNTER — Other Ambulatory Visit: Payer: Self-pay

## 2013-03-13 MED ORDER — SPIRONOLACTONE 25 MG PO TABS: 25.0000 mg | ORAL_TABLET | Freq: Every day | ORAL | Status: DC

## 2013-03-19 ENCOUNTER — Encounter: Payer: Self-pay | Admitting: *Deleted

## 2013-04-05 DIAGNOSIS — G541 Lumbosacral plexus disorders: Secondary | ICD-10-CM | POA: Diagnosis not present

## 2013-04-05 DIAGNOSIS — M542 Cervicalgia: Secondary | ICD-10-CM | POA: Diagnosis not present

## 2013-04-05 DIAGNOSIS — H81399 Other peripheral vertigo, unspecified ear: Secondary | ICD-10-CM | POA: Diagnosis not present

## 2013-04-05 DIAGNOSIS — M546 Pain in thoracic spine: Secondary | ICD-10-CM | POA: Diagnosis not present

## 2013-04-05 DIAGNOSIS — H8190 Unspecified disorder of vestibular function, unspecified ear: Secondary | ICD-10-CM | POA: Diagnosis not present

## 2013-04-05 DIAGNOSIS — M545 Low back pain: Secondary | ICD-10-CM | POA: Diagnosis not present

## 2013-04-05 DIAGNOSIS — F329 Major depressive disorder, single episode, unspecified: Secondary | ICD-10-CM | POA: Diagnosis not present

## 2013-04-05 DIAGNOSIS — M5430 Sciatica, unspecified side: Secondary | ICD-10-CM | POA: Diagnosis not present

## 2013-04-11 ENCOUNTER — Telehealth: Payer: Self-pay | Admitting: Neurology

## 2013-04-11 NOTE — Telephone Encounter (Signed)
I spoke to patient and she has new concerns, stating that she has started having jerking, seizures? In her sleep.  But starting today she was shaking and jerking once she was up.  She is concerned and would like to come in and see the doctor.

## 2013-04-11 NOTE — Telephone Encounter (Signed)
Can she see PCP first , please??. We had multiple encounters for no organic speech and movement problems.

## 2013-04-12 NOTE — Telephone Encounter (Signed)
Left message for patient that Dr. Vickey Huger recommends that she see her PCP first.  I told her to let us know if she couldn't get in with PCP to give [place] a call.

## 2013-04-13 DIAGNOSIS — R5383 Other fatigue: Secondary | ICD-10-CM | POA: Diagnosis not present

## 2013-04-13 DIAGNOSIS — F449 Dissociative and conversion disorder, unspecified: Secondary | ICD-10-CM | POA: Diagnosis not present

## 2013-04-13 DIAGNOSIS — F5089 Other specified eating disorder: Secondary | ICD-10-CM | POA: Diagnosis not present

## 2013-04-13 DIAGNOSIS — I509 Heart failure, unspecified: Secondary | ICD-10-CM | POA: Diagnosis not present

## 2013-04-13 DIAGNOSIS — Z23 Encounter for immunization: Secondary | ICD-10-CM | POA: Diagnosis not present

## 2013-04-13 DIAGNOSIS — E119 Type 2 diabetes mellitus without complications: Secondary | ICD-10-CM | POA: Diagnosis not present

## 2013-04-13 DIAGNOSIS — C50919 Malignant neoplasm of unspecified site of unspecified female breast: Secondary | ICD-10-CM | POA: Diagnosis not present

## 2013-04-13 DIAGNOSIS — I110 Hypertensive heart disease with heart failure: Secondary | ICD-10-CM | POA: Diagnosis not present

## 2013-04-13 DIAGNOSIS — R5381 Other malaise: Secondary | ICD-10-CM | POA: Diagnosis not present

## 2013-04-13 DIAGNOSIS — G479 Sleep disorder, unspecified: Secondary | ICD-10-CM | POA: Diagnosis not present

## 2013-04-23 ENCOUNTER — Encounter: Payer: Self-pay | Admitting: Internal Medicine

## 2013-04-23 ENCOUNTER — Telehealth: Payer: Self-pay | Admitting: Neurology

## 2013-04-25 ENCOUNTER — Telehealth: Payer: Self-pay | Admitting: *Deleted

## 2013-04-25 NOTE — Telephone Encounter (Signed)
Patient scheduled.

## 2013-04-25 NOTE — Telephone Encounter (Signed)
ERROR

## 2013-04-27 ENCOUNTER — Encounter: Payer: Self-pay | Admitting: Neurology

## 2013-04-27 ENCOUNTER — Encounter (INDEPENDENT_AMBULATORY_CARE_PROVIDER_SITE_OTHER): Payer: Self-pay

## 2013-04-27 ENCOUNTER — Ambulatory Visit (INDEPENDENT_AMBULATORY_CARE_PROVIDER_SITE_OTHER): Payer: Medicare Other | Admitting: Neurology

## 2013-04-27 VITALS — BP 98/62 | HR 107 | Resp 18 | Ht 69.0 in | Wt 266.0 lb

## 2013-04-27 DIAGNOSIS — G43909 Migraine, unspecified, not intractable, without status migrainosus: Secondary | ICD-10-CM | POA: Diagnosis not present

## 2013-04-27 DIAGNOSIS — I428 Other cardiomyopathies: Secondary | ICD-10-CM

## 2013-04-27 DIAGNOSIS — G253 Myoclonus: Secondary | ICD-10-CM

## 2013-04-27 DIAGNOSIS — T50904A Poisoning by unspecified drugs, medicaments and biological substances, undetermined, initial encounter: Secondary | ICD-10-CM | POA: Diagnosis not present

## 2013-04-27 DIAGNOSIS — F070 Personality change due to known physiological condition: Secondary | ICD-10-CM

## 2013-04-27 DIAGNOSIS — G620 Drug-induced polyneuropathy: Secondary | ICD-10-CM

## 2013-04-27 DIAGNOSIS — F09 Unspecified mental disorder due to known physiological condition: Secondary | ICD-10-CM

## 2013-04-27 DIAGNOSIS — R279 Unspecified lack of coordination: Secondary | ICD-10-CM

## 2013-04-27 DIAGNOSIS — R270 Ataxia, unspecified: Secondary | ICD-10-CM

## 2013-04-27 DIAGNOSIS — F0789 Other personality and behavioral disorders due to known physiological condition: Secondary | ICD-10-CM | POA: Insufficient documentation

## 2013-04-27 DIAGNOSIS — I420 Dilated cardiomyopathy: Secondary | ICD-10-CM

## 2013-04-27 HISTORY — DX: Unspecified mental disorder due to known physiological condition: F09

## 2013-04-27 HISTORY — DX: Drug-induced polyneuropathy: G62.0

## 2013-04-27 HISTORY — DX: Ataxia, unspecified: R27.0

## 2013-04-27 MED ORDER — DIVALPROEX SODIUM ER 250 MG PO TB24: 250.0000 mg | ORAL_TABLET | Freq: Every day | ORAL | Status: DC

## 2013-04-27 NOTE — Progress Notes (Signed)
[facility]  Provider:  Melvyn Novas, M D  Referring Provider: Maurice Small, MD Primary Care Physician:  Astrid Divine, MD  Chief Complaint  Patient presents with  . SEIZURE DISORDER    HPI:  [name] is a 42 y.o. female  Is seen here as a referral Work in ,   from Dr. Valentina Lucks for possible nocturnal seizures.    [name] is a well-established patient in our practice I have followed for over 6 years now. The patient developed breast cancer at a young age, she was diagnosed more or less coincidental after a mammogram was offered at her workplace.  She underwent surgery, chemotherapy and radiation.  Late effects of her therapies were a cardiomyopathy with reduced ejection fraction -for which he follows regularly with cardiology, a neuropathy, and overall lethargy and malaise.  The patient never felt quite as energetic or rested as she did prior to her transfer diagnoses. She also developed diabetes and PICA. She is eating ice cubes and starch, she tested negative for iron deficiency anemia.  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  She still speaks this way today, poorly modulated dysarthic speech today.  MRI in  07-2011 , before implanation of her defibrillator , was normal- no extensive WM disease, nor mets nor strokes.  EEG was normal in February 2013 ,but for  benzodiazepine related artefact.  A polysomnography on 08-06-2011 for fatigue and EDS was negative for apnea, her AHI was 0.5/hr.  She has a lot of fatigue she does not sleep well and her mother now reported that she has been jerks and jumps.  The way her mother describes it to me today is that [name] twitches in all 4 extremities at once- sometimes has ophistotonus, the whole body jerking with a pelvic thrust   , and sometimes the   movements are repeated so often that she is concerned about her daughter contracting injuries. The spells seem to star mild and increase in amplitude,  she is hitting furniture.  She has also had a tongue bite ,chewed her tongue at night which is frequently associated with seizures.  She is completely amnestic for the events, and her mother reports these can go on for several minutes, 20- 30 minutes. She remains asleep and does not wake up. At times she yells loudly in her sleep, amnestic for these spells , too.   She has noticed isolated myoclonic jerks in one hand or foot in daytime, mostly in her right , dominant hand.  She is just very tired afterwards. She has not had incontinence with this. She has interstitial cystitis at baseline.    Review of Systems: Out of a complete 14 system review, the patient complains of only the following symptoms, and all other reviewed systems are negative. PICA ? , Myoclonic jerks in daytime, clusters of generalized tonic clonic spells at night in sleep and yelling . Frontal lobe origin ?   History   Social History  . Marital Status: Married    Spouse Name: N/A    Number of Children: N/A  . Years of Education: N/A   Occupational History  . Not on file.   Social History Main Topics  . Smoking status: Never Smoker   . Smokeless tobacco: Never Used  . Alcohol Use: Yes     Comment: 11/03/11 "maybe  once a month"  . Drug Use: No  . Sexual Activity: Not on file   Other Topics Concern  . Not on file   Social History Narrative   Regular exercise    Family History  Problem Relation Age of Onset  . Coronary artery disease    . Heart attack    . Heart disease Mother   . Heart disease Maternal Uncle   . Cancer Paternal Uncle     colon  . Heart disease Maternal Grandmother   . Cancer Paternal Grandmother     ovarian    Past Medical History  Diagnosis Date  . CHF (congestive heart failure)     due to non-ischemic cardiomyopathy, thought to be  chemotherapy induced;  cath 7/12: normal cors, EF 20-25%. Cardiac MRI 05/2011 EF 32%. ICD implantation 10/2011 ([employer])  . Peripheral neuropathy     chemo- induced  . Hypertension     c/b orthostatic hypotention  . Depression   . Palpitation     normal sinus rhythm only on 21 day heart monitor  . Obesity   . Fibromyalgia   . GERD (gastroesophageal reflux disease)   . Heart murmur   . Breast calcifications     right breast  . Interstitial cystitis   . Anxiety   . Nonischemic cardiomyopathy     related to chemo; EF 30% 05/2011  . ICD (implantable cardiac defibrillator) in place   . Pneumonia 2000's    "once"  . Type II diabetes mellitus   . Blood transfusion 1980's    1987 or 1988  . Anemia 1980's    [id number]  . History of stomach ulcers   . Breast cancer     right;  . Conversion disorder   . 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     Past Surgical History  Procedure Laterality Date  . Laparoscopic endometriosis fulguration  1990's  . Cardiac defibrillator placement  11/03/11  . Tonsillectomy  1980's  . Tubal ligation  1990's  . Vaginal hysterectomy  2000's  . Cholecystectomy  ~ 2009  . Breast lumpectomy  2008; 2013    right  . Port-a-cath removal  2010?    left chest; placed in 2008  . Nasal sinus surgery      Current Outpatient Prescriptions  Medication Sig Dispense Refill  . albuterol (PROAIR HFA) 108 (90 BASE) MCG/ACT inhaler Inhale 2 puffs into the lungs every 6 (six) hours as needed for wheezing.  1 Inhaler  3  . B Complex-C (B-COMPLEX WITH VITAMIN C) tablet Take 1 tablet by mouth daily.      . beclomethasone (QVAR) 80 MCG/ACT inhaler Inhale 2 puffs into the lungs 2 (two) times daily.  1 Inhaler  2  . beta carotene w/minerals (OCUVITE) tablet Take 1 tablet by mouth daily.      . carvedilol (COREG) 25 MG tablet Take 1 tablet (25 mg total) by mouth 2 (two) times  daily with a meal.  60 tablet  6  . Cholecalciferol (VITAMIN D3) 5000 UNITS CAPS Take 1 capsule by mouth daily.       . Coenzyme Q10 (CO Q-10) 100 MG CAPS Take 100 mg by mouth daily.      . Cyanocobalamin 2500 MCG SUBL Place 2,500 mcg under the tongue daily.      . digoxin (LANOXIN) 0.125 MG tablet Take 1 tablet (125 mcg total) by mouth daily.  30 tablet  6  . DULoxetine (CYMBALTA) 60 MG capsule Take 60 mg by mouth daily.      . Flax OIL Take 10 mLs by mouth daily.      . Flaxseed, Linseed, (FLAX SEEDS PO) Take by mouth daily. Ground Flax Seeds-1 scoop daily (Scoop came with product)      . GLUCOSAMINE-CHONDROITIN PO Take 1,200 mg by mouth daily.      Marland Kitchen lisinopril (PRINIVIL,ZESTRIL) 10 MG tablet Take 1 tablet (10 mg total) by mouth 2 (two) times daily.  60 tablet  6  . LORazepam (ATIVAN) 1 MG tablet Take 1 mg by mouth 2 (two) times daily as needed. For anxiety      . methocarbamol (ROBAXIN) 750 MG tablet Take 750 mg by mouth 2 (two) times daily.       . metolazone (ZAROXOLYN) 2.5 MG tablet Take 1 tablet (2.5 mg total) by mouth as directed.  5 tablet  1  . Multiple Vitamin (MULTIVITAMIN WITH MINERALS) TABS Take 1 tablet by mouth daily.      Marland Kitchen nystatin (MYCOSTATIN) 100000 UNIT/ML suspension Take 5 mLs (500,000 Units total) by mouth 4 (four) times daily.  60 mL  0  . oxyCODONE-acetaminophen (PERCOCET) 10-325 MG per tablet Take 1 tablet by mouth every 4 (four) hours as needed. For pain      . pentosan polysulfate (ELMIRON) 100 MG capsule Take 100 mg by mouth 2 (two) times daily.      . potassium chloride SA (K-DUR,KLOR-CON) 20 MEQ tablet Take 20 mEq by mouth daily. With fluid pill      . pregabalin (LYRICA) 200 MG capsule Take 600 mg by mouth at bedtime.       . RABEprazole (ACIPHEX) 20 MG tablet Take 20 mg by mouth daily.        Marland Kitchen spironolactone (ALDACTONE) 25 MG tablet Take 1 tablet (25 mg total) by mouth daily.  30 tablet  6  . torsemide (DEMADEX) 20 MG tablet FOR FLUID RETENTION TO BE TAKEN WITH  POTASSIUM  60 tablet  3  . zolpidem (AMBIEN CR) 12.5 MG CR tablet Take 12.5 mg by mouth.       Marland Kitchen PATADAY 0.2 % SOLN       . PRODIGY NO CODING BLOOD GLUC test strip        No current facility-administered medications for this visit.    Allergies as of 04/27/2013 - Review Complete 04/27/2013  Allergen Reaction Noted  . Other Rash 10/28/2011  . Reglan [metoclopramide hcl] Anxiety 07/25/2008    Vitals: BP 98/62  Pulse 107  Resp 18  Ht 5\' 9"  (1.753 m)  Wt 266 lb (120.657 kg)  BMI 39.26 kg/m2 Last Weight:  Wt Readings from Last 1 Encounters:  04/27/13 266 lb (120.657 kg)   Last Height:   Ht Readings from Last 1 Encounters:  04/27/13 5\' 9"  (1.753 m)    Physical exam:  General: The patient is awake, alert and appears not in acute distress. The patient is well groomed. Head: Normocephalic, atraumatic. Neck is supple. Mallampati 3, neck circumference: 15 inches . Please note that there was no tongue bite mark.  Cardiovascular:  Regular rate and rhythm , without  murmurs or carotid bruit, and without distended neck veins. EF was reduced to 28% .  Respiratory: Lungs are clear to auscultation. Skin:  Without evidence of edema, or rash Trunk: BMI is  elevated .  Neurologic exam : The patient is awake and alert, oriented to place and  time.  Memory subjective described as impaired . There is a normal attention span & concentration ability.  Speech is non- fluent with poorly modulated -speech ,  dysarthria, dysphonia , aphasia. Mood and affect are  anxious, pleading .  Cranial nerves: Pupils are equal and briskly reactive to light. Funduscopic exam without  evidence of pallor or edema. Extraocular movements  in both planes intact and without nystagmus.  Visual fields by finger perimetry are intact. Hearing to finger rub intact.  Facial sensation intact to fine touch. Facial motor strength is symmetric and tongue and uvula move midline.  Motor exam:   Normal tone , give away weakness  and normal muscle bulk in all extremities. Bilateral poor grip .   Sensory:  Fine touch, pinprick and vibration were reduced in hands and feet,  Proprioception affected , too.   Coordination: Rapid alternating movements in the fingers/hands is tested and normal. Finger-to-nose maneuver with evidence of ataxia, dysmetria.  Gait and station: Patient walks without assistive device . Stance is stable but wide based . Tandem gait is fragmented and had to be deferred. Romberg testing positive retropulsion.  Deep tendon reflexes: in the  upper and lower extremities are brisk, symmetric and intact. Babinski maneuver downgoing.   Assessment:  After physical and neurologic examination, review of laboratory studies, imaging, neurophysiology testing and pre-existing records, assessment is   1) likely conversion disorder component. Need to differentiate from frontal lobe seizures, myoclonic epilepsy, paraneoplastic epilepsy. 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  4) bronchitis, asthmatic -new  5) beast cancer . Pain management and oncology follow her.   Plan:  Treatment plan and additional workup : Order full EEG sleep study . Add Co2 canula for this patient with cardiomypathy.   We try a Banana montage for seizure detection.   Stay on LYRICA. Stay on Lorazepam at night.  Do not take the lorazepam in the sleep lab, but bring it with you to the sleepl ab. Marland Kitchen

## 2013-04-27 NOTE — Addendum Note (Signed)
Addended by: Melvyn Novas on: 04/27/2013 12:50 PM   Modules accepted: Orders

## 2013-04-27 NOTE — Patient Instructions (Addendum)
I have ordered a nocturnal sleep study  With a full EEG montage. I have not proven any epilepsy at this point, but the tests will hopefully give [place] answers as to the presence of Myoclonus versus Myoclonic Epilepsy.   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 It refers to a collection of neurological disorders. These are both rare and degenerative. It is characterized by:  Epilepsy.  Cognitive impairment.  Myoclonus.  Progressive ataxia. Some cases are due to mitochondrial abnormalities. SYMPTOMS  Symptoms include:  Seizures.  Tremor.  Reduced muscle coordination. Onset of the disorder generally occurs in early adulthood. Tremor may begin in one extremity. Later it may spread to involve the entire voluntary muscular system. Arms are usually more affected than legs. TREATMENT  Treatment of Ramsay Hunt type I is symptomatic. Myoclonus and seizures may be treated with drugs like valproate. The progression of the disorder is usually 10 years or longer. Document Released: 05/21/2002 Document Revised: 11/30/2011 Document Reviewed: 05/31/2005 [facility] Patient Information 2014 [place], [place].

## 2013-05-01 ENCOUNTER — Telehealth: Payer: Self-pay | Admitting: *Deleted

## 2013-05-01 NOTE — Telephone Encounter (Signed)
Spoke to patient's mother, she calls in because patient is scheduled for sleep study tonight.  She explains she has had seizures all morning and she is already exhausted and may sleep much of today to recover.  She generally experiences seizures at night.  I rescheduled patient for Friday night at 8:00 PM and her husband will plan on staying in the room with her as her support person to help her ambulate as needed.  Her mother describes her nocturnal seizures as convulsive, she explains she has a wild look about her eyes and then starts jerking, when the seizure is over, she falls into a deep sleep for about 1-2 hours.  She explains it lasts approximately 30 minutes. Steward Drone also explains that because of patient's conversion disorder, she sometimes has difficulty with ambulation and balance and Steward Drone explains, "we watch her like a hawk at night especially".  Sleep Tech has been arranged and made aware of patient's needs as well as the guest who will be staying.

## 2013-05-02 DIAGNOSIS — R51 Headache: Secondary | ICD-10-CM

## 2013-05-03 ENCOUNTER — Ambulatory Visit
Admission: RE | Admit: 2013-05-03 | Discharge: 2013-05-03 | Disposition: A | Discharge status: Home / Self Care | Payer: Medicare Other | Source: Ambulatory Visit | Attending: Neurology | Admitting: Neurology

## 2013-05-03 DIAGNOSIS — M546 Pain in thoracic spine: Secondary | ICD-10-CM | POA: Diagnosis not present

## 2013-05-03 DIAGNOSIS — G253 Myoclonus: Secondary | ICD-10-CM

## 2013-05-03 DIAGNOSIS — G43909 Migraine, unspecified, not intractable, without status migrainosus: Secondary | ICD-10-CM

## 2013-05-03 DIAGNOSIS — M542 Cervicalgia: Secondary | ICD-10-CM | POA: Diagnosis not present

## 2013-05-03 DIAGNOSIS — R270 Ataxia, unspecified: Secondary | ICD-10-CM

## 2013-05-03 DIAGNOSIS — I420 Dilated cardiomyopathy: Secondary | ICD-10-CM

## 2013-05-03 DIAGNOSIS — M545 Low back pain: Secondary | ICD-10-CM | POA: Diagnosis not present

## 2013-05-03 DIAGNOSIS — H81399 Other peripheral vertigo, unspecified ear: Secondary | ICD-10-CM | POA: Diagnosis not present

## 2013-05-03 DIAGNOSIS — F0789 Other personality and behavioral disorders due to known physiological condition: Secondary | ICD-10-CM

## 2013-05-03 DIAGNOSIS — H8190 Unspecified disorder of vestibular function, unspecified ear: Secondary | ICD-10-CM | POA: Diagnosis not present

## 2013-05-03 DIAGNOSIS — G620 Drug-induced polyneuropathy: Secondary | ICD-10-CM

## 2013-05-03 DIAGNOSIS — G541 Lumbosacral plexus disorders: Secondary | ICD-10-CM | POA: Diagnosis not present

## 2013-05-03 DIAGNOSIS — Z79899 Other long term (current) drug therapy: Secondary | ICD-10-CM | POA: Diagnosis not present

## 2013-05-07 ENCOUNTER — Telehealth: Payer: Self-pay | Admitting: Neurology

## 2013-05-07 NOTE — Telephone Encounter (Signed)
Explained to patient that medicine is for treatment of seizure(divalproex) she stated that she was unaware of why the prescription was ordered.

## 2013-05-07 NOTE — Telephone Encounter (Signed)
Please advise 

## 2013-05-16 ENCOUNTER — Encounter: Payer: Self-pay | Admitting: Neurology

## 2013-05-17 NOTE — Progress Notes (Signed)
Quick Note:  Shared normal CT results with patient, she verbalized understanding but patient wants to know if the Scan showed any brain damage, cancer on the brain and any seizures ______

## 2013-05-23 ENCOUNTER — Telehealth: Payer: Self-pay | Admitting: Licensed Clinical Social Worker

## 2013-05-23 NOTE — Telephone Encounter (Signed)
CSW rec'd referral to assist with insurance questions. CSW attempted to contact patient via phone. Message left to return call. Lasandra Beech, LCSW [phone]

## 2013-05-24 ENCOUNTER — Ambulatory Visit (INDEPENDENT_AMBULATORY_CARE_PROVIDER_SITE_OTHER): Payer: Medicare Other | Admitting: Internal Medicine

## 2013-05-24 ENCOUNTER — Encounter: Payer: Self-pay | Admitting: Internal Medicine

## 2013-05-24 VITALS — HR 100 | Ht 67.0 in | Wt 264.0 lb

## 2013-05-24 DIAGNOSIS — I5022 Chronic systolic (congestive) heart failure: Secondary | ICD-10-CM

## 2013-05-24 DIAGNOSIS — Z95810 Presence of automatic (implantable) cardiac defibrillator: Secondary | ICD-10-CM

## 2013-05-24 DIAGNOSIS — I10 Essential (primary) hypertension: Secondary | ICD-10-CM

## 2013-05-24 DIAGNOSIS — I428 Other cardiomyopathies: Secondary | ICD-10-CM

## 2013-05-24 LAB — CBC WITH DIFFERENTIAL/PLATELET
Basophils Relative: 0.5 % (ref 0.0–3.0)
Eosinophils Absolute: 0.1 10*3/uL (ref 0.0–0.7)
Eosinophils Relative: 0.8 % (ref 0.0–5.0)
HCT: 38.9 % (ref 36.0–46.0)
Lymphs Abs: 2.7 10*3/uL (ref 0.7–4.0)
MCHC: 31.7 g/dL (ref 30.0–36.0)
MCV: 85 fl (ref 78.0–100.0)
Monocytes Absolute: 0.9 10*3/uL (ref 0.1–1.0)
Neutro Abs: 6.9 10*3/uL (ref 1.4–7.7)
Neutrophils Relative %: 64.8 % (ref 43.0–77.0)
RBC: 4.57 Mil/uL (ref 3.87–5.11)
RDW: 20.6 % — ABNORMAL HIGH (ref 11.5–14.6)
WBC: 10.6 10*3/uL — ABNORMAL HIGH (ref 4.5–10.5)

## 2013-05-24 LAB — MDC_IDC_ENUM_SESS_TYPE_INCLINIC
Lead Channel Impedance Value: 361 Ohm
Lead Channel Pacing Threshold Amplitude: 1 V
Lead Channel Pacing Threshold Pulse Width: 0.4 ms
Lead Channel Sensing Intrinsic Amplitude: 16.375 mV
Lead Channel Sensing Intrinsic Amplitude: 17.125 mV
Lead Channel Setting Pacing Amplitude: 2 V
Zone Setting Detection Interval: 250 ms

## 2013-05-24 LAB — BASIC METABOLIC PANEL
CO2: 30 mEq/L (ref 19–32)
Calcium: 8.8 mg/dL (ref 8.4–10.5)
Sodium: 137 mEq/L (ref 135–145)

## 2013-05-24 MED ORDER — LISINOPRIL 10 MG PO TABS: ORAL_TABLET | ORAL | Status: DC

## 2013-05-24 MED ORDER — SPIRONOLACTONE 25 MG PO TABS: 12.5000 mg | ORAL_TABLET | Freq: Every day | ORAL | Status: DC

## 2013-05-24 NOTE — Assessment & Plan Note (Signed)
The patient's device was interrogated.  The information was reviewed. No changes were made in the programming.    

## 2013-05-24 NOTE — Assessment & Plan Note (Signed)
Euvolemic incidentally hypotensive. We'll decrease her medications as noted. We will check blood work including a CBC and TSH because of tachycardia although I suspect his reactive hypotension metabolic profile and digoxin level.

## 2013-05-24 NOTE — Progress Notes (Signed)
Patient Care Team: Maurice Small, MD as PCP - General (Family Medicine)   HPI  [name] is a 42 y.o. female Seen in followup for an ICD implanted in May 2013 for primary prevention with nonischemic cardiomyopathy and a QRS  She is doing relatively well with stable shortness of breath. She does not have peripheral edema.  She is interested in bariatric surgery.  She has significant fatigue and dizziness   Past Medical History  Diagnosis Date  . 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 Cardiac MRI 05/2011 EF 32%. ICD implantation 10/2011 ([employer])  . Peripheral neuropathy     chemo- induced  . Hypertension     c/b orthostatic hypotention  . Depression   . Palpitation     normal sinus rhythm only on 21 day heart monitor  . Obesity   . Fibromyalgia   . GERD (gastroesophageal reflux disease)   . Heart murmur   . Breast calcifications     right breast  . Interstitial cystitis   . Anxiety   . Nonischemic cardiomyopathy     related to chemo; EF 30% 05/2011  . ICD (implantable cardiac defibrillator) in place   . Pneumonia 2000's    "once"  . Type II diabetes mellitus   . Blood transfusion 1980's    1987 or 1988  . Anemia 1980's    [id number]  . History of stomach ulcers   . Breast cancer     right;  . Conversion disorder   . 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   . 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 Laparoscopic endometriosis fulguration  1990's  . Cardiac defibrillator placement  11/03/11  . Tonsillectomy  1980's  . Tubal ligation  1990's  . Vaginal hysterectomy  2000's  . Cholecystectomy  ~ 2009  . Breast lumpectomy  2008; 2013    right  . Port-a-cath  removal  2010?    left chest; placed in 2008  . Nasal sinus surgery      Current Outpatient Prescriptions  Medication Sig Dispense Refill  . albuterol (PROAIR HFA) 108 (90 BASE) MCG/ACT inhaler Inhale 2 puffs into the lungs every 6 (six) hours as needed for wheezing.  1 Inhaler  3  . B Complex-C (B-COMPLEX WITH VITAMIN C) tablet Take 1 tablet by mouth daily.      . beclomethasone (QVAR) 80 MCG/ACT inhaler Inhale 2 puffs into the lungs 2 (two) times daily.  1 Inhaler  2  . beta carotene w/minerals (OCUVITE) tablet Take 1 tablet by mouth daily.      . carvedilol (COREG) 25 MG tablet Take 1 tablet (25 mg total) by mouth 2 (two) times daily with a meal.  60 tablet  6  . Cholecalciferol (VITAMIN D3) 5000 UNITS CAPS Take 1 capsule by mouth daily.       . Coenzyme Q10 (CO Q-10) 100 MG CAPS Take 100 mg by mouth daily.      . Cyanocobalamin 2500 MCG SUBL Place 2,500 mcg under the tongue daily.      . digoxin (LANOXIN) 0.125 MG tablet Take 1 tablet (125 mcg total) by mouth daily.  30 tablet  6  . divalproex (DEPAKOTE ER) 250 MG  24 hr tablet Take 1 tablet (250 mg total) by mouth daily.  90 tablet  3  . DULoxetine (CYMBALTA) 60 MG capsule Take 60 mg by mouth daily.      . Flax OIL Take 10 mLs by mouth daily.      . Flaxseed, Linseed, (FLAX SEEDS PO) Take by mouth daily. Ground Flax Seeds-1 scoop daily (Scoop came with product)      . GLUCOSAMINE-CHONDROITIN PO Take 1,200 mg by mouth daily.      Marland Kitchen lisinopril (PRINIVIL,ZESTRIL) 10 MG tablet Take 1 tablet (10 mg total) by mouth 2 (two) times daily.  60 tablet  6  . LORazepam (ATIVAN) 1 MG tablet Take 1 mg by mouth 2 (two) times daily as needed. For anxiety      . methocarbamol (ROBAXIN) 750 MG tablet Take 750 mg by mouth 2 (two) times daily.       . metolazone (ZAROXOLYN) 2.5 MG tablet Take 1 tablet (2.5 mg total) by mouth as directed.  5 tablet  1  . Multiple Vitamin (MULTIVITAMIN WITH MINERALS) TABS Take 1 tablet by mouth daily.      Marland Kitchen nystatin  (MYCOSTATIN) 100000 UNIT/ML suspension Take 5 mLs (500,000 Units total) by mouth 4 (four) times daily.  60 mL  0  . oxyCODONE-acetaminophen (PERCOCET) 10-325 MG per tablet Take 1 tablet by mouth every 4 (four) hours as needed. For pain      . PATADAY 0.2 % SOLN       . pentosan polysulfate (ELMIRON) 100 MG capsule Take 100 mg by mouth 2 (two) times daily.      . potassium chloride SA (K-DUR,KLOR-CON) 20 MEQ tablet Take 20 mEq by mouth daily. With fluid pill      . pregabalin (LYRICA) 200 MG capsule Take 600 mg by mouth at bedtime.       Marland Kitchen PRODIGY NO CODING BLOOD GLUC test strip       . RABEprazole (ACIPHEX) 20 MG tablet Take 20 mg by mouth daily.        Marland Kitchen spironolactone (ALDACTONE) 25 MG tablet Take 1 tablet (25 mg total) by mouth daily.  30 tablet  6  . torsemide (DEMADEX) 20 MG tablet FOR FLUID RETENTION TO BE TAKEN WITH POTASSIUM  60 tablet  3  . zolpidem (AMBIEN CR) 12.5 MG CR tablet Take 12.5 mg by mouth.        No current facility-administered medications for this visit.    Allergies  Allergen Reactions  . Other Rash    Chloraprep  . Reglan [Metoclopramide Hcl] Anxiety    Review of Systems negative except from HPI and PMH  Physical Exam Pulse 100  Ht 5\' 7"  (1.702 m)  Wt 264 lb (119.75 kg)  BMI 41.34 kg/m2 Well developed and morbildy in no acute distress HENT normal E scleral and icterus clear Neck Supple JVP flat; carotids brisk and full Clear to ausculation  Device pocket well healed; without hematoma or erythema.  There is no tethering Regular rate and rhythm, no murmurs gallops or rub Soft with active bowel sounds No clubbing cyanosis none Edema Alert and oriented, grossly normal motor and sensory function Skin Warm and Dry  ECG demonstrates sinus rhythm at 100 Intervals 16/08/40   Assessment and  Plan

## 2013-05-24 NOTE — Assessment & Plan Note (Signed)
As above.

## 2013-05-24 NOTE — Assessment & Plan Note (Signed)
The patient has significant hypotension. We will decrease her lisinopril 10 twice a day--5 at bedtime and her Aldactone from 25--12.5 she is reluctant to have this reduced her carvedilol. We'll have her come back in about 2 weeks time and see SW. The event that her blood pressure still below 90 I would decrease her carvedilol. We'll also arrange followup with Dr. Dorthea Cove in CHF

## 2013-05-24 NOTE — Patient Instructions (Signed)
Your physician recommends that you return for lab work today: BMET, Dig level, CBCD, TSH  Your physician has recommended you make the following change in your medication:  1) Decrease Lisinopril to 5 mg daily at bedtime 2) Stop Duloxetine 3) Decrease Spironolactone to 12.5 mg daily  Your physician recommends that you schedule a follow-up appointment in: 2 weeks with [name] for BP check.  Your physician recommends that you schedule a follow-up appointment in: 6-8 weeks with heart failure clinic.  Your physician wants you to follow-up in: 1 year with Dr. Graciela Husbands.  You will receive a reminder letter in the mail two months in advance. If you don't receive a letter, please call our office to schedule the follow-up appointment.

## 2013-05-25 ENCOUNTER — Telehealth: Payer: Self-pay | Admitting: Licensed Clinical Social Worker

## 2013-05-25 NOTE — Telephone Encounter (Signed)
CSW attempted to contact patient again to assist with questions regarding medication/insurance coverage. CSW left message for return call. Lasandra Beech, LCSW [phone]

## 2013-05-29 ENCOUNTER — Encounter: Payer: Self-pay | Admitting: Internal Medicine

## 2013-05-29 ENCOUNTER — Telehealth: Payer: Self-pay | Admitting: Licensed Clinical Social Worker

## 2013-05-29 ENCOUNTER — Ambulatory Visit (INDEPENDENT_AMBULATORY_CARE_PROVIDER_SITE_OTHER): Payer: Medicare Other | Admitting: Internal Medicine

## 2013-05-29 VITALS — BP 94/60 | HR 93 | Temp 97.4°F | Ht 67.0 in | Wt 265.0 lb

## 2013-05-29 DIAGNOSIS — I5022 Chronic systolic (congestive) heart failure: Secondary | ICD-10-CM | POA: Diagnosis not present

## 2013-05-29 DIAGNOSIS — R0609 Other forms of dyspnea: Secondary | ICD-10-CM | POA: Diagnosis not present

## 2013-05-29 DIAGNOSIS — J984 Other disorders of lung: Secondary | ICD-10-CM | POA: Diagnosis not present

## 2013-05-29 DIAGNOSIS — R0600 Dyspnea, unspecified: Secondary | ICD-10-CM

## 2013-05-29 NOTE — Assessment & Plan Note (Signed)
PFTs 11/29/12 no obst p B2 with nl dlco   Symptoms are markedly disproportionate to objective findings and not clear this is a lung problem but pt does appear to have difficult airway management issues. DDX of  difficult airways managment all start with A and  include Adherence, Ace Inhibitors, Acid Reflux, Active Sinus Disease, Alpha 1 Antitripsin deficiency, Anxiety masquerading as Airways dz,  ABPA,  allergy(esp in young), Aspiration (esp in elderly), Adverse effects of DPI,  Active smokers, plus two Bs  = Bronchiectasis and Beta blocker use..and one C= CHF  Adherence is always the initial "prime suspect" and is a multilayered concern that requires a "trust but verify" approach in every patient - starting with knowing how to use medications, especially inhalers, correctly, keeping up with refills and understanding the fundamental difference between maintenance and prns vs those medications only taken for a very short course and then stopped and not refilled.   acei top of list of suspects > bp very low so try off  ? Acid (or non-acid) GERD > always difficult to exclude as up to 75% of pts in some series report no assoc GI/ Heartburn symptoms> rec max (24h)  acid suppression and diet restrictions/ reviewed and instructions given in writing.   Beta blockers may be problematic in this setting, esp high dose coreg, may need to consider alternatives if airway symptoms remain difficult to control

## 2013-05-29 NOTE — Telephone Encounter (Signed)
CSW spoke with [facility] via phone. CSW referred to assist with insurance coverage/medication coverage. Patient reports she was recently dropped from her insurance BC/BS due to "late payment". She reports she worked for [employer] from [place] when she was diagnosed with Breast Cancer. She reports that she had an aggressive form of cancer and she developed CHF due to the chemo.  She was able to keep her BS/BC through [employer] all this time but due to late payment she was dropped. She receives [employer] (1,350 monthly) and has Medicare A&B. She reports she recently applied for Medicare D [employer] during open enrollment and will begin prescription coverage June 14, 2013. She states she has many of her meds and will check to and make sure she has enough to get her through until coverage begins 06/14/13. CSW discussed possible eligibility for Medicaid through the medically needy program and explained that she would have to meet a deductible determined by medicaid if eligible.  Patient verbalized understanding of discussion and CSW provided number for medicaid follow up. CSW continues to be available if needed through the HF clinic. Lasandra Beech, LCSW [phone]

## 2013-05-29 NOTE — Progress Notes (Signed)
Chief Complaint  Patient presents with  . Acute Visit    Pt c/o increased SOB for the past 3-4 months.  She states that she gets SOB with or without any exertion. Sometimes gets SOB just walking and can not finish a sentence.     History of Present Illness: [name] is a 42 y.o. female never smoker for evaluation of dyspnea.  eval by Dr Craige Cotta 11/15/12 with ? asthma  rec Qvar two puffs twice per day, and rinse mouth after each use Proair two puffs up to four times per day as needed for cough, wheeze, or chest congestion    05/29/2013 acute ov/[name] re: dtca asthma on ACEi dx of copd in past Chief Complaint  Patient presents with  . Acute Visit    Pt c/o increased SOB for the past 3-4 months.  She states that she gets SOB with or without any exertion. Sometimes gets SOB just walking and can not finish a sentence.   assoc with cough more daytime and min prod white mucus. No better on qvar and albuterol  No obvious day to day or daytime variabilty or assoc   cp or chest tightness, subjective wheeze overt sinus or hb symptoms. No unusual exp hx or h/o childhood pna/ asthma or knowledge of premature birth.  Sleeping ok without nocturnal  or early am exacerbation  of respiratory  c/o's or need for noct saba. Also denies any obvious fluctuation of symptoms with weather or environmental changes or other aggravating or alleviating factors except as outlined above   Current Medications, Allergies, Complete Past Medical History, Past Surgical History, Family History, and Social History were reviewed in [employer] record.  ROS  The following are not active complaints unless bolded sore throat, dysphagia, dental problems, itching, sneezing,  nasal congestion or excess/ purulent secretions, ear ache,   fever, chills, sweats, unintended wt loss, pleuritic or exertional cp, hemoptysis,  orthopnea pnd or leg swelling, presyncope, palpitations, heartburn, abdominal pain,  anorexia, nausea, vomiting, diarrhea  or change in bowel or urinary habits, change in stools or urine, dysuria,hematuria,  rash, arthralgias, visual complaints, headache, numbness weakness or ataxia or problems with walking or coordination,  change in mood/affect or memory.           Tests: Lt heart cath 12/24/10 >> normal coronaries, EF 20 to 25%, normal filling pressures PSG 08/06/11 >> AHI 0.5, SpO2 low 85%, Spent 19 min with SpO2 < 90% Echo 05/24/12 >> EF 20 to 25%, diastolic dysfx, mild AR, mod MR, PAS 48 mm Hg PFT 08/29/12 >> FEV1 1.67 (60%), FEV1% 74, TLC 3.66 (66%), DLCO 84%, no BD  [name]  has a past medical history of CHF (congestive heart failure); Peripheral neuropathy; Hypertension; Depression; Palpitation; Obesity; Fibromyalgia; GERD (gastroesophageal reflux disease); Heart murmur; Breast calcifications; Interstitial cystitis; Anxiety; Nonischemic cardiomyopathy; ICD (implantable cardiac defibrillator) in place; Pneumonia (2000's); Type II diabetes mellitus; Blood transfusion (1980's); Anemia (1980's); History of stomach ulcers; Breast cancer; Conversion disorder; COPD with asthma (11/15/2012); Neuropathy due to drug (04/27/2013); Ataxia (04/27/2013); and Cognitive and neurobehavioral dysfunction (04/27/2013).  [name]  has past surgical history that includes Laparoscopic endometriosis fulguration (1990's); Cardiac defibrillator placement (11/03/11); Tonsillectomy (1980's); Tubal ligation (1990's); Vaginal hysterectomy (2000's); Cholecystectomy (~ 2009); Breast lumpectomy (2008; 2013); Port-a-cath removal (2010?); and Nasal sinus surgery.  Prior to Admission medications   Medication Sig Start Date End Date Taking? Authorizing Provider  B Complex-C (B-COMPLEX WITH VITAMIN C) tablet Take 1 tablet  by mouth daily.   Yes Historical Provider, MD  beta carotene w/minerals (OCUVITE) tablet Take 1 tablet by mouth daily.   Yes Historical Provider, MD  carvedilol (COREG) 25 MG  tablet Take 1 tablet (25 mg total) by mouth 2 (two) times daily with a meal. 04/24/12  Yes Dolores Patty, MD  Cholecalciferol (VITAMIN D3) 5000 UNITS CAPS Take 1 capsule by mouth daily.    Yes Historical Provider, MD  Coenzyme Q10 (CO Q-10) 100 MG CAPS Take 100 mg by mouth daily.   Yes Historical Provider, MD  Cyanocobalamin 2500 MCG SUBL Place 2,500 mcg under the tongue daily.   Yes Historical Provider, MD  digoxin (LANOXIN) 0.125 MG tablet Take 1 tablet (125 mcg total) by mouth daily. 04/24/12 04/24/13 Yes Bevelyn Buckles Bensimhon, MD  DULoxetine (CYMBALTA) 60 MG capsule Take 60 mg by mouth daily.   Yes Historical Provider, MD  Flax OIL Take 10 mLs by mouth daily.   Yes Historical Provider, MD  Flaxseed, Linseed, (FLAX SEEDS PO) Take by mouth daily. Ground Flax Seeds-1 scoop daily (Scoop came with product)   Yes Historical Provider, MD  GLUCOSAMINE-CHONDROITIN PO Take 1,200 mg by mouth daily.   Yes Historical Provider, MD  lisinopril (PRINIVIL,ZESTRIL) 10 MG tablet Take 1 tablet (10 mg total) by mouth 2 (two) times daily. 04/24/12  Yes Bevelyn Buckles Bensimhon, MD  LORazepam (ATIVAN) 1 MG tablet Take 1 mg by mouth 2 (two) times daily as needed. For anxiety   Yes Historical Provider, MD  methocarbamol (ROBAXIN) 750 MG tablet Take 750 mg by mouth 2 (two) times daily.    Yes Historical Provider, MD  metolazone (ZAROXOLYN) 2.5 MG tablet Take 1 tablet (2.5 mg total) by mouth as directed. 07/31/12  Yes Amy D Clegg, NP  Multiple Vitamin (MULTIVITAMIN WITH MINERALS) TABS Take 1 tablet by mouth daily.   Yes Historical Provider, MD  oxyCODONE-acetaminophen (PERCOCET) 10-325 MG per tablet Take 1 tablet by mouth every 4 (four) hours as needed. For pain 03/03/12  Yes Historical Provider, MD  penicillin v potassium (VEETID) 500 MG tablet Take 1 tablet by mouth 2 (two) times daily. 10/30/12  Yes Historical Provider, MD  pentosan polysulfate (ELMIRON) 100 MG capsule Take 100 mg by mouth 2 (two) times daily.   Yes Historical  Provider, MD  potassium chloride SA (K-DUR,KLOR-CON) 20 MEQ tablet Take 20 mEq by mouth daily. With fluid pill 11/04/11  Yes Dayna N Dunn, PA-C  pregabalin (LYRICA) 200 MG capsule Take 600 mg by mouth at bedtime.    Yes Historical Provider, MD  RABEprazole (ACIPHEX) 20 MG tablet Take 20 mg by mouth daily.     Yes Historical Provider, MD  spironolactone (ALDACTONE) 25 MG tablet Take 1 tablet (25 mg total) by mouth daily. 08/10/12  Yes Hadassah Pais, PA-C  torsemide (DEMADEX) 20 MG tablet FOR FLUID RETENTION TO BE TAKEN WITH POTASSIUM 08/30/12  Yes Duke Salvia, MD  zolpidem (AMBIEN CR) 12.5 MG CR tablet Take 12.5 mg by mouth.  03/03/12  Yes Historical Provider, MD    Allergies  Allergen Reactions  . Other Rash    Chloraprep  . Reglan [Metoclopramide Hcl] Anxiety    Her family history includes Cancer in her paternal grandmother and paternal uncle; Coronary artery disease in an other family member; Heart attack in an other family member; Heart disease in her maternal grandmother, maternal uncle, and mother.  She  reports that she has never smoked. She has never used smokeless tobacco. She reports  that she drinks alcohol. She reports that she does not use illicit drugs.      Physical Exam:  General - No distress/ extremely hoarse with voice fatigue and classic pseudowheeze to point of near stridorous upper airway noises ENT - No sinus tenderness, MP 3, no oral exudate, no LAN, no thyromegaly, TM clear, pupils equal/reactive Cardiac - s1s2 regular, 2/6 SM, pulses symmetric Chest - Decreased breath sounds, no wheeze/rales/dullness - no wheeze at all  Back - No focal tenderness Abd - Soft, non-tender, no organomegaly, + bowel sounds Ext - No edema Neuro - Normal strength, cranial nerves intact Skin - No rashes Psych - Normal mood, and behavior   Lab Results  Component Value Date   WBC 10.6* 05/24/2013   HGB 12.3 05/24/2013   HCT 38.9 05/24/2013   MCV 85.0 05/24/2013   PLT 246.0  05/24/2013    Lab Results  Component Value Date   CREATININE 1.2 05/24/2013   BUN 9 05/24/2013   NA 137 05/24/2013   K 4.1 05/24/2013   CL 100 05/24/2013   CO2 30 05/24/2013    Lab Results  Component Value Date   ALT 51* 06/02/2012   AST 43* 06/02/2012   ALKPHOS 95 06/02/2012   BILITOT 0.5 06/02/2012    Lab Results  Component Value Date   TSH 2.53 05/24/2013    BNP    Component Value Date/Time   PROBNP 89.7 07/12/2011 0159      Assessment/Plan:  Coralyn Helling, MD [place] [facility] Pager:  [phone]

## 2013-05-29 NOTE — Patient Instructions (Addendum)
Stop lisinopril and let Dr Bensimon's clinic regulate your blood pressure without ace inhibitors when you return Dev 22 as planned  Aciphex 20 mg Take 30- 60 min before your first and last meals of the day   GERD (REFLUX)  is an extremely common cause of respiratory symptoms, many times with no significant heartburn at all.    It can be treated with medication, but also with lifestyle changes including avoidance of late meals, excessive alcohol, smoking cessation, and avoid fatty foods, chocolate, peppermint, colas, red wine, and acidic juices such as orange juice.  NO MINT OR MENTHOL PRODUCTS SO NO COUGH DROPS  USE SUGARLESS CANDY INSTEAD (jolley ranchers or Stover's sugar free)  NO OIL BASED VITAMINS - use powdered substitutes   If not improved by the first of the year you will need to return to see Dr Craige Cotta or me  for PFT's - otherwise keep you original appt

## 2013-05-29 NOTE — Assessment & Plan Note (Signed)
Agree likely this is obesity related.

## 2013-05-29 NOTE — Assessment & Plan Note (Signed)
ACE inhibitors are problematic in  pts with airway complaints because  even experienced pulmonologists can't always distinguish ace effects from copd/asthma.  By themselves they don't actually cause a problem, much like oxygen can't by itself start a fire, but they certainly serve as a powerful catalyst or enhancer for any "fire"  or inflammatory process in the upper airway, be it caused by an ET  tube or more commonly reflux (especially in the obese or pts with known GERD or who are on biphoshonates).   As she is borderline hypotensive will leave off acei and all afterload reducers until goes to chf clinic 06/04/13 but would avoid this class indefinitely and even may need consider alternative to coreg if asthma symptoms remain so difficult  to control.

## 2013-06-04 ENCOUNTER — Ambulatory Visit (HOSPITAL_COMMUNITY): Admission: RE | Admit: 2013-06-04 | Payer: Medicare Other | Source: Ambulatory Visit

## 2013-06-05 ENCOUNTER — Telehealth: Payer: Self-pay | Admitting: *Deleted

## 2013-06-05 ENCOUNTER — Encounter: Payer: Self-pay | Admitting: Internal Medicine

## 2013-06-05 NOTE — Telephone Encounter (Signed)
Message copied by Christen Butter on Tue Jun 05, 2013 10:52 AM ------      Message from: Nyoka Cowden      Created: Tue Jun 05, 2013  5:50 AM       She canceled her chf clinic 12/22 and I needed her to keep it to be sure it was safe to stop ACEi and get her started on an alternative so let's check on her and get her reconnected asap or start her on avapro 75 mg daily if can't come to them or [place] this week.  ------

## 2013-06-05 NOTE — Telephone Encounter (Signed)
LMTCB

## 2013-06-11 ENCOUNTER — Ambulatory Visit: Payer: Medicare Other | Admitting: Physician Assistant

## 2013-06-11 NOTE — Telephone Encounter (Signed)
LMTCB for the pt 

## 2013-06-12 NOTE — Telephone Encounter (Signed)
Spoke with the pt  She she states that she has been monitoring her BP at home since off ACE and readings have been normal  She is still taking coreg 25 mg bid  She states that she plans to f/u with cards after the holidays  Does not want to start any new meds until sees cards  Will forward to MW so he is aware

## 2013-06-12 NOTE — Telephone Encounter (Signed)
LMTCB

## 2013-06-12 NOTE — Telephone Encounter (Signed)
No problem but need to let Dr Milas Kocher know pt declined the ARB and make sure she gets back to [facility]

## 2013-06-13 NOTE — Telephone Encounter (Signed)
We will see.  Chantel please make sure [name] has a f/u appointment in January. Thanks.

## 2013-06-18 ENCOUNTER — Telehealth: Payer: Self-pay | Admitting: *Deleted

## 2013-06-18 NOTE — Telephone Encounter (Signed)
Message copied by Cathi Roan on Mon Jun 18, 2013 12:40 PM ------      Message from: Rodena Piety      Created: Fri Jun 08, 2013  1:06 PM      Regarding: RE: if no show, please call for appt       I just finished talking with the patient and she wants to reschedule when Jeneen Rinks is here, but Jeneen Rinks isn't on the January calendar. I informed the patient to callback on Tuesday to speak with you about it. Patient also stated that she can't do Sunday.            I have three patients wanting to schedule sleep studies on Friday or Saturday and 1-2,1-3,1-9 and 1-30 are booked. Is it possible to open up 1-16 and 1-23?                  Ivin Booty      ----- Message -----         From: Jacklynn Bue, RPSGT         Sent: 06/08/2013  12:14 PM           To: [employer], Virl Diamond Plemmons, #      Subject: if no show, please call for appt                         I had to call this patient today to cancel her appointment tonight.  I have had the flu for 3 days now and I am still fevering.  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  She understood that I just don't want to get her sick.  I told her we will try to work with her as best we can to reschedule her as soon as possible.  I offered her 1/11 and 1/3 (both sundays) however, she says Sundays are difficult.  I told her that unless we had a cancellation, it was looking like the second week in January at this time.  She understands and knows we will do all we can.  I will look at my schedule Sunday and see if there's anywhere I can take care of her sooner.      Just wanted to let you all know.      Thank you!       ------

## 2013-06-18 NOTE — Telephone Encounter (Signed)
Called and LM for patient offering her available appointments of Wed. 06/20/12 at 8:00 PM, also several appointment times the following week.  None of these were on a Sunday, none were on Friday either unfortunately, but hopefully she can arrange her schedule to accept one of these upcoming available appointments.  Asked her to contact us and let us know if any of these dates will work for her. Reminded her this was to reschedule the appointment that had to be cancelled 06/08/13.

## 2013-07-01 ENCOUNTER — Other Ambulatory Visit: Payer: Self-pay | Admitting: Internal Medicine

## 2013-07-02 ENCOUNTER — Encounter (HOSPITAL_COMMUNITY): Payer: Self-pay | Admitting: *Deleted

## 2013-07-02 ENCOUNTER — Other Ambulatory Visit (HOSPITAL_COMMUNITY): Payer: Self-pay | Admitting: *Deleted

## 2013-07-02 MED ORDER — DIGOXIN 125 MCG PO TABS: 125.0000 ug | ORAL_TABLET | Freq: Every day | ORAL | Status: DC

## 2013-07-04 ENCOUNTER — Other Ambulatory Visit: Payer: Self-pay | Admitting: Physician Assistant

## 2013-07-04 DIAGNOSIS — F449 Dissociative and conversion disorder, unspecified: Secondary | ICD-10-CM | POA: Diagnosis not present

## 2013-07-04 DIAGNOSIS — F411 Generalized anxiety disorder: Secondary | ICD-10-CM | POA: Diagnosis not present

## 2013-07-04 DIAGNOSIS — R569 Unspecified convulsions: Secondary | ICD-10-CM | POA: Diagnosis not present

## 2013-07-04 DIAGNOSIS — I509 Heart failure, unspecified: Secondary | ICD-10-CM | POA: Diagnosis not present

## 2013-07-04 DIAGNOSIS — G8929 Other chronic pain: Secondary | ICD-10-CM | POA: Diagnosis not present

## 2013-07-04 DIAGNOSIS — R109 Unspecified abdominal pain: Secondary | ICD-10-CM

## 2013-07-04 DIAGNOSIS — R279 Unspecified lack of coordination: Secondary | ICD-10-CM | POA: Diagnosis not present

## 2013-07-10 ENCOUNTER — Telehealth: Payer: Self-pay | Admitting: Neurology

## 2013-07-10 NOTE — Telephone Encounter (Signed)
Spoke with mom scheduled appt w/ Dr Lurena Joiner is ready to sched sleep study as soon as possible

## 2013-07-10 NOTE — Telephone Encounter (Signed)
Patient's mom calling to state patient needs appointment with Dr. Brett Fairy as soon as possible. Patient's mom states patient has conversion disorder and cannot walk, seems to be getting worse. Patient's mom also said patient has trouble talking sometimes. Please call patient's mom back.

## 2013-07-11 ENCOUNTER — Ambulatory Visit
Admission: RE | Admit: 2013-07-11 | Discharge: 2013-07-11 | Disposition: A | Discharge status: Home / Self Care | Payer: Medicare Other | Source: Ambulatory Visit | Attending: Physician Assistant | Admitting: Physician Assistant

## 2013-07-11 DIAGNOSIS — K5900 Constipation, unspecified: Secondary | ICD-10-CM | POA: Diagnosis not present

## 2013-07-11 DIAGNOSIS — R109 Unspecified abdominal pain: Secondary | ICD-10-CM

## 2013-07-11 MED ORDER — IOHEXOL 300 MG/ML  SOLN
125.0000 mL | Freq: Once | INTRAMUSCULAR | Status: AC | PRN
  Administered 2013-07-11: 125 mL via INTRAVENOUS

## 2013-07-17 NOTE — Telephone Encounter (Signed)
Called and spoke to [name] today in order to reschedule her sleep study.  We would like to schedule her as a 1:1 patient if possible and her husband will accompany her on the night of her test and stay in the room with her.  They would like to get her testing done before 07/25/13 if possible, this is when she is meeting with Dr. Brett Fairy again. Unfortunately our first available is 08/09/13 with Shanon Brow.  I can't put her on my schedule until 08/26/13.  I reached out to Jeneen Rinks who works [employer] to see if he had any availability for Sat. Sun. Or Monday this weekend before her appointment, I will contact the patient tomorrow afternoon to let her know what I find out.

## 2013-07-18 NOTE — Telephone Encounter (Signed)
Called patient and let her know that I would open up my schedule and make arrangements to work Monday night 07/23/13 in order to make sure her study was completed prior to her appointment on 07/25/13.  Appt was added to the sleep schedule.  Study will be processed so it will be ready to read on Tues 2/10 prior to her 2/11 appt. -sh

## 2013-07-23 ENCOUNTER — Ambulatory Visit (INDEPENDENT_AMBULATORY_CARE_PROVIDER_SITE_OTHER): Payer: Medicare Other | Admitting: Neurology

## 2013-07-23 DIAGNOSIS — G4733 Obstructive sleep apnea (adult) (pediatric): Secondary | ICD-10-CM

## 2013-07-23 DIAGNOSIS — I420 Dilated cardiomyopathy: Secondary | ICD-10-CM

## 2013-07-23 DIAGNOSIS — G253 Myoclonus: Secondary | ICD-10-CM

## 2013-07-23 DIAGNOSIS — G620 Drug-induced polyneuropathy: Secondary | ICD-10-CM

## 2013-07-24 ENCOUNTER — Telehealth: Payer: Self-pay | Admitting: Neurology

## 2013-07-24 ENCOUNTER — Encounter: Payer: Self-pay | Admitting: *Deleted

## 2013-07-24 NOTE — Telephone Encounter (Signed)
I called and left a messge for the patient about her recent sleep study results. 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  Dr. Brett Fairy recommends avoiding supine sleep (sleep on her right side), PAP therapy which will mean another overnight study with CPAP and masking fitting or ENT evaluation. I will fax a copy of the report to Dr. Delene Ruffini office and mail a copy of the report to the patient.

## 2013-07-25 ENCOUNTER — Encounter: Payer: Self-pay | Admitting: Neurology

## 2013-07-25 ENCOUNTER — Ambulatory Visit (INDEPENDENT_AMBULATORY_CARE_PROVIDER_SITE_OTHER): Payer: Medicare Other | Admitting: Neurology

## 2013-07-25 VITALS — BP 102/66 | HR 92 | Resp 18 | Ht 63.0 in | Wt 254.0 lb

## 2013-07-25 DIAGNOSIS — C50919 Malignant neoplasm of unspecified site of unspecified female breast: Secondary | ICD-10-CM | POA: Diagnosis not present

## 2013-07-25 DIAGNOSIS — I509 Heart failure, unspecified: Secondary | ICD-10-CM

## 2013-07-25 DIAGNOSIS — R0902 Hypoxemia: Secondary | ICD-10-CM | POA: Diagnosis not present

## 2013-07-25 DIAGNOSIS — G7119 Other specified myotonic disorders: Secondary | ICD-10-CM | POA: Diagnosis not present

## 2013-07-25 DIAGNOSIS — R5381 Other malaise: Secondary | ICD-10-CM

## 2013-07-25 DIAGNOSIS — G723 Periodic paralysis: Secondary | ICD-10-CM | POA: Diagnosis not present

## 2013-07-25 DIAGNOSIS — R5383 Other fatigue: Secondary | ICD-10-CM

## 2013-07-25 MED ORDER — ALPRAZOLAM 0.5 MG PO TABS: 0.5000 mg | ORAL_TABLET | Freq: Every evening | ORAL | Status: DC | PRN

## 2013-07-25 NOTE — Sleep Study (Signed)
See media tab for full report  

## 2013-07-25 NOTE — Progress Notes (Signed)
[facility]  Provider:  Larey Seat, M D  Referring Provider: Kelton Pillar, MD Primary Care Physician:  REDMON,NOELLE, PA-C  Chief Complaint  Patient presents with  . Follow-up    Room 11  . Peripheral Neuropathy    MOCA 27/30    HPI:  [name] is a 43 y.o. female  Is seen here as a work in patient  , after recent sleep study from  9 07-23-13.  The patient underwent an expanded sleep study with full EEG and o2 monitoring.  CO2 was monitored for only 70 minutes , due to technical difficulties, no co2 retention was seen in the recorded time.  The study found an AHI of 6.2 and hypoxemia for a prolonged period of time.  The patient had no Video activity or seizure activity.    i am concerned about the ongoing dysphonia, and weakness, proximal  - and she reports waxing and waning,but is never free of malaise and fatigue and weakness.  She is dry in mouth and eyes, and will need to be evaluated for myopathy and paraneoplatic symptoms.  She had Hemiplegia without stroke on CT and MRI.  During today's evaluation I also noticed myoclonia, she has a hemiparesis seems to drift to the right side of her body is rather rigid she can squeeze but cannot release her hands and repeated activity does not make it easier to release her hands. Her mother does not have any of the symptoms, and she reports no exercise or food intake related myotonia, paratonia.      Last visit :  [name] is a well-established patient in our practice I have followed for over 6 years now. The patient developed breast cancer at a young age, she was diagnosed more or less coincidental after a mammogram was offered at her workplace.  She underwent surgery, chemotherapy and radiation. Late effects of her therapies included  a cardiomyopathy with reduced ejection fraction -for which he follows regularly with cardiology,  a neuropathy, and overall lethargy and malaise.  The patient never felt  quite as energetic or rested as she did prior to her cance diagnoses. She also developed diabetes and PICA.  She is eating ice cubes and starch, she tested negative for iron deficiency anemia.  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  She still speaks this way today, poorly modulated dysarthic speech today. Brain MRI in  07-2011 , before implanation of her defibrillator , was normal- no extensive WM disease, nor metastatis,  nor strokes.  EEG was normal in February 2013 ,but for benzodiazepine related artefact.  A polysomnography on 08-06-2011 evaluated her  for fatigue and EDS, and returned with normal results, her AHI was 0.5/hr.   She has a lot of fatigue she does not sleep well and her mother now reported that she has been jerks and jumps.  The way her mother describes it to me today is that [name] twitches in all 4 extremities at once- sometimes has ophistotonus, the whole body jerking with a pelvic thrust   , and sometimes the  movements are repeated so often that she is concerned about her daughter contracting injuries. The spells seem to star mild and increase in amplitude,  she is hitting furniture.  She has also had a tongue bite ,chewed her tongue at night which is frequently associated  with seizures.  She is completely amnestic for the events, and her mother reports these can go on for several minutes, 20- 30 minutes.  She remains asleep and does not wake up. At times she yells loudly in her sleep, amnestic for these spells , too.   She has noticed isolated myoclonic jerks in one hand or foot in daytime, mostly in her right , dominant hand.  She is just very tired afterwards. She has not had incontinence during these events.   Review of Systems:  I cannot rule out frontal lobe seizures , but no activity was seen on prolonged sleep  EEG and video.  The patient presents with delayed relaxation of hand muscles after grip testing.   History   Social History  . Marital Status: Married    Spouse Name: [name]    Number of Children: 1  . Years of Education: 16   Occupational History  . Not on file.   Social History Main Topics  . Smoking status: Never Smoker   . Smokeless tobacco: Never Used  . Alcohol Use: Yes     Comment: 11/03/11 "maybe once a month"  . Drug Use: No  . Sexual Activity: Not on file   Other Topics Concern  . Not on file   Social History Narrative   Patient is married [name]) and lives at home with her family.   Patient has one child.   Patient is right-handed.   Patient has a college education.   Patient drinks some caffeine occasionally, but not everyday.   Regular exercise    Family History  Problem Relation Age of Onset  . Coronary artery disease    . Heart attack    . Heart disease Mother   . Heart disease Maternal Uncle   . Cancer Paternal Uncle     colon  . Heart disease Maternal Grandmother   . Cancer Paternal Grandmother     ovarian    Past Medical History  Diagnosis Date  . 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 Cardiac MRI 05/2011 EF 32%. ICD implantation 10/2011 ([employer])  . Peripheral neuropathy     chemo- induced  . Hypertension     c/b orthostatic hypotention  . Depression   . Palpitation     normal sinus rhythm only on 21 day heart monitor  . Obesity   . Fibromyalgia   . GERD (gastroesophageal reflux disease)   . Heart murmur   . Breast calcifications     right breast  . Interstitial cystitis   . Anxiety   . Nonischemic cardiomyopathy     related to chemo; EF 30% 05/2011  . ICD (implantable cardiac defibrillator) in place   . Pneumonia 2000's    "once"  . Type II diabetes mellitus   . Blood transfusion 1980's    1987 or 1988  . Anemia 1980's     [id number]  . History of stomach ulcers   . Breast cancer     right;  . Conversion disorder   . 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   . 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 Laparoscopic endometriosis fulguration  1990's  . Cardiac defibrillator placement  11/03/11  . Tonsillectomy  1980's  .  Tubal ligation  1990's  . Vaginal hysterectomy  2000's  . Cholecystectomy  ~ 2009  . Breast lumpectomy  2008; 2013    right  . Port-a-cath removal  2010?    left chest; placed in 2008  . Nasal sinus surgery      Current Outpatient Prescriptions  Medication Sig Dispense Refill  . albuterol (PROAIR HFA) 108 (90 BASE) MCG/ACT inhaler Inhale 2 puffs into the lungs every 6 (six) hours as needed for wheezing.  1 Inhaler  3  . B Complex-C (B-COMPLEX WITH VITAMIN C) tablet Take 1 tablet by mouth daily.      . beclomethasone (QVAR) 80 MCG/ACT inhaler Inhale 2 puffs into the lungs 2 (two) times daily.  1 Inhaler  2  . beta carotene w/minerals (OCUVITE) tablet Take 1 tablet by mouth daily.      . carvedilol (COREG) 25 MG tablet Take 1 tablet (25 mg total) by mouth 2 (two) times daily with a meal.  60 tablet  6  . Cholecalciferol (VITAMIN D3) 5000 UNITS CAPS Take 1 capsule by mouth daily.       . Coenzyme Q10 (CO Q-10) 100 MG CAPS Take 100 mg by mouth daily.      . Cyanocobalamin 2500 MCG SUBL Place 2,500 mcg under the tongue daily.      . digoxin (LANOXIN) 0.125 MG tablet Take 1 tablet (125 mcg total) by mouth daily. MUST HAVE FOLLOW UP APPOINTMENT FOR FURTHER REFILLS  30 tablet  1  . divalproex (DEPAKOTE ER) 250 MG 24 hr tablet Take 1 tablet (250 mg total) by mouth daily.  90 tablet  3  . GLUCOSAMINE-CHONDROITIN PO Take 1,200 mg by mouth daily.      Marland Kitchen LORazepam (ATIVAN) 1 MG tablet Take 1 mg by mouth 2 (two)  times daily as needed. For anxiety      . methocarbamol (ROBAXIN) 750 MG tablet Take 750 mg by mouth 2 (two) times daily.       . metolazone (ZAROXOLYN) 2.5 MG tablet Take 1 tablet (2.5 mg total) by mouth as directed.  5 tablet  1  . Multiple Vitamin (MULTIVITAMIN WITH MINERALS) TABS Take 1 tablet by mouth daily.      Marland Kitchen oxyCODONE-acetaminophen (PERCOCET) 10-325 MG per tablet Take 1 tablet by mouth every 4 (four) hours as needed. For pain      . PATADAY 0.2 % SOLN       . pentosan polysulfate (ELMIRON) 100 MG capsule Take 100 mg by mouth 2 (two) times daily.      . potassium chloride SA (K-DUR,KLOR-CON) 20 MEQ tablet Take 20 mEq by mouth daily. With fluid pill      . pregabalin (LYRICA) 200 MG capsule Take 600 mg by mouth at bedtime.       Marland Kitchen PRODIGY NO CODING BLOOD GLUC test strip       . RABEprazole (ACIPHEX) 20 MG tablet Take 20 mg by mouth daily.        Marland Kitchen spironolactone (ALDACTONE) 25 MG tablet Take 0.5 tablets (12.5 mg total) by mouth daily.  15 tablet  6  . torsemide (DEMADEX) 20 MG tablet FOR FLUID RETENTION TO BE TAKEN WITH POTASSIUM  60 tablet  2  . zolpidem (AMBIEN CR) 12.5 MG CR tablet Take 12.5 mg by mouth.       Marland Kitchen lisinopril (PRINIVIL,ZESTRIL) 10 MG tablet Take 10 mg by mouth daily.       No current facility-administered medications for this visit.  Allergies as of 07/25/2013 - Review Complete 07/25/2013  Allergen Reaction Noted  . Other Rash 10/28/2011  . Reglan [metoclopramide hcl] Anxiety 07/25/2008    Vitals: BP 102/66  Pulse 92  Resp 18  Ht 5\' 3"  (1.6 m)  Wt 254 lb (115.214 kg)  BMI 45.01 kg/m2 Last Weight:  Wt Readings from Last 1 Encounters:  07/25/13 254 lb (115.214 kg)   Last Height:   Ht Readings from Last 1 Encounters:  07/25/13 5\' 3"  (1.6 m)    Physical exam:  General: The patient is awake, alert and appears not in acute distress. The patient is well groomed. Head: Normocephalic, atraumatic. Neck is supple. Mallampati 3, neck circumference: 15  inches . Please note that there was no tongue bite mark.  Cardiovascular:  Regular rate and rhythm , without  murmurs or carotid bruit, and without distended neck veins. EF was reduced to 28% .  Respiratory: Lungs are clear to auscultation. Skin:  Without evidence of edema, or rash Trunk: BMI is  elevated .  Neurologic exam : The patient is awake and alert, oriented to place and time.  Memory subjective described as impaired . There is a normal attention span & concentration ability.  Speech is non- fluent with poorly modulated -speech ,  dysarthria, dysphonia , aphasia. Mood and affect are  anxious, pleading .  Cranial nerves: Pupils are equal and briskly reactive to light. Funduscopic exam without  evidence of pallor or edema. Extraocular movements  in both planes intact and without nystagmus.  Visual fields by finger perimetry are intact. Hearing to finger rub intact.  Facial sensation intact to fine touch. Facial motor strength is symmetric and tongue and uvula move midline.  Motor exam:    Delayed grip release, prolonged over repeated attempts to perform grip strength  Fist , than release the grip with greater delay. She drifts to the right. She is stiff. There was reported excessive startle freezing response.  Sensory:  Fine touch, pinprick and vibration were reduced in hands and feet,  Proprioception affected , too.    Gait and station: Patient walks without assistive device but is walking very slowly and carefully.  Stance is stable but wide based . She needs help to step up to the stool.  Tandem gait is fragmented and had to be deferred.  Romberg testing positive retropulsion.  Deep tendon reflexes: in the  upper and lower extremities are brisk, symmetric and intact. Babinski maneuver downgoing.   Assessment:  After physical and neurologic examination, review of laboratory studies, imaging, neurophysiology testing and pre-existing records, assessment is   Myotonia delayed grip  release, there is no warm up phenomenon.  No known  family  history of myotonia congenita, and  no percussion myotonia. She had right sided weakness spells , drifting, she reports at times doing the opposite of what she intended, for example she walked backwards, believed she stepped forwards.  .  She reports excessive startle responses, freezing her.  No food intake or exercise or cold is triggering the attacks. She is stiff every day , just never "normal" as she puts it ,  Has dysphonia and a slowed, strained speech , neck stiffness . No eyelid myotonia. No percussion myotonia.  Patients mother places her hand on the patients neck and this will release her neck stiffness, dystonia?    Patient referred for EMG to evaluate myotonia, please examine the thenar eminence.  She was recently found again to have low K levels and started on  a supplement besides spironolactone.   Patient has anxiety and depression and has been diagnosed with a conversion disorders.  She no longer responds to Lorazepam, but XANAX, she advised .

## 2013-07-26 LAB — VGCC ANTIBODY: VGCC Antibody: NEGATIVE

## 2013-07-28 LAB — PARANEOPLASTIC PROFILE 1
Neuronal Nuc Ab (Ri), IFA: <1:10 {titer}
Purkinje Cell (Yo) Autoantobodies- IFA: <1:10 {titer}

## 2013-07-30 ENCOUNTER — Encounter: Payer: Medicare Other | Admitting: Radiology

## 2013-07-31 ENCOUNTER — Other Ambulatory Visit (HOSPITAL_COMMUNITY): Payer: Self-pay | Admitting: *Deleted

## 2013-07-31 MED ORDER — CARVEDILOL 25 MG PO TABS: 25.0000 mg | ORAL_TABLET | Freq: Two times a day (BID) | ORAL | Status: DC

## 2013-08-02 ENCOUNTER — Ambulatory Visit (INDEPENDENT_AMBULATORY_CARE_PROVIDER_SITE_OTHER): Payer: Medicare Other | Admitting: Neurology

## 2013-08-02 ENCOUNTER — Encounter (INDEPENDENT_AMBULATORY_CARE_PROVIDER_SITE_OTHER): Payer: Self-pay

## 2013-08-02 ENCOUNTER — Telehealth: Payer: Self-pay | Admitting: Neurology

## 2013-08-02 DIAGNOSIS — C50919 Malignant neoplasm of unspecified site of unspecified female breast: Secondary | ICD-10-CM

## 2013-08-02 DIAGNOSIS — G723 Periodic paralysis: Secondary | ICD-10-CM

## 2013-08-02 DIAGNOSIS — M629 Disorder of muscle, unspecified: Secondary | ICD-10-CM | POA: Diagnosis not present

## 2013-08-02 DIAGNOSIS — M2420 Disorder of ligament, unspecified site: Secondary | ICD-10-CM | POA: Diagnosis not present

## 2013-08-02 DIAGNOSIS — R0902 Hypoxemia: Secondary | ICD-10-CM

## 2013-08-02 DIAGNOSIS — G7119 Other specified myotonic disorders: Secondary | ICD-10-CM

## 2013-08-02 DIAGNOSIS — M6289 Other specified disorders of muscle: Secondary | ICD-10-CM

## 2013-08-02 DIAGNOSIS — Z0289 Encounter for other administrative examinations: Secondary | ICD-10-CM

## 2013-08-02 NOTE — Telephone Encounter (Signed)
Pt wants her Dr. Here to be Dr. Krista Blue not Dr. Jannifer Franklin as they were her choices per the note. She also is waiting for a follow up of a referral that was suppose to be sent to [facility] and was sent to [facility] Please call.

## 2013-08-02 NOTE — Procedures (Signed)
     HISTORY:  [name] is a 43 year old patient with a history of a conversion disorder who is currently complaining of stiffness of the arms and legs, difficulty with grip. The patient is being evaluated for a possible myotonic disorder.  NERVE CONDUCTION STUDIES:  Nerve conduction studies were performed on right upper extremity. The distal motor latencies and motor amplitudes for the median and ulnar nerves were within normal limits. The F wave latencies and nerve conduction velocities for these nerves were also normal. The sensory latencies for the median and ulnar nerves were normal.  Nerve conduction studies were performed on right lower extremity. The distal motor latencies and motor amplitudes for the peroneal and posterior tibial nerves were within normal limits. The nerve conduction velocities for these nerves were also normal. The sensory latencies for the peroneal nerves were within normal limits.  A repetitive nerve stimulation study was performed with hook up to the right APB muscle with stimulation of the right median nerve. The muscle was fatigued for 1 minute, and 3 Hz stimulation with a train of 10 stimulations was performed at 15 seconds, 30 seconds, 1 minute, 2 minutes, and 4 minutes following muscle fatigue. The baseline was stable. A decremental response was not seen. One train of 40 Hz stimulation was done as well. An incremental response was not seen. Again, the baseline was stable.   EMG STUDIES:  EMG study was performed on the right upper extremity:  The first dorsal interosseous muscle reveals 2 to 4 K units with full recruitment. No fibrillations or positive waves were noted. The abductor pollicis brevis muscle reveals 2 to 4 K units with full recruitment. No fibrillations or positive waves were noted. The extensor indicis proprius muscle reveals 1 to 3 K units with full recruitment. No fibrillations or positive waves were noted. The pronator teres muscle  reveals 2 to 3 K units with full recruitment. No fibrillations or positive waves were noted. The biceps muscle reveals 1 to 2 K units with full recruitment. No fibrillations or positive waves were noted. The triceps muscle reveals 2 to 4 K units with full recruitment. No fibrillations or positive waves were noted. The anterior deltoid muscle reveals 2 to 3 K units with full recruitment. No fibrillations or positive waves were noted. The cervical paraspinal muscles were tested at 2 levels. No abnormalities of insertional activity were seen at either level tested. There was good relaxation.   IMPRESSION:  Nerve conduction studies done on the right upper and right lower extremities were within normal limits. No evidence of a neuropathy is seen. EMG evaluation of the right upper extremity was unremarkable, no evidence of a myopathic disorder or a myotonic disorder is seen. There is no evidence of a right cervical radiculopathy.  A repetitive nerve simulation study was done using 3 Hz and at 40 Hz stimulation. These evaluations did not show evidence of a neuromuscular transmission disorder. These studies were unremarkable.  Jill Alexanders MD 08/02/2013 4:08 PM  [facility] [address] [place], [place] [address]  Phone [phone] Fax [phone]

## 2013-08-06 NOTE — Telephone Encounter (Signed)
Left message with patient to call and set up an appointment with Dr. Krista Blue.

## 2013-08-06 NOTE — Progress Notes (Signed)
Quick Note:  Left message that lab was negative for paraneoplastic syndrome, per Dr. Brett Fairy. ______

## 2013-08-07 ENCOUNTER — Encounter (HOSPITAL_COMMUNITY): Payer: Medicare Other

## 2013-08-13 ENCOUNTER — Encounter (HOSPITAL_COMMUNITY): Payer: Medicare Other

## 2013-08-13 ENCOUNTER — Ambulatory Visit (HOSPITAL_COMMUNITY): Admission: RE | Admit: 2013-08-13 | Payer: Medicare Other | Source: Ambulatory Visit

## 2013-08-14 ENCOUNTER — Telehealth: Payer: Self-pay | Admitting: Neurology

## 2013-08-14 NOTE — Telephone Encounter (Signed)
Called patient to inform her that her referral was sent to [facility]. On 08/02/13. I gave the patient the number to [facility] to inquire about getting an appt. I advised the patient that if she has any other problems, questions or concerns to call the office. Patient verbalized understanding.

## 2013-08-14 NOTE — Telephone Encounter (Signed)
Patient's mother calling to state that they are still waiting on a referral to [facility] since patient is getting worse. Please call patient and advise.

## 2013-08-15 NOTE — Progress Notes (Signed)
Quick Note:  Spoke to patient and relayed EMG/NCV results, per Dr. Brett Fairy. She is going to see the neuromuscular specialist at [facility]. ______

## 2013-08-15 NOTE — Telephone Encounter (Signed)
I spoke to patient and relayed if she has a long wait to get in to see the specialist at [facility], Dr. Brett Fairy would like her to see Dr. Krista Blue.  In fact Dr. Brett Fairy wanted her to come and see Dr. Krista Blue.   She will get back to [place] once she finds out when her appointment is at [facility].

## 2013-08-16 NOTE — Telephone Encounter (Signed)
Please see phone note 08-14-13.

## 2013-08-27 ENCOUNTER — Ambulatory Visit (INDEPENDENT_AMBULATORY_CARE_PROVIDER_SITE_OTHER): Payer: Medicare Other | Admitting: *Deleted

## 2013-08-27 DIAGNOSIS — I429 Cardiomyopathy, unspecified: Secondary | ICD-10-CM | POA: Diagnosis not present

## 2013-08-27 DIAGNOSIS — I5022 Chronic systolic (congestive) heart failure: Secondary | ICD-10-CM | POA: Diagnosis not present

## 2013-08-28 ENCOUNTER — Other Ambulatory Visit: Payer: Self-pay | Admitting: Internal Medicine

## 2013-08-28 ENCOUNTER — Other Ambulatory Visit: Payer: Self-pay

## 2013-08-28 LAB — MDC_IDC_ENUM_SESS_TYPE_INCLINIC
Brady Statistic RV Percent Paced: 0.1 %
HighPow Impedance: 51 Ohm
Lead Channel Impedance Value: 399 Ohm
Lead Channel Pacing Threshold Amplitude: 1.125 V
Lead Channel Pacing Threshold Pulse Width: 0.4 ms
MDC IDC MSMT BATTERY VOLTAGE: 3.19 V
MDC IDC MSMT LEADCHNL RV SENSING INTR AMPL: 16.5 mV
MDC IDC SET LEADCHNL RV PACING AMPLITUDE: 2 V
MDC IDC SET LEADCHNL RV PACING PULSEWIDTH: 0.4 ms
MDC IDC SET LEADCHNL RV SENSING SENSITIVITY: 0.3 mV
MDC IDC SET ZONE DETECTION INTERVAL: 400 ms
Zone Setting Detection Interval: 250 ms
Zone Setting Detection Interval: 300 ms

## 2013-08-28 MED ORDER — DIGOXIN 125 MCG PO TABS: 125.0000 ug | ORAL_TABLET | Freq: Every day | ORAL | Status: DC

## 2013-09-03 ENCOUNTER — Encounter (HOSPITAL_COMMUNITY): Payer: Self-pay

## 2013-09-03 ENCOUNTER — Ambulatory Visit (HOSPITAL_COMMUNITY)
Admission: RE | Admit: 2013-09-03 | Discharge: 2013-09-03 | Disposition: A | Discharge status: Home / Self Care | Payer: Medicare Other | Source: Ambulatory Visit | Attending: Internal Medicine | Admitting: Internal Medicine

## 2013-09-03 ENCOUNTER — Other Ambulatory Visit (HOSPITAL_COMMUNITY): Payer: Self-pay | Admitting: Vascular Surgery

## 2013-09-03 ENCOUNTER — Other Ambulatory Visit (HOSPITAL_COMMUNITY): Payer: Self-pay | Admitting: Internal Medicine

## 2013-09-03 ENCOUNTER — Ambulatory Visit (HOSPITAL_BASED_OUTPATIENT_CLINIC_OR_DEPARTMENT_OTHER)
Admission: RE | Admit: 2013-09-03 | Discharge: 2013-09-03 | Disposition: A | Discharge status: Home / Self Care | Payer: Medicare Other | Source: Ambulatory Visit | Attending: Internal Medicine | Admitting: Internal Medicine

## 2013-09-03 VITALS — BP 92/64 | HR 90 | Wt 256.8 lb

## 2013-09-03 DIAGNOSIS — R079 Chest pain, unspecified: Secondary | ICD-10-CM

## 2013-09-03 DIAGNOSIS — I509 Heart failure, unspecified: Secondary | ICD-10-CM

## 2013-09-03 DIAGNOSIS — I5021 Acute systolic (congestive) heart failure: Secondary | ICD-10-CM | POA: Diagnosis not present

## 2013-09-03 DIAGNOSIS — R05 Cough: Secondary | ICD-10-CM

## 2013-09-03 DIAGNOSIS — I5022 Chronic systolic (congestive) heart failure: Secondary | ICD-10-CM | POA: Diagnosis not present

## 2013-09-03 DIAGNOSIS — R635 Abnormal weight gain: Secondary | ICD-10-CM

## 2013-09-03 DIAGNOSIS — F09 Unspecified mental disorder due to known physiological condition: Secondary | ICD-10-CM

## 2013-09-03 DIAGNOSIS — I059 Rheumatic mitral valve disease, unspecified: Secondary | ICD-10-CM | POA: Diagnosis not present

## 2013-09-03 DIAGNOSIS — R059 Cough, unspecified: Secondary | ICD-10-CM

## 2013-09-03 DIAGNOSIS — F449 Dissociative and conversion disorder, unspecified: Secondary | ICD-10-CM | POA: Insufficient documentation

## 2013-09-03 DIAGNOSIS — F0789 Other personality and behavioral disorders due to known physiological condition: Secondary | ICD-10-CM

## 2013-09-03 LAB — BASIC METABOLIC PANEL
BUN: 12 mg/dL (ref 6–23)
CO2: 28 mEq/L (ref 19–32)
Calcium: 9.2 mg/dL (ref 8.4–10.5)
Chloride: 99 mEq/L (ref 96–112)
Creatinine, Ser: 0.86 mg/dL (ref 0.50–1.10)
GFR, EST NON AFRICAN AMERICAN: 82 mL/min — AB (ref >90)
GLUCOSE: 111 mg/dL — AB (ref 70–99)
POTASSIUM: 3.3 meq/L — AB (ref 3.7–5.3)
SODIUM: 140 meq/L (ref 137–147)

## 2013-09-03 LAB — PRO B NATRIURETIC PEPTIDE: Pro B Natriuretic peptide (BNP): 998.7 pg/mL — ABNORMAL HIGH (ref 0–125)

## 2013-09-03 LAB — DIGOXIN LEVEL: Digoxin Level: 0.7 ng/mL — ABNORMAL LOW (ref 0.8–2.0)

## 2013-09-03 MED ORDER — TORSEMIDE 20 MG PO TABS: 20.0000 mg | ORAL_TABLET | Freq: Every day | ORAL | Status: DC

## 2013-09-03 NOTE — Patient Instructions (Signed)
Take Torsemide 40 mg (2 tabs) daily for 4 days then 20 mg (1 tab) daily Take Potassium 20 meq (1 tab) daily  Labs today  Your physician recommends that you schedule a follow-up appointment in: 1 week

## 2013-09-03 NOTE — Progress Notes (Signed)
Patient ID: [name], female   DOB: 04/16/1971, 43 y.o.   MRN: [id number]  History of Present Illness: Primary Cardiologist:  Dr. Glori Roy Neurlogist: [name]   PCP: Dr. Margaretha Sheffield Roy/[name], PAC  [name] is a 43 y.o. female with a history of morbid obesity, depression, fibromyalgia, congestive heart failure secondary to nonischemic cardiomyopathy related to her chemotherapy for breast cancer. Last chemo 2008. Still in remission.   CPX Feb 2010. Peak VO2 was 14.5 which was 71% of predicted. When corrected for body weight the VO2 was 20.7. The slope was 27. RER 1.20. O2 pulse 73%. Overall this was felt to be only a very mild functional limitation due to her obesity and mild circulatory limitation.  CPX 2/11: pVO2 13.0 (72%) correct for ideal wt 97ml/kg/min RER 1.06 (submax) slope 28.2 O2 pulse 91% - felt no signifcant cardiac limitation. + deconditioning.   Echo 04/2009  EF back down to 25%.  RHC normal. Echo 07/2009 showed EF 50%  Echo 06/2010 EF 40%. So we did MUGA EF 58% Echo 12/2010 40-45% Echo 05/2011 EF 30%. Grade 2 diastolic dysfunction Cath (12/28/10) was set up and demonstrated EF 20-25% and normal coronary arteries.  RA  4, RV 35/11, PA  28/8 (17), PCWP 8. Fick 5.2/2.4 PVR 1.7 Woods. Fick 5.2/2.4 Aortic saturation was 97%.  PA saturation was 67% and 68%.   Cardiac MRI on 07/19/11: Moderate to severe LVE, Diffuse hypokinesis. EF 32%, Mild LAE  Patient has Medtronic ICD.   Evaluated by neuro for stuttering.  Felt to have a conversion disorder.  She returns for followup after almost a year.  She is significantly limited by exertional dyspnea.  She is short of breath walking around her house or up any incline.  She has orthopnea and bendopnea.  No chest pain or tachypalpitations.  She has not been taking torsemide (uses very infrequently).  I reviewed her echo done today.  EF 20-25% with moderate LV dilation and restrictive diastolic function.  RV mildly dilated with mildly  decreased systolic function, moderate MR and TR. Dr [name] cut back on her lisinopril because of low blood pressure, and pulmonary cut it out entirely due to concern that it was causing her to be short of breath.   Optivol today reviewed personally: Fluid index markedly elevated.  Impedance trending down.  Labs (12/14):      Past Medical History  Diagnosis Date  . 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 Cardiac MRI 05/2011 EF 32%. ICD implantation 10/2011 (Medtronic)  . Peripheral neuropathy     chemo- induced  . Hypertension     c/b orthostatic hypotention  . Depression   . Palpitation     normal sinus rhythm only on 21 day heart monitor  . Obesity   . Fibromyalgia   . GERD (gastroesophageal reflux disease)   . Heart murmur   . Breast calcifications     right breast  . Interstitial cystitis   . Anxiety   . Nonischemic cardiomyopathy     related to chemo; EF 30% 05/2011  . ICD (implantable cardiac defibrillator) in place   . Pneumonia 2000's    "once"  . Type II diabetes mellitus   . Blood transfusion 1980's    1987 or 1988  . Anemia 1980's    [id number]  . History of stomach ulcers   . Breast cancer     right;  .  Conversion disorder   . 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   . 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 albuterol (PROAIR HFA) 108 (90 BASE) MCG/ACT inhaler Inhale 2 puffs into the lungs every 6 (six) hours as needed for wheezing.  1 Inhaler  3  . ALPRAZolam (XANAX) 0.5 MG tablet Take 1 tablet (0.5 mg total) by mouth at bedtime as needed for anxiety.  60 tablet  0  . B Complex-C (B-COMPLEX WITH VITAMIN C) tablet Take 1 tablet by mouth  daily.      . beclomethasone (QVAR) 80 MCG/ACT inhaler Inhale 2 puffs into the lungs 2 (two) times daily.  1 Inhaler  2  . beta carotene w/minerals (OCUVITE) tablet Take 1 tablet by mouth daily.      . carvedilol (COREG) 25 MG tablet Take 1 tablet (25 mg total) by mouth 2 (two) times daily with a meal. MUST HAVE FOLLOW UP APPOINTMENT FOR FURTHER REFILLS  60 tablet  1  . Cholecalciferol (VITAMIN D3) 5000 UNITS CAPS Take 1 capsule by mouth daily.       . Coenzyme Q10 (CO Q-10) 100 MG CAPS Take 100 mg by mouth daily.      . Cyanocobalamin 2500 MCG SUBL Place 2,500 mcg under the tongue daily.      . digoxin (LANOXIN) 0.125 MG tablet Take 1 tablet (125 mcg total) by mouth daily.  30 tablet  1  . divalproex (DEPAKOTE ER) 250 MG 24 hr tablet Take 1 tablet (250 mg total) by mouth daily.  90 tablet  3  . GLUCOSAMINE-CHONDROITIN PO Take 1,200 mg by mouth daily.      . methocarbamol (ROBAXIN) 750 MG tablet Take 750 mg by mouth 2 (two) times daily.       . metolazone (ZAROXOLYN) 2.5 MG tablet Take 1 tablet (2.5 mg total) by mouth as directed.  5 tablet  1  . Multiple Vitamin (MULTIVITAMIN WITH MINERALS) TABS Take 1 tablet by mouth daily.      Marland Kitchen oxyCODONE-acetaminophen (PERCOCET) 10-325 MG per tablet Take 1 tablet by mouth every 4 (four) hours as needed. For pain      . PATADAY 0.2 % SOLN       . pentosan polysulfate (ELMIRON) 100 MG capsule Take 100 mg by mouth 2 (two) times daily.      . potassium chloride SA (K-DUR,KLOR-CON) 20 MEQ tablet Take 20 mEq by mouth daily. With fluid pill      . pregabalin (LYRICA) 200 MG capsule Take 600 mg by mouth at bedtime.       Marland Kitchen PRODIGY NO CODING BLOOD GLUC test strip       . RABEprazole (ACIPHEX) 20 MG tablet Take 20 mg by mouth daily.        Marland Kitchen spironolactone (ALDACTONE) 25 MG tablet Take 0.5 tablets (12.5 mg total) by mouth daily.  15 tablet  6  . zolpidem (AMBIEN CR) 12.5 MG CR tablet Take 12.5 mg by mouth.        No current facility-administered medications on file  prior to encounter.     Allergies  Allergen Reactions  . Other Rash    Chloraprep  . Reglan [Metoclopramide Hcl] Anxiety   ROS: All pertinent positives or negatives as in HPI otherwise negative   Vital Signs: Filed Vitals:  09/03/13 1228  BP: 92/64  Pulse: 90  Weight: 256 lb 12.8 oz (116.484 kg)  SpO2: 95%   PHYSICAL EXAM: Well nourished, well developed, in no acute distress HEENT: normal Neck: JVP 12 cm Cardiac:  Lateral PMI, normal S1, S2; RRR; no murmur, No S3 Lungs:  clear to auscultation bilaterally, no wheezing, rhonchi or rales Abd: obese, soft, nontender, +distension Ext: 1+ ankle edema.   Skin: warm and dry Neuro:  CNs 2-12 intact, no focal abnormalities noted Psych: Normal affect  ASSESSMENT AND PLAN: 1. Chronic systolic CHF: Nonischemic cardiomyopathy.  I reviewed today's echo, EF remains low at 20-25% with moderately dilated LV.  She is very volume overloaded today with NYHA class IIIb symptoms.  She has not been taking torsemide.  - Take torsemide 40 mg daily x 4 days, then down to 20 mg daily.  - Start KCl 20 mEq daily - Continue current Coreg, digoxin, spironolactone.  - Start losartan at low dose, 12.5 mg daily.  - BMET/BNP/digoxin level today.  - Followup in 1 week with BMET.  2. Conversion disorder: Followed by neurology.  3. Morbid obesity: Needs to work at diet/exercise for weight loss.   Loralie Champagne 09/03/2013

## 2013-09-03 NOTE — Progress Notes (Signed)
Echocardiogram 2D Echocardiogram has been performed.  [name] 09/03/2013, 11:37 AM

## 2013-09-10 ENCOUNTER — Other Ambulatory Visit: Payer: Self-pay | Admitting: Neurology

## 2013-09-10 NOTE — Telephone Encounter (Signed)
Rx signed and faxed.

## 2013-09-10 NOTE — Progress Notes (Signed)
ICD remote with ICM 

## 2013-09-11 ENCOUNTER — Telehealth: Payer: Self-pay

## 2013-09-11 ENCOUNTER — Telehealth (HOSPITAL_COMMUNITY): Payer: Self-pay | Admitting: Adult Health

## 2013-09-11 ENCOUNTER — Ambulatory Visit (HOSPITAL_COMMUNITY)
Admission: RE | Admit: 2013-09-11 | Discharge: 2013-09-11 | Disposition: A | Discharge status: Home / Self Care | Payer: Medicare Other | Source: Ambulatory Visit | Attending: Internal Medicine | Admitting: Internal Medicine

## 2013-09-11 VITALS — BP 100/70 | HR 111 | Wt 240.0 lb

## 2013-09-11 DIAGNOSIS — Z6841 Body Mass Index (BMI) 40.0 and over, adult: Secondary | ICD-10-CM | POA: Insufficient documentation

## 2013-09-11 DIAGNOSIS — I509 Heart failure, unspecified: Secondary | ICD-10-CM

## 2013-09-11 DIAGNOSIS — I5022 Chronic systolic (congestive) heart failure: Secondary | ICD-10-CM | POA: Insufficient documentation

## 2013-09-11 LAB — BASIC METABOLIC PANEL
BUN: 17 mg/dL (ref 6–23)
CALCIUM: 9.3 mg/dL (ref 8.4–10.5)
CO2: 21 meq/L (ref 19–32)
Chloride: 98 mEq/L (ref 96–112)
Creatinine, Ser: 0.93 mg/dL (ref 0.50–1.10)
GFR calc Af Amer: 86 mL/min — ABNORMAL LOW (ref >90)
GFR, EST NON AFRICAN AMERICAN: 74 mL/min — AB (ref >90)
Glucose, Bld: 98 mg/dL (ref 70–99)
Potassium: 6.2 mEq/L — ABNORMAL HIGH (ref 3.7–5.3)
SODIUM: 138 meq/L (ref 137–147)

## 2013-09-11 NOTE — Progress Notes (Signed)
Patient ID: [name], female   DOB: 05-Nov-1970, 43 y.o.   MRN: [id number] Primary Cardiologist:  Dr. Arvilla Meres Neurlogist: Dohmeir   PCP: Dr. Consuella Lose Griffin/Noel Redmon, PAC   History of Present Illness: [name] is a 43 y.o. female with a history of morbid obesity, depression, fibromyalgia, congestive heart failure secondary to nonischemic cardiomyopathy related to her chemotherapy for breast cancer. Last chemo 2008. Evaluated by neuro for stuttering.  Felt to have a conversion disorder.  She returns for follow up. Last visit torsemide was increased to 40 mg bid for 4 days then back to torsemide 20 mg daily. She was restarted on losartan 12.5 mg daily. Overall she is feeling better. Weight at home trending down at home to 240 pounds. Not exercising. Tries to follow low salt diet. Taking all medications.   CPX Feb 2010. Peak VO2 was 14.5 which was 71% of predicted. When corrected for body weight the VO2 was 20.7. The slope was 27. RER 1.20. O2 pulse 73%. Overall this was felt to be only a very mild functional limitation due to her obesity and mild circulatory limitation.  CPX 2/11: pVO2 13.0 (72%) correct for ideal wt 58ml/kg/min RER 1.06 (submax) slope 28.2 O2 pulse 91% - felt no signifcant cardiac limitation. + deconditioning.   Echo 04/2009  EF back down to 25%.  RHC normal. Echo 07/2009 showed EF 50%  Echo 06/2010 EF 40%. So we did MUGA EF 58% Echo 12/2010 40-45% Echo 05/2011 EF 30%. Grade 2 diastolic dysfunction Cath (12/28/10) was set up and demonstrated EF 20-25% and normal coronary arteries.  RA  4, RV 35/11, PA  28/8 (17), PCWP 8. Fick 5.2/2.4 PVR 1.7 Woods. Fick 5.2/2.4 Aortic saturation was 97%.  PA saturation was 67% and 68%. ECHO 09/03/13 EF 20-25%   Cardiac MRI on 07/19/11: Moderate to severe LVE, Diffuse hypokinesis. EF 32%, Mild LAE  Optivol today reviewed personally: Fluid index well below threshold. Activity < 1 hour per day.    Labs 09/03/13 k 3.3 Creatinine 0.86 Pro  BNP 998 Dig level 0.7   Past Medical History  Diagnosis Date  . 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 Cardiac MRI 05/2011 EF 32%. ICD implantation 10/2011 (Medtronic)  . Peripheral neuropathy     chemo- induced  . Hypertension     c/b orthostatic hypotention  . Depression   . Palpitation     normal sinus rhythm only on 21 day heart monitor  . Obesity   . Fibromyalgia   . GERD (gastroesophageal reflux disease)   . Heart murmur   . Breast calcifications     right breast  . Interstitial cystitis   . Anxiety   . Nonischemic cardiomyopathy     related to chemo; EF 30% 05/2011  . ICD (implantable cardiac defibrillator) in place   . Pneumonia 2000's    "once"  . Type II diabetes mellitus   . Blood transfusion 1980's    1987 or 1988  . Anemia 1980's    [id number]  . History of stomach ulcers   . Breast cancer     right;  . Conversion disorder   . 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   . 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 albuterol (PROAIR HFA) 108 (90 BASE) MCG/ACT inhaler Inhale 2 puffs into the lungs every 6 (six) hours as needed for wheezing.  1 Inhaler  3  . ALPRAZolam (XANAX) 0.5 MG tablet TAKE ONE TABLET BY MOUTH AT BEDTIME AS NEEDED FOR ANXIETY  90 tablet  1  . B Complex-C (B-COMPLEX WITH VITAMIN C) tablet Take 1 tablet by mouth daily.      . beclomethasone (QVAR) 80 MCG/ACT inhaler Inhale 2 puffs into the lungs 2 (two) times daily.  1 Inhaler  2  . beta carotene w/minerals (OCUVITE) tablet Take 1 tablet by mouth daily.      . carvedilol (COREG) 25 MG tablet Take 1 tablet (25 mg total) by mouth 2 (two) times daily with a meal. MUST HAVE  FOLLOW UP APPOINTMENT FOR FURTHER REFILLS  60 tablet  1  . Cholecalciferol (VITAMIN D3) 5000 UNITS CAPS Take 1 capsule by mouth daily.       . Coenzyme Q10 (CO Q-10) 100 MG CAPS Take 100 mg by mouth daily.      . Cyanocobalamin 2500 MCG SUBL Place 2,500 mcg under the tongue daily.      . digoxin (LANOXIN) 0.125 MG tablet Take 1 tablet (125 mcg total) by mouth daily.  30 tablet  1  . divalproex (DEPAKOTE ER) 250 MG 24 hr tablet Take 1 tablet (250 mg total) by mouth daily.  90 tablet  3  . GLUCOSAMINE-CHONDROITIN PO Take 1,200 mg by mouth daily.      . methocarbamol (ROBAXIN) 750 MG tablet Take 750 mg by mouth 2 (two) times daily.       . metolazone (ZAROXOLYN) 2.5 MG tablet Take 1 tablet (2.5 mg total) by mouth as directed.  5 tablet  1  . Multiple Vitamin (MULTIVITAMIN WITH MINERALS) TABS Take 1 tablet by mouth daily.      Marland Kitchen oxyCODONE-acetaminophen (PERCOCET) 10-325 MG per tablet Take 1 tablet by mouth every 4 (four) hours as needed. For pain      . PATADAY 0.2 % SOLN       . pentosan polysulfate (ELMIRON) 100 MG capsule Take 100 mg by mouth 2 (two) times daily.      . potassium chloride SA (K-DUR,KLOR-CON) 20 MEQ tablet Take 20 mEq by mouth daily. With fluid pill      . pregabalin (LYRICA) 200 MG capsule Take 600 mg by mouth at bedtime.       Marland Kitchen PRODIGY NO CODING BLOOD GLUC test strip       . RABEprazole (ACIPHEX) 20 MG tablet Take 20 mg by mouth daily.        Marland Kitchen spironolactone (ALDACTONE) 25 MG tablet Take 0.5 tablets (12.5 mg total) by mouth daily.  15 tablet  6  . torsemide (DEMADEX) 20 MG tablet Take 1 tablet (20 mg total) by mouth daily.  60 tablet  2  . zolpidem (AMBIEN CR) 12.5 MG CR tablet Take 12.5 mg by mouth.        No current facility-administered medications on file prior to encounter.     Allergies  Allergen Reactions  . Other Rash    Chloraprep  . Reglan [Metoclopramide Hcl] Anxiety   ROS: All pertinent positives or negatives as in HPI otherwise negative   Vital  Signs: Filed Vitals:   09/11/13 1151  BP: 100/70  Pulse: 111  Weight: 240 lb (108.863 kg)  SpO2: 97%   PHYSICAL EXAM: Well nourished, well developed, in no acute distress  Mom present  HEENT: normal Neck: JVP 5-6 Cardiac:  Lateral PMI, normal S1, S2; RRR; no murmur, No S3 Lungs:  clear to auscultation bilaterally, no wheezing, rhonchi or rales Abd: obese, soft, nontender, nondistended Ext: No lower extremity edema.    Skin: warm and dry Neuro:  CNs 2-12 intact, no focal abnormalities noted Psych: Normal affect  ASSESSMENT AND PLAN: 1. Chronic systolic CHF: Nonischemic cardiomyopathy.  Medtronic ICD. EF 08/3013 20-25% with moderately dilated LV.  NYHA IIIB. Volume status improved. And this coincides with optivol which is well below threshold wit activity < 1 hour per day. Continue torsemide 20 mg daily.  -  Continue  KCl 20 mEq daily - Continue current Coreg, digoxin, spironolactone and low dose losartan. Dig level ok.  -Check BMET . Check CPX and compare to most recent in 2011. ? If dyspnea/ fatigue is from  deconditioning. May need RHC to assess hemodynamics. ? Low output.  She declined cardiac rehab.   2. Conversion disorder: Followed by neurology.  3. Morbid obesity: Needs to work at diet/exercise for weight loss.   Follow up in 1 month to discuss CPX results.   Jocilynn Grade 09/11/2013

## 2013-09-11 NOTE — Patient Instructions (Signed)
Follow up in 4 weeks to discuss CPX results    Do the following things EVERYDAY: 1) Weigh yourself in the morning before breakfast. Write it down and keep it in a log. 2) Take your medicines as prescribed 3) Eat low salt foods-Limit salt (sodium) to 2000 mg per day.  4) Stay as active as you can everyday 5) Limit all fluids for the day to less than 2 liters

## 2013-09-11 NOTE — Telephone Encounter (Signed)
Called patient asking her to call office back for appt.

## 2013-09-11 NOTE — Telephone Encounter (Signed)
Pt aware Pt refused to have labs re drawn 09/11/13 Pt states she will have labs re drawn 09/12/13 @ [facility]

## 2013-09-11 NOTE — Telephone Encounter (Signed)
Left message to call HF clinic back regarding lab work.   K 6.2 with hemolysis  She will need to repeat potassium level today.   CLEGG,AMY NP-C 2:00 PM

## 2013-09-11 NOTE — Telephone Encounter (Signed)
Left message to return call to HF clinic.   [name] 3:40 PM

## 2013-09-11 NOTE — Telephone Encounter (Signed)
Left message to call HF clinic    Alayia Meggison 4:11 PM

## 2013-09-11 NOTE — Addendum Note (Signed)
Addended by: Kerry Dory on: 09/11/2013 04:27 PM   Modules accepted: Orders

## 2013-09-12 ENCOUNTER — Other Ambulatory Visit: Payer: Medicare Other

## 2013-09-12 ENCOUNTER — Encounter: Payer: Self-pay | Admitting: *Deleted

## 2013-09-12 DIAGNOSIS — I509 Heart failure, unspecified: Secondary | ICD-10-CM

## 2013-09-17 ENCOUNTER — Encounter: Payer: Self-pay | Admitting: Cardiology

## 2013-09-18 ENCOUNTER — Telehealth (HOSPITAL_COMMUNITY): Payer: Self-pay | Admitting: Surgery

## 2013-09-18 NOTE — Telephone Encounter (Signed)
I called [name] to let her know that her labs had not yet resulted and that per Deatra Canter Cosgrove--NP she needed to have labs redrawn tomorrow.  She agreed to have labs redrawn tomorrow at [facility].

## 2013-09-19 ENCOUNTER — Other Ambulatory Visit: Payer: Medicare Other

## 2013-09-19 NOTE — Telephone Encounter (Signed)
Pt called to inform office she WILL NOT be able to keep lab appt scheduled today Pt will c/b when she is ready to r/s

## 2013-09-20 ENCOUNTER — Encounter: Payer: Self-pay | Admitting: Internal Medicine

## 2013-09-20 ENCOUNTER — Encounter (HOSPITAL_COMMUNITY): Payer: Medicare Other

## 2013-09-23 ENCOUNTER — Other Ambulatory Visit (HOSPITAL_COMMUNITY): Payer: Self-pay | Admitting: Internal Medicine

## 2013-10-03 ENCOUNTER — Encounter: Payer: Self-pay | Admitting: Internal Medicine

## 2013-10-09 ENCOUNTER — Ambulatory Visit (HOSPITAL_COMMUNITY)
Admission: RE | Admit: 2013-10-09 | Discharge: 2013-10-09 | Disposition: A | Discharge status: Home / Self Care | Payer: Medicare Other | Source: Ambulatory Visit | Attending: Internal Medicine | Admitting: Internal Medicine

## 2013-10-09 DIAGNOSIS — I5022 Chronic systolic (congestive) heart failure: Secondary | ICD-10-CM

## 2013-10-11 ENCOUNTER — Encounter (HOSPITAL_COMMUNITY): Payer: Medicare Other

## 2013-10-12 ENCOUNTER — Encounter (HOSPITAL_COMMUNITY): Payer: Self-pay | Admitting: *Deleted

## 2013-10-12 NOTE — Progress Notes (Signed)
Heart Failure Diagnostics Follow Up Form - Med[employer]    Fluid Index        ___   Fluid index trending with baseline       X__   Fluid index trending up but below threshold, very slight elevation       ___   Fluid index above threshold       ___   More than 3 threshold crossings this year        Current diuretic dose: Marland Kitchen                                                                                                  Baseline weight: .                                      Current Weight: .                                     Patient Activity        Current activity level: .      1 hour daily                                                    Baseline activity level: .     About 30 min daily                                                     AT/AF Burden        ___  Increase in AT/AF burden       ___  New onset of AT/AF       _X__  No AT/AF         ICD Therapies        ___ 1 or more ICD shocks    Interventions       ___  Adjust medications: .                                                                                                   ___  Labs: .  _X_  Need for follow up appointment: .  Pt cancelled appt that was sch for this week, letter mailed to pt regarding optivol results and need to sch f/u appt        _X__  HF diagnostics follow up: .   June 1st                                                                                       ___  Review with EP: Marland Kitchen                                                                                                         Patient notified: Date: .   10/12/13                             By: .       Phone   . X      Letter

## 2013-10-29 ENCOUNTER — Encounter: Payer: Self-pay | Admitting: Internal Medicine

## 2013-11-13 ENCOUNTER — Ambulatory Visit (HOSPITAL_COMMUNITY)
Admission: RE | Admit: 2013-11-13 | Discharge: 2013-11-13 | Disposition: A | Discharge status: Home / Self Care | Payer: Medicare Other | Source: Ambulatory Visit | Attending: Internal Medicine | Admitting: Internal Medicine

## 2013-11-13 ENCOUNTER — Other Ambulatory Visit: Payer: Self-pay | Admitting: Pulmonary Disease

## 2013-11-13 ENCOUNTER — Other Ambulatory Visit (HOSPITAL_COMMUNITY): Payer: Self-pay | Admitting: Adult Health

## 2013-11-13 DIAGNOSIS — I5022 Chronic systolic (congestive) heart failure: Secondary | ICD-10-CM | POA: Diagnosis not present

## 2013-11-13 NOTE — Progress Notes (Signed)
Heart Failure Diagnostics Follow Up Form - [employer]    Fluid Index        ___   Fluid index trending with baseline       ___   Fluid index trending up but below threshold       _X__   Fluid index above threshold       ___   More than 3 threshold crossings this year        Current diuretic dose: .  Torsemide 20 mg daily, Spiro 12.5 daily, met 2.5 mg prn                                                                                                Baseline weight: .                                      Current Weight: .    Has not been able to weigh                                 Patient Activity        Current activity level: .   30 min daily                                                       Baseline activity level: .    Between 30 min to 1 hour                                                      AT/AF Burden        ___  Increase in AT/AF burden       ___  New onset of AT/AF       _X__  No AT/AF         ICD Therapies        ___ 1 or more ICD shocks    Interventions       _X__  Adjust medications: had length conversation with pt's mother, she states pt has really gone down hill and is not really even able to get up out of bed due to her convulsion disorder, she is sch to see a specialist at [facility] on 6/4.  Pt's mother does state pt has not been taking her Torsemide b/c she only takes it if her weight is up and pt has not be able to weigh.  Advised pt needs to start taking Torsemide 20 mg daily and needs to take Metolazone today and tomorrow to help with excess fluid.  Pt's mom does state pt has edema and seems SOB at times, she is unable  to bring pt to appt as she is so deconditioned advised when pt is able we need to see her back for f/u, she will call and arrange when able, will recheck optivol in 2 weeks      ___  Labs: .                                                                                                                          _X__  Need for follow up  appointment: .    Unable to come due to convulsion disorder and deconditioning                                                                            _X__  HF diagnostics follow up: .   11/26/13 and 12/17/13                                                                                       ___  Review with EP: Marland Kitchen                                                                                                         Patient notified: Date: .  11/13/13                              By: .  Rhunette Croft     Phone   .       Letter

## 2013-11-15 DIAGNOSIS — R569 Unspecified convulsions: Secondary | ICD-10-CM | POA: Diagnosis not present

## 2013-11-15 DIAGNOSIS — F449 Dissociative and conversion disorder, unspecified: Secondary | ICD-10-CM | POA: Diagnosis not present

## 2013-11-15 DIAGNOSIS — M2420 Disorder of ligament, unspecified site: Secondary | ICD-10-CM | POA: Diagnosis not present

## 2013-11-15 DIAGNOSIS — M629 Disorder of muscle, unspecified: Secondary | ICD-10-CM | POA: Diagnosis not present

## 2013-11-21 ENCOUNTER — Encounter: Payer: Self-pay | Admitting: Internal Medicine

## 2013-11-25 ENCOUNTER — Telehealth: Payer: Self-pay | Admitting: Physician Assistant

## 2013-11-25 DIAGNOSIS — I429 Cardiomyopathy, unspecified: Secondary | ICD-10-CM

## 2013-11-25 MED ORDER — DIGOXIN 125 MCG PO TABS: 125.0000 ug | ORAL_TABLET | Freq: Every day | ORAL | Status: DC

## 2013-11-25 NOTE — Telephone Encounter (Signed)
[name] is a 43 y.o. female with a hx of NICM.  She is running out of her Digoxin and needs a refill.   This was sent to her pharmacy with one refill only as it looks like she needs a follow up appt. Richardson Dopp, PA-C   11/25/2013 12:11 PM

## 2013-11-26 ENCOUNTER — Other Ambulatory Visit (HOSPITAL_COMMUNITY): Payer: Self-pay | Admitting: Cardiology

## 2013-11-26 NOTE — Telephone Encounter (Signed)
Opened in error This was addressed 11/25/13

## 2013-11-27 ENCOUNTER — Encounter (HOSPITAL_COMMUNITY): Payer: Self-pay | Admitting: *Deleted

## 2013-11-27 ENCOUNTER — Ambulatory Visit (HOSPITAL_COMMUNITY)
Admission: RE | Admit: 2013-11-27 | Discharge: 2013-11-27 | Disposition: A | Discharge status: Home / Self Care | Payer: Medicare Other | Source: Ambulatory Visit | Attending: Internal Medicine | Admitting: Internal Medicine

## 2013-11-27 DIAGNOSIS — I5022 Chronic systolic (congestive) heart failure: Secondary | ICD-10-CM | POA: Diagnosis not present

## 2013-11-27 NOTE — Progress Notes (Signed)
Heart Failure Diagnostics Follow Up Form - [employer]    Fluid Index        _X__   Fluid index trending with baseline       ___   Fluid index trending up but below threshold       ___   Fluid index above threshold       ___   More than 3 threshold crossings this year        Current diuretic dose: Marland Kitchen                                                                                                  Baseline weight: .                                      Current Weight: .                                     Patient Activity        Current activity level: .       1 hour daily                                                   Baseline activity level: .   About 30 min to 1 hour daily                                                       AT/AF Burden        ___  Increase in AT/AF burden       ___  New onset of AT/AF       _X__  No AT/AF         ICD Therapies        ___ 1 or more ICD shocks    Interventions       ___  Adjust medications: .                                                                                                   ___  Labs: .  ___  Need for follow up appointment: .                                                                                _X__  HF diagnostics follow up: .  July 7th                                                                                        ___  Review with EP: Marland Kitchen                                                                                                         Patient notified: Date: .11/27/13                                By: .       Phone   .  X     Letter

## 2013-11-28 ENCOUNTER — Telehealth: Payer: Self-pay | Admitting: Cardiology

## 2013-11-28 ENCOUNTER — Ambulatory Visit (INDEPENDENT_AMBULATORY_CARE_PROVIDER_SITE_OTHER): Payer: Medicare Other | Admitting: *Deleted

## 2013-11-28 DIAGNOSIS — I429 Cardiomyopathy, unspecified: Secondary | ICD-10-CM

## 2013-11-28 DIAGNOSIS — F09 Unspecified mental disorder due to known physiological condition: Secondary | ICD-10-CM | POA: Diagnosis not present

## 2013-11-28 DIAGNOSIS — F0789 Other personality and behavioral disorders due to known physiological condition: Secondary | ICD-10-CM

## 2013-11-28 DIAGNOSIS — I5022 Chronic systolic (congestive) heart failure: Secondary | ICD-10-CM | POA: Diagnosis not present

## 2013-11-28 DIAGNOSIS — F070 Personality change due to known physiological condition: Secondary | ICD-10-CM | POA: Diagnosis not present

## 2013-11-28 LAB — MDC_IDC_ENUM_SESS_TYPE_REMOTE
Battery Voltage: 3.16 V
HighPow Impedance: 62 Ohm
Lead Channel Impedance Value: 361 Ohm
Lead Channel Pacing Threshold Amplitude: 1.125 V
Lead Channel Pacing Threshold Pulse Width: 0.4 ms
Lead Channel Sensing Intrinsic Amplitude: 17.3 mV
Lead Channel Setting Pacing Amplitude: 2 V
Lead Channel Setting Pacing Pulse Width: 0.4 ms
Lead Channel Setting Sensing Sensitivity: 0.3 mV
MDC IDC SET ZONE DETECTION INTERVAL: 250 ms
MDC IDC SET ZONE DETECTION INTERVAL: 300 ms
MDC IDC STAT BRADY RV PERCENT PACED: 0 %
Zone Setting Detection Interval: 400 ms

## 2013-11-28 NOTE — Progress Notes (Signed)
Remote ICD transmission.   

## 2013-11-28 NOTE — Telephone Encounter (Signed)
Spoke with pt and reminded pt of remote transmission that is due today. Pt verbalized understanding.   

## 2013-11-29 ENCOUNTER — Encounter: Payer: Self-pay | Admitting: Internal Medicine

## 2013-12-05 ENCOUNTER — Other Ambulatory Visit: Payer: Self-pay | Admitting: Pulmonary Disease

## 2013-12-05 ENCOUNTER — Other Ambulatory Visit (HOSPITAL_COMMUNITY): Payer: Self-pay | Admitting: Internal Medicine

## 2013-12-10 ENCOUNTER — Other Ambulatory Visit (HOSPITAL_COMMUNITY): Payer: Self-pay | Admitting: *Deleted

## 2013-12-10 MED ORDER — CARVEDILOL 25 MG PO TABS: ORAL_TABLET | ORAL | Status: DC

## 2013-12-18 ENCOUNTER — Encounter: Payer: Self-pay | Admitting: Cardiology

## 2013-12-18 ENCOUNTER — Telehealth (HOSPITAL_COMMUNITY): Payer: Medicare Other

## 2013-12-19 ENCOUNTER — Other Ambulatory Visit: Payer: Self-pay | Admitting: Neurology

## 2013-12-21 DIAGNOSIS — J329 Chronic sinusitis, unspecified: Secondary | ICD-10-CM | POA: Diagnosis not present

## 2013-12-21 DIAGNOSIS — J309 Allergic rhinitis, unspecified: Secondary | ICD-10-CM | POA: Diagnosis not present

## 2013-12-26 ENCOUNTER — Encounter: Payer: Self-pay | Admitting: Internal Medicine

## 2014-01-03 ENCOUNTER — Ambulatory Visit (INDEPENDENT_AMBULATORY_CARE_PROVIDER_SITE_OTHER)
Admission: RE | Admit: 2014-01-03 | Discharge: 2014-01-03 | Disposition: A | Discharge status: Home / Self Care | Payer: Medicare Other | Source: Ambulatory Visit | Attending: Pulmonary Disease | Admitting: Pulmonary Disease

## 2014-01-03 ENCOUNTER — Ambulatory Visit: Payer: Medicare Other | Admitting: Pulmonary Disease

## 2014-01-03 ENCOUNTER — Encounter: Payer: Self-pay | Admitting: Pulmonary Disease

## 2014-01-03 VITALS — BP 102/68 | HR 96 | Temp 97.8°F | Ht 67.0 in | Wt 239.8 lb

## 2014-01-03 DIAGNOSIS — R0600 Dyspnea, unspecified: Secondary | ICD-10-CM

## 2014-01-03 DIAGNOSIS — G4733 Obstructive sleep apnea (adult) (pediatric): Secondary | ICD-10-CM

## 2014-01-03 DIAGNOSIS — R0989 Other specified symptoms and signs involving the circulatory and respiratory systems: Secondary | ICD-10-CM

## 2014-01-03 DIAGNOSIS — R05 Cough: Secondary | ICD-10-CM | POA: Diagnosis not present

## 2014-01-03 DIAGNOSIS — J329 Chronic sinusitis, unspecified: Secondary | ICD-10-CM

## 2014-01-03 DIAGNOSIS — J45998 Other asthma: Secondary | ICD-10-CM

## 2014-01-03 DIAGNOSIS — R059 Cough, unspecified: Secondary | ICD-10-CM | POA: Diagnosis not present

## 2014-01-03 DIAGNOSIS — R0609 Other forms of dyspnea: Secondary | ICD-10-CM

## 2014-01-03 MED ORDER — MOMETASONE FURO-FORMOTEROL FUM 100-5 MCG/ACT IN AERO: 2.0000 | INHALATION_SPRAY | Freq: Two times a day (BID) | RESPIRATORY_TRACT | Status: DC

## 2014-01-03 MED ORDER — MONTELUKAST SODIUM 10 MG PO TABS: 10.0000 mg | ORAL_TABLET | Freq: Every day | ORAL | Status: DC

## 2014-01-03 NOTE — Progress Notes (Signed)
Chief Complaint  Patient presents with  . Acute Visit    Pt c/o DOE, mild prod cough with brown and light green mucous, PND and CP with rest and activity. Pt also states she has epigastric pressure that at times migrates to her chest.     History of Present Illness: [name] is a 43 y.o. female never smoker with dyspnea from combined heart failure, asthma, and deconditioning.  She has hx of breast cancer in 2008 with chemotherapy induced cardiomyopathy.  She remains short of breath with minimal activity.  Her breathing is shallow and she feels tight in her chest.  She is getting a cough with thick, clear to brown/green sputum.  She gets wheezing sometimes >> this improves when she uses her albuterol.  Her voice quality is weak.  She uses Qvar bid, and proair frequently.  She has notice more trouble with her sinuses and allergies.  She is using OTC anti-histamine prn.  She has been seen by Dr. Janace Hoard with ENT, and was prescribed nasonex.   TESTS: Lt heart cath 12/24/10 >> normal coronaries, EF 20 to 25%, normal filling pressures  PSG 08/06/11 >> AHI 0.5, SpO2 low 85%, Spent 19 min with SpO2 < 90%  PFT 08/29/12 >> FEV1 1.67 (60%), FEV1% 74, TLC 3.66 (66%), DLCO 84%, no BD Echo 09/03/13 >> EF 20 to 25%, diffuse hypokinesis, mod MR, mod LA dilation, mod TR, PAS 48 mmHg  Past medical hx, Past surgical hx, Medications, Allergies, Family hx, Social hx all reviewed.   Physical Exam:  General - No distress, in wheel chair ENT - No sinus tenderness, no oral exudate, no LAN, weak voice quality Cardiac - s1s2 regular, no murmur Chest - No wheeze/rales/dullness Back - No focal tenderness Abd - Soft, non-tender Ext - No edema Neuro - Normal strength Skin - No rashes Psych - normal mood, and behavior   Assessment/Plan:  Chesley Mires, MD  Pulmonary/Critical Care/Sleep Pager:  [phone]

## 2014-01-03 NOTE — Patient Instructions (Signed)
Dulera two puffs twice per day, and rinse mouth after each use Montelukast (singulair) 10 mg pill nightly Proair two puffs up to four times per day as needed for cough, wheeze, or chest congestion Stop Qvar while using Dulera Chest xray today Follow up in 2 weeks with Dr. Halford Chessman or Tammy Parrett

## 2014-01-06 ENCOUNTER — Other Ambulatory Visit: Payer: Self-pay

## 2014-01-06 DIAGNOSIS — I429 Cardiomyopathy, unspecified: Secondary | ICD-10-CM

## 2014-01-06 MED ORDER — DIGOXIN 125 MCG PO TABS: 125.0000 ug | ORAL_TABLET | Freq: Every day | ORAL | Status: DC

## 2014-01-07 ENCOUNTER — Telehealth: Payer: Self-pay | Admitting: Pulmonary Disease

## 2014-01-07 DIAGNOSIS — R0600 Dyspnea, unspecified: Secondary | ICD-10-CM | POA: Insufficient documentation

## 2014-01-07 DIAGNOSIS — G4733 Obstructive sleep apnea (adult) (pediatric): Secondary | ICD-10-CM | POA: Insufficient documentation

## 2014-01-07 DIAGNOSIS — J329 Chronic sinusitis, unspecified: Secondary | ICD-10-CM | POA: Insufficient documentation

## 2014-01-07 NOTE — Assessment & Plan Note (Signed)
She has been seen by Dr. Janace Hoard with ENT.  She is to continue nasonex, and OTC antihistamines.  She is to continue nasal irrigation.  Singulair should help with this also.

## 2014-01-07 NOTE — Assessment & Plan Note (Signed)
Current complaints could be related to asthma.  Will adjust her asthma regimen.  Will also get chest xray today.  She is followed in heart failure clinic for combined heart failure >> I don't think she is in acute heart failure at present.  Deconditioning likely playing a significant role as well.

## 2014-01-07 NOTE — Telephone Encounter (Signed)
Dg Chest 2 View  01/03/2014   CLINICAL DATA:  Productive cough  EXAM: CHEST  2 VIEW  COMPARISON:  07/11/2013 and 11/15/2012  FINDINGS: Cardiomegaly again noted. There is hazy right infrahilar atelectasis or infiltrate. No pulmonary edema. Bony thorax is unremarkable. Single lead cardiac pacemaker is unchanged in position.  IMPRESSION: Hazy right infrahilar atelectasis or infiltrate. No pulmonary edema. Cardiomegaly again noted.   Electronically Signed   By: Lahoma Crocker M.D.   On: 01/03/2014 16:38    Will have my nurse inform pt that CXR shows atelectasis in Rt lung >> this means that part of lung is squished down.  This is very commonly seen when having more trouble with control of asthma.  She should continue with medications added at last visit.  She will need repeat chest xray at next visit to monitor clearing of this, but no other changes to treatment plan at this time.

## 2014-01-07 NOTE — Assessment & Plan Note (Signed)
Sleep study from 07/24/13 showed mild sleep apnea.  Defer follow up to Dr. Brett Fairy.

## 2014-01-09 NOTE — Telephone Encounter (Signed)
lmtcb x1 

## 2014-01-09 NOTE — Telephone Encounter (Signed)
Results have been explained to patient, pt expressed understanding.  Pt advised that this can be explained to her in more detail at upcoming OV with TP 8/12 and also a cxr. Nothing further needed.

## 2014-01-11 NOTE — Telephone Encounter (Signed)
Patient has never called back, has a [facility] balance as well, must address before scheduling

## 2014-01-17 ENCOUNTER — Telehealth: Payer: Self-pay | Admitting: Pulmonary Disease

## 2014-01-17 MED ORDER — MOMETASONE FURO-FORMOTEROL FUM 100-5 MCG/ACT IN AERO: 2.0000 | INHALATION_SPRAY | Freq: Two times a day (BID) | RESPIRATORY_TRACT | Status: DC

## 2014-01-17 NOTE — Telephone Encounter (Signed)
Spoke with the pt  She is requesting refill on [facility]  Rx was sent to pharm  Nothing further needed

## 2014-01-20 ENCOUNTER — Other Ambulatory Visit: Payer: Self-pay | Admitting: Internal Medicine

## 2014-01-22 ENCOUNTER — Ambulatory Visit (HOSPITAL_COMMUNITY)
Admission: RE | Admit: 2014-01-22 | Discharge: 2014-01-22 | Disposition: A | Discharge status: Home / Self Care | Payer: Medicare Other | Source: Ambulatory Visit | Attending: Internal Medicine | Admitting: Internal Medicine

## 2014-01-23 ENCOUNTER — Ambulatory Visit: Payer: Medicare Other | Admitting: Pulmonary Disease

## 2014-01-23 ENCOUNTER — Encounter: Payer: Self-pay | Admitting: Pulmonary Disease

## 2014-01-23 ENCOUNTER — Ambulatory Visit: Payer: Medicare Other | Admitting: Adult Health

## 2014-01-23 VITALS — BP 110/78 | HR 85 | Temp 96.8°F | Ht 67.0 in | Wt 237.0 lb

## 2014-01-23 DIAGNOSIS — F449 Dissociative and conversion disorder, unspecified: Secondary | ICD-10-CM

## 2014-01-23 DIAGNOSIS — I5022 Chronic systolic (congestive) heart failure: Secondary | ICD-10-CM

## 2014-01-23 DIAGNOSIS — R0600 Dyspnea, unspecified: Secondary | ICD-10-CM

## 2014-01-23 DIAGNOSIS — R0602 Shortness of breath: Secondary | ICD-10-CM

## 2014-01-23 NOTE — Progress Notes (Signed)
Subjective:    Patient ID: [name], female    DOB: 05/24/1971, 43 y.o.   MRN: [id number]  HPI  43 y.o. female never smoker with dyspnea from combined heart failure, 'asthma', and deconditioning. She has hx of breast cancer in 2008 with chemotherapy induced cardiomyopathy. She also has father myalgia and conversion disorder. She last saw psychiatrist 3 years ago. On her last visit, she was given dulera instead of Qvar. This has not helped her much. This is also very expensive. Mother accompanies her today. She remains short of breath with minimal activity.she reports a dry cough.She gets wheezing sometimes >> this improves when she uses her albuterol. Her voice quality is weak and her speech is halting.  She is concerned about the finding of atelectasis on her chest x-ray report. Review shows that, this has been present on prior chest x-ray too. I wonder if this is related to breast shadow.  She does give a history of prior breast surgery. She has been seen by Dr. Janace Hoard with ENT, and was prescribed nasonex. Spirometry again shows moderate restriction with a ratio 73, FVC of 62%.   Chief Complaint  Patient presents with  . Follow-up    Pt states she was suppose to see TP today and was late and was rescheduled to RA's schedule. Pt states her breathing is unchanged. Pt was started on Dulera and stated she can't tell a difference from the qvar. Pt c/o prod cough with yellow, green and clear mucous. C/o DOE and "achy chest pain" with rest and activity.    Past Medical History  Diagnosis Date  . 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 Cardiac MRI 05/2011 EF 32%. ICD implantation 10/2011 ([employer]tr[employer])  . Peripheral neuropathy     chemo- induced  . Hypertension     c/b orthostatic hypotention  . Depression   . Palpitation     normal sinus rhythm only on 21 day heart monitor  . Obesity     . Fibromyalgia   . GERD (gastroesophageal reflux disease)   . Heart murmur   . Breast calcifications     right breast  . Interstitial cystitis   . Anxiety   . Nonischemic cardiomyopathy     related to chemo; EF 30% 05/2011  . ICD (implantable cardiac defibrillator) in place   . Pneumonia 2000's    "once"  . Type II diabetes mellitus   . Blood transfusion 1980's    1987 or 1988  . Anemia 1980's    [id number]  . History of stomach ulcers   . Breast cancer     right;  . Conversion disorder   . 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   . 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    TESTS:  Lt heart cath 12/24/10 >> normal coronaries, EF 20 to 25%, normal filling pressures  PSG 08/06/11 >> AHI 0.5, SpO2 low 85%, Spent 19 min with SpO2 < 90%  PFT 08/29/12 >> FEV1 1.67 (60%), FEV1% 74, TLC 3.66 (66%), DLCO 84%, no BD  Echo 09/03/13 >> EF 20 to 25%, diffuse hypokinesis, mod MR, mod LA dilation, mod TR, PAS 48 mmHg   Review of Systems neg for any significant sore throat, dysphagia, itching, sneezing, nasal congestion or excess/ purulent secretions, fever, chills, sweats, unintended  wt loss, pleuritic or exertional cp, hempoptysis, orthopnea pnd or change in chronic leg swelling. Also denies presyncope, palpitations, heartburn, abdominal pain, nausea, vomiting, diarrhea or change in bowel or urinary habits, dysuria,hematuria, rash, arthralgias, visual complaints, headache, numbness weakness or ataxia.     Objective:   Physical Exam  Gen. Pleasant, obese, in no distress, anxious affect ENT - no lesions, no post nasal drip, class 2-3 airway Neck: No JVD, no thyromegaly, no carotid bruits Lungs: no use of accessory muscles, no dullness to percussion, decreased without rales or rhonchi  Cardiovascular: Rhythm regular, heart sounds  normal, no murmurs or gallops, 2+  peripheral edema Abdomen: soft and non-tender, no hepatosplenomegaly, BS normal. Musculoskeletal: No deformities, no cyanosis or clubbing Neuro:  alert, non focal, no tremors, halting speech        Assessment & Plan:

## 2014-01-23 NOTE — Assessment & Plan Note (Signed)
multifactorial-combined heart failure, 'asthma', and deconditioning.   I do not feel there is any evidence of asthma here, no airway obstruction allergy symptomatic. As such I have asked her to stop dulera. She can go back on Qvar and albuterol as needed. She is concerned about the finding of atelectasis on her chest x-ray report. Review shows that, this has been present on prior chest x-ray too. I wonder if this is related to breast shadow.  She does give a history of prior breast surgery. I doubt that the CT scan is necessary here.

## 2014-01-23 NOTE — Assessment & Plan Note (Signed)
She is awaiting an appointment with neuro psych at [facility]. She has multiple psychosomatic complaints, and I wonder if this may not be the main issue here.

## 2014-01-23 NOTE — Patient Instructions (Addendum)
OK to stop dulera Take albuterol as needed only Make sure you take your fluid pills Get back with dr Haroldine Laws Breathing test -same as before

## 2014-01-23 NOTE — Assessment & Plan Note (Signed)
She does appear to have significant pedal edema. She will continue her diuretics, and make an appointment with the CHF team

## 2014-02-02 ENCOUNTER — Other Ambulatory Visit: Payer: Self-pay | Admitting: Internal Medicine

## 2014-02-12 NOTE — Progress Notes (Signed)
Patient ID: [name], female   DOB: [birth year], 43 y.o.   MRN: [id number] Primary Cardiologist:  Dr. Glori Bickers Neurlogist: Dohmeir   PCP: Dr. Margaretha Sheffield Griffin/Noel Redmon, PAC   History of Present Illness: [name] is a 43 y.o. female with a history of morbid obesity, depression, fibromyalgia, congestive heart failure secondary to nonischemic cardiomyopathy related to her chemotherapy for breast cancer. Last chemo 2008. Evaluated by neuro for stuttering.  Felt to have a conversion disorder.  She returns for follow up. Last visit K was 6.2 and she refused follow up lab work. Evaluated by Neurology at [facility] in June and they confirmed conversion disorder. She continues to struggle with conversion disorder.  Evaluated by pulmonary August 12 and Dr Elsworth Soho told he saw no evidence of asthma. Over the weekend she took metolazone and had torsemide with good results. Wants to start walking. Taking all medications. Unable to weigh due to balance issues.     CPX Feb 2010. Peak VO2 was 14.5 which was 71% of predicted. When corrected for body weight the VO2 was 20.7. The slope was 27. RER 1.20. O2 pulse 73%. Overall this was felt to be only a very mild functional limitation due to her obesity and mild circulatory limitation.  CPX 2/11: pVO2 13.0 (72%) correct for ideal wt 94ml/kg/min RER 1.06 (submax) slope 28.2 O2 pulse 91% - felt no signifcant cardiac limitation. + deconditioning.   Echo 04/2009  EF back down to 25%.  RHC normal. Echo 07/2009 showed EF 50%  Echo 06/2010 EF 40%. So we did MUGA EF 58% Echo 12/2010 40-45% Echo 05/2011 EF 30%. Grade 2 diastolic dysfunction Cath (12/28/10) was set up and demonstrated EF 20-25% and normal coronary arteries.  RA  4, RV 35/11, PA  28/8 (17), PCWP 8. Fick 5.2/2.4 PVR 1.7 Woods. Fick 5.2/2.4 Aortic saturation was 97%.  PA saturation was 67% and 68%. ECHO 09/03/13 EF 20-25%   Cardiac MRI on 07/19/11: Moderate to severe LVE, Diffuse hypokinesis. EF 32%, Mild  LAE  Optivol today reviewed personally: Fluid index well below threshold. Activity < 1 hour per day. NO VT/VF   Labs 09/03/13 k 3.3 Creatinine 0.86 Pro BNP 998 Dig level 0.7 SH: lived with her husband. Does not drive. Does not drink alcohol or smoke    Past Medical History  Diagnosis Date  . 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 Cardiac MRI 05/2011 EF 32%. ICD implantation 10/2011 ([employer]tr[employer])  . Peripheral neuropathy     chemo- induced  . Hypertension     c/b orthostatic hypotention  . Depression   . Palpitation     normal sinus rhythm only on 21 day heart monitor  . Obesity   . Fibromyalgia   . GERD (gastroesophageal reflux disease)   . Heart murmur   . Breast calcifications     right breast  . Interstitial cystitis   . Anxiety   . Nonischemic cardiomyopathy     related to chemo; EF 30% 05/2011  . ICD (implantable cardiac defibrillator) in place   . Pneumonia 2000's    "once"  . Type II diabetes mellitus   . Blood transfusion 1980's    1987 or 1988  . Anemia 1980's    [id number]  . History of stomach ulcers   . Breast cancer     right;  . Conversion disorder   . 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   . 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 ALPRAZolam (XANAX) 0.5 MG tablet TAKE ONE TABLET BY MOUTH AT BEDTIME AS NEEDED FOR ANXIETY  90 tablet  1  . B Complex-C (B-COMPLEX WITH VITAMIN C) tablet Take 1 tablet by mouth daily.      . carvedilol (COREG) 25 MG tablet TAKE 1 TABLET BY MOUTH TWICE A DAY WITH A MEAL, MUST HAVE FOLLOW UP APPOINTMENT FOR FURTHER REFILLS  60 tablet  1  . Cholecalciferol (VITAMIN D3) 5000 UNITS CAPS Take 1 capsule by  mouth daily.       . Coenzyme Q10 (CO Q-10) 100 MG CAPS Take 100 mg by mouth daily.      . Cyanocobalamin 2500 MCG SUBL Place 2,500 mcg under the tongue daily.      . digoxin (LANOXIN) 0.125 MG tablet TAKE 1 TABLET BY MOUTH DAILY  30 tablet  2  . divalproex (DEPAKOTE ER) 250 MG 24 hr tablet TAKE 1 TABLET (250 MG TOTAL) BY MOUTH DAILY.  90 tablet  2  . GLUCOSAMINE-CHONDROITIN PO Take 1,200 mg by mouth daily.      Marland Kitchen loratadine (CLARITIN) 10 MG tablet Take 10 mg by mouth daily.      . methocarbamol (ROBAXIN) 750 MG tablet Take 750 mg by mouth 2 (two) times daily.       . metolazone (ZAROXOLYN) 2.5 MG tablet TAKE 1 TABLET (2.5 MG TOTAL) BY MOUTH AS DIRECTED.  5 tablet  1  . mometasone-formoterol (DULERA) 100-5 MCG/ACT AERO Inhale 2 puffs into the lungs 2 (two) times daily.  1 Inhaler  5  . montelukast (SINGULAIR) 10 MG tablet Take 1 tablet (10 mg total) by mouth at bedtime.  30 tablet  5  . Multiple Vitamin (MULTIVITAMIN WITH MINERALS) TABS Take 1 tablet by mouth daily.      Marland Kitchen oxyCODONE-acetaminophen (PERCOCET) 10-325 MG per tablet Take 1 tablet by mouth every 4 (four) hours as needed. For pain      . PATADAY 0.2 % SOLN       . pentosan polysulfate (ELMIRON) 100 MG capsule Take 100 mg by mouth 2 (two) times daily.      . potassium chloride SA (K-DUR,KLOR-CON) 20 MEQ tablet Take 20 mEq by mouth daily. With fluid pill      . pregabalin (LYRICA) 200 MG capsule Take 600 mg by mouth at bedtime.       Marland Kitchen PRODIGY NO CODING BLOOD GLUC test strip       . RABEprazole (ACIPHEX) 20 MG tablet Take 20 mg by mouth daily.        Marland Kitchen spironolactone (ALDACTONE) 25 MG tablet Take 0.5 tablets (12.5 mg total) by mouth daily.  15 tablet  6  . zolpidem (AMBIEN CR) 12.5 MG CR tablet Take 12.5 mg by mouth.       Marland Kitchen albuterol (PROAIR HFA) 108 (90 BASE) MCG/ACT inhaler Inhale 1-2 puffs into the lungs every 6 (six) hours as needed for wheezing or shortness of breath. Pt needs appt for more refills  8.5 each  0   No current  facility-administered medications on file prior to encounter.     Allergies  Allergen Reactions  . Other Rash    Chloraprep  . Reglan [Metoclopramide Hcl] Anxiety   ROS: All pertinent positives or negatives  as in HPI otherwise negative   Vital Signs: Filed Vitals:   02/13/14 1151  BP: 102/61  Pulse: 100  Resp: 20  Weight: 235 lb 8 oz (106.822 kg)  SpO2: 96%   PHYSICAL EXAM: Well nourished, well developed, in no acute distress Mom and Husband present  HEENT: normal Neck: JVP flat Cardiac:  Lateral PMI, normal S1, S2; RRR; no murmur, No S3L upper chest scar ICD Lungs:  clear to auscultation bilaterally, no wheezing, rhonchi or rales Abd: obese, soft, nontender, nondistended Ext: No lower extremity edema.    Skin: warm and dry Neuro:  CNs 2-12 intact, no focal abnormalities noted Psych: Normal affect  ASSESSMENT AND PLAN: 1. Chronic systolic CHF: Nonischemic cardiomyopathy.  Medtronic ICD. EF 08/3013 20-25% with moderately dilated LV.  NYHA IIIB. Volume status stable which  coincides with optivol . -  Continue  KCl 20 mEq daily - Continue current Coreg, digoxin, spironolactone and low dose losartan. Dig level ok.   - Would like CPX but she would like to hold off. She declines cardiac rehab.  She refuses repeat BMET.   2. Conversion disorder: Followed by neurology at [facility] 3. Morbid obesity: Needs to work at diet/exercise for weight loss.   Follow up in 3 months with Dr Donnamae Jude 02/13/2014

## 2014-02-13 ENCOUNTER — Ambulatory Visit (HOSPITAL_COMMUNITY)
Admission: RE | Admit: 2014-02-13 | Discharge: 2014-02-13 | Disposition: A | Discharge status: Home / Self Care | Payer: Medicare Other | Source: Ambulatory Visit | Attending: Cardiology | Admitting: Cardiology

## 2014-02-13 ENCOUNTER — Encounter (HOSPITAL_COMMUNITY): Payer: Self-pay

## 2014-02-13 VITALS — BP 102/61 | HR 100 | Resp 20 | Wt 235.5 lb

## 2014-02-13 DIAGNOSIS — J449 Chronic obstructive pulmonary disease, unspecified: Secondary | ICD-10-CM | POA: Insufficient documentation

## 2014-02-13 DIAGNOSIS — I428 Other cardiomyopathies: Secondary | ICD-10-CM | POA: Diagnosis not present

## 2014-02-13 DIAGNOSIS — R279 Unspecified lack of coordination: Secondary | ICD-10-CM | POA: Diagnosis not present

## 2014-02-13 DIAGNOSIS — Z95810 Presence of automatic (implantable) cardiac defibrillator: Secondary | ICD-10-CM | POA: Insufficient documentation

## 2014-02-13 DIAGNOSIS — F3289 Other specified depressive episodes: Secondary | ICD-10-CM | POA: Insufficient documentation

## 2014-02-13 DIAGNOSIS — Z888 Allergy status to other drugs, medicaments and biological substances status: Secondary | ICD-10-CM | POA: Insufficient documentation

## 2014-02-13 DIAGNOSIS — Z79899 Other long term (current) drug therapy: Secondary | ICD-10-CM | POA: Insufficient documentation

## 2014-02-13 DIAGNOSIS — Z9109 Other allergy status, other than to drugs and biological substances: Secondary | ICD-10-CM | POA: Insufficient documentation

## 2014-02-13 DIAGNOSIS — F449 Dissociative and conversion disorder, unspecified: Secondary | ICD-10-CM | POA: Insufficient documentation

## 2014-02-13 DIAGNOSIS — R011 Cardiac murmur, unspecified: Secondary | ICD-10-CM | POA: Insufficient documentation

## 2014-02-13 DIAGNOSIS — I5022 Chronic systolic (congestive) heart failure: Secondary | ICD-10-CM

## 2014-02-13 DIAGNOSIS — F329 Major depressive disorder, single episode, unspecified: Secondary | ICD-10-CM | POA: Insufficient documentation

## 2014-02-13 DIAGNOSIS — F411 Generalized anxiety disorder: Secondary | ICD-10-CM | POA: Diagnosis not present

## 2014-02-13 DIAGNOSIS — K219 Gastro-esophageal reflux disease without esophagitis: Secondary | ICD-10-CM | POA: Diagnosis not present

## 2014-02-13 DIAGNOSIS — C50919 Malignant neoplasm of unspecified site of unspecified female breast: Secondary | ICD-10-CM | POA: Insufficient documentation

## 2014-02-13 DIAGNOSIS — I509 Heart failure, unspecified: Secondary | ICD-10-CM | POA: Diagnosis not present

## 2014-02-13 DIAGNOSIS — I10 Essential (primary) hypertension: Secondary | ICD-10-CM | POA: Diagnosis not present

## 2014-02-13 DIAGNOSIS — R002 Palpitations: Secondary | ICD-10-CM | POA: Insufficient documentation

## 2014-02-13 DIAGNOSIS — N3010 Interstitial cystitis (chronic) without hematuria: Secondary | ICD-10-CM | POA: Diagnosis not present

## 2014-02-13 DIAGNOSIS — IMO0001 Reserved for inherently not codable concepts without codable children: Secondary | ICD-10-CM | POA: Insufficient documentation

## 2014-02-13 DIAGNOSIS — E119 Type 2 diabetes mellitus without complications: Secondary | ICD-10-CM | POA: Diagnosis not present

## 2014-02-13 DIAGNOSIS — T451X5A Adverse effect of antineoplastic and immunosuppressive drugs, initial encounter: Secondary | ICD-10-CM | POA: Diagnosis not present

## 2014-02-13 DIAGNOSIS — J4489 Other specified chronic obstructive pulmonary disease: Secondary | ICD-10-CM | POA: Diagnosis not present

## 2014-02-13 DIAGNOSIS — G620 Drug-induced polyneuropathy: Secondary | ICD-10-CM | POA: Insufficient documentation

## 2014-02-13 NOTE — Patient Instructions (Signed)
Follow up in 3 months with Dr.Bensimhon   

## 2014-02-21 ENCOUNTER — Encounter: Payer: Self-pay | Admitting: Internal Medicine

## 2014-02-21 ENCOUNTER — Other Ambulatory Visit: Payer: Self-pay | Admitting: Neurology

## 2014-02-26 ENCOUNTER — Other Ambulatory Visit: Payer: Self-pay | Admitting: Pulmonary Disease

## 2014-02-26 DIAGNOSIS — R0602 Shortness of breath: Secondary | ICD-10-CM

## 2014-02-27 ENCOUNTER — Ambulatory Visit: Payer: Medicare Other | Admitting: Pulmonary Disease

## 2014-02-27 DIAGNOSIS — H1045 Other chronic allergic conjunctivitis: Secondary | ICD-10-CM | POA: Diagnosis not present

## 2014-02-27 DIAGNOSIS — H25019 Cortical age-related cataract, unspecified eye: Secondary | ICD-10-CM | POA: Diagnosis not present

## 2014-02-27 DIAGNOSIS — H40009 Preglaucoma, unspecified, unspecified eye: Secondary | ICD-10-CM | POA: Diagnosis not present

## 2014-02-27 DIAGNOSIS — H04129 Dry eye syndrome of unspecified lacrimal gland: Secondary | ICD-10-CM | POA: Diagnosis not present

## 2014-02-27 DIAGNOSIS — H40019 Open angle with borderline findings, low risk, unspecified eye: Secondary | ICD-10-CM | POA: Diagnosis not present

## 2014-02-27 DIAGNOSIS — H524 Presbyopia: Secondary | ICD-10-CM | POA: Diagnosis not present

## 2014-02-27 DIAGNOSIS — H52229 Regular astigmatism, unspecified eye: Secondary | ICD-10-CM | POA: Diagnosis not present

## 2014-02-27 DIAGNOSIS — H5200 Hypermetropia, unspecified eye: Secondary | ICD-10-CM | POA: Diagnosis not present

## 2014-03-04 ENCOUNTER — Ambulatory Visit (INDEPENDENT_AMBULATORY_CARE_PROVIDER_SITE_OTHER): Payer: Medicare Other | Admitting: *Deleted

## 2014-03-04 ENCOUNTER — Telehealth: Payer: Self-pay | Admitting: Cardiology

## 2014-03-04 DIAGNOSIS — I428 Other cardiomyopathies: Secondary | ICD-10-CM | POA: Diagnosis not present

## 2014-03-04 DIAGNOSIS — I5022 Chronic systolic (congestive) heart failure: Secondary | ICD-10-CM

## 2014-03-04 LAB — MDC_IDC_ENUM_SESS_TYPE_REMOTE
Date Time Interrogation Session: 20150921191025
HIGH POWER IMPEDANCE MEASURED VALUE: 61 Ohm
HighPow Impedance: 19 Ohm
HighPow Impedance: 361 Ohm
Lead Channel Impedance Value: 361 Ohm
Lead Channel Pacing Threshold Amplitude: 1.125 V
Lead Channel Pacing Threshold Pulse Width: 0.4 ms
Lead Channel Sensing Intrinsic Amplitude: 15.75 mV
Lead Channel Sensing Intrinsic Amplitude: 15.75 mV
MDC IDC MSMT BATTERY VOLTAGE: 3.17 V
MDC IDC SET LEADCHNL RV PACING AMPLITUDE: 2.25 V
MDC IDC SET LEADCHNL RV PACING PULSEWIDTH: 0.4 ms
MDC IDC SET LEADCHNL RV SENSING SENSITIVITY: 0.3 mV
MDC IDC SET ZONE DETECTION INTERVAL: 250 ms
MDC IDC STAT BRADY RV PERCENT PACED: 0 %
Zone Setting Detection Interval: 300 ms
Zone Setting Detection Interval: 400 ms

## 2014-03-04 NOTE — Progress Notes (Signed)
Remote ICD transmission.   

## 2014-03-04 NOTE — Telephone Encounter (Signed)
Spoke with pt and reminded pt of remote transmission that is due today. Pt verbalized understanding.   

## 2014-03-06 ENCOUNTER — Telehealth: Payer: Self-pay | Admitting: *Deleted

## 2014-03-06 NOTE — Telephone Encounter (Signed)
Pt requested updated OptiVol results once [facility] was processed. OptiVol results are normal/stable; no saturation. Battery life is excellent. No episodes or alerts. Pt thankful for update. ROV w/ Dr. Caryl Comes 05/24/14.

## 2014-03-13 ENCOUNTER — Other Ambulatory Visit (HOSPITAL_COMMUNITY): Payer: Self-pay | Admitting: *Deleted

## 2014-03-13 MED ORDER — CARVEDILOL 25 MG PO TABS: ORAL_TABLET | ORAL | Status: DC

## 2014-03-18 ENCOUNTER — Encounter: Payer: Self-pay | Admitting: Cardiology

## 2014-03-25 ENCOUNTER — Ambulatory Visit: Payer: Medicare Other | Admitting: Pulmonary Disease

## 2014-03-26 ENCOUNTER — Encounter: Payer: Self-pay | Admitting: Internal Medicine

## 2014-04-25 ENCOUNTER — Telehealth: Payer: Self-pay | Admitting: Neurology

## 2014-04-25 NOTE — Telephone Encounter (Signed)
Patient requesting referral to another Neurologist.  Referral to [facility] didn't work out.  Please call and advise.  Please call mobile 1st @ [phone] if unable to reach call home # [phone].

## 2014-04-26 ENCOUNTER — Other Ambulatory Visit (HOSPITAL_COMMUNITY): Payer: Self-pay | Admitting: Internal Medicine

## 2014-04-26 NOTE — Telephone Encounter (Signed)
Can we try [facility] or [facility] for her?

## 2014-04-30 ENCOUNTER — Other Ambulatory Visit: Payer: Self-pay | Admitting: Internal Medicine

## 2014-05-02 ENCOUNTER — Telehealth: Payer: Self-pay | Admitting: Oncology

## 2014-05-02 NOTE — Telephone Encounter (Signed)
pt cld & left vm wanting to sch appt-cld pt back left message n/a

## 2014-05-16 ENCOUNTER — Telehealth: Payer: Self-pay | Admitting: Oncology

## 2014-05-16 NOTE — Telephone Encounter (Signed)
pt cld & left a vm stating she was pt here several yrs ago and was [facility] pt. Wanted GM to see her having some new things going on. Trans to Val to spk w/pt

## 2014-05-17 ENCOUNTER — Ambulatory Visit (HOSPITAL_COMMUNITY)
Admission: RE | Admit: 2014-05-17 | Discharge: 2014-05-17 | Disposition: A | Discharge status: Home / Self Care | Payer: Medicare Other | Source: Ambulatory Visit | Attending: Internal Medicine | Admitting: Internal Medicine

## 2014-05-17 ENCOUNTER — Encounter (HOSPITAL_COMMUNITY): Payer: Self-pay

## 2014-05-17 ENCOUNTER — Telehealth: Payer: Self-pay | Admitting: *Deleted

## 2014-05-17 DIAGNOSIS — Z853 Personal history of malignant neoplasm of breast: Secondary | ICD-10-CM | POA: Diagnosis not present

## 2014-05-17 DIAGNOSIS — T451X5A Adverse effect of antineoplastic and immunosuppressive drugs, initial encounter: Secondary | ICD-10-CM | POA: Insufficient documentation

## 2014-05-17 DIAGNOSIS — K219 Gastro-esophageal reflux disease without esophagitis: Secondary | ICD-10-CM | POA: Diagnosis not present

## 2014-05-17 DIAGNOSIS — Z95810 Presence of automatic (implantable) cardiac defibrillator: Secondary | ICD-10-CM | POA: Diagnosis not present

## 2014-05-17 DIAGNOSIS — E119 Type 2 diabetes mellitus without complications: Secondary | ICD-10-CM | POA: Insufficient documentation

## 2014-05-17 DIAGNOSIS — F449 Dissociative and conversion disorder, unspecified: Secondary | ICD-10-CM | POA: Insufficient documentation

## 2014-05-17 DIAGNOSIS — J449 Chronic obstructive pulmonary disease, unspecified: Secondary | ICD-10-CM | POA: Insufficient documentation

## 2014-05-17 DIAGNOSIS — M797 Fibromyalgia: Secondary | ICD-10-CM | POA: Diagnosis not present

## 2014-05-17 DIAGNOSIS — I427 Cardiomyopathy due to drug and external agent: Secondary | ICD-10-CM | POA: Insufficient documentation

## 2014-05-17 DIAGNOSIS — Z888 Allergy status to other drugs, medicaments and biological substances status: Secondary | ICD-10-CM | POA: Diagnosis not present

## 2014-05-17 DIAGNOSIS — F419 Anxiety disorder, unspecified: Secondary | ICD-10-CM | POA: Diagnosis not present

## 2014-05-17 DIAGNOSIS — I5022 Chronic systolic (congestive) heart failure: Secondary | ICD-10-CM | POA: Diagnosis not present

## 2014-05-17 DIAGNOSIS — Z79899 Other long term (current) drug therapy: Secondary | ICD-10-CM | POA: Insufficient documentation

## 2014-05-17 DIAGNOSIS — F329 Major depressive disorder, single episode, unspecified: Secondary | ICD-10-CM | POA: Diagnosis not present

## 2014-05-17 DIAGNOSIS — G620 Drug-induced polyneuropathy: Secondary | ICD-10-CM | POA: Diagnosis not present

## 2014-05-17 DIAGNOSIS — I10 Essential (primary) hypertension: Secondary | ICD-10-CM | POA: Insufficient documentation

## 2014-05-17 DIAGNOSIS — J45909 Unspecified asthma, uncomplicated: Secondary | ICD-10-CM | POA: Insufficient documentation

## 2014-05-17 LAB — BASIC METABOLIC PANEL
Anion gap: 15 (ref 5–15)
BUN: 13 mg/dL (ref 6–23)
CO2: 23 mEq/L (ref 19–32)
Calcium: 9.4 mg/dL (ref 8.4–10.5)
Chloride: 97 mEq/L (ref 96–112)
Creatinine, Ser: 0.9 mg/dL (ref 0.50–1.10)
GFR calc Af Amer: 89 mL/min — ABNORMAL LOW (ref >90)
GFR, EST NON AFRICAN AMERICAN: 77 mL/min — AB (ref >90)
Glucose, Bld: 80 mg/dL (ref 70–99)
POTASSIUM: 4.7 meq/L (ref 3.7–5.3)
SODIUM: 135 meq/L — AB (ref 137–147)

## 2014-05-17 LAB — DIGOXIN LEVEL: DIGOXIN LVL: 1.2 ng/mL (ref 0.8–2.0)

## 2014-05-17 LAB — PRO B NATRIURETIC PEPTIDE: PRO B NATRI PEPTIDE: 1009 pg/mL — AB (ref 0–125)

## 2014-05-17 MED ORDER — SPIRONOLACTONE 25 MG PO TABS: 12.5000 mg | ORAL_TABLET | Freq: Every day | ORAL | Status: DC

## 2014-05-17 NOTE — Telephone Encounter (Signed)
Pt called and spoke with Val to request to see Dr. Jana Hakim.  Val brought information to me requesting for me to schedule her.  Called and confirmed 05/29/14 appt w/ pt.  Made Val aware.

## 2014-05-17 NOTE — Patient Instructions (Addendum)
Only take Spironolactone 1/2 tab daily  Labs today  Your physician has requested that you have an echocardiogram. Echocardiography is a painless test that uses sound waves to create images of your heart. It provides your doctor with information about the size and shape of your heart and how well your heart's chambers and valves are working. This procedure takes approximately one hour. There are no restrictions for this procedure.  Your physician recommends that you schedule a follow-up appointment in: 6 weeks with echocardiogram

## 2014-05-17 NOTE — Progress Notes (Signed)
Patient ID: [name], female   DOB: [birth year], 43 y.o.   MRN: [id number]  Primary Cardiologist:  Dr. Glori Bickers Neurlogist: Dohmeir   PCP: Dr. Margaretha Sheffield Griffin/Noel Redmon, PAC   History of Present Illness: [name] is a 43 y.o. female with a history of morbid obesity, depression, fibromyalgia, congestive heart failure secondary to nonischemic cardiomyopathy related to her chemotherapy for breast cancer. Last chemo 2008. Evaluated by neuro for stuttering.  Felt to have a conversion disorder.  She returns for follow up. Last visit she was offered cardiac rehab and declined. Also declined blood work.  Intermittent dyspnea with exertion.  Evaluated by Neurology at [facility] in June they confirmed conversion disorder. She continues to struggle with conversion disorder.  Evaluated by pulmonary August 12 and Dr Elsworth Soho told he saw no evidence of asthma. Weight at home 244 pounds. Taking all medications. She is taking torsemide 20 mg daily  3-4 times a week. Says she takes metolazone 1-2 times a month.    CPX Feb 2010. Peak VO2 was 14.5 which was 71% of predicted. When corrected for body weight the VO2 was 20.7. The slope was 27. RER 1.20. O2 pulse 73%. Overall this was felt to be only a very mild functional limitation due to her obesity and mild circulatory limitation.  CPX 2/11: pVO2 13.0 (72%) correct for ideal wt 59ml/kg/min RER 1.06 (submax) slope 28.2 O2 pulse 91% - felt no signifcant cardiac limitation. + deconditioning.   Echo 04/2009  EF back down to 25%.  RHC normal. Echo 07/2009 showed EF 50%  Echo 06/2010 EF 40%. So we did MUGA EF 58% Echo 12/2010 40-45% Echo 05/2011 EF 30%. Grade 2 diastolic dysfunction Cath (12/28/10) was set up and demonstrated EF 20-25% and normal coronary arteries.  RA  4, RV 35/11, PA  28/8 (17), PCWP 8. Fick 5.2/2.4 PVR 1.7 Woods. Fick 5.2/2.4 Aortic saturation was 97%.  PA saturation was 67% and 68%. ECHO 09/03/13 EF 20-25%   Cardiac MRI on 07/19/11: Moderate to severe  LVE, Diffuse hypokinesis. EF 32%, Mild LAE  Optivol today reviewed personally: Fluid index well below threshold. Activity < 1 hour per day. NO VT/VF   Labs 09/03/13 k 3.3 Creatinine 0.86 Pro BNP 998 Dig level 0.7 SH: lived with her husband. Does not drive. Does not drink alcohol or smoke    Past Medical History  Diagnosis Date  . 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 Cardiac MRI 05/2011 EF 32%. ICD implantation 10/2011 (Medtronic)  . Peripheral neuropathy     chemo- induced  . Hypertension     c/b orthostatic hypotention  . Depression   . Palpitation     normal sinus rhythm only on 21 day heart monitor  . Obesity   . Fibromyalgia   . GERD (gastroesophageal reflux disease)   . Heart murmur   . Breast calcifications     right breast  . Interstitial cystitis   . Anxiety   . Nonischemic cardiomyopathy     related to chemo; EF 30% 05/2011  . ICD (implantable cardiac defibrillator) in place   . Pneumonia 2000's    "once"  . Type II diabetes mellitus   . Blood transfusion 1980's    1987 or 1988  . Anemia 1980's    [id number]  . History of stomach ulcers   . Breast cancer     right;  . Conversion disorder   .  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   . 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 albuterol (PROAIR HFA) 108 (90 BASE) MCG/ACT inhaler Inhale 1-2 puffs into the lungs every 6 (six) hours as needed for wheezing or shortness of breath. Pt needs appt for more refills 8.5 each 0  . ALPRAZolam (XANAX) 0.5 MG tablet TAKE ONE TABLET BY MOUTH AT BEDTIME AS NEEDED FOR ANXIETY 90 tablet 1  . B Complex-C (B-COMPLEX WITH VITAMIN C) tablet Take 1 tablet by  mouth daily.    . carvedilol (COREG) 25 MG tablet TAKE 1 TABLET BY MOUTH TWICE A DAY WITH A MEAL, MUST HAVE FOLLOW UP APPOINTMENT FOR FURTHER REFILLS 60 tablet 6  . Cholecalciferol (VITAMIN D3) 5000 UNITS CAPS Take 1 capsule by mouth daily.     . Coenzyme Q10 (CO Q-10) 100 MG CAPS Take 100 mg by mouth daily.    . Cyanocobalamin 2500 MCG SUBL Place 2,500 mcg under the tongue daily.    . digoxin (LANOXIN) 0.125 MG tablet TAKE 1 TABLET EVERY DAY 30 tablet 6  . divalproex (DEPAKOTE ER) 250 MG 24 hr tablet TAKE 1 TABLET (250 MG TOTAL) BY MOUTH DAILY. 90 tablet 2  . GLUCOSAMINE-CHONDROITIN PO Take 1,200 mg by mouth daily.    Marland Kitchen loratadine (CLARITIN) 10 MG tablet Take 10 mg by mouth daily.    . methocarbamol (ROBAXIN) 750 MG tablet Take 750 mg by mouth 2 (two) times daily.     . metolazone (ZAROXOLYN) 2.5 MG tablet TAKE 1 TABLET (2.5 MG TOTAL) BY MOUTH AS DIRECTED. 5 tablet 1  . mometasone-formoterol (DULERA) 100-5 MCG/ACT AERO Inhale 2 puffs into the lungs 2 (two) times daily. 1 Inhaler 5  . montelukast (SINGULAIR) 10 MG tablet Take 1 tablet (10 mg total) by mouth at bedtime. 30 tablet 5  . Multiple Vitamin (MULTIVITAMIN WITH MINERALS) TABS Take 1 tablet by mouth daily.    Marland Kitchen oxyCODONE-acetaminophen (PERCOCET) 10-325 MG per tablet Take 1 tablet by mouth every 4 (four) hours as needed. For pain    . PATADAY 0.2 % SOLN     . pentosan polysulfate (ELMIRON) 100 MG capsule Take 100 mg by mouth 2 (two) times daily.    . potassium chloride SA (K-DUR,KLOR-CON) 20 MEQ tablet Take 20 mEq by mouth daily. With fluid pill    . pregabalin (LYRICA) 200 MG capsule Take 600 mg by mouth at bedtime.     Marland Kitchen PRODIGY NO CODING BLOOD GLUC test strip     . RABEprazole (ACIPHEX) 20 MG tablet Take 20 mg by mouth daily.      Marland Kitchen spironolactone (ALDACTONE) 25 MG tablet Take 0.5 tablets (12.5 mg total) by mouth daily. (Patient taking differently: Take 25 mg by mouth daily. ) 15 tablet 6  . torsemide (DEMADEX) 20 MG tablet AS NEEDED  FOR FLUID RETENTION TO BE TAKEN WITH POTASSIUM    . zolpidem (AMBIEN CR) 12.5 MG CR tablet Take 12.5 mg by mouth.      No current facility-administered medications on file prior to encounter.     Allergies  Allergen Reactions  . Other Rash    Chloraprep  . Reglan [Metoclopramide Hcl] Anxiety   ROS: All pertinent positives or negatives as in HPI otherwise negative   Vital Signs: Filed Vitals:   05/17/14 1010  BP: 100/70  Pulse: 81  Weight: 245 lb 12.8 oz (111.494 kg)  SpO2: 96%   PHYSICAL EXAM: Well nourished, well developed, in no acute distress Mom and Husband present  HEENT: normal Neck: JVP flat Cardiac:  Lateral PMI, normal S1, S2; RRR; no murmur, No S3L upper chest scar ICD Lungs:  clear to auscultation bilaterally, no wheezing, rhonchi or rales Abd: obese, soft, nontender, nondistended Ext: No lower extremity edema.    Skin: warm and dry Neuro:  CNs 2-12 intact, no focal abnormalities noted Psych: Normal affect  ASSESSMENT AND PLAN: 1. Chronic systolic CHF: Nonischemic cardiomyopathy.  Medtronic ICD. EF 08/3013 20-25% with moderately dilated LV.  NYHA IIIB.Weight up 10 pounds form last visit.   Volume status stable which  coincides with optivol. On goal dose 25 mg twice a day and dig at current dose. At one point she was supposed start losartan but had hyperkalemia from the last lab work 08/2013 so this was not started.   She refused lab work in October but agreeable today.   -Check BMET, dig level, pro bnp  Now Optivol- Fluid well below threshold. Activity less than 1 hour  2. Conversion disorder: Followed by neurology at [facility] 3. Morbid obesity: Needs to work at diet/exercise for weight loss.   Follow up in 4 months with an ECHO and Dr Haroldine Laws  CLEGG,AMY NP-C  05/17/2014  Patient seen and examined with Darrick Grinder, NP. We discussed all aspects of the encounter. I agree with the assessment and plan as stated above.   Continues to struggle with multiple issues  including NYHA III HF. Volume status up and down. Stressed need to follow her weights more closely and be more consistent with diuretic regimen. On goal dose carvedilol. Unable to start ARB due to hyperkalemia. Will recheck BMET today and if K improved can reconsider. Will need repeat echo at next visit. Not candidate for advanced therapies due to comorbidities. I reviewed ICD interrogation personally.   Daniel Bensimhon,MD 11:12 AM

## 2014-05-20 MED ORDER — DIGOXIN 125 MCG PO TABS: 0.0625 mg | ORAL_TABLET | Freq: Every day | ORAL | Status: DC

## 2014-05-20 NOTE — Addendum Note (Signed)
Encounter addended by: Kerry Dory, CMA on: 05/20/2014  5:07 PM<BR>     Documentation filed: Orders

## 2014-05-23 ENCOUNTER — Encounter (HOSPITAL_COMMUNITY): Payer: Self-pay | Admitting: Internal Medicine

## 2014-05-24 ENCOUNTER — Ambulatory Visit (INDEPENDENT_AMBULATORY_CARE_PROVIDER_SITE_OTHER): Payer: Medicare Other | Admitting: Internal Medicine

## 2014-05-24 ENCOUNTER — Encounter: Payer: Self-pay | Admitting: Internal Medicine

## 2014-05-24 VITALS — BP 98/66 | HR 76 | Ht 67.0 in | Wt 239.6 lb

## 2014-05-24 DIAGNOSIS — I5022 Chronic systolic (congestive) heart failure: Secondary | ICD-10-CM

## 2014-05-24 DIAGNOSIS — Z4502 Encounter for adjustment and management of automatic implantable cardiac defibrillator: Secondary | ICD-10-CM | POA: Diagnosis not present

## 2014-05-24 DIAGNOSIS — I428 Other cardiomyopathies: Secondary | ICD-10-CM

## 2014-05-24 DIAGNOSIS — I429 Cardiomyopathy, unspecified: Secondary | ICD-10-CM

## 2014-05-24 LAB — MDC_IDC_ENUM_SESS_TYPE_INCLINIC
Date Time Interrogation Session: 20151211153106
HighPow Impedance: 19 Ohm
HighPow Impedance: 361 Ohm
HighPow Impedance: 71 Ohm
Lead Channel Pacing Threshold Amplitude: 1 V
Lead Channel Sensing Intrinsic Amplitude: 16.375 mV
Lead Channel Setting Sensing Sensitivity: 0.3 mV
MDC IDC MSMT BATTERY VOLTAGE: 3.14 V
MDC IDC MSMT LEADCHNL RV IMPEDANCE VALUE: 399 Ohm
MDC IDC MSMT LEADCHNL RV PACING THRESHOLD PULSEWIDTH: 0.4 ms
MDC IDC SET LEADCHNL RV PACING AMPLITUDE: 2 V
MDC IDC SET LEADCHNL RV PACING PULSEWIDTH: 0.4 ms
MDC IDC STAT BRADY RV PERCENT PACED: 0.01 %
Zone Setting Detection Interval: 250 ms
Zone Setting Detection Interval: 300 ms
Zone Setting Detection Interval: 400 ms

## 2014-05-24 NOTE — Patient Instructions (Signed)
Your physician recommends that you continue on your current medications as directed. Please refer to the Current Medication list given to you today.  Your physician wants you to follow-up in: 12  Months with Dr. Gari Crown will receive a reminder letter in the mail two months in advance. If you don't receive a letter, please call our office to schedule the follow-up appointment.  Thank you for visiting with [facility] today!!

## 2014-05-24 NOTE — Progress Notes (Signed)
Patient Care Team: Lennie Odor, PA-C as PCP - General (Nurse Practitioner)   HPI  [name] is a 43 y.o. female Seen in followup for an ICD implanted in May 2013 for primary prevention with nonischemic cardiomyopathy and a QRS  She is doing relatively well with stable shortness of breath. She does not have peripheral edema.  She is interested in bariatric surgery.  She has significant fatigue and dizziness   Past Medical History  Diagnosis Date  . 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 Cardiac MRI 05/2011 EF 32%. ICD implantation 10/2011 (Medtronic)  . Peripheral neuropathy     chemo- induced  . Hypertension     c/b orthostatic hypotention  . Depression   . Palpitation     normal sinus rhythm only on 21 day heart monitor  . Obesity   . Fibromyalgia   . GERD (gastroesophageal reflux disease)   . Heart murmur   . Breast calcifications     right breast  . Interstitial cystitis   . Anxiety   . Nonischemic cardiomyopathy     related to chemo; EF 30% 05/2011  . ICD (implantable cardiac defibrillator) in place   . Pneumonia 2000's    "once"  . Type II diabetes mellitus   . Blood transfusion 1980's    1987 or 1988  . Anemia 1980's    [id number]  . History of stomach ulcers   . Breast cancer     right;  . Conversion disorder   . 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   . 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 Laparoscopic endometriosis fulguration  1990's  . Cardiac defibrillator placement  11/03/11  . Tonsillectomy  1980's  . Tubal ligation  1990's  . Vaginal hysterectomy  2000's  . Cholecystectomy  ~ 2009  . Breast lumpectomy  2008; 2013    right  . Port-a-cath  removal  2010?    left chest; placed in 2008  . Nasal sinus surgery    . Implantable cardioverter defibrillator implant N/A 11/03/2011    Procedure: IMPLANTABLE CARDIOVERTER DEFIBRILLATOR IMPLANT;  Surgeon: Deboraha Sprang, MD;  Location: [facility] CATH LAB;  Service: Cardiovascular;  Laterality: N/A;    Current Outpatient Prescriptions  Medication Sig Dispense Refill  . albuterol (PROAIR HFA) 108 (90 BASE) MCG/ACT inhaler Inhale 1-2 puffs into the lungs every 6 (six) hours as needed for wheezing or shortness of breath. Pt needs appt for more refills 8.5 each 0  . ALPRAZolam (XANAX) 0.5 MG tablet TAKE ONE TABLET BY MOUTH AT BEDTIME AS NEEDED FOR ANXIETY 90 tablet 1  . B Complex-C (B-COMPLEX WITH VITAMIN C) tablet Take 1 tablet by mouth daily.    . carvedilol (COREG) 25 MG tablet TAKE 1 TABLET BY MOUTH TWICE A DAY WITH A MEAL, MUST HAVE FOLLOW UP APPOINTMENT FOR FURTHER REFILLS 60 tablet 6  . Cholecalciferol (VITAMIN D3) 5000 UNITS CAPS Take 1 capsule by mouth daily.     . Coenzyme Q10 (CO Q-10) 100 MG CAPS Take 100 mg by mouth daily.    . Cyanocobalamin 2500 MCG SUBL Place 2,500 mcg under the tongue daily.    . digoxin (LANOXIN) 0.125 MG tablet Take 0.5 tablets (0.0625  mg total) by mouth daily. 30 tablet 6  . divalproex (DEPAKOTE ER) 250 MG 24 hr tablet TAKE 1 TABLET (250 MG TOTAL) BY MOUTH DAILY. 90 tablet 2  . GLUCOSAMINE-CHONDROITIN PO Take 1,200 mg by mouth daily.    Marland Kitchen loratadine (CLARITIN) 10 MG tablet Take 10 mg by mouth daily.    . methocarbamol (ROBAXIN) 750 MG tablet Take 750 mg by mouth 2 (two) times daily.     . metolazone (ZAROXOLYN) 2.5 MG tablet TAKE 1 TABLET (2.5 MG TOTAL) BY MOUTH AS DIRECTED. 5 tablet 1  . mometasone-formoterol (DULERA) 100-5 MCG/ACT AERO Inhale 2 puffs into the lungs 2 (two) times daily. 1 Inhaler 5  . montelukast (SINGULAIR) 10 MG tablet Take 1 tablet (10 mg total) by mouth at bedtime. 30 tablet 5  . Multiple Vitamin (MULTIVITAMIN WITH MINERALS) TABS Take 1 tablet  by mouth daily.    Marland Kitchen oxyCODONE-acetaminophen (PERCOCET) 10-325 MG per tablet Take 1 tablet by mouth every 4 (four) hours as needed. For pain    . PATADAY 0.2 % SOLN     . pentosan polysulfate (ELMIRON) 100 MG capsule Take 100 mg by mouth 2 (two) times daily.    . potassium chloride SA (K-DUR,KLOR-CON) 20 MEQ tablet Take 20 mEq by mouth daily. With fluid pill    . pregabalin (LYRICA) 200 MG capsule Take 600 mg by mouth at bedtime.     Marland Kitchen PRODIGY NO CODING BLOOD GLUC test strip     . RABEprazole (ACIPHEX) 20 MG tablet Take 20 mg by mouth daily.      Marland Kitchen spironolactone (ALDACTONE) 25 MG tablet Take 0.5 tablets (12.5 mg total) by mouth daily. 15 tablet 6  . torsemide (DEMADEX) 20 MG tablet AS NEEDED FOR FLUID RETENTION TO BE TAKEN WITH POTASSIUM    . zolpidem (AMBIEN CR) 12.5 MG CR tablet Take 12.5 mg by mouth.      No current facility-administered medications for this visit.    Allergies  Allergen Reactions  . Other Rash    Chloraprep  . Reglan [Metoclopramide Hcl] Anxiety    Review of Systems negative except from HPI and PMH  Physical Exam BP 98/66 mmHg  Pulse 76  Ht 5\' 7"  (1.702 m)  Wt 239 lb 9.6 oz (108.682 kg)  BMI 37.52 kg/m2 Well developed and morbildy in no acute distress HENT normal E scleral and icterus clear Neck Supple JVP flat; carotids brisk and full Clear to ausculation  Device pocket well healed; without hematoma or erythema.  There is no tethering Regular rate and rhythm, no murmurs gallops or rub Soft with active bowel sounds No clubbing cyanosis none Edema Alert and oriented, grossly normal motor and sensory function Skin Warm and Dry  ECG demonstrates sinus rhythm at 100 Intervals 16/08/40   Assessment and  Plan  Nonischemic Cardiomyopahty  Guideline directed medical therapy  CHF chronic systolic Euvolemic continue current meds  ICD The patient's device was interrogated.  The information was reviewed. No changes were made in the programming.      Fatigue  multifactorial  seh is euvolemic and we discussed strategies for being proactive using weights for diuretics  Have encouraged exercise starting at as short as it needs with intentional incremental gain

## 2014-05-28 ENCOUNTER — Encounter: Payer: Self-pay | Admitting: Internal Medicine

## 2014-05-29 ENCOUNTER — Ambulatory Visit (HOSPITAL_BASED_OUTPATIENT_CLINIC_OR_DEPARTMENT_OTHER): Payer: Medicare Other | Admitting: Oncology

## 2014-05-29 ENCOUNTER — Telehealth: Payer: Self-pay | Admitting: Oncology

## 2014-05-29 VITALS — BP 107/66 | HR 93 | Temp 98.4°F | Resp 18 | Ht 67.0 in | Wt 238.1 lb

## 2014-05-29 DIAGNOSIS — I429 Cardiomyopathy, unspecified: Secondary | ICD-10-CM | POA: Diagnosis not present

## 2014-05-29 DIAGNOSIS — G620 Drug-induced polyneuropathy: Secondary | ICD-10-CM

## 2014-05-29 DIAGNOSIS — Z853 Personal history of malignant neoplasm of breast: Secondary | ICD-10-CM

## 2014-05-29 DIAGNOSIS — F449 Dissociative and conversion disorder, unspecified: Secondary | ICD-10-CM | POA: Diagnosis not present

## 2014-05-29 DIAGNOSIS — C50911 Malignant neoplasm of unspecified site of right female breast: Secondary | ICD-10-CM

## 2014-05-29 DIAGNOSIS — C50919 Malignant neoplasm of unspecified site of unspecified female breast: Secondary | ICD-10-CM

## 2014-05-29 DIAGNOSIS — Z6837 Body mass index (BMI) 37.0-37.9, adult: Secondary | ICD-10-CM

## 2014-05-29 DIAGNOSIS — G8929 Other chronic pain: Secondary | ICD-10-CM | POA: Diagnosis not present

## 2014-05-29 DIAGNOSIS — M797 Fibromyalgia: Secondary | ICD-10-CM | POA: Diagnosis not present

## 2014-05-29 NOTE — Progress Notes (Signed)
[place]  [phone] [phone]     ID: [name] DOB: 1971-05-19  MR#: [id number]  JME#:[id number]  Patient Care Team: Lennie Odor, PA-C as PCP - General (Nurse Practitioner) PCP: Lennie Odor, PA-C GYN: Ena Dawley SU:  OTHER MD: Glori Bickers, Arloa Koh, Jolyn Nap, Asencion Partridge Dohmeier  CHIEF COMPLAINT: Stage II breast cancer  CURRENT TREATMENT: Observation   BREAST CANCER HISTORY:  from Dr. Collier Salina Rubin's original intake note dated 08/31/2006   "This woman has been in good health.  She had a screening mammogram done via mobile mammogram unit.  A baseline mammogram did show a lesion or suspicious pigmented area at 9 o'clock in the right breast.  [facility]  A digital right diagnostic mammogram and ultrasound was done on 08/01/2006.  Physical examination did palpate a 1.5 cm mass at 9 o'clock position 7 cm from the nipple.  Ultrasound showed this to be 1.5 x 1.3 x 1.0 cm.  Biopsy was recommended.  Biopsy on 08/01/2006 showed invasive adenocarcinoma associated with DCIS, which appeared to be high-grade.  ER and PR were positive at 99% and 98% respectively.  Proliferative index was 16%.  HER-2/neu was 1+.  The patient underwent a lumpectomy and sentinel lymph node evaluation on 08/22/2006.  Prior to that, a MRI of both breasts was done on 08/10/2006, which documented a 2.4 cm spiculated mass in the breast.  No other abnormalities were seen.  The lumpectomy and sentinel lymph node evaluation on 08/22/2006 showed a 2.6 cm, grade 3/3 invasive ductal cancer, lymphovascular invasion was seen focally.  Surgical margins were clear.  A total of five lymph nodes were removed at the time of sentinel node, one of which was involved with malignancy.  The patient has had an unremarkable postoperative course."  Her subsequent history is detailed below   INTERVAL HISTORY: [name] returns today for follow-up of  her breast cancer accompanied by her husband [name]. She was last seen here 2 years ago. She is establishing herself on my service today.  REVIEW OF SYSTEMS:  She suffers from chronic pain secondary to fibromyalgia, problems with interstitial cystitis, residual peripheral neuropathy from her chemotherapy, and a conversion disorder which she tells me means she cannot be left alone. (She cannot drive either, of course). All of this is overshadowed by her severe cardiomyopathy, which is felt to be related to her chemotherapy: Her total dose of doxorubicin was 240 mg/m; she received bevacizumab subsequently.  her defibrillator has never gone off.  she complains of night sweats, insomnia, severe fatigue, pain "all over", which she describes as achy, having continues problem with ear drainage, runny nose, and cough which is intermittently productive of white phlegm. She also complains of a sore throat and hoarseness. She has palpitations. Her ankles swell. She is short of breath even at rest sometimes. She sleeps on 2 pillows. She has heartburn and a history of ulcer. She has abdominal pain sometimes. She has urinary stress incontinence. Her peripheral neuropathy is grade 1 and stable. There've been no recent fevers, rash or bleeding. A detailed review of systems otherwise was noncontributory today    PAST MEDICAL HISTORY: Past Medical History  Diagnosis Date  . 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 Cardiac MRI 05/2011 EF 32%. ICD implantation 10/2011 ([employer])  . Peripheral neuropathy     chemo- induced  . Hypertension  c/b orthostatic hypotention  . Depression   . Palpitation     normal sinus rhythm only on 21 day heart monitor  . Obesity   . Fibromyalgia   . GERD (gastroesophageal reflux disease)   . Heart murmur   . Breast calcifications     right breast  . Interstitial cystitis   .  Anxiety   . Nonischemic cardiomyopathy     related to chemo; EF 30% 05/2011  . ICD (implantable cardiac defibrillator) in place   . Pneumonia 2000's    "once"  . Type II diabetes mellitus   . Blood transfusion 1980's    1987 or 1988  . Anemia 1980's    [id number]  . History of stomach ulcers   . Breast cancer     right;  . Conversion disorder   . 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   . 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 Laparoscopic endometriosis fulguration  1990's  . Cardiac defibrillator placement  11/03/11  . Tonsillectomy  1980's  . Tubal ligation  1990's  . Vaginal hysterectomy  2000's  . Cholecystectomy  ~ 2009  . Breast lumpectomy  2008; 2013    right  . Port-a-cath removal  2010?    left chest; placed in 2008  . Nasal sinus surgery    . Implantable cardioverter defibrillator implant N/A 11/03/2011    Procedure: IMPLANTABLE CARDIOVERTER DEFIBRILLATOR IMPLANT;  Surgeon: Deboraha Sprang, MD;  Location: [facility] CATH LAB;  Service: Cardiovascular;  Laterality: N/A;    FAMILY HISTORY Family History  Problem Relation Age of Onset  . Coronary artery disease    . Heart attack    . Heart disease Mother   . Heart disease Maternal Uncle   . Cancer Paternal Uncle     colon  . Heart disease Maternal Grandmother   . Cancer Paternal Grandmother     ovarian   the patient's father is still living, at age 85. He has a history of Crohn's disease. The patient's mother is living, age 80. She has a history of fibromyalgia. The patient has 3 half-brothers, one sister. The patient's father's mother had either ovarian cancer or cervical cancer. The patient was tested in June 2008 for mutations in the BRCA1 and 2 genes. Complete sequencing found no mutations   GYNECOLOGIC HISTORY:   No LMP recorded. Patient has had a hysterectomy. menarche age 67, first live birth age 83. She is GX P1. She underwent simple hysterectomy, no salpingo-oophorectomy in 2007.   SOCIAL HISTORY:   she used to work for a [employer] in [employer]. Her husband of 23 years, [name], in addition to his regular job is Theme park manager of their [employer]. There daughter,Maya, is currently 13. The patient attends the [employer] locally.     ADVANCED DIRECTIVES: Not in place. The patient's husband is her healthcare power of attorney   HEALTH MAINTENANCE: History  Substance Use Topics  . Smoking status: Never Smoker   . Smokeless tobacco: Never Used  . Alcohol Use: Yes     Comment: 11/03/11 "maybe once a month"     Colonoscopy:  PAP:  Bone density:  Lipid panel:  Allergies  Allergen Reactions  . Other Rash    Chloraprep  . Reglan [Metoclopramide Hcl] Anxiety    Current Outpatient Prescriptions  Medication Sig  Dispense Refill  . albuterol (PROAIR HFA) 108 (90 BASE) MCG/ACT inhaler Inhale 1-2 puffs into the lungs every 6 (six) hours as needed for wheezing or shortness of breath. Pt needs appt for more refills 8.5 each 0  . ALPRAZolam (XANAX) 0.5 MG tablet TAKE ONE TABLET BY MOUTH AT BEDTIME AS NEEDED FOR ANXIETY 90 tablet 1  . B Complex-C (B-COMPLEX WITH VITAMIN C) tablet Take 1 tablet by mouth daily.    . carvedilol (COREG) 25 MG tablet TAKE 1 TABLET BY MOUTH TWICE A DAY WITH A MEAL, MUST HAVE FOLLOW UP APPOINTMENT FOR FURTHER REFILLS 60 tablet 6  . Cholecalciferol (VITAMIN D3) 5000 UNITS CAPS Take 1 capsule by mouth daily.     . Coenzyme Q10 (CO Q-10) 100 MG CAPS Take 100 mg by mouth daily.    . Cyanocobalamin 2500 MCG SUBL Place 2,500 mcg under the tongue daily.    . digoxin (LANOXIN) 0.125 MG tablet Take 0.5 tablets (0.0625 mg total) by mouth daily. 30 tablet 6  . divalproex (DEPAKOTE ER) 250 MG 24 hr tablet TAKE 1 TABLET (250 MG TOTAL) BY MOUTH DAILY. 90 tablet 2  . GLUCOSAMINE-CHONDROITIN  PO Take 1,200 mg by mouth daily.    Marland Kitchen loratadine (CLARITIN) 10 MG tablet Take 10 mg by mouth daily.    . methocarbamol (ROBAXIN) 750 MG tablet Take 750 mg by mouth 2 (two) times daily.     . metolazone (ZAROXOLYN) 2.5 MG tablet TAKE 1 TABLET (2.5 MG TOTAL) BY MOUTH AS DIRECTED. 5 tablet 1  . mometasone-formoterol (DULERA) 100-5 MCG/ACT AERO Inhale 2 puffs into the lungs 2 (two) times daily. 1 Inhaler 5  . montelukast (SINGULAIR) 10 MG tablet Take 1 tablet (10 mg total) by mouth at bedtime. 30 tablet 5  . Multiple Vitamin (MULTIVITAMIN WITH MINERALS) TABS Take 1 tablet by mouth daily.    Marland Kitchen oxyCODONE-acetaminophen (PERCOCET) 10-325 MG per tablet Take 1 tablet by mouth every 4 (four) hours as needed. For pain    . PATADAY 0.2 % SOLN     . pentosan polysulfate (ELMIRON) 100 MG capsule Take 100 mg by mouth 2 (two) times daily.    . potassium chloride SA (K-DUR,KLOR-CON) 20 MEQ tablet Take 20 mEq by mouth daily. With fluid pill    . pregabalin (LYRICA) 200 MG capsule Take 600 mg by mouth at bedtime.     Marland Kitchen PRODIGY NO CODING BLOOD GLUC test strip     . RABEprazole (ACIPHEX) 20 MG tablet Take 20 mg by mouth daily.      Marland Kitchen spironolactone (ALDACTONE) 25 MG tablet Take 0.5 tablets (12.5 mg total) by mouth daily. 15 tablet 6  . torsemide (DEMADEX) 20 MG tablet AS NEEDED FOR FLUID RETENTION TO BE TAKEN WITH POTASSIUM    . zolpidem (AMBIEN CR) 12.5 MG CR tablet Take 12.5 mg by mouth.      No current facility-administered medications for this visit.    OBJECTIVE: Young African-American woman in no acute distress Filed Vitals:   05/29/14 1543  BP: 107/66  Pulse: 93  Temp: 98.4 F (36.9 C)  Resp: 18     Body mass index is 37.28 kg/(m^2).    ECOG FS:2 - Symptomatic, <50% confined to bed  Ocular: Sclerae unicteric, pupils equal, round and reactive to light Ear-nose-throat: Oropharynx clear, no thrush or other lesions noted Lymphatic: No cervical or supraclavicular adenopathy Lungs no rales or rhonchi,  good excursion bilaterally Heart regular rate and rhythm, no murmur appreciated Abd soft,  obese,  nontender, positive bowel sounds MSK no focal spinal tenderness, no joint edema Neuro: non-focal, well-oriented,  appropriate affect Breasts: The right breast is status post lumpectomy and radiation. There is no evidence of local recurrence. The right axilla is benign. The left breast is unremarkable   LAB RESULTS:  CMP     Component Value Date/Time   NA 135* 05/17/2014 1047   K 4.7 05/17/2014 1047   CL 97 05/17/2014 1047   CO2 23 05/17/2014 1047   GLUCOSE 80 05/17/2014 1047   BUN 13 05/17/2014 1047   CREATININE 0.90 05/17/2014 1047   CREATININE 0.77 08/25/2012 1706   CALCIUM 9.4 05/17/2014 1047   PROT 8.1 06/02/2012 1110   ALBUMIN 4.1 06/02/2012 1110   AST 43* 06/02/2012 1110   ALT 51* 06/02/2012 1110   ALKPHOS 95 06/02/2012 1110   BILITOT 0.5 06/02/2012 1110   GFRNONAA 77* 05/17/2014 1047   GFRAA 89* 05/17/2014 1047    INo results found for: SPEP, UPEP  Lab Results  Component Value Date   WBC 10.6* 05/24/2013   NEUTROABS 6.9 05/24/2013   HGB 12.3 05/24/2013   HCT 38.9 05/24/2013   MCV 85.0 05/24/2013   PLT 246.0 05/24/2013      Chemistry      Component Value Date/Time   NA 135* 05/17/2014 1047   K 4.7 05/17/2014 1047   CL 97 05/17/2014 1047   CO2 23 05/17/2014 1047   BUN 13 05/17/2014 1047   CREATININE 0.90 05/17/2014 1047   CREATININE 0.77 08/25/2012 1706      Component Value Date/Time   CALCIUM 9.4 05/17/2014 1047   ALKPHOS 95 06/02/2012 1110   AST 43* 06/02/2012 1110   ALT 51* 06/02/2012 1110   BILITOT 0.5 06/02/2012 1110       Lab Results  Component Value Date   LABCA2 24 08/19/2011    No components found for: [id number]  No results for input(s): INR in the last 168 hours.  Urinalysis    Component Value Date/Time   COLORURINE YELLOW 05/02/2008 1222   APPEARANCEUR CLEAR 05/02/2008 1222   LABSPEC 1.008 05/02/2008 1222   PHURINE 6.0  05/02/2008 1222   GLUCOSEU NEGATIVE 05/02/2008 1222   HGBUR TRACE* 05/02/2008 1222   [place] 05/02/2008 1222   KETONESUR NEGATIVE 05/02/2008 1222   PROTEINUR NEGATIVE 05/02/2008 1222   UROBILINOGEN 0.2 05/02/2008 1222   NITRITE NEGATIVE 05/02/2008 1222   LEUKOCYTESUR NEGATIVE 05/02/2008 1222    STUDIES: 4 reasons not related to her history of breast cancer, [name] had a CT of the abdomen and pelvis 07/11/2013, a noncontrast head CT 05/03/2013, and a chest x-ray 01/03/2014, none of which show evidence of recurrent disease. I do not find mammography after March 2013 although the patient feels sure she had mammography sometime this year  ASSESSMENT: 43 y.o.  BRCA negative [place] woman   (1) status post right lumpectomy and sentinel lymph node sampling 08/22/2006 for a pT2 pN1, stage IIB invasive ductal carcinoma, grade 3, estrogen receptor 99% positive, progesterone receptor 98% positive, with an MIB-1 of 16% and no HER-2 amplification   (2) completion axillary dissection 09/07/2006 showed no additional positive lymph nodes (total of 17 lymph nodes sampled, one positive)  (3) adjuvant chemotherapy followed ECOG 10/12/2001, arm D   (a) started 10/21/2006 and consisted of cyclophosphamide and doxorubicin given in dose dense fashion 4, completed 12/02/2006 (total doxorubicin dose 240 mg/m), followed by   (b) weekly paclitaxel 12 together with either bevacizumab or placebo given every 3 weeks,  completed 03/03/2007    (c) bevacizumab was to have been continued, but protocol-mandated echocardiogram 03/06/2007 showed the patient's previously normal left ventricular ejection fraction to have dropped to 35%.  (4)  completed radiation treatment to the right breast 05/17/2007-- 5040 cGy plus a 1260 cGy boost  (5) received letrozole between December 2008 and March 2013  (6) severe cardiomyopathy with most recent echocardiogram 09/03/2013 showing an ejection fraction  20 -25%  (a)  ICD implanted May 2013  (7) fibromyalgia, with chronic pain and some residual grade 1 neuropathy from chemotherapy   (8) sleep apnea documented through polysomnography 07/24/2013  (9) conversion disorder with no evidence of myotonia or myotonic dystrophy on exam at [facility] (under Rushie Goltz MD 11/15/2013)     PLAN:  I spent approximately one hour today with [name] and her husband going over her situation, and reviewing her diagnostic and treatment history. She was treated appropriately under protocol and she is now nearly 8 years out from her definitive surgery with no evidence of disease recurrence. She may well be cured of her locally advanced breast cancer.  On the other hand she was left with significant morbidities. She has excellent follow-up for this through cardiology, neurology, and through primary care. Unfortunately there is little we can do from an oncology point of view to improve her situation.  I reassured her that I normally stop aromatase inhibitors at 5 years, since we still do not have data that continuing beyond 5 years is useful. I have made myself available to her but in terms of follow-up the plan will be for her to see me once a year for an additional 2 years until she completes a total of 10 years in follow-up, at which time she will "graduate".  The patient has a good understanding of the overall plan. She agrees with it. She knows the goal of treatment in her case is cure. She will call with any problems that may develop before her next visit here.  Chauncey Cruel, MD   05/29/2014 7:14 PM Medical Oncology and Hematology [facility] [address] [place], [place] [address] Tel. [phone]    Fax. [phone]

## 2014-05-29 NOTE — Telephone Encounter (Signed)
, °

## 2014-05-31 NOTE — Addendum Note (Signed)
Addended by: Laureen Abrahams on: 05/31/2014 05:36 PM   Modules accepted: Medications

## 2014-06-24 ENCOUNTER — Encounter (HOSPITAL_COMMUNITY): Payer: Self-pay

## 2014-06-24 ENCOUNTER — Ambulatory Visit (HOSPITAL_BASED_OUTPATIENT_CLINIC_OR_DEPARTMENT_OTHER)
Admission: RE | Admit: 2014-06-24 | Discharge: 2014-06-24 | Disposition: A | Discharge status: Home / Self Care | Payer: Medicare Other | Source: Ambulatory Visit | Attending: Internal Medicine | Admitting: Internal Medicine

## 2014-06-24 ENCOUNTER — Ambulatory Visit (HOSPITAL_COMMUNITY)
Admission: RE | Admit: 2014-06-24 | Discharge: 2014-06-24 | Disposition: A | Discharge status: Home / Self Care | Payer: Medicare Other | Source: Ambulatory Visit | Attending: Internal Medicine | Admitting: Internal Medicine

## 2014-06-24 VITALS — BP 92/60 | HR 83 | Wt 232.0 lb

## 2014-06-24 DIAGNOSIS — J449 Chronic obstructive pulmonary disease, unspecified: Secondary | ICD-10-CM | POA: Diagnosis not present

## 2014-06-24 DIAGNOSIS — I427 Cardiomyopathy due to drug and external agent: Secondary | ICD-10-CM | POA: Diagnosis not present

## 2014-06-24 DIAGNOSIS — F449 Dissociative and conversion disorder, unspecified: Secondary | ICD-10-CM | POA: Insufficient documentation

## 2014-06-24 DIAGNOSIS — G620 Drug-induced polyneuropathy: Secondary | ICD-10-CM | POA: Diagnosis not present

## 2014-06-24 DIAGNOSIS — E119 Type 2 diabetes mellitus without complications: Secondary | ICD-10-CM | POA: Insufficient documentation

## 2014-06-24 DIAGNOSIS — R5381 Other malaise: Secondary | ICD-10-CM | POA: Insufficient documentation

## 2014-06-24 DIAGNOSIS — Z95810 Presence of automatic (implantable) cardiac defibrillator: Secondary | ICD-10-CM | POA: Insufficient documentation

## 2014-06-24 DIAGNOSIS — R0600 Dyspnea, unspecified: Secondary | ICD-10-CM | POA: Diagnosis not present

## 2014-06-24 DIAGNOSIS — M797 Fibromyalgia: Secondary | ICD-10-CM | POA: Diagnosis not present

## 2014-06-24 DIAGNOSIS — C50911 Malignant neoplasm of unspecified site of right female breast: Secondary | ICD-10-CM | POA: Diagnosis not present

## 2014-06-24 DIAGNOSIS — J45909 Unspecified asthma, uncomplicated: Secondary | ICD-10-CM | POA: Insufficient documentation

## 2014-06-24 DIAGNOSIS — I5022 Chronic systolic (congestive) heart failure: Secondary | ICD-10-CM | POA: Diagnosis not present

## 2014-06-24 DIAGNOSIS — F329 Major depressive disorder, single episode, unspecified: Secondary | ICD-10-CM | POA: Insufficient documentation

## 2014-06-24 DIAGNOSIS — I509 Heart failure, unspecified: Secondary | ICD-10-CM | POA: Diagnosis present

## 2014-06-24 DIAGNOSIS — I369 Nonrheumatic tricuspid valve disorder, unspecified: Secondary | ICD-10-CM

## 2014-06-24 DIAGNOSIS — I10 Essential (primary) hypertension: Secondary | ICD-10-CM | POA: Insufficient documentation

## 2014-06-24 DIAGNOSIS — Z79899 Other long term (current) drug therapy: Secondary | ICD-10-CM | POA: Insufficient documentation

## 2014-06-24 DIAGNOSIS — Z9221 Personal history of antineoplastic chemotherapy: Secondary | ICD-10-CM | POA: Insufficient documentation

## 2014-06-24 NOTE — Progress Notes (Signed)
  Echocardiogram 2D Echocardiogram has been performed.  [name] 06/24/2014, 12:17 PM

## 2014-06-24 NOTE — Patient Instructions (Signed)
Will refer you for home health physical therapy. [facility] will call you to set up initial appointment in your home.  Will refer you for Neuro evaluation with our [facility] team. They will call you for this appointment.  Follow up in 2 months. We will call you closer to time to schedule this.  Do the following things EVERYDAY: 1) Weigh yourself in the morning before breakfast. Write it down and keep it in a log. 2) Take your medicines as prescribed 3) Eat low salt foods-Limit salt (sodium) to 2000 mg per day.  4) Stay as active as you can everyday 5) Limit all fluids for the day to less than 2 liters

## 2014-06-24 NOTE — Progress Notes (Signed)
Patient ID: [name], female   DOB: 21-Mar-1971, 44 y.o.   MRN: [id number]  Primary Cardiologist:  Dr. Arvilla Meres Neurlogist: Doh[place]   PCP: Dr. Consuella Lose Griffin/Noel Redmon, PAC   History of Present Illness: [name] is a 44 y.o. female with a history of morbid obesity, depression, fibromyalgia, congestive heart failure secondary to nonischemic cardiomyopathy related to her chemotherapy for breast cancer. Last chemo 2008. Evaluated by neuro for stuttering.  Felt to have a conversion disorder.   CPX Feb 2010. Peak VO2 was 14.5 which was 71% of predicted. When corrected for body weight the VO2 was 20.7. The slope was 27. RER 1.20. O2 pulse 73%. Overall this was felt to be only a very mild functional limitation due to her obesity and mild circulatory limitation.  CPX 2/11: pVO2 13.0 (72%) correct for ideal wt 76ml/kg/min RER 1.06 (submax) slope 28.2 O2 pulse 91% - felt no signifcant cardiac limitation. + deconditioning.   Echo 04/2009  EF back down to 25%.  RHC normal. Echo 07/2009 showed EF 50%  Echo 06/2010 EF 40%. So we did MUGA EF 58% Echo 12/2010 40-45% Echo 05/2011 EF 30%. Grade 2 diastolic dysfunction Cath (12/28/10) was set up and demonstrated EF 20-25% and normal coronary arteries.  RA  4, RV 35/11, PA  28/8 (17), PCWP 8. Fick 5.2/2.4 PVR 1.7 Woods. Fick 5.2/2.4 Aortic saturation was 97%.  PA saturation was 67% and 68%. ECHO 09/03/13 EF 20-25%   Cardiac MRI on 07/19/11: Moderate to severe LVE, Diffuse hypokinesis. EF 32%, Mild LAE  She returns for follow up. Evaluated by Neurology at [facility] in June they confirmed conversion disorder. She continues to struggle with conversion disorder.  Evaluated by pulmonary August 12 and Dr Vassie Loll told he saw no evidence of asthma.  Pending evaluation with [facility] team.   At last visit digoxin decreased due to elevated level. Weight down 6 pounds.  Taking all medications. She is taking torsemide 20 mg daily 2-3 times a week when her belly  feels full or left foot swells. Says she takes metolazone 1-2 times a month. Breathing better unless fluid level up. Still gets dyspnea with minimal exertion. Struggles with ADLs.   Optivol today reviewed personally: Fluid index well below threshold. Activity < 1 hour per day. NO VT/VF   Labs  09/03/13 k 3.3 Creatinine 0.86 Pro BNP 998 Dig level 0.7 05/17/14 K 4.7 Creatinine 0.9 pBNP 1007 digoxin 1.2   SH: lived with her husband. Does not drive. Does not drink alcohol or smoke    Past Medical History  Diagnosis Date  . 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 Cardiac MRI 05/2011 EF 32%. ICD implantation 10/2011 (Medtronic)  . Peripheral neuropathy     chemo- induced  . Hypertension     c/b orthostatic hypotention  . Depression   . Palpitation     normal sinus rhythm only on 21 day heart monitor  . Obesity   . Fibromyalgia   . GERD (gastroesophageal reflux disease)   . Heart murmur   . Breast calcifications     right breast  . Interstitial cystitis   . Anxiety   . Nonischemic cardiomyopathy     related to chemo; EF 30% 05/2011  . ICD (implantable cardiac defibrillator) in place   . Pneumonia 2000's    "once"  . Type II diabetes mellitus   . Blood transfusion 1980's  1987 or 1988  . Anemia 1980's    [id number]  . History of stomach ulcers   . Breast cancer     right;  . Conversion disorder   . 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   . 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 albuterol (PROAIR HFA) 108 (90 BASE) MCG/ACT inhaler Inhale 1-2 puffs into the lungs every 6 (six) hours as needed for wheezing or shortness of breath. Pt needs appt for  more refills 8.5 each 0  . ALPRAZolam (XANAX) 0.5 MG tablet TAKE ONE TABLET BY MOUTH AT BEDTIME AS NEEDED FOR ANXIETY 90 tablet 1  . B Complex-C (B-COMPLEX WITH VITAMIN C) tablet Take 1 tablet by mouth daily.    . carvedilol (COREG) 25 MG tablet TAKE 1 TABLET BY MOUTH TWICE A DAY WITH A MEAL, MUST HAVE FOLLOW UP APPOINTMENT FOR FURTHER REFILLS 60 tablet 6  . Cholecalciferol (VITAMIN D3) 5000 UNITS CAPS Take 1 capsule by mouth daily.     . Coenzyme Q10 (CO Q-10) 100 MG CAPS Take 100 mg by mouth daily.    . Cyanocobalamin 2500 MCG SUBL Place 2,500 mcg under the tongue daily.    . digoxin (LANOXIN) 0.125 MG tablet Take 0.5 tablets (0.0625 mg total) by mouth daily. 30 tablet 6  . divalproex (DEPAKOTE ER) 250 MG 24 hr tablet TAKE 1 TABLET (250 MG TOTAL) BY MOUTH DAILY. 90 tablet 2  . GLUCOSAMINE-CHONDROITIN PO Take 1,200 mg by mouth daily.    Marland Kitchen loratadine (CLARITIN) 10 MG tablet Take 10 mg by mouth daily.    . methocarbamol (ROBAXIN) 750 MG tablet Take 750 mg by mouth 2 (two) times daily.     . metolazone (ZAROXOLYN) 2.5 MG tablet TAKE 1 TABLET (2.5 MG TOTAL) BY MOUTH AS DIRECTED. 5 tablet 1  . mometasone-formoterol (DULERA) 100-5 MCG/ACT AERO Inhale 2 puffs into the lungs 2 (two) times daily. 1 Inhaler 5  . montelukast (SINGULAIR) 10 MG tablet Take 1 tablet (10 mg total) by mouth at bedtime. 30 tablet 5  . Multiple Vitamin (MULTIVITAMIN WITH MINERALS) TABS Take 1 tablet by mouth daily.    Marland Kitchen oxyCODONE-acetaminophen (PERCOCET) 10-325 MG per tablet Take 1 tablet by mouth every 4 (four) hours as needed. For pain    . PATADAY 0.2 % SOLN     . pentosan polysulfate (ELMIRON) 100 MG capsule Take 100 mg by mouth 2 (two) times daily.    . potassium chloride SA (K-DUR,KLOR-CON) 20 MEQ tablet Take 20 mEq by mouth daily. With fluid pill    . pregabalin (LYRICA) 200 MG capsule Take 600 mg by mouth at bedtime.     Marland Kitchen PRODIGY NO CODING BLOOD GLUC test strip     . RABEprazole (ACIPHEX) 20 MG tablet Take 20 mg by  mouth daily.      Marland Kitchen spironolactone (ALDACTONE) 25 MG tablet Take 0.5 tablets (12.5 mg total) by mouth daily. 15 tablet 6  . torsemide (DEMADEX) 20 MG tablet AS NEEDED FOR FLUID RETENTION TO BE TAKEN WITH POTASSIUM    . zolpidem (AMBIEN CR) 12.5 MG CR tablet Take 12.5 mg by mouth.      No current facility-administered medications on file prior to encounter.     Allergies  Allergen Reactions  . Other Rash    Chloraprep  .  Reglan [Metoclopramide Hcl] Anxiety   ROS: All pertinent positives or negatives as in HPI otherwise negative   Vital Signs: Filed Vitals:   06/24/14 1215  BP: 84/62  Pulse: 83  Weight: 232 lb (105.235 kg)  SpO2: 94%   PHYSICAL EXAM: Well nourished, well developed, in no acute distress Mom and Husband present  HEENT: normal Neck: JVP flat Cardiac:  Lateral PMI, normal S1, S2; RRR; no murmur, No S3L upper chest scar ICD Lungs:  clear to auscultation bilaterally, no wheezing, rhonchi or rales Abd: obese, soft, nontender, nondistended Ext: No lower extremity edema.    Skin: warm and dry Neuro:  CNs 2-12 intact, no focal abnormalities noted Psych: Normal affect  ASSESSMENT AND PLAN: 1. Chronic systolic CHF: Nonischemic cardiomyopathy.  Medtronic ICD. I reviewed echo today personally and EF remains 20-25% with moderately dilated LV.  NYHA III-IIIB. I don't think all of her functional limitation is due to HF. She is severely deconditioned and depressed and is not motivated to be more active. After long discussion she finally agreed to home PT evaluation.  - ICD interrogated. No AF/VT. Optivol flat. Activity index < 1 hr per day.  - Volume status stable which  coincides with optivol.  -On goal dose carvedilol 25 mg twice a day and dig at current dose. - At one point she was supposed start losartan but had hyperkalemia from the last lab work 08/2013 so this was not started.  - BP too low to add or titrate. - Will start ivabradine 5mg  bid pending insurance approval 2.  Conversion disorder: Followed by neurology at [facility]   --She would like evaluation at [facility] we will call 3. Morbid obesity: Needs to work at diet/exercise for weight loss.  4. Deconditioning - She is very debilitated. Refuses to try cardiac rehab again. Will send PT to the house to evaluate and treat to help with her functional status.   Myka Hitz,MD 12:29 PM

## 2014-06-25 ENCOUNTER — Telehealth: Payer: Self-pay | Admitting: Licensed Clinical Social Worker

## 2014-06-25 NOTE — Telephone Encounter (Signed)
CSW referred to assist with PCP. CSW contacted patient via phone. Patient states that she is in need of an primary care provider. CSW discussed various options and insurance. Patient interested in [facility] as she has pending appointments with other specialist in [place]. CSW contacted [place] and patient scheduled for PCP visit on July 08, 2014 at 3:30pm. Patient also mentioned that she has multiple unpaid medical bills. CSW discussed application for medicaid deductible program. Patient reports she has Medicare and SSD income but limited with income to meet the uncovered medicare expenses. Patient verbalizes understanding of follow up and will seek application at [employer] for [employer] program. CSW available if further needs arise. Raquel Sarna, [place]

## 2014-06-28 ENCOUNTER — Telehealth (HOSPITAL_COMMUNITY): Payer: Self-pay | Admitting: Vascular Surgery

## 2014-06-28 NOTE — Telephone Encounter (Signed)
[facility] Physical Therapist called  they have been trying to contact pt by phone and she went by her home. Pt has has not returned calls.. She is now marked as a non admit.Marland Kitchen

## 2014-07-01 NOTE — Telephone Encounter (Signed)
Dr Haroldine Laws is aware

## 2014-07-02 ENCOUNTER — Other Ambulatory Visit: Payer: Self-pay | Admitting: Neurology

## 2014-07-03 NOTE — Telephone Encounter (Signed)
I tried to call patient to see if she is seeing a different MD, got no answer.  Left message.

## 2014-07-08 ENCOUNTER — Encounter: Payer: Self-pay | Admitting: Internal Medicine

## 2014-07-08 ENCOUNTER — Ambulatory Visit (INDEPENDENT_AMBULATORY_CARE_PROVIDER_SITE_OTHER): Payer: Medicare Other | Admitting: Family

## 2014-07-08 ENCOUNTER — Encounter: Payer: Self-pay | Admitting: Family

## 2014-07-08 VITALS — BP 110/64 | HR 90 | Temp 98.7°F | Resp 18 | Ht 67.0 in | Wt 234.0 lb

## 2014-07-08 DIAGNOSIS — F449 Dissociative and conversion disorder, unspecified: Secondary | ICD-10-CM

## 2014-07-08 DIAGNOSIS — I5022 Chronic systolic (congestive) heart failure: Secondary | ICD-10-CM | POA: Diagnosis not present

## 2014-07-08 DIAGNOSIS — F411 Generalized anxiety disorder: Secondary | ICD-10-CM | POA: Diagnosis not present

## 2014-07-08 DIAGNOSIS — M797 Fibromyalgia: Secondary | ICD-10-CM

## 2014-07-08 MED ORDER — PENTOSAN POLYSULFATE SODIUM 100 MG PO CAPS: 100.0000 mg | ORAL_CAPSULE | Freq: Two times a day (BID) | ORAL | Status: DC

## 2014-07-08 MED ORDER — BACLOFEN 10 MG PO TABS: 10.0000 mg | ORAL_TABLET | Freq: Two times a day (BID) | ORAL | Status: DC | PRN

## 2014-07-08 MED ORDER — FLUCONAZOLE 150 MG PO TABS: 150.0000 mg | ORAL_TABLET | Freq: Once | ORAL | Status: DC

## 2014-07-08 MED ORDER — OMEPRAZOLE 20 MG PO CPDR: 20.0000 mg | DELAYED_RELEASE_CAPSULE | Freq: Every day | ORAL | Status: DC

## 2014-07-08 MED ORDER — NYSTATIN 100000 UNIT/ML MT SUSP: 5.0000 mL | Freq: Four times a day (QID) | OROMUCOSAL | Status: DC

## 2014-07-08 MED ORDER — ALPRAZOLAM 1 MG PO TABS: 1.0000 mg | ORAL_TABLET | Freq: Every day | ORAL | Status: DC

## 2014-07-08 NOTE — Assessment & Plan Note (Signed)
Currently remains stable and maintained by Cardiology. Continue current carvedilol, digoxin,  Spironolactone, and torsemide as previously prescribed. Follow up with cardiology as needed.

## 2014-07-08 NOTE — Assessment & Plan Note (Signed)
Uncontrolled and has failed multiple classes of medications including SSRIs and SNRIs. Concern with patient desire to increase Xanax to 1 mg as needed at bedtime on top of other medications. Discussed these concerns with the patient and she continues to insist this will work for her. Increase Xanax to 1 mg at bedtime. Patient instructed to only use as she absolutely needs it. Follow up in one month.

## 2014-07-08 NOTE — Progress Notes (Signed)
Pre visit review using our clinic review tool, if applicable. No additional management support is needed unless otherwise documented below in the visit note. 

## 2014-07-08 NOTE — Assessment & Plan Note (Addendum)
Uncontrolled at this time. Pain clinic management is awaiting referral to neuropsychology for management of conversion disorder. Indicates that she is out of medication. Patient is instructed to contact the pain clinic to determine refills and if refills can be made while awaiting neuropsychology appointment. Discontinue robaxin and start Baclofen. Continue current Percocet and Lyrica as previously prescribed. Referral to neuropsychology made.

## 2014-07-08 NOTE — Progress Notes (Signed)
Subjective:    Patient ID: [name], female    DOB: Oct 19, 1970, 44 y.o.   MRN: [id number]  Chief Complaint  Patient presents with  . Establish Care    Says she just wants someone who will check on her when needed with her diagnosis,     HPI:  [name] is a 44 y.o. female with a history of hypertension, systolic heart failure, fibromyalgia, and breast cancer who presents today to establish care and discuss her multiple medical conditions.  1) Chronic systolic heart failure - currently stable and maintained with Dr. Haroldine Laws.   2) Chronic pain/Fibromyalgia - Indicates that she has had a significant amount of dull/achy pain secondary to the fibromyalgia. States the pain is located "all over" and definitely in her shoulder and neck. Currently using Lyrica and Percocet to manage her pain which seem to help; Patient was seen by a pain specialist and stopped seeing them because she is needing to be seen by a neuropsychologist. The pain can become so intense that it may cause her to pass out at times.    3) Anxiety - Indicates she feels nervious and panic attack like symptoms that keep her up at night. States she believes that she feels like something is going to happen. Previously maintained lorazepam then transferred to alprazolam but indicates that she needs at least 1 mg of Xanax.  Has been previously been tried on multiple SSRI's and SNRIs with no success.    Allergies  Allergen Reactions  . Other Rash    Chloraprep  . Reglan [Metoclopramide Hcl] Anxiety    Current Outpatient Prescriptions on File Prior to Visit  Medication Sig Dispense Refill  . albuterol (PROAIR HFA) 108 (90 BASE) MCG/ACT inhaler Inhale 1-2 puffs into the lungs every 6 (six) hours as needed for wheezing or shortness of breath. Pt needs appt for more refills 8.5 each 0  . B Complex-C (B-COMPLEX WITH VITAMIN C) tablet Take 1 tablet by mouth daily.    . carvedilol (COREG) 25 MG tablet TAKE 1 TABLET BY  MOUTH TWICE A DAY WITH A MEAL, MUST HAVE FOLLOW UP APPOINTMENT FOR FURTHER REFILLS 60 tablet 6  . Cholecalciferol (VITAMIN D3) 5000 UNITS CAPS Take 1 capsule by mouth daily.     . Coenzyme Q10 (CO Q-10) 100 MG CAPS Take 100 mg by mouth daily.    . Cyanocobalamin 2500 MCG SUBL Place 2,500 mcg under the tongue daily.    . digoxin (LANOXIN) 0.125 MG tablet Take 0.5 tablets (0.0625 mg total) by mouth daily. 30 tablet 6  . divalproex (DEPAKOTE ER) 250 MG 24 hr tablet TAKE 1 TABLET (250 MG TOTAL) BY MOUTH DAILY. 90 tablet 2  . divalproex (DEPAKOTE ER) 250 MG 24 hr tablet TAKE 1 TABLET (250 MG TOTAL) BY MOUTH DAILY. 30 tablet 0  . GLUCOSAMINE-CHONDROITIN PO Take 1,200 mg by mouth daily.    Marland Kitchen loratadine (CLARITIN) 10 MG tablet Take 10 mg by mouth daily.    . methocarbamol (ROBAXIN) 750 MG tablet Take 750 mg by mouth 2 (two) times daily.     . metolazone (ZAROXOLYN) 2.5 MG tablet TAKE 1 TABLET (2.5 MG TOTAL) BY MOUTH AS DIRECTED. 5 tablet 1  . mometasone-formoterol (DULERA) 100-5 MCG/ACT AERO Inhale 2 puffs into the lungs 2 (two) times daily. 1 Inhaler 5  . montelukast (SINGULAIR) 10 MG tablet Take 1 tablet (10 mg total) by mouth at bedtime. 30 tablet 5  . Multiple Vitamin (MULTIVITAMIN WITH MINERALS) TABS  Take 1 tablet by mouth daily.    Marland Kitchen oxyCODONE-acetaminophen (PERCOCET) 10-325 MG per tablet Take 1 tablet by mouth every 4 (four) hours as needed. For pain    . PATADAY 0.2 % SOLN     . potassium chloride SA (K-DUR,KLOR-CON) 20 MEQ tablet Take 20 mEq by mouth daily. With fluid pill    . pregabalin (LYRICA) 200 MG capsule Take 600 mg by mouth at bedtime.     Marland Kitchen PRODIGY NO CODING BLOOD GLUC test strip     . spironolactone (ALDACTONE) 25 MG tablet Take 0.5 tablets (12.5 mg total) by mouth daily. 15 tablet 6  . torsemide (DEMADEX) 20 MG tablet AS NEEDED FOR FLUID RETENTION TO BE TAKEN WITH POTASSIUM    . zolpidem (AMBIEN CR) 12.5 MG CR tablet Take 12.5 mg by mouth.      No current facility-administered  medications on file prior to visit.    Past Medical History  Diagnosis Date  . 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 Cardiac MRI 05/2011 EF 32%. ICD implantation 10/2011 (Medtronic)  . Peripheral neuropathy     chemo- induced  . Hypertension     c/b orthostatic hypotention  . Depression   . Palpitation     normal sinus rhythm only on 21 day heart monitor  . Obesity   . Fibromyalgia   . GERD (gastroesophageal reflux disease)   . Heart murmur   . Breast calcifications     right breast  . Interstitial cystitis   . Anxiety   . Nonischemic cardiomyopathy     related to chemo; EF 30% 05/2011  . ICD (implantable cardiac defibrillator) in place   . Pneumonia 2000's    "once"  . Type II diabetes mellitus   . Blood transfusion 1980's    1987 or 1988  . Anemia 1980's    [id number]  . History of stomach ulcers   . Breast cancer     right;  . Conversion disorder   . 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   . 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 Laparoscopic endometriosis fulguration  1990's  . Cardiac defibrillator placement  11/03/11  . Tonsillectomy  1980's  . Tubal ligation  1990's  . Vaginal hysterectomy  2000's  . Cholecystectomy  ~ 2009  . Breast lumpectomy  2008; 2013    right  . Port-a-cath removal  2010?    left chest; placed in 2008  . Nasal sinus surgery    . Implantable cardioverter defibrillator implant N/A 11/03/2011    Procedure: IMPLANTABLE CARDIOVERTER DEFIBRILLATOR IMPLANT;  Surgeon: Deboraha Sprang, MD;  Location[facility] CATH LAB;  Service: Cardiovascular;  Laterality: N/A;    Family History  Problem Relation Age of Onset  . Coronary artery disease    . Heart attack    .  Heart disease Mother   . Heart disease Maternal Uncle   . Cancer Paternal Uncle     colon  . Heart disease Maternal Grandmother   . Cancer Paternal Grandmother     ovarian    History   Social History  . Marital Status: Married    Spouse Name: [name]    Number of Children: 1  . Years of Education: 11  Occupational History  . Disablity    Social History Main Topics  . Smoking status: Never Smoker   . Smokeless tobacco: Never Used  . Alcohol Use: Yes     Comment: 11/03/11 "maybe once a month"  . Drug Use: No  . Sexual Activity: Not on file   Other Topics Concern  . Not on file   Social History Narrative   Patient is married [name]) and lives at home with her family.   Patient has one child.   Patient is right-handed.   Patient has a college education.   Patient drinks some caffeine occasionally, but not everyday.   Regular exercise    Review of Systems  Musculoskeletal: Positive for arthralgias and neck pain.  Neurological: Positive for numbness and headaches.  Psychiatric/Behavioral: The patient is nervous/anxious.       Objective:    BP 110/64 mmHg  Pulse 90  Temp(Src) 98.7 F (37.1 C) (Oral)  Resp 18  Ht 5\' 7"  (1.702 m)  Wt 234 lb (106.142 kg)  BMI 36.64 kg/m2  SpO2 98% Nursing note and vital signs reviewed.  Physical Exam  Constitutional: She is oriented to person, place, and time. She appears well-developed and well-nourished. No distress.  Cardiovascular: Normal rate, regular rhythm, normal heart sounds and intact distal pulses.   Pulmonary/Chest: Effort normal and breath sounds normal.  Neurological: She is alert and oriented to person, place, and time.  Skin: Skin is warm and dry.  Psychiatric: Her speech is normal and behavior is normal. Judgment and thought content normal. Her mood appears anxious.       Assessment & Plan:

## 2014-07-08 NOTE — Patient Instructions (Signed)
Thank you for choosing [facility].  Summary/Instructions:  Your prescription(s) have been submitted to your pharmacy or been printed and provided for you. Please take as directed and contact our office if you believe you are having problem(s) with the medication(s) or have any questions.  If your symptoms worsen or fail to improve, please contact our office for further instruction, or in case of emergency go directly to the emergency room at the closest medical facility.   Follow up with pain clinic regarding medications.   Follow up with Dr. Beacher May for your neurology needs.

## 2014-07-12 ENCOUNTER — Telehealth: Payer: Self-pay | Admitting: Family

## 2014-07-12 DIAGNOSIS — F411 Generalized anxiety disorder: Secondary | ICD-10-CM

## 2014-07-12 MED ORDER — ALPRAZOLAM 1 MG PO TABS: 1.0000 mg | ORAL_TABLET | Freq: Every day | ORAL | Status: DC

## 2014-07-12 NOTE — Telephone Encounter (Signed)
Rx ready for pick up. Pt is aware and said it will probably be picked up on Monday

## 2014-07-12 NOTE — Telephone Encounter (Signed)
Patient left xanax RX at the site on accident. i checked the closet up in the front and it has not been dropped off. Can we have this rewritten and leave for patient pickup?   Also, she spoke to greg about referral to pain management dr. She is asking to be referred to [facility] pain management.   Please advise/ call patient

## 2014-07-17 ENCOUNTER — Telehealth: Payer: Self-pay | Admitting: Internal Medicine

## 2014-07-17 ENCOUNTER — Telehealth (HOSPITAL_COMMUNITY): Payer: Self-pay | Admitting: Vascular Surgery

## 2014-07-17 DIAGNOSIS — R5381 Other malaise: Secondary | ICD-10-CM

## 2014-07-17 NOTE — Telephone Encounter (Signed)
Informed patient that transmission was received. I offered her an appt w/Laura Dorene Ar, NP for this pt, but pt declined due to transportation issues. Will forward information to Dr.Bensimohn's office. Patient voiced understanding.  Transmission faxed to Dr.Bensimohn's ofc w/ATTN to [facility].

## 2014-07-17 NOTE — Telephone Encounter (Signed)
Pt called she wants her top call her concerning a referral [facility], want to talk to you about fluid level.. Please advise

## 2014-07-17 NOTE — Telephone Encounter (Signed)
Patient states that she has noticed a retention of fluid x 1 week. Uses PRN Metolazone in addition to her Torsemide---per pt this has been ineffective. Plan to have pt to send remote for Optivol measurement.

## 2014-07-17 NOTE — Telephone Encounter (Signed)
New message     Pt c/o Shortness Of Breath: STAT if SOB developed within the last 24 hours or pt is noticeably SOB on the phone  1. Are you currently SOB (can you hear that pt is SOB on the phone)? no 2. How long have you been experiencing SOB? 1wk 3. Are you SOB when sitting or when up moving around? both  4. Are you currently experiencing any other symptoms?pt states she has extra fluid. She has gained 7lbs in the last week.  Pt want to check her defib for fluid overload. (Because of possible fluid overload--triage said send to device first)

## 2014-07-18 MED ORDER — TORSEMIDE 20 MG PO TABS: 20.0000 mg | ORAL_TABLET | ORAL | Status: DC

## 2014-07-18 MED ORDER — POTASSIUM CHLORIDE CRYS ER 20 MEQ PO TBCR: 20.0000 meq | EXTENDED_RELEASE_TABLET | ORAL | Status: DC

## 2014-07-18 NOTE — Telephone Encounter (Signed)
Spoke w/[facility] referral and ov note faxed to them at [phone] they will review records and call pt with appt

## 2014-07-18 NOTE — Telephone Encounter (Signed)
Spent 25 min on the phone discussing multiple issues with pt 1. Referral to [facility] neuro movement disorder clinic, she has not heard from them yet 2. Need referral to [facility] PT 3. Elevated optivol with edema and SOB 4. Is it safe to take MagO 7 colon cleanse  1. Have called [facility] and left mess for them to call us back, it looks like the referral is in epic 2. New order placed for PT  3. Pt states wt is up some, she does have edema in her feet and is more SOB with activity, she only take torsemide prn and metolazone prn, which she has taken 2 times this week, discussed w/Dr Bensimhon he would like pt to start taking Torsemide 20 mg every Mon, Wed and Fri along with 20 meq of KCL, starting tomorrow along with 1 dose of Metolazone tomorrow AM, pt is aware and verbalizes understanding, if not feeling better by early next week she will call us back 4. Advised pt not take the MagO 7 she is agreeable

## 2014-07-21 ENCOUNTER — Other Ambulatory Visit: Payer: Self-pay | Admitting: Family

## 2014-07-24 ENCOUNTER — Telehealth (HOSPITAL_COMMUNITY): Payer: Self-pay | Admitting: Vascular Surgery

## 2014-07-24 ENCOUNTER — Encounter: Payer: Self-pay | Admitting: Internal Medicine

## 2014-07-24 DIAGNOSIS — G620 Drug-induced polyneuropathy: Secondary | ICD-10-CM | POA: Diagnosis not present

## 2014-07-24 DIAGNOSIS — E119 Type 2 diabetes mellitus without complications: Secondary | ICD-10-CM | POA: Diagnosis not present

## 2014-07-24 DIAGNOSIS — I427 Cardiomyopathy due to drug and external agent: Secondary | ICD-10-CM | POA: Diagnosis not present

## 2014-07-24 DIAGNOSIS — F449 Dissociative and conversion disorder, unspecified: Secondary | ICD-10-CM | POA: Diagnosis not present

## 2014-07-24 DIAGNOSIS — M797 Fibromyalgia: Secondary | ICD-10-CM | POA: Diagnosis not present

## 2014-07-24 DIAGNOSIS — I5022 Chronic systolic (congestive) heart failure: Secondary | ICD-10-CM | POA: Diagnosis not present

## 2014-07-24 DIAGNOSIS — I10 Essential (primary) hypertension: Secondary | ICD-10-CM | POA: Diagnosis not present

## 2014-07-24 DIAGNOSIS — Z853 Personal history of malignant neoplasm of breast: Secondary | ICD-10-CM | POA: Diagnosis not present

## 2014-07-24 DIAGNOSIS — T451X5S Adverse effect of antineoplastic and immunosuppressive drugs, sequela: Secondary | ICD-10-CM | POA: Diagnosis not present

## 2014-07-24 DIAGNOSIS — G8929 Other chronic pain: Secondary | ICD-10-CM | POA: Diagnosis not present

## 2014-07-24 DIAGNOSIS — J449 Chronic obstructive pulmonary disease, unspecified: Secondary | ICD-10-CM | POA: Diagnosis not present

## 2014-07-24 NOTE — Telephone Encounter (Signed)
[facility]urse from [facility] called need verbal order for nurse consult

## 2014-07-24 NOTE — Telephone Encounter (Signed)
Verbal given for nursing consult

## 2014-07-25 ENCOUNTER — Ambulatory Visit
Admission: RE | Admit: 2014-07-25 | Discharge: 2014-07-25 | Disposition: A | Discharge status: Home / Self Care | Payer: Medicare Other | Source: Ambulatory Visit | Attending: Oncology | Admitting: Oncology

## 2014-07-25 DIAGNOSIS — Z853 Personal history of malignant neoplasm of breast: Secondary | ICD-10-CM | POA: Diagnosis not present

## 2014-07-25 DIAGNOSIS — C50919 Malignant neoplasm of unspecified site of unspecified female breast: Secondary | ICD-10-CM

## 2014-07-25 DIAGNOSIS — R921 Mammographic calcification found on diagnostic imaging of breast: Secondary | ICD-10-CM | POA: Diagnosis not present

## 2014-07-26 ENCOUNTER — Ambulatory Visit (INDEPENDENT_AMBULATORY_CARE_PROVIDER_SITE_OTHER): Payer: Medicare Other | Admitting: Licensed Clinical Social Worker

## 2014-07-26 DIAGNOSIS — F332 Major depressive disorder, recurrent severe without psychotic features: Secondary | ICD-10-CM | POA: Diagnosis not present

## 2014-08-08 ENCOUNTER — Encounter: Payer: Self-pay | Admitting: Internal Medicine

## 2014-08-15 DIAGNOSIS — M797 Fibromyalgia: Secondary | ICD-10-CM | POA: Diagnosis not present

## 2014-08-15 DIAGNOSIS — G620 Drug-induced polyneuropathy: Secondary | ICD-10-CM | POA: Diagnosis not present

## 2014-08-15 DIAGNOSIS — I427 Cardiomyopathy due to drug and external agent: Secondary | ICD-10-CM | POA: Diagnosis not present

## 2014-08-15 DIAGNOSIS — I5022 Chronic systolic (congestive) heart failure: Secondary | ICD-10-CM | POA: Diagnosis not present

## 2014-08-15 DIAGNOSIS — F449 Dissociative and conversion disorder, unspecified: Secondary | ICD-10-CM | POA: Diagnosis not present

## 2014-08-15 DIAGNOSIS — T451X5S Adverse effect of antineoplastic and immunosuppressive drugs, sequela: Secondary | ICD-10-CM | POA: Diagnosis not present

## 2014-08-18 ENCOUNTER — Other Ambulatory Visit: Payer: Self-pay | Admitting: Pulmonary Disease

## 2014-08-19 ENCOUNTER — Other Ambulatory Visit: Payer: Self-pay | Admitting: Family

## 2014-08-19 ENCOUNTER — Telehealth: Payer: Self-pay | Admitting: Family

## 2014-08-19 NOTE — Telephone Encounter (Signed)
Pt called in for refill on her baclofen (LIORESAL) 10 MG tablet [[id number]]   [facility] on [address]

## 2014-08-20 DIAGNOSIS — I5022 Chronic systolic (congestive) heart failure: Secondary | ICD-10-CM | POA: Diagnosis not present

## 2014-08-20 DIAGNOSIS — T451X5S Adverse effect of antineoplastic and immunosuppressive drugs, sequela: Secondary | ICD-10-CM | POA: Diagnosis not present

## 2014-08-20 DIAGNOSIS — I427 Cardiomyopathy due to drug and external agent: Secondary | ICD-10-CM | POA: Diagnosis not present

## 2014-08-20 DIAGNOSIS — G620 Drug-induced polyneuropathy: Secondary | ICD-10-CM | POA: Diagnosis not present

## 2014-08-20 DIAGNOSIS — M797 Fibromyalgia: Secondary | ICD-10-CM | POA: Diagnosis not present

## 2014-08-20 DIAGNOSIS — F449 Dissociative and conversion disorder, unspecified: Secondary | ICD-10-CM | POA: Diagnosis not present

## 2014-08-20 MED ORDER — BACLOFEN 10 MG PO TABS: ORAL_TABLET | ORAL | Status: DC

## 2014-08-20 NOTE — Addendum Note (Signed)
Addended by: Mauricio Po D on: 08/20/2014 01:34 PM   Modules accepted: Orders

## 2014-08-20 NOTE — Telephone Encounter (Signed)
Medication refilled

## 2014-08-22 DIAGNOSIS — I5022 Chronic systolic (congestive) heart failure: Secondary | ICD-10-CM | POA: Diagnosis not present

## 2014-08-22 DIAGNOSIS — M797 Fibromyalgia: Secondary | ICD-10-CM | POA: Diagnosis not present

## 2014-08-22 DIAGNOSIS — E119 Type 2 diabetes mellitus without complications: Secondary | ICD-10-CM | POA: Diagnosis not present

## 2014-08-22 DIAGNOSIS — F449 Dissociative and conversion disorder, unspecified: Secondary | ICD-10-CM | POA: Diagnosis not present

## 2014-08-22 DIAGNOSIS — T451X5S Adverse effect of antineoplastic and immunosuppressive drugs, sequela: Secondary | ICD-10-CM | POA: Diagnosis not present

## 2014-08-22 DIAGNOSIS — I427 Cardiomyopathy due to drug and external agent: Secondary | ICD-10-CM | POA: Diagnosis not present

## 2014-08-22 DIAGNOSIS — G620 Drug-induced polyneuropathy: Secondary | ICD-10-CM | POA: Diagnosis not present

## 2014-08-23 ENCOUNTER — Other Ambulatory Visit: Payer: Self-pay | Admitting: Neurology

## 2014-08-23 NOTE — Telephone Encounter (Signed)
Called patient, got no answer.  Left message asking that she call us back to schedule annual appt.

## 2014-08-26 ENCOUNTER — Telehealth: Payer: Self-pay | Admitting: Internal Medicine

## 2014-08-26 ENCOUNTER — Ambulatory Visit (INDEPENDENT_AMBULATORY_CARE_PROVIDER_SITE_OTHER): Payer: Medicare Other | Admitting: *Deleted

## 2014-08-26 ENCOUNTER — Telehealth: Payer: Self-pay | Admitting: Cardiology

## 2014-08-26 DIAGNOSIS — I429 Cardiomyopathy, unspecified: Secondary | ICD-10-CM

## 2014-08-26 DIAGNOSIS — I428 Other cardiomyopathies: Secondary | ICD-10-CM

## 2014-08-26 DIAGNOSIS — I5022 Chronic systolic (congestive) heart failure: Secondary | ICD-10-CM

## 2014-08-26 NOTE — Telephone Encounter (Signed)
Attempted to confirm remote transmission with pt. No answer and was unable to leave a message.   

## 2014-08-26 NOTE — Telephone Encounter (Signed)
Informed pt that transmission was received.  

## 2014-08-26 NOTE — Progress Notes (Signed)
Remote ICD transmission.   

## 2014-08-26 NOTE — Telephone Encounter (Signed)
New Msg        Pt returning call from today from device.    Please return pt call.

## 2014-08-27 LAB — MDC_IDC_ENUM_SESS_TYPE_REMOTE
Battery Voltage: 3.15 V
Brady Statistic RV Percent Paced: 0 %
Date Time Interrogation Session: 20160314183103
HighPow Impedance: 361 Ohm
HighPow Impedance: 60 Ohm
Lead Channel Setting Pacing Pulse Width: 0.4 ms
Lead Channel Setting Sensing Sensitivity: 0.3 mV
MDC IDC MSMT LEADCHNL RV IMPEDANCE VALUE: 399 Ohm
MDC IDC MSMT LEADCHNL RV PACING THRESHOLD AMPLITUDE: 1.125 V
MDC IDC MSMT LEADCHNL RV PACING THRESHOLD PULSEWIDTH: 0.4 ms
MDC IDC MSMT LEADCHNL RV SENSING INTR AMPL: 16.625 mV
MDC IDC MSMT LEADCHNL RV SENSING INTR AMPL: 16.625 mV
MDC IDC SET LEADCHNL RV PACING AMPLITUDE: 2.25 V
MDC IDC SET ZONE DETECTION INTERVAL: 250 ms
Zone Setting Detection Interval: 300 ms
Zone Setting Detection Interval: 400 ms

## 2014-08-28 ENCOUNTER — Telehealth: Payer: Self-pay

## 2014-08-28 NOTE — Telephone Encounter (Signed)
Patient declined flu shot this season.

## 2014-08-31 ENCOUNTER — Other Ambulatory Visit: Payer: Self-pay | Admitting: Family

## 2014-08-31 ENCOUNTER — Other Ambulatory Visit: Payer: Self-pay | Admitting: Pulmonary Disease

## 2014-09-04 DIAGNOSIS — I5022 Chronic systolic (congestive) heart failure: Secondary | ICD-10-CM | POA: Diagnosis not present

## 2014-09-04 DIAGNOSIS — T451X5S Adverse effect of antineoplastic and immunosuppressive drugs, sequela: Secondary | ICD-10-CM | POA: Diagnosis not present

## 2014-09-04 DIAGNOSIS — G620 Drug-induced polyneuropathy: Secondary | ICD-10-CM | POA: Diagnosis not present

## 2014-09-04 DIAGNOSIS — F449 Dissociative and conversion disorder, unspecified: Secondary | ICD-10-CM | POA: Diagnosis not present

## 2014-09-04 DIAGNOSIS — M797 Fibromyalgia: Secondary | ICD-10-CM | POA: Diagnosis not present

## 2014-09-04 DIAGNOSIS — I427 Cardiomyopathy due to drug and external agent: Secondary | ICD-10-CM | POA: Diagnosis not present

## 2014-09-12 ENCOUNTER — Encounter: Payer: Self-pay | Admitting: Internal Medicine

## 2014-09-12 ENCOUNTER — Telehealth (HOSPITAL_COMMUNITY): Payer: Self-pay | Admitting: Vascular Surgery

## 2014-09-12 NOTE — Telephone Encounter (Signed)
Verbal order given to [place] for continuation of services/PT

## 2014-09-12 NOTE — Telephone Encounter (Signed)
Physical therapist  from [facility] pt is making progress with Physical therapy, she has had the flu so she missed several visits. Physical therapist would like a order to see the pt once a week for 9 weeks.. Please advise

## 2014-09-17 ENCOUNTER — Encounter: Payer: Self-pay | Admitting: Cardiology

## 2014-09-18 ENCOUNTER — Telehealth: Payer: Self-pay | Admitting: *Deleted

## 2014-09-18 ENCOUNTER — Other Ambulatory Visit: Payer: Self-pay | Admitting: *Deleted

## 2014-09-18 DIAGNOSIS — C50911 Malignant neoplasm of unspecified site of right female breast: Secondary | ICD-10-CM

## 2014-09-18 DIAGNOSIS — I5022 Chronic systolic (congestive) heart failure: Secondary | ICD-10-CM | POA: Diagnosis not present

## 2014-09-18 DIAGNOSIS — G620 Drug-induced polyneuropathy: Secondary | ICD-10-CM | POA: Diagnosis not present

## 2014-09-18 DIAGNOSIS — I427 Cardiomyopathy due to drug and external agent: Secondary | ICD-10-CM | POA: Diagnosis not present

## 2014-09-18 DIAGNOSIS — M797 Fibromyalgia: Secondary | ICD-10-CM | POA: Diagnosis not present

## 2014-09-18 DIAGNOSIS — F449 Dissociative and conversion disorder, unspecified: Secondary | ICD-10-CM | POA: Diagnosis not present

## 2014-09-18 DIAGNOSIS — T451X5S Adverse effect of antineoplastic and immunosuppressive drugs, sequela: Secondary | ICD-10-CM | POA: Diagnosis not present

## 2014-09-18 NOTE — Telephone Encounter (Signed)
Patient called to say she missed a call from our office.  She thinks it was about her appts.  Gave her dates and times for her appts. This month.

## 2014-09-19 ENCOUNTER — Other Ambulatory Visit (HOSPITAL_BASED_OUTPATIENT_CLINIC_OR_DEPARTMENT_OTHER): Payer: Medicare Other

## 2014-09-19 DIAGNOSIS — Z853 Personal history of malignant neoplasm of breast: Secondary | ICD-10-CM

## 2014-09-19 DIAGNOSIS — C50911 Malignant neoplasm of unspecified site of right female breast: Secondary | ICD-10-CM

## 2014-09-19 LAB — COMPREHENSIVE METABOLIC PANEL (CC13)
ALBUMIN: 3.4 g/dL — AB (ref 3.5–5.0)
ALK PHOS: 87 U/L (ref 40–150)
ALT: 14 U/L (ref 0–55)
AST: 21 U/L (ref 5–34)
Anion Gap: 13 mEq/L — ABNORMAL HIGH (ref 3–11)
BUN: 11.6 mg/dL (ref 7.0–26.0)
CALCIUM: 8.6 mg/dL (ref 8.4–10.4)
CHLORIDE: 105 meq/L (ref 98–109)
CO2: 23 meq/L (ref 22–29)
Creatinine: 0.9 mg/dL (ref 0.6–1.1)
EGFR: 85 mL/min/{1.73_m2} — AB (ref >90)
GLUCOSE: 103 mg/dL (ref 70–140)
POTASSIUM: 3.5 meq/L (ref 3.5–5.1)
Sodium: 141 mEq/L (ref 136–145)
Total Bilirubin: 1.62 mg/dL — ABNORMAL HIGH (ref 0.20–1.20)
Total Protein: 7.2 g/dL (ref 6.4–8.3)

## 2014-09-19 LAB — CBC WITH DIFFERENTIAL/PLATELET
BASO%: 0.8 % (ref 0.0–2.0)
BASOS ABS: 0.1 10*3/uL (ref 0.0–0.1)
EOS ABS: 0.1 10*3/uL (ref 0.0–0.5)
EOS%: 1.2 % (ref 0.0–7.0)
HCT: 40.2 % (ref 34.8–46.6)
HEMOGLOBIN: 12.7 g/dL (ref 11.6–15.9)
LYMPH%: 25.4 % (ref 14.0–49.7)
MCH: 28.6 pg (ref 25.1–34.0)
MCHC: 31.5 g/dL (ref 31.5–36.0)
MCV: 90.8 fL (ref 79.5–101.0)
MONO#: 0.6 10*3/uL (ref 0.1–0.9)
MONO%: 9 % (ref 0.0–14.0)
NEUT#: 4.4 10*3/uL (ref 1.5–6.5)
NEUT%: 63.6 % (ref 38.4–76.8)
Platelets: 165 10*3/uL (ref 145–400)
RBC: 4.42 10*6/uL (ref 3.70–5.45)
RDW: 20.8 % — ABNORMAL HIGH (ref 11.2–14.5)
WBC: 6.8 10*3/uL (ref 3.9–10.3)
lymph#: 1.7 10*3/uL (ref 0.9–3.3)

## 2014-09-22 DIAGNOSIS — I427 Cardiomyopathy due to drug and external agent: Secondary | ICD-10-CM | POA: Diagnosis not present

## 2014-09-22 DIAGNOSIS — J449 Chronic obstructive pulmonary disease, unspecified: Secondary | ICD-10-CM | POA: Diagnosis not present

## 2014-09-22 DIAGNOSIS — E119 Type 2 diabetes mellitus without complications: Secondary | ICD-10-CM | POA: Diagnosis not present

## 2014-09-22 DIAGNOSIS — Z853 Personal history of malignant neoplasm of breast: Secondary | ICD-10-CM | POA: Diagnosis not present

## 2014-09-22 DIAGNOSIS — I10 Essential (primary) hypertension: Secondary | ICD-10-CM | POA: Diagnosis not present

## 2014-09-22 DIAGNOSIS — M797 Fibromyalgia: Secondary | ICD-10-CM | POA: Diagnosis not present

## 2014-09-22 DIAGNOSIS — I5022 Chronic systolic (congestive) heart failure: Secondary | ICD-10-CM | POA: Diagnosis not present

## 2014-09-22 DIAGNOSIS — T451X5S Adverse effect of antineoplastic and immunosuppressive drugs, sequela: Secondary | ICD-10-CM | POA: Diagnosis not present

## 2014-09-22 DIAGNOSIS — G8929 Other chronic pain: Secondary | ICD-10-CM | POA: Diagnosis not present

## 2014-09-22 DIAGNOSIS — G620 Drug-induced polyneuropathy: Secondary | ICD-10-CM | POA: Diagnosis not present

## 2014-09-22 DIAGNOSIS — F449 Dissociative and conversion disorder, unspecified: Secondary | ICD-10-CM | POA: Diagnosis not present

## 2014-09-25 ENCOUNTER — Encounter: Payer: Self-pay | Admitting: Internal Medicine

## 2014-09-26 ENCOUNTER — Telehealth: Payer: Self-pay | Admitting: Oncology

## 2014-09-26 ENCOUNTER — Ambulatory Visit: Payer: Medicare Other | Admitting: Oncology

## 2014-09-26 NOTE — Progress Notes (Signed)
No show

## 2014-09-26 NOTE — Telephone Encounter (Signed)
pt cld stated had car trouble CX appt-will call back to r/s

## 2014-10-03 DIAGNOSIS — I5022 Chronic systolic (congestive) heart failure: Secondary | ICD-10-CM | POA: Diagnosis not present

## 2014-10-03 DIAGNOSIS — T451X5S Adverse effect of antineoplastic and immunosuppressive drugs, sequela: Secondary | ICD-10-CM | POA: Diagnosis not present

## 2014-10-03 DIAGNOSIS — F449 Dissociative and conversion disorder, unspecified: Secondary | ICD-10-CM | POA: Diagnosis not present

## 2014-10-03 DIAGNOSIS — M797 Fibromyalgia: Secondary | ICD-10-CM | POA: Diagnosis not present

## 2014-10-03 DIAGNOSIS — G620 Drug-induced polyneuropathy: Secondary | ICD-10-CM | POA: Diagnosis not present

## 2014-10-03 DIAGNOSIS — I427 Cardiomyopathy due to drug and external agent: Secondary | ICD-10-CM | POA: Diagnosis not present

## 2014-10-08 ENCOUNTER — Telehealth: Payer: Self-pay | Admitting: Internal Medicine

## 2014-10-08 NOTE — Telephone Encounter (Signed)
Spoke with patient about remote transmission. Chief complaint is SOB. She reports she takes lasix MWF, has 2 lbs of weight gain, but feels as if she "has run a 5k" if she goes to the bathroom. Pt reports she is seen in the HF clinic- advised to call Heather with symptoms for diuretic management. Offered to review symptoms with Dr. Caryl Comes as well- pt would rather refer to HF clinic.

## 2014-10-08 NOTE — Telephone Encounter (Signed)
New Message  Pt calling to speak w/ device about remote check. Please call back and discuss.

## 2014-10-09 DIAGNOSIS — G620 Drug-induced polyneuropathy: Secondary | ICD-10-CM | POA: Diagnosis not present

## 2014-10-09 DIAGNOSIS — I427 Cardiomyopathy due to drug and external agent: Secondary | ICD-10-CM | POA: Diagnosis not present

## 2014-10-09 DIAGNOSIS — F449 Dissociative and conversion disorder, unspecified: Secondary | ICD-10-CM | POA: Diagnosis not present

## 2014-10-09 DIAGNOSIS — T451X5S Adverse effect of antineoplastic and immunosuppressive drugs, sequela: Secondary | ICD-10-CM | POA: Diagnosis not present

## 2014-10-09 DIAGNOSIS — I5022 Chronic systolic (congestive) heart failure: Secondary | ICD-10-CM | POA: Diagnosis not present

## 2014-10-09 DIAGNOSIS — M797 Fibromyalgia: Secondary | ICD-10-CM | POA: Diagnosis not present

## 2014-10-09 NOTE — Telephone Encounter (Signed)
Received pt's optivol report from 4/26, fluid level was elevated from mid March to April 18th then dropped of and is now at baseline, attempted to call pt with no answer will try again later

## 2014-10-10 ENCOUNTER — Other Ambulatory Visit: Payer: Self-pay | Admitting: Family

## 2014-10-10 ENCOUNTER — Other Ambulatory Visit: Payer: Self-pay | Admitting: Pulmonary Disease

## 2014-10-10 NOTE — Telephone Encounter (Signed)
Pt has not been seen since 07/08/14 please advise on refills

## 2014-10-10 NOTE — Telephone Encounter (Signed)
Needs office visit please

## 2014-10-11 ENCOUNTER — Telehealth: Payer: Self-pay | Admitting: Nurse Practitioner

## 2014-10-11 ENCOUNTER — Telehealth: Payer: Self-pay | Admitting: Pulmonary Disease

## 2014-10-11 NOTE — Telephone Encounter (Signed)
lmtcb x1 

## 2014-10-11 NOTE — Telephone Encounter (Signed)
pt cld to r/s appt-gave pt time & date-pt understood

## 2014-10-14 ENCOUNTER — Encounter: Payer: Self-pay | Admitting: Family

## 2014-10-14 ENCOUNTER — Ambulatory Visit (INDEPENDENT_AMBULATORY_CARE_PROVIDER_SITE_OTHER): Payer: Medicare Other | Admitting: Family

## 2014-10-14 VITALS — BP 100/68 | HR 88 | Resp 20 | Ht 67.0 in | Wt 242.8 lb

## 2014-10-14 DIAGNOSIS — K30 Functional dyspepsia: Secondary | ICD-10-CM

## 2014-10-14 DIAGNOSIS — F411 Generalized anxiety disorder: Secondary | ICD-10-CM | POA: Diagnosis not present

## 2014-10-14 DIAGNOSIS — F449 Dissociative and conversion disorder, unspecified: Secondary | ICD-10-CM

## 2014-10-14 DIAGNOSIS — R109 Unspecified abdominal pain: Secondary | ICD-10-CM

## 2014-10-14 MED ORDER — BACLOFEN 10 MG PO TABS: ORAL_TABLET | ORAL | Status: DC

## 2014-10-14 MED ORDER — ALPRAZOLAM 1 MG PO TABS: 1.0000 mg | ORAL_TABLET | Freq: Every day | ORAL | Status: DC

## 2014-10-14 NOTE — Progress Notes (Signed)
Subjective:    Patient ID: [name], female    DOB: 1970/12/04, 44 y.o.   MRN: [id number]  Chief Complaint  Patient presents with  . Follow-up    refill of xanax and muscles relaxer, referral to GI having some stomach issues, still having mucous issues that was stated last time to be coming from her stomach, but she is bringing up alot of mucous    HPI:  [name] is a 44 y.o. female with a PMH of hypetension, heart failure, asthma and GERD who presents today for a follow up office visit.    1) Medication refill - Patient is requesting medication refills of her Xanax and baclofen. She currently takes Xanax nightly to help her fall asleep. Denies any adverse effects of the medication.   2) Conversion disorder - previously diagnosed with conversion disorder and seen by neurology and neuropsychology. Indicates in the last week that her symptoms have progressively worsened with increased incidence of "passing out" and the ability to walk without wobbling or losing her balance. Her mother confirms difficulty walking and "getting stuck" doing things like activities of daily living. She indicates these passing out episodes where she becomes unconscious, however can still hear everything that is going on. She describes it as a hypersensitivity of her nervous system. She is requesting a referral to the movement disorder specialists. Her mother also notes that somebody needs to be with her at all times.  3) Stomach issue - Associated symptom of increased mucus production that is coming from her stomach. This has been going on since the last time that she was seen.    Allergies  Allergen Reactions  . Other Rash    Chloraprep  . Reglan [Metoclopramide Hcl] Anxiety    Current Outpatient Prescriptions on File Prior to Visit  Medication Sig Dispense Refill  . albuterol (PROAIR HFA) 108 (90 BASE) MCG/ACT inhaler Inhale 1-2 puffs into the lungs every 6 (six) hours as needed for wheezing or  shortness of breath. Pt needs appt for more refills 8.5 each 0  . ALPRAZolam (XANAX) 1 MG tablet TAKE 1 TABLET AT BEDTIME 30 tablet 0  . B Complex-C (B-COMPLEX WITH VITAMIN C) tablet Take 1 tablet by mouth daily.    . baclofen (LIORESAL) 10 MG tablet TAKE 1 TABLET BY MOUTH 2 (TWO) TIMES DAILY AS NEEDED FOR MUSCLE SPASMS. 30 tablet 0  . carvedilol (COREG) 25 MG tablet TAKE 1 TABLET BY MOUTH TWICE A DAY WITH A MEAL, MUST HAVE FOLLOW UP APPOINTMENT FOR FURTHER REFILLS 60 tablet 6  . Cholecalciferol (VITAMIN D3) 5000 UNITS CAPS Take 1 capsule by mouth daily.     . Coenzyme Q10 (CO Q-10) 100 MG CAPS Take 100 mg by mouth daily.    . Cyanocobalamin 2500 MCG SUBL Place 2,500 mcg under the tongue daily.    . digoxin (LANOXIN) 0.125 MG tablet Take 0.5 tablets (0.0625 mg total) by mouth daily. 30 tablet 6  . divalproex (DEPAKOTE ER) 250 MG 24 hr tablet TAKE 1 TABLET (250 MG TOTAL) BY MOUTH DAILY. 90 tablet 2  . divalproex (DEPAKOTE ER) 250 MG 24 hr tablet TAKE 1 TABLET (250 MG TOTAL) BY MOUTH DAILY. 30 tablet 0  . divalproex (DEPAKOTE ER) 250 MG 24 hr tablet TAKE 1 TABLET (250 MG TOTAL) BY MOUTH DAILY. 15 tablet 0  . fluconazole (DIFLUCAN) 150 MG tablet Take 1 tablet (150 mg total) by mouth once. 1 tablet 0  . GLUCOSAMINE-CHONDROITIN PO Take 1,200 mg by mouth  daily.    . loratadine (CLARITIN) 10 MG tablet Take 10 mg by mouth daily.    . metolazone (ZAROXOLYN) 2.5 MG tablet TAKE 1 TABLET (2.5 MG TOTAL) BY MOUTH AS DIRECTED. 5 tablet 1  . mometasone-formoterol (DULERA) 100-5 MCG/ACT AERO Inhale 2 puffs into the lungs 2 (two) times daily. 1 Inhaler 5  . montelukast (SINGULAIR) 10 MG tablet TAKE 1 TABLET (10 MG TOTAL) BY MOUTH AT BEDTIME. 30 tablet 0  . Multiple Vitamin (MULTIVITAMIN WITH MINERALS) TABS Take 1 tablet by mouth daily.    Marland Kitchen nystatin (MYCOSTATIN) 100000 UNIT/ML suspension Take 5 mLs (500,000 Units total) by mouth 4 (four) times daily. 60 mL 0  . omeprazole (PRILOSEC) 20 MG capsule Take 1 capsule  (20 mg total) by mouth daily. 30 capsule 5  . oxyCODONE-acetaminophen (PERCOCET) 10-325 MG per tablet Take 1 tablet by mouth every 4 (four) hours as needed. For pain    . PATADAY 0.2 % SOLN     . pentosan polysulfate (ELMIRON) 100 MG capsule Take 1 capsule (100 mg total) by mouth 2 (two) times daily. 60 capsule 3  . potassium chloride SA (K-DUR,KLOR-Krystal) 20 MEQ tablet Take 1 tablet (20 mEq total) by mouth 3 (three) times a week. Every Mon, Wed and Fri with torsemide    . pregabalin (LYRICA) 200 MG capsule Take 600 mg by mouth at bedtime.     Marland Kitchen PRODIGY NO CODING BLOOD GLUC test strip     . spironolactone (ALDACTONE) 25 MG tablet Take 0.5 tablets (12.5 mg total) by mouth daily. 15 tablet 6  . torsemide (DEMADEX) 20 MG tablet Take 1 tablet (20 mg total) by mouth 3 (three) times a week. Every Mon, Wed and Fri    . zolpidem (AMBIEN CR) 12.5 MG CR tablet Take 12.5 mg by mouth.      No current facility-administered medications on file prior to visit.    Review of Systems  Constitutional: Negative for fever and chills.  Neurological: Positive for dizziness, weakness and light-headedness.  Psychiatric/Behavioral: Positive for dysphoric mood. The patient is nervous/anxious.       Objective:    BP 100/68 mmHg  Pulse 88  Resp 20  Ht 5\' 7"  (1.702 m)  Wt 242 lb 12.8 oz (110.133 kg)  BMI 38.02 kg/m2  SpO2 99%  Nursing note and vital signs reviewed.  Physical Exam  Constitutional: She is oriented to person, place, and time. She appears well-developed and well-nourished. No distress.  Cardiovascular: Normal rate, regular rhythm, normal heart sounds and intact distal pulses.   Pulmonary/Chest: Effort normal and breath sounds normal.  Neurological: She is alert and oriented to person, place, and time.  Skin: Skin is warm and dry.  Psychiatric: She has a normal mood and affect. Her behavior is normal. Judgment and thought content normal.       Assessment & Plan:

## 2014-10-14 NOTE — Progress Notes (Signed)
Blood pressure was taken with child size cuff.  

## 2014-10-14 NOTE — Telephone Encounter (Signed)
I can see her in office first and then determine if repeat PFT's are needed.

## 2014-10-14 NOTE — Patient Instructions (Signed)
Thank you for choosing [facility].  Summary/Instructions:  Your prescription(s) have been submitted to your pharmacy or been printed and provided for you. Please take as directed and contact our office if you believe you are having problem(s) with the medication(s) or have any questions.  If your symptoms worsen or fail to improve, please contact our office for further instruction, or in case of emergency go directly to the emergency room at the closest medical facility.    Conversion Disorder Conversion disorder is a type of somatoform disorder. A person with this disorder experiences symptoms that cannot be explained by medical findings. The symptoms are real. They are not faked or created. Symptoms may last for a few days or weeks. Often, a person with this disorder is not overly concerned about the severe symptoms. CAUSES It is unclear what causes conversion disorder. It may be related to:  Recent psychological stress or conflict.  History of medical illness.  History of mental illness.  Family history of somatoform disorder. It is thought that the symptoms may be the result of avoiding an emotional conflict. Often psychological problems are relieved when the physical symptoms appear. The disorder usually occurs in people age 23 through 46. SYMPTOMS Symptoms can be sudden and severe. While the symptoms may not have a physical cause, they may result in discomfort or distress. DIAGNOSIS Diagnostic testing will vary depending on the specific symptoms involved. Evaluation may include:  Physical exam.  Screening health questionnaire.  Medical tests, such as blood tests, urine tests, X-rays, or other imaging tests.  Psychological exam. Ask when your test results will be ready. Make sure you get your test results. TREATMENT There is no cure for conversion disorder, but the symptoms can be managed and sometimes prevented. Treatment aims at teaching coping skills, reducing  stress, and preventing new episodes. Once the diagnosis of conversion disorder is made, treatment may include:  Regular monitoring and consultation with a dedicated caregiver for evaluation and coping skills.  Regular monitoring and therapy with a mental health provider to increase understanding of triggers and coping skills. Therapies may include:  Talk therapy.  Stress management training.  Cognitive behavioral therapy.  Hypnosis.  Antidepressant medicines.  Magnetic brain stimulation.  If paralysis or numbness is involved, physical therapy may be needed to maintain muscle strength.  For other neurological symptoms, occupational therapy may be needed. It can be helpful for a primary caregiver and mental health provider to work together to develop a treatment plan. HOME CARE INSTRUCTIONS  Follow up with your primary caregiver or mental health provider as directed.  Follow up with physical or occupational therapists as directed.  Take medicines as directed.  Try to reduce stress. SEEK MEDICAL CARE IF:  Symptoms do not go away or become severe.  New symptoms appear. SEEK IMMEDIATE MEDICAL CARE IF: Your symptoms are causing distress to the point that you are considering hurting yourself or others. MAKE SURE YOU:   Understand these instructions.  Will watch your condition.  Will get help right away if you are not doing well or get worse. Document Released: 07/03/2010 Document Revised: 08/23/2011 Document Reviewed: 07/03/2010 [facility] Patient Information 2015 [place], [place]. This information is not intended to replace advice given to you by your health care provider. Make sure you discuss any questions you have with your health care provider.

## 2014-10-14 NOTE — Telephone Encounter (Signed)
Pt states that when she was last seen ( 01/23/14 with Dr Elsworth Soho ) that she was told she needs to have another PFT. I do not see where this is mentioned in OV notes. Will send to Dr Halford Chessman to see if patient needs to have another done since having increased symptoms. Pt reports having incr SOB with/without exertion. Please advise Dr Halford Chessman. Thanks.

## 2014-10-14 NOTE — Assessment & Plan Note (Addendum)
Conversion disorder appears labile at present. Continue current medications as previously prescribed. Referral placed to neurology for movement disorder team. Continue with neuropsychology as directed.

## 2014-10-14 NOTE — Telephone Encounter (Signed)
Pt needs appt scheduled to see Dr Halford Chessman (1st available) - will determine if PFT testing is needed.  LMTCB x 1 for pt

## 2014-10-14 NOTE — Assessment & Plan Note (Signed)
Anxiety remained labile with conversion disorder. Refill Xanax 1 mg as needed at bedtime for sleep. Continue baclofen as needed for muscle spasm. Follow-up with neurology, neuropsych  and movement disorder.

## 2014-10-14 NOTE — Assessment & Plan Note (Signed)
Patient presents with increased mucus production coming from her stomach. Refer to GI per patient request.

## 2014-10-15 ENCOUNTER — Telehealth: Payer: Self-pay | Admitting: Family

## 2014-10-15 NOTE — Telephone Encounter (Signed)
Please inform the patient that [facility] does not treat conversion disorder so her referral has been sent to [facility].

## 2014-10-15 NOTE — Telephone Encounter (Signed)
Spoke with pt and offered her the next available appt with VS on 6/9, she declined this and requested an earlier appt.  I advised that this was the earliest available appt with VS but I'd see if he's ok with her seeing TP sooner.    VS are you ok with pt seeing TP to determine repeat PFT's, or do you prefer to see this pt yourself?  Thanks!

## 2014-10-15 NOTE — Telephone Encounter (Signed)
Spoke with [name] and advised that Dr Halford Chessman is ok with her seeing Tammy Parrett. [name] wil call our office back to schedule this appt when she has her transportation schedule in front of her.

## 2014-10-15 NOTE — Telephone Encounter (Signed)
Can f/u with Tammy Parrett first.

## 2014-10-17 ENCOUNTER — Encounter: Payer: Self-pay | Admitting: Physician Assistant

## 2014-10-17 DIAGNOSIS — M797 Fibromyalgia: Secondary | ICD-10-CM | POA: Diagnosis not present

## 2014-10-17 DIAGNOSIS — G620 Drug-induced polyneuropathy: Secondary | ICD-10-CM | POA: Diagnosis not present

## 2014-10-17 DIAGNOSIS — I5022 Chronic systolic (congestive) heart failure: Secondary | ICD-10-CM | POA: Diagnosis not present

## 2014-10-17 DIAGNOSIS — T451X5S Adverse effect of antineoplastic and immunosuppressive drugs, sequela: Secondary | ICD-10-CM | POA: Diagnosis not present

## 2014-10-17 DIAGNOSIS — I427 Cardiomyopathy due to drug and external agent: Secondary | ICD-10-CM | POA: Diagnosis not present

## 2014-10-17 DIAGNOSIS — F449 Dissociative and conversion disorder, unspecified: Secondary | ICD-10-CM | POA: Diagnosis not present

## 2014-10-17 NOTE — Telephone Encounter (Signed)
Pt states that she does not want to go to [facility] because she went there in the passed and they diagnosed her with the disorder but referred her to [place] where they just checked her muscles and they told her to go back to [facility] and they said there was not much more they could do about her condition. Is there anywhere else that she could be referred. Please advise

## 2014-10-18 NOTE — Telephone Encounter (Signed)
I am not aware of anyone that specifically treats conversion disorder. I will have to investigate this further.

## 2014-10-21 ENCOUNTER — Ambulatory Visit: Payer: Self-pay | Admitting: Physician Assistant

## 2014-10-23 DIAGNOSIS — T451X5S Adverse effect of antineoplastic and immunosuppressive drugs, sequela: Secondary | ICD-10-CM | POA: Diagnosis not present

## 2014-10-23 DIAGNOSIS — I5022 Chronic systolic (congestive) heart failure: Secondary | ICD-10-CM | POA: Diagnosis not present

## 2014-10-23 DIAGNOSIS — G620 Drug-induced polyneuropathy: Secondary | ICD-10-CM | POA: Diagnosis not present

## 2014-10-23 DIAGNOSIS — I427 Cardiomyopathy due to drug and external agent: Secondary | ICD-10-CM | POA: Diagnosis not present

## 2014-10-23 DIAGNOSIS — F449 Dissociative and conversion disorder, unspecified: Secondary | ICD-10-CM | POA: Diagnosis not present

## 2014-10-23 DIAGNOSIS — M797 Fibromyalgia: Secondary | ICD-10-CM | POA: Diagnosis not present

## 2014-10-24 ENCOUNTER — Ambulatory Visit (HOSPITAL_BASED_OUTPATIENT_CLINIC_OR_DEPARTMENT_OTHER): Payer: Medicare Other | Admitting: Nurse Practitioner

## 2014-10-24 ENCOUNTER — Telehealth: Payer: Self-pay | Admitting: Oncology

## 2014-10-24 VITALS — BP 89/45 | HR 86 | Temp 97.7°F | Resp 18 | Ht 67.0 in | Wt 252.2 lb

## 2014-10-24 DIAGNOSIS — R110 Nausea: Secondary | ICD-10-CM

## 2014-10-24 DIAGNOSIS — C50911 Malignant neoplasm of unspecified site of right female breast: Secondary | ICD-10-CM

## 2014-10-24 DIAGNOSIS — R140 Abdominal distension (gaseous): Secondary | ICD-10-CM

## 2014-10-24 DIAGNOSIS — F508 Other eating disorders: Secondary | ICD-10-CM | POA: Diagnosis not present

## 2014-10-24 DIAGNOSIS — Z853 Personal history of malignant neoplasm of breast: Secondary | ICD-10-CM | POA: Diagnosis not present

## 2014-10-24 DIAGNOSIS — I429 Cardiomyopathy, unspecified: Secondary | ICD-10-CM

## 2014-10-24 DIAGNOSIS — J329 Chronic sinusitis, unspecified: Secondary | ICD-10-CM | POA: Diagnosis not present

## 2014-10-24 DIAGNOSIS — M797 Fibromyalgia: Secondary | ICD-10-CM

## 2014-10-24 DIAGNOSIS — G622 Polyneuropathy due to other toxic agents: Secondary | ICD-10-CM | POA: Diagnosis not present

## 2014-10-24 DIAGNOSIS — R109 Unspecified abdominal pain: Secondary | ICD-10-CM

## 2014-10-24 DIAGNOSIS — G8929 Other chronic pain: Secondary | ICD-10-CM | POA: Diagnosis not present

## 2014-10-24 DIAGNOSIS — F449 Dissociative and conversion disorder, unspecified: Secondary | ICD-10-CM

## 2014-10-24 DIAGNOSIS — R17 Unspecified jaundice: Secondary | ICD-10-CM

## 2014-10-24 MED ORDER — AMOXICILLIN-POT CLAVULANATE 875-125 MG PO TABS: 1.0000 | ORAL_TABLET | Freq: Two times a day (BID) | ORAL | Status: DC

## 2014-10-24 MED ORDER — FLUCONAZOLE 150 MG PO TABS: 150.0000 mg | ORAL_TABLET | Freq: Every day | ORAL | Status: DC

## 2014-10-24 NOTE — Telephone Encounter (Signed)
Appointments made and avs printed for patient °

## 2014-10-24 NOTE — Progress Notes (Signed)
[place]  [phone]) [phone] [phone]) [phone]     ID: [name] DOB: 1971-03-25  MR#: [id number]  TJQ#:[id number]  Patient Care Team: Golden Circle, FNP as PCP - General (Family Medicine) Jolaine Artist, MD as Consulting Physician (Cardiology) Chauncey Cruel, MD as Consulting Physician (Oncology) Deboraha Sprang, MD as Consulting Physician (Cardiology) Larey Seat, MD as Consulting Physician (Neurology) Chesley Mires, MD as Consulting Physician (Pulmonary Disease) PCP: Mauricio Po, FNP GYN: Ena Dawley SU:  OTHER MD: Glori Bickers, Arloa Koh, Jolyn Nap, Asencion Partridge Dohmeier  CHIEF COMPLAINT: Stage II breast cancer  CURRENT TREATMENT: Observation   BREAST CANCER HISTORY:  from Dr. Collier Salina Roy's original intake note dated 08/31/2006   "This woman has been in good health.  She had a screening mammogram done via mobile mammogram unit.  A baseline mammogram did show a lesion or suspicious pigmented area at 9 o'clock in the right breast.  [facility]  A digital right diagnostic mammogram and ultrasound was done on 08/01/2006.  Physical examination did palpate a 1.5 cm mass at 9 o'clock position 7 cm from the nipple.  Ultrasound showed this to be 1.5 x 1.3 x 1.0 cm.  Biopsy was recommended.  Biopsy on 08/01/2006 showed invasive adenocarcinoma associated with DCIS, which appeared to be high-grade.  ER and PR were positive at 99% and 98% respectively.  Proliferative index was 16%.  HER-2/neu was 1+.  The patient underwent a lumpectomy and sentinel lymph node evaluation on 08/22/2006.  Prior to that, a MRI of both breasts was done on 08/10/2006, which documented a 2.4 cm spiculated mass in the breast.  No other abnormalities were seen.  The lumpectomy and sentinel lymph node evaluation on 08/22/2006 showed a 2.6 cm, grade 3/3 invasive ductal cancer, lymphovascular invasion was seen focally.  Surgical margins  were clear.  A total of five lymph nodes were removed at the time of sentinel node, one of which was involved with malignancy.  The patient has had an unremarkable postoperative course."  Her subsequent history is detailed below   INTERVAL HISTORY: [name] returns today for follow-up of her breast cancer, accompanied by her husband and mother. The interval history is unremarkable. She continues to battle conversion disorder, and blacks about about 2-3 times daily for short periods of time. She is having difficulty locating the proper specialist to overlook this condition. New this visit is pica with cornstarch. She complains of constant sinus symptoms and mucus that changes to different shades of green. She has frequent yeast infections and wonders if this is because of ingesting so much starch. She has abdominal pain and swelling along with intermittent nausea.  REVIEW OF SYSTEMS:  She suffers from chronic pain secondary to fibromyalgia, problems with interstitial cystitis, residual peripheral neuropathy from her chemotherapy, and a conversion disorder which she tells me means she cannot be left alone. (She cannot drive either, of course). All of this is overshadowed by her severe cardiomyopathy, which is felt to be related to her chemotherapy: Her total dose of doxorubicin was 240 mg/m; she received bevacizumab subsequently.  her defibrillator has never gone off.  she complains of night sweats, insomnia, severe fatigue, pain "all over", which she describes as achy. She also complains of a sore throat and hoarseness. She has palpitations. Her ankles swell. She is short of breath even at rest sometimes. She sleeps on 2 pillows. She has heartburn and a history of ulcer. She has abdominal pain  sometimes. She has urinary stress incontinence. Her peripheral neuropathy is grade 1 and stable. There've been no recent fevers, rash or bleeding. A detailed review of systems otherwise was noncontributory today     PAST MEDICAL HISTORY: Past Medical History  Diagnosis Date  . 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 Cardiac MRI 05/2011 EF 32%. ICD implantation 10/2011 ([employer])  . Peripheral neuropathy     chemo- induced  . Hypertension     c/b orthostatic hypotention  . Depression   . Palpitation     normal sinus rhythm only on 21 day heart monitor  . Obesity   . Fibromyalgia   . GERD (gastroesophageal reflux disease)   . Heart murmur   . Breast calcifications     right breast  . Interstitial cystitis   . Anxiety   . Nonischemic cardiomyopathy     related to chemo; EF 30% 05/2011  . ICD (implantable cardiac defibrillator) in place   . Pneumonia 2000's    "once"  . Type II diabetes mellitus   . Blood transfusion 1980's    1987 or 1988  . Anemia 1980's    [id number]  . History of stomach ulcers   . Breast cancer     right;  . Conversion disorder   . 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   . 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 Laparoscopic endometriosis fulguration  1990's  . Cardiac defibrillator placement  11/03/11  . Tonsillectomy  1980's  . Tubal ligation  1990's  . Vaginal hysterectomy  2000's  . Cholecystectomy  ~ 2009  . Breast lumpectomy  2008; 2013    right  . Port-a-cath removal  2010?    left chest; placed in 2008  . Nasal sinus surgery    . Implantable cardioverter defibrillator implant N/A 11/03/2011    Procedure: IMPLANTABLE CARDIOVERTER DEFIBRILLATOR IMPLANT;  Surgeon: Deboraha Sprang, MD;  Location: [facility] CATH LAB;  Service: Cardiovascular;  Laterality: N/A;    FAMILY HISTORY Family History  Problem Relation Age of Onset  . Coronary artery  disease    . Heart attack    . Heart disease Mother   . Heart disease Maternal Uncle   . Cancer Paternal Uncle     colon  . Heart disease Maternal Grandmother   . Cancer Paternal Grandmother     ovarian   the patient's father is still living, at age 76. He has a history of Crohn's disease. The patient's mother is living, age 27. She has a history of fibromyalgia. The patient has 3 half-brothers, one sister. The patient's father's mother had either ovarian cancer or cervical cancer. The patient was tested in June 2008 for mutations in the BRCA1 and 2 genes. Complete sequencing found no mutations   GYNECOLOGIC HISTORY:  No LMP recorded. Patient has had a hysterectomy. menarche age 43, first live birth age 88. She is GX P1. She underwent simple hysterectomy, no salpingo-oophorectomy in 2007.   SOCIAL HISTORY:   she used to work for a [employer] in [employer]. Her husband of 23 years, [name], in addition to his regular job is Theme park manager of their [employer]. There daughter,Maya, is currently 13. The patient attends the [employer] [facility] [employer] locally.  ADVANCED DIRECTIVES: Not in place. The patient's husband is her healthcare power of attorney   HEALTH MAINTENANCE: History  Substance Use Topics  . Smoking status: Never Smoker   . Smokeless tobacco: Never Used  . Alcohol Use: Yes     Comment: 11/03/11 "maybe once a month"     Colonoscopy:  PAP:  Bone density:  Lipid panel:  Allergies  Allergen Reactions  . Other Rash    Chloraprep  . Reglan [Metoclopramide Hcl] Anxiety    Current Outpatient Prescriptions  Medication Sig Dispense Refill  . albuterol (PROAIR HFA) 108 (90 BASE) MCG/ACT inhaler Inhale 1-2 puffs into the lungs every 6 (six) hours as needed for wheezing or shortness of breath. Pt needs appt for more refills 8.5 each 0  . ALPRAZolam (XANAX) 1 MG tablet Take 1 tablet (1 mg total) by mouth at bedtime. 30 tablet 0  . B Complex-C (B-COMPLEX WITH VITAMIN C) tablet Take 1 tablet by  mouth daily.    . baclofen (LIORESAL) 10 MG tablet TAKE 1 TABLET BY MOUTH 2 (TWO) TIMES DAILY AS NEEDED FOR MUSCLE SPASMS. 60 tablet 0  . carvedilol (COREG) 25 MG tablet TAKE 1 TABLET BY MOUTH TWICE A DAY WITH A MEAL, MUST HAVE FOLLOW UP APPOINTMENT FOR FURTHER REFILLS 60 tablet 6  . Cholecalciferol (VITAMIN D3) 5000 UNITS CAPS Take 1 capsule by mouth daily.     . Coenzyme Q10 (CO Q-10) 100 MG CAPS Take 100 mg by mouth daily.    . Cyanocobalamin 2500 MCG SUBL Place 2,500 mcg under the tongue daily.    . digoxin (LANOXIN) 0.125 MG tablet Take 0.5 tablets (0.0625 mg total) by mouth daily. 30 tablet 6  . divalproex (DEPAKOTE ER) 250 MG 24 hr tablet TAKE 1 TABLET (250 MG TOTAL) BY MOUTH DAILY. 90 tablet 2  . GLUCOSAMINE-CHONDROITIN PO Take 1,200 mg by mouth daily.    Marland Kitchen loratadine (CLARITIN) 10 MG tablet Take 10 mg by mouth daily.    . metolazone (ZAROXOLYN) 2.5 MG tablet TAKE 1 TABLET (2.5 MG TOTAL) BY MOUTH AS DIRECTED. 5 tablet 1  . montelukast (SINGULAIR) 10 MG tablet TAKE 1 TABLET (10 MG TOTAL) BY MOUTH AT BEDTIME. 30 tablet 0  . Multiple Vitamin (MULTIVITAMIN WITH MINERALS) TABS Take 1 tablet by mouth daily.    Marland Kitchen omeprazole (PRILOSEC) 20 MG capsule Take 1 capsule (20 mg total) by mouth daily. 30 capsule 5  . PATADAY 0.2 % SOLN     . potassium chloride SA (K-DUR,KLOR-CON) 20 MEQ tablet Take 1 tablet (20 mEq total) by mouth 3 (three) times a week. Every Mon, Wed and Fri with torsemide    . PRODIGY NO CODING BLOOD GLUC test strip     . spironolactone (ALDACTONE) 25 MG tablet Take 0.5 tablets (12.5 mg total) by mouth daily. 15 tablet 6  . torsemide (DEMADEX) 20 MG tablet Take 1 tablet (20 mg total) by mouth 3 (three) times a week. Every Mon, Wed and Fri    . amoxicillin-clavulanate (AUGMENTIN) 875-125 MG per tablet Take 1 tablet by mouth 2 (two) times daily. 14 tablet 0  . fluconazole (DIFLUCAN) 150 MG tablet Take 1 tablet (150 mg total) by mouth daily. Take 1 tablet today and repeat dose in 3 days.  2 tablet 0  . nystatin (MYCOSTATIN) 100000 UNIT/ML suspension Take 5 mLs (500,000 Units total) by mouth 4 (four) times daily. (Patient not taking: Reported on 10/24/2014) 60 mL 0  . pentosan polysulfate (ELMIRON) 100 MG capsule Take 1  capsule (100 mg total) by mouth 2 (two) times daily. (Patient not taking: Reported on 10/24/2014) 60 capsule 3   No current facility-administered medications for this visit.    OBJECTIVE: Young African-American woman in no acute distress Filed Vitals:   10/24/14 1237  BP: 89/45  Pulse: 86  Temp: 97.7 F (36.5 C)  Resp: 18     Body mass index is 39.49 kg/(m^2).    ECOG FS:2 - Symptomatic, <50% confined to bed  Skin: warm, dry  HEENT: sclerae anicteric, conjunctivae pink, oropharynx clear. No thrush or mucositis.  Lymph Nodes: No cervical or supraclavicular lymphadenopathy  Lungs: clear to auscultation bilaterally, no rales, wheezes, or rhonci  Heart: regular rate and rhythm  Abdomen: obese, soft, non tender, positive bowel sounds  Musculoskeletal: No focal spinal tenderness, no peripheral edema  Neuro: non focal, well oriented, positive affect  Breasts: right breast status post lumpectomy and radiation. No evidence of recurrent disease. Right axilla benign. Left breast unremarkable.   LAB RESULTS:  CMP     Component Value Date/Time   NA 141 09/19/2014 1133   NA 135* 05/17/2014 1047   K 3.5 09/19/2014 1133   K 4.7 05/17/2014 1047   CL 97 05/17/2014 1047   CO2 23 09/19/2014 1133   CO2 23 05/17/2014 1047   GLUCOSE 103 09/19/2014 1133   GLUCOSE 80 05/17/2014 1047   BUN 11.6 09/19/2014 1133   BUN 13 05/17/2014 1047   CREATININE 0.9 09/19/2014 1133   CREATININE 0.90 05/17/2014 1047   CREATININE 0.77 08/25/2012 1706   CALCIUM 8.6 09/19/2014 1133   CALCIUM 9.4 05/17/2014 1047   PROT 7.2 09/19/2014 1133   PROT 8.1 06/02/2012 1110   ALBUMIN 3.4* 09/19/2014 1133   ALBUMIN 4.1 06/02/2012 1110   AST 21 09/19/2014 1133   AST 43* 06/02/2012 1110    ALT 14 09/19/2014 1133   ALT 51* 06/02/2012 1110   ALKPHOS 87 09/19/2014 1133   ALKPHOS 95 06/02/2012 1110   BILITOT 1.62* 09/19/2014 1133   BILITOT 0.5 06/02/2012 1110   GFRNONAA 77* 05/17/2014 1047   GFRAA 89* 05/17/2014 1047    INo results found for: SPEP, UPEP  Lab Results  Component Value Date   WBC 6.8 09/19/2014   NEUTROABS 4.4 09/19/2014   HGB 12.7 09/19/2014   HCT 40.2 09/19/2014   MCV 90.8 09/19/2014   PLT 165 09/19/2014      Chemistry      Component Value Date/Time   NA 141 09/19/2014 1133   NA 135* 05/17/2014 1047   K 3.5 09/19/2014 1133   K 4.7 05/17/2014 1047   CL 97 05/17/2014 1047   CO2 23 09/19/2014 1133   CO2 23 05/17/2014 1047   BUN 11.6 09/19/2014 1133   BUN 13 05/17/2014 1047   CREATININE 0.9 09/19/2014 1133   CREATININE 0.90 05/17/2014 1047   CREATININE 0.77 08/25/2012 1706      Component Value Date/Time   CALCIUM 8.6 09/19/2014 1133   CALCIUM 9.4 05/17/2014 1047   ALKPHOS 87 09/19/2014 1133   ALKPHOS 95 06/02/2012 1110   AST 21 09/19/2014 1133   AST 43* 06/02/2012 1110   ALT 14 09/19/2014 1133   ALT 51* 06/02/2012 1110   BILITOT 1.62* 09/19/2014 1133   BILITOT 0.5 06/02/2012 1110       Lab Results  Component Value Date   LABCA2 24 08/19/2011    No components found for: [id number]  No results for input(s): INR in the last 168 hours.  Urinalysis  Component Value Date/Time   COLORURINE YELLOW 05/02/2008 1222   APPEARANCEUR CLEAR 05/02/2008 1222   LABSPEC 1.008 05/02/2008 1222   PHURINE 6.0 05/02/2008 1222   GLUCOSEU NEGATIVE 05/02/2008 1222   HGBUR TRACE* 05/02/2008 [place] 05/02/2008 [place] 05/02/2008 [place] 05/02/2008 1222   UROBILINOGEN 0.2 05/02/2008 1222   NITRITE NEGATIVE 05/02/2008 1222   LEUKOCYTESUR NEGATIVE 05/02/2008 1222    STUDIES: No results found. CLINICAL DATA: Status post right lumpectomy, radiation therapy and chemotherapy for breast cancer  in 2008. The patient had an additional right breast excision on 08/26/2011 with benign results.  EXAM: DIGITAL DIAGNOSTIC BILATERAL MAMMOGRAM WITH CAD  COMPARISON: Previous examinations.  ACR Breast Density Category b: There are scattered areas of fibroglandular density.  FINDINGS: Post lumpectomy and postradiation changes on the right. Calcifications compatible with fat necrosis at the lumpectomy site. Additional calcified oil cyst in the upper right breast. No findings suspicious for malignancy in either breast.  Mammographic images were processed with CAD.  IMPRESSION: No evidence of malignancy.  RECOMMENDATION: Bilateral screening mammogram in 1 year.  I have discussed the findings and recommendations with the patient. Results were also provided in writing at the conclusion of the visit. If applicable, a reminder letter will be sent to the patient regarding the next appointment.  BI-RADS CATEGORY 2: Benign.   Electronically Signed  By: Claudie Revering M.D.  On: 07/25/2014 13:22  ASSESSMENT: 44 y.o.  BRCA negative [place] woman   (1) status post right lumpectomy and sentinel lymph node sampling 08/22/2006 for a pT2 pN1, stage IIB invasive ductal carcinoma, grade 3, estrogen receptor 99% positive, progesterone receptor 98% positive, with an MIB-1 of 16% and no HER-2 amplification   (2) completion axillary dissection 09/07/2006 showed no additional positive lymph nodes (total of 17 lymph nodes sampled, one positive)  (3) adjuvant chemotherapy followed ECOG 10/12/2001, arm D   (a) started 10/21/2006 and consisted of cyclophosphamide and doxorubicin given in dose dense fashion 4, completed 12/02/2006 (total doxorubicin dose 240 mg/m), followed by   (b) weekly paclitaxel 12 together with either bevacizumab or placebo given every 3 weeks, completed 03/03/2007    (c) bevacizumab was to have been continued, but protocol-mandated echocardiogram 03/06/2007  showed the patient's previously normal left ventricular ejection fraction to have dropped to 35%.  (4)  completed radiation treatment to the right breast 05/17/2007-- 5040 cGy plus a 1260 cGy boost  (5) received letrozole between December 2008 and March 2013  (6) severe cardiomyopathy with most recent echocardiogram 06/24/2014 showing an ejection fraction  20 -25%  (a) ICD implanted May 2013  (7) fibromyalgia, with chronic pain and some residual grade 1 neuropathy from chemotherapy   (8) sleep apnea documented through polysomnography 07/24/2013  (9) conversion disorder with no evidence of myotonia or myotonic dystrophy on exam at [place] (under Rushie Goltz MD 11/15/2013)     PLAN: [name], her family, and I spent about 40 minutes in this appointment. From a breast cancer point of view she is doing well. She is now 8 years out from her definitive surgery with no evidence of recurrent disease. She completed her antiestrogen treatment 3 years ago, and has been following up with yearly visits since then. However, her multiple comorbidities overshadow her cancer diagnosis. I am referring her to Dr. Monico Hoar office of [facility] to see if he can manage her Conversion Disorder. So far she has been unable to locate a physician who can.  The labs were reviewed in detail her bilirubin is elevated to 1.6. I have ordered a CT of the abdomen to evaluate this along with the complaints of nausea and abdominal pain and distention. She plans to visit a GI specialist, but wants this test done in advance of making that appointment. I am happy to oblige, but I suggested she go ahead and be in the process of making this appointment anyway for evaluation.   For her sinusitis I am prescribing a course of augmentin to be followed by fluconazole as she is subject to frequent yeast infections.   [name] will return in 1 year for labs and a final follow up visit with Dr .Jana Hakim. At this time, she will  be eligible to "graduate" from routine follow up. Her care is a mixture of several specialists who will take great care of her. She understands and agrees with this plan. She has been encouraged to call with any issues that might arise before her next visit here.    Laurie Panda, NP   10/24/2014 4:28 PM

## 2014-10-25 ENCOUNTER — Encounter: Payer: Self-pay | Admitting: Nurse Practitioner

## 2014-10-25 ENCOUNTER — Other Ambulatory Visit: Payer: Self-pay | Admitting: Pulmonary Disease

## 2014-10-25 NOTE — Telephone Encounter (Signed)
Pt needs PFT and OV Singular refilled #[phone] no additional refills.

## 2014-10-29 DIAGNOSIS — N3010 Interstitial cystitis (chronic) without hematuria: Secondary | ICD-10-CM | POA: Diagnosis not present

## 2014-10-31 DIAGNOSIS — M797 Fibromyalgia: Secondary | ICD-10-CM | POA: Diagnosis not present

## 2014-10-31 DIAGNOSIS — I427 Cardiomyopathy due to drug and external agent: Secondary | ICD-10-CM | POA: Diagnosis not present

## 2014-10-31 DIAGNOSIS — F449 Dissociative and conversion disorder, unspecified: Secondary | ICD-10-CM | POA: Diagnosis not present

## 2014-10-31 DIAGNOSIS — G620 Drug-induced polyneuropathy: Secondary | ICD-10-CM | POA: Diagnosis not present

## 2014-10-31 DIAGNOSIS — I5022 Chronic systolic (congestive) heart failure: Secondary | ICD-10-CM | POA: Diagnosis not present

## 2014-10-31 DIAGNOSIS — T451X5S Adverse effect of antineoplastic and immunosuppressive drugs, sequela: Secondary | ICD-10-CM | POA: Diagnosis not present

## 2014-11-01 ENCOUNTER — Ambulatory Visit (HOSPITAL_COMMUNITY)
Admission: RE | Admit: 2014-11-01 | Discharge: 2014-11-01 | Disposition: A | Discharge status: Home / Self Care | Payer: Medicare Other | Source: Ambulatory Visit | Attending: Nurse Practitioner | Admitting: Nurse Practitioner

## 2014-11-01 ENCOUNTER — Encounter (HOSPITAL_COMMUNITY): Payer: Self-pay

## 2014-11-01 DIAGNOSIS — R109 Unspecified abdominal pain: Secondary | ICD-10-CM | POA: Insufficient documentation

## 2014-11-01 DIAGNOSIS — K7689 Other specified diseases of liver: Secondary | ICD-10-CM | POA: Diagnosis not present

## 2014-11-01 DIAGNOSIS — R17 Unspecified jaundice: Secondary | ICD-10-CM

## 2014-11-01 DIAGNOSIS — Z853 Personal history of malignant neoplasm of breast: Secondary | ICD-10-CM | POA: Insufficient documentation

## 2014-11-01 DIAGNOSIS — R160 Hepatomegaly, not elsewhere classified: Secondary | ICD-10-CM | POA: Diagnosis not present

## 2014-11-01 DIAGNOSIS — Z90710 Acquired absence of both cervix and uterus: Secondary | ICD-10-CM | POA: Diagnosis not present

## 2014-11-01 DIAGNOSIS — C50911 Malignant neoplasm of unspecified site of right female breast: Secondary | ICD-10-CM

## 2014-11-01 MED ORDER — IOHEXOL 300 MG/ML  SOLN
100.0000 mL | Freq: Once | INTRAMUSCULAR | Status: AC | PRN
  Administered 2014-11-01: 100 mL via INTRAVENOUS

## 2014-11-04 ENCOUNTER — Other Ambulatory Visit: Payer: Self-pay | Admitting: Nurse Practitioner

## 2014-11-04 DIAGNOSIS — K769 Liver disease, unspecified: Secondary | ICD-10-CM

## 2014-11-09 ENCOUNTER — Other Ambulatory Visit (HOSPITAL_COMMUNITY): Payer: Self-pay | Admitting: Internal Medicine

## 2014-11-09 ENCOUNTER — Other Ambulatory Visit: Payer: Self-pay | Admitting: Nurse Practitioner

## 2014-11-11 ENCOUNTER — Other Ambulatory Visit: Payer: Self-pay | Admitting: Family

## 2014-11-12 ENCOUNTER — Other Ambulatory Visit: Payer: Self-pay | Admitting: Nurse Practitioner

## 2014-11-12 ENCOUNTER — Telehealth: Payer: Self-pay | Admitting: *Deleted

## 2014-11-12 ENCOUNTER — Other Ambulatory Visit: Payer: Self-pay | Admitting: *Deleted

## 2014-11-12 ENCOUNTER — Other Ambulatory Visit: Payer: Self-pay | Admitting: Oncology

## 2014-11-12 MED ORDER — FLUCONAZOLE 150 MG PO TABS: 150.0000 mg | ORAL_TABLET | Freq: Every day | ORAL | Status: DC

## 2014-11-12 MED ORDER — AMOXICILLIN-POT CLAVULANATE 875-125 MG PO TABS: 1.0000 | ORAL_TABLET | Freq: Two times a day (BID) | ORAL | Status: DC

## 2014-11-12 NOTE — Telephone Encounter (Signed)
Called pt to inform her that she can pick-up Rx for antibiotic and Diflucan from her pharmacy. I also left a message to let her know we are reviewing her chart to see what are best options  for her since she cannot get a MRI and we will be in contact with her about that decision. Message to be forwarded to Gentry Fitz, NP.

## 2014-11-13 ENCOUNTER — Other Ambulatory Visit: Payer: Self-pay | Admitting: Nurse Practitioner

## 2014-11-13 DIAGNOSIS — R17 Unspecified jaundice: Secondary | ICD-10-CM

## 2014-11-13 DIAGNOSIS — C50911 Malignant neoplasm of unspecified site of right female breast: Secondary | ICD-10-CM

## 2014-11-18 ENCOUNTER — Other Ambulatory Visit: Payer: Self-pay | Admitting: Pulmonary Disease

## 2014-11-19 ENCOUNTER — Encounter: Payer: Self-pay | Admitting: Internal Medicine

## 2014-11-21 ENCOUNTER — Telehealth: Payer: Self-pay | Admitting: *Deleted

## 2014-11-21 NOTE — Telephone Encounter (Signed)
VM message from patient received @2 :25 pm. Call back to patient @ 3:20 pm.   She states she has completed her Augmentin and diflucan. She states her sinusitis-type symptoms resolved for a day or so but are now back. States her nasal drainage is creamy in color and has occasional greenish/brown coloration. Denies fever. Denies cough. She states this has been going on a long time and has seen her PCP and an ENT doctor about it in the last few months.  She is asking for more antibiotics.

## 2014-11-21 NOTE — Telephone Encounter (Signed)
Patient is going to have to return to her ENT. I cannot continue to prescribe antibiotics for a chronic condition.

## 2014-11-21 NOTE — Telephone Encounter (Signed)
Will advise pt of this. Thanks.

## 2014-11-21 NOTE — Telephone Encounter (Signed)
TC made to patient and advised her to follow up with her ENT physician. She voiced understanding.

## 2014-11-22 ENCOUNTER — Telehealth: Payer: Self-pay | Admitting: Family

## 2014-11-22 NOTE — Telephone Encounter (Signed)
Patient called regarding her sinus infection. She has taken 2 full weeks of amoxicillin-clavulanate (AUGMENTIN) 875-125 MG per tablet [[id number]] and she feels like its getting better but she is needing more. Pharmacy is [facility] on [address].

## 2014-11-22 NOTE — Telephone Encounter (Signed)
2 doses of Augmentin should be more than enough for a sinus infection. If she still continues to experience symptoms she needs to be seen in the office.

## 2014-11-26 ENCOUNTER — Ambulatory Visit (INDEPENDENT_AMBULATORY_CARE_PROVIDER_SITE_OTHER): Payer: Medicare Other | Admitting: *Deleted

## 2014-11-26 ENCOUNTER — Inpatient Hospital Stay (HOSPITAL_COMMUNITY)
Admission: EM | Admit: 2014-11-26 | Discharge: 2014-11-29 | DRG: 292 | Disposition: A | Discharge status: Home / Self Care | Payer: Medicare Other | Attending: Cardiovascular Disease | Admitting: Cardiovascular Disease

## 2014-11-26 ENCOUNTER — Emergency Department (HOSPITAL_COMMUNITY): Payer: Medicare Other

## 2014-11-26 ENCOUNTER — Encounter (HOSPITAL_COMMUNITY): Payer: Self-pay | Admitting: Emergency Medicine

## 2014-11-26 DIAGNOSIS — Z95810 Presence of automatic (implantable) cardiac defibrillator: Secondary | ICD-10-CM | POA: Diagnosis not present

## 2014-11-26 DIAGNOSIS — F329 Major depressive disorder, single episode, unspecified: Secondary | ICD-10-CM | POA: Diagnosis present

## 2014-11-26 DIAGNOSIS — Z8711 Personal history of peptic ulcer disease: Secondary | ICD-10-CM | POA: Diagnosis not present

## 2014-11-26 DIAGNOSIS — E1159 Type 2 diabetes mellitus with other circulatory complications: Secondary | ICD-10-CM | POA: Diagnosis present

## 2014-11-26 DIAGNOSIS — Z9221 Personal history of antineoplastic chemotherapy: Secondary | ICD-10-CM

## 2014-11-26 DIAGNOSIS — E669 Obesity, unspecified: Secondary | ICD-10-CM | POA: Diagnosis present

## 2014-11-26 DIAGNOSIS — I5023 Acute on chronic systolic (congestive) heart failure: Principal | ICD-10-CM | POA: Diagnosis present

## 2014-11-26 DIAGNOSIS — I10 Essential (primary) hypertension: Secondary | ICD-10-CM | POA: Diagnosis present

## 2014-11-26 DIAGNOSIS — I5022 Chronic systolic (congestive) heart failure: Secondary | ICD-10-CM

## 2014-11-26 DIAGNOSIS — Z6838 Body mass index (BMI) 38.0-38.9, adult: Secondary | ICD-10-CM | POA: Diagnosis not present

## 2014-11-26 DIAGNOSIS — Z91048 Other nonmedicinal substance allergy status: Secondary | ICD-10-CM

## 2014-11-26 DIAGNOSIS — C50911 Malignant neoplasm of unspecified site of right female breast: Secondary | ICD-10-CM | POA: Diagnosis present

## 2014-11-26 DIAGNOSIS — N179 Acute kidney failure, unspecified: Secondary | ICD-10-CM | POA: Diagnosis present

## 2014-11-26 DIAGNOSIS — J45909 Unspecified asthma, uncomplicated: Secondary | ICD-10-CM | POA: Diagnosis present

## 2014-11-26 DIAGNOSIS — E875 Hyperkalemia: Secondary | ICD-10-CM | POA: Diagnosis present

## 2014-11-26 DIAGNOSIS — Z90710 Acquired absence of both cervix and uterus: Secondary | ICD-10-CM

## 2014-11-26 DIAGNOSIS — K219 Gastro-esophageal reflux disease without esophagitis: Secondary | ICD-10-CM | POA: Diagnosis present

## 2014-11-26 DIAGNOSIS — I951 Orthostatic hypotension: Secondary | ICD-10-CM | POA: Diagnosis present

## 2014-11-26 DIAGNOSIS — T451X5A Adverse effect of antineoplastic and immunosuppressive drugs, initial encounter: Secondary | ICD-10-CM | POA: Diagnosis present

## 2014-11-26 DIAGNOSIS — I428 Other cardiomyopathies: Secondary | ICD-10-CM

## 2014-11-26 DIAGNOSIS — Z853 Personal history of malignant neoplasm of breast: Secondary | ICD-10-CM

## 2014-11-26 DIAGNOSIS — F449 Dissociative and conversion disorder, unspecified: Secondary | ICD-10-CM | POA: Diagnosis not present

## 2014-11-26 DIAGNOSIS — R109 Unspecified abdominal pain: Secondary | ICD-10-CM | POA: Diagnosis present

## 2014-11-26 DIAGNOSIS — I429 Cardiomyopathy, unspecified: Secondary | ICD-10-CM

## 2014-11-26 DIAGNOSIS — G620 Drug-induced polyneuropathy: Secondary | ICD-10-CM | POA: Diagnosis present

## 2014-11-26 DIAGNOSIS — Z792 Long term (current) use of antibiotics: Secondary | ICD-10-CM | POA: Diagnosis not present

## 2014-11-26 DIAGNOSIS — F419 Anxiety disorder, unspecified: Secondary | ICD-10-CM | POA: Diagnosis present

## 2014-11-26 DIAGNOSIS — Z8701 Personal history of pneumonia (recurrent): Secondary | ICD-10-CM

## 2014-11-26 DIAGNOSIS — I427 Cardiomyopathy due to drug and external agent: Secondary | ICD-10-CM | POA: Diagnosis present

## 2014-11-26 DIAGNOSIS — E119 Type 2 diabetes mellitus without complications: Secondary | ICD-10-CM | POA: Diagnosis present

## 2014-11-26 DIAGNOSIS — I959 Hypotension, unspecified: Secondary | ICD-10-CM | POA: Diagnosis present

## 2014-11-26 DIAGNOSIS — J449 Chronic obstructive pulmonary disease, unspecified: Secondary | ICD-10-CM | POA: Diagnosis present

## 2014-11-26 DIAGNOSIS — N289 Disorder of kidney and ureter, unspecified: Secondary | ICD-10-CM | POA: Diagnosis present

## 2014-11-26 DIAGNOSIS — Z888 Allergy status to other drugs, medicaments and biological substances status: Secondary | ICD-10-CM | POA: Diagnosis not present

## 2014-11-26 DIAGNOSIS — R079 Chest pain, unspecified: Secondary | ICD-10-CM | POA: Diagnosis not present

## 2014-11-26 DIAGNOSIS — M797 Fibromyalgia: Secondary | ICD-10-CM | POA: Diagnosis present

## 2014-11-26 DIAGNOSIS — I272 Other secondary pulmonary hypertension: Secondary | ICD-10-CM | POA: Diagnosis present

## 2014-11-26 DIAGNOSIS — F411 Generalized anxiety disorder: Secondary | ICD-10-CM | POA: Diagnosis present

## 2014-11-26 DIAGNOSIS — R404 Transient alteration of awareness: Secondary | ICD-10-CM | POA: Diagnosis not present

## 2014-11-26 DIAGNOSIS — R55 Syncope and collapse: Secondary | ICD-10-CM | POA: Diagnosis not present

## 2014-11-26 DIAGNOSIS — R0602 Shortness of breath: Secondary | ICD-10-CM | POA: Diagnosis not present

## 2014-11-26 DIAGNOSIS — I152 Hypertension secondary to endocrine disorders: Secondary | ICD-10-CM | POA: Diagnosis present

## 2014-11-26 LAB — BRAIN NATRIURETIC PEPTIDE: B Natriuretic Peptide: 1093 pg/mL — ABNORMAL HIGH (ref 0.0–100.0)

## 2014-11-26 LAB — CBC
HCT: 40.7 % (ref 36.0–46.0)
HEMATOCRIT: 41.6 % (ref 36.0–46.0)
HEMOGLOBIN: 13.6 g/dL (ref 12.0–15.0)
HEMOGLOBIN: 13.8 g/dL (ref 12.0–15.0)
MCH: 30.1 pg (ref 26.0–34.0)
MCH: 30.1 pg (ref 26.0–34.0)
MCHC: 33.4 g/dL (ref 30.0–36.0)
MCHC: 33.2 g/dL (ref 30.0–36.0)
MCV: 90 fL (ref 78.0–100.0)
MCV: 90.6 fL (ref 78.0–100.0)
Platelets: 162 10*3/uL (ref 150–400)
Platelets: 152 10*3/uL (ref 150–400)
RBC: 4.52 MIL/uL (ref 3.87–5.11)
RBC: 4.59 MIL/uL (ref 3.87–5.11)
RDW: 15.8 % — AB (ref 11.5–15.5)
RDW: 16 % — AB (ref 11.5–15.5)
WBC: 5.4 10*3/uL (ref 4.0–10.5)
WBC: 5.5 10*3/uL (ref 4.0–10.5)

## 2014-11-26 LAB — PROTIME-INR
INR: 1.36 (ref 0.00–1.49)
Prothrombin Time: 16.9 seconds — ABNORMAL HIGH (ref 11.6–15.2)

## 2014-11-26 LAB — BASIC METABOLIC PANEL
Anion gap: 10 (ref 5–15)
BUN: 21 mg/dL — ABNORMAL HIGH (ref 6–20)
CO2: 26 mmol/L (ref 22–32)
Calcium: 8.6 mg/dL — ABNORMAL LOW (ref 8.9–10.3)
Chloride: 101 mmol/L (ref 101–111)
Creatinine, Ser: 1.42 mg/dL — ABNORMAL HIGH (ref 0.44–1.00)
GFR calc non Af Amer: 44 mL/min — ABNORMAL LOW (ref >60)
GFR, EST AFRICAN AMERICAN: 51 mL/min — AB (ref >60)
GLUCOSE: 87 mg/dL (ref 65–99)
POTASSIUM: 4.6 mmol/L (ref 3.5–5.1)
Sodium: 137 mmol/L (ref 135–145)

## 2014-11-26 LAB — I-STAT TROPONIN, ED: TROPONIN I, POC: 0.02 ng/mL (ref 0.00–0.08)

## 2014-11-26 LAB — CBG MONITORING, ED: GLUCOSE-CAPILLARY: 81 mg/dL (ref 65–99)

## 2014-11-26 LAB — PREGNANCY, URINE: Preg Test, Ur: NEGATIVE

## 2014-11-26 LAB — POC URINE PREG, ED: Preg Test, Ur: NEGATIVE

## 2014-11-26 LAB — GLUCOSE, CAPILLARY: Glucose-Capillary: 133 mg/dL — ABNORMAL HIGH (ref 65–99)

## 2014-11-26 MED ORDER — PANTOPRAZOLE SODIUM 40 MG PO TBEC
80.0000 mg | DELAYED_RELEASE_TABLET | Freq: Every day | ORAL | Status: DC
  Administered 2014-11-26 – 2014-11-29 (×4): 80 mg via ORAL
  Filled 2014-11-26 (×5): qty 2

## 2014-11-26 MED ORDER — FUROSEMIDE 10 MG/ML IJ SOLN
40.0000 mg | Freq: Once | INTRAMUSCULAR | Status: AC
Start: 2014-11-26 — End: 2014-11-26
  Administered 2014-11-26: 40 mg via INTRAVENOUS
  Filled 2014-11-26: qty 4

## 2014-11-26 MED ORDER — CYANOCOBALAMIN 2500 MCG SL SUBL: 2500.0000 ug | SUBLINGUAL_TABLET | Freq: Every day | SUBLINGUAL | Status: DC

## 2014-11-26 MED ORDER — ONDANSETRON HCL 4 MG/2ML IJ SOLN
4.0000 mg | Freq: Four times a day (QID) | INTRAMUSCULAR | Status: DC | PRN
  Administered 2014-11-27 – 2014-11-28 (×3): 4 mg via INTRAVENOUS
  Filled 2014-11-26 (×3): qty 2

## 2014-11-26 MED ORDER — CARVEDILOL 25 MG PO TABS
25.0000 mg | ORAL_TABLET | Freq: Two times a day (BID) | ORAL | Status: DC
  Administered 2014-11-26 – 2014-11-27 (×2): 25 mg via ORAL
  Filled 2014-11-26 (×4): qty 1

## 2014-11-26 MED ORDER — BACLOFEN 10 MG PO TABS
10.0000 mg | ORAL_TABLET | Freq: Two times a day (BID) | ORAL | Status: DC | PRN
  Filled 2014-11-26: qty 1

## 2014-11-26 MED ORDER — MONTELUKAST SODIUM 10 MG PO TABS
10.0000 mg | ORAL_TABLET | Freq: Every day | ORAL | Status: DC
  Administered 2014-11-26 – 2014-11-28 (×3): 10 mg via ORAL
  Filled 2014-11-26 (×4): qty 1

## 2014-11-26 MED ORDER — SODIUM CHLORIDE 0.9 % IJ SOLN
3.0000 mL | Freq: Two times a day (BID) | INTRAMUSCULAR | Status: DC
  Administered 2014-11-26: 10 mL via INTRAVENOUS
  Administered 2014-11-27 – 2014-11-29 (×5): 3 mL via INTRAVENOUS

## 2014-11-26 MED ORDER — B COMPLEX-C PO TABS
1.0000 | ORAL_TABLET | Freq: Every day | ORAL | Status: DC
  Administered 2014-11-26 – 2014-11-29 (×4): 1 via ORAL
  Filled 2014-11-26 (×4): qty 1

## 2014-11-26 MED ORDER — ACETAMINOPHEN 325 MG PO TABS
650.0000 mg | ORAL_TABLET | ORAL | Status: DC | PRN
  Administered 2014-11-27: 650 mg via ORAL
  Filled 2014-11-26: qty 2

## 2014-11-26 MED ORDER — SODIUM CHLORIDE 0.9 % IV SOLN: 250.0000 mL | INTRAVENOUS | Status: DC | PRN

## 2014-11-26 MED ORDER — SODIUM CHLORIDE 0.9 % IV BOLUS (SEPSIS)
500.0000 mL | Freq: Once | INTRAVENOUS | Status: AC
  Administered 2014-11-26: 500 mL via INTRAVENOUS

## 2014-11-26 MED ORDER — ALBUTEROL SULFATE (2.5 MG/3ML) 0.083% IN NEBU: 2.5000 mg | INHALATION_SOLUTION | Freq: Four times a day (QID) | RESPIRATORY_TRACT | Status: DC | PRN

## 2014-11-26 MED ORDER — ADULT MULTIVITAMIN W/MINERALS CH
1.0000 | ORAL_TABLET | Freq: Every day | ORAL | Status: DC
  Administered 2014-11-26 – 2014-11-29 (×4): 1 via ORAL
  Filled 2014-11-26 (×4): qty 1

## 2014-11-26 MED ORDER — SPIRONOLACTONE 12.5 MG HALF TABLET
12.5000 mg | ORAL_TABLET | Freq: Every day | ORAL | Status: DC
  Administered 2014-11-26 – 2014-11-29 (×4): 12.5 mg via ORAL
  Filled 2014-11-26 (×4): qty 1

## 2014-11-26 MED ORDER — DIGOXIN 0.0625 MG HALF TABLET
0.0625 mg | ORAL_TABLET | Freq: Every day | ORAL | Status: DC
  Administered 2014-11-27 – 2014-11-29 (×3): 0.0625 mg via ORAL
  Filled 2014-11-26 (×3): qty 1

## 2014-11-26 MED ORDER — SODIUM CHLORIDE 0.9 % IJ SOLN: 3.0000 mL | INTRAMUSCULAR | Status: DC | PRN

## 2014-11-26 MED ORDER — TRAMADOL HCL 50 MG PO TABS
50.0000 mg | ORAL_TABLET | Freq: Every day | ORAL | Status: DC | PRN
Start: 2014-11-26 — End: 2014-11-29
  Administered 2014-11-26 – 2014-11-29 (×2): 50 mg via ORAL
  Filled 2014-11-26 (×2): qty 1

## 2014-11-26 MED ORDER — BUDESONIDE 0.25 MG/2ML IN SUSP
0.2500 mg | Freq: Two times a day (BID) | RESPIRATORY_TRACT | Status: DC
  Administered 2014-11-27 – 2014-11-29 (×4): 0.25 mg via RESPIRATORY_TRACT
  Filled 2014-11-26 (×8): qty 2

## 2014-11-26 MED ORDER — DIVALPROEX SODIUM ER 250 MG PO TB24
250.0000 mg | ORAL_TABLET | Freq: Every day | ORAL | Status: DC
  Administered 2014-11-26 – 2014-11-28 (×3): 250 mg via ORAL
  Filled 2014-11-26 (×4): qty 1

## 2014-11-26 MED ORDER — VITAMIN B-12 1000 MCG PO TABS
2500.0000 ug | ORAL_TABLET | Freq: Every day | ORAL | Status: DC
  Administered 2014-11-27 – 2014-11-29 (×3): 2500 ug via ORAL
  Filled 2014-11-26 (×6): qty 1

## 2014-11-26 MED ORDER — CO Q-10 100 MG PO CAPS: 100.0000 mg | ORAL_CAPSULE | Freq: Every day | ORAL | Status: DC

## 2014-11-26 MED ORDER — ALPRAZOLAM 0.5 MG PO TABS: 1.0000 mg | ORAL_TABLET | Freq: Every evening | ORAL | Status: DC | PRN

## 2014-11-26 MED ORDER — PENTOSAN POLYSULFATE SODIUM 100 MG PO CAPS
200.0000 mg | ORAL_CAPSULE | Freq: Every day | ORAL | Status: DC
  Administered 2014-11-26 – 2014-11-29 (×4): 200 mg via ORAL
  Filled 2014-11-26 (×4): qty 2

## 2014-11-26 MED ORDER — VITAMIN D 1000 UNITS PO TABS
5000.0000 [IU] | ORAL_TABLET | Freq: Every day | ORAL | Status: DC
  Administered 2014-11-27 – 2014-11-29 (×3): 5000 [IU] via ORAL
  Filled 2014-11-26 (×3): qty 5

## 2014-11-26 MED ORDER — ALPRAZOLAM 0.25 MG PO TABS: 0.2500 mg | ORAL_TABLET | Freq: Every evening | ORAL | Status: DC | PRN

## 2014-11-26 MED ORDER — POTASSIUM CHLORIDE CRYS ER 20 MEQ PO TBCR
20.0000 meq | EXTENDED_RELEASE_TABLET | ORAL | Status: DC
  Administered 2014-11-27 – 2014-11-29 (×2): 20 meq via ORAL
  Filled 2014-11-26 (×2): qty 1

## 2014-11-26 MED ORDER — OLOPATADINE HCL 0.1 % OP SOLN
1.0000 [drp] | Freq: Two times a day (BID) | OPHTHALMIC | Status: DC
  Administered 2014-11-26 – 2014-11-29 (×6): 1 [drp] via OPHTHALMIC
  Filled 2014-11-26: qty 5

## 2014-11-26 MED ORDER — LORATADINE 10 MG PO TABS
10.0000 mg | ORAL_TABLET | Freq: Every day | ORAL | Status: DC
  Administered 2014-11-26 – 2014-11-29 (×4): 10 mg via ORAL
  Filled 2014-11-26 (×4): qty 1

## 2014-11-26 MED ORDER — HEPARIN SODIUM (PORCINE) 5000 UNIT/ML IJ SOLN
5000.0000 [IU] | Freq: Three times a day (TID) | INTRAMUSCULAR | Status: DC
  Administered 2014-11-26 – 2014-11-29 (×8): 5000 [IU] via SUBCUTANEOUS
  Filled 2014-11-26 (×11): qty 1

## 2014-11-26 MED ORDER — TRAZODONE HCL 50 MG PO TABS
50.0000 mg | ORAL_TABLET | Freq: Every evening | ORAL | Status: DC | PRN
  Filled 2014-11-26: qty 1

## 2014-11-26 MED ORDER — BECLOMETHASONE DIPROPIONATE 80 MCG/ACT IN AERS: 2.0000 | INHALATION_SPRAY | RESPIRATORY_TRACT | Status: DC

## 2014-11-26 MED ORDER — ALBUTEROL SULFATE HFA 108 (90 BASE) MCG/ACT IN AERS: 1.0000 | INHALATION_SPRAY | Freq: Four times a day (QID) | RESPIRATORY_TRACT | Status: DC | PRN

## 2014-11-26 MED ORDER — ASPIRIN EC 81 MG PO TBEC
81.0000 mg | DELAYED_RELEASE_TABLET | Freq: Every day | ORAL | Status: DC
  Administered 2014-11-26 – 2014-11-29 (×4): 81 mg via ORAL
  Filled 2014-11-26 (×4): qty 1

## 2014-11-26 MED ORDER — FUROSEMIDE 10 MG/ML IJ SOLN
40.0000 mg | Freq: Once | INTRAMUSCULAR | Status: AC
  Administered 2014-11-27: 40 mg via INTRAVENOUS
  Filled 2014-11-26: qty 4

## 2014-11-26 NOTE — Progress Notes (Signed)
Remote ICD transmission.   

## 2014-11-26 NOTE — ED Notes (Signed)
EMS - Coming from a doctors office visiting family when she was walking down the hallway and had a near syncopal episode.  Patient states she "got really dizzy".  Staff helped patient to the floor, patient did not hit her head and was assisted to a bed upon EMS arrival.  Patient was concerned over new onset of hypotension, 90/74 BP.  Patient has a hx of conversion disorder.

## 2014-11-26 NOTE — ED Notes (Signed)
Family at bedside. 

## 2014-11-26 NOTE — Progress Notes (Signed)
No ACE or ARB due to hyperkalemia when was taking previously.

## 2014-11-26 NOTE — ED Notes (Signed)
PA Hedge at bedside.

## 2014-11-26 NOTE — ED Notes (Signed)
Provided the patient and spouse with a beverage and sandwich bag.  Patient is comfortable with no complainant.

## 2014-11-26 NOTE — ED Provider Notes (Signed)
CSN: [id number]     Arrival date & time 11/26/14  1247 History   First MD Initiated Contact with Patient 11/26/14 1259     Chief Complaint  Patient presents with  . Near Syncope      Patient is a 44 y.o. female presenting with near-syncope.  Near Syncope   44 year old female with a significant past medical history of congestive heart failure presents today with near-syncope. Patient reports that she was [facility] the surgery Center today for family member when she was walking down the hall felt lightheaded. Patient reports this is not abnormal for her, but they took her blood pressure that was in the 90s, this is abnormal. Patient reports that she had a history of breast cancer was receiving chemotherapy causing congestive heart failure. She reports that since then she's had peripheral edema, intervention chest pain, occasional shortness of breath, and near syncopal episodes. She reports today's episode was similar to previous, with the addition of the low blood pressure, she also notes that she had "tingling" sensation in her chest, she states this is usually present when she has too much fluid on her. Reports that she takes her Lasix every 3 days, and adjust according to her weight. She reports she has had  An additional 5-7 pounds on her daily weigh ins. . Patient denies headache, present chest pain, increased over her shortness of breath during time of evaluation, abdominal pain, nausea, vomiting, fever, dark or bloody stools. She reports that she's been eating and drinking normal, denies diarrhea.  Pts last Echo was 06/24/14 with a left EF 20%-25%.    Past Medical History  Diagnosis Date  . 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 Cardiac MRI 05/2011 EF 32%. ICD implantation 10/2011 ([employer])  . Peripheral neuropathy     chemo- induced  . Hypertension     c/b orthostatic hypotention  .  Depression   . Palpitation     normal sinus rhythm only on 21 day heart monitor  . Obesity   . Fibromyalgia   . GERD (gastroesophageal reflux disease)   . Heart murmur   . Breast calcifications     right breast  . Interstitial cystitis   . Anxiety   . Nonischemic cardiomyopathy     related to chemo; EF 30% 05/2011  . ICD (implantable cardiac defibrillator) in place   . Pneumonia 2000's    "once"  . Type II diabetes mellitus   . Blood transfusion 1980's    1987 or 1988  . Anemia 1980's    [id number]  . History of stomach ulcers   . Breast cancer     right;  . Conversion disorder   . 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   . 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 Laparoscopic endometriosis fulguration  1990's  . Cardiac defibrillator placement  11/03/11  . Tonsillectomy  1980's  . Tubal ligation  1990's  . Vaginal hysterectomy  2000's  . Cholecystectomy  ~ 2009  . Breast lumpectomy  2008; 2013    right  . Port-a-cath removal  2010?    left chest; placed in 2008  . Nasal sinus surgery    . Implantable cardioverter defibrillator implant N/A 11/03/2011    Procedure: IMPLANTABLE  CARDIOVERTER DEFIBRILLATOR IMPLANT;  Surgeon: Deboraha Sprang, MD;  Location: [facility] CATH LAB;  Service: Cardiovascular;  Laterality: N/A;   Family History  Problem Relation Age of Onset  . Coronary artery disease    . Heart attack    . Heart disease Mother   . Heart disease Maternal Uncle   . Cancer Paternal Uncle     colon  . Heart disease Maternal Grandmother   . Cancer Paternal Grandmother     ovarian   History  Substance Use Topics  . Smoking status: Never Smoker   . Smokeless tobacco: Never Used  . Alcohol Use: Yes     Comment: 11/03/11 "maybe once a month"   OB History    No data available     Review  of Systems  Cardiovascular: Positive for near-syncope.   See HPI all other systems neg  Allergies  Other and Reglan  Home Medications   Prior to Admission medications   Medication Sig Start Date End Date Taking? Authorizing Provider  albuterol (PROAIR HFA) 108 (90 BASE) MCG/ACT inhaler Inhale 1-2 puffs into the lungs every 6 (six) hours as needed for wheezing or shortness of breath. Pt needs appt for more refills    Chesley Mires, MD  ALPRAZolam Duanne Moron) 1 MG tablet Take 1 tablet (1 mg total) by mouth at bedtime. 10/14/14   Golden Circle, FNP  amoxicillin-clavulanate (AUGMENTIN) 875-125 MG per tablet Take 1 tablet by mouth 2 (two) times daily. 11/12/14   Laurie Panda, NP  B Complex-C (B-COMPLEX WITH VITAMIN C) tablet Take 1 tablet by mouth daily.    Historical Provider, MD  baclofen (LIORESAL) 10 MG tablet TAKE 1 TABLET BY MOUTH 2 (TWO) TIMES DAILY AS NEEDED FOR MUSCLE SPASMS. 11/12/14   Golden Circle, FNP  carvedilol (COREG) 25 MG tablet TAKE 1 TABLET BY MOUTH TWICE A DAY WITH A MEAL. MUST HAVE FOLLOW UP APPOINTMENT FOR FURTHER REFILLS 11/12/14   Jolaine Artist, MD  Cholecalciferol (VITAMIN D3) 5000 UNITS CAPS Take 1 capsule by mouth daily.     Historical Provider, MD  Coenzyme Q10 (CO Q-10) 100 MG CAPS Take 100 mg by mouth daily.    Historical Provider, MD  Cyanocobalamin 2500 MCG SUBL Place 2,500 mcg under the tongue daily.    Historical Provider, MD  digoxin (LANOXIN) 0.125 MG tablet Take 0.5 tablets (0.0625 mg total) by mouth daily. 05/20/14   Jolaine Artist, MD  divalproex (DEPAKOTE ER) 250 MG 24 hr tablet TAKE 1 TABLET (250 MG TOTAL) BY MOUTH DAILY. 12/19/13   Asencion Partridge Dohmeier, MD  fluconazole (DIFLUCAN) 150 MG tablet Take 1 tablet (150 mg total) by mouth daily. Take 1 tablet today and repeat dose in 3 days. 11/12/14   Laurie Panda, NP  GLUCOSAMINE-CHONDROITIN PO Take 1,200 mg by mouth daily.    Historical Provider, MD  loratadine (CLARITIN) 10 MG tablet Take 10 mg by mouth  daily.    Historical Provider, MD  metolazone (ZAROXOLYN) 2.5 MG tablet TAKE 1 TABLET (2.5 MG TOTAL) BY MOUTH AS DIRECTED. 04/26/14   Shaune Pascal Bensimhon, MD  montelukast (SINGULAIR) 10 MG tablet TAKE 1 TABLET (10 MG TOTAL) BY MOUTH AT BEDTIME. 10/25/14   Chesley Mires, MD  Multiple Vitamin (MULTIVITAMIN WITH MINERALS) TABS Take 1 tablet by mouth daily.    Historical Provider, MD  nystatin (MYCOSTATIN) 100000 UNIT/ML suspension Take 5 mLs (500,000 Units total) by mouth 4 (four) times daily. Patient not taking: Reported on 10/24/2014 07/08/14  Golden Circle, FNP  omeprazole (PRILOSEC) 20 MG capsule Take 1 capsule (20 mg total) by mouth daily. 07/08/14   Golden Circle, FNP  PATADAY 0.2 % SOLN  01/22/13   Historical Provider, MD  pentosan polysulfate (ELMIRON) 100 MG capsule Take 1 capsule (100 mg total) by mouth 2 (two) times daily. Patient not taking: Reported on 10/24/2014 07/08/14   Golden Circle, FNP  potassium chloride SA (K-DUR,KLOR-CON) 20 MEQ tablet Take 1 tablet (20 mEq total) by mouth 3 (three) times a week. Every Mon, Wed and Fri with torsemide 07/18/14   Jolaine Artist, MD  PRODIGY NO CODING BLOOD GLUC test strip  01/24/13   Historical Provider, MD  spironolactone (ALDACTONE) 25 MG tablet Take 0.5 tablets (12.5 mg total) by mouth daily. 05/17/14   Amy D Ninfa Meeker, NP  torsemide (DEMADEX) 20 MG tablet Take 1 tablet (20 mg total) by mouth 3 (three) times a week. Every Mon, Wed and Fri 07/18/14   Shaune Pascal Bensimhon, MD   BP 103/68 mmHg  Pulse 79  Temp(Src) 97.5 F (36.4 C) (Oral)  Resp 18  Ht 5\' 7"  (1.702 m)  Wt 253 lb 12.8 oz (115.123 kg)  BMI 39.74 kg/m2  SpO2 98% Physical Exam  Constitutional: She is oriented to person, place, and time. She appears well-developed and well-nourished.  HENT:  Head: Normocephalic and atraumatic.  Eyes: Conjunctivae are normal. Pupils are equal, round, and reactive to light. Right eye exhibits no discharge. Left eye exhibits no discharge. No scleral  icterus.  Neck: Normal range of motion. Neck supple. No JVD present. No tracheal deviation present.  Cardiovascular: Normal rate and regular rhythm.   Murmur heard. Pulmonary/Chest: Effort normal and breath sounds normal. No stridor.  Abdominal: Soft. There is no tenderness.  Musculoskeletal: Normal range of motion. She exhibits edema.  Neurological: She is alert and oriented to person, place, and time. Coordination normal.  Skin: Skin is warm and dry.  Psychiatric: She has a normal mood and affect. Her behavior is normal. Judgment and thought content normal.  Nursing note and vitals reviewed.   ED Course  Procedures (including critical care time) Labs Review Labs Reviewed  CBC  BASIC METABOLIC PANEL  PREGNANCY, URINE  BRAIN NATRIURETIC PEPTIDE  CBG MONITORING, ED  I-STAT CHEM 8, ED  POC URINE PREG, ED    Imaging Review No results found.   EKG Interpretation   Date/Time:  Tuesday November 26 2014 13:32:30 EDT Ventricular Rate:  78 PR Interval:  193 QRS Duration: 98 QT Interval:  422 QTC Calculation: 481 R Axis:   -28 Text Interpretation:  Sinus rhythm Biatrial enlargement Borderline left  axis deviation Nonspecific T abnormalities, lateral leads no significant  change since 2013 Confirmed by GOLDSTON  MD, SCOTT ([id number]) on 11/26/2014  1:47:25 PM      MDM   Final diagnoses:  Near syncope    Labs: Point of care urine pregnant, i-STAT troponin, CBC, BMP, BNP, CBG- significant for creatinine 1.42, BNP of 1093.   Imaging: DG chest 2 view stable cardiomegaly no acute cardiopulmonary abnormality  Consults: Cardiology  Therapeutics: Normal saline 500 mL  Assessment: Acute Kidney Injury,   Plan: Patient presents with a near-syncopal episode, elevated creatinine, elevated BNP. Symptoms normal per patient, with the exception of the hypotension. Shortly after arriving in the ED blood pressures reached into the 100 and maintained there throughout her stay. She had no  significant complaints while here. Pacemaker was interrogated with no significant findings. Due to  patient's significant past medical history, near syncope, elevated BNP, and intermittent shortness of breath cardiology was consult to further evaluation. Pt signed out to [name] MD pending cardiologist consult and likely hospital admission.      Okey Regal, PA-C 11/29/14 [id number]  Sherwood Gambler, MD 11/30/14 [phone]

## 2014-11-26 NOTE — ED Notes (Signed)
PA Hedge at bedside.  Phlebotomy at bedside.

## 2014-11-26 NOTE — ED Notes (Signed)
Pt is aware that urine is needed for testing, pt is unable to urinate at this time.

## 2014-11-26 NOTE — H&P (Addendum)
[name] is an 44 y.o. female.    Primary Cardiologist: Dr. Haroldine Laws EP: Dr. Caryl Comes PCP: Mauricio Po, FNP  Chief Complaint: near syncope- BP low at 90 systolic HPI:   44 year old female with a significant past medical history of congestive heart failure- with low EF and ICD presents today with near-syncope. Patient reports that she was at the surgery Center today for family member when she was walking down the hall felt lightheaded. Patient reports this is not abnormal for her, but they took her blood pressure that was in the 90s, this is abnormal. Patient reports that she had a history of breast cancer was receiving chemotherapy causing congestive heart failure. She reports that since then she's had peripheral edema, intervention chest pain, occasional shortness of breath, and near syncopal episodes. She reports today's episode was similar to previous, with the addition of the low blood pressure, she also notes that she had "tingling" sensation in her chest, she states this is usually present when she has too much fluid on her. Reports that she takes her torsemide every 3 days, and adjust according to her weight. She reports she has had An additional 5-7 pounds on her daily weigh . She took her torsemide on Sunday as well as Monday due to volume overload and SOB.   Patient denies headache, + chest pain mid sternal to rt radiation. She has increased shortness of breath recently as well.  She has abdominal pain, nausea, no  vomiting, fever, dark or bloody stools. She reports that she's been eating and drinking normal, denies diarrhea. Pts last Echo was 06/24/14 with a left EF 20%-25%. Thought to be chemotherapy induced.  Also hx of conversion disorder.    She had ICD implanted 10/2011 with non ischemic cardiomyopathy. Last seen by Dr. Caryl Comes 12/15 and Dr. Haroldine Laws she was stable and euvolemic.    Past Medical History  Diagnosis Date  . 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 Cardiac MRI 05/2011 EF 32%. ICD implantation 10/2011 ([employer])  . Peripheral neuropathy     chemo- induced  . Hypertension     c/b orthostatic hypotention  . Depression   . Palpitation     normal sinus rhythm only on 21 day heart monitor  . Obesity   . Fibromyalgia   . GERD (gastroesophageal reflux disease)   . Heart murmur   . Breast calcifications     right breast  . Interstitial cystitis   . Anxiety   . Nonischemic cardiomyopathy     related to chemo; EF 30% 05/2011  . ICD (implantable cardiac defibrillator) in place   . Pneumonia 2000's    "once"  . Type II diabetes mellitus   . Blood transfusion 1980's    1987 or 1988  . Anemia 1980's    [id number]  . History of stomach ulcers   . Breast cancer     right;  . Conversion disorder   . 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   . 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 Laparoscopic endometriosis fulguration  1990's  . Cardiac defibrillator placement  11/03/11  . Tonsillectomy  1980's  . Tubal ligation  1990's  . Vaginal hysterectomy  2000's  .  Cholecystectomy  ~ 2009  . Breast lumpectomy  2008; 2013    right  . Port-a-cath removal  2010?    left chest; placed in 2008  . Nasal sinus surgery    . Implantable cardioverter defibrillator implant N/A 11/03/2011    Procedure: IMPLANTABLE CARDIOVERTER DEFIBRILLATOR IMPLANT;  Surgeon: Deboraha Sprang, MD; MDT     Family History  Problem Relation Age of Onset  . Coronary artery disease    . Heart attack    . Heart disease Mother   . Heart disease Maternal Uncle   . Cancer Paternal Uncle     colon  . Heart disease Maternal Grandmother   . Cancer Paternal Grandmother     ovarian   Social  History:  reports that she has never smoked. She has never used smokeless tobacco. She reports that she drinks alcohol. She reports that she does not use illicit drugs.  Allergies:  Allergies  Allergen Reactions  . Reglan [Metoclopramide Hcl] Anxiety  . Adhesive [Tape] Itching and Rash  . Other Itching and Rash    Chloraprep  . Chlorhexidine Itching and Rash    OUTPATIENT  Medications:   No current facility-administered medications on file prior to encounter.   Current Outpatient Prescriptions on File Prior to Encounter  Medication Sig Dispense Refill  . albuterol (PROAIR HFA) 108 (90 BASE) MCG/ACT inhaler Inhale 1-2 puffs into the lungs every 6 (six) hours as needed for wheezing or shortness of breath. Pt needs appt for more refills 8.5 each 0  . ALPRAZolam (XANAX) 1 MG tablet Take 1 tablet (1 mg total) by mouth at bedtime. (Patient taking differently: Take 1 mg by mouth at bedtime as needed for anxiety or sleep. ) 30 tablet 0  . B Complex-C (B-COMPLEX WITH VITAMIN C) tablet Take 1 tablet by mouth daily.    . baclofen (LIORESAL) 10 MG tablet TAKE 1 TABLET BY MOUTH 2 (TWO) TIMES DAILY AS NEEDED FOR MUSCLE SPASMS. 60 tablet 0  . carvedilol (COREG) 25 MG tablet TAKE 1 TABLET BY MOUTH TWICE A DAY WITH A MEAL. MUST HAVE FOLLOW UP APPOINTMENT FOR FURTHER REFILLS 60 tablet 6  . Cholecalciferol (VITAMIN D3) 5000 UNITS CAPS Take 1 capsule by mouth daily.     . Coenzyme Q10 (CO Q-10) 100 MG CAPS Take 100 mg by mouth daily.    . Cyanocobalamin 2500 MCG SUBL Place 2,500 mcg under the tongue daily.    . digoxin (LANOXIN) 0.125 MG tablet Take 0.5 tablets (0.0625 mg total) by mouth daily. 30 tablet 6  . divalproex (DEPAKOTE ER) 250 MG 24 hr tablet TAKE 1 TABLET (250 MG TOTAL) BY MOUTH DAILY. (Patient taking differently: TAKE 1 TABLET (250 MG TOTAL) BY MOUTH DAILY AT BEDTIME) 90 tablet 2  . GLUCOSAMINE-CHONDROITIN PO Take 1,200 mg by mouth daily.    Marland Kitchen loratadine (CLARITIN) 10 MG tablet Take 10 mg by  mouth daily.    . metolazone (ZAROXOLYN) 2.5 MG tablet TAKE 1 TABLET (2.5 MG TOTAL) BY MOUTH AS DIRECTED. (Patient taking differently: TAKE 1 TABLET (2.5 MG TOTAL) BY MOUTH AS NEEDED FOR FLUID AND EDEMA) 5 tablet 1  . montelukast (SINGULAIR) 10 MG tablet TAKE 1 TABLET (10 MG TOTAL) BY MOUTH AT BEDTIME. 30 tablet 0  . Multiple Vitamin (MULTIVITAMIN WITH MINERALS) TABS Take 1 tablet by mouth daily.    Marland Kitchen nystatin (MYCOSTATIN) 100000 UNIT/ML suspension Take 5 mLs (500,000 Units total) by mouth 4 (four) times daily. 60 mL 0  . omeprazole (PRILOSEC)  20 MG capsule Take 1 capsule (20 mg total) by mouth daily. 30 capsule 5  . PATADAY 0.2 % SOLN Place 1 drop into both eyes daily.     . pentosan polysulfate (ELMIRON) 100 MG capsule Take 1 capsule (100 mg total) by mouth 2 (two) times daily. (Patient taking differently: Take 200 mg by mouth daily. ) 60 capsule 3  . potassium chloride SA (K-DUR,KLOR-CON) 20 MEQ tablet Take 1 tablet (20 mEq total) by mouth 3 (three) times a week. Every Mon, Wed and Fri with torsemide    . spironolactone (ALDACTONE) 25 MG tablet Take 0.5 tablets (12.5 mg total) by mouth daily. 15 tablet 6  . torsemide (DEMADEX) 20 MG tablet Take 1 tablet (20 mg total) by mouth 3 (three) times a week. Every Mon, Wed and Fri    . amoxicillin-clavulanate (AUGMENTIN) 875-125 MG per tablet Take 1 tablet by mouth 2 (two) times daily. (Patient not taking: Reported on 11/26/2014) 14 tablet 0  . fluconazole (DIFLUCAN) 150 MG tablet Take 1 tablet (150 mg total) by mouth daily. Take 1 tablet today and repeat dose in 3 days. 2 tablet 0    Results for orders placed or performed during the hospital encounter of 11/26/14 (from the past 48 hour(s))  CBG, ED     Status: None   Collection Time: 11/26/14  1:12 PM  Result Value Ref Range   Glucose-Capillary 81 65 - 99 mg/dL  CBC     Status: Abnormal   Collection Time: 11/26/14  1:33 PM  Result Value Ref Range   WBC 5.4 4.0 - 10.5 K/uL   RBC 4.52 3.87 - 5.11  MIL/uL   Hemoglobin 13.6 12.0 - 15.0 g/dL   HCT 40.7 36.0 - 46.0 %   MCV 90.0 78.0 - 100.0 fL   MCH 30.1 26.0 - 34.0 pg   MCHC 33.4 30.0 - 36.0 g/dL   RDW 15.8 (H) 11.5 - 15.5 %   Platelets 162 150 - 400 K/uL  Basic metabolic panel     Status: Abnormal   Collection Time: 11/26/14  1:33 PM  Result Value Ref Range   Sodium 137 135 - 145 mmol/L   Potassium 4.6 3.5 - 5.1 mmol/L   Chloride 101 101 - 111 mmol/L   CO2 26 22 - 32 mmol/L   Glucose, Bld 87 65 - 99 mg/dL   BUN 21 (H) 6 - 20 mg/dL   Creatinine, Ser 1.42 (H) 0.44 - 1.00 mg/dL   Calcium 8.6 (L) 8.9 - 10.3 mg/dL   GFR calc non Af Amer 44 (L) >60 mL/min   GFR calc Af Amer 51 (L) >60 mL/min    Comment: (NOTE) The eGFR has been calculated using the CKD EPI equation. This calculation has not been validated in all clinical situations. eGFR's persistently <60 mL/min signify possible Chronic Kidney Disease.    Anion gap 10 5 - 15  Brain natriuretic peptide     Status: Abnormal   Collection Time: 11/26/14  1:33 PM  Result Value Ref Range   B Natriuretic Peptide 1093.0 (H) 0.0 - 100.0 pg/mL  I-stat troponin, ED     Status: None   Collection Time: 11/26/14  1:40 PM  Result Value Ref Range   Troponin i, poc 0.02 0.00 - 0.08 ng/mL   Comment 3            Comment: Due to the release kinetics of cTnI, a negative result within the first hours of the onset of symptoms does  not rule out myocardial infarction with certainty. If myocardial infarction is still suspected, repeat the test at appropriate intervals.   Pregnancy, urine (if patient is pre-menopausal female)     Status: None   Collection Time: 11/26/14  1:58 PM  Result Value Ref Range   Preg Test, Ur NEGATIVE NEGATIVE    Comment:        THE SENSITIVITY OF THIS METHODOLOGY IS >20 mIU/mL.   POC Urine Preg, ED (if patient is pre-menopausal female)  not at [facility]     Status: None   Collection Time: 11/26/14  2:17 PM  Result Value Ref Range   Preg Test, Ur NEGATIVE NEGATIVE     Comment:        THE SENSITIVITY OF THIS METHODOLOGY IS >24 mIU/mL    Dg Chest 2 View  11/26/2014   CLINICAL DATA:  43 year old female with shortness of breath and chest pain for 4 days. Near syncope today. Initial encounter.  EXAM: CHEST  2 VIEW  COMPARISON:  01/03/2014 and earlier.  FINDINGS: Stable left chest cardiac AICD. Chronic moderate to severe cardiomegaly. Mediastinal contours are stable. Interval regressed pulmonary vascular congestion. No pneumothorax, pulmonary edema, pleural effusion or consolidation. Stable postoperative changes to the right axilla and chest wall. Visualized tracheal air column is within normal limits. No acute osseous abnormality identified.  IMPRESSION: Stable cardiomegaly. No acute cardiopulmonary abnormality.   Electronically Signed   By: Genevie Ann M.D.   On: 11/26/2014 15:18    KJZ:PHXTAVW:PV colds or fevers,  weight up 5-6 pounds from her usual and she has had problems getting rid of fluid  Skin:no rashes or ulcers HEENT:no blurred vision, no congestion CV:see HPI PUL:see HPI GI:no diarrhea + constipation or melena- though occ red streaks OF BLOOD IN STOOL. + indigestion. + mucus that comes up from stomach per ENT that she has seen  GU:no hematuria, no dysuria MS:no joint pain, no claudication Neuro:no syncope, + lightheadedness, with conversion disorder she does have episodes where she cannot answer but hears what is going on around her.   Endo:+ diabetes, no thyroid disease    Blood pressure 104/68, pulse 73, temperature 97.5 F (36.4 C), temperature source Oral, resp. rate 21, height $RemoveBe'5\' 7"'[id number]$  (1.702 m), weight 253 lb 12.8 oz (115.123 kg), SpO2 100 %. PE: General:Pleasant affect, NAD Skin:Warm and dry, brisk capillary refill HEENT:normocephalic, sclera clear, mucus membranes moist Neck:supple, no JVD, no bruits, no adenopathy   Heart:S1S2 RRR without murmur, gallup, rub or click Lungs:clear to diminished in the bases,  without rales, rhonchi, or  wheezes XYI:AXKPV, soft, diffuse tenderness, + BS, do not palpate liver spleen or masses Ext:1-2+ lower ext edema, 2+ pedal pulses, 2+ radial pulses Neuro:alert and oriented X 3, MAE, follows commands, + facial symmetry    Assessment/Plan Principal Problem:   Near syncope- ICD was checked but I have not seen results.    Active Problems:   Chest pain- more than usual.   SYSTOLIC HEART FAILURE, CHRONIC with some volume overload, will give IV lasix tonight and have her use BSC monitor BP   Conversion disorder- treated   Stomach discomfort- to see GI continue prilosec    [facility] R Nurse Practitioner Certified [place] Pager [phone] or after 5pm or weekends call [phone] 11/26/2014, 6:34 PM   Patient seen and examined. Agree with assessment and plan. Chart reviewed. 44 yo AAF with h/o chemotherapy induced cardiomyopathy. She admits to 7 lb weight over the past 2 days and less  diuresis following her torsemide oral dose. She has noticed mild sob, no chest pain. BNP 1093. Trop neg. Pt did eat out this weekend on her anniversary. She experienced presyncope without syncope. No orthostatic BPdrop on exam in ER.  Will give iv lasix 40 mg now and in am. She had developed cramps this am and took oral KCl. She recently was told of having increase in bili. Lab 1.62 in 09/2014. Will recheck LFT's.   Troy Sine, MD, [facility] 11/26/2014 6:41 PM

## 2014-11-26 NOTE — Progress Notes (Signed)
PHARMACIST - PHYSICIAN ORDER COMMUNICATION  CONCERNING: P&T Medication Policy on Herbal Medication  DESCRIPTION:  This patient's order for:  Coenzyme Q 10  has been noted.  This product(s) is classified as an "herbal" or natural product. Due to a lack of definitive safety studies or FDA approval, nonstandard manufacturing practices, plus the potential risk of unknown drug-drug interactions while on inpatient medications, the Pharmacy and Therapeutics Committee does not permit the use of "herbal" or natural products of this type within [facility].   ACTION TAKEN: The pharmacy department is unable to verify this order at this time and your patient has been informed of this safety policy. Please reevaluate patient's clinical condition at discharge and address if the herbal or natural product(s) should be resumed at that time.  Sherlon Handing, PharmD, BCPS Clinical pharmacist, pager [phone] 11/26/2014 11:00 PM

## 2014-11-27 ENCOUNTER — Encounter: Payer: Self-pay | Admitting: Gastroenterology

## 2014-11-27 ENCOUNTER — Encounter (HOSPITAL_COMMUNITY): Payer: Self-pay | Admitting: Cardiology

## 2014-11-27 DIAGNOSIS — Z95810 Presence of automatic (implantable) cardiac defibrillator: Secondary | ICD-10-CM | POA: Diagnosis present

## 2014-11-27 DIAGNOSIS — I5023 Acute on chronic systolic (congestive) heart failure: Secondary | ICD-10-CM | POA: Diagnosis present

## 2014-11-27 LAB — GLUCOSE, CAPILLARY
GLUCOSE-CAPILLARY: 102 mg/dL — AB (ref 65–99)
Glucose-Capillary: 99 mg/dL (ref 65–99)
Glucose-Capillary: 106 mg/dL — ABNORMAL HIGH (ref 65–99)
Glucose-Capillary: 99 mg/dL (ref 65–99)

## 2014-11-27 LAB — CREATININE, SERUM
Creatinine, Ser: 1.25 mg/dL — ABNORMAL HIGH (ref 0.44–1.00)
GFR calc Af Amer: 60 mL/min — ABNORMAL LOW (ref >60)
GFR calc non Af Amer: 52 mL/min — ABNORMAL LOW (ref >60)

## 2014-11-27 LAB — COMPREHENSIVE METABOLIC PANEL
ALT: 25 U/L (ref 14–54)
AST: 31 U/L (ref 15–41)
Albumin: 3.2 g/dL — ABNORMAL LOW (ref 3.5–5.0)
Alkaline Phosphatase: 77 U/L (ref 38–126)
Anion gap: 9 (ref 5–15)
BUN: 17 mg/dL (ref 6–20)
CO2: 29 mmol/L (ref 22–32)
Calcium: 8.9 mg/dL (ref 8.9–10.3)
Chloride: 99 mmol/L — ABNORMAL LOW (ref 101–111)
Creatinine, Ser: 1.17 mg/dL — ABNORMAL HIGH (ref 0.44–1.00)
GFR calc Af Amer: >60 mL/min (ref >60)
GFR, EST NON AFRICAN AMERICAN: 56 mL/min — AB (ref >60)
GLUCOSE: 120 mg/dL — AB (ref 65–99)
POTASSIUM: 4.1 mmol/L (ref 3.5–5.1)
Sodium: 137 mmol/L (ref 135–145)
Total Bilirubin: 2.7 mg/dL — ABNORMAL HIGH (ref 0.3–1.2)
Total Protein: 7.1 g/dL (ref 6.5–8.1)

## 2014-11-27 LAB — TSH: TSH: 2.678 u[IU]/mL (ref 0.350–4.500)

## 2014-11-27 LAB — DIGOXIN LEVEL: DIGOXIN LVL: 0.3 ng/mL — AB (ref 0.8–2.0)

## 2014-11-27 LAB — MRSA PCR SCREENING: MRSA BY PCR: NEGATIVE

## 2014-11-27 LAB — TROPONIN I
Troponin I: 0.03 ng/mL (ref <0.031)
Troponin I: <0.03 ng/mL (ref <0.031)

## 2014-11-27 LAB — BRAIN NATRIURETIC PEPTIDE: B Natriuretic Peptide: 665.6 pg/mL — ABNORMAL HIGH (ref 0.0–100.0)

## 2014-11-27 LAB — MAGNESIUM: MAGNESIUM: 1.8 mg/dL (ref 1.7–2.4)

## 2014-11-27 MED ORDER — CARVEDILOL 25 MG PO TABS: 12.5000 mg | ORAL_TABLET | Freq: Two times a day (BID) | ORAL | Status: AC

## 2014-11-27 MED ORDER — HYDROXYZINE HCL 25 MG PO TABS
25.0000 mg | ORAL_TABLET | Freq: Every day | ORAL | Status: DC
  Administered 2014-11-27 – 2014-11-28 (×3): 25 mg via ORAL
  Filled 2014-11-27 (×4): qty 1

## 2014-11-27 MED ORDER — CARVEDILOL 12.5 MG PO TABS
12.5000 mg | ORAL_TABLET | Freq: Two times a day (BID) | ORAL | Status: DC
  Administered 2014-11-27 – 2014-11-29 (×2): 12.5 mg via ORAL
  Filled 2014-11-27 (×7): qty 1

## 2014-11-27 MED ORDER — ACETAMINOPHEN 325 MG PO TABS: 650.0000 mg | ORAL_TABLET | ORAL | Status: DC | PRN

## 2014-11-27 MED ORDER — SPIRONOLACTONE 25 MG PO TABS: 12.5000 mg | ORAL_TABLET | Freq: Every day | ORAL | Status: DC

## 2014-11-27 MED ORDER — FUROSEMIDE 10 MG/ML IJ SOLN
40.0000 mg | Freq: Two times a day (BID) | INTRAMUSCULAR | Status: DC
  Administered 2014-11-27 – 2014-11-28 (×2): 40 mg via INTRAVENOUS
  Filled 2014-11-27 (×3): qty 4

## 2014-11-27 MED ORDER — CARVEDILOL 12.5 MG PO TABS: 12.5000 mg | ORAL_TABLET | Freq: Two times a day (BID) | ORAL | Status: DC

## 2014-11-27 MED ORDER — INSULIN ASPART 100 UNIT/ML ~~LOC~~ SOLN: 0.0000 [IU] | Freq: Three times a day (TID) | SUBCUTANEOUS | Status: DC

## 2014-11-27 NOTE — Progress Notes (Signed)
Subjective:  Overall she feels better, less SOB, up to bedside commode without problem.   Objective:  Vital Signs in the last 24 hours: Temp:  [97.3 F (36.3 C)-97.7 F (36.5 C)] 97.3 F (36.3 C) (06/15 1227) Pulse Rate:  [49-83] 70 (06/15 0650) Resp:  [11-24] 21 (06/15 0830) BP: (92-119)/(46-89) 108/72 mmHg (06/15 0830) SpO2:  [95 %-100 %] 100 % (06/15 0830) Weight:  [249 lb 12.5 oz (113.3 kg)] 249 lb 12.5 oz (113.3 kg) (06/15 0500)  Intake/Output from previous day:  Intake/Output Summary (Last 24 hours) at 11/27/14 1310 Last data filed at 11/27/14 0500  Gross per 24 hour  Intake    960 ml  Output   1475 ml  Net   -515 ml    Physical Exam: General appearance: alert, cooperative, no distress and moderately obese Neck: no carotid bruit. JVP hard to see  Lungs: clear to auscultation bilaterally Heart: regular rate and rhythm and soft systolic murmur LSB Extremities: 1-2+ edema   Rate: 78  Rhythm: normal sinus rhythm and premature ventricular contractions (PVC)  Lab Results:  Recent Labs  11/26/14 1333 11/26/14 2243  WBC 5.4 5.5  HGB 13.6 13.8  PLT 162 152    Recent Labs  11/26/14 1333 11/26/14 2243 11/27/14 0330  NA 137  --  137  K 4.6  --  4.1  CL 101  --  99*  CO2 26  --  29  GLUCOSE 87  --  120*  BUN 21*  --  17  CREATININE 1.42* 1.25* 1.17*    Recent Labs  11/26/14 2243 11/27/14 0330  TROPONINI 0.03 <0.03    Recent Labs  11/26/14 2243  INR 1.36    Scheduled Meds: . aspirin EC  81 mg Oral Daily  . B-complex with vitamin C  1 tablet Oral Daily  . budesonide (PULMICORT) nebulizer solution  0.25 mg Nebulization BID  . carvedilol  25 mg Oral BID WC  . cholecalciferol  5,000 Units Oral Daily  . vitamin B-12  2,500 mcg Oral Daily  . digoxin  0.0625 mg Oral Daily  . divalproex  250 mg Oral QHS  . heparin  5,000 Units Subcutaneous 3 times per day  . hydrOXYzine  25 mg Oral QHS  . insulin aspart  0-15 Units Subcutaneous TID WC  .  loratadine  10 mg Oral Daily  . montelukast  10 mg Oral QHS  . multivitamin with minerals  1 tablet Oral Daily  . olopatadine  1 drop Both Eyes BID  . pantoprazole  80 mg Oral Daily  . pentosan polysulfate  200 mg Oral Daily  . potassium chloride SA  20 mEq Oral Once per day on Mon Wed Fri  . sodium chloride  3 mL Intravenous Q12H  . spironolactone  12.5 mg Oral Daily   Continuous Infusions:  PRN Meds:.sodium chloride, acetaminophen, albuterol, ALPRAZolam, baclofen, ondansetron (ZOFRAN) IV, sodium chloride, traMADol, traZODone   Imaging: Dg Chest 2 View  11/26/2014   CLINICAL DATA:  44 year old female with shortness of breath and chest pain for 4 days. Near syncope today. Initial encounter.  EXAM: CHEST  2 VIEW  COMPARISON:  01/03/2014 and earlier.  FINDINGS: Stable left chest cardiac AICD. Chronic moderate to severe cardiomegaly. Mediastinal contours are stable. Interval regressed pulmonary vascular congestion. No pneumothorax, pulmonary edema, pleural effusion or consolidation. Stable postoperative changes to the right axilla and chest wall. Visualized tracheal air column is within normal limits. No acute osseous abnormality identified.  IMPRESSION:  Stable cardiomegaly. No acute cardiopulmonary abnormality.   Electronically Signed   By: Genevie Ann M.D.   On: 11/26/2014 15:18    Cardiac Studies: Echo Jan 2016 Study Conclusions  - Left ventricle: The cavity size was moderately dilated. Wall thickness was normal. Systolic function was severely reduced. The estimated ejection fraction was in the range of 20% to 25%. Diffuse hypokinesis. Features are consistent with a pseudonormal left ventricular filling pattern, with concomitant abnormal relaxation and increased filling pressure (grade 2 diastolic dysfunction). - Aortic valve: There was no stenosis. There was trivial regurgitation. - Mitral valve: There was trivial regurgitation. - Left atrium: The atrium was mildly to  moderately dilated. - Right ventricle: The cavity size was mildly dilated. Pacer wire or catheter noted in right ventricle. Systolic function was moderately reduced. - Right atrium: The atrium was mildly dilated. - Tricuspid valve: Peak RV-RA gradient (S): 44 mm Hg. - Pulmonary arteries: PA peak pressure: 52 mm Hg (S). - Systemic veins: IVC measured 2.2 cm with > 50% respirophasic variation, suggesting RA pressure 8 mmHg.  Impressions:  - Moderately dilated LV with severely reduced systolic function, EF 35-57%. Diffuse hypokinesis. Moderate diastolic dysfunction. Mildly dilated RV with moderate systolic dysfunction. Biatrial enlargement. Mild pulmonary hypertension.   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 She is s/p MDT ICD 2013. She presented 11/26/14 with orthostatic near syncope and acute on chronic systolic CHF 09/02/00. She diuresed 0.5 L so far.   Principal Problem:   Near syncope Active Problems:   Secondary cardiomyopathy s/p chemo therapy   Orthostatic hypotension   Acute on chronic systolic CHF (congestive heart failure)   Conversion disorder   Generalized anxiety disorder   ICD in place (MDT 2013)   Obesity-BMI 38   Essential hypertension   Breast cancer, right breast   Stomach discomfort   PLAN: Ambulate, check orthostatic B/P, she might need another day of IV lasix. We may need to decrease Coreg- will review home diuretics with MD.    Kerin Ransom PA-C 11/27/2014, 1:10 PM [phone]  Note: she is orthostatic- B/P 283 systolic to 84 systolic standing  Kerin Ransom, PA-C  Patient seen and examined with Kerin Ransom, PA-C. We discussed all aspects of the encounter. I agree with the assessment and plan as stated above.   She h/o chronic "drop attacks" felt reltaed to her convergence disorder and this  seems typical for one of those attacks. She has never had injury from these. She has been found to have mild orthostasis.   ICD interrogation done. Volume status mildly elevated (on exam too). No VT/AF.   Will cut carvedilol back to 12.5 bid. Diurese gently with IV lasix. Place TED hose. May need low-dose midodrine.  Can go to tele. Home in am.   Bensimhon, Daniel,MD 3:43 PM

## 2014-11-27 NOTE — ED Notes (Cosign Needed)
Patient care assumed from [facility], PA-C at 1600. Please see his note for detailed history of present illness and exam. Agree with the above. Briefly patient is a 44 year old with a history of radiation-induced congestive heart failure who presents to the ED with near syncope. There is evidence of volume overload and CHF exacerbation. Cardiology has evaluated the patient before currently awaiting their recommendations. Plan to follow up cardiology recommendations.  Cardiology will admit for further management. Patient remained hemodynamically stable without any acute incidents during my care. She was admitted to cardiology in good condition.  Patient seen in conjunction with Dr. Audie Pinto.  [name], M.D. Resident  [name], MD 11/27/14 [phone]

## 2014-11-27 NOTE — Progress Notes (Signed)
UR COMPLETED  

## 2014-11-27 NOTE — Discharge Instructions (Signed)

## 2014-11-28 DIAGNOSIS — N289 Disorder of kidney and ureter, unspecified: Secondary | ICD-10-CM

## 2014-11-28 LAB — BASIC METABOLIC PANEL
ANION GAP: 9 (ref 5–15)
Anion gap: 12 (ref 5–15)
BUN: 16 mg/dL (ref 6–20)
BUN: 15 mg/dL (ref 6–20)
CALCIUM: 8.3 mg/dL — AB (ref 8.9–10.3)
CALCIUM: 8.9 mg/dL (ref 8.9–10.3)
CO2: 22 mmol/L (ref 22–32)
CO2: 27 mmol/L (ref 22–32)
Chloride: 95 mmol/L — ABNORMAL LOW (ref 101–111)
Chloride: 99 mmol/L — ABNORMAL LOW (ref 101–111)
Creatinine, Ser: 1.05 mg/dL — ABNORMAL HIGH (ref 0.44–1.00)
Creatinine, Ser: 1.1 mg/dL — ABNORMAL HIGH (ref 0.44–1.00)
GFR calc Af Amer: >60 mL/min (ref >60)
GFR calc non Af Amer: >60 mL/min (ref >60)
GLUCOSE: 106 mg/dL — AB (ref 65–99)
Glucose, Bld: 165 mg/dL — ABNORMAL HIGH (ref 65–99)
Potassium: 4.4 mmol/L (ref 3.5–5.1)
SODIUM: 129 mmol/L — AB (ref 135–145)
Sodium: 135 mmol/L (ref 135–145)

## 2014-11-28 LAB — GLUCOSE, CAPILLARY
Glucose-Capillary: 101 mg/dL — ABNORMAL HIGH (ref 65–99)
Glucose-Capillary: 223 mg/dL — ABNORMAL HIGH (ref 65–99)
Glucose-Capillary: 135 mg/dL — ABNORMAL HIGH (ref 65–99)
Glucose-Capillary: 116 mg/dL — ABNORMAL HIGH (ref 65–99)
Glucose-Capillary: 117 mg/dL — ABNORMAL HIGH (ref 65–99)

## 2014-11-28 LAB — EXPECTORATED SPUTUM ASSESSMENT W REFEX TO RESP CULTURE

## 2014-11-28 LAB — EXPECTORATED SPUTUM ASSESSMENT W GRAM STAIN, RFLX TO RESP C

## 2014-11-28 MED ORDER — FUROSEMIDE 10 MG/ML IJ SOLN
40.0000 mg | Freq: Three times a day (TID) | INTRAMUSCULAR | Status: DC
  Administered 2014-11-28 – 2014-11-29 (×3): 40 mg via INTRAVENOUS
  Filled 2014-11-28 (×5): qty 4

## 2014-11-28 NOTE — Evaluation (Signed)
Physical Therapy Evaluation Patient Details Name: [name] MRN: [id number] DOB: [birth year] Today's Date: 11/28/2014   History of Present Illness  Pt. admitted on 11/26/14 with near syncope.  PMH inculdes breast cancer, radiation induced CHF, low EF of 20-25%, non ischemic cardiomyopathy,ICD, depression, anxiety, conversion disorder, fibromyalgia and peripheral neuropathy  Clinical Impression  Pt. Presents to PT with fluctuation in functional mobility/gait/balance and she attributes this to her "conversion disorder". She believes she is at her baseline and states she is about like she was before coming in to the hospital.  She believes she will manage fine at home.  She seemed to have an exacerbation of conversion symptoms at times during eval, especially when therapist was close by.   From a cardiopulmonary standpoint, she had no dizziness or lightheadedness,  HR was stable in the 70s and 80s with in room activity.  O2 sats fluctuated 88 to 93% on room air.  No outward dyspnea was noted.  Pt. Will benefit from another session of PT if she remains in house until tomorrow.    Follow Up Recommendations No PT follow up    Equipment Recommendations  None recommended by PT    Recommendations for Other Services       Precautions / Restrictions Precautions Precautions: Fall Precaution Comments: Twice during PT evaluation, pt. "locked up" by her own description, asked this therapist to "touch her right thigh" to "unlock" her then another episode involving left arm which resolved with therapists touch Restrictions Weight Bearing Restrictions: No      Mobility  Bed Mobility Overal bed mobility: Independent             General bed mobility comments: pt. initially came to EOB with full independence and with no slowing or difficulty; upon return to bed, pt. with labored and exaggerated movements but did not require physical assistance  Transfers Overall transfer level:  Independent Equipment used: None             General transfer comment: On first trial, pt. freely stood from sitting at EOB and transferred to recliner withour physical assist or  overt LOB, occuring at a time when therapist was across room entering infor into computer.  Once therapist was beside pt. on second trial, movements became more labored and slowed, however pt. would not allow close guarding from therapist.    Ambulation/Gait Ambulation/Gait assistance: Modified independent (Device/Increase time);Min assist Ambulation Distance (Feet): 30 Feet (15 x 2) Assistive device: None;1 person hand held assist Gait Pattern/deviations: Step-through pattern;Ataxic;Wide base of support Gait velocity: decreased in direct presence of therapist Gait velocity interpretation: Below normal speed for age/gender General Gait Details: Pt. initially observed as walking with no difficulty at the bedside when therapist was across rom.  Upon therapist accompanying pt., she took labored and slowed steps.  Kerin Ransom came in during ambulation to listen to pt's heart/lungs.  Pt. c/o right leg feeling like it wouldnt move, and asked therapist to touch the thigh tho "unlock it"  Stairs            Wheelchair Mobility    Modified Rankin (Stroke Patients Only)       Balance Overall balance assessment: No apparent balance deficits (not formally assessed);Needs assistance Sitting-balance support: No upper extremity supported;Feet supported Sitting balance-Leahy Scale: Good Sitting balance - Comments: able to cross legs to pull up socks   Standing balance support: No upper extremity supported;During functional activity Standing balance-Leahy Scale: Good Standing balance comment: pt. observed at a distance with  good standing balance; when therapist close by, pt. appeared more dependent in balance though would not allow physical contact                             Pertinent Vitals/Pain  Pain Assessment: 0-10 Pain Score: 8  Pain Location: neck, shoulders, back, knees and ankles ("from my fibromyalgia") Pain Descriptors / Indicators: Constant;Aching;Burning;Jabbing;Sore;Tender Pain Intervention(s): Limited activity within patient's tolerance;Monitored during session;Repositioned;Relaxation  See clinical impression statement above    Home Living Family/patient expects to be discharged to:: Private residence Living Arrangements: Spouse/significant other;Parent;Children (daughter is 97) Available Help at Discharge: Family;Available 24 hours/day Type of Home: House Home Access: Stairs to enter;Other (comment) (stays in basement with her mom in the inlaw suite)     Home Layout: Multi-level (lives on lower level with mom) Home Equipment: Cane - single point;Walker - 2 wheels;Tub bench      Prior Function Level of Independence: Needs assistance (when "conversion disorder is messing up")   Gait / Transfers Assistance Needed: min assist at times, when "my conversion disorder flares up"  ADL's / Homemaking Assistance Needed: pt's mom does the cooking; at times needs min assist  Comments: Pt. had several occasions of "stuttering" and slowed speech when attempting to communicate with therapist     Hand Dominance   Dominant Hand: Right    Extremity/Trunk Assessment   Upper Extremity Assessment: Overall WFL for tasks assessed           Lower Extremity Assessment: Overall WFL for tasks assessed      Cervical / Trunk Assessment: Normal  Communication   Communication: No difficulties;Other (comment) (stuttering and slowed speech at times)  Cognition Arousal/Alertness: Awake/alert Behavior During Therapy: Anxious Overall Cognitive Status: Within Functional Limits for tasks assessed                      General Comments      Exercises        Assessment/Plan    PT Assessment Patient needs continued PT services  PT Diagnosis Difficulty  walking;Abnormality of gait;Acute pain   PT Problem List Decreased activity tolerance;Decreased balance;Decreased mobility;Decreased knowledge of use of DME;Obesity;Impaired sensation  PT Treatment Interventions DME instruction;Gait training;Functional mobility training;Therapeutic exercise;Balance training;Patient/family education   PT Goals (Current goals can be found in the Care Plan section) Acute Rehab PT Goals Patient Stated Goal: home under her mother's care PT Goal Formulation: With patient Time For Goal Achievement: 12/05/14 Potential to Achieve Goals: Good    Frequency Min 3X/week   Barriers to discharge        Co-evaluation               End of Session Equipment Utilized During Treatment: Gait belt Activity Tolerance: Patient tolerated treatment well;Other (comment) (limited by anxiety and "conversion disorder" by her report) Patient left: in bed;with call bell/phone within reach Nurse Communication: Mobility status         Time: 2025-4270 PT Time Calculation (min) (ACUTE ONLY): 29 min   Charges:   PT Evaluation $Initial PT Evaluation Tier I: 1 Procedure PT Treatments $Gait Training: 8-22 mins   PT G CodesLadona Roy 11/28/2014, 9:12 AM Gerlean Ren PT Acute Rehab Services [phone] Beeper [phone]

## 2014-11-28 NOTE — Progress Notes (Signed)
Subjective:  Up with PT. Her conversion disorder is apparent this am.   Objective:  Vital Signs in the last 24 hours: Temp:  [97.3 F (36.3 C)-98 F (36.7 C)] 97.6 F (36.4 C) (06/16 0436) Pulse Rate:  [73-79] 73 (06/16 0436) Resp:  [16-18] 18 (06/16 0436) BP: (87-104)/(56-68) 99/64 mmHg (06/16 0436) SpO2:  [96 %-99 %] 99 % (06/16 0436) Weight:  [251 lb 14.4 oz (114.261 kg)-252 lb 3.3 oz (114.4 kg)] 251 lb 14.4 oz (114.261 kg) (06/16 0436)  Intake/Output from previous day:  Intake/Output Summary (Last 24 hours) at 11/28/14 0842 Last data filed at 11/28/14 0440  Gross per 24 hour  Intake    240 ml  Output   1450 ml  Net  -1210 ml    Physical Exam: General appearance: alert, cooperative, no distress and mildly obese Lungs: clear to auscultation bilaterally Heart: regular rate and rhythm Neurologic: Grossly normal, unsteady on her feet   Rate: 78  Rhythm: normal sinus rhythm and premature ventricular contractions (PVC)  Lab Results:  Recent Labs  11/26/14 1333 11/26/14 2243  WBC 5.4 5.5  HGB 13.6 13.8  PLT 162 152    Recent Labs  11/26/14 1333 11/26/14 2243 11/27/14 0330  NA 137  --  137  K 4.6  --  4.1  CL 101  --  99*  CO2 26  --  29  GLUCOSE 87  --  120*  BUN 21*  --  17  CREATININE 1.42* 1.25* 1.17*    Recent Labs  11/26/14 2243 11/27/14 0330  TROPONINI 0.03 <0.03    Recent Labs  11/26/14 2243  INR 1.36    Scheduled Meds: . aspirin EC  81 mg Oral Daily  . B-complex with vitamin C  1 tablet Oral Daily  . budesonide (PULMICORT) nebulizer solution  0.25 mg Nebulization BID  . carvedilol  12.5 mg Oral BID WC  . cholecalciferol  5,000 Units Oral Daily  . vitamin B-12  2,500 mcg Oral Daily  . digoxin  0.0625 mg Oral Daily  . divalproex  250 mg Oral QHS  . furosemide  40 mg Intravenous BID  . heparin  5,000 Units Subcutaneous 3 times per day  . hydrOXYzine  25 mg Oral QHS  . insulin aspart  0-15 Units Subcutaneous TID WC  .  loratadine  10 mg Oral Daily  . montelukast  10 mg Oral QHS  . multivitamin with minerals  1 tablet Oral Daily  . olopatadine  1 drop Both Eyes BID  . pantoprazole  80 mg Oral Daily  . pentosan polysulfate  200 mg Oral Daily  . potassium chloride SA  20 mEq Oral Once per day on Mon Wed Fri  . sodium chloride  3 mL Intravenous Q12H  . spironolactone  12.5 mg Oral Daily   Continuous Infusions:  PRN Meds:.sodium chloride, acetaminophen, albuterol, ALPRAZolam, baclofen, ondansetron (ZOFRAN) IV, sodium chloride, traMADol, traZODone   Imaging: Dg Chest 2 View  11/26/2014   CLINICAL DATA:  44 year old female with shortness of breath and chest pain for 4 days. Near syncope today. Initial encounter.  EXAM: CHEST  2 VIEW  COMPARISON:  01/03/2014 and earlier.  FINDINGS: Stable left chest cardiac AICD. Chronic moderate to severe cardiomegaly. Mediastinal contours are stable. Interval regressed pulmonary vascular congestion. No pneumothorax, pulmonary edema, pleural effusion or consolidation. Stable postoperative changes to the right axilla and chest wall. Visualized tracheal air column is within normal limits. No acute osseous abnormality identified.  IMPRESSION:  Stable cardiomegaly. No acute cardiopulmonary abnormality.   Electronically Signed   By: Genevie Ann M.D.   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 She is s/p MDT ICD 2013. She presented 11/26/14 with orthostatic near syncope and acute on chronic systolic CHF 02/15/99. She diuresed 1.7L. Her SCr improved with diuresis.   Principal Problem:   Near syncope Active Problems:   Secondary cardiomyopathy s/p chemo therapy   Orthostatic hypotension   Acute on chronic systolic CHF (congestive heart failure)   Conversion disorder   Generalized anxiety disorder   ICD  in place (MDT 2013)   Acute renal insufficiency   Obesity-BMI 38   Essential hypertension   Breast cancer, right breast   Stomach discomfort   PLAN: Check orthostatic B/P this am. Home later, f/u with CHF clinic.  Kerin Ransom PA-C 11/28/2014, 8:42 AM [phone]  Called by RN- two blood samples run show K+ > 7 though both samples hemolyzed. QRS is not wide on telemetry.Will check K+ again and stop Lanoxin, Aldactone, and K+ supplement till repeat K+ is done and accurate.   Patient seen and examined with Kerin Ransom, PA-C. We discussed all aspects of the encounter. I agree with the assessment and plan as stated above.   She is improving slowly. Continue diuresis. Coreg cut back. May need midodrine if BP remains low. Conversion d/o makes assessment difficult. Hopefully we can get her home in am.   Josilynn Losh,MD 6:44 PM

## 2014-11-28 NOTE — Clinical Documentation Improvement (Signed)
Abnormal Lab and/or Testing Results: 6/16:  Sodium: 129.   Possible Clinical Conditions: >  Hyponatremia >  Other >  Not able to determine    Thank you, Russ Halo Documentation Specialist [phone] [email or web address]

## 2014-11-28 NOTE — Progress Notes (Signed)
Pt's K 4.4.  Notified L.Kilroy and instructed not to give Kcl, and ok to give aldactone and digoxin .  Will continue to monitor.  Karie Kirks, Therapist, sports.

## 2014-11-28 NOTE — Progress Notes (Signed)
K > 7.5 called in from lab by Fara Chute , who stated that blood specimen has hemolized x2.  Informed L. Kilroy and instructed lab need to recollect another specimen.  Informed Shelia and she instructed to put in an order for [facility].  Order placed.  Will continue to monitor,

## 2014-11-29 LAB — GLUCOSE, CAPILLARY
GLUCOSE-CAPILLARY: 152 mg/dL — AB (ref 65–99)
Glucose-Capillary: 152 mg/dL — ABNORMAL HIGH (ref 65–99)

## 2014-11-29 LAB — BASIC METABOLIC PANEL
ANION GAP: 11 (ref 5–15)
BUN: 14 mg/dL (ref 6–20)
CO2: 27 mmol/L (ref 22–32)
Calcium: 9.1 mg/dL (ref 8.9–10.3)
Chloride: 98 mmol/L — ABNORMAL LOW (ref 101–111)
Creatinine, Ser: 1.08 mg/dL — ABNORMAL HIGH (ref 0.44–1.00)
GFR calc Af Amer: >60 mL/min (ref >60)
Glucose, Bld: 101 mg/dL — ABNORMAL HIGH (ref 65–99)
Potassium: 4.2 mmol/L (ref 3.5–5.1)
SODIUM: 136 mmol/L (ref 135–145)

## 2014-11-29 NOTE — Progress Notes (Signed)
Discharged patient per MD orders. Reviewed home medication list and home care instructions. Reviewed upcoming appointments. IV and telemetry discontinued. All belongings are with patient. Pt is stable to go home.

## 2014-11-29 NOTE — Care Management Note (Signed)
Case Management Note  Patient Details  Name: [name] MRN: [id number] Date of Birth: [birth year]  Subjective/Objective:    Admitted with Near Syncopy                Action/Plan: Patient lives at home with spouse, she goes to the [facility] for primary care service, Cardiologist is Dr Haroldine Laws- goes to their Heart Failure [facility]; Patient has Medicare and stated that she does not have any problem getting her medication from [facility].   Expected Discharge Date:  11/28/14               Expected Discharge Plan:  Home/Self Care  Discharge planning Services  CM Consult  Status of Service:  In process, will continue to follow   Sherrilyn Rist [phone] 11/29/2014, 11:01 AM

## 2014-11-29 NOTE — Discharge Summary (Signed)
Physician Discharge Summary  Patient ID: [name] MRN: [id number] DOB/AGE: [birth year] 44 y.o.   Primary Cardiologist: Dr. Haroldine Laws  Admit date: 11/26/2014 Discharge date: 11/29/2014  Admission Diagnoses: Near Syncope/ Acute on Chronic Systolic CHF  Discharge Diagnoses:  Principal Problem:   Near syncope Active Problems:   Obesity-BMI 38   Essential hypertension   Secondary cardiomyopathy s/p chemo therapy   Orthostatic hypotension   Conversion disorder   Breast cancer, right breast   Generalized anxiety disorder   Stomach discomfort   Acute on chronic systolic CHF (congestive heart failure)   ICD in place (MDT 2013)   Acute renal insufficiency   Discharged Condition: stable  Hospital Course: The patient is a 44 y/o AAF with a h/o chronic systolic CHF/ nonischemic cardiomyopathy that developed after undergoing chemotherapy for breast cancer. She is s/p ICD, implanted by Dr. Caryl Comes in 2013. [facility] by Dr. Haroldine Laws. Additional PMH includes HTN, obesity and conversion disorder.  She presented to [facility] on 11/26/14 with near syncope. She denied frank syncope and chest pain, but noted dyspnea. She was found to be orthostatic in the ER. VS (Lying BP: 104/67 HR 74, Sitting BP: 87/52 HR 79, Standing BP 85/47 HR 76). She was also felt to be volume overloaded and in acute on chronic CHF. BNP was elevated a 1093.0. Troponin was normal. The patient admitted to a 7 lb weight gain over a 2 day period, in the setting of dietary indiscretion with sodium. She also had acute renal insufficieny with SCr of 1.42 on admit (baseline 0.9).  She was admitted for IV diuretic therapy and monitoring given her orthostatic hypotension and AKI. Her Coreg was reduced to 12.5 mg BID. Symptoms improved and she had no further near syncope and no further orthostatic hypotension. She had good diuresis with IV lasix therapy. I/Os net negative 6 L total. Her LEE resolved and she was  diuresed back to a euvolemic state. She was transitioned back to PO Torsemide.  Discharge weight was 249 lb. Renal function was also monitored and improved throughout her hospitalization. Scr day of discharge continued to show a downward trend and was 1.08. She was last seen and examined by Dr. Haroldine Laws who determined she was stable for discharge home. Post hospital f/u has been arranged in the [facility] on 12/11/14.   Consults: None  Significant Diagnostic Studies:   Initial Orthostatic Vitals 11/26/14  Lying BP: 104/67 HR 74, Sitting BP: 87/52 HR 79, Standing BP 85/47 HR 76  Orthostatic VS for the past 24 hrs:   BP- Lying Pulse- Lying BP- Sitting Pulse- Sitting BP- Standing at 0 minutes Pulse- Standing at 0 minutes  11/29/14 0823 - - - - 110/65 mmHg 87  11/29/14 0821 - - 115/69 mmHg 85 - -  11/29/14 0820 115/69 mmHg 85 - - - -     Treatments: See Hospital Course  Discharge Exam: Blood pressure 105/60, pulse 87, temperature 98.4 F (36.9 C), temperature source Oral, resp. rate 18, height 5\' 7"  (1.702 m), weight 249 lb (112.946 kg), SpO2 100 %.   Disposition: 01-Home or Self Care      Discharge Instructions    Diet - low sodium heart healthy    Complete by:  As directed      Increase activity slowly    Complete by:  As directed             Medication List    STOP taking these medications  amoxicillin-clavulanate 875-125 MG per tablet  Commonly known as:  AUGMENTIN     B-complex with vitamin C tablet     fluconazole 150 MG tablet  Commonly known as:  DIFLUCAN     metolazone 2.5 MG tablet  Commonly known as:  ZAROXOLYN      TAKE these medications        acetaminophen 325 MG tablet  Commonly known as:  TYLENOL  Take 2 tablets (650 mg total) by mouth every 4 (four) hours as needed for headache or mild pain.     albuterol 108 (90 BASE) MCG/ACT inhaler  Commonly known as:  PROAIR HFA  Inhale 1-2 puffs into the lungs every 6 (six) hours as needed  for wheezing or shortness of breath. Pt needs appt for more refills     ALPRAZolam 1 MG tablet  Commonly known as:  XANAX  Take 1 tablet (1 mg total) by mouth at bedtime.     baclofen 10 MG tablet  Commonly known as:  LIORESAL  TAKE 1 TABLET BY MOUTH 2 (TWO) TIMES DAILY AS NEEDED FOR MUSCLE SPASMS.     beclomethasone 80 MCG/ACT inhaler  Commonly known as:  QVAR  Inhale 2 puffs into the lungs See admin instructions. TAKES EVERY MORNING AND SOMETIMES WHEN NEEDED IN EVENINGS     carvedilol 25 MG tablet  Commonly known as:  COREG  Take 0.5 tablets (12.5 mg total) by mouth 2 (two) times daily with a meal.     Co Q-10 100 MG Caps  Take 100 mg by mouth daily.     Cyanocobalamin 2500 MCG Subl  Place 2,500 mcg under the tongue daily.     digoxin 0.125 MG tablet  Commonly known as:  LANOXIN  Take 0.5 tablets (0.0625 mg total) by mouth daily.     divalproex 250 MG 24 hr tablet  Commonly known as:  DEPAKOTE ER  TAKE 1 TABLET (250 MG TOTAL) BY MOUTH DAILY.     GLUCOSAMINE-CHONDROITIN PO  Take 1,200 mg by mouth daily.     hydrOXYzine 25 MG tablet  Commonly known as:  ATARAX/VISTARIL  Take 25 mg by mouth at bedtime. DOESN'T TAKE WITH TRAZODONE     loratadine 10 MG tablet  Commonly known as:  CLARITIN  Take 10 mg by mouth daily.     montelukast 10 MG tablet  Commonly known as:  SINGULAIR  TAKE 1 TABLET (10 MG TOTAL) BY MOUTH AT BEDTIME.     multivitamin with minerals Tabs tablet  Take 1 tablet by mouth daily.     nystatin 100000 UNIT/ML suspension  Commonly known as:  MYCOSTATIN  Take 5 mLs (500,000 Units total) by mouth 4 (four) times daily.     omeprazole 20 MG capsule  Commonly known as:  PRILOSEC  Take 1 capsule (20 mg total) by mouth daily.     PATADAY 0.2 % Soln  Generic drug:  Olopatadine HCl  Place 1 drop into both eyes daily.     pentosan polysulfate 100 MG capsule  Commonly known as:  ELMIRON  Take 1 capsule (100 mg total) by mouth 2 (two) times daily.      potassium chloride SA 20 MEQ tablet  Commonly known as:  K-DUR,KLOR-CON  Take 1 tablet (20 mEq total) by mouth 3 (three) times a week. Every Mon, Wed and Fri with torsemide     spironolactone 25 MG tablet  Commonly known as:  ALDACTONE  Take 0.5 tablets (12.5 mg total) by mouth daily.  torsemide 20 MG tablet  Commonly known as:  DEMADEX  Take 1 tablet (20 mg total) by mouth 3 (three) times a week. Every Mon, Wed and Fri     traZODone 50 MG tablet  Commonly known as:  DESYREL  Take 50 mg by mouth as needed. FOR SLEEP     ULTRAM 50 MG tablet  Generic drug:  traMADol  Take 50 mg by mouth daily as needed.     Vitamin D3 5000 UNITS Caps  Take 1 capsule by mouth daily.       Follow-up Information    Follow up with Glori Bickers, MD. Go on 12/11/2014.   Specialty:  Cardiology   Why:  at 3:20pm in the [facility]--gate code 0800--please bring all medications to appt   Contact information:   [place] [address] [phone]       TIME SPENT ON DISCHARGE, [place]: >30 MINUTES  Signed: Lyda Jester 11/29/2014, 12:38 PM  Patient seen and examined with Lyda Jester, PA-C. We discussed all aspects of the encounter. I agree with the assessment and plan as stated above.   She is improved. [place] for d/c today with f/u in the [facility] Clinic.   Bensimhon, Daniel,MD 2:06 PM

## 2014-11-29 NOTE — Progress Notes (Signed)
Patient Profile: 44 year old AA female with a significant past medical history of secondary cardiomyopathy from chemo therapy (EF 20-25%), conversion disorder, anxiety, and congestive heart failure. She is s/p MDT ICD 2013. She presented 11/26/14 with orthostatic near syncope and acute on chronic systolic CHF.   Subjective: No dyspnea sitting up. Still sleeping with head of bed elevated. No further near syncope. Reports near panic/anxiety attack earlier this am. Now back to baseline.   Objective: Vital signs in last 24 hours: Temp:  [97.9 F (36.6 C)-98.4 F (36.9 C)] 98.4 F (36.9 C) (06/17 0437) Pulse Rate:  [73-82] 82 (06/17 0437) Resp:  [16-18] 16 (06/17 0437) BP: (92-105)/(52-63) 105/56 mmHg (06/17 0437) SpO2:  [94 %-100 %] 100 % (06/17 0437) Weight:  [249 lb (112.946 kg)] 249 lb (112.946 kg) (06/17 0437) Last BM Date: 11/27/14  Intake/Output from previous day: 06/16 0701 - 06/17 0700 In: 840 [P.O.:840] Out: 4000 [Urine:4000] Intake/Output this shift:    Medications Current Facility-Administered Medications  Medication Dose Route Frequency Provider Last Rate Last Dose  . 0.9 %  sodium chloride infusion  250 mL Intravenous PRN Isaiah Serge, NP      . acetaminophen (TYLENOL) tablet 650 mg  650 mg Oral Q4H PRN Isaiah Serge, NP   650 mg at 11/27/14 2000  . albuterol (PROVENTIL) (2.5 MG/3ML) 0.083% nebulizer solution 2.5 mg  2.5 mg Nebulization Q6H PRN Troy Sine, MD      . ALPRAZolam Duanne Moron) tablet 1 mg  1 mg Oral QHS PRN Isaiah Serge, NP      . aspirin EC tablet 81 mg  81 mg Oral Daily Isaiah Serge, NP   81 mg at 11/28/14 1201  . B-complex with vitamin C tablet 1 tablet  1 tablet Oral Daily Isaiah Serge, NP   1 tablet at 11/28/14 1000  . baclofen (LIORESAL) tablet 10 mg  10 mg Oral BID PRN Isaiah Serge, NP      . budesonide (PULMICORT) nebulizer solution 0.25 mg  0.25 mg Nebulization BID Troy Sine, MD   0.25 mg at 11/28/14 2127  . carvedilol (COREG)  tablet 12.5 mg  12.5 mg Oral BID WC Doreene Burke Kilroy, PA-C   12.5 mg at 11/27/14 1832  . cholecalciferol (VITAMIN D) tablet 5,000 Units  5,000 Units Oral Daily Isaiah Serge, NP   5,000 Units at 11/28/14 1202  . cyanocobalamin tablet 2,500 mcg  2,500 mcg Oral Daily Troy Sine, MD   2,500 mcg at 11/28/14 1408  . digoxin (LANOXIN) tablet 0.0625 mg  0.0625 mg Oral Daily Isaiah Serge, NP   0.0625 mg at 11/28/14 1409  . divalproex (DEPAKOTE ER) 24 hr tablet 250 mg  250 mg Oral QHS Isaiah Serge, NP   250 mg at 11/28/14 2239  . furosemide (LASIX) injection 40 mg  40 mg Intravenous TID Conrad [place], NP   40 mg at 11/28/14 2239  . heparin injection 5,000 Units  5,000 Units Subcutaneous 3 times per day Isaiah Serge, NP   5,000 Units at 11/29/14 1761  . hydrOXYzine (ATARAX/VISTARIL) tablet 25 mg  25 mg Oral QHS Troy Sine, MD   25 mg at 11/28/14 2239  . insulin aspart (novoLOG) injection 0-15 Units  0-15 Units Subcutaneous TID WC Troy Sine, MD   0 Units at 11/27/14 0940  . loratadine (CLARITIN) tablet 10 mg  10 mg Oral Daily Isaiah Serge, NP   10 mg  at 11/28/14 1201  . montelukast (SINGULAIR) tablet 10 mg  10 mg Oral QHS Isaiah Serge, NP   10 mg at 11/28/14 2239  . multivitamin with minerals tablet 1 tablet  1 tablet Oral Daily Isaiah Serge, NP   1 tablet at 11/28/14 1201  . olopatadine (PATANOL) 0.1 % ophthalmic solution 1 drop  1 drop Both Eyes BID Isaiah Serge, NP   1 drop at 11/28/14 2240  . ondansetron (ZOFRAN) injection 4 mg  4 mg Intravenous Q6H PRN Isaiah Serge, NP   4 mg at 11/28/14 1844  . pantoprazole (PROTONIX) EC tablet 80 mg  80 mg Oral Daily Isaiah Serge, NP   80 mg at 11/28/14 1233  . pentosan polysulfate (ELMIRON) capsule 200 mg  200 mg Oral Daily Isaiah Serge, NP   200 mg at 11/28/14 1220  . potassium chloride SA (K-DUR,KLOR-CON) CR tablet 20 mEq  20 mEq Oral Once per day on Mon Wed Fri Isaiah Serge, NP   20 mEq at 11/27/14 0932  . sodium chloride 0.9 %  injection 3 mL  3 mL Intravenous Q12H Isaiah Serge, NP   3 mL at 11/28/14 2241  . sodium chloride 0.9 % injection 3 mL  3 mL Intravenous PRN Isaiah Serge, NP      . spironolactone (ALDACTONE) tablet 12.5 mg  12.5 mg Oral Daily Isaiah Serge, NP   12.5 mg at 11/28/14 1410  . traMADol (ULTRAM) tablet 50 mg  50 mg Oral Daily PRN Isaiah Serge, NP   50 mg at 11/26/14 2357  . traZODone (DESYREL) tablet 50 mg  50 mg Oral QHS PRN Isaiah Serge, NP       Filed Weights   11/27/14 1900 11/28/14 0436 11/29/14 0437  Weight: 252 lb 3.3 oz (114.4 kg) 251 lb 14.4 oz (114.261 kg) 249 lb (112.946 kg)    PE: General appearance: alert, cooperative and no distress Neck: no carotid bruit and no JVD Lungs: clear to auscultation bilaterally Heart: regular rate and rhythm, S1, S2 normal, no murmur, click, rub or gallop Extremities: 1+ bilateral pitting edema Pulses: 2+ and symmetric Skin: warm and dry Neurologic: Grossly normal  Lab Results:   Recent Labs  11/26/14 1333 11/26/14 2243  WBC 5.4 5.5  HGB 13.6 13.8  HCT 40.7 41.6  PLT 162 152   BMET  Recent Labs  11/28/14 0856 11/28/14 1142 11/29/14 0326  NA 129* 135 136  K >7.5* 4.4 4.2  CL 95* 99* 98*  CO2 22 27 27   GLUCOSE 106* 165* 101*  BUN 16 15 14   CREATININE 1.05* 1.10* 1.08*  CALCIUM 8.3* 8.9 9.1   PT/INR  Recent Labs  11/26/14 2243  LABPROT 16.9*  INR 1.36     Assessment/Plan  Principal Problem:   Near syncope Active Problems:   Obesity-BMI 38   Essential hypertension   Secondary cardiomyopathy s/p chemo therapy   Orthostatic hypotension   Conversion disorder   Breast cancer, right breast   Generalized anxiety disorder   Stomach discomfort   Acute on chronic systolic CHF (congestive heart failure)   ICD in place (MDT 2013)   Acute renal insufficiency    1. Near Syncope: 2/2 orthostatic hypotension. Per records, her last orthostatic vitals check was 3/50 (diastolic pressure drop of more than 10 mmHg  from lying to sitting). Coreg decreased yesterday. BP this am 105/56. Pt denies any further symptoms. Will ask RN to repeat orthostatics this  am.  2. Acute on Chronic Systolic CHF: EF 35-32%, secondary to chemotherapy.  Good diuresis. -4L out in past 24 hrs. I/Os net negative 4.8L total.  LEE improved but still with 1+ bilateral pitting edema. Weight is decreasing but still above dry weight of 242 lb (10/2014). 249 lb today. IV Lasix just given. Can likely transition back to PO torsemide later today.   3. Abnormal BMP: K normal today at 4.2. Was > 7 yesterday but due to hemolyzed sample. K stable on spironolactone and K-Dur.  4. Secondary Cardiomyopathy: from chemo. EF 20-25%. Has an ICD for primary prevention. On Coreg, spironolactone and digoxin.    5. Acute Renal Insufficiency: Scr improved. Was 1.42 on admit. 1.08 today. Baseline is 0.9.       LOS: 3 days    [name] 11/29/2014 7:37 AM  Patient seen and examined with Krystal M. Rosita Fire, PA-C. We discussed all aspects of the encounter. I agree with the assessment and plan as stated above. She is much improved. No longer orthostatic after dropping carvedilol back. Can go home today. Would cut back carvedilol to 12.5 bid. Continue demadex 3x/week and as needed for weight gain.   F/u in HF clinic 2-3 weeks.   Bensimhon, Daniel,MD 11:28 AM

## 2014-12-01 LAB — CUP PACEART REMOTE DEVICE CHECK
Date Time Interrogation Session: 20160614092608
HIGH POWER IMPEDANCE MEASURED VALUE: 361 Ohm
HighPow Impedance: 67 Ohm
Lead Channel Impedance Value: 361 Ohm
Lead Channel Pacing Threshold Pulse Width: 0.4 ms
Lead Channel Sensing Intrinsic Amplitude: 17.625 mV
Lead Channel Sensing Intrinsic Amplitude: 17.625 mV
Lead Channel Setting Pacing Pulse Width: 0.4 ms
Lead Channel Setting Sensing Sensitivity: 0.3 mV
MDC IDC MSMT BATTERY VOLTAGE: 3.12 V
MDC IDC MSMT LEADCHNL RV PACING THRESHOLD AMPLITUDE: 1 V
MDC IDC SET LEADCHNL RV PACING AMPLITUDE: 2.25 V
MDC IDC STAT BRADY RV PERCENT PACED: 0 %
Zone Setting Detection Interval: 250 ms
Zone Setting Detection Interval: 300 ms
Zone Setting Detection Interval: 400 ms

## 2014-12-02 ENCOUNTER — Telehealth: Payer: Self-pay | Admitting: Cardiovascular Disease

## 2014-12-02 NOTE — Telephone Encounter (Signed)
Needs a D/ C phone Call . Appt is 12/11/14 at 3:20pm

## 2014-12-04 ENCOUNTER — Other Ambulatory Visit: Payer: Self-pay | Admitting: Family

## 2014-12-09 ENCOUNTER — Other Ambulatory Visit: Payer: Self-pay | Admitting: Pulmonary Disease

## 2014-12-09 ENCOUNTER — Other Ambulatory Visit: Payer: Self-pay | Admitting: Neurology

## 2014-12-09 NOTE — Telephone Encounter (Signed)
LMTCB

## 2014-12-09 NOTE — Telephone Encounter (Signed)
Patient contacted regarding discharge from [facility] on 11/29/14.  Patient understands to follow up with provider [facility] on 12/11/14 at 3:20 PM at [place] . Patient understands discharge instructions? yes  Patient understands medications and regiment? yes Patient understands to bring all medications to this visit? yes

## 2014-12-09 NOTE — Telephone Encounter (Signed)
Hasn't been seen in over 1 year.  Appears she was referred to another neurologist.

## 2014-12-11 ENCOUNTER — Ambulatory Visit (HOSPITAL_COMMUNITY)
Admit: 2014-12-11 | Discharge: 2014-12-11 | Disposition: A | Discharge status: Home / Self Care | Payer: Medicare Other | Source: Ambulatory Visit | Attending: Internal Medicine | Admitting: Internal Medicine

## 2014-12-11 ENCOUNTER — Encounter (HOSPITAL_COMMUNITY): Payer: Self-pay

## 2014-12-11 VITALS — BP 108/68 | HR 88 | Wt 243.5 lb

## 2014-12-11 DIAGNOSIS — R5381 Other malaise: Secondary | ICD-10-CM | POA: Diagnosis not present

## 2014-12-11 DIAGNOSIS — F449 Dissociative and conversion disorder, unspecified: Secondary | ICD-10-CM | POA: Diagnosis not present

## 2014-12-11 DIAGNOSIS — I5022 Chronic systolic (congestive) heart failure: Secondary | ICD-10-CM | POA: Diagnosis not present

## 2014-12-11 LAB — BASIC METABOLIC PANEL
ANION GAP: 11 (ref 5–15)
BUN: 13 mg/dL (ref 6–20)
CHLORIDE: 98 mmol/L — AB (ref 101–111)
CO2: 27 mmol/L (ref 22–32)
Calcium: 9.4 mg/dL (ref 8.9–10.3)
Creatinine, Ser: 1.1 mg/dL — ABNORMAL HIGH (ref 0.44–1.00)
GFR calc Af Amer: >60 mL/min (ref >60)
GFR calc non Af Amer: >60 mL/min (ref >60)
Glucose, Bld: 94 mg/dL (ref 65–99)
Potassium: 4.4 mmol/L (ref 3.5–5.1)
SODIUM: 136 mmol/L (ref 135–145)

## 2014-12-11 LAB — BRAIN NATRIURETIC PEPTIDE: B Natriuretic Peptide: 599.8 pg/mL — ABNORMAL HIGH (ref 0.0–100.0)

## 2014-12-11 MED ORDER — TORSEMIDE 20 MG PO TABS: 20.0000 mg | ORAL_TABLET | Freq: Every day | ORAL | Status: DC

## 2014-12-11 NOTE — Addendum Note (Signed)
Encounter addended by: Harvie Junior, CMA on: 12/11/2014  4:36 PM<BR>     Documentation filed: Dx Association, Patient Instructions Section, Orders

## 2014-12-11 NOTE — Progress Notes (Signed)
Patient ID: [name], female   DOB: [birth year], 44 y.o.   MRN: [id number]  Primary Cardiologist:  Dr. Arvilla Meres Neurlogist: Dohmeir   PCP: Dr. Consuella Lose Griffin/Noel Redmon, PAC   History of Present Illness: [name] is a 44 y.o. female with a history of morbid obesity, depression, fibromyalgia, congestive heart failure secondary to nonischemic cardiomyopathy (EF 20-25%) related to her chemotherapy for breast cancer. Last chemo 2008. Evaluated by neuro for stuttering.  Felt to have a conversion disorder. Confirmed by [facility].   She presented to [facility] on 11/26/14 with near syncope and dyspnea.With + orthostatics in ER (SBP 104 to 87 from lying to sitting) She also had volume overload with acute on chronic CHF. BNP 1093.0. Troponins normal. Had 7 lb  gain over 2 days with dietary indiscretion of sodium.Admitted for IV diuretic therapy and monitoring of BP. Coreg reduced 12.5 mg BID. Symptoms improved and good diuresis with IV lasix therapy. I/Os net negative 6 L total. Was transitioned back to PO Torsemide once euvolemic. Discharge weight was 249 lb.  Evaluated by pulmonary August 12 and Dr Vassie Loll told he saw no evidence of asthma.    She returns today for post hospital follow up.  She continues to struggle with LE edema. Has taken torsemide almost every day. Also took metolazone yesterday. Has good response. Weighing everyday.  Weight at home 243. Remains SOB with any activity. Dizziness much improved with backing down carvedilol.  Continues to struggle with drop attacks from conversion d/p.   At last visit digoxin decreased due to elevated level. Weight down 6 pounds.  Taking all medications. She is taking torsemide 20 mg daily 2-3 times a week when her belly feels full or left foot swells. Says she takes metolazone 1-2 times a month. Breathing better unless fluid level up. Still gets dyspnea with minimal exertion. Struggles with ADLs.   ICD interrogated personally: No VT/AF. Activity  level < 1 hour. Optivol ok.   Studies:  CPX Feb 2010. Peak VO2 was 14.5 which was 71% of predicted. When corrected for body weight the VO2 was 20.7. The slope was 27. RER 1.20. O2 pulse 73%. Overall this was felt to be only a very mild functional limitation due to her obesity and mild circulatory limitation.  CPX 2/11: pVO2 13.0 (72%) correct for ideal wt 30ml/kg/min RER 1.06 (submax) slope 28.2 O2 pulse 91% - felt no signifcant cardiac limitation. + deconditioning.   Echo 04/2009  EF back down to 25%.  RHC normal. Echo 07/2009 showed EF 50%  Echo 06/2010 EF 40%. So we did MUGA EF 58% Echo 12/2010 40-45% Echo 05/2011 EF 30%. Grade 2 diastolic dysfunction Cath (12/28/10) was set up and demonstrated EF 20-25% and normal coronary arteries.  RA  4, RV 35/11, PA  28/8 (17), PCWP 8. Fick 5.2/2.4 PVR 1.7 Woods. Fick 5.2/2.4 Aortic saturation was 97%.  PA saturation was 67% and 68%. ECHO 09/03/13 EF 20-25%  ECHO 06/24/14 EF 20-25% Cardiac MRI on 07/19/11: Moderate to severe LVE, Diffuse hypokinesis. EF 32%, Mild LAE Labs  09/03/13 k 3.3 Creatinine 0.86 Pro BNP 998 Dig level 0.7 05/17/14 K 4.7 Creatinine 0.9 pBNP 1007 digoxin 1.2 11/29/14 K 4.2 Creatinine 1.08 Dig 0.3  SH: lived with her husband. Does not drive. Does not drink alcohol or smoke   Past Medical History  Diagnosis Date  . 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 Cardiac  MRI 05/2011 EF 32%. ICD implantation 10/2011 ([employer]tronic)  . Peripheral neuropathy     chemo- induced  . Hypertension     c/b orthostatic hypotention  . Depression   . Palpitation     normal sinus rhythm only on 21 day heart monitor  . Obesity   . Fibromyalgia   . GERD (gastroesophageal reflux disease)   . Breast calcifications     right breast  . Interstitial cystitis   . Anxiety   . Nonischemic cardiomyopathy     related to chemo; EF 30% 05/2011  . ICD  (implantable cardiac defibrillator) in place   . Pneumonia 2000's    "once"  . Type II diabetes mellitus   . Blood transfusion 1980's    1987 or 1988  . Anemia 1980's    [id number]  . History of stomach ulcers   . Breast cancer     right;  . Conversion disorder   . 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   . 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 acetaminophen (TYLENOL) 325 MG tablet Take 2 tablets (650 mg total) by mouth every 4 (four) hours as needed for headache or mild pain.    Marland Kitchen albuterol (PROAIR HFA) 108 (90 BASE) MCG/ACT inhaler Inhale 1-2 puffs into the lungs every 6 (six) hours as needed for wheezing or shortness of breath. Pt needs appt for more refills 8.5 each 0  . ALPRAZolam (XANAX) 1 MG tablet TAKE 1 TABLET AT BEDTIME 30 tablet 0  . baclofen (LIORESAL) 10 MG tablet TAKE 1 TABLET BY MOUTH 2 (TWO) TIMES DAILY AS NEEDED FOR MUSCLE SPASMS. 60 tablet 0  . beclomethasone (QVAR) 80 MCG/ACT inhaler Inhale 2 puffs into the lungs See admin instructions. TAKES EVERY MORNING AND SOMETIMES WHEN NEEDED IN EVENINGS    . carvedilol (COREG) 25 MG tablet Take 0.5 tablets (12.5 mg total) by mouth 2 (two) times daily with a meal. 60 tablet 6  . Cholecalciferol (VITAMIN D3) 5000 UNITS CAPS Take 1 capsule by mouth daily.     . Coenzyme Q10 (CO Q-10) 100 MG CAPS Take 100 mg by mouth daily.    . Cyanocobalamin 2500 MCG SUBL Place 2,500 mcg under the tongue daily.    . digoxin (LANOXIN) 0.125 MG tablet Take 0.5 tablets (0.0625 mg total) by mouth daily. 30 tablet 6  . divalproex (DEPAKOTE ER) 250 MG 24 hr tablet TAKE 1 TABLET (250 MG TOTAL) BY MOUTH DAILY. (Patient taking differently: TAKE 1 TABLET (250 MG TOTAL) BY MOUTH DAILY AT BEDTIME) 90 tablet 2  . GLUCOSAMINE-CHONDROITIN  PO Take 1,200 mg by mouth daily.    . hydrOXYzine (ATARAX/VISTARIL) 25 MG tablet Take 25 mg by mouth at bedtime. DOESN'T TAKE WITH TRAZODONE    . loratadine (CLARITIN) 10 MG tablet Take 10 mg by mouth daily.    . montelukast (SINGULAIR) 10 MG tablet TAKE 1 TABLET (10 MG TOTAL) BY MOUTH AT BEDTIME. 30 tablet 0  . Multiple Vitamin (MULTIVITAMIN WITH MINERALS) TABS Take 1 tablet by mouth daily.    Marland Kitchen nystatin (MYCOSTATIN) 100000 UNIT/ML suspension Take 5 mLs (500,000 Units total) by mouth 4 (four) times daily. 60 mL 0  . omeprazole (PRILOSEC) 20 MG capsule Take 1 capsule (20 mg total) by mouth daily. 30 capsule 5  . PATADAY 0.2 %  SOLN Place 1 drop into both eyes daily.     . pentosan polysulfate (ELMIRON) 100 MG capsule Take 1 capsule (100 mg total) by mouth 2 (two) times daily. (Patient taking differently: Take 200 mg by mouth daily. ) 60 capsule 3  . potassium chloride SA (K-DUR,KLOR-CON) 20 MEQ tablet Take 1 tablet (20 mEq total) by mouth 3 (three) times a week. Every Mon, Wed and Fri with torsemide    . spironolactone (ALDACTONE) 25 MG tablet Take 0.5 tablets (12.5 mg total) by mouth daily. 15 tablet 6  . torsemide (DEMADEX) 20 MG tablet Take 1 tablet (20 mg total) by mouth 3 (three) times a week. Every Mon, Wed and Fri    . traMADol (ULTRAM) 50 MG tablet Take 50 mg by mouth daily as needed.    . traZODone (DESYREL) 50 MG tablet Take 50 mg by mouth as needed. FOR SLEEP     No current facility-administered medications on file prior to encounter.     Allergies  Allergen Reactions  . Reglan [Metoclopramide Hcl] Anxiety  . Adhesive [Tape] Itching and Rash  . Other Itching and Rash    Chloraprep  . Ace Inhibitors     hyperkalemia  . Losartan     hyperkalemia  . Chlorhexidine Itching and Rash   ROS: All pertinent positives or negatives as in HPI otherwise negative   Vital Signs: Filed Vitals:   12/11/14 1543  BP: 108/68  Pulse: 88  Weight: 243 lb 8 oz (110.451 kg)  SpO2: 96%    Sitting 106/76 Standing 102/74  PHYSICAL EXAM: Well nourished, well developed, in no acute distress. Husband present  HEENT: normal Neck: JVP flat Cardiac:  Lateral PMI, normal S1, S2; RRR; no murmur, No S3L upper chest scar ICD Lungs:  clear to auscultation bilaterally, no wheezing, rhonchi or rales Abd: obese, soft, nontender, nondistended Ext: warm 1+ lower extremity edema.    Skin: warm and dry Neuro:  CNs 2-12 intact, no focal abnormalities noted Psych: Normal affect  ASSESSMENT AND PLAN: 1. Chronic systolic CHF: Nonischemic cardiomyopathy.  [employer]tronic ICD. I reviewed echo today personally and EF remains 20-25% with moderately dilated LV.  NYHA III-IIIB.  - Severely deconditioned and depressed and is not motivated to be more active.  - Volume status ok on exam and by optivol. Would take torsemide 20 mg daily. Can hold if weight < 240.  - On carvedilol  12.5 mg twice a day. Dose recently reduced due to orthostasis.  - On dig 0.0625 mg daily - BP too low for losartan. Also had hyperkalemia in past so we have avoided.  - Check labs today 2. Conversion disorder: Followed by neurology  3. Morbid obesity: Needs to work at diet/exercise for weight loss.  4. Deconditioning - She is very debilitated. She says she will use the exercise bike at home.   Jax Abdelrahman,MD 4:20 PM

## 2014-12-11 NOTE — Patient Instructions (Signed)
DECREASE Torsmeide to 20mg  daily.  LABS today (bmet bnp)  FOLLOW UP in 3 months.

## 2014-12-11 NOTE — Addendum Note (Signed)
Encounter addended by: Jolaine Artist, MD on: 12/11/2014  4:23 PM<BR>     Documentation filed: Charges VN

## 2014-12-13 ENCOUNTER — Telehealth: Payer: Self-pay | Admitting: Neurology

## 2014-12-13 ENCOUNTER — Other Ambulatory Visit: Payer: Self-pay | Admitting: Neurology

## 2014-12-13 DIAGNOSIS — S0592XA Unspecified injury of left eye and orbit, initial encounter: Secondary | ICD-10-CM

## 2014-12-13 HISTORY — DX: Unspecified injury of left eye and orbit, initial encounter: S05.92XA

## 2014-12-13 MED ORDER — DIVALPROEX SODIUM ER 250 MG PO TB24: ORAL_TABLET | ORAL | Status: DC

## 2014-12-13 NOTE — Telephone Encounter (Addendum)
Rx has been sent.  I called back to advise.  Got no answer.  Left message.   

## 2014-12-13 NOTE — Telephone Encounter (Signed)
Pt needs refill on divalproex (DEPAKOTE ER) 250 MG 24 hr tablet. She is scheduled to come in for appt. July 8th at 11:30. States she does not have enough meds to cover her until appt. Please call and advise [phone].

## 2014-12-20 ENCOUNTER — Encounter: Payer: Self-pay | Admitting: Nurse Practitioner

## 2014-12-20 ENCOUNTER — Encounter: Payer: Self-pay | Admitting: Cardiology

## 2014-12-20 ENCOUNTER — Ambulatory Visit (INDEPENDENT_AMBULATORY_CARE_PROVIDER_SITE_OTHER): Payer: Medicare Other | Admitting: Nurse Practitioner

## 2014-12-20 VITALS — HR 88 | Ht 67.0 in | Wt 250.0 lb

## 2014-12-20 DIAGNOSIS — G253 Myoclonus: Secondary | ICD-10-CM

## 2014-12-20 MED ORDER — DIVALPROEX SODIUM ER 250 MG PO TB24: 250.0000 mg | ORAL_TABLET | Freq: Two times a day (BID) | ORAL | Status: DC

## 2014-12-20 NOTE — Progress Notes (Signed)
I agree with the above plan 

## 2014-12-20 NOTE — Patient Instructions (Signed)
Increase Depakote to 250 mg twice daily, will refill Appointment next week July 13 10 AM with Dr. Brett Fairy

## 2014-12-20 NOTE — Progress Notes (Signed)
[facility]  PATIENT: [name] DOB: 1970/10/23   REASON FOR VISIT: Myoclonus  HISTORY FROM: Patient    HISTORY OF PRESENT ILLNESS: [name], 44 year old female returns for follow-up she was last seen in this office by Dr. Brett Fairy 07/25/2013. At that time she noticed myoclonus during exam. EMG nerve conduction done by Dr. Jannifer Franklin was normal, repetitive nerve stimulation study was normal. She was placed on Depakote 250 daily. She returns today for refills. In addition she wants treatment for her conversion disorder. She had an admission 11/26/2014 for congestive heart failure. 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 Unable to get her blood pressure by palpation we do not have Doppler.    HISTORY (CD)[name] is a 44 y.o. female Is seen here as a work in patient , after recent sleep study from 9 07-23-13. The patient underwent an expanded sleep study with full EEG and o2 monitoring.  CO2 was monitored for only 70 minutes , due to technical difficulties, no co2 retention was seen in the recorded time. The study found an AHI of 6.2 and hypoxemia for a prolonged period of time.  The patient had no Video activity or seizure activity.  I am concerned about the ongoing dysphonia, and weakness, proximal - and she reports waxing and waning,but is never free of malaise and fatigue and weakness.  She is dry in mouth and eyes, and will need to be evaluated for myopathy and paraneoplatic symptoms. She had Hemiplegia without stroke on CT and MRI.  During today's evaluation I also noticed myoclonia, she has a hemiparesis seems to drift to the right side of her body is rather rigid she can squeeze but cannot release her hands and repeated activity does not make it easier to release her hands. Her mother does not have any of the symptoms, and she reports no exercise or food intake related myotonia, paratonia.  Last visit :  [name] is  a well-established patient in our practice I have followed for over 6 years now. The patient developed breast cancer at a young age, she was diagnosed more or less coincidental after a mammogram was offered at her workplace. She underwent surgery, chemotherapy and radiation. Late effects of her therapies included a cardiomyopathy with reduced ejection fraction -for which he follows regularly with cardiology, a neuropathy, and overall lethargy and malaise.  The patient never felt quite as energetic or rested as she did prior to her cance diagnoses. She also developed diabetes and PICA. She is eating ice cubes and starch, she tested negative for iron deficiency anemia. 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 She still speaks this way today, poorly modulated dysarthic speech today. Brain MRI in 07-2011 , before implanation of her defibrillator , was normal- no extensive WM disease, nor metastatis, nor strokes.  EEG was normal in February 2013 ,but for benzodiazepine related artefact.  A polysomnography on 08-06-2011 evaluated her for fatigue and EDS, and returned with normal results, her AHI was 0.5/hr. She has a lot of fatigue she does not sleep well and her mother now reported that she has been jerks and jumps.  The way her mother describes it to me today is that [name] twitches in all 4 extremities at once- sometimes has ophistotonus, the whole body jerking with a pelvic thrust  , and sometimes the movements  are repeated so often that she is concerned about her daughter contracting injuries. The spells seem to star mild and increase in amplitude, she is hitting furniture.  She has also had a tongue bite ,chewed her tongue at night which is frequently associated with seizures. She is completely amnestic for the events, and her  mother reports these can go on for several minutes, 20- 30 minutes. She remains asleep and does not wake up. At times she yells loudly in her sleep, amnestic for these spells , too.  She has noticed isolated myoclonic jerks in one hand or foot in daytime, mostly in her right , dominant hand.  She is just very tired afterwards. She has not had incontinence during these events.    REVIEW OF SYSTEMS: Full 14 system review of systems performed and notable only for those listed, all others are neg:  Constitutional: Fatigue  Cardiovascular: Palpitations murmur Ear/Nose/Throat: Facial swelling, trouble swallowing Skin: neg Eyes: Redness, double vision Respiratory: Wheezing shortness of breath Gastroitestinal: Swollen abdomen, abdominal pain, black stools constipation nausea and vomiting difficulty urinating Hematology/Lymphatic: neg  Endocrine: Intolerance to heat excessive thirst Musculoskeletal: Joint pain back pain walking difficulty Allergy/Immunology: Environmental allergies Neurological: Memory loss, dizziness, headache, seizure, speech difficulty, tremors Psychiatric: Agitation and confusion depression and anxiety Sleep : Daytime sleepiness insomnia ALLERGIES: Allergies  Allergen Reactions  . Reglan [Metoclopramide Hcl] Anxiety  . Adhesive [Tape] Itching and Rash  . Other Itching and Rash    Chloraprep  . Ace Inhibitors     hyperkalemia  . Losartan     hyperkalemia  . Chlorhexidine Itching and Rash    HOME MEDICATIONS: Outpatient Prescriptions Prior to Visit  Medication Sig Dispense Refill  . acetaminophen (TYLENOL) 325 MG tablet Take 2 tablets (650 mg total) by mouth every 4 (four) hours as needed for headache or mild pain.    Marland Kitchen albuterol (PROAIR HFA) 108 (90 BASE) MCG/ACT inhaler Inhale 1-2 puffs into the lungs every 6 (six) hours as needed for wheezing or shortness of breath. Pt needs appt for more refills 8.5 each 0  . ALPRAZolam (XANAX) 1 MG tablet TAKE 1 TABLET AT  BEDTIME 30 tablet 0  . baclofen (LIORESAL) 10 MG tablet TAKE 1 TABLET BY MOUTH 2 (TWO) TIMES DAILY AS NEEDED FOR MUSCLE SPASMS. 60 tablet 0  . beclomethasone (QVAR) 80 MCG/ACT inhaler Inhale 2 puffs into the lungs See admin instructions. TAKES EVERY MORNING AND SOMETIMES WHEN NEEDED IN EVENINGS    . carvedilol (COREG) 25 MG tablet Take 0.5 tablets (12.5 mg total) by mouth 2 (two) times daily with a meal. 60 tablet 6  . Cholecalciferol (VITAMIN D3) 5000 UNITS CAPS Take 1 capsule by mouth daily.     . Coenzyme Q10 (CO Q-10) 100 MG CAPS Take 100 mg by mouth daily.    . Cyanocobalamin 2500 MCG SUBL Place 2,500 mcg under the tongue daily.    . digoxin (LANOXIN) 0.125 MG tablet Take 0.5 tablets (0.0625 mg total) by mouth daily. 30 tablet 6  . divalproex (DEPAKOTE ER) 250 MG 24 hr tablet TAKE 1 TABLET (250 MG TOTAL) BY MOUTH DAILY. 30 tablet 0  . divalproex (DEPAKOTE ER) 250 MG 24 hr tablet TAKE 1 TABLET (250 MG TOTAL) BY MOUTH DAILY. 30 tablet 0  . GLUCOSAMINE-CHONDROITIN PO Take 1,200 mg by mouth daily.    . hydrOXYzine (ATARAX/VISTARIL) 25 MG tablet Take 25 mg by mouth at bedtime. DOESN'T TAKE WITH TRAZODONE    . loratadine (CLARITIN) 10 MG tablet Take  10 mg by mouth daily.    . montelukast (SINGULAIR) 10 MG tablet TAKE 1 TABLET (10 MG TOTAL) BY MOUTH AT BEDTIME. 30 tablet 0  . Multiple Vitamin (MULTIVITAMIN WITH MINERALS) TABS Take 1 tablet by mouth daily.    Marland Kitchen nystatin (MYCOSTATIN) 100000 UNIT/ML suspension Take 5 mLs (500,000 Units total) by mouth 4 (four) times daily. 60 mL 0  . omeprazole (PRILOSEC) 20 MG capsule Take 1 capsule (20 mg total) by mouth daily. 30 capsule 5  . PATADAY 0.2 % SOLN Place 1 drop into both eyes daily.     . pentosan polysulfate (ELMIRON) 100 MG capsule Take 1 capsule (100 mg total) by mouth 2 (two) times daily. (Patient taking differently: Take 200 mg by mouth daily. ) 60 capsule 3  . potassium chloride SA (K-DUR,KLOR-CON) 20 MEQ tablet Take 1 tablet (20 mEq total) by  mouth 3 (three) times a week. Every Mon, Wed and Fri with torsemide    . spironolactone (ALDACTONE) 25 MG tablet Take 0.5 tablets (12.5 mg total) by mouth daily. 15 tablet 6  . torsemide (DEMADEX) 20 MG tablet Take 1 tablet (20 mg total) by mouth daily. Every Mon, Wed and Fri    . traMADol (ULTRAM) 50 MG tablet Take 50 mg by mouth daily as needed.    . traZODone (DESYREL) 50 MG tablet Take 50 mg by mouth as needed. FOR SLEEP     No facility-administered medications prior to visit.    PAST MEDICAL HISTORY: Past Medical History  Diagnosis Date  . 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 Cardiac MRI 05/2011 EF 32%. ICD implantation 10/2011 ([employer])  . Peripheral neuropathy     chemo- induced  . Hypertension     c/b orthostatic hypotention  . Depression   . Palpitation     normal sinus rhythm only on 21 day heart monitor  . Obesity   . Fibromyalgia   . GERD (gastroesophageal reflux disease)   . Breast calcifications     right breast  . Interstitial cystitis   . Anxiety   . Nonischemic cardiomyopathy     related to chemo; EF 30% 05/2011  . ICD (implantable cardiac defibrillator) in place   . Pneumonia 2000's    "once"  . Type II diabetes mellitus   . Blood transfusion 1980's    1987 or 1988  . Anemia 1980's    [id number]  . History of stomach ulcers   . Breast cancer     right;  . Conversion disorder   . 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   . 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 Laparoscopic endometriosis fulguration  1990's  . Cardiac defibrillator placement  11/03/11  . Tonsillectomy  1980's  . Tubal ligation  1990's  . Vaginal hysterectomy  2000's  .  Cholecystectomy  ~ 2009  . Breast lumpectomy  2008; 2013    right  . Port-a-cath removal  2010?    left chest; placed in 2008  . Nasal sinus surgery    . Implantable cardioverter defibrillator implant N/A 11/03/2011    Procedure: IMPLANTABLE CARDIOVERTER DEFIBRILLATOR IMPLANT;  Surgeon: Deboraha Sprang, MD; MDT     FAMILY HISTORY: Family History  Problem Relation  Age of Onset  . Coronary artery disease    . Heart attack    . Heart disease Mother   . Heart disease Maternal Uncle   . Cancer Paternal Uncle     colon  . Heart disease Maternal Grandmother   . Cancer Paternal Grandmother     ovarian    SOCIAL HISTORY: History   Social History  . Marital Status: Married    Spouse Name: [name]  . Number of Children: 1  . Years of Education: 16   Occupational History  . Disablity    Social History Main Topics  . Smoking status: Never Smoker   . Smokeless tobacco: Never Used  . Alcohol Use: Yes     Comment: 11/03/11 "maybe once a month"  . Drug Use: No  . Sexual Activity: Not on file   Other Topics Concern  . Not on file   Social History Narrative   Patient is married [name]) and lives at home with her family.   Patient has one child.   Patient is right-handed.   Patient has a college education.   Patient drinks some caffeine occasionally, but not everyday.   Regular exercise     PHYSICAL EXAM  There were no vitals filed for this visit. There is no weight on file to calculate BMI.  Generalized: Well developed, in no acute distress  Head: normocephalic and atraumatic,. Oropharynx benign  Neck: Supple, no carotid bruits  Musculoskeletal: No deformity   Neurological examination   Mentation: Alert oriented to time, place, history taking. Attention span and concentration appropriate. Anxious .  Follows all commands speech and language fluent.   Cranial nerve II-XII: Pupils were equal round reactive to light extraocular movements were full, visual field were full  on confrontational test. Facial sensation and strength were normal. hearing was intact to finger rubbing bilaterally. Uvula tongue midline. head turning and shoulder shrug were normal and symmetric.Tongue protrusion into cheek strength was normal. Motor: normal bulk and tone, give way weakness on testing bilateral hand grip good  Sensory: Decreased pinprick and vibratory in upper and lower extremities   Coordination: finger-nose-finger, heel-to-shin bilaterally, no dysmetria Reflexes: Symmetric upper and lower, plantar responses were flexor bilaterally. Gait and Station: Rising up from seated position without assistance, wide based  stance,   DIAGNOSTIC DATA (LABS, IMAGING, TESTING) - I reviewed patient records, labs, notes, testing and imaging myself where available.  Lab Results  Component Value Date   WBC 5.5 11/26/2014   HGB 13.8 11/26/2014   HCT 41.6 11/26/2014   MCV 90.6 11/26/2014   PLT 152 11/26/2014      Component Value Date/Time   NA 136 12/11/2014 1639   NA 141 09/19/2014 1133   K 4.4 12/11/2014 1639   K 3.5 09/19/2014 1133   CL 98* 12/11/2014 1639   CO2 27 12/11/2014 1639   CO2 23 09/19/2014 1133   GLUCOSE 94 12/11/2014 1639   GLUCOSE 103 09/19/2014 1133   BUN 13 12/11/2014 1639   BUN 11.6 09/19/2014 1133   CREATININE 1.10* 12/11/2014 1639   CREATININE 0.9 09/19/2014 1133   CREATININE 0.77 08/25/2012 1706   CALCIUM 9.4 12/11/2014 1639   CALCIUM 8.6 09/19/2014 1133   PROT 7.1 11/27/2014 0330   PROT 7.2 09/19/2014 1133   ALBUMIN 3.2* 11/27/2014 0330   ALBUMIN 3.4* 09/19/2014 1133   AST 31 11/27/2014 0330   AST 21 09/19/2014 1133   ALT 25 11/27/2014 0330   ALT 14 09/19/2014 1133   ALKPHOS  77 11/27/2014 0330   ALKPHOS 87 09/19/2014 1133   BILITOT 2.7* 11/27/2014 0330   BILITOT 1.62* 09/19/2014 1133   GFRNONAA >60 12/11/2014 1639   GFRAA >60 12/11/2014 1639    Lab Results  Component Value Date   TSH 2.678 11/26/2014     ASSESSMENT AND PLAN  44 y.o.  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 And myoclonus here to follow-up.The patient is a current patient of Dr. Brett Fairy  who is out of the office today . This note is sent to the work in doctor.     Increase Depakote to 250 mg twice daily, will refill Appointment next week July 13 10 AM with Dr. Brett Fairy to establish neurologic treatment plan Dennie Bible, [facility], [facility], APRN  [facility] [address], [place], [place] [address] [phone]

## 2014-12-25 ENCOUNTER — Encounter: Payer: Self-pay | Admitting: Neurology

## 2014-12-25 ENCOUNTER — Other Ambulatory Visit: Payer: Self-pay | Admitting: Family

## 2014-12-25 ENCOUNTER — Telehealth: Payer: Self-pay | Admitting: Neurology

## 2014-12-25 ENCOUNTER — Ambulatory Visit (INDEPENDENT_AMBULATORY_CARE_PROVIDER_SITE_OTHER): Payer: Medicare Other | Admitting: Neurology

## 2014-12-25 VITALS — Resp 20 | Ht 67.0 in | Wt 243.0 lb

## 2014-12-25 DIAGNOSIS — F444 Conversion disorder with motor symptom or deficit: Secondary | ICD-10-CM | POA: Insufficient documentation

## 2014-12-25 DIAGNOSIS — G248 Other dystonia: Secondary | ICD-10-CM

## 2014-12-25 DIAGNOSIS — G241 Genetic torsion dystonia: Secondary | ICD-10-CM

## 2014-12-25 DIAGNOSIS — G253 Myoclonus: Secondary | ICD-10-CM

## 2014-12-25 DIAGNOSIS — R569 Unspecified convulsions: Secondary | ICD-10-CM | POA: Insufficient documentation

## 2014-12-25 MED ORDER — PREGABALIN 200 MG PO CAPS: 200.0000 mg | ORAL_CAPSULE | Freq: Two times a day (BID) | ORAL | Status: DC

## 2014-12-25 MED ORDER — CARBIDOPA-LEVODOPA 25-100 MG PO TABS: 1.0000 | ORAL_TABLET | Freq: Three times a day (TID) | ORAL | Status: DC

## 2014-12-25 MED ORDER — DIVALPROEX SODIUM ER 250 MG PO TB24: 250.0000 mg | ORAL_TABLET | Freq: Every day | ORAL | Status: DC

## 2014-12-25 NOTE — Progress Notes (Signed)
[facility]  PATIENT: [name] DOB: 07-Apr-1971   REASON FOR VISIT: Myoclonus  HISTORY FROM: Patient    HISTORY OF PRESENT ILLNESS: [name], 44 year old female returns for follow-up she was last seen in this office by Dr. Vickey Huger 07/25/2013.   At that time she noticed myoclonus during exam. EMG nerve conduction done by Dr. Anne Hahn was normal, repetitive nerve stimulation study was normal. She was placed on Depakote 250 daily. She returns today for refills. In addition she wants treatment for her conversion disorder. She had an admission 11/26/2014 for congestive heart failure. 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 Unable to get her blood pressure by palpation we do not have Doppler. She had another admission for 14th June and was followed by Darrol Angel, NP.  She has been told she has kidney and liver dysfunction. Labs are drawn in the cancer center. Bilirubin increased in June. US of the liver documented a spot , small, non specific, Now pending MRI - BUT she has a defibrillator.  Dr Darnelle Catalan. Decided to repeat CT abdomen. She has abdominal discomfort. Will reduce Depakote to once a day from bid to help liver metabolism.  Referral to [facility].      HISTORY (CD)[name] is a 44 y.o. female Is seen here as a work in patient , after recent sleep study from 9 07-23-13. The patient underwent an expanded sleep study with full EEG and o2 monitoring.  CO2 was monitored for only 70 minutes , due to technical difficulties, no co2 retention was seen in the recorded time. The study found an AHI of 6.2 and hypoxemia for a prolonged period of time.  The patient had no Video activity or seizure activity.  I am concerned about the ongoing dysphonia, and weakness, proximal - and she reports waxing and waning,but is never free of malaise and fatigue and weakness.  She is dry in mouth and eyes, and will need to be evaluated for myopathy  and paraneoplatic symptoms. She had Hemiplegia without stroke on CT and MRI.  During today's evaluation I also noticed myoclonia, she has a hemiparesis seems to drift to the right side of her body is rather rigid she can squeeze but cannot release her hands and repeated activity does not make it easier to release her hands. Her mother does not have any of the symptoms, and she reports no exercise or food intake related myotonia, paratonia.  Last visit :  [name] is a well-established patient in our practice I have followed for over 6 years now. The patient developed breast cancer at a young age, she was diagnosed more or less coincidental after a mammogram was offered at her workplace. She underwent surgery, chemotherapy and radiation. Late effects of her therapies included a cardiomyopathy with reduced ejection fraction -for which he follows regularly with cardiology, a neuropathy, and overall lethargy and malaise.  The patient never felt quite as energetic or rested as she did prior to her cance diagnoses. She also developed diabetes and PICA. She is eating ice cubes and starch, she tested negative for iron deficiency anemia. 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 She still speaks this way today, poorly modulated dysarthic speech today. Brain MRI in 07-2011 , before implanation of her defibrillator , was normal- no extensive WM disease, nor metastatis, nor strokes.  EEG was  normal in February 2013 ,but for benzodiazepine related artefact.  A polysomnography on 08-06-2011 evaluated her for fatigue and EDS, and returned with normal results, her AHI was 0.5/hr. She has a lot of fatigue she does not sleep well and her mother now reported that she has been jerks and jumps.  The way her mother describes it to me today is that  [name] twitches in all 4 extremities at once- sometimes has ophistotonus, the whole body jerking with a pelvic thrust  , and sometimes the movements are repeated so often that she is concerned about her daughter contracting injuries. The spells seem to star mild and increase in amplitude, she is hitting furniture.  She has also had a tongue bite ,chewed her tongue at night which is frequently associated with seizures. She is completely amnestic for the events, and her mother reports these can go on for several minutes, 20- 30 minutes. She remains asleep and does not wake up. At times she yells loudly in her sleep, amnestic for these spells , too.  She has noticed isolated myoclonic jerks in one hand or foot in daytime, mostly in her right , dominant hand.  She is just very tired afterwards. She has not had incontinence during these events.   Interval history from 12-25-14. I, Dr. Porfirio Mylar Aris Moman, me today this [name] to establish a new treatment plan. [name] has developed a speech disorder of Holter in speech at times stuttering and she has daily multiple myoclonic jerks the myoclonus and falls at times whole body. She feels stuck - in movement , speech and thought. She felt not better over the last 3.5 years since i diagnosed her with a conversion disorder and chemotherapy induced cardiomyopathy. i will give her a trial of dopamin and either depakote or lamictal  for myoclonus.   She also has symptoms reminiscent of dystonia, Dystonia characterized as rigidity painful rigor or or spasm spells that can get released by placing a hand or finger on the small of her neck to get out of the spasm and rigor. Mrs. Berish underwent in [place] 2015 and EMG and nerve conduction study that showed no fibrillations or fasciculations.  A sleep study showed an AHI of 6.2 up to keep her on the Depakote but will add a dopamine replacement in form of Sinemet 50 /200 she can takes his medication twice a day  to begin with.    REVIEW OF SYSTEMS: Full 14 system review of systems performed and notable only for those listed, all others are neg:  Constitutional: Fatigue  Cardiovascular: Palpitations murmur Ear/Nose/Throat: Facial swelling, trouble swallowing Skin: neg Eyes: Redness, double vision Respiratory: Wheezing shortness of breath Gastroitestinal: Swollen abdomen, abdominal pain, black stools constipation nausea and vomiting difficulty urinating Hematology/Lymphatic: neg  Endocrine: Intolerance to heat excessive thirst Musculoskeletal: Joint pain back pain walking difficulty Allergy/Immunology: Environmental allergies Neurological: Memory loss, dizziness, headache, seizure, speech difficulty, tremors Psychiatric: Agitation and confusion depression and anxiety Sleep : Daytime sleepiness insomnia ALLERGIES: Allergies  Allergen Reactions  . Reglan [Metoclopramide Hcl] Anxiety  . Adhesive [Tape] Itching and Rash  . Other Itching and Rash    Chloraprep  . Ace Inhibitors     hyperkalemia  . Losartan     hyperkalemia  . Chlorhexidine Itching and Rash    HOME MEDICATIONS: Outpatient Prescriptions Prior to Visit  Medication Sig Dispense Refill  . acetaminophen (TYLENOL) 325 MG tablet Take 2 tablets (650 mg total) by mouth every 4 (four) hours as needed for  headache or mild pain.    Marland Kitchen albuterol (PROAIR HFA) 108 (90 BASE) MCG/ACT inhaler Inhale 1-2 puffs into the lungs every 6 (six) hours as needed for wheezing or shortness of breath. Pt needs appt for more refills 8.5 each 0  . ALPRAZolam (XANAX) 1 MG tablet TAKE 1 TABLET AT BEDTIME 30 tablet 0  . baclofen (LIORESAL) 10 MG tablet TAKE 1 TABLET BY MOUTH 2 (TWO) TIMES DAILY AS NEEDED FOR MUSCLE SPASMS. 60 tablet 0  . beclomethasone (QVAR) 80 MCG/ACT inhaler Inhale 2 puffs into the lungs See admin instructions. TAKES EVERY MORNING AND SOMETIMES WHEN NEEDED IN EVENINGS    . carvedilol (COREG) 25 MG tablet Take 0.5 tablets (12.5 mg total) by  mouth 2 (two) times daily with a meal. 60 tablet 6  . Cholecalciferol (VITAMIN D3) 5000 UNITS CAPS Take 1 capsule by mouth daily.     . Coenzyme Q10 (CO Q-10) 100 MG CAPS Take 100 mg by mouth daily.    . Cyanocobalamin 2500 MCG SUBL Place 2,500 mcg under the tongue daily.    . digoxin (LANOXIN) 0.125 MG tablet Take 0.5 tablets (0.0625 mg total) by mouth daily. 30 tablet 6  . divalproex (DEPAKOTE ER) 250 MG 24 hr tablet Take 1 tablet (250 mg total) by mouth 2 (two) times daily. 60 tablet 2  . GLUCOSAMINE-CHONDROITIN PO Take 1,200 mg by mouth daily.    . hydrOXYzine (ATARAX/VISTARIL) 25 MG tablet Take 25 mg by mouth at bedtime. DOESN'T TAKE WITH TRAZODONE    . loratadine (CLARITIN) 10 MG tablet Take 10 mg by mouth daily.    . montelukast (SINGULAIR) 10 MG tablet TAKE 1 TABLET (10 MG TOTAL) BY MOUTH AT BEDTIME. 30 tablet 0  . Multiple Vitamin (MULTIVITAMIN WITH MINERALS) TABS Take 1 tablet by mouth daily.    Marland Kitchen nystatin (MYCOSTATIN) 100000 UNIT/ML suspension Take 5 mLs (500,000 Units total) by mouth 4 (four) times daily. 60 mL 0  . omeprazole (PRILOSEC) 20 MG capsule TAKE 1 CAPSULE (20 MG TOTAL) BY MOUTH DAILY. 30 capsule 5  . PATADAY 0.2 % SOLN Place 1 drop into both eyes daily.     . pentosan polysulfate (ELMIRON) 100 MG capsule Take 1 capsule (100 mg total) by mouth 2 (two) times daily. (Patient taking differently: Take 200 mg by mouth daily. ) 60 capsule 3  . potassium chloride SA (K-DUR,KLOR-CON) 20 MEQ tablet Take 1 tablet (20 mEq total) by mouth 3 (three) times a week. Every Mon, Wed and Fri with torsemide    . spironolactone (ALDACTONE) 25 MG tablet Take 0.5 tablets (12.5 mg total) by mouth daily. 15 tablet 6  . torsemide (DEMADEX) 20 MG tablet Take 1 tablet (20 mg total) by mouth daily. Every Mon, Wed and Fri    . traMADol (ULTRAM) 50 MG tablet Take 50 mg by mouth daily as needed.    . traZODone (DESYREL) 50 MG tablet Take 50 mg by mouth as needed. FOR SLEEP     No facility-administered  medications prior to visit.    PAST MEDICAL HISTORY: Past Medical History  Diagnosis Date  . 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 Cardiac MRI 05/2011 EF 32%. ICD implantation 10/2011 ([employer])  . Peripheral neuropathy     chemo- induced  . Hypertension     c/b orthostatic hypotention  . Depression   . Palpitation     normal sinus rhythm only  on 21 day heart monitor  . Obesity   . Fibromyalgia   . GERD (gastroesophageal reflux disease)   . Breast calcifications     right breast  . Interstitial cystitis   . Anxiety   . Nonischemic cardiomyopathy     related to chemo; EF 30% 05/2011  . ICD (implantable cardiac defibrillator) in place   . Pneumonia 2000's    "once"  . Type II diabetes mellitus   . Blood transfusion 1980's    1987 or 1988  . Anemia 1980's    [id number]  . History of stomach ulcers   . Breast cancer     right;  . Conversion disorder   . 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   . 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 Laparoscopic endometriosis fulguration  1990's  . Cardiac defibrillator placement  11/03/11  . Tonsillectomy  1980's  . Tubal ligation  1990's  . Vaginal hysterectomy  2000's  . Cholecystectomy  ~ 2009  . Breast lumpectomy  2008; 2013    right  . Port-a-cath removal  2010?    left chest; placed in 2008  . Nasal sinus surgery    . Implantable cardioverter defibrillator implant N/A 11/03/2011    Procedure: IMPLANTABLE CARDIOVERTER DEFIBRILLATOR IMPLANT;  Surgeon: Duke Salvia, MD; MDT     FAMILY HISTORY: Family History  Problem Relation Age of Onset  . Coronary artery disease    . Heart attack    . Heart disease Mother   .  Heart disease Maternal Uncle   . Cancer Paternal Uncle     colon  . Heart disease Maternal Grandmother   . Cancer Paternal Grandmother     ovarian    SOCIAL HISTORY: History   Social History  . Marital Status: Married    Spouse Name: Shirlee Limerick  . Number of Children: 1  . Years of Education: 16   Occupational History  . Disablity    Social History Main Topics  . Smoking status: Never Smoker   . Smokeless tobacco: Never Used  . Alcohol Use: Yes     Comment: 11/03/11 "maybe once a month"  . Drug Use: No  . Sexual Activity: Not on file   Other Topics Concern  . Not on file   Social History Narrative   Patient is married Shirlee Limerick) and lives at home with her family.   Patient has one child.   Patient is right-handed.   Patient has a college education.   Patient drinks some caffeine occasionally, but not everyday.   Regular exercise     PHYSICAL EXAM  Filed Vitals:   12/25/14 1023  Resp: 20  Height: 5\' 7"  (1.702 m)  Weight: 243 lb (110.224 kg)   Body mass index is 38.05 kg/(m^2).  Generalized: Well developed, in no acute distress  Head: normocephalic and atraumatic,. Oropharynx benign  Neck: Supple, no carotid bruits  Musculoskeletal: No deformity   Neurological examination   Mentation: Alert oriented to time, place, history taking. Attention span and concentration appropriate. Anxious .  Follows all commands speech and language fluent.   Cranial nerve II-XII: Pupils were equal round reactive to light extraocular movements were full, visual field were full on confrontational test. Facial sensation and strength were normal. hearing was intact to finger rubbing bilaterally. Uvula tongue midline.  head turning and shoulder shrug were normal and symmetric.Tongue protrusion into cheek strength was normal. Motor: normal bulk and tone, give way weakness on testing bilateral hand grip good  Sensory: Decreased pinprick and vibratory in upper and lower extremities     Coordination: finger-nose-finger, heel-to-shin bilaterally, no dysmetria Reflexes: Symmetric upper and lower, plantar responses were flexor bilaterally. Gait and Station: Rising up from seated position without assistance, wide based stance, she arrested in motion after 50 feet. Drifts to the right- no external factors noted.     DIAGNOSTIC DATA (LABS, IMAGING, TESTING) - I reviewed patient records, labs, notes, testing and imaging myself where available.  Lab Results  Component Value Date   WBC 5.5 11/26/2014   HGB 13.8 11/26/2014   HCT 41.6 11/26/2014   MCV 90.6 11/26/2014   PLT 152 11/26/2014      Component Value Date/Time   NA 136 12/11/2014 1639   NA 141 09/19/2014 1133   K 4.4 12/11/2014 1639   K 3.5 09/19/2014 1133   CL 98* 12/11/2014 1639   CO2 27 12/11/2014 1639   CO2 23 09/19/2014 1133   GLUCOSE 94 12/11/2014 1639   GLUCOSE 103 09/19/2014 1133   BUN 13 12/11/2014 1639   BUN 11.6 09/19/2014 1133   CREATININE 1.10* 12/11/2014 1639   CREATININE 0.9 09/19/2014 1133   CREATININE 0.77 08/25/2012 1706   CALCIUM 9.4 12/11/2014 1639   CALCIUM 8.6 09/19/2014 1133   PROT 7.1 11/27/2014 0330   PROT 7.2 09/19/2014 1133   ALBUMIN 3.2* 11/27/2014 0330   ALBUMIN 3.4* 09/19/2014 1133   AST 31 11/27/2014 0330   AST 21 09/19/2014 1133   ALT 25 11/27/2014 0330   ALT 14 09/19/2014 1133   ALKPHOS 77 11/27/2014 0330   ALKPHOS 87 09/19/2014 1133   BILITOT 2.7* 11/27/2014 0330   BILITOT 1.62* 09/19/2014 1133   GFRNONAA >60 12/11/2014 1639   GFRAA >60 12/11/2014 1639    Lab Results  Component Value Date   TSH 2.678 11/26/2014     ASSESSMENT AND PLAN  44 y.o. year old female  has a past medical history of CHF (congestive heart failure); Peripheral neuropathy; Hypertension; Depression; Palpitation; Obesity; Fibromyalgia;  Interstitial cystitis; Anxiety; Nonischemic cardiomyopathy; ICD (implantable cardiac defibrillator) in place;  Type II diabetes mellitus;    Breast  cancer; Conversion disorder; COPD with asthma (11/15/2012);  Neuropathy due to drug (04/27/2013); Ataxia (04/27/2013); and Cognitive and neurobehavioral dysfunction (04/27/2013). And myoclonus here to follow-up.The patient is a current patient of Dr. Vickey Huger   Increase Depakote to 250 mg twice daily, will refill. 25-100 mg sinemet , up to tid po, start with 2 in AM.  My Appointment today July 13 10 AM was meant  to establish neurologic treatment plan.    [facility] [address], [address] [place], [place] [address] [phone]

## 2014-12-25 NOTE — Telephone Encounter (Signed)
Pt calling back about medication on previous note. She missed the call back. Please call and advise

## 2014-12-25 NOTE — Patient Instructions (Signed)
Carbidopa; Levodopa tablets What is this medicine? CARBIDOPA;LEVODOPA (kar bi DOE pa; lee voe DOE pa) is used to treat the symptoms of Parkinson's disease. This medicine may be used for other purposes; ask your health care provider or pharmacist if you have questions. COMMON BRAND NAME(S): Atamet, SINEMET What should I tell my health care provider before I take this medicine? They need to know if you have any of these conditions: -asthma or lung disease -depression or other mental illness -diabetes -glaucoma -heart disease, including history of a heart attack -irregular heart beat -kidney or liver disease -melanoma or suspicious skin lesions -stomach or intestine ulcers -an unusual or allergic reaction to levodopa, carbidopa, other medicines, foods, dyes, or preservatives -pregnant or trying to get pregnant -breast-feeding How should I use this medicine? Take this medicine by mouth with a glass of water. Follow the directions on the prescription label. Take your doses at regular intervals. Do not take your medicine more often than directed. Do not stop taking except on the advice of your doctor or health care professional. Talk to your pediatrician regarding the use of this medicine in children. Special care may be needed. Overdosage: If you think you have taken too much of this medicine contact a poison control center or emergency room at once. NOTE: This medicine is only for you. Do not share this medicine with others. What if I miss a dose? If you miss a dose, take it as soon as you can. If it is almost time for your next dose, take only that dose. Do not take double or extra doses. What may interact with this medicine? Do not take this medicine with any of the following medications: -MAOIs like Marplan, Nardil, and Parnate -reserpine -tetrabenazine This medicine may also interact with the following medications: -alcohol -droperidol -entacapone -iron supplements or multivitamins  with iron -isoniazid, INH -linezolid -medicines for depression, anxiety, or psychotic disturbances -medicines for high blood pressure -medicines for sleep -metoclopramide -papaverine -procarbazine -tedizolid -rasagiline -selegiline -tolcapone This list may not describe all possible interactions. Give your health care provider a list of all the medicines, herbs, non-prescription drugs, or dietary supplements you use. Also tell them if you smoke, drink alcohol, or use illegal drugs. Some items may interact with your medicine. What should I watch for while using this medicine? Visit your doctor or health care professional for regular checks on your progress. It may be several weeks or months before you feel the full benefits of this medicine. Continue to take your medicine on a regular schedule. Do not take any additional medicines for Parkinson's disease without first consulting with your health care provider. You may experience a wearing of effect prior to the time for your next dose of this medicine. You may also experience an on-off effect where the medicine apparently stops working for anything from a minute to several hours, then suddenly starts working again. Tell your doctor or health care professional if any of these symptoms happen to you. Your dose may need to be changed. A high protein diet can slow or prevent absorption of this medicine. Avoid high protein foods near the time of taking this medicine to help to prevent these problems. Take this medicine at least 30 minutes before eating or one hour after meals. You may want to eat higher protein foods later in the day or in small amounts. Discuss your diet with your doctor or health care professional or nutritionist. You may get drowsy or dizzy. Do not drive, use machinery,  or do anything that needs mental alertness until you know how this drug affects you. Do not stand or sit up quickly, especially if you are an older patient. This  reduces the risk of dizzy or fainting spells. Alcohol can make you more drowsy and dizzy. Avoid alcoholic drinks. If you find that you have sudden feelings of wanting to sleep during normal activities, like cooking, watching television, or while driving or riding in a car, you should contact your health care professional. If you are diabetic, this medicine may interfere with the accuracy of some tests for sugar or ketones in the urine (does not interfere with blood tests). Check with your doctor or health care professional before changing the dose of your diabetic medicine. This medicine may discolor the urine or sweat, making it look darker or red in color. This is of no cause for concern. However, this may stain clothing or fabrics. There have been reports of increased sexual urges or other strong urges such as gambling while taking some medicines for Parkinson's disease. If you experience any of these urges while taking this medicine, you should report it to your health care provider as soon as possible. You should check your skin often for changes to moles and new growths while taking this medicine. Call your doctor if you notice any of these changes. What side effects may I notice from receiving this medicine? Side effects that you should report to your doctor or health care professional as soon as possible: -allergic reactions like skin rash, itching or hives, swelling of the face, lips, or tongue -anxiety, confusion, or nervousness -falling asleep during normal activities like driving -fast, irregular heartbeat -hallucination, loss of contact with reality -mood changes like aggressive behavior, depression -stomach pain -trouble passing urine -uncontrolled movements of the mouth, head, hands, feet, shoulders, eyelids or other unusual muscle movements Side effects that usually do not require medical attention (report to your doctor or health care professional if they continue or are  bothersome): -headache -loss of appetite -muscle twitches -nausea, vomiting -nightmares, trouble sleeping -unusually weak or tired This list may not describe all possible side effects. Call your doctor for medical advice about side effects. You may report side effects to FDA at 1-800-FDA-1088. Where should I keep my medicine? Keep out of the reach of children. Store at room temperature between 15 and 30 degrees C (59 and 86 degrees F). Protect from light. Throw away any unused medicine after the expiration date. NOTE: This sheet is a summary. It may not cover all possible information. If you have questions about this medicine, talk to your doctor, pharmacist, or health care provider.  2015, [employer]vier/[employer] Standard. (2013-07-31 15:41:53)

## 2014-12-25 NOTE — Telephone Encounter (Signed)
Patient called stating she Dr Brett Fairy gave her RX for Lyrica 100mg  bid but pt states she wants to stay on 200mg  day bid. Please call and advise. Patient can be reached at [phone].

## 2014-12-25 NOTE — Telephone Encounter (Signed)
By viewing OV note, Sinemet and Depakote were prescribed today.  It does not appear Lyrica is on current med list.  I called back to clarify.  Got no answer.  Left message.

## 2014-12-25 NOTE — Telephone Encounter (Signed)
I called back again.  Spoke with patient.  Says she would like Dr Brett Fairy to prescribe Lyrica 200mg  capsules one twice daily.  Please advise.  Thank you.

## 2014-12-26 ENCOUNTER — Other Ambulatory Visit: Payer: Self-pay

## 2014-12-26 ENCOUNTER — Encounter: Payer: Self-pay | Admitting: Internal Medicine

## 2014-12-26 ENCOUNTER — Telehealth: Payer: Self-pay | Admitting: Neurology

## 2014-12-26 DIAGNOSIS — F444 Conversion disorder with motor symptom or deficit: Secondary | ICD-10-CM

## 2014-12-26 DIAGNOSIS — G253 Myoclonus: Secondary | ICD-10-CM

## 2014-12-26 DIAGNOSIS — G241 Genetic torsion dystonia: Secondary | ICD-10-CM

## 2014-12-26 DIAGNOSIS — R569 Unspecified convulsions: Secondary | ICD-10-CM

## 2014-12-26 NOTE — Telephone Encounter (Signed)
Patient called requesting when referral to Dr Collene Mares will be generated. Please call and advise. Patient can be reached at [phone].

## 2014-12-26 NOTE — Addendum Note (Signed)
Addended by: Lester Humacao A on: 12/26/2014 05:08 PM   Modules accepted: Orders

## 2014-12-26 NOTE — Telephone Encounter (Signed)
error 

## 2014-12-26 NOTE — Telephone Encounter (Signed)
Spoke to [name] and informed her that we are working on her referral to a gastroenterologist. She says that she only wants to see Dr. Juanita Craver because she is a female. I changed the referral order to request this physician for [name], and informed her that we will work on it and their office will call her, if she is accepted as a new patient. [name] verbalized understanding.

## 2014-12-28 ENCOUNTER — Other Ambulatory Visit: Payer: Self-pay | Admitting: Neurology

## 2014-12-28 ENCOUNTER — Other Ambulatory Visit: Payer: Self-pay | Admitting: Internal Medicine

## 2015-01-01 ENCOUNTER — Encounter: Payer: Self-pay | Admitting: Internal Medicine

## 2015-01-06 ENCOUNTER — Other Ambulatory Visit: Payer: Self-pay | Admitting: Family

## 2015-01-06 DIAGNOSIS — S0502XA Injury of conjunctiva and corneal abrasion without foreign body, left eye, initial encounter: Secondary | ICD-10-CM | POA: Diagnosis not present

## 2015-01-07 DIAGNOSIS — R748 Abnormal levels of other serum enzymes: Secondary | ICD-10-CM | POA: Diagnosis not present

## 2015-01-07 DIAGNOSIS — K219 Gastro-esophageal reflux disease without esophagitis: Secondary | ICD-10-CM | POA: Diagnosis not present

## 2015-01-07 DIAGNOSIS — K580 Irritable bowel syndrome with diarrhea: Secondary | ICD-10-CM | POA: Diagnosis not present

## 2015-01-07 DIAGNOSIS — Z1211 Encounter for screening for malignant neoplasm of colon: Secondary | ICD-10-CM | POA: Diagnosis not present

## 2015-01-14 ENCOUNTER — Other Ambulatory Visit (HOSPITAL_COMMUNITY): Payer: Self-pay | Admitting: Adult Health

## 2015-01-29 ENCOUNTER — Telehealth: Payer: Self-pay | Admitting: Family

## 2015-01-29 NOTE — Telephone Encounter (Signed)
Hassan Rowan witherspoon is calling to request a referral to [facility]. There is not a specific provider. She requests that you give her a call

## 2015-01-30 NOTE — Telephone Encounter (Signed)
Do we have a liver clinic?

## 2015-01-30 NOTE — Telephone Encounter (Signed)
Spoke with mother regarding need for liver specialist. Indicates that patient was seen by gastroenterology and they "ignored" her problem and blood work that was done at the [facility] showed that there were issues with her liver. We will work to obtain the referral information, but mother informed that additional information, blood work and an office visit may be necessary.

## 2015-01-30 NOTE — Telephone Encounter (Signed)
pts mother Hassan Rowan has another question to ask. Can you please call her back

## 2015-01-30 NOTE — Telephone Encounter (Signed)
See encounter below.  

## 2015-02-03 ENCOUNTER — Ambulatory Visit: Payer: Self-pay | Admitting: Gastroenterology

## 2015-02-03 ENCOUNTER — Telehealth: Payer: Self-pay | Admitting: Family

## 2015-02-03 NOTE — Telephone Encounter (Signed)
Pt mother called in and wants to know if you received info about referring her to liver Dr ?  Charlyn Minerva not 100% clear as to what she is saying or needing.     Best number mother [phone]

## 2015-02-03 NOTE — Telephone Encounter (Signed)
Working on getting the forms.

## 2015-02-06 NOTE — Telephone Encounter (Signed)
pts mother called and she is asking about a liver referral. Her number is [phone]

## 2015-02-06 NOTE — Telephone Encounter (Signed)
Spoke with mom to let her know that the liver clinic referral has been sent.

## 2015-02-12 ENCOUNTER — Other Ambulatory Visit (HOSPITAL_COMMUNITY): Payer: Self-pay | Admitting: Internal Medicine

## 2015-02-18 DIAGNOSIS — R7989 Other specified abnormal findings of blood chemistry: Secondary | ICD-10-CM | POA: Diagnosis not present

## 2015-02-18 LAB — HEPATIC FUNCTION PANEL
ALT: 22 U/L (ref 7–35)
AST: 29 U/L (ref 13–35)
Alkaline Phosphatase: 90 U/L (ref 25–125)
BILIRUBIN DIRECT: 1.5 mg/dL — AB
Bilirubin, Total: 2.7 mg/dL

## 2015-02-18 LAB — BASIC METABOLIC PANEL
BUN: 32 mg/dL — AB (ref 4–21)
Creatinine: 1.4 mg/dL — AB (ref <=1.1)
GLUCOSE: 89 mg/dL
Potassium: 4.4 mmol/L (ref 3.4–5.3)
Sodium: 136 mmol/L — AB (ref 137–147)

## 2015-02-24 ENCOUNTER — Other Ambulatory Visit: Payer: Self-pay | Admitting: Internal Medicine

## 2015-02-24 ENCOUNTER — Other Ambulatory Visit: Payer: Self-pay | Admitting: Family

## 2015-02-25 ENCOUNTER — Ambulatory Visit (INDEPENDENT_AMBULATORY_CARE_PROVIDER_SITE_OTHER): Payer: Medicare Other | Admitting: *Deleted

## 2015-02-25 ENCOUNTER — Other Ambulatory Visit (HOSPITAL_COMMUNITY): Payer: Self-pay | Admitting: *Deleted

## 2015-02-25 ENCOUNTER — Encounter: Payer: Self-pay | Admitting: Family

## 2015-02-25 DIAGNOSIS — I428 Other cardiomyopathies: Secondary | ICD-10-CM

## 2015-02-25 DIAGNOSIS — I429 Cardiomyopathy, unspecified: Secondary | ICD-10-CM | POA: Diagnosis not present

## 2015-02-25 DIAGNOSIS — I5022 Chronic systolic (congestive) heart failure: Secondary | ICD-10-CM | POA: Diagnosis not present

## 2015-02-25 MED ORDER — TORSEMIDE 20 MG PO TABS: 20.0000 mg | ORAL_TABLET | Freq: Every day | ORAL | Status: DC

## 2015-02-25 NOTE — Progress Notes (Signed)
Remote ICD transmission.   

## 2015-02-26 ENCOUNTER — Other Ambulatory Visit (HOSPITAL_COMMUNITY): Payer: Self-pay | Admitting: *Deleted

## 2015-02-26 MED ORDER — METOLAZONE 2.5 MG PO TABS: 2.5000 mg | ORAL_TABLET | ORAL | Status: DC | PRN

## 2015-02-26 NOTE — Telephone Encounter (Signed)
Metolazone 2.5 mg as needed for weight gain ordered and verbal ok by Bensimhon Refilled Torsemide 20 mg daily.

## 2015-02-27 ENCOUNTER — Other Ambulatory Visit: Payer: Self-pay | Admitting: Family

## 2015-03-05 ENCOUNTER — Ambulatory Visit: Payer: Medicare Other | Admitting: Neurology

## 2015-03-06 LAB — CUP PACEART REMOTE DEVICE CHECK
Battery Voltage: 3.13 V
HighPow Impedance: 399 Ohm
HighPow Impedance: 68 Ohm
Lead Channel Impedance Value: 399 Ohm
Lead Channel Pacing Threshold Amplitude: 1.125 V
Lead Channel Sensing Intrinsic Amplitude: 18.125 mV
Lead Channel Setting Pacing Amplitude: 2.25 V
Lead Channel Setting Pacing Pulse Width: 0.4 ms
MDC IDC MSMT LEADCHNL RV PACING THRESHOLD PULSEWIDTH: 0.4 ms
MDC IDC MSMT LEADCHNL RV SENSING INTR AMPL: 18.125 mV
MDC IDC SESS DTM: 20160913073528
MDC IDC SET LEADCHNL RV SENSING SENSITIVITY: 0.3 mV
MDC IDC SET ZONE DETECTION INTERVAL: 400 ms
MDC IDC STAT BRADY RV PERCENT PACED: 0 %
Zone Setting Detection Interval: 250 ms
Zone Setting Detection Interval: 300 ms

## 2015-03-27 ENCOUNTER — Encounter: Payer: Self-pay | Admitting: Cardiology

## 2015-04-01 ENCOUNTER — Other Ambulatory Visit: Payer: Self-pay | Admitting: Nurse Practitioner

## 2015-04-01 ENCOUNTER — Ambulatory Visit (HOSPITAL_COMMUNITY)
Admission: RE | Admit: 2015-04-01 | Discharge: 2015-04-01 | Disposition: A | Discharge status: Home / Self Care | Payer: Medicare Other | Source: Ambulatory Visit | Attending: Nurse Practitioner | Admitting: Nurse Practitioner

## 2015-04-01 ENCOUNTER — Encounter (HOSPITAL_COMMUNITY): Payer: Self-pay

## 2015-04-01 DIAGNOSIS — Z09 Encounter for follow-up examination after completed treatment for conditions other than malignant neoplasm: Secondary | ICD-10-CM | POA: Insufficient documentation

## 2015-04-01 DIAGNOSIS — K760 Fatty (change of) liver, not elsewhere classified: Secondary | ICD-10-CM | POA: Insufficient documentation

## 2015-04-01 DIAGNOSIS — R17 Unspecified jaundice: Secondary | ICD-10-CM

## 2015-04-01 DIAGNOSIS — K769 Liver disease, unspecified: Secondary | ICD-10-CM | POA: Insufficient documentation

## 2015-04-01 DIAGNOSIS — K7689 Other specified diseases of liver: Secondary | ICD-10-CM | POA: Diagnosis not present

## 2015-04-01 MED ORDER — IOHEXOL 300 MG/ML  SOLN
100.0000 mL | Freq: Once | INTRAMUSCULAR | Status: AC | PRN
  Administered 2015-04-01: 100 mL via INTRAVENOUS

## 2015-04-02 ENCOUNTER — Emergency Department (HOSPITAL_COMMUNITY): Payer: Medicare Other

## 2015-04-02 ENCOUNTER — Emergency Department (HOSPITAL_COMMUNITY)
Admission: EM | Admit: 2015-04-02 | Discharge: 2015-04-02 | Disposition: A | Discharge status: Home / Self Care | Payer: Medicare Other | Attending: Emergency Medicine | Admitting: Emergency Medicine

## 2015-04-02 ENCOUNTER — Encounter (HOSPITAL_COMMUNITY): Payer: Self-pay

## 2015-04-02 DIAGNOSIS — F329 Major depressive disorder, single episode, unspecified: Secondary | ICD-10-CM | POA: Diagnosis not present

## 2015-04-02 DIAGNOSIS — K219 Gastro-esophageal reflux disease without esophagitis: Secondary | ICD-10-CM | POA: Insufficient documentation

## 2015-04-02 DIAGNOSIS — M545 Low back pain: Secondary | ICD-10-CM | POA: Diagnosis not present

## 2015-04-02 DIAGNOSIS — M25551 Pain in right hip: Secondary | ICD-10-CM | POA: Diagnosis not present

## 2015-04-02 DIAGNOSIS — E669 Obesity, unspecified: Secondary | ICD-10-CM | POA: Insufficient documentation

## 2015-04-02 DIAGNOSIS — Y9389 Activity, other specified: Secondary | ICD-10-CM | POA: Insufficient documentation

## 2015-04-02 DIAGNOSIS — Z8739 Personal history of other diseases of the musculoskeletal system and connective tissue: Secondary | ICD-10-CM | POA: Diagnosis not present

## 2015-04-02 DIAGNOSIS — Z8701 Personal history of pneumonia (recurrent): Secondary | ICD-10-CM | POA: Diagnosis not present

## 2015-04-02 DIAGNOSIS — M25561 Pain in right knee: Secondary | ICD-10-CM | POA: Diagnosis not present

## 2015-04-02 DIAGNOSIS — Z87448 Personal history of other diseases of urinary system: Secondary | ICD-10-CM | POA: Insufficient documentation

## 2015-04-02 DIAGNOSIS — Y998 Other external cause status: Secondary | ICD-10-CM | POA: Insufficient documentation

## 2015-04-02 DIAGNOSIS — M533 Sacrococcygeal disorders, not elsewhere classified: Secondary | ICD-10-CM | POA: Diagnosis not present

## 2015-04-02 DIAGNOSIS — Z79899 Other long term (current) drug therapy: Secondary | ICD-10-CM | POA: Diagnosis not present

## 2015-04-02 DIAGNOSIS — J441 Chronic obstructive pulmonary disease with (acute) exacerbation: Secondary | ICD-10-CM | POA: Insufficient documentation

## 2015-04-02 DIAGNOSIS — W1839XA Other fall on same level, initial encounter: Secondary | ICD-10-CM | POA: Diagnosis not present

## 2015-04-02 DIAGNOSIS — F419 Anxiety disorder, unspecified: Secondary | ICD-10-CM | POA: Diagnosis not present

## 2015-04-02 DIAGNOSIS — S8991XA Unspecified injury of right lower leg, initial encounter: Secondary | ICD-10-CM | POA: Insufficient documentation

## 2015-04-02 DIAGNOSIS — I10 Essential (primary) hypertension: Secondary | ICD-10-CM | POA: Insufficient documentation

## 2015-04-02 DIAGNOSIS — D649 Anemia, unspecified: Secondary | ICD-10-CM | POA: Diagnosis not present

## 2015-04-02 DIAGNOSIS — M25461 Effusion, right knee: Secondary | ICD-10-CM | POA: Diagnosis not present

## 2015-04-02 DIAGNOSIS — S3992XA Unspecified injury of lower back, initial encounter: Secondary | ICD-10-CM | POA: Diagnosis not present

## 2015-04-02 DIAGNOSIS — Y9289 Other specified places as the place of occurrence of the external cause: Secondary | ICD-10-CM | POA: Insufficient documentation

## 2015-04-02 DIAGNOSIS — E119 Type 2 diabetes mellitus without complications: Secondary | ICD-10-CM | POA: Diagnosis not present

## 2015-04-02 DIAGNOSIS — I509 Heart failure, unspecified: Secondary | ICD-10-CM | POA: Diagnosis not present

## 2015-04-02 DIAGNOSIS — G8929 Other chronic pain: Secondary | ICD-10-CM | POA: Insufficient documentation

## 2015-04-02 DIAGNOSIS — Z95810 Presence of automatic (implantable) cardiac defibrillator: Secondary | ICD-10-CM | POA: Insufficient documentation

## 2015-04-02 MED ORDER — ACETAMINOPHEN 325 MG PO TABS
650.0000 mg | ORAL_TABLET | Freq: Once | ORAL | Status: AC
  Administered 2015-04-02: 650 mg via ORAL
  Filled 2015-04-02: qty 2

## 2015-04-02 NOTE — ED Notes (Addendum)
Pt c/o R knee pain and sacrum/tailbone pain after a couple recent falls.  Pain score 10/10.  Pt reports taking ibuprofen w/o relief.  Pt reports Hx of neuropathy and sts it is the cause of her falls.  Swelling noted.

## 2015-04-02 NOTE — ED Provider Notes (Signed)
CSN: [id number]     Arrival date & time 04/02/15  1208 History   First MD Initiated Contact with Patient 04/02/15 1500     Chief Complaint  Patient presents with  . Fall  . Knee Injury  . Tailbone Pain   HPI  Ms. Rix is a 44 year old female with PMHx of CHF, breast cancer with chemo induced peripheral neuropathy and nonischemic cardiomyopathy, DM2 and fibromyalgia presenting with knee and tailbone pain s/p fall. Pt states that she falls frequently due to her peripheral neuropathy. She states that approximately 10 days ago she had 2 falls. She states that she stumbled and fell forwards onto her knees and then fell backwards onto her buttocks. She states that she felt "good enough" after the fall and decided not to come to the ED. She was ambulatory after the fall but noted gradual increase in right knee pain and tailbone pain. She also notes that her right knee was much more swollen than her left but this has started to resolve currently. Pt states the pain in her right knee is a deep ache and increases with weight bearing and movement. She has had to use a cane to ambulate around her home. She has used a heating pad and ibuprofen with no relief. She also reports that she has stabbing tailbone pain that occurs with movement or sitting. Denies alleviating factors of tailbone pain. Denies bowel/bladder incontinence, numbness, tingling or weakness in the legs. Denies fevers, chills, headache, dizziness, chest pain, abdominal pain, nausea, vomiting or wounds sustained in the fall. Endorses chronic orthopnea from CHF and chronic back pain.   Past Medical History  Diagnosis Date  . CHF (congestive heart failure) (HCC)     due to non-ischemic cardiomyopathy, thought to be chemotherapy induced;  cath 7/12: normal cors, EF 20-25%. Cardiac MRI 05/2011 EF 32%. ICD implantation 10/2011 ([employer])  . Peripheral neuropathy (HCC)     chemo- induced  . Hypertension     c/b orthostatic hypotention  . Depression    . Palpitation     normal sinus rhythm only on 21 day heart monitor  . Obesity   . Fibromyalgia   . GERD (gastroesophageal reflux disease)   . Breast calcifications     right breast  . Interstitial cystitis   . Anxiety   . Nonischemic cardiomyopathy ([place])     related to chemo; EF 30% 05/2011  . ICD (implantable cardiac defibrillator) in place   . Pneumonia 2000's    "once"  . Type II diabetes mellitus ([place])   . Blood transfusion 1980's    1987 or 1988  . Anemia 1980's    [id number]  . History of stomach ulcers   . Breast cancer ([place])     right;  . Conversion disorder   . 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   . 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 Laparoscopic endometriosis fulguration  1990's  . Cardiac defibrillator placement  11/03/11  . Tonsillectomy  1980's  . Tubal ligation  1990's  . Vaginal hysterectomy  2000's  . Cholecystectomy  ~ 2009  . Breast lumpectomy  2008; 2013    right  . Port-a-cath removal  2010?    left chest; placed in 2008  . Nasal sinus surgery    . Implantable cardioverter defibrillator implant N/A 11/03/2011  Procedure: IMPLANTABLE CARDIOVERTER DEFIBRILLATOR IMPLANT;  Surgeon: Deboraha Sprang, MD; MDT    Family History  Problem Relation Age of Onset  . Coronary artery disease    . Heart attack    . Heart disease Mother   . Heart disease Maternal Uncle   . Cancer Paternal Uncle     colon  . Heart disease Maternal Grandmother   . Cancer Paternal Grandmother     ovarian   Social History  Substance Use Topics  . Smoking status: Never Smoker   . Smokeless tobacco: Never Used  . Alcohol Use: Yes     Comment: 11/03/11 "maybe once a month"   OB History    No data available     Review of Systems  Constitutional: Negative for fever and  chills.  Respiratory: Positive for shortness of breath.   Cardiovascular: Negative for chest pain.  Gastrointestinal: Negative for nausea, vomiting and abdominal pain.  Musculoskeletal: Positive for back pain (chronic), joint swelling, arthralgias and gait problem. Negative for neck pain.  Skin: Negative for wound.  Neurological: Positive for numbness (chronic peripheral neuropathy). Negative for dizziness, syncope, weakness and headaches.  All other systems reviewed and are negative.     Allergies  Reglan; Adhesive; Other; Ace inhibitors; Losartan; and Chlorhexidine  Home Medications   Prior to Admission medications   Medication Sig Start Date End Date Taking? Authorizing Provider  acetaminophen (TYLENOL) 325 MG tablet Take 2 tablets (650 mg total) by mouth every 4 (four) hours as needed for headache or mild pain. 11/27/14  Yes Luke K Kilroy, PA-C  albuterol (PROAIR HFA) 108 (90 BASE) MCG/ACT inhaler Inhale 1-2 puffs into the lungs every 6 (six) hours as needed for wheezing or shortness of breath. Pt needs appt for more refills   Yes Chesley Mires, MD  ALPRAZolam (XANAX) 1 MG tablet TAKE 1 TABLET AT BEDTIME Patient taking differently: TAKE 1 TABLET AT BEDTIME PRN SLEEP 12/05/14  Yes Golden Circle, FNP  baclofen (LIORESAL) 10 MG tablet TAKE 1 TABLET BY MOUTH 2 (TWO) TIMES DAILY AS NEEDED FOR MUSCLE SPASMS. 02/26/15  Yes Golden Circle, FNP  beclomethasone (QVAR) 80 MCG/ACT inhaler Inhale 2 puffs into the lungs 2 (two) times daily.    Yes Historical Provider, MD  carvedilol (COREG) 25 MG tablet Take 0.5 tablets (12.5 mg total) by mouth 2 (two) times daily with a meal. 11/27/14  Yes Erlene Quan, PA-C  Cholecalciferol (VITAMIN D3) 5000 UNITS CAPS Take 1 capsule by mouth daily.    Yes Historical Provider, MD  Coenzyme Q10 (CO Q-10) 100 MG CAPS Take 100 mg by mouth daily.   Yes Historical Provider, MD  Cyanocobalamin 2500 MCG SUBL Place 2,500 mcg under the tongue daily.   Yes Historical  Provider, MD  digoxin (LANOXIN) 0.125 MG tablet Take 0.5 tablets (0.0625 mg total) by mouth daily. 05/20/14  Yes Jolaine Artist, MD  divalproex (DEPAKOTE ER) 250 MG 24 hr tablet Take 1 tablet (250 mg total) by mouth daily. 12/25/14  Yes Carmen Dohmeier, MD  ferrous sulfate 325 (65 FE) MG tablet Take 325 mg by mouth daily with breakfast.   Yes Historical Provider, MD  GLUCOSAMINE-CHONDROITIN PO Take 1,200 mg by mouth daily.   Yes Historical Provider, MD  hydrOXYzine (ATARAX/VISTARIL) 25 MG tablet Take 25 mg by mouth at bedtime. DOESN'T TAKE WITH TRAZODONE 10/29/14  Yes Historical Provider, MD  loratadine (CLARITIN) 10 MG tablet Take 10 mg by mouth daily.   Yes Historical Provider, MD  metolazone (ZAROXOLYN) 2.5 MG tablet Take 1 tablet (2.5 mg total) by mouth as needed. Patient taking differently: Take 2.5 mg by mouth as needed (fluid).  02/26/15  Yes Shaune Pascal Bensimhon, MD  montelukast (SINGULAIR) 10 MG tablet TAKE 1 TABLET (10 MG TOTAL) BY MOUTH AT BEDTIME. 10/25/14  Yes Chesley Mires, MD  Multiple Vitamin (MULTIVITAMIN WITH MINERALS) TABS Take 1 tablet by mouth daily.   Yes Historical Provider, MD  nystatin (MYCOSTATIN) 100000 UNIT/ML suspension Take 5 mLs (500,000 Units total) by mouth 4 (four) times daily. Patient taking differently: Take 5 mLs by mouth 4 (four) times daily as needed (oral yeast).  07/08/14  Yes Golden Circle, FNP  omeprazole (PRILOSEC) 20 MG capsule TAKE 1 CAPSULE (20 MG TOTAL) BY MOUTH DAILY. 12/25/14  Yes Golden Circle, FNP  PATADAY 0.2 % SOLN Place 1 drop into both eyes daily.  01/22/13  Yes Historical Provider, MD  pentosan polysulfate (ELMIRON) 100 MG capsule Take 1 capsule (100 mg total) by mouth 2 (two) times daily. Patient taking differently: Take 200 mg by mouth daily.  07/08/14  Yes Golden Circle, FNP  potassium chloride SA (K-DUR,KLOR-CON) 20 MEQ tablet Take 1 tablet (20 mEq total) by mouth 3 (three) times a week. Every Mon, Wed and Fri with torsemide 07/18/14  Yes  Jolaine Artist, MD  pregabalin (LYRICA) 200 MG capsule Take 1 capsule (200 mg total) by mouth 2 (two) times daily. 12/25/14  Yes Carmen Dohmeier, MD  spironolactone (ALDACTONE) 25 MG tablet Take 0.5 tablets (12.5 mg total) by mouth daily. 11/27/14  Yes Luke K Kilroy, PA-C  torsemide (DEMADEX) 20 MG tablet Take 1 tablet (20 mg total) by mouth daily. 02/25/15  Yes Jolaine Artist, MD  traMADol (ULTRAM) 50 MG tablet Take 50 mg by mouth daily as needed for moderate pain.  10/31/14  Yes Historical Provider, MD  traZODone (DESYREL) 50 MG tablet Take 50 mg by mouth as needed. FOR SLEEP 10/29/14  Yes Historical Provider, MD  carbidopa-levodopa (SINEMET) 25-100 MG per tablet Take 1 tablet by mouth 3 (three) times daily. Patient not taking: Reported on 04/02/2015 12/25/14   Larey Seat, MD  digoxin (LANOXIN) 0.125 MG tablet TAKE 1 TABLET EVERY DAY Patient not taking: Reported on 04/02/2015 12/31/14   Shaune Pascal Bensimhon, MD   BP 106/54 mmHg  Pulse 84  Temp(Src) 98.2 F (36.8 C) (Oral)  Resp 18  SpO2 98% Physical Exam  Constitutional: She appears well-developed and well-nourished. No distress.  HENT:  Head: Normocephalic and atraumatic.  Eyes: Conjunctivae are normal. Right eye exhibits no discharge. Left eye exhibits no discharge. No scleral icterus.  Neck: Normal range of motion.  Cardiovascular: Normal rate, regular rhythm, normal heart sounds and intact distal pulses.   Pedal pulses palpable. Cap refill < 3  Pulmonary/Chest: Effort normal and breath sounds normal. No respiratory distress. She has no wheezes. She has no rales.  Breathing unlabored. Lungs CTAB.   Musculoskeletal: Normal range of motion. She exhibits edema and tenderness.  RIGHT KNEE TTP over anterior patella and medial femoral epicondyle. Knee mildly edematous without effusion. Full active ROM intact with moderate pain. No MCL/LCL laxity. RIGHT HIP Right hip pain on straight leg raise. Mildly TTP. No obvious deformity.   SACRUM TTP over sacrum and coccyx   Left knee and hip with FROM intact. FROM of toes b/l. No TTP or deformity. Lumbar spine ROM intact. No spinal TTP.   Neurological: She is alert. Coordination normal.  5/5 ankle strength  b/l. 4/5 right hip strength. 5/5 left hip strength. Sensation to light touch intact throughout   Skin: Skin is warm and dry.  Healing abrasion over left anterior knee. No ecchymosis or lacerations.   Psychiatric: She has a normal mood and affect. Her behavior is normal.  Nursing note and vitals reviewed.   ED Course  Procedures (including critical care time) Labs Review Labs Reviewed - No data to display  Imaging Review Ct Abdomen Pelvis W Wo Contrast  04/01/2015  CLINICAL DATA:  Followup liver lesion.  History of breast cancer. EXAM: CT ABDOMEN AND PELVIS WITHOUT AND WITH CONTRAST TECHNIQUE: Multidetector CT imaging of the abdomen and pelvis was performed following the standard protocol before and following the bolus administration of intravenous contrast. CONTRAST:  185mL OMNIPAQUE IOHEXOL 300 MG/ML  SOLN COMPARISON:  11/01/2014. FINDINGS: Lower chest: No pleural fluid. Heart size is normal. No pericardial effusion. Hepatobiliary: Hepatic steatosis is identified. There is relative hypertrophy of the lateral segment of left lobe of liver and caudate lobe of liver. Low-attenuation structure within the right lobe of liver measures 9 mm, image 15 of series 5. Unchanged from previous exam. Within the medial segment of left lobe of liver there is a stable enhancing structure measuring 1.9 cm, image 45 of series 5. This was present on exam dating back to 06/02/2012 and is favored to represent a benign process. Reflux of contrast material into the hepatic veins identified compatible with passive venous congestion. Previous cholecystectomy. No biliary dilatation. Pancreas: Negative. Spleen: The spleen is unremarkable. Adrenals/Urinary Tract: Normal appearance of the adrenal glands.  The kidneys are both unremarkable. The urinary bladder appears normal. Stomach/Bowel: The stomach appears within normal limits. The small bowel loops are also within normal limits. Unremarkable appearance of the colon. Vascular/Lymphatic: Normal appearance of the abdominal aorta. No upper abdominal or pelvic adenopathy. No inguinal adenopathy. Reproductive: Previous hysterectomy.  No adnexal mass. Other: No free fluid or fluid collections within the abdomen or pelvis. Musculoskeletal: Review of the visualized osseous structures is unremarkable. No aggressive lytic or sclerotic bone lesions. IMPRESSION: 1. Stable enhancing lesion within the medial segment of left lobe of liver dating back to 2013 which is favored to represent a benign process. 2. No change in low-attenuation structure within the right lobe of liver. Likely cysts. 3. Hepatic steatosis. 4. Evidence of passive venous congestion of the liver with reflux of contrast material from the right heart into the hepatic veins. 5. Hepatic steatosis with enlargement of the caudate lobe and lateral segment of left lobe. 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 Clinical correlation recommended. Electronically Signed   By: Kerby Moors M.D.   On: 04/01/2015 15:24   Dg Lumbar Spine Complete  04/02/2015  CLINICAL DATA:  Several falls in the last 2 weeks. Low back pain. Initial encounter. EXAM: LUMBAR SPINE - COMPLETE 4+ VIEW COMPARISON:  CT abdomen and pelvis 04/01/2015 FINDINGS: There are 5 non rib-bearing lumbar type vertebral bodies. Residual oral contrast material is present in the colon, and this obscures portions of the lower lumbar spine and sacrum on AP and oblique images. Vertebral body heights are preserved without evidence of compression fracture. There is at most trace retrolisthesis of L5 on S1, unchanged. Mild lower lumbar facet arthrosis is noted. No pars defects are seen. Intervertebral disc space heights are preserved. Right upper quadrant  abdominal surgical clips are present. Excreted IV contrast is present in the bladder. IMPRESSION: No acute osseous abnormality identified. Mild lower lumbar facet arthrosis. Electronically Signed  By: Logan Bores M.D.   On: 04/02/2015 16:07   Dg Knee Complete 4 Views Right  04/02/2015  CLINICAL DATA:  Fall.  Right knee pain. EXAM: RIGHT KNEE - COMPLETE 4+ VIEW COMPARISON:  None. FINDINGS: Small suprapatellar right knee joint effusion. No fracture, malalignment or suspicious focal osseous lesion. No appreciable degenerative or erosive arthropathy. There is diffuse soft tissue swelling. IMPRESSION: Small suprapatellar right knee joint effusion with no fracture or malalignment in the right knee. Electronically Signed   By: Ilona Sorrel M.D.   On: 04/02/2015 16:05   Dg Hip Unilat With Pelvis 2-3 Views Right  04/02/2015  CLINICAL DATA:  Multiple falls in the last 2 weeks with right hip pain. Initial encounter. History breast cancer. EXAM: DG HIP (WITH OR WITHOUT PELVIS) 2-3V RIGHT COMPARISON:  Abdominal CT 11/01/2014 FINDINGS: There is no evidence of hip fracture or dislocation. There is no evidence of arthropathy or other focal bone abnormality. IMPRESSION: Negative. Electronically Signed   By: Monte Fantasia M.D.   On: 04/02/2015 16:07   I have personally reviewed and evaluated these images and lab results as part of my medical decision-making.   EKG Interpretation None      MDM   Final diagnoses:  Right knee pain  Coccyx pain   Pt presents following a fall with right knee, hip and coccyx pain. Pt reports chronic neuropathy due to chemo which causes her to fall frequently. Pt noted to be slightly hypotensive (100s/60s). Pt reports this is baseline for her. Chart review shows this to be true. Right knee TTP with mild edema. Coccyx TTP. Legs neurovascularly intact. Pt able to ambulate with a cane. Patient X-Ray negative for obvious fracture or dislocation. Pain managed in ED with tylenol. Pt  advised to follow up with orthopedics if symptoms persist. Patient given brace while in ED, conservative therapy recommended and discussed. Specifically discussed RICE therapy and cryotherapy. Patient will be dc home & is agreeable with above plan.     Josephina Gip, PA-C 04/02/15 1828  Dorie Rank, MD 04/03/15 [phone]

## 2015-04-02 NOTE — Discharge Instructions (Signed)
-RICE (rest, ice, compression sleeve, elevate) the knee - Use cushion under buttocks when sitting on hard surfaces - Continue tylenol or ibuprofen for pain control - Follow up with orthopedics if symptoms persist  Knee Pain Knee pain is a very common symptom and can have many causes. Knee pain often goes away when you follow your health care provider's instructions for relieving pain and discomfort at home. However, knee pain can develop into a condition that needs treatment. Some conditions may include:  Arthritis caused by wear and tear (osteoarthritis).  Arthritis caused by swelling and irritation (rheumatoid arthritis or gout).  A cyst or growth in your knee.  An infection in your knee joint.  An injury that will not heal.  Damage, swelling, or irritation of the tissues that support your knee (torn ligaments or tendinitis). If your knee pain continues, additional tests may be ordered to diagnose your condition. Tests may include X-rays or other imaging studies of your knee. You may also need to have fluid removed from your knee. Treatment for ongoing knee pain depends on the cause, but treatment may include:  Medicines to relieve pain or swelling.  Steroid injections in your knee.  Physical therapy.  Surgery. HOME CARE INSTRUCTIONS  Take medicines only as directed by your health care provider.  Rest your knee and keep it raised (elevated) while you are resting.  Do not do things that cause or worsen pain.  Avoid high-impact activities or exercises, such as running, jumping rope, or doing jumping jacks.  Apply ice to the knee area:  Put ice in a plastic bag.  Place a towel between your skin and the bag.  Leave the ice on for 20 minutes, 2-3 times a day.  Ask your health care provider if you should wear an elastic knee support.  Keep a pillow under your knee when you sleep.  Lose weight if you are overweight. Extra weight can put pressure on your knee.  Do not  use any tobacco products, including cigarettes, chewing tobacco, or electronic cigarettes. If you need help quitting, ask your health care provider. Smoking may slow the healing of any bone and joint problems that you may have. SEEK MEDICAL CARE IF:  Your knee pain continues, changes, or gets worse.  You have a fever along with knee pain.  Your knee buckles or locks up.  Your knee becomes more swollen. SEEK IMMEDIATE MEDICAL CARE IF:   Your knee joint feels hot to the touch.  You have chest pain or trouble breathing.   This information is not intended to replace advice given to you by your health care provider. Make sure you discuss any questions you have with your health care provider.   Document Released: 03/28/2007 Document Revised: 06/21/2014 Document Reviewed: 01/14/2014 Elsevier Interactive Patient Education 2016 [place] Injury The tailbone (coccyx) is the small bone at the lower end of the spine. A tailbone injury may involve stretched ligaments, bruising, or a broken bone (fracture). Tailbone injuries can be painful, and some may take a long time to heal. CAUSES This condition is often caused by falling and landing on the tailbone. Other causes include:  Repeated strain or friction from actions such as rowing and bicycling.  Childbirth. In some cases, the cause may not be known. RISK FACTORS This condition is more common in women than in men. SYMPTOMS Symptoms of this condition include:  Pain in the lower back, especially when sitting.  Pain or difficulty when standing up from  a sitting position.  Bruising in the tailbone area.  Painful bowel movements.  In women, pain during intercourse. DIAGNOSIS This condition may be diagnosed based on your symptoms and a physical exam. X-rays may be taken if a fracture is suspected. You may also have other tests, such as a CT scan or MRI. TREATMENT This condition may be treated with medicines to help relieve  your pain. Most tailbone injuries heal on their own in 4-6 weeks. However, recovery time may be longer if the injury involves a fracture. HOME CARE INSTRUCTIONS  Take medicines only as directed by your health care provider.  If directed, apply ice to the injured area:  Put ice in a plastic bag.  Place a towel between your skin and the bag.  Leave the ice on for 20 minutes, 2-3 times per day for the first 1-2 days.  Sit on a large, rubber or inflated ring or cushion to ease your pain. Lean forward when you are sitting to help decrease discomfort.  Avoid sitting for long periods of time.  Increase your activity as the pain allows. Perform any exercises that are recommended by your health care provider or physical therapist.  If you have pain during bowel movements, use stool softeners as directed by your health care provider.  Eat a diet that includes plenty of fiber to help prevent constipation.  Keep all follow-up visits as directed by your health care provider. This is important. PREVENTION Wear appropriate padding and sports gear when bicycling and rowing. This can help to prevent developing an injury that is caused by repeated strain or friction. SEEK MEDICAL CARE IF:  Your pain becomes worse.  Your bowel movements cause a great deal of discomfort.  You are unable to have a bowel movement.  You have uncontrolled urine loss (urinary incontinence).  You have a fever.   This information is not intended to replace advice given to you by your health care provider. Make sure you discuss any questions you have with your health care provider.   Document Released: 05/28/2000 Document Revised: 10/15/2014 Document Reviewed: 05/27/2014 Elsevier Interactive Patient Education 2016 [place].  Cryotherapy Cryotherapy means treatment with cold. Ice or gel packs can be used to reduce both pain and swelling. Ice is the most helpful within the first 24 to 48 hours after an injury or  flare-up from overusing a muscle or joint. Sprains, strains, spasms, burning pain, shooting pain, and aches can all be eased with ice. Ice can also be used when recovering from surgery. Ice is effective, has very few side effects, and is safe for most people to use. PRECAUTIONS  Ice is not a safe treatment option for people with:  Raynaud phenomenon. This is a condition affecting small blood vessels in the extremities. Exposure to cold may cause your problems to return.  Cold hypersensitivity. There are many forms of cold hypersensitivity, including:  Cold urticaria. Red, itchy hives appear on the skin when the tissues begin to warm after being iced.  Cold erythema. This is a red, itchy rash caused by exposure to cold.  Cold hemoglobinuria. Red blood cells break down when the tissues begin to warm after being iced. The hemoglobin that carry oxygen are passed into the urine because they cannot combine with blood proteins fast enough.  Numbness or altered sensitivity in the area being iced. If you have any of the following conditions, do not use ice until you have discussed cryotherapy with your caregiver:  Heart conditions, such as  arrhythmia, angina, or chronic heart disease.  High blood pressure.  Healing wounds or open skin in the area being iced.  Current infections.  Rheumatoid arthritis.  Poor circulation.  Diabetes. Ice slows the blood flow in the region it is applied. This is beneficial when trying to stop inflamed tissues from spreading irritating chemicals to surrounding tissues. However, if you expose your skin to cold temperatures for too long or without the proper protection, you can damage your skin or nerves. Watch for signs of skin damage due to cold. HOME CARE INSTRUCTIONS Follow these tips to use ice and cold packs safely.  Place a dry or damp towel between the ice and skin. A damp towel will cool the skin more quickly, so you may need to shorten the time that the  ice is used.  For a more rapid response, add gentle compression to the ice.  Ice for no more than 10 to 20 minutes at a time. The bonier the area you are icing, the less time it will take to get the benefits of ice.  Check your skin after 5 minutes to make sure there are no signs of a poor response to cold or skin damage.  Rest 20 minutes or more between uses.  Once your skin is numb, you can end your treatment. You can test numbness by very lightly touching your skin. The touch should be so light that you do not see the skin dimple from the pressure of your fingertip. When using ice, most people will feel these normal sensations in this order: cold, burning, aching, and numbness.  Do not use ice on someone who cannot communicate their responses to pain, such as small children or people with dementia. HOW TO MAKE AN ICE PACK Ice packs are the most common way to use ice therapy. Other methods include ice massage, ice baths, and cryosprays. Muscle creams that cause a cold, tingly feeling do not offer the same benefits that ice offers and should not be used as a substitute unless recommended by your caregiver. To make an ice pack, do one of the following:  Place crushed ice or a bag of frozen vegetables in a sealable plastic bag. Squeeze out the excess air. Place this bag inside another plastic bag. Slide the bag into a pillowcase or place a damp towel between your skin and the bag.  Mix 3 parts water with 1 part rubbing alcohol. Freeze the mixture in a sealable plastic bag. When you remove the mixture from the freezer, it will be slushy. Squeeze out the excess air. Place this bag inside another plastic bag. Slide the bag into a pillowcase or place a damp towel between your skin and the bag. SEEK MEDICAL CARE IF:  You develop white spots on your skin. This may give the skin a blotchy (mottled) appearance.  Your skin turns blue or pale.  Your skin becomes waxy or hard.  Your swelling gets  worse. MAKE SURE YOU:   Understand these instructions.  Will watch your condition.  Will get help right away if you are not doing well or get worse.   This information is not intended to replace advice given to you by your health care provider. Make sure you discuss any questions you have with your health care provider.   Document Released: 01/25/2011 Document Revised: 06/21/2014 Document Reviewed: 01/25/2011 Elsevier Interactive Patient Education [employer].

## 2015-04-03 ENCOUNTER — Encounter: Payer: Self-pay | Admitting: Internal Medicine

## 2015-04-03 ENCOUNTER — Ambulatory Visit (HOSPITAL_COMMUNITY)
Admission: RE | Admit: 2015-04-03 | Discharge: 2015-04-03 | Disposition: A | Discharge status: Home / Self Care | Payer: Medicare Other | Source: Ambulatory Visit | Attending: Internal Medicine | Admitting: Internal Medicine

## 2015-04-03 VITALS — BP 102/66 | HR 81 | Wt 251.5 lb

## 2015-04-03 DIAGNOSIS — Z79899 Other long term (current) drug therapy: Secondary | ICD-10-CM | POA: Insufficient documentation

## 2015-04-03 DIAGNOSIS — K219 Gastro-esophageal reflux disease without esophagitis: Secondary | ICD-10-CM | POA: Diagnosis not present

## 2015-04-03 DIAGNOSIS — J449 Chronic obstructive pulmonary disease, unspecified: Secondary | ICD-10-CM | POA: Insufficient documentation

## 2015-04-03 DIAGNOSIS — I10 Essential (primary) hypertension: Secondary | ICD-10-CM | POA: Diagnosis not present

## 2015-04-03 DIAGNOSIS — Z853 Personal history of malignant neoplasm of breast: Secondary | ICD-10-CM | POA: Insufficient documentation

## 2015-04-03 DIAGNOSIS — E119 Type 2 diabetes mellitus without complications: Secondary | ICD-10-CM | POA: Diagnosis not present

## 2015-04-03 DIAGNOSIS — M797 Fibromyalgia: Secondary | ICD-10-CM | POA: Diagnosis not present

## 2015-04-03 DIAGNOSIS — F449 Dissociative and conversion disorder, unspecified: Secondary | ICD-10-CM | POA: Diagnosis not present

## 2015-04-03 DIAGNOSIS — I428 Other cardiomyopathies: Secondary | ICD-10-CM | POA: Insufficient documentation

## 2015-04-03 DIAGNOSIS — J45909 Unspecified asthma, uncomplicated: Secondary | ICD-10-CM | POA: Diagnosis not present

## 2015-04-03 DIAGNOSIS — Z95810 Presence of automatic (implantable) cardiac defibrillator: Secondary | ICD-10-CM | POA: Insufficient documentation

## 2015-04-03 DIAGNOSIS — I5022 Chronic systolic (congestive) heart failure: Secondary | ICD-10-CM

## 2015-04-03 DIAGNOSIS — Z955 Presence of coronary angioplasty implant and graft: Secondary | ICD-10-CM | POA: Diagnosis not present

## 2015-04-03 LAB — BASIC METABOLIC PANEL
ANION GAP: 10 (ref 5–15)
BUN: 12 mg/dL (ref 6–20)
CALCIUM: 9.4 mg/dL (ref 8.9–10.3)
CO2: 27 mmol/L (ref 22–32)
Chloride: 102 mmol/L (ref 101–111)
Creatinine, Ser: 0.99 mg/dL (ref 0.44–1.00)
Glucose, Bld: 67 mg/dL (ref 65–99)
POTASSIUM: 4 mmol/L (ref 3.5–5.1)
Sodium: 139 mmol/L (ref 135–145)

## 2015-04-03 LAB — DIGOXIN LEVEL: DIGOXIN LVL: 0.4 ng/mL — AB (ref 0.8–2.0)

## 2015-04-03 MED ORDER — LOSARTAN POTASSIUM 25 MG PO TABS: 12.5000 mg | ORAL_TABLET | Freq: Every day | ORAL | Status: DC

## 2015-04-03 NOTE — Patient Instructions (Signed)
Start Losartan 12.5 mg (1/2 tab) daily at bedtime  Labs today  You have been referred to [facility] in 2 weeks, home health can do  Your physician has requested that you have an echocardiogram. Echocardiography is a painless test that uses sound waves to create images of your heart. It provides your doctor with information about the size and shape of your heart and how well your heart's chambers and valves are working. This procedure takes approximately one hour. There are no restrictions for this procedure.  We will contact you in 3 months to schedule your next appointment and echocardiogram

## 2015-04-03 NOTE — Progress Notes (Signed)
ADV  Patient ID: [name], female   DOB: Mar 18, 1971, 44 y.o.   MRN: [id number]  Primary Cardiologist:  Dr. Arvilla Meres Neurlogist: Dohmeir   PCP: Dr. Consuella Lose Griffin/Noel Redmon, PAC   History of Present Illness: [name] is a 44 y.o. female with a history of morbid obesity, depression, fibromyalgia, congestive heart failure secondary to nonischemic cardiomyopathy (EF 20-25%) related to her chemotherapy for breast cancer. Last chemo 2008. Evaluated by neuro for stuttering.  Felt to have a conversion disorder. Confirmed by [facility].   She presented to [facility] on 11/26/14 with near syncope and dyspnea.With + orthostatics in ER (SBP 104 to 87 from lying to sitting) She also had volume overload with acute on chronic CHF. BNP 1093.0. Troponins normal. Had 7 lb  gain over 2 days with dietary indiscretion of sodium.Admitted for IV diuretic therapy and monitoring of BP. Coreg reduced 12.5 mg BID. Symptoms improved and good diuresis with IV lasix therapy. I/Os net negative 6 L total. Was transitioned back to PO Torsemide once euvolemic. Discharge weight was 249 lb.  Evaluated by pulmonary August 201 and Dr Vassie Loll told he saw no evidence of asthma.    She returns today for post hospital follow up.  She continues to struggle with LE edema. Has taken torsemide almost every day. Also took metolazone yesterday. Has good response. Weighing everyday.  Weight at home 243. Remains SOB with any activity. Dizziness much improved with backing down carvedilol.  Continues to struggle with drop attacks from conversion d/  Here for routine f/u: Has been trying to do a little more but gets very winded with just mild activity. In store stops every couple aisles and takes a break. Struggling with back pain - taking motrin 2x/day. Was in ER yesterday for recurrent pain. Weight stable. Takes extra torsemide about once a week with good control. No orthopnea or PND.   At last visit digoxin decreased due to elevated  level. Off ACE/ARB due to hyperkalemia  ICD interrogated personally: No VT/AF. Activity level ~ 1 hour (up a little). Optivol ok. Now going up a little.   Studies:  CPX Feb 2010. Peak VO2 was 14.5 which was 71% of predicted. When corrected for body weight the VO2 was 20.7. The slope was 27. RER 1.20. O2 pulse 73%. Overall this was felt to be only a very mild functional limitation due to her obesity and mild circulatory limitation.  CPX 2/11: pVO2 13.0 (72%) correct for ideal wt 21ml/kg/min RER 1.06 (submax) slope 28.2 O2 pulse 91% - felt no signifcant cardiac limitation. + deconditioning.   Echo 04/2009  EF back down to 25%.  RHC normal. Echo 07/2009 showed EF 50%  Echo 06/2010 EF 40%. So we did MUGA EF 58% Echo 12/2010 40-45% Echo 05/2011 EF 30%. Grade 2 diastolic dysfunction Cath (12/28/10) was set up and demonstrated EF 20-25% and normal coronary arteries.  RA  4, RV 35/11, PA  28/8 (17), PCWP 8. Fick 5.2/2.4 PVR 1.7 Woods. Fick 5.2/2.4 Aortic saturation was 97%.  PA saturation was 67% and 68%. ECHO 09/03/13 EF 20-25%  ECHO 06/24/14 EF 20-25% Cardiac MRI on 07/19/11: Moderate to severe LVE, Diffuse hypokinesis. EF 32%, Mild LAE  Labs  09/03/13 k 3.3 Creatinine 0.86 Pro BNP 998 Dig level 0.7 05/17/14 K 4.7 Creatinine 0.9 pBNP 1007 digoxin 1.2 11/29/14 K 4.2 Creatinine 1.08 Dig 0.3  SH: lived with her husband. Does not drive. Does not drink alcohol or smoke   Past Medical History  Diagnosis Date  .  CHF (congestive heart failure) (HCC)     due to non-ischemic cardiomyopathy, thought to be chemotherapy induced;  cath 7/12: normal cors, EF 20-25%. Cardiac MRI 05/2011 EF 32%. ICD implantation 10/2011 (Medtronic)  . Peripheral neuropathy (HCC)     chemo- induced  . Hypertension     c/b orthostatic hypotention  . Depression   . Palpitation     normal sinus rhythm only on 21 day heart monitor  . Obesity   . Fibromyalgia   . GERD (gastroesophageal reflux disease)   . Breast calcifications      right breast  . Interstitial cystitis   . Anxiety   . Nonischemic cardiomyopathy (HCC)     related to chemo; EF 30% 05/2011  . ICD (implantable cardiac defibrillator) in place   . Pneumonia 2000's    "once"  . Type II diabetes mellitus (HCC)   . Blood transfusion 1980's    1987 or 1988  . Anemia 1980's    [id number]  . History of stomach ulcers   . Breast cancer (HCC)     right;  . Conversion disorder   . 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   . 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 acetaminophen (TYLENOL) 325 MG tablet Take 2 tablets (650 mg total) by mouth every 4 (four) hours as needed for headache or mild pain.    . beclomethasone (QVAR) 80 MCG/ACT inhaler Inhale 2 puffs into the lungs 2 (two) times daily.     . carvedilol (COREG) 25 MG tablet Take 0.5 tablets (12.5 mg total) by mouth 2 (two) times daily with a meal. 60 tablet 6  . Cholecalciferol (VITAMIN D3) 5000 UNITS CAPS Take 1 capsule by mouth daily.     . Coenzyme Q10 (CO Q-10) 100 MG CAPS Take 100 mg by mouth daily.    . Cyanocobalamin 2500 MCG SUBL Place 2,500 mcg under the tongue daily.    . digoxin (LANOXIN) 0.125 MG tablet Take 0.5 tablets (0.0625 mg total) by mouth daily. 30 tablet 6  . divalproex (DEPAKOTE ER) 250 MG 24 hr tablet Take 1 tablet (250 mg total) by mouth daily. 60 tablet 3  . ferrous sulfate 325 (65 FE) MG tablet Take 325 mg by mouth daily with breakfast.    . GLUCOSAMINE-CHONDROITIN PO Take 1,200 mg by mouth daily.    . hydrOXYzine (ATARAX/VISTARIL) 25 MG tablet Take 25 mg by mouth at bedtime. DOESN'T TAKE WITH TRAZODONE    . loratadine (CLARITIN) 10 MG tablet Take 10 mg by mouth daily.    . Multiple Vitamin (MULTIVITAMIN WITH MINERALS) TABS Take 1 tablet by mouth  daily.    Marland Kitchen omeprazole (PRILOSEC) 20 MG capsule TAKE 1 CAPSULE (20 MG TOTAL) BY MOUTH DAILY. 30 capsule 5  . PATADAY 0.2 % SOLN Place 1 drop into both eyes daily.     . pregabalin (LYRICA) 200 MG capsule Take 1 capsule (200 mg total) by mouth 2 (two) times daily. 60 capsule 5  . spironolactone (ALDACTONE) 25 MG tablet Take 0.5 tablets (12.5 mg total) by mouth daily. 15 tablet 6  . torsemide (DEMADEX) 20 MG tablet Take 1 tablet (20 mg total) by mouth daily. 30 tablet 1  . traMADol (ULTRAM) 50 MG tablet Take 50 mg by mouth daily as needed for moderate  pain.     . traZODone (DESYREL) 50 MG tablet Take 50 mg by mouth as needed. FOR SLEEP    . albuterol (PROAIR HFA) 108 (90 BASE) MCG/ACT inhaler Inhale 1-2 puffs into the lungs every 6 (six) hours as needed for wheezing or shortness of breath. Pt needs appt for more refills (Patient not taking: Reported on 04/03/2015) 8.5 each 0   No current facility-administered medications on file prior to encounter.     Allergies  Allergen Reactions  . Reglan [Metoclopramide Hcl] Anxiety  . Adhesive [Tape] Itching and Rash  . Other Itching and Rash    Chloraprep  . Ace Inhibitors     hyperkalemia  . Losartan     hyperkalemia  . Chlorhexidine Itching and Rash   ROS: All pertinent positives or negatives as in HPI otherwise negative   Vital Signs: Filed Vitals:   04/03/15 1359  BP: 102/66  Pulse: 81  Weight: 251 lb 8 oz (114.08 kg)  SpO2: 97%    PHYSICAL EXAM: Well nourished, well developed, in no acute distress. Husband present  HEENT: normal Neck: JVP flat Cardiac:  Lateral PMI, normal S1, S2; RRR; no murmur, No S3L upper chest scar ICD Lungs:  clear to auscultation bilaterally, no wheezing, rhonchi or rales Abd: obese, soft, nontender, nondistended Ext: warm 1+ lower extremity edema.    Skin: warm and dry Neuro:  CNs 2-12 intact, no focal abnormalities noted Psych: Normal affect  ASSESSMENT AND PLAN: 1. Chronic systolic CHF: Nonischemic  cardiomyopathy.  Medtronic ICD. I reviewed echo today personally and EF remains 20-25% with moderately dilated LV.  NYHA III-IIIB.  - Severely deconditioned and depressed and is not motivated to be more active.  - Volume status ok on exam and by optivol. Would take torsemide 20 mg daily. Can hold if weight < 240.  - On carvedilol  12.5 mg twice a day. Dose recently reduced due to orthostasis.  - On dig 0.0625 mg daily. Check level today. - Will try losartan 12.5 daily. If k increases can add Patiromer - Check labs today 2. Conversion disorder: Followed by neurology  3. Morbid obesity: Needs to work at diet/exercise for weight loss.  4. Deconditioning/falls - She is very debilitated. Refer to PT  Jaretssi Kraker,MD 2:36 PM

## 2015-04-03 NOTE — Progress Notes (Signed)
Advanced Heart Failure Medication Review by a Pharmacist  Does the patient  feel that his/her medications are working for him/her?  yes  Has the patient been experiencing any side effects to the medications prescribed?  no  Does the patient measure his/her own blood pressure or blood glucose at home?  yes   Does the patient have any problems obtaining medications due to transportation or finances?   no  Understanding of regimen: good Understanding of indications: good Potential of compliance: good Patient understands to avoid NSAIDs. Patient understands to avoid decongestants.  Issues to address at subsequent visits: Ibuprofen use   Pharmacist comments:  [name] is a pleasant 44 yo F presenting without a medication list. She reports good compliance with her medications and we reviewed how to manage missed doses of her medications. We did review the indications and basic mechanism behind her HF medications. I noted that she is using ibuprofen 800 mg BID scheduled for fibromyalgia inflammation. We discussed the risks associated with this class of medications and she verbalized understanding. This information will be relayed to Dr. Haroldine Laws. She did not have any other medication-related questions or concerns at this time.   [name]. [name], PharmD, BCPS, CPP Clinical Pharmacist Pager: [phone] Phone: [phone] 04/03/2015 2:38 PM   Time with patient: 10 minutes Preparation and documentation time: 2 minutes Total time: 12 minutes

## 2015-04-04 NOTE — Addendum Note (Signed)
Encounter addended by: Carilyn Goodpasture, RN on: 04/04/2015  3:22 PM<BR>     Documentation filed: [employer], Orders

## 2015-04-17 ENCOUNTER — Telehealth (HOSPITAL_COMMUNITY): Payer: Self-pay

## 2015-04-17 DIAGNOSIS — E119 Type 2 diabetes mellitus without complications: Secondary | ICD-10-CM | POA: Diagnosis not present

## 2015-04-17 DIAGNOSIS — H11423 Conjunctival edema, bilateral: Secondary | ICD-10-CM | POA: Diagnosis not present

## 2015-04-17 DIAGNOSIS — H11153 Pinguecula, bilateral: Secondary | ICD-10-CM | POA: Diagnosis not present

## 2015-04-17 DIAGNOSIS — H3589 Other specified retinal disorders: Secondary | ICD-10-CM | POA: Diagnosis not present

## 2015-04-17 DIAGNOSIS — H25013 Cortical age-related cataract, bilateral: Secondary | ICD-10-CM | POA: Diagnosis not present

## 2015-04-17 DIAGNOSIS — H40023 Open angle with borderline findings, high risk, bilateral: Secondary | ICD-10-CM | POA: Diagnosis not present

## 2015-04-17 DIAGNOSIS — H2513 Age-related nuclear cataract, bilateral: Secondary | ICD-10-CM | POA: Diagnosis not present

## 2015-04-17 DIAGNOSIS — H18413 Arcus senilis, bilateral: Secondary | ICD-10-CM | POA: Diagnosis not present

## 2015-04-17 NOTE — Telephone Encounter (Signed)
A physical therapist called from [place] to inform us that patient declined PT evaluation and services

## 2015-04-18 ENCOUNTER — Encounter: Payer: Self-pay | Admitting: Internal Medicine

## 2015-04-19 ENCOUNTER — Other Ambulatory Visit: Payer: Self-pay | Admitting: Cardiology

## 2015-04-25 DIAGNOSIS — J329 Chronic sinusitis, unspecified: Secondary | ICD-10-CM | POA: Diagnosis not present

## 2015-04-25 DIAGNOSIS — J309 Allergic rhinitis, unspecified: Secondary | ICD-10-CM | POA: Diagnosis not present

## 2015-05-10 ENCOUNTER — Other Ambulatory Visit: Payer: Self-pay | Admitting: Family

## 2015-05-10 ENCOUNTER — Other Ambulatory Visit: Payer: Self-pay | Admitting: Cardiology

## 2015-05-12 NOTE — Telephone Encounter (Signed)
Please advise. Does not show that you have prescribed medication

## 2015-05-14 DIAGNOSIS — J329 Chronic sinusitis, unspecified: Secondary | ICD-10-CM | POA: Diagnosis not present

## 2015-05-20 ENCOUNTER — Telehealth (HOSPITAL_COMMUNITY): Payer: Self-pay | Admitting: *Deleted

## 2015-05-20 NOTE — Telephone Encounter (Signed)
Pt states she has been taking apple cider vinegar and wants to make sure this is ok with her meds, also if she should take before or after meds or if it matters.  Deno Etienne in a meeting will have her call pt tomorrow

## 2015-05-21 NOTE — Telephone Encounter (Signed)
Spoke with patient about concerns with apple cider vinegar. There is some evidence to suggest that high amounts may decrease potassium levels. I've suggested that she space it out from her other medications and we will continue to monitor her potassium levels regularly. She verbalized understanding and will call with any issues.   [name]. [name], PharmD, BCPS, CPP Clinical Pharmacist Pager: [phone] Phone: [phone] 05/21/2015 12:19 PM

## 2015-05-22 ENCOUNTER — Other Ambulatory Visit (HOSPITAL_COMMUNITY): Payer: Self-pay | Admitting: Internal Medicine

## 2015-06-10 ENCOUNTER — Encounter: Payer: Self-pay | Admitting: *Deleted

## 2015-06-11 ENCOUNTER — Other Ambulatory Visit: Payer: Self-pay | Admitting: Family

## 2015-06-21 ENCOUNTER — Other Ambulatory Visit: Payer: Self-pay | Admitting: Nurse Practitioner

## 2015-06-23 ENCOUNTER — Other Ambulatory Visit: Payer: Self-pay | Admitting: Family

## 2015-06-30 ENCOUNTER — Other Ambulatory Visit (HOSPITAL_COMMUNITY): Payer: Self-pay | Admitting: *Deleted

## 2015-06-30 MED ORDER — TORSEMIDE 20 MG PO TABS: 20.0000 mg | ORAL_TABLET | Freq: Every day | ORAL | Status: DC

## 2015-07-07 ENCOUNTER — Other Ambulatory Visit: Payer: Self-pay | Admitting: Family

## 2015-07-11 ENCOUNTER — Other Ambulatory Visit: Payer: Self-pay | Admitting: Neurology

## 2015-07-16 ENCOUNTER — Encounter: Payer: Self-pay | Admitting: *Deleted

## 2015-08-13 ENCOUNTER — Encounter: Payer: Self-pay | Admitting: *Deleted

## 2015-08-14 ENCOUNTER — Other Ambulatory Visit: Payer: Self-pay | Admitting: Family

## 2015-08-14 ENCOUNTER — Other Ambulatory Visit (HOSPITAL_COMMUNITY): Payer: Self-pay | Admitting: Internal Medicine

## 2015-08-14 ENCOUNTER — Other Ambulatory Visit (HOSPITAL_COMMUNITY): Payer: Self-pay | Admitting: Adult Health

## 2015-08-20 ENCOUNTER — Other Ambulatory Visit: Payer: Self-pay

## 2015-08-20 DIAGNOSIS — Z1231 Encounter for screening mammogram for malignant neoplasm of breast: Secondary | ICD-10-CM

## 2015-08-21 ENCOUNTER — Telehealth (HOSPITAL_COMMUNITY): Payer: Self-pay | Admitting: Vascular Surgery

## 2015-08-21 DIAGNOSIS — I5022 Chronic systolic (congestive) heart failure: Secondary | ICD-10-CM

## 2015-08-21 NOTE — Telephone Encounter (Signed)
Pt called she believes she is due for an echo and appt w/ Db , she was due back in Sept but she did not get an appt , I dont see a note from last time stating this needs an echo I can get her appt w/ db but I need an order for the echo, is this pt due for an Echo?.. Please advise

## 2015-08-22 NOTE — Telephone Encounter (Signed)
Yes she needs echo, order placed

## 2015-09-01 DIAGNOSIS — K5909 Other constipation: Secondary | ICD-10-CM | POA: Diagnosis not present

## 2015-09-01 DIAGNOSIS — C50911 Malignant neoplasm of unspecified site of right female breast: Secondary | ICD-10-CM | POA: Diagnosis not present

## 2015-09-01 DIAGNOSIS — E1149 Type 2 diabetes mellitus with other diabetic neurological complication: Secondary | ICD-10-CM | POA: Diagnosis not present

## 2015-09-01 DIAGNOSIS — Z01411 Encounter for gynecological examination (general) (routine) with abnormal findings: Secondary | ICD-10-CM | POA: Diagnosis not present

## 2015-09-01 DIAGNOSIS — Z113 Encounter for screening for infections with a predominantly sexual mode of transmission: Secondary | ICD-10-CM | POA: Diagnosis not present

## 2015-09-01 DIAGNOSIS — R768 Other specified abnormal immunological findings in serum: Secondary | ICD-10-CM | POA: Diagnosis not present

## 2015-09-01 DIAGNOSIS — R109 Unspecified abdominal pain: Secondary | ICD-10-CM | POA: Diagnosis not present

## 2015-09-01 DIAGNOSIS — Z6837 Body mass index (BMI) 37.0-37.9, adult: Secondary | ICD-10-CM | POA: Diagnosis not present

## 2015-09-01 DIAGNOSIS — Z853 Personal history of malignant neoplasm of breast: Secondary | ICD-10-CM | POA: Insufficient documentation

## 2015-09-05 ENCOUNTER — Telehealth (HOSPITAL_COMMUNITY): Payer: Self-pay | Admitting: Vascular Surgery

## 2015-09-05 NOTE — Telephone Encounter (Signed)
Left pt message to reschedule appt due to holiday

## 2015-09-08 ENCOUNTER — Telehealth (HOSPITAL_COMMUNITY): Payer: Self-pay | Admitting: Vascular Surgery

## 2015-09-08 NOTE — Telephone Encounter (Signed)
Left pt message to change appt due to [place]

## 2015-09-09 ENCOUNTER — Telehealth (HOSPITAL_COMMUNITY): Payer: Self-pay | Admitting: Vascular Surgery

## 2015-09-09 NOTE — Telephone Encounter (Signed)
Left pt 3 rd  message to reschedule appt due to holiday on 4/14. Appt will be canceled, until pt calls to reschedule

## 2015-09-23 ENCOUNTER — Telehealth (HOSPITAL_COMMUNITY): Payer: Self-pay | Admitting: Vascular Surgery

## 2015-09-23 ENCOUNTER — Encounter (HOSPITAL_COMMUNITY): Payer: Self-pay | Admitting: Vascular Surgery

## 2015-09-23 NOTE — Telephone Encounter (Signed)
Called pt several times , to reschedule appt due to holiday, pt has not called back will send a letter with new appt date and time

## 2015-09-25 ENCOUNTER — Ambulatory Visit: Payer: Self-pay

## 2015-09-26 ENCOUNTER — Other Ambulatory Visit (HOSPITAL_COMMUNITY): Payer: Self-pay

## 2015-09-26 ENCOUNTER — Encounter (HOSPITAL_COMMUNITY): Payer: Self-pay | Admitting: Internal Medicine

## 2015-10-03 ENCOUNTER — Other Ambulatory Visit: Payer: Self-pay | Admitting: Internal Medicine

## 2015-10-04 DIAGNOSIS — M25562 Pain in left knee: Secondary | ICD-10-CM | POA: Diagnosis not present

## 2015-10-04 DIAGNOSIS — M25561 Pain in right knee: Secondary | ICD-10-CM | POA: Diagnosis not present

## 2015-10-04 DIAGNOSIS — M545 Low back pain: Secondary | ICD-10-CM | POA: Diagnosis not present

## 2015-10-06 ENCOUNTER — Ambulatory Visit (HOSPITAL_COMMUNITY): Payer: Medicare Other

## 2015-10-06 ENCOUNTER — Encounter (HOSPITAL_COMMUNITY): Payer: Self-pay | Admitting: Internal Medicine

## 2015-10-10 ENCOUNTER — Other Ambulatory Visit (HOSPITAL_COMMUNITY): Payer: Self-pay | Admitting: Orthopedic Surgery

## 2015-10-10 DIAGNOSIS — M545 Low back pain: Secondary | ICD-10-CM

## 2015-10-13 ENCOUNTER — Ambulatory Visit (HOSPITAL_COMMUNITY)
Admission: RE | Admit: 2015-10-13 | Discharge: 2015-10-13 | Disposition: A | Discharge status: Home / Self Care | Payer: Medicare Other | Source: Ambulatory Visit | Attending: Family | Admitting: Family

## 2015-10-13 ENCOUNTER — Telehealth (HOSPITAL_COMMUNITY): Payer: Self-pay | Admitting: Pharmacist

## 2015-10-13 ENCOUNTER — Ambulatory Visit (HOSPITAL_BASED_OUTPATIENT_CLINIC_OR_DEPARTMENT_OTHER)
Admission: RE | Admit: 2015-10-13 | Discharge: 2015-10-13 | Disposition: A | Discharge status: Home / Self Care | Payer: Medicare Other | Source: Ambulatory Visit | Attending: Internal Medicine | Admitting: Internal Medicine

## 2015-10-13 VITALS — BP 92/78 | HR 83 | Wt 240.4 lb

## 2015-10-13 DIAGNOSIS — I517 Cardiomegaly: Secondary | ICD-10-CM | POA: Insufficient documentation

## 2015-10-13 DIAGNOSIS — I340 Nonrheumatic mitral (valve) insufficiency: Secondary | ICD-10-CM | POA: Insufficient documentation

## 2015-10-13 DIAGNOSIS — E119 Type 2 diabetes mellitus without complications: Secondary | ICD-10-CM | POA: Diagnosis not present

## 2015-10-13 DIAGNOSIS — I351 Nonrheumatic aortic (valve) insufficiency: Secondary | ICD-10-CM | POA: Diagnosis not present

## 2015-10-13 DIAGNOSIS — E669 Obesity, unspecified: Secondary | ICD-10-CM | POA: Diagnosis not present

## 2015-10-13 DIAGNOSIS — J449 Chronic obstructive pulmonary disease, unspecified: Secondary | ICD-10-CM | POA: Diagnosis not present

## 2015-10-13 DIAGNOSIS — I5022 Chronic systolic (congestive) heart failure: Secondary | ICD-10-CM | POA: Diagnosis not present

## 2015-10-13 DIAGNOSIS — I071 Rheumatic tricuspid insufficiency: Secondary | ICD-10-CM | POA: Insufficient documentation

## 2015-10-13 LAB — COMPREHENSIVE METABOLIC PANEL
ALT: 19 U/L (ref 14–54)
AST: 31 U/L (ref 15–41)
Albumin: 3.5 g/dL (ref 3.5–5.0)
Alkaline Phosphatase: 99 U/L (ref 38–126)
Anion gap: 10 (ref 5–15)
BUN: 10 mg/dL (ref 6–20)
CALCIUM: 9.1 mg/dL (ref 8.9–10.3)
CHLORIDE: 102 mmol/L (ref 101–111)
CO2: 26 mmol/L (ref 22–32)
Creatinine, Ser: 0.84 mg/dL (ref 0.44–1.00)
GFR calc Af Amer: >60 mL/min (ref >60)
Glucose, Bld: 93 mg/dL (ref 65–99)
POTASSIUM: 4.1 mmol/L (ref 3.5–5.1)
Sodium: 138 mmol/L (ref 135–145)
TOTAL PROTEIN: 7.7 g/dL (ref 6.5–8.1)
Total Bilirubin: 3 mg/dL — ABNORMAL HIGH (ref 0.3–1.2)

## 2015-10-13 LAB — MAGNESIUM: Magnesium: 1.7 mg/dL (ref 1.7–2.4)

## 2015-10-13 LAB — DIGOXIN LEVEL: Digoxin Level: 0.7 ng/mL — ABNORMAL LOW (ref 0.8–2.0)

## 2015-10-13 MED ORDER — IVABRADINE HCL 5 MG PO TABS: 5.0000 mg | ORAL_TABLET | Freq: Two times a day (BID) | ORAL | Status: DC

## 2015-10-13 NOTE — Telephone Encounter (Signed)
New start [place] through [place] (ID: [id number], [place], GRP [id number], phone [phone]). In anticipation of costly copay have enrolled her in [employer] foundation so that she will have $1500 toward copay costs through 10/11/2016. Info relayed to [facility].   Billing ID: [id number] Person Code: [id number] RX Group: [id number] RX BIN: [id number] PCN for Part D: [id number]   [name]. [name], PharmD, BCPS, CPP Clinical Pharmacist Pager: [phone] Phone: [phone] 10/13/2015 11:25 AM

## 2015-10-13 NOTE — Progress Notes (Signed)
  Echocardiogram 2D Echocardiogram has been performed.  [name] 10/13/2015, 10:14 AM

## 2015-10-13 NOTE — Progress Notes (Signed)
Patient ID: [name], female   DOB: 1971-05-13, 45 y.o.   MRN: [id number]  ADV  Patient ID: [name], female   DOB: June 22, 1970, 45 y.o.   MRN: [id number]  Primary Cardiologist:  Dr. Glori Bickers Neurlogist: Dohmeir   PCP: Dr. Margaretha Sheffield Griffin/Noel Redmon, PAC   History of Present Illness: [name] is a 45y.o. female with a history of morbid obesity, depression, fibromyalgia, congestive heart failure secondary to nonischemic cardiomyopathy (EF 20-25%) related to her chemotherapy for breast cancer. Last chemo 2008. Evaluated by neuro for stuttering.  Felt to have a conversion disorder. Confirmed by [facility].   She presented to [facility] on 11/26/14 with near syncope and dyspnea.With + orthostatics in ER (SBP 104 to 87 from lying to sitting) She also had volume overload with acute on chronic CHF. BNP 1093.0. Troponins normal. Had 7 lb  gain over 2 days with dietary indiscretion of sodium.Admitted for IV diuretic therapy and monitoring of BP. Coreg reduced 12.5 mg BID. Symptoms improved and good diuresis with IV lasix therapy. I/Os net negative 6 L total. Was transitioned back to PO Torsemide once euvolemic. Discharge weight was 249 lb.  Evaluated by pulmonary August 201 and Dr Elsworth Soho told he saw no evidence of asthma.    She returns today for regular follow up.  Says things are about the same. Able to do ADLs but limited due to severe back pain. Seeing Dr. Gladstone Lighter. Pending a bone scan. Weight stable. Takes torsemide 20 daily and takes extra 1-2x/week. On losartan 12.5. On low-dose due to low BP/hyperkalemia. No orthopnea or PND.  SBP typically in 90s. Occasional dizziness. Very fatigued. Taking high-dose magnesium supplement.    ICD interrogated personally: No VT/AF. Activity level ~ 1 hour (up a little). Optivol ok.   Studies:  CPX Feb 2010. Peak VO2 was 14.5 which was 71% of predicted. When corrected for body weight the VO2 was 20.7. The slope was 27. RER 1.20. O2 pulse 73%. Overall this  was felt to be only a very mild functional limitation due to her obesity and mild circulatory limitation.  CPX 2/11: pVO2 13.0 (72%) correct for ideal wt 18ml/kg/min RER 1.06 (submax) slope 28.2 O2 pulse 91% - felt no signifcant cardiac limitation. + deconditioning.   Echo 04/2009  EF back down to 25%.  RHC normal. Echo 07/2009 showed EF 50%  Echo 06/2010 EF 40%. So we did MUGA EF 58% Echo 12/2010 40-45% Echo 05/2011 EF 30%. Grade 2 diastolic dysfunction Cath (12/28/10) was set up and demonstrated EF 20-25% and normal coronary arteries.  RA  4, RV 35/11, PA  28/8 (17), PCWP 8. Fick 5.2/2.4 PVR 1.7 [place]. Fick 5.2/2.4 Aortic saturation was 97%.  PA saturation was 67% and 68%. ECHO 09/03/13 EF 20-25%  ECHO 06/24/14 EF 20-25% Cardiac MRI on 07/19/11: Moderate to severe LVE, Diffuse hypokinesis. EF 32%, Mild LAE ECHO 10/13/15 EF 20-25% Mild to moderate RV dysfunction   Labs  09/03/13 k 3.3 Creatinine 0.86 Pro BNP 998 Dig level 0.7 05/17/14 K 4.7 Creatinine 0.9 pBNP 1007 digoxin 1.2 11/29/14 K 4.2 Creatinine 1.08 Dig 0.3  SH: lived with her husband. Does not drive. Does not drink alcohol or smoke   Past Medical History  Diagnosis Date  . CHF (congestive heart failure) (HCC)     due to non-ischemic cardiomyopathy, thought to be chemotherapy induced;  cath 7/12: normal cors, EF 20-25%. Cardiac MRI 05/2011 EF 32%. ICD implantation 10/2011 (Medtronic)  . Peripheral neuropathy (HCC)     chemo- induced  .  Hypertension     c/b orthostatic hypotention  . Depression   . Palpitation     normal sinus rhythm only on 21 day heart monitor  . Obesity   . Fibromyalgia   . GERD (gastroesophageal reflux disease)   . Breast calcifications     right breast  . Interstitial cystitis   . Anxiety   . Nonischemic cardiomyopathy ([place])     related to chemo; EF 30% 05/2011  . ICD (implantable cardiac defibrillator) in place   . Pneumonia 2000's    "once"  . Type II diabetes mellitus ([place])   . Blood transfusion  1980's    1987 or 1988  . Anemia 1980's    [id number]  . History of stomach ulcers   . Breast cancer ([place])     right;  . Conversion disorder   . 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   . 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 acetaminophen (TYLENOL) 325 MG tablet Take 2 tablets (650 mg total) by mouth every 4 (four) hours as needed for headache or mild pain.    Marland Kitchen ALPRAZolam (XANAX) 1 MG tablet Take 1 mg by mouth at bedtime as needed for sleep.    . baclofen (LIORESAL) 10 MG tablet TAKE 1 TABLET BY MOUTH 2 (TWO) TIMES DAILY AS NEEDED FOR MUSCLE SPASMS. 60 tablet 0  . beclomethasone (QVAR) 80 MCG/ACT inhaler Inhale 2 puffs into the lungs 2 (two) times daily.     . carvedilol (COREG) 25 MG tablet Take 0.5 tablets (12.5 mg total) by mouth 2 (two) times daily with a meal. 60 tablet 6  . Cholecalciferol (VITAMIN D3) 5000 UNITS CAPS Take 1 capsule by mouth daily.     . Coenzyme Q10 (CO Q-10) 100 MG CAPS Take 100 mg by mouth daily.    . Cyanocobalamin 2500 MCG SUBL Place 2,500 mcg under the tongue daily.    . ferrous sulfate 325 (65 FE) MG tablet Take 325 mg by mouth daily with breakfast.    . GLUCOSAMINE-CHONDROITIN PO Take 1,200 mg by mouth daily.    . hydrOXYzine (ATARAX/VISTARIL) 25 MG tablet Take 25 mg by mouth at bedtime. DOESN'T TAKE WITH TRAZODONE    . ibuprofen (ADVIL,MOTRIN) 200 MG tablet Take 800 mg by mouth 2 (two) times daily as needed for moderate pain.     Marland Kitchen loratadine (CLARITIN) 10 MG tablet Take 10 mg by mouth daily.    Marland Kitchen losartan (COZAAR) 25 MG tablet TAKE 0.5 TABLETS (12.5 MG TOTAL) BY MOUTH DAILY. 15 tablet 3  . metolazone (ZAROXOLYN) 2.5 MG tablet Take 2.5 mg by mouth daily as needed (edema).    . nystatin (MYCOSTATIN) 100000 UNIT/ML  suspension Take 5 mLs by mouth 4 (four) times daily as needed (thrush).    Marland Kitchen omeprazole (PRILOSEC) 20 MG capsule TAKE 1 CAPSULE BY MOUTH DAILY. 30 capsule 5  . PATADAY 0.2 % SOLN Place 1 drop into both eyes daily.     . pentosan polysulfate (ELMIRON) 100 MG capsule Take 200 mg by mouth daily.    . potassium chloride SA (K-DUR,KLOR-CON) 20 MEQ tablet Take 20 mEq by mouth daily.    . pregabalin (LYRICA) 200 MG capsule Take 1 capsule (200 mg total) by mouth 2 (two) times daily. 60 capsule 5  . spironolactone (  ALDACTONE) 25 MG tablet Take 0.5 tablets (12.5 mg total) by mouth daily. 15 tablet 6  . torsemide (DEMADEX) 20 MG tablet Take 1 tablet (20 mg total) by mouth daily. 30 tablet 1  . traMADol (ULTRAM) 50 MG tablet Take 50 mg by mouth daily as needed for moderate pain.     . traZODone (DESYREL) 50 MG tablet Take 50 mg by mouth as needed. FOR SLEEP    . albuterol (PROAIR HFA) 108 (90 BASE) MCG/ACT inhaler Inhale 1-2 puffs into the lungs every 6 (six) hours as needed for wheezing or shortness of breath. Pt needs appt for more refills (Patient not taking: Reported on 04/03/2015) 8.5 each 0  . Multiple Vitamin (MULTIVITAMIN WITH MINERALS) TABS Take 1 tablet by mouth daily. Reported on 10/13/2015     No current facility-administered medications on file prior to encounter.     Allergies  Allergen Reactions  . Reglan [Metoclopramide Hcl] Anxiety  . Adhesive [Tape] Itching and Rash  . Other Itching and Rash    Chloraprep  . Ace Inhibitors     hyperkalemia  . Losartan     hyperkalemia  . Chlorhexidine Itching and Rash   ROS: All pertinent positives or negatives as in HPI otherwise negative   Vital Signs: Filed Vitals:   10/13/15 1019  BP: 92/78  Pulse: 83  Weight: 240 lb 6.4 oz (109.045 kg)  SpO2: 96%    PHYSICAL EXAM: Well nourished, well developed, in no acute distress.   HEENT: normal Neck: JVP flat Cardiac:  Lateral PMI, normal S1, S2; RRR; no murmur, No S3L upper chest scar  ICD Lungs:  clear to auscultation bilaterally, no wheezing, rhonchi or rales Abd: obese, soft, nontender, nondistended Ext: warm tr lower extremity edema.    Skin: warm and dry Neuro:  CNs 2-12 intact, no focal abnormalities noted Psych: Normal affect  ASSESSMENT AND PLAN: 1. Chronic systolic CHF: Nonischemic cardiomyopathy.  Medtronic ICD. I reviewed echo today personally and EF remains 20-25% with moderately dilated LV.  NYHA III.  - Volume status ok on exam and by optivol. Continue torsemide 20 mg daily. Can hold if weight < 240.  - On carvedilol  12.5 mg twice a day. Dose recently reduced due to orthostasis.  - On dig 0.0625 mg daily. Check level today. - Continue spiro and losartan 12.5 daily. If k increases can add Patiromer - Check labs today. BP too low to push standard meds further. Will try Corlanor 5mg  bid if she can afford.  - Given weight and comorbidities has not been felt to be candidate for Tx/VAD 2. Conversion disorder: Followed by neurology  3. Morbid obesity: Needs to work at diet/exercise for weight loss.    Bensimhon, Daniel,MD 10:46 AM

## 2015-10-13 NOTE — Progress Notes (Signed)
Advanced Heart Failure Medication Review by a Pharmacist  Does the patient  feel that his/her medications are working for him/her?  yes  Has the patient been experiencing any side effects to the medications prescribed?  no  Does the patient measure his/her own blood pressure or blood glucose at home?  yes   Does the patient have any problems obtaining medications due to transportation or finances?   no  Understanding of regimen: good Understanding of indications: good Potential of compliance: good Patient understands to avoid NSAIDs. Patient understands to avoid decongestants.  Issues to address at subsequent visits: None   Pharmacist comments:  [name] is a pleasant 45 yo F presenting without a medication list but with good recall of her regimen including dosages. She reports good compliance with her medications and states that she only takes metolazone 1-2 times per month but if she needs additional diuresis will try an extra 20 mg of torsemide first. She also states that she has cut down on the amount of ibuprofen she is taking since our discussion of the risks. She also stated that she started a Mag O7 supplement which has a large amount of magnesium (1035 mg) and potassium citrate (99 mg) that she takes 3 of daily. Will monitor Mag and K levels cautiously.   [name]. [name], PharmD, BCPS, CPP Clinical Pharmacist Pager: [phone] Phone: [phone] 10/13/2015 10:44 AM      Time with patient: 10 minutes Preparation and documentation time: 2 minutes Total time: 12 minutes

## 2015-10-13 NOTE — Telephone Encounter (Signed)
Corlanor 5 mg BID PA approved by [employer] through 10/12/16.   [name]. [name], PharmD, BCPS, CPP Clinical Pharmacist Pager: [phone] Phone: [phone] 10/13/2015 3:29 PM

## 2015-10-13 NOTE — Patient Instructions (Signed)
Start Corlanor 5 mg Twice daily   Labs today  We will contact you in 3 months to schedule your next appointment.

## 2015-10-13 NOTE — Addendum Note (Signed)
Encounter addended by: Scarlette Calico, RN on: 10/13/2015 11:07 AM<BR>     Documentation filed: Dx Association, Patient Instructions Section, Orders

## 2015-10-14 ENCOUNTER — Telehealth: Payer: Self-pay | Admitting: Oncology

## 2015-10-14 NOTE — Telephone Encounter (Signed)
PAL - R/S FROM 5/16 TO 5/22. S/w patient she is aware.

## 2015-10-17 ENCOUNTER — Other Ambulatory Visit: Payer: Self-pay | Admitting: Cardiology

## 2015-10-20 ENCOUNTER — Encounter (HOSPITAL_COMMUNITY): Payer: Medicare Other

## 2015-10-20 ENCOUNTER — Encounter (HOSPITAL_COMMUNITY): Payer: Self-pay

## 2015-10-22 ENCOUNTER — Encounter (HOSPITAL_COMMUNITY)
Admission: RE | Admit: 2015-10-22 | Discharge: 2015-10-22 | Disposition: A | Discharge status: Home / Self Care | Payer: Medicare Other | Source: Ambulatory Visit | Attending: Orthopedic Surgery | Admitting: Orthopedic Surgery

## 2015-10-22 DIAGNOSIS — M545 Low back pain: Secondary | ICD-10-CM | POA: Diagnosis not present

## 2015-10-22 MED ORDER — TECHNETIUM TC 99M MEDRONATE IV KIT: 25.0000 | PACK | Freq: Once | INTRAVENOUS | Status: DC | PRN

## 2015-10-28 ENCOUNTER — Ambulatory Visit: Payer: Self-pay | Admitting: Oncology

## 2015-10-28 ENCOUNTER — Other Ambulatory Visit: Payer: Self-pay

## 2015-10-29 ENCOUNTER — Encounter: Payer: Self-pay | Admitting: Internal Medicine

## 2015-10-29 ENCOUNTER — Ambulatory Visit (INDEPENDENT_AMBULATORY_CARE_PROVIDER_SITE_OTHER): Payer: Medicare Other | Admitting: Internal Medicine

## 2015-10-29 VITALS — BP 90/68 | HR 85 | Ht 67.0 in | Wt 252.2 lb

## 2015-10-29 DIAGNOSIS — I429 Cardiomyopathy, unspecified: Secondary | ICD-10-CM

## 2015-10-29 DIAGNOSIS — Z95810 Presence of automatic (implantable) cardiac defibrillator: Secondary | ICD-10-CM | POA: Diagnosis not present

## 2015-10-29 DIAGNOSIS — I428 Other cardiomyopathies: Secondary | ICD-10-CM

## 2015-10-29 DIAGNOSIS — I5022 Chronic systolic (congestive) heart failure: Secondary | ICD-10-CM | POA: Diagnosis not present

## 2015-10-29 NOTE — Patient Instructions (Signed)
Medication Instructions:  Your physician recommends that you continue on your current medications as directed. Please refer to the Current Medication list given to you today.  Labwork: None ordered  Testing/Procedures: None ordered  Follow-Up: Remote monitoring is used to monitor your Pacemaker of ICD from home. This monitoring reduces the number of office visits required to check your device to one time per year. It allows [place] to keep an eye on the functioning of your device to ensure it is working properly. You are scheduled for a device check from home on 01/28/2016. You may send your transmission at any time that day. If you have a wireless device, the transmission will be sent automatically. After your physician reviews your transmission, you will receive a postcard with your next transmission date.  Your physician wants you to follow-up in: 1 year with Chanetta Marshall, NP. You will receive a reminder letter in the mail two months in advance. If you don't receive a letter, please call our office to schedule the follow-up appointment.  If you need a refill on your cardiac medications before your next appointment, please call your pharmacy.  Thank you for choosing [facility]!!

## 2015-10-29 NOTE — Progress Notes (Signed)
Patient Care Team: Golden Circle, FNP as PCP - General (Family Medicine) Jolaine Artist, MD as Consulting Physician (Cardiology) Chauncey Cruel, MD as Consulting Physician (Oncology) Deboraha Sprang, MD as Consulting Physician (Cardiology) Larey Seat, MD as Consulting Physician (Neurology) Chesley Mires, MD as Consulting Physician (Pulmonary Disease)   HPI  [name] is a 45 y.o. female Seen in followup for an ICD implanted in May 2013 for primary prevention with nonischemic cardiomyopathy and a QRS  She is doing relatively well with stable shortness of breath. She does not have peripheral edema.  y.  Echocardiogram 5/17 EF 20-25% with mild RV dysfunction  She has significant fatigue and dizziness;  she has been diagnosed with a conversion disorder which placed having with her life.   Past Medical History  Diagnosis Date  . CHF (congestive heart failure) (HCC)     due to non-ischemic cardiomyopathy, thought to be chemotherapy induced;  cath 7/12: normal cors, EF 20-25%. Cardiac MRI 05/2011 EF 32%. ICD implantation 10/2011 (Medtronic)  . Peripheral neuropathy (HCC)     chemo- induced  . Hypertension     c/b orthostatic hypotention  . Depression   . Palpitation     normal sinus rhythm only on 21 day heart monitor  . Obesity   . Fibromyalgia   . GERD (gastroesophageal reflux disease)   . Breast calcifications     right breast  . Interstitial cystitis   . Anxiety   . Nonischemic cardiomyopathy ([place])     related to chemo; EF 30% 05/2011  . ICD (implantable cardiac defibrillator) in place   . Pneumonia 2000's    "once"  . Type II diabetes mellitus ([place])   . Blood transfusion 1980's    1987 or 1988  . Anemia 1980's    [id number]  . History of stomach ulcers   . Breast cancer ([place])     right;  . Conversion disorder   . 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   . 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 Laparoscopic endometriosis fulguration  1990's  . Cardiac defibrillator placement  11/03/11  . Tonsillectomy  1980's  . Tubal ligation  1990's  . Vaginal hysterectomy  2000's  . Cholecystectomy  ~ 2009  . Breast lumpectomy  2008; 2013    right  . Port-a-cath removal  2010?    left chest; placed in 2008  . Nasal sinus surgery    . Implantable cardioverter defibrillator implant N/A 11/03/2011    Procedure: IMPLANTABLE CARDIOVERTER DEFIBRILLATOR IMPLANT;  Surgeon: Deboraha Sprang, MD; MDT     Current Outpatient Prescriptions  Medication Sig Dispense Refill  . albuterol (PROVENTIL HFA;VENTOLIN HFA) 108 (90 Base) MCG/ACT inhaler Inhale 1-2 puffs into the lungs daily.    . hydrOXYzine (ATARAX/VISTARIL) 25 MG tablet Take 25 mg by mouth at bedtime as needed for itching (DOESN'T TAKE WITH TRAZODONE).    Marland Kitchen acetaminophen (TYLENOL) 325 MG tablet Take 2 tablets (650 mg total) by mouth every 4 (four) hours as needed for headache or mild pain.    Marland Kitchen ALPRAZolam (XANAX) 1 MG tablet Take 1 mg by mouth at bedtime as needed for sleep.    . baclofen (LIORESAL) 10 MG tablet TAKE 1 TABLET BY MOUTH 2 (TWO) TIMES DAILY AS NEEDED FOR MUSCLE SPASMS.  60 tablet 0  . beclomethasone (QVAR) 80 MCG/ACT inhaler Inhale 2 puffs into the lungs 2 (two) times daily.     . carvedilol (COREG) 25 MG tablet Take 0.5 tablets (12.5 mg total) by mouth 2 (two) times daily with a meal. 60 tablet 6  . Cholecalciferol (VITAMIN D3) 5000 UNITS CAPS Take 1 capsule by mouth daily.     . Coenzyme Q10 (CO Q-10) 100 MG CAPS Take 100 mg by mouth daily.    . Cyanocobalamin 2500 MCG SUBL Place 2,500 mcg under the tongue daily.    . digoxin (LANOXIN) 0.125 MG tablet Take 0.125 mg by mouth daily.    . divalproex (DEPAKOTE ER) 250 MG 24 hr tablet Take 250 mg by mouth daily.    . ferrous sulfate 325  (65 FE) MG tablet Take 325 mg by mouth daily with breakfast.    . GLUCOSAMINE-CHONDROITIN PO Take 1,200 mg by mouth daily.    Marland Kitchen ibuprofen (ADVIL,MOTRIN) 200 MG tablet Take 800 mg by mouth 2 (two) times daily as needed for moderate pain.     . ivabradine (CORLANOR) 5 MG TABS tablet Take 1 tablet (5 mg total) by mouth 2 (two) times daily with a meal. 60 tablet 6  . loratadine (CLARITIN) 10 MG tablet Take 10 mg by mouth daily.    Marland Kitchen losartan (COZAAR) 25 MG tablet TAKE 0.5 TABLETS (12.5 MG TOTAL) BY MOUTH DAILY. 15 tablet 3  . MAG ASPART-POTASSIUM ASPART PO Take 3 tablets by mouth daily.    . metolazone (ZAROXOLYN) 2.5 MG tablet Take 2.5 mg by mouth daily as needed (edema).    . Multiple Vitamin (MULTIVITAMIN WITH MINERALS) TABS Take 1 tablet by mouth daily. Reported on 10/13/2015    . omeprazole (PRILOSEC) 20 MG capsule TAKE 1 CAPSULE BY MOUTH DAILY. 30 capsule 5  . PATADAY 0.2 % SOLN Place 1 drop into both eyes daily.     . pentosan polysulfate (ELMIRON) 100 MG capsule Take 200 mg by mouth daily.    . potassium chloride SA (K-DUR,KLOR-CON) 20 MEQ tablet Take 20 mEq by mouth daily.    . pregabalin (LYRICA) 200 MG capsule Take 1 capsule (200 mg total) by mouth 2 (two) times daily. 60 capsule 5  . spironolactone (ALDACTONE) 25 MG tablet Take 0.5 tablets (12.5 mg total) by mouth daily. 15 tablet 6  . torsemide (DEMADEX) 20 MG tablet Take 1 tablet (20 mg total) by mouth daily. 30 tablet 1  . traMADol (ULTRAM) 50 MG tablet Take 50 mg by mouth daily as needed for moderate pain.     . traZODone (DESYREL) 50 MG tablet Take 50 mg by mouth as needed. FOR SLEEP     No current facility-administered medications for this visit.    Allergies  Allergen Reactions  . Reglan [Metoclopramide Hcl] Anxiety  . Adhesive [Tape] Itching and Rash  . Other Itching and Rash    Chloraprep  . Ace Inhibitors     hyperkalemia  . Losartan     hyperkalemia  . Chlorhexidine Itching and Rash    Review of Systems negative  except from HPI and PMH  Physical Exam BP 90/68 mmHg  Pulse 85  Ht 5\' 7"  (1.702 m)  Wt 252 lb 3.2 oz (114.397 kg)  BMI 39.49 kg/m2 Well developed and morbildy in no acute distress HENT normal E scleral and icterus clear Neck Supple JVP flat; carotids brisk and full Clear to ausculation  Device pocket well healed; without hematoma or erythema.  There  is no tethering Regular rate and rhythm, no murmurs gallops or rub Soft with active bowel sounds No clubbing cyanosis none Edema Alert and oriented, grossly normal motor and sensory function Skin Warm and Dry  ECG demonstrates sinus rhythm at 85 20/10/42  Assessment and  Plan  Nonischemic Cardiomyopathy  Guideline directed medical therapy  To be started on ivabradine  CHF chronic systolic Euvolemic continue current meds  ICD The patient's device was interrogated.  The information was reviewed. No changes were made in the programming.       Euvolemic continue current meds

## 2015-10-30 LAB — CUP PACEART INCLINIC DEVICE CHECK
Battery Voltage: 3.12 V
Brady Statistic RV Percent Paced: 0 %
Date Time Interrogation Session: 20170517190245
HIGH POWER IMPEDANCE MEASURED VALUE: 361 Ohm
HighPow Impedance: 57 Ohm
Implantable Lead Implant Date: 20130522
Implantable Lead Model: 181
Implantable Lead Serial Number: 318396
Lead Channel Setting Pacing Pulse Width: 0.4 ms
Lead Channel Setting Sensing Sensitivity: 0.3 mV
MDC IDC LEAD LOCATION: 753860
MDC IDC MSMT LEADCHNL RV IMPEDANCE VALUE: 361 Ohm
MDC IDC MSMT LEADCHNL RV PACING THRESHOLD AMPLITUDE: 1.25 V
MDC IDC MSMT LEADCHNL RV PACING THRESHOLD PULSEWIDTH: 0.4 ms
MDC IDC MSMT LEADCHNL RV SENSING INTR AMPL: 14.25 mV
MDC IDC SET LEADCHNL RV PACING AMPLITUDE: 2.25 V

## 2015-11-03 ENCOUNTER — Ambulatory Visit: Payer: Self-pay | Admitting: Oncology

## 2015-11-03 ENCOUNTER — Other Ambulatory Visit: Payer: Self-pay

## 2015-11-03 ENCOUNTER — Telehealth: Payer: Self-pay | Admitting: Oncology

## 2015-11-03 NOTE — Telephone Encounter (Signed)
Mailed pt appt sched and letter

## 2015-11-03 NOTE — Progress Notes (Signed)
No show

## 2015-11-06 ENCOUNTER — Ambulatory Visit: Payer: Self-pay

## 2015-11-11 ENCOUNTER — Telehealth: Payer: Self-pay | Admitting: Internal Medicine

## 2015-11-11 NOTE — Telephone Encounter (Signed)
Rec'd from [facility] forward 10 pages to [place]

## 2015-11-24 ENCOUNTER — Other Ambulatory Visit (HOSPITAL_COMMUNITY): Payer: Self-pay | Admitting: *Deleted

## 2015-11-24 MED ORDER — LOSARTAN POTASSIUM 25 MG PO TABS: ORAL_TABLET | ORAL | Status: DC

## 2015-11-25 ENCOUNTER — Other Ambulatory Visit: Payer: Self-pay | Admitting: *Deleted

## 2015-11-25 ENCOUNTER — Encounter: Payer: Self-pay | Admitting: Obstetrics and Gynecology

## 2015-11-28 ENCOUNTER — Ambulatory Visit: Payer: Self-pay

## 2015-12-01 ENCOUNTER — Other Ambulatory Visit (HOSPITAL_COMMUNITY): Payer: Self-pay | Admitting: *Deleted

## 2015-12-01 ENCOUNTER — Telehealth: Payer: Self-pay

## 2015-12-01 MED ORDER — SPIRONOLACTONE 25 MG PO TABS: 12.5000 mg | ORAL_TABLET | Freq: Every day | ORAL | Status: DC

## 2015-12-01 MED ORDER — LOSARTAN POTASSIUM 25 MG PO TABS: ORAL_TABLET | ORAL | Status: DC

## 2015-12-01 NOTE — Telephone Encounter (Signed)
Received a refill request for pt from [facility] for depakote 250 mg ER, sig: take 1 tablet by mouth daily. It appears that the last prescribed depakote dose was to be taken BID. The dose was changed (not by [facility]) in January of 2017 to be taken daily.  I called pt to discuss. No answer, left a message asking her to call me back.  Pt also needs a follow up appt with Dr. Brett Fairy.

## 2015-12-02 ENCOUNTER — Other Ambulatory Visit: Payer: Self-pay

## 2015-12-02 MED ORDER — TORSEMIDE 20 MG PO TABS: 20.0000 mg | ORAL_TABLET | Freq: Every day | ORAL | Status: DC

## 2015-12-10 ENCOUNTER — Other Ambulatory Visit: Payer: Self-pay | Admitting: Family

## 2015-12-22 ENCOUNTER — Telehealth: Payer: Self-pay

## 2015-12-22 ENCOUNTER — Encounter: Payer: Self-pay | Admitting: Internal Medicine

## 2015-12-22 NOTE — Telephone Encounter (Signed)
Rec'd from [facility] forwarded 12 pages to GI Historical Provider

## 2016-01-01 ENCOUNTER — Telehealth (HOSPITAL_COMMUNITY): Payer: Self-pay | Admitting: Vascular Surgery

## 2016-01-01 NOTE — Telephone Encounter (Signed)
Pt called she wants to speak [place] or Nurse about a new medication that was discussed when pt was here in May pt does not know th name of the medication it is suppose to help w/ EF, pt states Jacob Moores was suppose to call to get pt asst but she never heard from anyone.. Please advise

## 2016-01-01 NOTE — Telephone Encounter (Signed)
[name], please f/u with pt

## 2016-01-05 NOTE — Telephone Encounter (Signed)
Spoke with [facility] who stated that it was not going through her insurance. The PA was approved on 5/1 so I asked them to re-process and it went through. They verified that they would process it through [employer] so that her copay will be $0. They do not have it in stock today so they are ordering it for tomorrow. Left VM for patient with info.   [name]. [name], PharmD, BCPS, CPP Clinical Pharmacist Pager: [phone] Phone: [phone] 01/05/2016 9:38 AM

## 2016-01-15 ENCOUNTER — Ambulatory Visit
Admission: RE | Admit: 2016-01-15 | Discharge: 2016-01-15 | Disposition: A | Discharge status: Home / Self Care | Payer: Medicare Other | Source: Ambulatory Visit

## 2016-01-15 DIAGNOSIS — Z1231 Encounter for screening mammogram for malignant neoplasm of breast: Secondary | ICD-10-CM

## 2016-01-19 ENCOUNTER — Emergency Department (HOSPITAL_COMMUNITY): Payer: Medicare Other

## 2016-01-19 ENCOUNTER — Encounter (HOSPITAL_COMMUNITY): Payer: Self-pay | Admitting: *Deleted

## 2016-01-19 ENCOUNTER — Emergency Department (HOSPITAL_COMMUNITY)
Admission: EM | Admit: 2016-01-19 | Discharge: 2016-01-19 | Disposition: A | Discharge status: Home / Self Care | Payer: Medicare Other | Attending: Emergency Medicine | Admitting: Emergency Medicine

## 2016-01-19 DIAGNOSIS — Z79899 Other long term (current) drug therapy: Secondary | ICD-10-CM | POA: Diagnosis not present

## 2016-01-19 DIAGNOSIS — R1011 Right upper quadrant pain: Secondary | ICD-10-CM | POA: Insufficient documentation

## 2016-01-19 DIAGNOSIS — I110 Hypertensive heart disease with heart failure: Secondary | ICD-10-CM | POA: Insufficient documentation

## 2016-01-19 DIAGNOSIS — I509 Heart failure, unspecified: Secondary | ICD-10-CM | POA: Diagnosis not present

## 2016-01-19 DIAGNOSIS — M7989 Other specified soft tissue disorders: Secondary | ICD-10-CM | POA: Insufficient documentation

## 2016-01-19 DIAGNOSIS — R197 Diarrhea, unspecified: Secondary | ICD-10-CM | POA: Diagnosis not present

## 2016-01-19 DIAGNOSIS — R1013 Epigastric pain: Secondary | ICD-10-CM | POA: Diagnosis not present

## 2016-01-19 DIAGNOSIS — J449 Chronic obstructive pulmonary disease, unspecified: Secondary | ICD-10-CM | POA: Insufficient documentation

## 2016-01-19 DIAGNOSIS — R110 Nausea: Secondary | ICD-10-CM | POA: Insufficient documentation

## 2016-01-19 DIAGNOSIS — Z791 Long term (current) use of non-steroidal anti-inflammatories (NSAID): Secondary | ICD-10-CM | POA: Insufficient documentation

## 2016-01-19 DIAGNOSIS — E119 Type 2 diabetes mellitus without complications: Secondary | ICD-10-CM | POA: Diagnosis not present

## 2016-01-19 LAB — CBC
HCT: 38.2 % (ref 36.0–46.0)
HEMOGLOBIN: 13.2 g/dL (ref 12.0–15.0)
MCH: 32.3 pg (ref 26.0–34.0)
MCHC: 34.6 g/dL (ref 30.0–36.0)
MCV: 93.4 fL (ref 78.0–100.0)
PLATELETS: 118 10*3/uL — AB (ref 150–400)
RBC: 4.09 MIL/uL (ref 3.87–5.11)
RDW: 14.3 % (ref 11.5–15.5)
WBC: 5.1 10*3/uL (ref 4.0–10.5)

## 2016-01-19 LAB — COMPREHENSIVE METABOLIC PANEL
ALBUMIN: 4.1 g/dL (ref 3.5–5.0)
ALK PHOS: 110 U/L (ref 38–126)
ALT: 15 U/L (ref 14–54)
AST: 25 U/L (ref 15–41)
Anion gap: 10 (ref 5–15)
BUN: 21 mg/dL — ABNORMAL HIGH (ref 6–20)
CHLORIDE: 98 mmol/L — AB (ref 101–111)
CO2: 25 mmol/L (ref 22–32)
CREATININE: 0.9 mg/dL (ref 0.44–1.00)
Calcium: 8.7 mg/dL — ABNORMAL LOW (ref 8.9–10.3)
GFR calc non Af Amer: >60 mL/min (ref >60)
GLUCOSE: 116 mg/dL — AB (ref 65–99)
Potassium: 3.7 mmol/L (ref 3.5–5.1)
Sodium: 133 mmol/L — ABNORMAL LOW (ref 135–145)
Total Bilirubin: 2.9 mg/dL — ABNORMAL HIGH (ref 0.3–1.2)
Total Protein: 7.9 g/dL (ref 6.5–8.1)

## 2016-01-19 LAB — LIPASE, BLOOD: Lipase: 20 U/L (ref 11–51)

## 2016-01-19 MED ORDER — ONDANSETRON 4 MG PO TBDP: 4.0000 mg | ORAL_TABLET | Freq: Three times a day (TID) | ORAL | 0 refills | Status: DC | PRN

## 2016-01-19 MED ORDER — ONDANSETRON 8 MG PO TBDP: 8.0000 mg | ORAL_TABLET | Freq: Once | ORAL | Status: DC

## 2016-01-19 MED ORDER — HYDROMORPHONE HCL 1 MG/ML IJ SOLN
0.5000 mg | Freq: Once | INTRAMUSCULAR | Status: AC
  Administered 2016-01-19: 0.5 mg via INTRAVENOUS
  Filled 2016-01-19: qty 1

## 2016-01-19 MED ORDER — DIPHENHYDRAMINE HCL 25 MG PO CAPS
25.0000 mg | ORAL_CAPSULE | Freq: Once | ORAL | Status: AC
  Administered 2016-01-19: 25 mg via ORAL
  Filled 2016-01-19: qty 1

## 2016-01-19 MED ORDER — DIPHENHYDRAMINE HCL 50 MG/ML IJ SOLN
25.0000 mg | Freq: Once | INTRAMUSCULAR | Status: AC
  Administered 2016-01-19: 25 mg via INTRAVENOUS
  Filled 2016-01-19: qty 1

## 2016-01-19 MED ORDER — ONDANSETRON HCL 4 MG/2ML IJ SOLN
4.0000 mg | Freq: Once | INTRAMUSCULAR | Status: AC
  Administered 2016-01-19: 4 mg via INTRAVENOUS
  Filled 2016-01-19: qty 2

## 2016-01-19 MED ORDER — OXYCODONE HCL 5 MG PO TABS: 5.0000 mg | ORAL_TABLET | ORAL | 0 refills | Status: DC | PRN

## 2016-01-19 MED ORDER — FENTANYL CITRATE (PF) 100 MCG/2ML IJ SOLN
100.0000 ug | Freq: Once | INTRAMUSCULAR | Status: AC
  Administered 2016-01-19: 100 ug via INTRAVENOUS
  Filled 2016-01-19: qty 2

## 2016-01-19 NOTE — Discharge Instructions (Signed)
Please follow up with primary care doctor. Keep appointment with GI doctor and cardiologist for September. Take pain medication and zofran as needed.

## 2016-01-19 NOTE — ED Triage Notes (Signed)
Patient c/o epigastric with nausea x18 months.  Patient states pain radiates around to back.  Patient has been evaluated by Dr. Collene Mares and PCP, but was not happy with Dr. Collene Mares.  Patient requested and received referral to [facility] and has 02/23/16 appointment.  Patient also c/o right shoulder pain.  Patient is a breast cancer survivor, last chemo 2008.  CMP last year indicated elevated liver enzymes.  A CT scan was done last year as well and indicated fatty liver and non-alcoholic cirrhosis.

## 2016-01-19 NOTE — ED Notes (Signed)
Pt knows of the need for urine. Will attempt when she has more fluids.

## 2016-01-19 NOTE — ED Notes (Addendum)
Patient transported to CT 

## 2016-01-19 NOTE — ED Provider Notes (Signed)
[facility] DEPT Provider Note   CSN: [id number] Arrival date & time: 01/19/16  1318  First Provider Contact:  First MD Initiated Contact with Patient 01/19/16 1805        History   Chief Complaint Chief Complaint  Patient presents with  . Abdominal Pain  . Nausea    HPI [name] is a 45 y.o. female.  45 yo AA female with complex pmh including breast cancer with chemo in 2008, CHF with EF of 20-25% in March 2017 with an ICD, fatty liver non-alcoholic cirrhosis, conversion disorder, fibromyalgia, GERD, HTN, DM, and obesity that presents today with abd pain and nausea. Patient states that this has been occurring for the past year. She was dx with fatty liver non alcoholic cirrhosis after a CT Scan preformed by her oncologist in Oct of 2016 after she had elevated liver enzymes. She followed up with the liver specialist and they were going to monitor her labs. She has continued to have abd that that has gotten worse. She states the pain radiates to her back and right shoulder. She rates the pain a 10/10 that is constant. Nothing makes better or worse. She saw Dr. Collene Mares and was told she needed a colonoscopy that she could not afford. She has an appointment scheduled at [facility] on 02/23/16. She endorses nausea w/o vomiting and light colored stools.   She denies any fever, chills, cp, sob, emesis, change in bowel habits, hematochezia, melena, urinary symptoms. Patient states that she has had edema in her legs. Patient is on lasix and is followed by cardiologist.    The history is provided by the patient.    Past Medical History:  Diagnosis Date  . Anemia 1980's   [id number]  . Anxiety   . 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 Blood transfusion 1980's   1987 or 1988  . Breast calcifications    right breast  . Breast cancer (HCC)    right;  . CHF (congestive heart failure) ([place])    due to non-ischemic cardiomyopathy, thought to be chemotherapy induced;  cath 7/12: normal cors, EF 20-25%.  Cardiac MRI 05/2011 EF 32%. ICD implantation 10/2011 ([employer])  . 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 Conversion disorder   . 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 Depression   . Fibromyalgia   . GERD (gastroesophageal reflux disease)   . History of stomach ulcers   . Hypertension    c/b orthostatic hypotention  . ICD (implantable cardiac defibrillator) in place   . Interstitial cystitis   . 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   . Nonischemic cardiomyopathy ([place])    related to chemo; EF 30% 05/2011  . Obesity   . Palpitation    normal sinus rhythm only on 21 day heart monitor  . Peripheral neuropathy (HCC)    chemo- induced  . Pneumonia 2000's   "once"  . Type II diabetes mellitus [facility])     Patient Active Problem List   Diagnosis Date Noted  . Conversion disorder with abnormal movement 12/25/2014  . Convulsions/seizures ([place]) 12/25/2014  . Myoclonus 12/20/2014  . Acute renal insufficiency 11/28/2014  . Acute on chronic systolic CHF (congestive heart failure) ([place]) 11/27/2014  . ICD in place (MDT 2013) 11/27/2014  . Near syncope 11/26/2014  . Stomach discomfort 10/14/2014  . Generalized anxiety disorder 07/08/2014  . Physical deconditioning 06/24/2014  . Breast cancer, right breast ([place]) 05/29/2014  . Fibromyalgia affecting multiple sites 05/29/2014  .  Chronic pain not due to malignancy 05/29/2014  . Dyspnea 01/07/2014  . Chronic sinusitis 01/07/2014  . Conversion disorder 09/03/2013  . 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 Uncontrolled persistent asthma 11/15/2012  . Orthostatic hypotension 05/18/2012  . Keloid 03/14/2012  . Single implantable cardioverter-defibrillator in situ 11/03/2011  . Unspecified vitamin D deficiency 08/19/2011  . Weakness 07/15/2011  . Chest pain 12/09/2010  . GERD  (gastroesophageal reflux disease) 12/09/2010  . Secondary cardiomyopathy s/p chemo therapy 04/01/2009  . FATIGUE / MALAISE 10/02/2008  . SYSTOLIC HEART FAILURE, CHRONIC 08/25/2008  . Obesity-BMI 38 04/29/2008  . Essential hypertension 04/29/2008  . [facility]ULT[facility] 04/29/2008  . DIZZINESS 04/29/2008  . NUMBNESS 04/29/2008  . EDEMA 04/29/2008  . ABNORMAL WEIGHT GAIN 04/29/2008  . PALPITATIONS 04/29/2008  . ORTHOPNEA 04/29/2008  . DYSPNEA 04/29/2008  . COUGH 04/29/2008    Past Surgical History:  Procedure Laterality Date  . BREAST LUMPECTOMY  2008; 2013   right  . CARDIAC DEFIBRILLATOR PLACEMENT  11/03/11  . CHOLECYSTECTOMY  ~ 2009  . IMPLANTABLE CARDIOVERTER DEFIBRILLATOR IMPLANT N/A 11/03/2011   Procedure: IMPLANTABLE CARDIOVERTER DEFIBRILLATOR IMPLANT;  Surgeon: Deboraha Sprang, MD; MDT   . LAPAROSCOPIC ENDOMETRIOSIS FULGURATION  1990's  . NASAL SINUS SURGERY    . [place] REMOVAL  2010?   left chest; placed in 2008  . TONSILLECTOMY  1980's  . TUBAL LIGATION  1990's  . VAGINAL HYSTERECTOMY  2000's    OB History    No data available       Home Medications    Prior to Admission medications   Medication Sig Start Date End Date Taking? Authorizing Provider  albuterol (PROVENTIL HFA;VENTOLIN HFA) 108 (90 Base) MCG/ACT inhaler Inhale 1-2 puffs into the lungs daily.   Yes Historical Provider, MD  beclomethasone (QVAR) 80 MCG/ACT inhaler Inhale 2 puffs into the lungs 2 (two) times daily.    Yes Historical Provider, MD  carvedilol (COREG) 25 MG tablet Take 0.5 tablets (12.5 mg total) by mouth 2 (two) times daily with a meal. 11/27/14  Yes Erlene Quan, PA-C  Cholecalciferol (VITAMIN D3) 5000 UNITS CAPS Take 1 capsule by mouth daily.    Yes Historical Provider, MD  Coenzyme Q10 (CO Q-10) 100 MG CAPS Take 100 mg by mouth daily.   Yes Historical Provider, MD  Cyanocobalamin 2500 MCG SUBL Place 2,500 mcg under the tongue daily.   Yes Historical Provider, MD  digoxin  (LANOXIN) 0.125 MG tablet Take 0.125 mg by mouth daily.   Yes Historical Provider, MD  divalproex (DEPAKOTE ER) 250 MG 24 hr tablet Take 250 mg by mouth every evening.    Yes Historical Provider, MD  ferrous sulfate 325 (65 FE) MG tablet Take 325 mg by mouth daily with breakfast.   Yes Historical Provider, MD  GLUCOSAMINE-CHONDROITIN PO Take 1,200 mg by mouth daily.   Yes Historical Provider, MD  hydrOXYzine (ATARAX/VISTARIL) 25 MG tablet Take 25 mg by mouth at bedtime as needed for itching (DOESN'T TAKE WITH TRAZODONE).   Yes Historical Provider, MD  ivabradine (CORLANOR) 5 MG TABS tablet Take 1 tablet (5 mg total) by mouth 2 (two) times daily with a meal. 10/13/15  Yes Jolaine Artist, MD  loratadine (CLARITIN) 10 MG tablet Take 10 mg by mouth daily.   Yes Historical Provider, MD  losartan (COZAAR) 25 MG tablet TAKE 0.5 TABLETS (12.5 MG TOTAL) BY MOUTH DAILY. 12/01/15  Yes Shaune Pascal  Bensimhon, MD  MAG ASPART-POTASSIUM ASPART PO Take 3 tablets by mouth daily.   Yes Historical Provider, MD  metolazone (ZAROXOLYN) 2.5 MG tablet Take 2.5 mg by mouth daily as needed (edema).   Yes Historical Provider, MD  Multiple Vitamin (MULTIVITAMIN WITH MINERALS) TABS Take 1 tablet by mouth daily. Reported on 10/13/2015   Yes Historical Provider, MD  omeprazole (PRILOSEC) 20 MG capsule TAKE 1 CAPSULE BY MOUTH DAILY. 06/23/15  Yes Golden Circle, FNP  PATADAY 0.2 % SOLN Place 1 drop into both eyes daily.  01/22/13  Yes Historical Provider, MD  pentosan polysulfate (ELMIRON) 100 MG capsule Take 200 mg by mouth daily.   Yes Historical Provider, MD  potassium chloride SA (K-DUR,KLOR-CON) 20 MEQ tablet Take 20 mEq by mouth daily.   Yes Historical Provider, MD  pregabalin (LYRICA) 200 MG capsule Take 1 capsule (200 mg total) by mouth 2 (two) times daily. Patient taking differently: Take 200 mg by mouth every evening.  12/25/14  Yes Carmen Dohmeier, MD  spironolactone (ALDACTONE) 25 MG tablet Take 0.5 tablets (12.5 mg total)  by mouth daily. 12/01/15  Yes Amy D Clegg, NP  torsemide (DEMADEX) 20 MG tablet Take 1 tablet (20 mg total) by mouth daily. 12/02/15  Yes Jolaine Artist, MD  acetaminophen (TYLENOL) 325 MG tablet Take 2 tablets (650 mg total) by mouth every 4 (four) hours as needed for headache or mild pain. 11/27/14   Erlene Quan, PA-C  ALPRAZolam Duanne Moron) 1 MG tablet Take 1 mg by mouth at bedtime as needed for sleep.    Historical Provider, MD  baclofen (LIORESAL) 10 MG tablet TAKE 1 TABLET BY MOUTH 2 (TWO) TIMES DAILY AS NEEDED FOR MUSCLE SPASMS. 08/14/15   Golden Circle, FNP  ibuprofen (ADVIL,MOTRIN) 200 MG tablet Take 800 mg by mouth 2 (two) times daily as needed for moderate pain.     Historical Provider, MD  traMADol (ULTRAM) 50 MG tablet Take 50 mg by mouth daily as needed for moderate pain.  10/31/14   Historical Provider, MD  traZODone (DESYREL) 50 MG tablet Take 50 mg by mouth as needed. FOR SLEEP 10/29/14   Historical Provider, MD    Family History Family History  Problem Relation Age of Onset  . Heart disease Mother   . Coronary artery disease    . Heart attack    . Heart disease Maternal Uncle   . Cancer Paternal Uncle     colon  . Heart disease Maternal Grandmother   . Cancer Paternal Grandmother     ovarian    Social History Social History  Substance Use Topics  . Smoking status: Never Smoker  . Smokeless tobacco: Never Used  . Alcohol use Yes     Comment: 11/03/11 "maybe once a month"     Allergies   Reglan [metoclopramide hcl]; Adhesive [tape]; Other; Ace inhibitors; Losartan; and Chlorhexidine   Review of Systems Review of Systems  Constitutional: Negative for activity change, appetite change, chills, fever and unexpected weight change.  HENT: Positive for postnasal drip.   Eyes: Negative.   Respiratory: Negative for cough and shortness of breath.   Cardiovascular: Positive for leg swelling (Stable). Negative for chest pain and palpitations.  Gastrointestinal: Positive  for abdominal distention, abdominal pain, diarrhea and nausea. Negative for blood in stool, constipation and vomiting.  Genitourinary: Negative for dysuria, flank pain, frequency, hematuria and urgency.  Musculoskeletal: Negative.   Skin: Negative.   Neurological: Negative for dizziness, syncope, weakness, light-headedness and headaches.  Hematological: Negative.   All other systems reviewed and are negative.    Physical Exam Updated Vital Signs BP 109/61 (BP Location: Left Arm)   Pulse 66   Resp 17   SpO2 96%   Physical Exam  Constitutional: She appears well-developed and well-nourished. No distress.  HENT:  Head: Normocephalic and atraumatic.  Eyes: Pupils are equal, round, and reactive to light. No scleral icterus.  Neck: Normal range of motion. Neck supple.  No JVD appreciated on exam.  Cardiovascular: Normal rate, regular rhythm, normal heart sounds and intact distal pulses.   Pulmonary/Chest: Effort normal and breath sounds normal. No respiratory distress. She has no wheezes. She has no rales. She exhibits no tenderness.  Abdominal: Soft. Bowel sounds are normal. She exhibits no shifting dullness, no fluid wave, no abdominal bruit, no ascites and no mass. There is hepatomegaly. There is tenderness in the right upper quadrant and epigastric area. There is no rigidity, no rebound and no guarding.  Musculoskeletal:  Trace pitting edema noted in lower extremities bilaterally.  Lymphadenopathy:    She has no cervical adenopathy.  Neurological: She is alert.  Skin: Skin is warm and dry. Capillary refill takes less than 2 seconds.     ED Treatments / Results  Labs (all labs ordered are listed, but only abnormal results are displayed) Labs Reviewed  CBC - Abnormal; Notable for the following:       Result Value   Platelets 118 (*)    All other components within normal limits  COMPREHENSIVE METABOLIC PANEL - Abnormal; Notable for the following:    Sodium 133 (*)    Chloride  98 (*)    Glucose, Bld 116 (*)    BUN 21 (*)    Calcium 8.7 (*)    Total Bilirubin 2.9 (*)    All other components within normal limits  LIPASE, BLOOD  URINALYSIS, ROUTINE W REFLEX MICROSCOPIC (NOT AT [facility])    EKG  EKG Interpretation None       Radiology Ct Abdomen Pelvis Wo Contrast  Result Date: 01/19/2016 CLINICAL DATA:  Epigastric pain with nausea for 18 months. Elevated liver enzymes. History of breast cancer treated in 2008. EXAM: CT ABDOMEN AND PELVIS WITHOUT CONTRAST TECHNIQUE: Multidetector CT imaging of the abdomen and pelvis was performed following the standard protocol without IV contrast. COMPARISON:  04/01/2015 FINDINGS: Lower chest: Heart is enlarged. Pacemaker lead to right ventricle. Small pericardial effusion is partially imaged. Hepatobiliary: A small low-attenuation lesion is identified within the dome of the right hepatic lobe, stable in appearance and consistent with benign cyst, measuring 9 mm. No new liver lesions are identified. Caudate lobe is prominent. Nodular surface of the liver consistent with cirrhosis. Diffuse the liver is enlarged and diffusely low-attenuation consistent with hepatic steatosis. Gallbladder surgically absent. Pancreas: Normal noncontrast appearance. There is a small amount of fluid in the peripancreatic region. However the appearance is nonspecific as fluid is also identified in the mesenteric root, paracolic regions, and pelvis. Spleen: Normal noncontrast appearance.  Normal size. Renal/Adrenal: Normal appearance of the adrenal glands. Normal appearance of the kidneys. No hydronephrosis or renal mass. Gastrointestinal tract: The stomach and small bowel loops are normal in appearance. The appendix is not well seen. Colonic loops are normal in appearance. Reproductive/Pelvis: The uterus is surgically absent. There is a small amount of free pelvic fluid. No adnexal mass. Vascular/Lymphatic: The aorta is normal in appearance. The inferior vena cava is  distended, likely from right heart failure. Nonspecific retroperitoneal lymph nodes  are noted, measuring on the order of 1-2 cm. This appears to be or stable finding. Musculoskeletal/Abdominal wall: Visualized osseous structures have a normal appearance. There is asymmetry of the breast shadows, left greater than right, consistent with prior right breast surgery. Small paraumbilical fat containing hernia is present. Other: Small amounts of fluid are identified within the retroperitoneum, central mesentery, paracolic gutters, and pelvis. The appearance is not localized. However, given the stranding in the retroperitoneum, pancreatitis should be considered. Anasarca could have a similar appearance however. IMPRESSION: 1. Nonspecific changes in the retroperitoneum, of mesenteric root, and peritoneal cavity. Consider pancreatitis or anasarca. 2. No bowel obstruction. 3. Enlarged fatty liver. Stable right hepatic lobe lesion, likely a cyst. 4. Question of cirrhosis. 5. Cardiomegaly, of small pericardial effusion, and probable right heart failure ( dilated inferior vena cava). 6. Status post cholecystectomy. 7. Status post hysterectomy. Electronically Signed   By: Nolon Nations M.D.   On: 01/19/2016 21:21    Procedures Procedures (including critical care time)  Medications Ordered in ED Medications  HYDROmorphone (DILAUDID) injection 0.5 mg (not administered)  ondansetron (ZOFRAN-ODT) disintegrating tablet 8 mg (not administered)     Initial Impression / Assessment and Plan / ED Course  I have reviewed the triage vital signs and the nursing notes.  Pertinent labs & imaging results that were available during my care of the patient were reviewed by me and considered in my medical decision making (see chart for details).  Clinical Course  1805 Patient seen and examined by provider. Lab reviewed. Pain medicine, Zofran and CT ordered. Discussed plan of care with Dr. Lita Mains.  8:11 PM Nursed informed  me that patient had some pruritis with the dilaudid. Ordered benadryl. The dilaudid did not help her pain, requesting something else. Fentanyl was ordered.  9:00 PM: Patient states pain was better.   930: Ct results reviewed with no acute findings. Plan of care was discussed with Dr. Lita Mains. Discussed plan with patient. Still having pruritis after fentanyl. Benadryl PO ordered for patient to go home with.  Final Clinical Impressions(s) / ED Diagnoses   Final diagnoses:  Right upper quadrant pain  Nausea   MDM: Patient is a 45 yo AA female with extensive pmh that presents today with abd pain and nausea. Labs today show mild thrombocytopenia that needs to be followed by her PCP. CT scan without acute findings. Small amount of fluid noted in the peritoneum concerning for pancreatitis vs anasarca. Lipase normal. Low concern for acute pancreatitis. CT also showed stable liver cyst. Likely causing RUQ pain. Patient has appointment with GI in September. CT also showed cardiomegaly with dilated inferior vena cava. Patient is being followed by cardiology. Patient with known reduced ejection fraction heart failure and right heart failure.She has appointment with cardiologist in September. Pain was controlled in the ED. Benadryl given for pruritis which helped. D/C with pain medication and zofran. Patient encouraged to follow up with pcp. Patient verbalized understanding. Discussed plan of care with Dr. Lita Mains and he is agreeable.  New Prescriptions Discharge Medication List as of 01/19/2016 10:06 PM    START taking these medications   Details  ondansetron (ZOFRAN ODT) 4 MG disintegrating tablet Take 1 tablet (4 mg total) by mouth every 8 (eight) hours as needed for nausea or vomiting., Starting Mon 01/19/2016, Print    oxyCODONE (ROXICODONE) 5 MG immediate release tablet Take 1 tablet (5 mg total) by mouth every 4 (four) hours as needed for severe pain., Starting Mon 01/19/2016, Print  Doristine Devoid, PA-C 01/20/16 [id number]    Julianne Rice, MD 01/22/16 [phone]

## 2016-01-19 NOTE — ED Notes (Signed)
Pt gone to CT 

## 2016-01-19 NOTE — ED Notes (Signed)
EDPA  Made aware of pain and itching.

## 2016-01-19 NOTE — ED Notes (Signed)
Patient was alert, oriented and stable upon discharge. RN went over AVS and patient had no further questions.  

## 2016-01-22 DIAGNOSIS — H11153 Pinguecula, bilateral: Secondary | ICD-10-CM | POA: Diagnosis not present

## 2016-01-22 DIAGNOSIS — H40003 Preglaucoma, unspecified, bilateral: Secondary | ICD-10-CM | POA: Diagnosis not present

## 2016-01-22 DIAGNOSIS — H40023 Open angle with borderline findings, high risk, bilateral: Secondary | ICD-10-CM | POA: Diagnosis not present

## 2016-01-22 DIAGNOSIS — H18413 Arcus senilis, bilateral: Secondary | ICD-10-CM | POA: Diagnosis not present

## 2016-01-22 DIAGNOSIS — H04123 Dry eye syndrome of bilateral lacrimal glands: Secondary | ICD-10-CM | POA: Diagnosis not present

## 2016-01-22 DIAGNOSIS — H3589 Other specified retinal disorders: Secondary | ICD-10-CM | POA: Diagnosis not present

## 2016-01-22 DIAGNOSIS — H2513 Age-related nuclear cataract, bilateral: Secondary | ICD-10-CM | POA: Diagnosis not present

## 2016-01-22 DIAGNOSIS — H11423 Conjunctival edema, bilateral: Secondary | ICD-10-CM | POA: Diagnosis not present

## 2016-01-22 DIAGNOSIS — H40033 Anatomical narrow angle, bilateral: Secondary | ICD-10-CM | POA: Diagnosis not present

## 2016-01-28 ENCOUNTER — Ambulatory Visit (INDEPENDENT_AMBULATORY_CARE_PROVIDER_SITE_OTHER): Payer: Medicare Other | Admitting: *Deleted

## 2016-01-28 DIAGNOSIS — Z95810 Presence of automatic (implantable) cardiac defibrillator: Secondary | ICD-10-CM

## 2016-01-28 DIAGNOSIS — I429 Cardiomyopathy, unspecified: Secondary | ICD-10-CM

## 2016-01-28 DIAGNOSIS — I428 Other cardiomyopathies: Secondary | ICD-10-CM

## 2016-01-28 DIAGNOSIS — I5022 Chronic systolic (congestive) heart failure: Secondary | ICD-10-CM

## 2016-01-28 NOTE — Progress Notes (Signed)
Remote ICD transmission.   

## 2016-01-29 ENCOUNTER — Encounter: Payer: Self-pay | Admitting: Cardiology

## 2016-02-02 ENCOUNTER — Ambulatory Visit (HOSPITAL_COMMUNITY)
Admission: RE | Admit: 2016-02-02 | Discharge: 2016-02-02 | Disposition: A | Discharge status: Home / Self Care | Payer: Medicare Other | Source: Ambulatory Visit | Attending: Internal Medicine | Admitting: Internal Medicine

## 2016-02-02 VITALS — BP 92/66 | HR 61 | Wt 258.0 lb

## 2016-02-02 DIAGNOSIS — Z888 Allergy status to other drugs, medicaments and biological substances status: Secondary | ICD-10-CM | POA: Diagnosis not present

## 2016-02-02 DIAGNOSIS — Z9221 Personal history of antineoplastic chemotherapy: Secondary | ICD-10-CM | POA: Insufficient documentation

## 2016-02-02 DIAGNOSIS — Z8719 Personal history of other diseases of the digestive system: Secondary | ICD-10-CM | POA: Diagnosis not present

## 2016-02-02 DIAGNOSIS — Z853 Personal history of malignant neoplasm of breast: Secondary | ICD-10-CM | POA: Diagnosis not present

## 2016-02-02 DIAGNOSIS — I5023 Acute on chronic systolic (congestive) heart failure: Secondary | ICD-10-CM | POA: Diagnosis not present

## 2016-02-02 DIAGNOSIS — I5022 Chronic systolic (congestive) heart failure: Secondary | ICD-10-CM | POA: Insufficient documentation

## 2016-02-02 DIAGNOSIS — Z95810 Presence of automatic (implantable) cardiac defibrillator: Secondary | ICD-10-CM | POA: Insufficient documentation

## 2016-02-02 DIAGNOSIS — I428 Other cardiomyopathies: Secondary | ICD-10-CM | POA: Diagnosis not present

## 2016-02-02 DIAGNOSIS — I110 Hypertensive heart disease with heart failure: Secondary | ICD-10-CM | POA: Insufficient documentation

## 2016-02-02 DIAGNOSIS — J449 Chronic obstructive pulmonary disease, unspecified: Secondary | ICD-10-CM | POA: Insufficient documentation

## 2016-02-02 DIAGNOSIS — F449 Dissociative and conversion disorder, unspecified: Secondary | ICD-10-CM | POA: Diagnosis not present

## 2016-02-02 DIAGNOSIS — E119 Type 2 diabetes mellitus without complications: Secondary | ICD-10-CM | POA: Insufficient documentation

## 2016-02-02 DIAGNOSIS — Z7951 Long term (current) use of inhaled steroids: Secondary | ICD-10-CM | POA: Diagnosis not present

## 2016-02-02 DIAGNOSIS — Z79899 Other long term (current) drug therapy: Secondary | ICD-10-CM | POA: Insufficient documentation

## 2016-02-02 DIAGNOSIS — Z6841 Body Mass Index (BMI) 40.0 and over, adult: Secondary | ICD-10-CM | POA: Insufficient documentation

## 2016-02-02 DIAGNOSIS — F329 Major depressive disorder, single episode, unspecified: Secondary | ICD-10-CM | POA: Insufficient documentation

## 2016-02-02 DIAGNOSIS — K219 Gastro-esophageal reflux disease without esophagitis: Secondary | ICD-10-CM | POA: Diagnosis not present

## 2016-02-02 DIAGNOSIS — M797 Fibromyalgia: Secondary | ICD-10-CM | POA: Diagnosis not present

## 2016-02-02 MED ORDER — TORSEMIDE 20 MG PO TABS
40.0000 mg | ORAL_TABLET | Freq: Every day | ORAL | 6 refills | Status: DC
Start: 2016-02-02 — End: 2016-11-02

## 2016-02-02 NOTE — Progress Notes (Signed)
Patient ID: [name], female   DOB: Jan 18, 1971, 45 y.o.   MRN: [id number]  ADV  Patient ID: [name], female   DOB: April 20, 1971, 45 y.o.   MRN: [id number]  Primary Cardiologist:  Dr. Arvilla Meres Neurlogist: Dohmeir   PCP: Dr. Consuella Lose Griffin/Noel Redmon, PAC   History of Present Illness: [name] is a 45y.o. female with a history of morbid obesity, depression, fibromyalgia, congestive heart failure secondary to nonischemic cardiomyopathy (EF 20-25%) related to her chemotherapy for breast cancer. Last chemo 2008. Evaluated by neuro for stuttering.  Felt to have a conversion disorder. Confirmed by [facility].   She presented to [facility] on 11/26/14 with near syncope and dyspnea.With + orthostatics in ER (SBP 104 to 87 from lying to sitting) She also had volume overload with acute on chronic CHF. BNP 1093.0. Troponins normal. Had 7 lb  gain over 2 days with dietary indiscretion of sodium.Admitted for IV diuretic therapy and monitoring of BP. Coreg reduced 12.5 mg BID. Symptoms improved and good diuresis with IV lasix therapy. I/Os net negative 6 L total. Was transitioned back to PO Torsemide once euvolemic. Discharge weight was 249 lb.  Evaluated by pulmonary and Dr Vassie Loll told he saw no evidence of asthma.    She returns today for regular follow up. Ivabradine added at last visit. Says she is about the same. Continues to have n/v and ab pain around to her back. + LE edema. Recent CT ab/pelvis for n/v. Showed fatty liver with ? cirrhosis. Dilated IVC Weight stable. Weight up at least 6 pounds Takes torsemide 20 daily and takes extra 1-2x/week. 1-2x/month will take metolazone. No orthopnea or PND.  SBP typically in 90s. Occasional dizziness. Very fatigued.    Studies:  CPX Feb 2010. Peak VO2 was 14.5 which was 71% of predicted. When corrected for body weight the VO2 was 20.7. The slope was 27. RER 1.20. O2 pulse 73%. Overall this was felt to be only a very mild functional limitation due to  her obesity and mild circulatory limitation.  CPX 2/11: pVO2 13.0 (72%) correct for ideal wt 52ml/kg/min RER 1.06 (submax) slope 28.2 O2 pulse 91% - felt no signifcant cardiac limitation. + deconditioning.   Echo 04/2009  EF back down to 25%.  RHC normal. Echo 07/2009 showed EF 50%  Echo 06/2010 EF 40%. So we did MUGA EF 58% Echo 12/2010 40-45% Echo 05/2011 EF 30%. Grade 2 diastolic dysfunction Cath (12/28/10) was set up and demonstrated EF 20-25% and normal coronary arteries.  RA  4, RV 35/11, PA  28/8 (17), PCWP 8. Fick 5.2/2.4 PVR 1.7 Woods. Fick 5.2/2.4 Aortic saturation was 97%.  PA saturation was 67% and 68%. ECHO 09/03/13 EF 20-25%  ECHO 06/24/14 EF 20-25% Cardiac MRI on 07/19/11: Moderate to severe LVE, Diffuse hypokinesis. EF 32%, Mild LAE ECHO 10/13/15 EF 20-25% Mild to moderate RV dysfunction   Labs  09/03/13 k 3.3 Creatinine 0.86 Pro BNP 998 Dig level 0.7 05/17/14 K 4.7 Creatinine 0.9 pBNP 1007 digoxin 1.2 11/29/14 K 4.2 Creatinine 1.08 Dig 0.3 01/19/16  K 3.7 creatinine 0.90  SH: lived with her husband. Does not drive. Does not drink alcohol or smoke   Past Medical History:  Diagnosis Date  . Anemia 1980's   [id number]  . Anxiety   . 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 Blood transfusion 1980's   1987 or 1988  . Breast calcifications    right breast  . Breast cancer (HCC)    right;  . CHF (congestive heart failure) (HCC)  due to non-ischemic cardiomyopathy, thought to be chemotherapy induced;  cath 7/12: normal cors, EF 20-25%. Cardiac MRI 05/2011 EF 32%. ICD implantation 10/2011 (Medtronic)  . 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 Conversion disorder   . 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 Depression   . Fibromyalgia   . GERD (gastroesophageal reflux disease)   . History of stomach ulcers   . Hypertension    c/b orthostatic hypotention  . ICD (implantable cardiac defibrillator) in place   . Interstitial cystitis   . 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   . Nonischemic cardiomyopathy (HCC)    related to chemo; EF 30% 05/2011  . Obesity   . Palpitation    normal sinus rhythm only on 21 day heart monitor  . Peripheral neuropathy (HCC)    chemo- induced  . Pneumonia 2000's   "once"  . Type II diabetes mellitus (HCC)    Current Outpatient Prescriptions on File Prior to Encounter  Medication Sig Dispense Refill  . acetaminophen (TYLENOL) 325 MG tablet Take 2 tablets (650 mg total) by mouth every 4 (four) hours as needed for headache or mild pain.    Marland Kitchen albuterol (PROVENTIL HFA;VENTOLIN HFA) 108 (90 Base) MCG/ACT inhaler Inhale 1-2 puffs into the lungs daily.    Marland Kitchen ALPRAZolam (XANAX) 1 MG tablet Take 1 mg by mouth at bedtime as needed for sleep.    . baclofen (LIORESAL) 10 MG tablet TAKE 1 TABLET BY MOUTH 2 (TWO) TIMES DAILY AS NEEDED FOR MUSCLE SPASMS. 60 tablet 0  . beclomethasone (QVAR) 80 MCG/ACT inhaler Inhale 2 puffs into the lungs 2 (two) times daily.     . carvedilol (COREG) 25 MG tablet Take 0.5 tablets (12.5 mg total) by mouth 2 (two) times daily with a meal. 60 tablet 6  . Cholecalciferol (VITAMIN D3) 5000 UNITS CAPS Take 1 capsule by mouth daily.     . Coenzyme Q10 (CO Q-10) 100 MG CAPS Take 100 mg by mouth daily.    . Cyanocobalamin 2500 MCG SUBL Place 2,500 mcg under the tongue daily.    . digoxin (LANOXIN) 0.125 MG tablet Take 0.125 mg by mouth daily.    . divalproex (DEPAKOTE ER) 250 MG 24 hr tablet Take 250 mg by mouth every evening.     . ferrous sulfate 325 (65 FE) MG tablet Take 325 mg by mouth daily with breakfast.    . GLUCOSAMINE-CHONDROITIN PO Take 1,200 mg by mouth daily.    . hydrOXYzine (ATARAX/VISTARIL) 25 MG tablet Take 25 mg by mouth at bedtime as needed for itching (DOESN'T TAKE WITH TRAZODONE).    Marland Kitchen ibuprofen (ADVIL,MOTRIN) 200 MG tablet Take 800 mg by mouth 2 (two) times daily as needed for moderate pain.     . ivabradine (CORLANOR) 5 MG TABS  tablet Take 1 tablet (5 mg total) by mouth 2 (two) times daily with a meal. 60 tablet 6  . loratadine (CLARITIN) 10 MG tablet Take 10 mg by mouth daily.    Marland Kitchen losartan (COZAAR) 25 MG tablet TAKE 0.5 TABLETS (12.5 MG TOTAL) BY MOUTH DAILY. 45 tablet 3  . MAG ASPART-POTASSIUM ASPART PO Take 3 tablets by mouth daily.    . metolazone (ZAROXOLYN) 2.5 MG tablet Take 2.5 mg by mouth daily as needed (edema).    . Multiple Vitamin (MULTIVITAMIN WITH MINERALS) TABS Take 1 tablet by mouth daily. Reported on 10/13/2015    . omeprazole (PRILOSEC) 20 MG capsule TAKE  1 CAPSULE BY MOUTH DAILY. 30 capsule 5  . ondansetron (ZOFRAN ODT) 4 MG disintegrating tablet Take 1 tablet (4 mg total) by mouth every 8 (eight) hours as needed for nausea or vomiting. 20 tablet 0  . oxyCODONE (ROXICODONE) 5 MG immediate release tablet Take 1 tablet (5 mg total) by mouth every 4 (four) hours as needed for severe pain. 24 tablet 0  . PATADAY 0.2 % SOLN Place 1 drop into both eyes daily.     . pentosan polysulfate (ELMIRON) 100 MG capsule Take 200 mg by mouth daily.    . potassium chloride SA (K-DUR,KLOR-CON) 20 MEQ tablet Take 20 mEq by mouth daily.    . pregabalin (LYRICA) 200 MG capsule Take 1 capsule (200 mg total) by mouth 2 (two) times daily. (Patient taking differently: Take 200 mg by mouth every evening. ) 60 capsule 5  . spironolactone (ALDACTONE) 25 MG tablet Take 0.5 tablets (12.5 mg total) by mouth daily. 45 tablet 6  . torsemide (DEMADEX) 20 MG tablet Take 1 tablet (20 mg total) by mouth daily. 30 tablet 1  . traMADol (ULTRAM) 50 MG tablet Take 50 mg by mouth daily as needed for moderate pain.     . traZODone (DESYREL) 50 MG tablet Take 50 mg by mouth as needed. FOR SLEEP     No current facility-administered medications on file prior to encounter.      Allergies  Allergen Reactions  . Reglan [Metoclopramide Hcl] Anxiety  . Adhesive [Tape] Itching and Rash  . Other Itching and Rash    Chloraprep  . Ace Inhibitors      hyperkalemia  . Losartan     hyperkalemia  . Chlorhexidine Itching and Rash   ROS: All pertinent positives or negatives as in HPI otherwise negative   Vital Signs: Vitals:   02/02/16 1151  BP: 92/66  BP Location: Left Arm  Patient Position: Sitting  Cuff Size: Normal  Pulse: 61  SpO2: 100%  Weight: 258 lb (117 kg)    PHYSICAL EXAM: Well nourished, well developed, in no acute distress.   HEENT: normal  Neck: JVP to ear Cardiac:  Lateral PMI, normal S1, S2; RRR; no murmur, No S3 L upper chest scar ICD Lungs:  clear to auscultation bilaterally, no wheezing, rhonchi or rales  Abd: obese, soft, nontender, + distended  Ext: warm 1-2+ lower extremity edema.     Skin: warm and dry  Neuro:  CNs 2-12 intact, no focal abnormalities noted Psych: Normal affect  ASSESSMENT AND PLAN: 1. Chronic systolic CHF: Nonischemic cardiomyopathy.  Medtronic ICD. Echo 5/17 EF 20-25% with moderately dilated LV.   --Chronic NYHA III.  - Volume status markedly elevated on exam. Increase torsemide to 40 daily. GIve metolazone x 1 day. - See back in 2 weeks. If no improvement will need RHC. - On carvedilol  12.5 mg twice a day. Dose recently reduced due to orthostasis.  - On dig 0.0625 mg daily. (level 0.7 in 5/17) - Continue spiro and losartan 12.5 daily and ivabradine 5 bid - Check labs today and in 1 week.  - Given weight and comorbidities has not been felt to be candidate for Tx/VAD 2. Conversion disorder: Followed by neurology  3. Morbid obesity: Needs to work at diet/exercise for weight loss.   Destane Speas,MD 11:49 AM

## 2016-02-02 NOTE — Patient Instructions (Signed)
INCREASE Torsemide to 40 mg (2 tabs) once daily.  Take 1 metolazone tablet today.  Follow up next week.  Do the following things EVERYDAY: 1) Weigh yourself in the morning before breakfast. Write it down and keep it in a log. 2) Take your medicines as prescribed 3) Eat low salt foods-Limit salt (sodium) to 2000 mg per day.  4) Stay as active as you can everyday 5) Limit all fluids for the day to less than 2 liters

## 2016-02-10 ENCOUNTER — Encounter: Payer: Self-pay | Admitting: Nurse Practitioner

## 2016-02-10 ENCOUNTER — Encounter (HOSPITAL_COMMUNITY): Payer: Self-pay

## 2016-02-10 ENCOUNTER — Other Ambulatory Visit: Payer: Self-pay | Admitting: Internal Medicine

## 2016-02-11 ENCOUNTER — Telehealth (HOSPITAL_COMMUNITY): Payer: Self-pay | Admitting: Vascular Surgery

## 2016-02-11 NOTE — Telephone Encounter (Signed)
Pt called she would like to speak to DB/ or Nurse about her fluid/ 21.3 bls came off, she started doubling up on fluid pills on 02/02/16. She wants to know if she needs to continue to take double meds.. Please advise

## 2016-02-12 NOTE — Telephone Encounter (Signed)
Patient called CHF clinic triage line to report reason for missing this week's apt is that she has a respiratory illness. Patient endorses losing 20 lbs since she was seen bvy Dr. Haroldine Laws recently and doubling her diuretic dose. Patient states other than her cold, she is feeling well.  Denies dizziness, weakness, fatigue. Advised to come in beginning of next week to be evaluated following her increase in diuretics.  Also advised per Dr. Daphene Calamity if she becomes dizzy over the weekend to decrease torsemide to 1 tab daily and drink extra glass of water until she can call us on Monday to follow up. Patient also c/o difficulty getting her digoxin refilled.  States she has called and had [facility] try to contact us for a week with no results.  She has not had digoxin during this past week to take.  Per our records, digoxin was sent electronically from our office yesterday afternoon to preferred pharmacy.  Made patient aware and apologized for this inconvenience. Patient will call us again if she has worsening s/s, otherwise will be seen in clinic next week.  Renee Pain, RN

## 2016-02-13 ENCOUNTER — Encounter: Payer: Self-pay | Admitting: Adult Health

## 2016-02-17 LAB — CUP PACEART REMOTE DEVICE CHECK
Battery Voltage: 3.11 V
Date Time Interrogation Session: 20170816073432
HIGH POWER IMPEDANCE MEASURED VALUE: 58 Ohm
HighPow Impedance: 361 Ohm
Implantable Lead Implant Date: 20130522
Implantable Lead Model: 181
Lead Channel Sensing Intrinsic Amplitude: 14 mV
Lead Channel Setting Pacing Pulse Width: 0.4 ms
Lead Channel Setting Sensing Sensitivity: 0.3 mV
MDC IDC LEAD LOCATION: 753860
MDC IDC LEAD SERIAL: 318396
MDC IDC MSMT LEADCHNL RV IMPEDANCE VALUE: 361 Ohm
MDC IDC MSMT LEADCHNL RV PACING THRESHOLD AMPLITUDE: 1 V
MDC IDC MSMT LEADCHNL RV PACING THRESHOLD PULSEWIDTH: 0.4 ms
MDC IDC MSMT LEADCHNL RV SENSING INTR AMPL: 14 mV
MDC IDC SET LEADCHNL RV PACING AMPLITUDE: 2.25 V
MDC IDC STAT BRADY RV PERCENT PACED: 0.01 %

## 2016-02-18 ENCOUNTER — Emergency Department (HOSPITAL_COMMUNITY)
Admission: EM | Admit: 2016-02-18 | Discharge: 2016-02-18 | Disposition: A | Discharge status: Home / Self Care | Payer: Medicare Other | Attending: Emergency Medicine | Admitting: Emergency Medicine

## 2016-02-18 ENCOUNTER — Emergency Department (HOSPITAL_COMMUNITY): Payer: Medicare Other

## 2016-02-18 ENCOUNTER — Encounter (HOSPITAL_COMMUNITY): Payer: Self-pay | Admitting: Emergency Medicine

## 2016-02-18 ENCOUNTER — Encounter (HOSPITAL_COMMUNITY): Payer: Self-pay

## 2016-02-18 ENCOUNTER — Telehealth (HOSPITAL_COMMUNITY): Payer: Self-pay | Admitting: Vascular Surgery

## 2016-02-18 DIAGNOSIS — I5022 Chronic systolic (congestive) heart failure: Secondary | ICD-10-CM | POA: Diagnosis not present

## 2016-02-18 DIAGNOSIS — E119 Type 2 diabetes mellitus without complications: Secondary | ICD-10-CM | POA: Diagnosis not present

## 2016-02-18 DIAGNOSIS — I110 Hypertensive heart disease with heart failure: Secondary | ICD-10-CM | POA: Diagnosis not present

## 2016-02-18 DIAGNOSIS — IMO0001 Reserved for inherently not codable concepts without codable children: Secondary | ICD-10-CM

## 2016-02-18 DIAGNOSIS — R6889 Other general symptoms and signs: Secondary | ICD-10-CM

## 2016-02-18 DIAGNOSIS — Z79899 Other long term (current) drug therapy: Secondary | ICD-10-CM | POA: Insufficient documentation

## 2016-02-18 DIAGNOSIS — J449 Chronic obstructive pulmonary disease, unspecified: Secondary | ICD-10-CM | POA: Insufficient documentation

## 2016-02-18 DIAGNOSIS — R55 Syncope and collapse: Secondary | ICD-10-CM | POA: Diagnosis present

## 2016-02-18 DIAGNOSIS — Z853 Personal history of malignant neoplasm of breast: Secondary | ICD-10-CM | POA: Diagnosis not present

## 2016-02-18 DIAGNOSIS — G40909 Epilepsy, unspecified, not intractable, without status epilepticus: Secondary | ICD-10-CM | POA: Insufficient documentation

## 2016-02-18 DIAGNOSIS — R0681 Apnea, not elsewhere classified: Secondary | ICD-10-CM | POA: Diagnosis not present

## 2016-02-18 LAB — CBC
HEMATOCRIT: 44 % (ref 36.0–46.0)
HEMOGLOBIN: 15 g/dL (ref 12.0–15.0)
MCH: 32.1 pg (ref 26.0–34.0)
MCHC: 34.1 g/dL (ref 30.0–36.0)
MCV: 94 fL (ref 78.0–100.0)
Platelets: 154 10*3/uL (ref 150–400)
RBC: 4.68 MIL/uL (ref 3.87–5.11)
RDW: 13.4 % (ref 11.5–15.5)
WBC: 5 10*3/uL (ref 4.0–10.5)

## 2016-02-18 LAB — I-STAT TROPONIN, ED: TROPONIN I, POC: 0 ng/mL (ref 0.00–0.08)

## 2016-02-18 LAB — BASIC METABOLIC PANEL
ANION GAP: 13 (ref 5–15)
BUN: 19 mg/dL (ref 6–20)
CO2: 23 mmol/L (ref 22–32)
Calcium: 9.9 mg/dL (ref 8.9–10.3)
Chloride: 97 mmol/L — ABNORMAL LOW (ref 101–111)
Creatinine, Ser: 0.94 mg/dL (ref 0.44–1.00)
GLUCOSE: 89 mg/dL (ref 65–99)
POTASSIUM: 4 mmol/L (ref 3.5–5.1)
Sodium: 133 mmol/L — ABNORMAL LOW (ref 135–145)

## 2016-02-18 LAB — URINALYSIS, ROUTINE W REFLEX MICROSCOPIC
Glucose, UA: NEGATIVE mg/dL
Hgb urine dipstick: NEGATIVE
Ketones, ur: 15 mg/dL — AB
LEUKOCYTES UA: NEGATIVE
NITRITE: NEGATIVE
PH: 5.5 (ref 5.0–8.0)
Protein, ur: NEGATIVE mg/dL
SPECIFIC GRAVITY, URINE: 1.018 (ref 1.005–1.030)

## 2016-02-18 LAB — BRAIN NATRIURETIC PEPTIDE: B NATRIURETIC PEPTIDE 5: 274.5 pg/mL — AB (ref 0.0–100.0)

## 2016-02-18 LAB — MAGNESIUM: MAGNESIUM: 1.8 mg/dL (ref 1.7–2.4)

## 2016-02-18 LAB — DIGOXIN LEVEL

## 2016-02-18 LAB — CBG MONITORING, ED: Glucose-Capillary: 88 mg/dL (ref 65–99)

## 2016-02-18 LAB — VALPROIC ACID LEVEL: VALPROIC ACID LVL: 56 ug/mL (ref 50.0–100.0)

## 2016-02-18 MED ORDER — IOPAMIDOL (ISOVUE-300) INJECTION 61%
INTRAVENOUS | Status: AC
  Filled 2016-02-18: qty 100

## 2016-02-18 MED ORDER — IOPAMIDOL (ISOVUE-300) INJECTION 61%
INTRAVENOUS | Status: AC
  Administered 2016-02-18: 75 mL
  Filled 2016-02-18: qty 100

## 2016-02-18 MED ORDER — VALPROATE SODIUM 500 MG/5ML IV SOLN
500.0000 mg | Freq: Once | INTRAVENOUS | Status: AC
  Administered 2016-02-18: 500 mg via INTRAVENOUS
  Filled 2016-02-18: qty 5

## 2016-02-18 NOTE — ED Triage Notes (Signed)
Pt arrives via stretcher from heart failure clinic on campus where patient was near syncopal in their waiting room. Pt reports increase of seizures over the last day. Pt also states diabetic. Pt awake, alert, drowsy appearance. VSS.

## 2016-02-18 NOTE — ED Provider Notes (Addendum)
[facility] DEPT Provider Note   CSN: [id number] Arrival date & time: 02/18/16  1243     History   Chief Complaint Chief Complaint  Patient presents with  . Near Syncope  . Seizures    HPI [name] is a 45 y.o. female.  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 Per report, she has had multiple episodes over the last several days where she feels "a seizure coming on." She feels mildly lightheaded and stressed followed by reported loss of her bilateral vision. She states she feels associated shaking all over, but does not lose consciousness and can't hear voices as well as respond intermittently during these episodes. She has a history of conversion disorder with similar complaints, although she also has a history of seizures. Denies any loss of bowel or bladder function. She states she may have bit her tongue yesterday. Denies any associated chest pain or palpitations. She has a defibrillator, which has not fired to her knowledge. She has otherwise been well. Denies any recent medication changes. She does endorse increased stress at home.  Past Medical History:  Diagnosis Date  . Anemia 1980's   [id number]  . Anxiety   . 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 Blood transfusion 1980's   1987 or 1988  . Breast calcifications    right breast  . Breast cancer (HCC)    right;  . CHF (congestive heart failure) ([place])    due to non-ischemic cardiomyopathy, thought to be chemotherapy induced;  cath 7/12: normal cors, EF 20-25%. Cardiac MRI 05/2011 EF 32%. ICD implantation 10/2011 ([employer])  . 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 Conversion disorder   . 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 Depression   . Fibromyalgia    . GERD (gastroesophageal reflux disease)   . History of stomach ulcers   . Hypertension    c/b orthostatic hypotention  . ICD (implantable cardiac defibrillator) in place   . Interstitial cystitis   . 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   . Nonischemic cardiomyopathy ([place])    related to chemo; EF 30% 05/2011  . Obesity   . Palpitation    normal sinus rhythm only on 21 day heart monitor  . Peripheral neuropathy (HCC)    chemo- induced  . Pneumonia 2000's   "once"  . Type II diabetes mellitus [facility])     Patient Active Problem List   Diagnosis Date Noted  . Conversion disorder with abnormal movement 12/25/2014  . Convulsions/seizures ([facility]) 12/25/2014  . Myoclonus 12/20/2014  . Acute renal insufficiency 11/28/2014  . Acute on chronic systolic CHF (congestive heart failure) ([place]) 11/27/2014  . ICD in place (MDT 2013) 11/27/2014  . Near syncope 11/26/2014  . Stomach discomfort 10/14/2014  . Generalized anxiety disorder 07/08/2014  . Physical deconditioning 06/24/2014  . Breast cancer, right breast ([place]) 05/29/2014  . Fibromyalgia affecting multiple sites 05/29/2014  . Chronic pain not due to malignancy 05/29/2014  . Dyspnea 01/07/2014  . Chronic sinusitis 01/07/2014  . Conversion disorder 09/03/2013  . 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 Uncontrolled persistent asthma 11/15/2012  . Orthostatic hypotension 05/18/2012  . Keloid 03/14/2012  . Single implantable cardioverter-defibrillator in situ 11/03/2011  . Unspecified vitamin  D deficiency 08/19/2011  . Weakness 07/15/2011  . Chest pain 12/09/2010  . GERD (gastroesophageal reflux disease) 12/09/2010  . Secondary cardiomyopathy s/p chemo therapy 04/01/2009  . FATIGUE / MALAISE 10/02/2008  . SYSTOLIC HEART FAILURE, CHRONIC 08/25/2008  . Obesity-BMI 38 04/29/2008  .  Essential hypertension 04/29/2008  . DIFFICULTY IN [facility] 04/29/2008  . DIZZINESS 04/29/2008  . NUMBNESS 04/29/2008  . EDEMA 04/29/2008  . ABNORMAL WEIGHT GAIN 04/29/2008  . PALPITATIONS 04/29/2008  . ORTHOPNEA 04/29/2008  . DYSPNEA 04/29/2008  . COUGH 04/29/2008    Past Surgical History:  Procedure Laterality Date  . BREAST LUMPECTOMY  2008; 2013   right  . CARDIAC DEFIBRILLATOR PLACEMENT  11/03/11  . CHOLECYSTECTOMY  ~ 2009  . IMPLANTABLE CARDIOVERTER DEFIBRILLATOR IMPLANT N/A 11/03/2011   Procedure: IMPLANTABLE CARDIOVERTER DEFIBRILLATOR IMPLANT;  Surgeon: Deboraha Sprang, MD; MDT   . LAPAROSCOPIC ENDOMETRIOSIS FULGURATION  1990's  . NASAL SINUS SURGERY    . [place] REMOVAL  2010?   left chest; placed in 2008  . TONSILLECTOMY  1980's  . TUBAL LIGATION  1990's  . VAGINAL HYSTERECTOMY  2000's    OB History    No data available       Home Medications    Prior to Admission medications   Medication Sig Start Date End Date Taking? Authorizing Provider  albuterol (PROVENTIL HFA;VENTOLIN HFA) 108 (90 Base) MCG/ACT inhaler Inhale 1-2 puffs into the lungs daily.   Yes Historical Provider, MD  baclofen (LIORESAL) 10 MG tablet TAKE 1 TABLET BY MOUTH 2 (TWO) TIMES DAILY AS NEEDED FOR MUSCLE SPASMS. 08/14/15  Yes Golden Circle, FNP  beclomethasone (QVAR) 80 MCG/ACT inhaler Inhale 2 puffs into the lungs 2 (two) times daily.    Yes Historical Provider, MD  carvedilol (COREG) 25 MG tablet Take 0.5 tablets (12.5 mg total) by mouth 2 (two) times daily with a meal. 11/27/14  Yes Erlene Quan, PA-C  Cholecalciferol (VITAMIN D3) 5000 UNITS CAPS Take 1 capsule by mouth daily.    Yes Historical Provider, MD  Coenzyme Q10 (CO Q-10) 100 MG CAPS Take 100 mg by mouth daily.   Yes Historical Provider, MD  Cyanocobalamin 2500 MCG SUBL Place 2,500 mcg under the tongue daily.   Yes Historical Provider, MD  digoxin (LANOXIN) 0.125 MG tablet TAKE 1 TABLET BY MOUTH EVERY DAY 02/11/16  Yes Jolaine Artist, MD  divalproex (DEPAKOTE ER) 250 MG 24 hr tablet Take 250 mg by mouth every evening.    Yes Historical Provider, MD  ferrous sulfate 325 (65 FE) MG tablet Take 325 mg by mouth daily with breakfast.   Yes Historical Provider, MD  GLUCOSAMINE-CHONDROITIN PO Take 1,200 mg by mouth daily.   Yes Historical Provider, MD  hydrOXYzine (ATARAX/VISTARIL) 25 MG tablet Take 25 mg by mouth at bedtime as needed for itching (DOESN'T TAKE WITH TRAZODONE).   Yes Historical Provider, MD  ivabradine (CORLANOR) 5 MG TABS tablet Take 1 tablet (5 mg total) by mouth 2 (two) times daily with a meal. 10/13/15  Yes Jolaine Artist, MD  loratadine (CLARITIN) 10 MG tablet Take 10 mg by mouth daily.   Yes Historical Provider, MD  losartan (COZAAR) 25 MG tablet TAKE 0.5 TABLETS (12.5 MG TOTAL) BY MOUTH DAILY. Patient taking differently: Take 12.5 mg by mouth every evening. TAKE 0.5 TABLETS (12.5 MG TOTAL) BY MOUTH DAILY. 12/01/15  Yes Jolaine Artist, MD  metolazone (ZAROXOLYN) 2.5 MG tablet Take 2.5 mg by mouth daily as needed (edema).  Yes Historical Provider, MD  Multiple Vitamin (MULTIVITAMIN WITH MINERALS) TABS Take 1 tablet by mouth daily. Reported on 10/13/2015   Yes Historical Provider, MD  omeprazole (PRILOSEC) 20 MG capsule TAKE 1 CAPSULE BY MOUTH DAILY. 06/23/15  Yes Golden Circle, FNP  ondansetron (ZOFRAN ODT) 4 MG disintegrating tablet Take 1 tablet (4 mg total) by mouth every 8 (eight) hours as needed for nausea or vomiting. 01/19/16  Yes Doristine Devoid, PA-C  oxyCODONE (ROXICODONE) 5 MG immediate release tablet Take 1 tablet (5 mg total) by mouth every 4 (four) hours as needed for severe pain. 01/19/16  Yes Kenneth T Leaphart, PA-C  PATADAY 0.2 % SOLN Place 1 drop into both eyes daily.  01/22/13  Yes Historical Provider, MD  pentosan polysulfate (ELMIRON) 100 MG capsule Take 200 mg by mouth daily.   Yes Historical Provider, MD  potassium chloride SA (K-DUR,KLOR-CON) 20 MEQ tablet Take 40 mEq by mouth  daily.    Yes Historical Provider, MD  pregabalin (LYRICA) 200 MG capsule Take 1 capsule (200 mg total) by mouth 2 (two) times daily. Patient taking differently: Take 200 mg by mouth every evening.  12/25/14  Yes Carmen Dohmeier, MD  spironolactone (ALDACTONE) 25 MG tablet Take 0.5 tablets (12.5 mg total) by mouth daily. 12/01/15  Yes Amy D Clegg, NP  torsemide (DEMADEX) 20 MG tablet Take 2 tablets (40 mg total) by mouth daily. 02/02/16  Yes Jolaine Artist, MD  traMADol (ULTRAM) 50 MG tablet Take 50 mg by mouth daily as needed for moderate pain.  10/31/14  Yes Historical Provider, MD  traZODone (DESYREL) 50 MG tablet Take 50 mg by mouth as needed. FOR SLEEP 10/29/14  Yes Historical Provider, MD  acetaminophen (TYLENOL) 325 MG tablet Take 2 tablets (650 mg total) by mouth every 4 (four) hours as needed for headache or mild pain. Patient not taking: Reported on 02/18/2016 11/27/14   Erlene Quan, PA-C    Family History Family History  Problem Relation Age of Onset  . Heart disease Mother   . Coronary artery disease    . Heart attack    . Heart disease Maternal Uncle   . Cancer Paternal Uncle     colon  . Heart disease Maternal Grandmother   . Cancer Paternal Grandmother     ovarian    Social History Social History  Substance Use Topics  . Smoking status: Never Smoker  . Smokeless tobacco: Never Used  . Alcohol use Yes     Comment: 11/03/11 "maybe once a month"     Allergies   Ace inhibitors; Losartan; Reglan [metoclopramide hcl]; Adhesive [tape]; Other; and Chlorhexidine   Review of Systems Review of Systems  Constitutional: Positive for fatigue. Negative for chills and fever.  HENT: Negative for congestion, rhinorrhea and sore throat.   Eyes: Negative for visual disturbance.  Respiratory: Negative for cough, shortness of breath and wheezing.   Cardiovascular: Negative for chest pain and leg swelling.  Gastrointestinal: Negative for abdominal pain, diarrhea, nausea and  vomiting.  Genitourinary: Negative for dysuria, flank pain, vaginal bleeding and vaginal discharge.  Musculoskeletal: Negative for neck pain.  Skin: Negative for rash.  Allergic/Immunologic: Negative for immunocompromised state.  Neurological: Positive for seizures, weakness and light-headedness. Negative for syncope and headaches.  Hematological: Does not bruise/bleed easily.  All other systems reviewed and are negative.    Physical Exam Updated Vital Signs BP 98/59   Pulse 70   Temp 98.1 F (36.7 C) (Oral)   Resp  22   Ht 5\' 7"  (1.702 m)   Wt 231 lb 9.6 oz (105.1 kg)   SpO2 97%   BMI 36.27 kg/m   Physical Exam  Constitutional: She is oriented to person, place, and time. She appears well-developed and well-nourished. No distress.  HENT:  Head: Normocephalic and atraumatic.  Eyes: Conjunctivae are normal.  Neck: Neck supple.  Cardiovascular: Normal rate, regular rhythm and normal heart sounds.  Exam reveals no friction rub.   No murmur heard. Pulmonary/Chest: Effort normal and breath sounds normal. No respiratory distress. She has no wheezes. She has no rales.  Abdominal: She exhibits no distension.  Musculoskeletal: She exhibits no edema.  Neurological: She is alert and oriented to person, place, and time. She has normal strength. She is not disoriented. She displays normal reflexes. No cranial nerve deficit or sensory deficit. She exhibits normal muscle tone. Coordination and gait normal. GCS eye subscore is 4. GCS verbal subscore is 5. GCS motor subscore is 6.  Skin: Skin is warm. Capillary refill takes less than 2 seconds.  Psychiatric: She has a normal mood and affect.  Nursing note and vitals reviewed.   Neurological Exam:  Mental Status: Alert and oriented to person, place, and time. Attention and concentration normal. Speech clear. Recent memory is intact. Cranial Nerves: Visual fields intact to confrontation in all quadrants bilaterally. EOMI and PERRLA. No  nystagmus noted. Facial sensation intact at forehead, maxillary cheek, and chin/mandible bilaterally. No weakness of masticatory muscles. No facial asymmetry or weakness. Hearing grossly normal to finer rub. Uvula is midline, and palate elevates symmetrically. Normal SCM and trapezius strength. Tongue midline without fasciculations Motor: Muscle strength 5/5 in proximal and distal UE and LE bilaterally. No pronator drift. Muscle tone normal. Reflexes: 2+ and symmetrical in all four extremities.  Sensation: Intact to light touch in upper and lower extremities distally bilaterally.  Gait: Normal without ataxia. Coordination: Normal FTN bilaterally.   ED Treatments / Results  Labs (all labs ordered are listed, but only abnormal results are displayed) Labs Reviewed  BASIC METABOLIC PANEL - Abnormal; Notable for the following:       Result Value   Sodium 133 (*)    Chloride 97 (*)    All other components within normal limits  URINALYSIS, ROUTINE W REFLEX MICROSCOPIC (NOT AT [facility]) - Abnormal; Notable for the following:    Color, Urine AMBER (*)    Bilirubin Urine SMALL (*)    Ketones, ur 15 (*)    All other components within normal limits  DIGOXIN LEVEL - Abnormal; Notable for the following:    Digoxin Level <0.2 (*)    All other components within normal limits  BRAIN NATRIURETIC PEPTIDE - Abnormal; Notable for the following:    B Natriuretic Peptide 274.5 (*)    All other components within normal limits  CBC  VALPROIC ACID LEVEL  MAGNESIUM  CBG MONITORING, ED  CBG MONITORING, ED  I-STAT TROPOININ, ED    EKG  EKG Interpretation  Date/Time:  Wednesday February 18 2016 17:14:55 EDT Ventricular Rate:  67 PR Interval:  202 QRS Duration: 120 QT Interval:  467 QTC Calculation: 493 R Axis:   -40 Text Interpretation:  Sinus rhythm Prolonged PR interval Biatrial enlargement Left anterior fascicular block Since last tracing, baseline wander has improved Non-specific ST changes in  lateral leads that have been observed previously Confirmed by Ellender Hose MD, Lysbeth Galas [id number]) on 02/19/2016 3:12:37 AM       Radiology Ct Head W Or Wo Contrast  Result Date: 02/18/2016 CLINICAL DATA:  Near syncopal episode. Seizure-like activity. Personal history of breast cancer. EXAM: CT HEAD WITHOUT AND WITH CONTRAST TECHNIQUE: Contiguous axial images were obtained from the base of the skull through the vertex without and with intravenous contrast CONTRAST:  1 ISOVUE-300 IOPAMIDOL (ISOVUE-300) INJECTION 61%<Contrast>1 ISOVUE-300 IOPAMIDOL (ISOVUE-300) INJECTION 61% COMPARISON:  CT head without contrast 05/03/2013 FINDINGS: Brain: No acute infarct, hemorrhage, or mass lesion is present. The ventricles are of normal size. No significant extraaxial fluid collection is present. The postcontrast images demonstrate no pathologic enhancement. Vascular: No significant vascular calcifications are present. There is no hyperdense vessel. Skull: The calvarium is intact. No focal lytic or blastic lesions are present. Sinuses/Orbits: Despite previous maxillary antrostomies, the maxillary sinuses are near completely opacified. There is diffuse opacification of ethmoid air cells incomplete opacification of the frontal sinuses bilaterally. The right sphenoid sinus is opacified. The left sphenoid sinus and bilateral posterior ethmoid air cells are the only patent sinuses. The mastoid air cells are patent. Other: IMPRESSION: 1. Normal CT appearance the brain without and with contrast. 2. Extensive anterior chronic maxillary, ethmoid, and frontal sinus disease. 3. Opacification of the right sphenoid sinus. The left sphenoid sinus is patent. Electronically Signed   By: San Morelle M.D.   On: 02/18/2016 21:53    Procedures Procedures (including critical care time)  Medications Ordered in ED Medications  valproate (DEPACON) 500 mg in dextrose 5 % 50 mL IVPB (0 mg Intravenous Stopped 02/18/16 2218)  iopamidol  (ISOVUE-300) 61 % injection (75 mLs  Contrast Given 02/18/16 2055)     Initial Impression / Assessment and Plan / ED Course  I have reviewed the triage vital signs and the nursing notes.  Pertinent labs & imaging results that were available during my care of the patient were reviewed by me and considered in my medical decision making (see chart for details).  Clinical Course    44 yo F with complex PMHx including CHF, breast CA, seizure disorder, as well as conversion disorder with PNES who p/w increasing episodes of seizure-like activity. See HPI above. On arrival, VSS and WNL for pt. She has no focal neurological exams and is overall very well appearing. Regarding her episodes, my primary suspicion is increasing frequency of PNES. Her episodes are highly atypical in that she feels an "aura" coming on, followed by shaking but no LOC and she is responsive during the episodes, which does not fit with GTC seizure activity. With her cardiac history, arrhythmia/near syncope is also on DDx though this would be atypical as well given shaking without LOC. Will interrogate PM, discuss with both Cardiology and Neurology, and send screening labs. Pt does endorse increased stress as a possible trigger for increased frequency of PNES.   ICD interrogated and shows NO episodes of AF, AT, VF, or VT with no shocks delivered. Discussed with Cardiology Dr .Wynonia Lawman, who does not feel further work-up or admission is indicated from a cardiology perspective, which is reasonable. Otherwise, lab work reviewed as above. CBC with no lekocytosis or anemia. BMP is at baseline. Depakote level at low end of normal/therapeutic. UA shows mild ketonuria, likely 2/2 recent heavy diuresis by cardiologist. Of note, dig level <0.2 but she admits she just had this refilled and has been out for several days - still, no episodes of arrhythmia on ICD makes cardiac etiology unlikely. D/w Neurology Dr. [name] Kindred. He recommends CT WWO contrast,  depakote load, and d/c if CT negative. Pt in agreement.  Depakote loaded.  CT head shows NAICA, no masses or lesions. Pt remains well appearing and ambulatory. Now cleared by both Cariology and Neurology. Will advise adherence with dig and outpt follow-up. Will have pt start taking her depakote BID with 250 mg qAM and 500 mg qHS until Neurology f/u, as per Dr. [name] Kindred. Instructions provided verbally to pt and she has available Rx at home for this. Return precautions given.  Final Clinical Impressions(s) / ED Diagnoses   Final diagnoses:  Spells [facility])    New Prescriptions Discharge Medication List as of 02/18/2016 10:48 PM         Duffy Bruce, MD 02/19/16 1241

## 2016-02-18 NOTE — ED Notes (Signed)
Pt ambulated per RN

## 2016-02-18 NOTE — ED Notes (Signed)
Pt given ginger ale.

## 2016-02-18 NOTE — ED Notes (Signed)
CBG 88 

## 2016-02-18 NOTE — ED Notes (Signed)
EDP at bedside  

## 2016-02-18 NOTE — Telephone Encounter (Signed)
Returned pt call.. Left message.. Pt called because she wa running late for a 12:00 appt

## 2016-02-18 NOTE — ED Notes (Signed)
Interrogated [employer] device 

## 2016-02-19 NOTE — Discharge Instructions (Signed)
Start taking your depakote as 250 mg in the morning and 500 mg at night and follow-up with your doctor to discuss further management

## 2016-02-23 ENCOUNTER — Encounter: Payer: Self-pay | Admitting: Internal Medicine

## 2016-02-23 ENCOUNTER — Ambulatory Visit (INDEPENDENT_AMBULATORY_CARE_PROVIDER_SITE_OTHER): Payer: Medicare Other | Admitting: Internal Medicine

## 2016-02-23 VITALS — BP 90/60 | HR 80 | Ht 67.0 in | Wt 235.0 lb

## 2016-02-23 DIAGNOSIS — J387 Other diseases of larynx: Secondary | ICD-10-CM

## 2016-02-23 DIAGNOSIS — K761 Chronic passive congestion of liver: Secondary | ICD-10-CM | POA: Diagnosis not present

## 2016-02-23 DIAGNOSIS — R932 Abnormal findings on diagnostic imaging of liver and biliary tract: Secondary | ICD-10-CM | POA: Diagnosis not present

## 2016-02-23 DIAGNOSIS — R110 Nausea: Secondary | ICD-10-CM

## 2016-02-23 DIAGNOSIS — K219 Gastro-esophageal reflux disease without esophagitis: Secondary | ICD-10-CM

## 2016-02-23 MED ORDER — OMEPRAZOLE 40 MG PO CPDR: 40.0000 mg | DELAYED_RELEASE_CAPSULE | Freq: Two times a day (BID) | ORAL | 3 refills | Status: DC

## 2016-02-23 NOTE — Patient Instructions (Signed)
  We have sent the following medications to your pharmacy for you to pick up at your convenience: Omeprazole    Follow up with Dr Carlean Purl in 3 months.     I appreciate the opportunity to care for you. Silvano Rusk, MD, [facility]

## 2016-02-23 NOTE — Progress Notes (Signed)
[name] 45 y.o. December 27, 1970 [id number]  Assessment & Plan:   1. Chronic nausea   2. Laryngopharyngeal reflux (LPR)?   3. Hepatic congestion   4. Abnormal liver CT    Very complicated patient with multiple echo problems most notably severe bilateral congestive heart failure. I suspect her nausea is multifactorial. S2 heart failure and generalized illness might do this, medications may be causing it. She has some symptoms that are suggestive of possible laryngeal pharyngeal reflux though I doubt that's causing all of her symptoms with respect to nausea and this productive cough etc.  In going to try high dose twice a day PPI omeprazole 40 mg twice a day and see her back in 3 months. If that is not helpful I would back her back down to one PPI a day. We discussed the possibility of pH probe testing but I don't think that that is worthwhile.  Her right upper quadrant pain is coming from hepatic congestion I think. She could have cardiac cirrhosis. Not sure what different we would do if she did have that. Could consider a screening upper endoscopy looking for varices. With her hepatic congestion I think hepatic elastograophy may not be valid. He cannot have an MRI due to defibrillator in place.Dr. Haroldine Laws mention the possibility of a right heart catheterization recently and I wonder if he can do a hepatic wedge pressure. That would help [place] understand if she had portal hypertension as well. I will ask him.  I explained her situation to her and her husband as best I could today. She seemed to accept my assessment.  I appreciate the opportunity to care for this patient. CC: Mauricio Po, FNP Glori Bickers M.D.  Subjective:   Chief Complaint: Nausea HPI Patient is here with her husband. She has a complicated past medical history as includes biventricular heart failure. She has an AICD. For 3 years or more she is experienced daily nausea. When she wakes up in the morning she has  to cough up some yellowish phlegm-like material. Sometimes its flecked with blood. Once she does that she feels somewhat better. She does say she has a postnasal drip problem. Then throughout the day she will continue to have intermittent nausea. Quite bad. It is resisted multiple treatments. Sometimes she'll get a swelling in her upper quadrant and a pressure sensation there. He saw Dr. Janace Hoard of otolaryngology he treated with antibiotics and then said she might have sinusitis and a CT scan apparently showed that in some of her sinuses. However treatments have not helped.  She saw Dr. Collene Mares for a colonoscopy in 2012 which was negative. She returned with some of these complaints but felt like her issues were not addressed so she came to see me instead.  She has intermittent hoarseness but says that's related to a conversion disorder that she has. She also has episodic seizures or pseudoseizures syndrome.  Omeprazole seems to control her heartburn at this time at current dose.  Allergies  Allergen Reactions  . Ace Inhibitors Other (See Comments)    hyperkalemia  . Losartan Other (See Comments)    hyperkalemia  . Reglan [Metoclopramide Hcl] Anxiety  . Adhesive [Tape] Itching and Rash  . Other Itching and Rash    Chloraprep  . Chlorhexidine Itching and Rash   Outpatient Medications Prior to Visit  Medication Sig Dispense Refill  . acetaminophen (TYLENOL) 325 MG tablet Take 2 tablets (650 mg total) by mouth every 4 (four) hours as needed for headache or  mild pain.    Marland Kitchen albuterol (PROVENTIL HFA;VENTOLIN HFA) 108 (90 Base) MCG/ACT inhaler Inhale 1-2 puffs into the lungs daily.    . baclofen (LIORESAL) 10 MG tablet TAKE 1 TABLET BY MOUTH 2 (TWO) TIMES DAILY AS NEEDED FOR MUSCLE SPASMS. 60 tablet 0  . beclomethasone (QVAR) 80 MCG/ACT inhaler Inhale 2 puffs into the lungs 2 (two) times daily.     . carvedilol (COREG) 25 MG tablet Take 0.5 tablets (12.5 mg total) by mouth 2 (two) times daily with a  meal. 60 tablet 6  . Cholecalciferol (VITAMIN D3) 5000 UNITS CAPS Take 1 capsule by mouth daily.     . Coenzyme Q10 (CO Q-10) 100 MG CAPS Take 100 mg by mouth daily.    . Cyanocobalamin 2500 MCG SUBL Place 2,500 mcg under the tongue daily.    . digoxin (LANOXIN) 0.125 MG tablet TAKE 1 TABLET BY MOUTH EVERY DAY 30 tablet 2  . divalproex (DEPAKOTE ER) 250 MG 24 hr tablet Take 250 mg by mouth every evening.     . ferrous sulfate 325 (65 FE) MG tablet Take 325 mg by mouth daily with breakfast.    . GLUCOSAMINE-CHONDROITIN PO Take 1,200 mg by mouth daily.    . hydrOXYzine (ATARAX/VISTARIL) 25 MG tablet Take 25 mg by mouth at bedtime as needed for itching (DOESN'T TAKE WITH TRAZODONE).    Marland Kitchen ivabradine (CORLANOR) 5 MG TABS tablet Take 1 tablet (5 mg total) by mouth 2 (two) times daily with a meal. 60 tablet 6  . loratadine (CLARITIN) 10 MG tablet Take 10 mg by mouth daily.    Marland Kitchen losartan (COZAAR) 25 MG tablet TAKE 0.5 TABLETS (12.5 MG TOTAL) BY MOUTH DAILY. (Patient taking differently: Take 12.5 mg by mouth every evening. TAKE 0.5 TABLETS (12.5 MG TOTAL) BY MOUTH DAILY.) 45 tablet 3  . metolazone (ZAROXOLYN) 2.5 MG tablet Take 2.5 mg by mouth daily as needed (edema).    . Multiple Vitamin (MULTIVITAMIN WITH MINERALS) TABS Take 1 tablet by mouth daily. Reported on 10/13/2015    . ondansetron (ZOFRAN ODT) 4 MG disintegrating tablet Take 1 tablet (4 mg total) by mouth every 8 (eight) hours as needed for nausea or vomiting. 20 tablet 0  . oxyCODONE (ROXICODONE) 5 MG immediate release tablet Take 1 tablet (5 mg total) by mouth every 4 (four) hours as needed for severe pain. 24 tablet 0  . PATADAY 0.2 % SOLN Place 1 drop into both eyes daily.     . pentosan polysulfate (ELMIRON) 100 MG capsule Take 200 mg by mouth daily.    . potassium chloride SA (K-DUR,KLOR-CON) 20 MEQ tablet Take 40 mEq by mouth daily.     . pregabalin (LYRICA) 200 MG capsule Take 1 capsule (200 mg total) by mouth 2 (two) times daily. (Patient  taking differently: Take 200 mg by mouth every evening. ) 60 capsule 5  . spironolactone (ALDACTONE) 25 MG tablet Take 0.5 tablets (12.5 mg total) by mouth daily. 45 tablet 6  . torsemide (DEMADEX) 20 MG tablet Take 2 tablets (40 mg total) by mouth daily. 60 tablet 6  . traMADol (ULTRAM) 50 MG tablet Take 50 mg by mouth daily as needed for moderate pain.     . traZODone (DESYREL) 50 MG tablet Take 50 mg by mouth as needed. FOR SLEEP    . omeprazole (PRILOSEC) 20 MG capsule TAKE 1 CAPSULE BY MOUTH DAILY. 30 capsule 5   No facility-administered medications prior to visit.    Past Medical  History:  Diagnosis Date  . Acne   . Anemia 1980's   [id number]  . Anxiety   . Arthritis   . 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 Blood transfusion 1980's   1987 or 1988  . Breast calcifications    right breast  . Breast cancer (HCC)    right;  . 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 Cardiac MRI 05/2011 EF 32%. ICD implantation 10/2011 ([employer])  . 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 Conversion disorder   . 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 Depression   . Endometriosis   . Fibromyalgia   . GERD (gastroesophageal reflux disease)   . Hepatomegaly   . History of stomach ulcers   . Hyperlipidemia   . Hypertension    c/b orthostatic hypotention  . ICD (implantable cardiac defibrillator) in place   . Insomnia   . Interstitial cystitis   . Left eye injury 12/2014  . 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   . Nonischemic cardiomyopathy ([place])    related to chemo; EF 30% 05/2011  . Obesity   . Palpitation    normal sinus rhythm only on 21 day heart monitor  . Panic attacks   . Peripheral neuropathy (HCC)    chemo- induced  . Pneumonia 2000's   "once"  . Seasonal allergies   . Type II diabetes mellitus ([place])     Past Surgical History:  Procedure Laterality Date  . ABDOMINAL HYSTERECTOMY     partial  . BREAST LUMPECTOMY  2008; 2013   right  . CARDIAC DEFIBRILLATOR PLACEMENT  11/03/11  . CESAREAN SECTION    . CHOLECYSTECTOMY  ~ 2009  . COLONOSCOPY    . IMPLANTABLE CARDIOVERTER DEFIBRILLATOR IMPLANT N/A 11/03/2011   Procedure: IMPLANTABLE CARDIOVERTER DEFIBRILLATOR IMPLANT;  Surgeon: Deboraha Sprang, MD; MDT   . LAPAROSCOPIC ENDOMETRIOSIS FULGURATION  1990's  . NASAL SINUS SURGERY    . [place] REMOVAL  2010?   left chest; placed in 2008  . TONSILLECTOMY  1980's  . TUBAL LIGATION  1990's   Social History   Social History  . Marital status: Married    Spouse name: [name]  . Number of children: 1  . Years of education: 20   Occupational History  . Disablity    Social History Main Topics  . Smoking status: Never Smoker  . Smokeless tobacco: Never Used  . Alcohol use Yes     Comment: 11/03/11 "maybe once a month"  . Drug use: No  . Sexual activity: Not Asked   Other Topics Concern  . None   Social History Narrative   Patient is married [name]) and lives at home with her family.   Patient has one child. A daughter   Patient is right-handed.   Patient has a college education.   Patient drinks some caffeine occasionally, but not everyday.   Regular exercise   She is disabled   02/23/2016   Updated   Family History  Problem Relation Age of Onset  . Heart disease Mother   . Arthritis Mother   . Hypertension Mother   . Stroke Mother   . Fibromyalgia Mother   . Coronary artery disease    . Heart attack    . Heart disease Maternal Uncle   . Colon cancer Paternal Uncle   . Heart disease Maternal Grandmother   .  Ovarian cancer Paternal Grandmother   . Fibromyalgia Sister        Review of Systems As per history of present illness. All other review of systems negative.  Objective:   Physical Exam @BP  90/60 (BP Location: Left Arm, Patient Position: Sitting, Cuff  Size: Normal)   Pulse 80   Ht 5\' 7"  (1.702 m)   Wt 235 lb (106.6 kg)   BMI 36.81 kg/m @  General:  Well-developed, well-nourished and in no acute distress Eyes:  anicteric. ENT:   Mouth and posterior pharynx free of lesions.  Neck:   supple w/o thyromegaly or mass.  Lungs: Clear to auscultation bilaterally. Chest wall: Fibrillator implanted in the left upper chest Heart:  S1S2, no rubs, murmurs, gallops. Abdomen:  obese soft, non-tender, no hepatosplenomegaly, hernia, or mass and BS+.  Lymph:  no cervical or supraclavicular adenopathy. Extremities:   no edema, cyanosis or clubbing  Neuro:  A&O x 3.  Psych:  appropriate mood and  Affect.   Data Reviewed:n images are reviewed personally CT of the abdomen and pelvis without contrast 01/19/2016 IMPRESSION: 1. Nonspecific changes in the retroperitoneum, of mesenteric root, and peritoneal cavity. Consider pancreatitis or anasarca. 2. No bowel obstruction. 3. Enlarged fatty liver. Stable right hepatic lobe lesion, likely a cyst. 4. Question of cirrhosis. 5. Cardiomegaly, of small pericardial effusion, and probable right heart failure ( dilated inferior vena cava). 6. Status post cholecystectomy. 7. Status post hysterectomy. CT of the abdomen and pelvis with and without contrast 04/01/2015 1. Stable enhancing lesion within the medial segment of left lobe of liver dating back to 2013 which is favored to represent a benign process. 2. No change in low-attenuation structure within the right lobe of liver. Likely cysts. 3. Hepatic steatosis. 4. Evidence of passive venous congestion of the liver with reflux of contrast material from the right heart into the hepatic veins. 5. Hepatic steatosis with enlargement of the caudate lobe and lateral segment of left lobe. 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 Clinical correlation recommended.   I reviewed recent emergency room notes from 02/18/2016, labs in the EMR, and recent  congestive heart failure notes from August 2017. I reviewed echocardiogram reports. I reviewed 2010 notes and 2010colonoscopy from Dr. Collene Mares and 2016 notes from Dr. Collene Mares.

## 2016-02-24 ENCOUNTER — Encounter (HOSPITAL_COMMUNITY): Payer: Self-pay

## 2016-02-25 ENCOUNTER — Telehealth: Payer: Self-pay | Admitting: Neurology

## 2016-02-25 ENCOUNTER — Other Ambulatory Visit: Payer: Self-pay | Admitting: Neurology

## 2016-02-25 ENCOUNTER — Other Ambulatory Visit (HOSPITAL_COMMUNITY): Payer: Self-pay | Admitting: Internal Medicine

## 2016-02-25 DIAGNOSIS — R569 Unspecified convulsions: Secondary | ICD-10-CM

## 2016-02-25 DIAGNOSIS — G253 Myoclonus: Secondary | ICD-10-CM

## 2016-02-25 DIAGNOSIS — F444 Conversion disorder with motor symptom or deficit: Secondary | ICD-10-CM

## 2016-02-25 DIAGNOSIS — G241 Genetic torsion dystonia: Secondary | ICD-10-CM

## 2016-02-25 NOTE — Telephone Encounter (Signed)
Pt called said she was seen in the ED on 02/18/16 and Dr D had increased depakote to 1 in am and 2 in pm. I advised her Dr D was not in the office on 02/18/16. In looking over ED notes Dr Isaac-(ED provider) he increased the medication and advised he would give her enough until her appt with Dr D. I advised the pt to call him and request the RX to be sent to her pharmacy. The pt was last seen at [facility] 7/16.

## 2016-02-26 MED ORDER — DIVALPROEX SODIUM ER 250 MG PO TB24: ORAL_TABLET | ORAL | 0 refills | Status: DC

## 2016-02-26 NOTE — Telephone Encounter (Signed)
I called pt to discuss her dilantin refill. She reports that she was seen in the hospital last week for seizures. At that time, the ER increased her depakote ER 250 mg to 1 tablet in the morning and 2 tablets in the evening. Per the ER note, "Will have pt start taking her depakote BID with 250 mg qAM and 500 mg qHS until Neurology f/u, as per Dr. [name] Kindred."  Pt has an appt with Dr. Brett Fairy on 03/30/2016. Will send this message to Dr. Brett Fairy.

## 2016-03-01 ENCOUNTER — Other Ambulatory Visit: Payer: Self-pay

## 2016-03-05 ENCOUNTER — Ambulatory Visit (HOSPITAL_BASED_OUTPATIENT_CLINIC_OR_DEPARTMENT_OTHER): Payer: Medicare Other | Admitting: Adult Health

## 2016-03-05 ENCOUNTER — Telehealth: Payer: Self-pay | Admitting: Oncology

## 2016-03-05 ENCOUNTER — Encounter: Payer: Self-pay | Admitting: Adult Health

## 2016-03-05 VITALS — BP 101/49 | HR 62 | Temp 98.7°F | Resp 20 | Wt 235.4 lb

## 2016-03-05 DIAGNOSIS — C50911 Malignant neoplasm of unspecified site of right female breast: Secondary | ICD-10-CM

## 2016-03-05 DIAGNOSIS — R5383 Other fatigue: Secondary | ICD-10-CM | POA: Diagnosis not present

## 2016-03-05 DIAGNOSIS — Z853 Personal history of malignant neoplasm of breast: Secondary | ICD-10-CM

## 2016-03-05 DIAGNOSIS — Z1231 Encounter for screening mammogram for malignant neoplasm of breast: Secondary | ICD-10-CM

## 2016-03-05 NOTE — Addendum Note (Signed)
Addended by: [employer] D on: 03/05/2016 12:44 PM   Modules accepted: Orders

## 2016-03-05 NOTE — Progress Notes (Signed)
CLINIC:  Survivorship   REASON FOR VISIT:  Routine follow-up for history of breast cancer.   BRIEF ONCOLOGIC HISTORY:  (from Krystal Fitz, NP's last visit on 10/24/14)   INTERVAL HISTORY:  [name] presents to the [facility] today for routine follow-up for her history of breast cancer.  She was previously scheduled to see Dr. Jana Hakim in 10/2015, but no showed for this visit.    Since her last visit to the cancer center, she has had 4 visits to the ED for different issues.  Her most recent visit was on 02/18/16 for near syncope and seizures.  She was also seen in the ED about a month prior to that for RUQ abdominal pain that radiates to her back.  Both of these issues are longstanding, well-known concerns for the patient.    She has many complex comorbid conditions for which she sees several specialists. She has a GI specialist (Dr. Carlean Purl) as well as a neurologist (Dr. Brett Fairy) that she sees often for seizure disorder.  She has conversion disorder.  She has congestive heart failure related to her chemotherapy for breast cancer; she has a defibrillator; last ECHO was on 10/13/15 with EF 20-25%.     Her biggest complaint today is fatigue and not being able to be independent in her care/daily activities.  She has no specific breast concerns.  Last mammogram was done on 01/15/16 and was negative.  She would like to know if she can have an order to visit "[facility]" for bra measuring and prosthesis s/p breast surgery and subsequent radiation therapy about 9 years ago.  She states she has never been to the [facility] and is interested in a prosthesis to help with cosmesis.   She is also interested in some supplements and wants to know if they are safe to take with her medications.     REVIEW OF SYSTEMS:  Per HPI.     PAST MEDICAL/SURGICAL HISTORY:  Past Medical History:  Diagnosis Date  . Acne   . Anemia 1980's   [id number]  . Anxiety   . Arthritis   .  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 Blood transfusion 1980's   1987 or 1988  . Breast calcifications    right breast  . Breast cancer (HCC)    right;  . CHF (congestive heart failure) ([place])    due to non-ischemic cardiomyopathy, thought to be chemotherapy induced;  cath 7/12: normal cors, EF 20-25%. Cardiac MRI 05/2011 EF 32%. ICD implantation 10/2011 ([employer])  . 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 Conversion disorder   . 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 Depression   . Endometriosis   . Fibromyalgia   . GERD (gastroesophageal reflux disease)   . Hepatomegaly   . History of stomach ulcers   . Hyperlipidemia   . Hypertension    c/b orthostatic hypotention  . ICD (implantable cardiac defibrillator) in place   . Insomnia   . Interstitial cystitis   . Left eye injury 12/2014  . 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   . Nonischemic cardiomyopathy ([place])    related to chemo; EF 30% 05/2011  . Obesity   . Palpitation    normal sinus rhythm only on 21 day heart monitor  . Panic attacks   . Peripheral neuropathy (HCC)    chemo- induced  . Pneumonia 2000's   "once"  . Seasonal allergies   . Type II  diabetes mellitus ([place])    Past Surgical History:  Procedure Laterality Date  . ABDOMINAL HYSTERECTOMY     partial  . BREAST LUMPECTOMY  2008; 2013   right  . CARDIAC DEFIBRILLATOR PLACEMENT  11/03/11  . CESAREAN SECTION    . CHOLECYSTECTOMY  ~ 2009  . COLONOSCOPY    . IMPLANTABLE CARDIOVERTER DEFIBRILLATOR IMPLANT N/A 11/03/2011   Procedure: IMPLANTABLE CARDIOVERTER DEFIBRILLATOR IMPLANT;  Surgeon: Deboraha Sprang, MD; MDT   . LAPAROSCOPIC ENDOMETRIOSIS FULGURATION  1990's  . NASAL SINUS SURGERY    . [place] REMOVAL  2010?   left chest; placed in 2008  . TONSILLECTOMY  1980's  . TUBAL LIGATION  1990's     ALLERGIES:  Allergies  Allergen Reactions  . Ace Inhibitors Other (See Comments)     hyperkalemia  . Losartan Other (See Comments)    hyperkalemia  . Reglan [Metoclopramide Hcl] Anxiety  . Adhesive [Tape] Itching and Rash  . Other Itching and Rash    Chloraprep  . Chlorhexidine Itching and Rash     CURRENT MEDICATIONS:  Outpatient Encounter Prescriptions as of 03/05/2016  Medication Sig Note  . acetaminophen (TYLENOL) 325 MG tablet Take 2 tablets (650 mg total) by mouth every 4 (four) hours as needed for headache or mild pain.   Marland Kitchen albuterol (PROVENTIL HFA;VENTOLIN HFA) 108 (90 Base) MCG/ACT inhaler Inhale 1-2 puffs into the lungs daily.   . baclofen (LIORESAL) 10 MG tablet TAKE 1 TABLET BY MOUTH 2 (TWO) TIMES DAILY AS NEEDED FOR MUSCLE SPASMS.   Marland Kitchen beclomethasone (QVAR) 80 MCG/ACT inhaler Inhale 2 puffs into the lungs 2 (two) times daily.    . carvedilol (COREG) 25 MG tablet Take 0.5 tablets (12.5 mg total) by mouth 2 (two) times daily with a meal.   . Cholecalciferol (VITAMIN D3) 5000 UNITS CAPS Take 1 capsule by mouth daily.    . Coenzyme Q10 (CO Q-10) 100 MG CAPS Take 100 mg by mouth daily.   . Cyanocobalamin 2500 MCG SUBL Place 2,500 mcg under the tongue daily.   . digoxin (LANOXIN) 0.125 MG tablet TAKE 1 TABLET BY MOUTH EVERY DAY   . divalproex (DEPAKOTE ER) 250 MG 24 hr tablet Take 1 tablet in the morning and 2 tablets at bedtime.   . ferrous sulfate 325 (65 FE) MG tablet Take 325 mg by mouth daily with breakfast.   . GLUCOSAMINE-CHONDROITIN PO Take 1,200 mg by mouth daily.   . hydrOXYzine (ATARAX/VISTARIL) 25 MG tablet Take 25 mg by mouth at bedtime as needed for itching (DOESN'T TAKE WITH TRAZODONE).   Marland Kitchen ivabradine (CORLANOR) 5 MG TABS tablet Take 1 tablet (5 mg total) by mouth 2 (two) times daily with a meal.   . loratadine (CLARITIN) 10 MG tablet Take 10 mg by mouth daily.   Marland Kitchen losartan (COZAAR) 25 MG tablet TAKE 0.5 TABLETS (12.5 MG TOTAL) BY MOUTH DAILY. (Patient taking differently: Take 12.5 mg by mouth every evening. TAKE 0.5 TABLETS (12.5 MG TOTAL) BY MOUTH  DAILY.)   . metolazone (ZAROXOLYN) 2.5 MG tablet Take 2.5 mg by mouth daily as needed (edema). 10/13/2015: Takes 1-2 times per month   . Multiple Vitamin (MULTIVITAMIN WITH MINERALS) TABS Take 1 tablet by mouth daily. Reported on 10/13/2015   . omeprazole (PRILOSEC) 40 MG capsule Take 1 capsule (40 mg total) by mouth 2 (two) times daily before a meal. Breakfast and supper   . ondansetron (ZOFRAN ODT) 4 MG disintegrating tablet Take 1 tablet (4 mg total) by mouth every  8 (eight) hours as needed for nausea or vomiting.   Marland Kitchen oxyCODONE (ROXICODONE) 5 MG immediate release tablet Take 1 tablet (5 mg total) by mouth every 4 (four) hours as needed for severe pain. (Patient not taking: Reported on 03/05/2016)   . PATADAY 0.2 % SOLN Place 1 drop into both eyes daily.    . pentosan polysulfate (ELMIRON) 100 MG capsule Take 200 mg by mouth daily.   . potassium chloride SA (K-DUR,KLOR-CON) 20 MEQ tablet Take 40 mEq by mouth daily.  02/18/2016: Taking 2 tabs of torsemide and 2 tabs of klor-con daily for 2 weeks, unsure when she is supposed to go back to original dosing of 1 tab and 1 tab daily?  . pregabalin (LYRICA) 200 MG capsule Take 1 capsule (200 mg total) by mouth 2 (two) times daily. (Patient taking differently: Take 200 mg by mouth every evening. )   . spironolactone (ALDACTONE) 25 MG tablet Take 0.5 tablets (12.5 mg total) by mouth daily.   Marland Kitchen torsemide (DEMADEX) 20 MG tablet Take 2 tablets (40 mg total) by mouth daily. 02/18/2016: Taking 2 tabs of torsemide and 2 tabs of klor-con daily for 2 weeks, unsure when she is supposed to go back to original dosing of 1 tab and 1 tab daily?   . traMADol (ULTRAM) 50 MG tablet Take 50 mg by mouth daily as needed for moderate pain.    . traZODone (DESYREL) 50 MG tablet Take 50 mg by mouth as needed. FOR SLEEP    No facility-administered encounter medications on file as of 03/05/2016.      ONCOLOGIC FAMILY HISTORY:  Family History  Problem Relation Age of Onset  . Heart  disease Mother   . Arthritis Mother   . Hypertension Mother   . Stroke Mother   . Fibromyalgia Mother   . Coronary artery disease    . Heart attack    . Heart disease Maternal Uncle   . Colon cancer Paternal Uncle   . Heart disease Maternal Grandmother   . Ovarian cancer Paternal Grandmother   . Fibromyalgia Sister     GENETIC COUNSELING/TESTING: No records available for review.   SOCIAL HISTORY:  Irlene Crudup is married and lives with her husband and 83 year old daughter in [place], [place].  She denies any current tobacco or illicit drug use.  She drinks alcohol occasionally.    PHYSICAL EXAMINATION:  Vital Signs: Vitals:   03/05/16 1230  BP: (!) 101/49  Pulse: 62  Resp: 20  Temp: 98.7 F (37.1 C)   Filed Weights   03/05/16 1230  Weight: 235 lb 6.4 oz (106.8 kg)   General: Chronically-ill appearing female in no acute distress.  She is accompanied by her husband today.   HEENT: Head is normocephalic.  Pupils equal and reactive to light. Conjunctivae clear without exudate.  Sclerae anicteric. Oral mucosa is pink, moist.  Oropharynx is pink without lesions or erythema.  Lymph: No cervical, supraclavicular, or infraclavicular lymphadenopathy noted on palpation.  No appreciable right arm lymphedema.  Cardiovascular: Regular rate and rhythm.Marland Kitchen Respiratory: Clear to auscultation bilaterally. Chest expansion symmetric; breathing non-labored.  Breast Exam:  -Left breast: No appreciable masses on palpation. No skin redness, thickening, or peau d'orange appearance; no nipple retraction or nipple discharge.  -Right breast: Substantially smaller in size when compared to the left; no appreciable masses on palpation. No skin redness, thickening, or peau d'orange appearance; no nipple retraction or nipple discharge; healed scar without erythema or nodularity. -Axilla: No axillary adenopathy bilaterally.  GI: Abdomen soft and round; mild abdominal distension; mild tenderness to palpation  to RUQ; bowel sounds normoactive.    GU: Deferred.  Neuro:  Steady gait.  Psych: Mood and affect normal and appropriate for situation.  Extremities: No edema. Skin: Warm and dry.  LABORATORY DATA:  None for this visit   DIAGNOSTIC IMAGING:  Most recent mammogram: 01/15/16 EXAM: 2D DIGITAL SCREENING BILATERAL MAMMOGRAM WITH CAD AND ADJUNCT TOMO  COMPARISON:  Previous exam(s).  ACR Breast Density Category c: The breast tissue is heterogeneously dense, which may obscure small masses.  FINDINGS: There are no findings suspicious for malignancy. Post operative changes are seen in the rightbreast. Images were processed with CAD.  IMPRESSION: No mammographic evidence of malignancy. A result letter of this screening mammogram will be mailed directly to the patient.  RECOMMENDATION: Screening mammogram in one year. (Code:SM-B-01Y)  BI-RADS CATEGORY  1: Negative.     ASSESSMENT AND PLAN:  Ms.. Ranney is a pleasant 45 y.o. female with history of Stage IIB right breast invasive ductal carcinoma, ER+/PR+/HER2-, diagnosed in 08/2006, treated with lumpectomy & ALND, where 1/17 LNs was positive for metastatic carcinoma.  She went on to participate in the ECOG study for adjuvant chemotherapy (arm D), which consisted of Adriamycin/Cytoxan x 4 cycles, followed by Taxol/Avastin; chemo was stopped d/t cardiomyopathy with EF decreasing to 35%.  She went on to complete adjuvant radiation therapy and 5 years of anti-estrogen therapy with Letrozole from 05/2007-08/2011.  She presents to the Survivorship Clinic for surveillance and routine follow-up.   1. History of Stage IIB right breast cancer:  Ms. Alfredo is currently clinically and radiographically without evidence of disease or recurrence of breast cancer. Her mammogram in 01/2016 was negative, which is encouraging.  She is now more than 9 years out from her cancer diagnosis and treatment.  We discussed the opportunity for her to "graduate" from  follow-up at the cancer center.  Ms. Hoskinson has many concerns about not being seen at the cancer center, given her continued struggles with sequelae of treatment and other conditions.  I shared with her that often when patients are this far out from treatment and are doing well (meaning no evidence of recurrence for many years), then we can consider having them followed by their PCP for continued breast cancer screening/surveillance.  This plan made Ms. Melone uncomfortable and she is not ready to "graduate."  She wants to know how we know that she doesn't have cancer.  I explained that while it is certainly possible for her to have micrometastatic disease somewhere in her body, it has not been measurable in any of the imaging she has had done for her other conditions, which is reassuring.  I explained the rationale for why we do not routinely perform scans like CT or PET scans for breast cancer, as they are not very helpful and may often give false positive results due to other conditions.  Mammograms and clinical breast exams annually remain the standard of care. I explained that should she have a new symptom that we or her other specialists consider to be related to her history of cancer and have a concern for recurrent disease or metastatic disease, then certainly appropriate imaging would be ordered at that time.  She is satisfied with this answer.  She continues to have a lot of questions about what treatments she actually received, what her treatment plan was, when/what chemotherapy agents she received.  I offered to put together a short treatment summary for  her, if I am able to locate her records from 2008 (pre-EPIC documentation) and I would mail it to her home.  She was very appreciative of this offer and would like that done.  I let her know that I would work on this in the coming weeks and get it to her as soon as possible.  In the meantime, since she is anxious about "graduating" from follow-up here at  the cancer center, we will continue to see her annually in the Survivorship Clinic.  I let her know that should there be a new issue that I am concerned about, then I can certainly bring Dr. Jana Hakim back into her care for further evaluation. For now, she will proceed with follow-up in survivorship only.  We will offer having her "graduate" from follow-up next year, which will be 10 years since her cancer diagnosis.  This will allow her more time to accept the idea of no longer being followed at the cancer center.  For now, we will see her next year and appointments have been scheduled.  She agreed with this plan.    2. Concerns regarding breast cosmesis: I completed an order for her to receive bra and prosthesis for her right breast, which is considerably smaller than the left breast since completing treatment for breast cancer.  Orders faxed directly to "[facility]."     3. Multiple comorbid conditions & Fatigue: The etiology of her chronic fatigue is likely multifactorial.  I explained that heart failure can certainly create fatigue; several of her medications can be sedating and thus cause fatigue.  I stressed the importance of maintaining follow-up with her specialists, particularly Drs. Carlean Purl (GI), Dohmeier (Neurology), and Bensimhon (Cardiology).  She understands how critical these specialists are in her care and will maintain follow-up with them.  She will see Dr. Haroldine Laws & Dr. Brett Fairy in 03/2016; she will see Dr. Carlean Purl in 05/2016.   I defer recommendations for her care regarding these conditions and the patient agrees with this plan.     4. Herbal supplements: She specifically has questions about drinking wheat grass and taking vitamin B supplements.  I explained that generally wheat grass and B vitamins are benign and usually do not cause interactions with medications, to my knowledge. However, I did recommend that she either speak with a pharmacist or her specialists before starting any  supplements.  I explained that there are often supplements that do interfere with medications, particularly those taken for psychiatric and seizure disorders (of which she has both).  We generally recommend that patients get the extra nutrients they need from food, rather than from oral medications since many of the supplements are not regulated with the same rigor as prescription drugs.  I cautioned her from buying any supplements over the internet, as we cannot validate the safety of these supplements or their ingredients.  She voiced understanding and agreed to check with her pharmacist and specialists before adding any new supplements beyond wheat grass or B vitamins.    5. Health maintenance and wellness promotion: Ms. Cannedy was encouraged to consume 5-7 servings of fruits and vegetables per day. Exercise is difficult for her given her heart failure, seizure disorder, and conversion disorder.  Encouraged her to take short walks with frequent rest breaks with family or friends with her, in case of emergency.  She was instructed to limit her alcohol consumption and continue to abstain from tobacco use.    Dispo:  -Annual mammogram due in 01/2017; orders  placed today.  -Return to cancer center to see Survivorship NP in 02/2017   A total of 45 minutes of face-to-face time was spent with this patient with greater than 50% of that time in counseling and care-coordination.   Mike Craze, NP Survivorship Program [facility] [phone]   Note: PRIMARY CARE PROVIDER Mauricio Po, [place] [phone]

## 2016-03-05 NOTE — Telephone Encounter (Signed)
Gave patient avs report and appointments for September 2018. Patient to schedule annual mammo for August 2018.

## 2016-03-08 ENCOUNTER — Other Ambulatory Visit: Payer: Self-pay | Admitting: Family

## 2016-03-18 ENCOUNTER — Encounter (HOSPITAL_COMMUNITY): Payer: Self-pay | Admitting: Internal Medicine

## 2016-03-18 ENCOUNTER — Ambulatory Visit (HOSPITAL_COMMUNITY)
Admission: RE | Admit: 2016-03-18 | Discharge: 2016-03-18 | Disposition: A | Discharge status: Home / Self Care | Payer: Medicare Other | Source: Ambulatory Visit | Attending: Internal Medicine | Admitting: Internal Medicine

## 2016-03-18 VITALS — BP 100/62 | HR 62 | Wt 235.2 lb

## 2016-03-18 DIAGNOSIS — F419 Anxiety disorder, unspecified: Secondary | ICD-10-CM | POA: Diagnosis not present

## 2016-03-18 DIAGNOSIS — J449 Chronic obstructive pulmonary disease, unspecified: Secondary | ICD-10-CM | POA: Insufficient documentation

## 2016-03-18 DIAGNOSIS — I427 Cardiomyopathy due to drug and external agent: Secondary | ICD-10-CM | POA: Insufficient documentation

## 2016-03-18 DIAGNOSIS — Z8719 Personal history of other diseases of the digestive system: Secondary | ICD-10-CM | POA: Insufficient documentation

## 2016-03-18 DIAGNOSIS — Z853 Personal history of malignant neoplasm of breast: Secondary | ICD-10-CM | POA: Insufficient documentation

## 2016-03-18 DIAGNOSIS — M1990 Unspecified osteoarthritis, unspecified site: Secondary | ICD-10-CM | POA: Diagnosis not present

## 2016-03-18 DIAGNOSIS — K219 Gastro-esophageal reflux disease without esophagitis: Secondary | ICD-10-CM | POA: Diagnosis not present

## 2016-03-18 DIAGNOSIS — Z888 Allergy status to other drugs, medicaments and biological substances status: Secondary | ICD-10-CM | POA: Insufficient documentation

## 2016-03-18 DIAGNOSIS — E119 Type 2 diabetes mellitus without complications: Secondary | ICD-10-CM | POA: Diagnosis not present

## 2016-03-18 DIAGNOSIS — Z95810 Presence of automatic (implantable) cardiac defibrillator: Secondary | ICD-10-CM | POA: Insufficient documentation

## 2016-03-18 DIAGNOSIS — I110 Hypertensive heart disease with heart failure: Secondary | ICD-10-CM | POA: Diagnosis not present

## 2016-03-18 DIAGNOSIS — E785 Hyperlipidemia, unspecified: Secondary | ICD-10-CM | POA: Insufficient documentation

## 2016-03-18 DIAGNOSIS — M797 Fibromyalgia: Secondary | ICD-10-CM | POA: Diagnosis not present

## 2016-03-18 DIAGNOSIS — Z79899 Other long term (current) drug therapy: Secondary | ICD-10-CM | POA: Diagnosis not present

## 2016-03-18 DIAGNOSIS — I5022 Chronic systolic (congestive) heart failure: Secondary | ICD-10-CM | POA: Diagnosis not present

## 2016-03-18 DIAGNOSIS — F329 Major depressive disorder, single episode, unspecified: Secondary | ICD-10-CM | POA: Insufficient documentation

## 2016-03-18 LAB — BASIC METABOLIC PANEL
ANION GAP: 10 (ref 5–15)
BUN: 13 mg/dL (ref 6–20)
CALCIUM: 9.1 mg/dL (ref 8.9–10.3)
CO2: 26 mmol/L (ref 22–32)
Chloride: 99 mmol/L — ABNORMAL LOW (ref 101–111)
Creatinine, Ser: 0.89 mg/dL (ref 0.44–1.00)
GFR calc Af Amer: >60 mL/min (ref >60)
GLUCOSE: 84 mg/dL (ref 65–99)
Potassium: 3.9 mmol/L (ref 3.5–5.1)
Sodium: 135 mmol/L (ref 135–145)

## 2016-03-18 LAB — DIGOXIN LEVEL: Digoxin Level: 1 ng/mL (ref 0.8–2.0)

## 2016-03-18 NOTE — Progress Notes (Addendum)
Patient ID: [name], female   DOB: 03-Jul-1970, 45 y.o.   MRN: [id number]  ADV  Patient ID: [name], female   DOB: May 02, 1971, 45 y.o.   MRN: [id number]  Primary Cardiologist:  Dr. Glori Bickers Neurlogist: Dohmeir   PCP: Dr. Margaretha Sheffield Griffin/Noel Redmon, PAC   History of Present Illness: [name] is a 45y.o. female with a history of morbid obesity, depression, fibromyalgia, congestive heart failure secondary to nonischemic cardiomyopathy (EF 20-25%) related to her chemotherapy for breast cancer. Last chemo 2008. Evaluated by neuro for stuttering.  Felt to have a conversion disorder. Confirmed by [facility].   She presented to [facility] on 11/26/14 with near syncope and dyspnea.With + orthostatics in ER (SBP 104 to 87 from lying to sitting) She also had volume overload with acute on chronic CHF. BNP 1093.0. Troponins normal. Had 7 lb  gain over 2 days with dietary indiscretion of sodium.Admitted for IV diuretic therapy and monitoring of BP. Coreg reduced 12.5 mg BID. Symptoms improved and good diuresis with IV lasix therapy. I/Os net negative 6 L total. Was transitioned back to PO Torsemide once euvolemic. Discharge weight was 249 lb.  Evaluated by pulmonary and Dr Elsworth Soho told he saw no evidence of asthma.    She returns today for regular follow up. Says she is feeling much better. Recently started natural remedies. Now able to move around more. Energy level picking up slowly. Edema much improved. Still struggling with fibromyalgia. Taking torsemide 40 daily. Compliant with meds. Not requiring metolazone. No orthopnea or PND.   ICD interrogation: No VT/AF. Volume flat since August 23. Activity level going up to 1-2 hours per day since Sept 1.   Studies:  CPX Feb 2010. Peak VO2 was 14.5 which was 71% of predicted. When corrected for body weight the VO2 was 20.7. The slope was 27. RER 1.20. O2 pulse 73%. Overall this was felt to be only a very mild functional limitation due to her obesity  and mild circulatory limitation.  CPX 2/11: pVO2 13.0 (72%) correct for ideal wt 46ml/kg/min RER 1.06 (submax) slope 28.2 O2 pulse 91% - felt no signifcant cardiac limitation. + deconditioning.   Echo 04/2009  EF back down to 25%.  RHC normal. Echo 07/2009 showed EF 50%  Echo 06/2010 EF 40%. So we did MUGA EF 58% Echo 12/2010 40-45% Echo 05/2011 EF 30%. Grade 2 diastolic dysfunction Cath (12/28/10) was set up and demonstrated EF 20-25% and normal coronary arteries.  RA  4, RV 35/11, PA  28/8 (17), PCWP 8. Fick 5.2/2.4 PVR 1.7 [place]. Fick 5.2/2.4 Aortic saturation was 97%.  PA saturation was 67% and 68%. ECHO 09/03/13 EF 20-25%  ECHO 06/24/14 EF 20-25% Cardiac MRI on 07/19/11: Moderate to severe LVE, Diffuse hypokinesis. EF 32%, Mild LAE ECHO 10/13/15 EF 20-25% Mild to moderate RV dysfunction   Labs  09/03/13 k 3.3 Creatinine 0.86 Pro BNP 998 Dig level 0.7 05/17/14 K 4.7 Creatinine 0.9 pBNP 1007 digoxin 1.2 11/29/14 K 4.2 Creatinine 1.08 Dig 0.3 01/19/16  K 3.7 creatinine 0.90  SH: lived with her husband. Does not drive. Does not drink alcohol or smoke   Past Medical History:  Diagnosis Date  . Acne   . Anemia 1980's   [id number]  . Anxiety   . Arthritis   . 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 Blood transfusion 1980's   1987 or 1988  . Breast calcifications    right breast  . Breast cancer (HCC)    right;  . CHF (congestive heart failure) (  [place])    due to non-ischemic cardiomyopathy, thought to be chemotherapy induced;  cath 7/12: normal cors, EF 20-25%. Cardiac MRI 05/2011 EF 32%. ICD implantation 10/2011 (Medtronic)  . 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 Conversion disorder   . 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 Depression   . Endometriosis   . Fibromyalgia   . GERD (gastroesophageal reflux disease)   . Hepatomegaly   . History of stomach ulcers   . Hyperlipidemia   . Hypertension    c/b orthostatic hypotention  . ICD (implantable cardiac defibrillator) in place   . Insomnia    . Interstitial cystitis   . Left eye injury 12/2014  . 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   . Nonischemic cardiomyopathy ([place])    related to chemo; EF 30% 05/2011  . Obesity   . Palpitation    normal sinus rhythm only on 21 day heart monitor  . Panic attacks   . Peripheral neuropathy (HCC)    chemo- induced  . Pneumonia 2000's   "once"  . Seasonal allergies   . Type II diabetes mellitus ([place])    Current Outpatient Prescriptions on File Prior to Encounter  Medication Sig Dispense Refill  . acetaminophen (TYLENOL) 325 MG tablet Take 2 tablets (650 mg total) by mouth every 4 (four) hours as needed for headache or mild pain.    Marland Kitchen albuterol (PROVENTIL HFA;VENTOLIN HFA) 108 (90 Base) MCG/ACT inhaler Inhale 1-2 puffs into the lungs daily.    . beclomethasone (QVAR) 80 MCG/ACT inhaler Inhale 2 puffs into the lungs 2 (two) times daily.     . carvedilol (COREG) 25 MG tablet Take 0.5 tablets (12.5 mg total) by mouth 2 (two) times daily with a meal. 60 tablet 6  . Cholecalciferol (VITAMIN D3) 5000 UNITS CAPS Take 1 capsule by mouth daily.     . Coenzyme Q10 (CO Q-10) 100 MG CAPS Take 100 mg by mouth daily.    . Cyanocobalamin 2500 MCG SUBL Place 2,500 mcg under the tongue daily.    . digoxin (LANOXIN) 0.125 MG tablet TAKE 1 TABLET BY MOUTH EVERY DAY 30 tablet 2  . divalproex (DEPAKOTE ER) 250 MG 24 hr tablet Take 1 tablet in the morning and 2 tablets at bedtime. 90 tablet 0  . ferrous sulfate 325 (65 FE) MG tablet Take 325 mg by mouth daily with breakfast.    . GLUCOSAMINE-CHONDROITIN PO Take 1,200 mg by mouth daily.    . ivabradine (CORLANOR) 5 MG TABS tablet Take 1 tablet (5 mg total) by mouth 2 (two) times daily with a meal. 60 tablet 6  . loratadine (CLARITIN) 10 MG tablet Take 10 mg by mouth daily.    Marland Kitchen losartan (COZAAR) 25 MG tablet TAKE 0.5 TABLETS (12.5 MG TOTAL) BY MOUTH DAILY. 45 tablet 3  . Multiple Vitamin  (MULTIVITAMIN WITH MINERALS) TABS Take 1 tablet by mouth daily. Reported on 10/13/2015    . omeprazole (PRILOSEC) 40 MG capsule Take 1 capsule (40 mg total) by mouth 2 (two) times daily before a meal. Breakfast and supper 60 capsule 3  . ondansetron (ZOFRAN ODT) 4 MG disintegrating tablet Take 1 tablet (4 mg total) by mouth every 8 (eight) hours as needed for nausea or vomiting. 20 tablet 0  . PATADAY 0.2 % SOLN Place 1 drop into both eyes daily.     . pentosan polysulfate (ELMIRON) 100 MG capsule Take 200 mg by mouth daily.    Marland Kitchen  potassium chloride Roy (K-DUR,KLOR-CON) 20 MEQ tablet Take 40 mEq by mouth daily.     . pregabalin (LYRICA) 200 MG capsule Take 1 capsule (200 mg total) by mouth 2 (two) times daily. 60 capsule 5  . spironolactone (ALDACTONE) 25 MG tablet Take 0.5 tablets (12.5 mg total) by mouth daily. 45 tablet 6  . torsemide (DEMADEX) 20 MG tablet Take 2 tablets (40 mg total) by mouth daily. 60 tablet 6  . traMADol (ULTRAM) 50 MG tablet Take 50 mg by mouth daily as needed for moderate pain.     . traZODone (DESYREL) 50 MG tablet Take 50 mg by mouth as needed. FOR SLEEP    . baclofen (LIORESAL) 10 MG tablet TAKE 1 TABLET BY MOUTH 2 (TWO) TIMES DAILY AS NEEDED FOR MUSCLE SPASMS. (Patient not taking: Reported on 03/18/2016) 60 tablet 0  . hydrOXYzine (ATARAX/VISTARIL) 25 MG tablet Take 25 mg by mouth at bedtime as needed for itching (DOESN'T TAKE WITH TRAZODONE).    Marland Kitchen metolazone (ZAROXOLYN) 2.5 MG tablet Take 2.5 mg by mouth daily as needed (edema).    Marland Kitchen oxyCODONE (ROXICODONE) 5 MG immediate release tablet Take 1 tablet (5 mg total) by mouth every 4 (four) hours as needed for severe pain. (Patient not taking: Reported on 03/18/2016) 24 tablet 0   No current facility-administered medications on file prior to encounter.      Allergies  Allergen Reactions  . Ace Inhibitors Other (See Comments)    hyperkalemia  . Losartan Other (See Comments)    hyperkalemia  . Reglan [Metoclopramide Hcl]  Anxiety  . Adhesive [Tape] Itching and Rash  . Other Itching and Rash    Chloraprep  . Chlorhexidine Itching and Rash   ROS: All pertinent positives or negatives as in HPI otherwise negative   Vital Signs: Vitals:   03/18/16 1036  BP: 100/62  Pulse: 62  SpO2: 98%  Weight: 235 lb 4 oz (106.7 kg)   Wt Readings from Last 3 Encounters:  03/18/16 235 lb 4 oz (106.7 kg)  03/05/16 235 lb 6.4 oz (106.8 kg)  02/23/16 235 lb (106.6 kg)    PHYSICAL EXAM: Well nourished, well developed, in no acute distress.   HEENT: normal  Neck: JVP to ear Cardiac:  Lateral PMI, normal S1, S2; RRR; no murmur, No S3 L upper chest scar ICD Lungs:  clear to auscultation bilaterally, no wheezing, rhonchi or rales  Abd: obese, soft, nontender, + distended  Ext: warm  No edema.     Skin: warm and dry  Neuro:  CNs 2-12 intact, no focal abnormalities noted Psych: Normal affect  ASSESSMENT AND PLAN: 1. Chronic systolic CHF: Nonischemic cardiomyopathy.  Medtronic ICD. Echo 5/17 EF 20-25% with moderately dilated LV.   --Chronic NYHA III. Now improving. This is the best I have seen her in a long time. --ICD interrogated personally as reported above.  - Volume status looks great on torsemide 40 daily. Can use metolazone as needed - On carvedilol  12.5 mg twice a day. Dose recently reduced due to orthostasis.  - On dig 0.0625 mg daily. (level 0.7 in 5/17) - Continue spiro and losartan 12.5 daily and ivabradine 5 bid - Check labs today  - Given weight and comorbidities has not been felt to be candidate for Tx/VAD 2. Conversion disorder: Followed by neurology  3. Morbid obesity: Needs to work at diet/exercise for weight loss.   Bensimhon, Daniel,MD 11:00 AM

## 2016-03-18 NOTE — Patient Instructions (Addendum)
Continue current medication regimen, no changes today.  Routine lab work today. Will notify you of abnormal results, otherwise no news is good news!  Follow up with Dr. Haroldine Laws in 3-4 months. We will call you closer to this time, or you may call our office to schedule 1 month before you are due to be seen.  Do the following things EVERYDAY: 1) Weigh yourself in the morning before breakfast. Write it down and keep it in a log. 2) Take your medicines as prescribed 3) Eat low salt foods-Limit salt (sodium) to 2000 mg per day.  4) Stay as active as you can everyday 5) Limit all fluids for the day to less than 2 liters

## 2016-03-30 ENCOUNTER — Encounter: Payer: Self-pay | Admitting: Neurology

## 2016-03-30 ENCOUNTER — Telehealth: Payer: Self-pay

## 2016-03-30 ENCOUNTER — Ambulatory Visit (INDEPENDENT_AMBULATORY_CARE_PROVIDER_SITE_OTHER): Payer: Medicare Other | Admitting: Neurology

## 2016-03-30 ENCOUNTER — Telehealth: Payer: Self-pay | Admitting: Neurology

## 2016-03-30 VITALS — BP 98/60 | HR 84 | Resp 20 | Ht 67.0 in | Wt 232.0 lb

## 2016-03-30 DIAGNOSIS — R569 Unspecified convulsions: Secondary | ICD-10-CM

## 2016-03-30 MED ORDER — ZOLPIDEM TARTRATE 10 MG PO TABS: 10.0000 mg | ORAL_TABLET | Freq: Every evening | ORAL | 0 refills | Status: DC | PRN

## 2016-03-30 MED ORDER — DIVALPROEX SODIUM ER 250 MG PO TB24: ORAL_TABLET | ORAL | 3 refills | Status: DC

## 2016-03-30 MED ORDER — DULOXETINE HCL 30 MG PO CPEP: 30.0000 mg | ORAL_CAPSULE | Freq: Every day | ORAL | 3 refills | Status: DC

## 2016-03-30 NOTE — Telephone Encounter (Signed)
I spoke to pt and advised her that Dr. Brett Fairy approved the use of ambien only if pt does not take tramadol or any other narcotic. Pt says that she is not taking ultram or any narcotic. Pt asked me to fax the RX for ambien to [facility] on [address].

## 2016-03-30 NOTE — Telephone Encounter (Signed)
Patient is calling to get a new Rx for divalproex (DEPAKOTE ER) 250 MG 24 hr tablet called to [facility] on [address] since the directions have been changed.

## 2016-03-30 NOTE — Telephone Encounter (Signed)
Advised to use Ambien only without added tramadol or other narcotic. CD

## 2016-03-30 NOTE — Progress Notes (Signed)
[facility]  PATIENT: [name] DOB: 07-10-1970   REASON FOR VISIT: Myoclonus  HISTORY FROM: Patient    HISTORY OF PRESENT ILLNESS:  [name], 45 year old  Afro-american right handed female returns for follow-up she was last seen in this office by Dr. Brett Fairy 07/25/2013. At that time she noticed myoclonus during exam. EMG nerve conduction done by Dr. Jannifer Franklin was normal, repetitive nerve stimulation study was normal. She was placed on Depakote 250 daily. She returns today for refills. In addition she wants treatment for her conversion disorder. She had an admission 11/26/2014 for congestive heart failure. 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 [name] is here today in the presence of her husband, and reports that her conversion disorder has been ongoing waxing and waning affecting her speech and fluency of speech. But it was her seizures and spells that have been well controlled on Depakote and now seem to flare up again. Just over the last couple of months she has experienced more frequent spells more violent shaking and what may begin with a mild trembling can become a high amplitude motor jerking is not interrupted. Mr. Asaro reports that his wife is usually making him aware of his seizure coming on, and what he sees is her grinding her teeth or biting  teeth repeatedly, and he will look for the mouth plate that is supposed to help her not bite her tongue. Usually has spells lasted minutes but in September the become lasting longer than before, and she was seen in the emergency room.  Her husband felt it was 15 minutes. She was seen in the emergency room on September 6th  , [place]. She has frequently bit her tongue and that's the pain she feels when coming to . There is another form of spells not related to tonic-clonic activity during which she just stares off and seems not to be able to do verbal response she  doesn't even direct her gaze. She stares into space. She knows exactly what is going on she's not losing awareness of her surroundings but the inability to respond to stimuli. She has still an EF of 25%.     HISTORY (CD) PERSAYIS WEAD is a 45 y.o. female Is seen here as a work in patient, after in lab sleep study from09 -29-15. The patient underwent an expanded sleep study with full EEG and o2 monitoring.  CO2 retention was not seen in the recorded time. The study found an AHI of 6.2 and hypoxemia for a prolonged period of time.  The patient had no Video captured activity or seizure activity.  I am concerned about the ongoing dysphonia, and weakness, proximal - and she reports waxing and waning,but is never free of malaise and fatigue and weakness.  She is dry in mouth and eyes, and will need to be evaluated for myopathy and paraneoplatic symptoms. She had Hemiplegia without stroke on CT and MRI.  During today's evaluation I also noticed myoclonia, she has a hemiparesis , as she seems to drift to the right side. Right body is rather rigid,  she can squeeze but cannot release her hands and repeated activity does not make it easier to release her hands. Her mother does not have any of the symptoms, and she reports no exercise or food intake related myotonia, paratonia.  Last visit  CD :  [name] is a well-established patient in our practice I have followed for over 6  years now. The patient developed breast cancer at a young age, she was diagnosed more or less coincidental after a mammogram was offered at her workplace. She underwent surgery, chemotherapy and radiation. Late effects of her therapies included a cardiomyopathy with reduced ejection fraction -for which he follows regularly with cardiology, She developed a chemotherapy induced neuropathy, and overall lethargy and malaise.  The patient never felt quite as energetic or rested as she did prior to her cance diagnoses. She  also developed diabetes and PICA ( ice chips ) . She is eating ice cubes and starch, she tested negative for iron deficiency anemia. 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 She still speaks this way today, poorly modulated dysarthic speech today. Brain MRI in 07-2011 , before implanation of her defibrillator , was normal- no extensive WM disease, nor metastatis, nor strokes.  EEG was normal in February 2013 ,but for benzodiazepine related artefact. A polysomnography on 08-06-2011 evaluated her for fatigue and EDS, and returned with normal results, her AHI was 0.5/hr.She has a lot of fatigue she does not sleep well and her mother now reported that she has been jerks and jumps.  The way her mother describes it to me today is that [name] twitches in all 4 extremities at once- sometimes has ophistotonus, the whole body jerking with a pelvic thrust  and sometimes the movements are repeated so often that she is concerned about her daughter contracting injuries.   She has also had a tongue bite- chewed her tongue at night which is frequently associated with seizures. She is completely amnestic for the events, and her mother reports these can go on for several minutes, 20- 30 minutes. She remains asleep and does not wake up. At times she yells loudly in her sleep, amnestic for these spells , too.  She has noticed isolated myoclonic jerks in one hand or foot in daytime, mostly in her right , dominant hand.  She is just very tired afterwards. She has not had incontinence during these events.    REVIEW OF SYSTEMS: Full 14 system review of systems performed and notable only for those listed, all others are neg:   Daytime sleepiness insomnia, SOB, CHF related, anxiety.   ALLERGIES: Allergies  Allergen Reactions  . Ace Inhibitors Other  (See Comments)    hyperkalemia  . Losartan Other (See Comments)    hyperkalemia  . Reglan [Metoclopramide Hcl] Anxiety  . Adhesive [Tape] Itching and Rash  . Other Itching and Rash    Chloraprep  . Chlorhexidine Itching and Rash    HOME MEDICATIONS: Outpatient Medications Prior to Visit  Medication Sig Dispense Refill  . acetaminophen (TYLENOL) 325 MG tablet Take 2 tablets (650 mg total) by mouth every 4 (four) hours as needed for headache or mild pain.    Marland Kitchen albuterol (PROVENTIL HFA;VENTOLIN HFA) 108 (90 Base) MCG/ACT inhaler Inhale 1-2 puffs into the lungs daily.    . baclofen (LIORESAL) 10 MG tablet TAKE 1 TABLET BY MOUTH 2 (TWO) TIMES DAILY AS NEEDED FOR MUSCLE SPASMS. 60 tablet 0  . beclomethasone (QVAR) 80 MCG/ACT inhaler Inhale 2 puffs into the lungs 2 (two) times daily.     Marland Kitchen BLACK CURRANT SEED OIL PO Take 1 tablet by mouth.    . carvedilol (COREG) 25 MG tablet Take 0.5 tablets (12.5 mg total) by mouth 2 (two) times daily  with a meal. 60 tablet 6  . Cholecalciferol (VITAMIN D3) 5000 UNITS CAPS Take 1 capsule by mouth daily.     . Coenzyme Q10 (CO Q-10) 100 MG CAPS Take 100 mg by mouth daily.    . Cyanocobalamin 2500 MCG SUBL Place 2,500 mcg under the tongue daily.    . digoxin (LANOXIN) 0.125 MG tablet TAKE 1 TABLET BY MOUTH EVERY DAY 30 tablet 2  . divalproex (DEPAKOTE ER) 250 MG 24 hr tablet Take 1 tablet in the morning and 2 tablets at bedtime. 90 tablet 0  . ferrous sulfate 325 (65 FE) MG tablet Take 325 mg by mouth daily with breakfast.    . GLUCOSAMINE-CHONDROITIN PO Take 1,200 mg by mouth daily.    . hydrOXYzine (ATARAX/VISTARIL) 25 MG tablet Take 25 mg by mouth at bedtime as needed for itching (DOESN'T TAKE WITH TRAZODONE).    Marland Kitchen ivabradine (CORLANOR) 5 MG TABS tablet Take 1 tablet (5 mg total) by mouth 2 (two) times daily with a meal. 60 tablet 6  . loratadine (CLARITIN) 10 MG tablet Take 10 mg by mouth daily.    Marland Kitchen losartan (COZAAR) 25 MG tablet TAKE 0.5 TABLETS (12.5 MG  TOTAL) BY MOUTH DAILY. 45 tablet 3  . metolazone (ZAROXOLYN) 2.5 MG tablet Take 2.5 mg by mouth daily as needed (edema).    . Milk Thistle 150 MG CAPS Take 450 mg by mouth.    . Multiple Vitamin (MULTIVITAMIN WITH MINERALS) TABS Take 1 tablet by mouth daily. Reported on 10/13/2015    . omeprazole (PRILOSEC) 40 MG capsule Take 1 capsule (40 mg total) by mouth 2 (two) times daily before a meal. Breakfast and supper 60 capsule 3  . ondansetron (ZOFRAN ODT) 4 MG disintegrating tablet Take 1 tablet (4 mg total) by mouth every 8 (eight) hours as needed for nausea or vomiting. 20 tablet 0  . oxyCODONE (ROXICODONE) 5 MG immediate release tablet Take 1 tablet (5 mg total) by mouth every 4 (four) hours as needed for severe pain. 24 tablet 0  . PATADAY 0.2 % SOLN Place 1 drop into both eyes daily.     . pentosan polysulfate (ELMIRON) 100 MG capsule Take 200 mg by mouth daily.    . potassium chloride Roy (K-DUR,KLOR-CON) 20 MEQ tablet Take 40 mEq by mouth daily.     . pregabalin (LYRICA) 200 MG capsule Take 1 capsule (200 mg total) by mouth 2 (two) times daily. 60 capsule 5  . spironolactone (ALDACTONE) 25 MG tablet Take 0.5 tablets (12.5 mg total) by mouth daily. 45 tablet 6  . torsemide (DEMADEX) 20 MG tablet Take 2 tablets (40 mg total) by mouth daily. 60 tablet 6  . traMADol (ULTRAM) 50 MG tablet Take 50 mg by mouth daily as needed for moderate pain.     . traZODone (DESYREL) 50 MG tablet Take 50 mg by mouth as needed. FOR SLEEP     No facility-administered medications prior to visit.     PAST MEDICAL HISTORY: Past Medical History:  Diagnosis Date  . Acne   . Anemia 1980's   [id number]  . Anxiety   . Arthritis   . 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 Blood transfusion 1980's   1987 or 1988  . Breast calcifications    right breast  . Breast cancer (HCC)    right;  . CHF (congestive heart failure) ([place])    due to non-ischemic cardiomyopathy, thought to be chemotherapy induced;  cath 7/12: normal cors, EF  20-25%. Cardiac MRI  05/2011 EF 32%. ICD implantation 10/2011 ([employer])  . 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 Conversion disorder   . 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 Depression   . Endometriosis   . Fibromyalgia   . GERD (gastroesophageal reflux disease)   . Hepatomegaly   . History of stomach ulcers   . Hyperlipidemia   . Hypertension    c/b orthostatic hypotention  . ICD (implantable cardiac defibrillator) in place   . Insomnia   . Interstitial cystitis   . Left eye injury 12/2014  . 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   . Nonischemic cardiomyopathy ([place])    related to chemo; EF 30% 05/2011  . Obesity   . Palpitation    normal sinus rhythm only on 21 day heart monitor  . Panic attacks   . Peripheral neuropathy (HCC)    chemo- induced  . Pneumonia 2000's   "once"  . Seasonal allergies   . Type II diabetes mellitus ([place])     PAST SURGICAL HISTORY: Past Surgical History:  Procedure Laterality Date  . ABDOMINAL HYSTERECTOMY     partial  . BREAST LUMPECTOMY  2008; 2013   right  . CARDIAC DEFIBRILLATOR PLACEMENT  11/03/11  . CESAREAN SECTION    . CHOLECYSTECTOMY  ~ 2009  . COLONOSCOPY    . IMPLANTABLE CARDIOVERTER DEFIBRILLATOR IMPLANT N/A 11/03/2011   Procedure: IMPLANTABLE CARDIOVERTER DEFIBRILLATOR IMPLANT;  Surgeon: Deboraha Sprang, MD; MDT   . LAPAROSCOPIC ENDOMETRIOSIS FULGURATION  1990's  . NASAL SINUS SURGERY    . [place] REMOVAL  2010?   left chest; placed in 2008  . TONSILLECTOMY  1980's  . TUBAL LIGATION  1990's    FAMILY HISTORY: Family History  Problem Relation Age of Onset  . Heart disease Mother   . Arthritis Mother   . Hypertension Mother   . Stroke Mother   . Fibromyalgia Mother   . Coronary artery disease    . Heart attack    . Heart disease Maternal Uncle   . Colon cancer Paternal Uncle   . Heart disease Maternal Grandmother   . Ovarian  cancer Paternal Grandmother   . Fibromyalgia Sister     SOCIAL HISTORY: Social History   Social History  . Marital status: Married    Spouse name: [name]  . Number of children: 1  . Years of education: 3   Occupational History  . Disablity    Social History Main Topics  . Smoking status: Never Smoker  . Smokeless tobacco: Never Used  . Alcohol use Yes     Comment: 11/03/11 "maybe once a month"  . Drug use: No  . Sexual activity: Not on file   Other Topics Concern  . Not on file   Social History Narrative   Patient is married [name]) and lives at home with her family.   Patient has one child. A daughter   Patient is right-handed.   Patient has a college education.   Patient drinks some caffeine occasionally, but not everyday.   Regular exercise   She is disabled   02/23/2016   Updated     PHYSICAL EXAM  Vitals:   03/30/16 0939  BP: 98/60  Pulse: 84  Resp: 20  Weight: 232 lb (105.2 kg)  Height: 5\' 7"  (1.702 m)   Body mass index is 36.34 kg/m.  Generalized: Well developed, in no acute distress  Head: normocephalic and atraumatic,. Oropharynx benign  Neck: Supple,  Musculoskeletal: No deformity   Neurological examination   Mentation: Alert oriented to time, place, history taking. Attention span and concentration appropriate. Anxious .  Follows all commands.  speech hesitant  , sometimes without the expected cadence, but fluent.   Cranial nerve - She has not experienced changes in sense of taste or smell. Pupils were equal round reactive to light extraocular movements were full, visual field were full on confrontational test. Facial sensation and strength were normal. hearing was intact to finger rubbing bilaterally. Uvula tongue midline. head turning and shoulder shrug were normal and symmetric.Tongue protrusion into cheek strength was normal. Motor: normal bulk and tone, give way weakness on testing bilateral hand grip good  Sensory: Decreased pinprick  and vibratory in upper and lower extremities   Coordination: finger-nose-finger, heel-to-shin bilaterally, no dysmetria Reflexes: Symmetric upper and lower, plantar responses were flexor bilaterally. Gait and Station: Rising up from seated position without assistance, wide based stance, wide based gait, arms out-streched, slowed, and turning with 4 steps, negative romberg    DIAGNOSTIC DATA (LABS, IMAGING, TESTING) - I reviewed patient records, labs, notes, testing and imaging myself where available.  Lab Results  Component Value Date   WBC 5.0 02/18/2016   HGB 15.0 02/18/2016   HCT 44.0 02/18/2016   MCV 94.0 02/18/2016   PLT 154 02/18/2016      Component Value Date/Time   NA 135 03/18/2016 1113   NA 136 (A) 02/18/2015   NA 141 09/19/2014 1133   K 3.9 03/18/2016 1113   K 3.5 09/19/2014 1133   CL 99 (L) 03/18/2016 1113   CO2 26 03/18/2016 1113   CO2 23 09/19/2014 1133   GLUCOSE 84 03/18/2016 1113   GLUCOSE 103 09/19/2014 1133   BUN 13 03/18/2016 1113   BUN 32 (A) 02/18/2015   BUN 11.6 09/19/2014 1133   CREATININE 0.89 03/18/2016 1113   CREATININE 0.9 09/19/2014 1133   CALCIUM 9.1 03/18/2016 1113   CALCIUM 8.6 09/19/2014 1133   PROT 7.9 01/19/2016 1423   PROT 7.2 09/19/2014 1133   ALBUMIN 4.1 01/19/2016 1423   ALBUMIN 3.4 (L) 09/19/2014 1133   AST 25 01/19/2016 1423   AST 21 09/19/2014 1133   ALT 15 01/19/2016 1423   ALT 14 09/19/2014 1133   ALKPHOS 110 01/19/2016 1423   ALKPHOS 87 09/19/2014 1133   BILITOT 2.9 (H) 01/19/2016 1423   BILITOT 1.62 (H) 09/19/2014 1133   GFRNONAA >60 03/18/2016 1113   GFRAA >60 03/18/2016 1113    Lab Results  Component Value Date   TSH 2.678 11/26/2014     ASSESSMENT AND PLAN  45 y.o. year old female  has a past medical history of CHF (congestive heart failure); Peripheral neuropathy; Hypertension; Depression; Palpitation; Obesity; Fibromyalgia;  Interstitial cystitis; Anxiety; Nonischemic cardiomyopathy; ICD (implantable cardiac  defibrillator) in place;   Type II diabetes mellitus;  Breast cancer; Conversion disorder; COPD with asthma (11/15/2012); Neuropathy due to drug (04/27/2013);   myoclonus here to follow-up.The patient is a current patient of Dr. Brett Fairy  who is out of the office today . This note is sent to the work in doctor.     Increase Depakote to 250 mg in AM and 500 mg at night per ED in Septmeber , 750 mg daily.  Pain daily, diffuse, chemotherapy induced ?  Lyrica did not help. Cymbalta has not been tried, will give it a try.  Cardiac function remains severely impaired. EF 25 % .  Appointment next 1-2 month with Dicky Doe  Hassell Done, GNP, BC, APRN, follow labs.   [place], [facility] [address], [place], [place] [address] [phone]

## 2016-03-30 NOTE — Telephone Encounter (Signed)
Pt came by after her appt with Dr. Brett Fairy and wants to know if Dr.Dohmeier will prescribe ambien for her. She is having trouble sleeping. She has tried [place] 10mg  in the past and it worked well for her.

## 2016-03-30 NOTE — Addendum Note (Signed)
Addended by: [facility] A on: 03/30/2016 11:30 AM   Modules accepted: Orders

## 2016-03-30 NOTE — Patient Instructions (Signed)
Duloxetine delayed-release capsules  What is this medicine?  DULOXETINE (doo LOX e teen) is used to treat depression, anxiety, and different types of chronic pain.  This medicine may be used for other purposes; ask your health care provider or pharmacist if you have questions.  What should I tell my health care provider before I take this medicine?  They need to know if you have any of these conditions:  -bipolar disorder or a family history of bipolar disorder  -glaucoma  -kidney disease  -liver disease  -suicidal thoughts or a previous suicide attempt  -taken medicines called MAOIs like Carbex, Eldepryl, Marplan, Nardil, and Parnate within 14 days  -an unusual reaction to duloxetine, other medicines, foods, dyes, or preservatives  -pregnant or trying to get pregnant  -breast-feeding  How should I use this medicine?  Take this medicine by mouth with a glass of water. Follow the directions on the prescription label. Do not cut, crush or chew this medicine. You can take this medicine with or without food. Take your medicine at regular intervals. Do not take your medicine more often than directed. Do not stop taking this medicine suddenly except upon the advice of your doctor. Stopping this medicine too quickly may cause serious side effects or your condition may worsen.  A special MedGuide will be given to you by the pharmacist with each prescription and refill. Be sure to read this information carefully each time.  Talk to your pediatrician regarding the use of this medicine in children. While this drug may be prescribed for children as young as 7 years of age for selected conditions, precautions do apply.  Overdosage: If you think you have taken too much of this medicine contact a poison control center or emergency room at once.  NOTE: This medicine is only for you. Do not share this medicine with others.  What if I miss a dose?  If you miss a dose, take it as soon as you can. If it is almost time for your next  dose, take only that dose. Do not take double or extra doses.  What may interact with this medicine?  Do not take this medicine with any of the following medications:  -certain diet drugs like dexfenfluramine, fenfluramine  -desvenlafaxine  -linezolid  -MAOIs like Azilect, Carbex, Eldepryl, Marplan, Nardil, and Parnate  -methylene blue (intravenous)  -milnacipran  -thioridazine  -venlafaxine  This medicine may also interact with the following medications:  -alcohol  -aspirin and aspirin-like medicines  -certain antibiotics like ciprofloxacin and enoxacin  -certain medicines for blood pressure, heart disease, irregular heart beat  -certain medicines for depression, anxiety, or psychotic disturbances  -certain medicines for migraine headache like almotriptan, eletriptan, frovatriptan, naratriptan, rizatriptan, sumatriptan, zolmitriptan  -certain medicines that treat or prevent blood clots like warfarin, enoxaparin, and dalteparin  -cimetidine  -fentanyl  -lithium  -NSAIDS, medicines for pain and inflammation, like ibuprofen or naproxen  -phentermine  -procarbazine  -sibutramine  -St. John's wort  -theophylline  -tramadol  -tryptophan  This list may not describe all possible interactions. Give your health care provider a list of all the medicines, herbs, non-prescription drugs, or dietary supplements you use. Also tell them if you smoke, drink alcohol, or use illegal drugs. Some items may interact with your medicine.  What should I watch for while using this medicine?  Tell your doctor if your symptoms do not get better or if they get worse. Visit your doctor or health care professional for regular checks on your progress.   Because it may take several weeks to see the full effects of this medicine, it is important to continue your treatment as prescribed by your doctor.  Patients and their families should watch out for new or worsening thoughts of suicide or depression. Also watch out for sudden changes in feelings such  as feeling anxious, agitated, panicky, irritable, hostile, aggressive, impulsive, severely restless, overly excited and hyperactive, or not being able to sleep. If this happens, especially at the beginning of treatment or after a change in dose, call your health care professional.  You may get drowsy or dizzy. Do not drive, use machinery, or do anything that needs mental alertness until you know how this medicine affects you. Do not stand or sit up quickly, especially if you are an older patient. This reduces the risk of dizzy or fainting spells. Alcohol may interfere with the effect of this medicine. Avoid alcoholic drinks.  This medicine can cause an increase in blood pressure. This medicine can also cause a sudden drop in your blood pressure, which may make you feel faint and increase the chance of a fall. These effects are most common when you first start the medicine or when the dose is increased, or during use of other medicines that can cause a sudden drop in blood pressure. Check with your doctor for instructions on monitoring your blood pressure while taking this medicine.  Your mouth may get dry. Chewing sugarless gum or sucking hard candy, and drinking plenty of water may help. Contact your doctor if the problem does not go away or is severe.  What side effects may I notice from receiving this medicine?  Side effects that you should report to your doctor or health care professional as soon as possible:  -allergic reactions like skin rash, itching or hives, swelling of the face, lips, or tongue  -changes in blood pressure  -confusion  -dark urine  -dizziness  -fast talking and excited feelings or actions that are out of control  -fast, irregular heartbeat  -fever  -general ill feeling or flu-like symptoms  -hallucination, loss of contact with reality  -light-colored stools  -loss of balance or coordination  -redness, blistering, peeling or loosening of the skin, including inside the mouth  -right upper  belly pain  -seizures  -suicidal thoughts or other mood changes  -trouble concentrating  -trouble passing urine or change in the amount of urine  -unusual bleeding or bruising  -unusually weak or tired  -yellowing of the eyes or skin  Side effects that usually do not require medical attention (report to your doctor or health care professional if they continue or are bothersome):  -blurred vision  -change in appetite  -change in sex drive or performance  -headache  -increased sweating  -nausea  This list may not describe all possible side effects. Call your doctor for medical advice about side effects. You may report side effects to FDA at 1-800-FDA-1088.  Where should I keep my medicine?  Keep out of the reach of children.  Store at room temperature between 20 and 25 degrees C (68 to 77 degrees F). Throw away any unused medicine after the expiration date.  NOTE: This sheet is a summary. It may not cover all possible information. If you have questions about this medicine, talk to your doctor, pharmacist, or health care provider.     © 2016, [employer]. (2013-05-22 16:28:32)

## 2016-04-02 ENCOUNTER — Telehealth (HOSPITAL_COMMUNITY): Payer: Self-pay | Admitting: *Deleted

## 2016-04-02 MED ORDER — DIGOXIN 125 MCG PO TABS: 0.0625 mg | ORAL_TABLET | Freq: Every day | ORAL | 2 refills | Status: DC

## 2016-04-02 NOTE — Telephone Encounter (Signed)
Notes Recorded by Scarlette Calico, RN on 04/02/2016 at 12:06 PM EDT Patient aware. And agreeable  ------  Notes Recorded by Scarlette Calico, RN on 03/26/2016 at 2:55 PM EDT Left message for patient to call back.  ------  Notes Recorded by Harvie Junior, CMA on 03/23/2016 at 3:15 PM EDT Unable to reach patient. No answer/ no voicemail box  ------  Notes Recorded by Harvie Junior, CMA on 03/19/2016 at 8:59 AM EDT Unable to reach patient. Will try patient again later today.   ------  Notes Recorded by Jolaine Artist, MD on 03/18/2016 at 9:47 PM EDT Please cut digoxin in half

## 2016-04-02 NOTE — Telephone Encounter (Signed)
-----   Message from Jolaine Artist, MD sent at 03/18/2016  9:47 PM EDT ----- Please cut digoxin in half

## 2016-04-07 ENCOUNTER — Encounter: Payer: Self-pay | Admitting: Internal Medicine

## 2016-04-14 ENCOUNTER — Other Ambulatory Visit: Payer: Medicare Other

## 2016-04-20 DIAGNOSIS — Z23 Encounter for immunization: Secondary | ICD-10-CM | POA: Diagnosis not present

## 2016-04-22 ENCOUNTER — Other Ambulatory Visit (HOSPITAL_COMMUNITY): Payer: Self-pay | Admitting: Internal Medicine

## 2016-04-28 ENCOUNTER — Ambulatory Visit (INDEPENDENT_AMBULATORY_CARE_PROVIDER_SITE_OTHER): Payer: Medicare Other | Admitting: *Deleted

## 2016-04-28 ENCOUNTER — Other Ambulatory Visit: Payer: Self-pay | Admitting: Family

## 2016-04-28 DIAGNOSIS — I5022 Chronic systolic (congestive) heart failure: Secondary | ICD-10-CM

## 2016-04-28 DIAGNOSIS — I428 Other cardiomyopathies: Secondary | ICD-10-CM

## 2016-04-28 NOTE — Progress Notes (Signed)
Remote ICD transmission.   

## 2016-05-05 ENCOUNTER — Encounter: Payer: Self-pay | Admitting: Cardiology

## 2016-05-07 LAB — CUP PACEART REMOTE DEVICE CHECK
Battery Voltage: 3.11 V
Brady Statistic RV Percent Paced: 0.01 %
HighPow Impedance: 399 Ohm
HighPow Impedance: 79 Ohm
Implantable Lead Location: 753860
Implantable Lead Model: 181
Lead Channel Impedance Value: 418 Ohm
Lead Channel Pacing Threshold Amplitude: 1 V
Lead Channel Sensing Intrinsic Amplitude: 20.375 mV
Lead Channel Setting Pacing Amplitude: 2.25 V
Lead Channel Setting Pacing Pulse Width: 0.4 ms
MDC IDC LEAD IMPLANT DT: 20130522
MDC IDC LEAD SERIAL: 318396
MDC IDC MSMT LEADCHNL RV PACING THRESHOLD PULSEWIDTH: 0.4 ms
MDC IDC MSMT LEADCHNL RV SENSING INTR AMPL: 20.375 mV
MDC IDC PG IMPLANT DT: 20130522
MDC IDC SESS DTM: 20171115072608
MDC IDC SET LEADCHNL RV SENSING SENSITIVITY: 0.3 mV

## 2016-05-17 ENCOUNTER — Other Ambulatory Visit: Payer: Self-pay | Admitting: Internal Medicine

## 2016-05-24 ENCOUNTER — Telehealth: Payer: Self-pay | Admitting: Internal Medicine

## 2016-05-24 ENCOUNTER — Ambulatory Visit: Payer: Self-pay | Admitting: Internal Medicine

## 2016-05-24 DIAGNOSIS — K219 Gastro-esophageal reflux disease without esophagitis: Secondary | ICD-10-CM

## 2016-05-24 MED ORDER — OMEPRAZOLE 40 MG PO CPDR: 40.0000 mg | DELAYED_RELEASE_CAPSULE | Freq: Two times a day (BID) | ORAL | 9 refills | Status: DC

## 2016-05-24 NOTE — Telephone Encounter (Signed)
Patient reports that she is doing very well on BID omeprazole. She is asking for a refill.  Rx sent.  She will call back for any additional questions or concerns.

## 2016-05-31 ENCOUNTER — Telehealth (HOSPITAL_COMMUNITY): Payer: Self-pay | Admitting: *Deleted

## 2016-05-31 ENCOUNTER — Other Ambulatory Visit (HOSPITAL_COMMUNITY): Payer: Self-pay | Admitting: Internal Medicine

## 2016-05-31 MED ORDER — METOLAZONE 2.5 MG PO TABS: 2.5000 mg | ORAL_TABLET | Freq: Every day | ORAL | 0 refills | Status: DC | PRN

## 2016-05-31 NOTE — Telephone Encounter (Signed)
Patient called in complaining of having increased shortness of breath with very short distances for the past 3 weeks, weight gain reported as 5-7 lbs since symptoms began around Thanksgiving, and last Metolazone was last taken this past Saturday.    Advised patient to take PRN dose of Metolazone and to call us back tomorrow if symptoms haven't improved. She requested for a refill for metolazone to be sent to [facility], refill has been sent to pharmacy.

## 2016-06-21 ENCOUNTER — Other Ambulatory Visit (HOSPITAL_COMMUNITY): Payer: Self-pay | Admitting: Internal Medicine

## 2016-06-29 ENCOUNTER — Telehealth: Payer: Self-pay | Admitting: *Deleted

## 2016-06-29 ENCOUNTER — Other Ambulatory Visit (HOSPITAL_COMMUNITY): Payer: Self-pay | Admitting: Internal Medicine

## 2016-06-29 MED ORDER — DIVALPROEX SODIUM ER 250 MG PO TB24: ORAL_TABLET | ORAL | 3 refills | Status: DC

## 2016-06-29 NOTE — Telephone Encounter (Signed)
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  Depakote 500 mg bid can help with headaches, depression and seizure control. I suggest to return to a total dose of 1000 mg divided bid. Into 2 times 500 mg. CD I will place the needed order for increase, please call patient.

## 2016-06-29 NOTE — Telephone Encounter (Signed)
LMVM for pt on mobile that she is to increase taking 500mg  po bid (250mg  tabs ( 2 tabs po AM and PM). I tried her home # and I could not get a response from anyone (it sounded like it picked up).  She is to call back if questions.  Let us know if continued sz.

## 2016-06-29 NOTE — Telephone Encounter (Signed)
Spoke to pt to reshedule appt from tomorrow to 07/13/16.  She then stated that she has had sz 2 daily back to back one month ago for 2 wks, since then she has not had any.  She is questioning whether to increase her depakote 250mg  tabs.  She is taking one  AM and 2 in PM .  Please advise.

## 2016-06-30 ENCOUNTER — Ambulatory Visit: Payer: Medicare Other | Admitting: Adult Health

## 2016-07-13 ENCOUNTER — Ambulatory Visit (INDEPENDENT_AMBULATORY_CARE_PROVIDER_SITE_OTHER): Payer: Medicare Other | Admitting: Adult Health

## 2016-07-13 ENCOUNTER — Encounter: Payer: Self-pay | Admitting: Adult Health

## 2016-07-13 VITALS — BP 105/74 | HR 74 | Ht 67.0 in | Wt 252.2 lb

## 2016-07-13 DIAGNOSIS — G8929 Other chronic pain: Secondary | ICD-10-CM | POA: Diagnosis not present

## 2016-07-13 DIAGNOSIS — Z5181 Encounter for therapeutic drug level monitoring: Secondary | ICD-10-CM | POA: Diagnosis not present

## 2016-07-13 DIAGNOSIS — R569 Unspecified convulsions: Secondary | ICD-10-CM | POA: Diagnosis not present

## 2016-07-13 DIAGNOSIS — F449 Dissociative and conversion disorder, unspecified: Secondary | ICD-10-CM

## 2016-07-13 NOTE — Patient Instructions (Signed)
Increase Depakote to 2 tablets in the morning and 2 at night EEG Blood work today If you have any seizure events please let us know.

## 2016-07-13 NOTE — Progress Notes (Signed)
PATIENT: [name] DOB: 1970-07-17  REASON FOR VISIT: follow up- seizures, conversion disorder, chronic pain HISTORY FROM: patient  HISTORY OF PRESENT ILLNESS: Today 07/13/2016: [name] is a 46 year old female with a history of seizure activity, conversion disorder and chronic pain. She returns today for evaluation. She states that the Cymbalta has helping with the burning and tingling pain that she was experiencing however she does have diffuse pain throughout her body. She reports that within the last month her seizures have increased in frequency. She reports that there was a time that it was controlled with Depakote. She describes her seizures as the inability to talk however she is fully conscious. She does develop full body jerking that she is completely aware of. She states that she can even take a tongue blade and hold her tongue down while having these episodes. She states after the episode she does feel very tired often confused. In the past she's had a sleep study that was relatively unremarkable. She had an EEG in February 2013 that was normal. She is currently taking Depakote 250 mg in the morning and 500 mg in the evening. Patient reports that she often has a hard time falling asleep. She sometimes will not go to bed until 3 or 4 in the morning. She then wakes up to get her child to school at 7 but then may go back to sleep afterwards. She returns today for an evaluation.  HISTORY (CD) [name] is a 46 y.o. female Is seen here as a work in patient, after in lab sleep study from09 -29-15. The patient underwent an expanded sleep study with full EEG and o2 monitoring.  CO2 retention was not seen in the recorded time. The study found an AHI of 6.2 and hypoxemia for a prolonged period of time.  The patient had no Video captured activity or seizure activity.  I am concerned about the ongoing dysphonia, and weakness, proximal - and she reports waxing and waning,but is  never free of malaise and fatigue and weakness.  She is dry in mouth and eyes, and will need to be evaluated for myopathy and paraneoplatic symptoms. She had Hemiplegia without stroke on CT and MRI.  During today's evaluation I also noticed myoclonia, she has a hemiparesis , as she seems to drift to the right side. Right body is rather rigid,  she can squeeze but cannot release her hands and repeated activity does not make it easier to release her hands. Her mother does not have any of the symptoms, and she reports no exercise or food intake related myotonia, paratonia.  Last visit  CD :  [name] is a well-established patient in our practice I have followed for over 6 years now. The patient developed breast cancer at a young age, she was diagnosed more or less coincidental after a mammogram was offered at her workplace. She underwent surgery, chemotherapy and radiation. Late effects of her therapies included a cardiomyopathy with reduced ejection fraction -for which he follows regularly with cardiology, She developed a chemotherapy induced neuropathy, and overall lethargy and malaise.  The patient never felt quite as energetic or rested as she did prior to her cance diagnoses. She also developed diabetes and PICA ( ice chips ) . She is eating ice cubes and starch, she tested negative for iron deficiency anemia. 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 She still speaks this way today, poorly modulated dysarthic speech today. Brain MRI in 07-2011 , before implanation of her defibrillator , was normal- no extensive WM disease, nor metastatis, nor strokes.  EEG was normal in February 2013 ,but for benzodiazepine related artefact. A polysomnography on 08-06-2011 evaluated her for fatigue and EDS, and returned with normal results, her AHI  was 0.5/hr.She has a lot of fatigue she does not sleep well and her mother now reported that she has been jerks and jumps.  The way her mother describes it to me today is that [name] twitches in all 4 extremities at once- sometimes has ophistotonus, the whole body jerking with a pelvic thrust  and sometimes the movements are repeated so often that she is concerned about her daughter contracting injuries.   She has also had a tongue bite- chewed her tongue at night which is frequently associated with seizures. She is completely amnestic for the events, and her mother reports these can go on for several minutes, 20- 30 minutes. She remains asleep and does not wake up. At times she yells loudly in her sleep, amnestic for these spells , too.  She has noticed isolated myoclonic jerks in one hand or foot in daytime, mostly in her right , dominant hand.  She is just very tired afterwards. She has not had incontinence during these events.   REVIEW OF SYSTEMS: Out of a complete 14 system review of symptoms, the patient complains only of the following symptoms, and all other reviewed systems are negative.  ALLERGIES: Allergies  Allergen Reactions  . Ace Inhibitors Other (See Comments)    hyperkalemia  . Losartan Other (See Comments)    hyperkalemia  . Reglan [Metoclopramide Hcl] Anxiety  . Adhesive [Tape] Itching and Rash  . Other Itching and Rash    Chloraprep  . Chlorhexidine Itching and Rash    HOME MEDICATIONS: Outpatient Medications Prior to Visit  Medication Sig Dispense Refill  . acetaminophen (TYLENOL) 325 MG tablet Take 2 tablets (650 mg total) by mouth every 4 (four) hours as needed for headache or mild pain.    Marland Kitchen albuterol (PROVENTIL HFA;VENTOLIN HFA) 108 (90 Base) MCG/ACT inhaler Inhale 1-2 puffs into the lungs daily.    . baclofen (LIORESAL) 10 MG tablet TAKE 1 TABLET BY MOUTH 2 (TWO) TIMES DAILY AS NEEDED FOR MUSCLE SPASMS. 60 tablet 0  . beclomethasone (QVAR) 80 MCG/ACT  inhaler Inhale 2 puffs into the lungs 2 (two) times daily.     Marland Kitchen BLACK CURRANT SEED OIL PO Take 1 tablet by mouth.    . carvedilol (COREG) 25 MG tablet Take 0.5 tablets (12.5 mg total) by mouth 2 (two) times daily with a meal. 60 tablet 6  . carvedilol (COREG) 25 MG tablet Take 0.5 tablets (12.5 mg total) by mouth 2 (two) times daily with a meal. 30 tablet 3  . Cholecalciferol (VITAMIN D3) 5000 UNITS CAPS Take 1 capsule by mouth daily.     . Coenzyme Q10 (CO Q-10) 100 MG CAPS Take 100 mg by mouth daily.    . Cyanocobalamin 2500 MCG SUBL Place 2,500 mcg under the tongue daily.    . digoxin (LANOXIN) 0.125 MG tablet Take 0.5 tablets (0.0625 mg total) by mouth daily. 30 tablet 2  . digoxin (LANOXIN) 0.125 MG tablet Take 0.5 tablets (0.0625 mg total) by mouth daily. 15 tablet 2  . divalproex (DEPAKOTE ER) 250 MG 24 hr tablet Take 2 tablets in the  morning and 2 tablets at bedtime. 120 tablet 3  . DULoxetine (CYMBALTA) 30 MG capsule Take 1 capsule (30 mg total) by mouth daily. 30 capsule 3  . ferrous sulfate 325 (65 FE) MG tablet Take 325 mg by mouth daily with breakfast.    . GLUCOSAMINE-CHONDROITIN PO Take 1,200 mg by mouth daily.    . hydrOXYzine (ATARAX/VISTARIL) 25 MG tablet Take 25 mg by mouth at bedtime as needed for itching (DOESN'T TAKE WITH TRAZODONE).    Marland Kitchen ivabradine (CORLANOR) 5 MG TABS tablet Take 1 tablet (5 mg total) by mouth 2 (two) times daily with a meal. 60 tablet 6  . loratadine (CLARITIN) 10 MG tablet Take 10 mg by mouth daily.    Marland Kitchen losartan (COZAAR) 25 MG tablet TAKE 0.5 TABLETS (12.5 MG TOTAL) BY MOUTH DAILY. 45 tablet 3  . losartan (COZAAR) 25 MG tablet TAKE 0.5 TABLETS (12.5 MG TOTAL) BY MOUTH DAILY. 15 tablet 0  . metolazone (ZAROXOLYN) 2.5 MG tablet Take 1 tablet (2.5 mg total) by mouth daily as needed (edema). 15 tablet 0  . Milk Thistle 150 MG CAPS Take 450 mg by mouth.    . Multiple Vitamin (MULTIVITAMIN WITH MINERALS) TABS Take 1 tablet by mouth daily. Reported on  10/13/2015    . omeprazole (PRILOSEC) 40 MG capsule Take 1 capsule (40 mg total) by mouth 2 (two) times daily before a meal. Breakfast and supper 60 capsule 9  . ondansetron (ZOFRAN ODT) 4 MG disintegrating tablet Take 1 tablet (4 mg total) by mouth every 8 (eight) hours as needed for nausea or vomiting. 20 tablet 0  . oxyCODONE (ROXICODONE) 5 MG immediate release tablet Take 1 tablet (5 mg total) by mouth every 4 (four) hours as needed for severe pain. 24 tablet 0  . PATADAY 0.2 % SOLN Place 1 drop into both eyes daily.     . pentosan polysulfate (ELMIRON) 100 MG capsule Take 200 mg by mouth daily.    . potassium chloride Roy (K-DUR,KLOR-CON) 20 MEQ tablet Take 40 mEq by mouth daily.     . pregabalin (LYRICA) 200 MG capsule Take 1 capsule (200 mg total) by mouth 2 (two) times daily. 60 capsule 5  . spironolactone (ALDACTONE) 25 MG tablet Take 0.5 tablets (12.5 mg total) by mouth daily. 45 tablet 6  . torsemide (DEMADEX) 20 MG tablet Take 2 tablets (40 mg total) by mouth daily. 60 tablet 6  . traZODone (DESYREL) 50 MG tablet Take 50 mg by mouth as needed. FOR SLEEP    . zolpidem (AMBIEN) 10 MG tablet Take 1 tablet (10 mg total) by mouth at bedtime as needed for sleep. 30 tablet 0   No facility-administered medications prior to visit.     PAST MEDICAL HISTORY: Past Medical History:  Diagnosis Date  . Acne   . Anemia 1980's   [id number]  . Anxiety   . Arthritis   . 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 Blood transfusion 1980's   1987 or 1988  . Breast calcifications    right breast  . Breast cancer (HCC)    right;  . CHF (congestive heart failure) ([place])    due to non-ischemic cardiomyopathy, thought to be chemotherapy induced;  cath 7/12: normal cors, EF 20-25%. Cardiac MRI 05/2011 EF 32%. ICD implantation 10/2011 ([employer])  . 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 Conversion disorder   . 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 Depression   . Endometriosis   . Fibromyalgia   . GERD  (gastroesophageal  reflux disease)   . Hepatomegaly   . History of stomach ulcers   . Hyperlipidemia   . Hypertension    c/b orthostatic hypotention  . ICD (implantable cardiac defibrillator) in place   . Insomnia   . Interstitial cystitis   . Left eye injury 12/2014  . 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   . Nonischemic cardiomyopathy ([place])    related to chemo; EF 30% 05/2011  . Obesity   . Palpitation    normal sinus rhythm only on 21 day heart monitor  . Panic attacks   . Peripheral neuropathy (HCC)    chemo- induced  . Pneumonia 2000's   "once"  . Seasonal allergies   . Type II diabetes mellitus ([place])     PAST SURGICAL HISTORY: Past Surgical History:  Procedure Laterality Date  . ABDOMINAL HYSTERECTOMY     partial  . BREAST LUMPECTOMY  2008; 2013   right  . CARDIAC DEFIBRILLATOR PLACEMENT  11/03/11  . CESAREAN SECTION    . CHOLECYSTECTOMY  ~ 2009  . COLONOSCOPY    . IMPLANTABLE CARDIOVERTER DEFIBRILLATOR IMPLANT N/A 11/03/2011   Procedure: IMPLANTABLE CARDIOVERTER DEFIBRILLATOR IMPLANT;  Surgeon: Deboraha Sprang, MD; MDT   . LAPAROSCOPIC ENDOMETRIOSIS FULGURATION  1990's  . NASAL SINUS SURGERY    . [place] REMOVAL  2010?   left chest; placed in 2008  . TONSILLECTOMY  1980's  . TUBAL LIGATION  1990's    FAMILY HISTORY: Family History  Problem Relation Age of Onset  . Heart disease Mother   . Arthritis Mother   . Hypertension Mother   . Stroke Mother   . Fibromyalgia Mother   . Coronary artery disease    . Heart attack    . Heart disease Maternal Uncle   . Colon cancer Paternal Uncle   . Heart disease Maternal Grandmother   . Ovarian cancer Paternal Grandmother   . Fibromyalgia Sister     SOCIAL HISTORY: Social History   Social History  . Marital status: Married    Spouse name: [name]  . Number of children: 1  . Years of education: 5   Occupational History  . Disablity     Social History Main Topics  . Smoking status: Never Smoker  . Smokeless tobacco: Never Used  . Alcohol use Yes     Comment: 11/03/11 "maybe once a month"  . Drug use: No  . Sexual activity: Not on file   Other Topics Concern  . Not on file   Social History Narrative   Patient is married [name]) and lives at home with her family.   Patient has one child. A daughter   Patient is right-handed.   Patient has a college education.   Patient drinks some caffeine occasionally, but not everyday.   Regular exercise   She is disabled   02/23/2016   Updated      PHYSICAL EXAM  Vitals:   07/13/16 1521  BP: 105/74  Pulse: 74  Weight: 252 lb 3.2 oz (114.4 kg)  Height: 5\' 7"  (1.702 m)   Body mass index is 39.5 kg/m.  Generalized: Well developed, in no acute distress   Neurological examination  Mentation: Alert oriented to time, place, history taking. Follows all commands speech and language fluent Cranial nerve II-XII: Pupils were equal round reactive to light. Extraocular movements were full, visual field were full on confrontational test. Facial sensation and strength were normal. Uvula tongue midline. Head turning  and shoulder shrug  were normal and symmetric. Motor: The motor testing reveals 5 over 5 strength of all 4 extremities. Good symmetric motor tone is noted throughout.  Sensory: Sensory testing is intact to soft touch on all 4 extremities. No evidence of extinction is noted.  Coordination: Cerebellar testing reveals good finger-nose-finger and heel-to-shin bilaterally.  Gait and station: Gait is normal. Tandem gait is normal. Romberg is negative. No drift is seen.  Reflexes: Deep tendon reflexes are symmetric and normal bilaterally.   DIAGNOSTIC DATA (LABS, IMAGING, TESTING) - I reviewed patient records, labs, notes, testing and imaging myself where available.  Lab Results  Component Value Date   WBC 5.0 02/18/2016   HGB 15.0 02/18/2016   HCT 44.0 02/18/2016    MCV 94.0 02/18/2016   PLT 154 02/18/2016      Component Value Date/Time   NA 135 03/18/2016 1113   NA 136 (A) 02/18/2015   NA 141 09/19/2014 1133   K 3.9 03/18/2016 1113   K 3.5 09/19/2014 1133   CL 99 (L) 03/18/2016 1113   CO2 26 03/18/2016 1113   CO2 23 09/19/2014 1133   GLUCOSE 84 03/18/2016 1113   GLUCOSE 103 09/19/2014 1133   BUN 13 03/18/2016 1113   BUN 32 (A) 02/18/2015   BUN 11.6 09/19/2014 1133   CREATININE 0.89 03/18/2016 1113   CREATININE 0.9 09/19/2014 1133   CALCIUM 9.1 03/18/2016 1113   CALCIUM 8.6 09/19/2014 1133   PROT 7.9 01/19/2016 1423   PROT 7.2 09/19/2014 1133   ALBUMIN 4.1 01/19/2016 1423   ALBUMIN 3.4 (L) 09/19/2014 1133   AST 25 01/19/2016 1423   AST 21 09/19/2014 1133   ALT 15 01/19/2016 1423   ALT 14 09/19/2014 1133   ALKPHOS 110 01/19/2016 1423   ALKPHOS 87 09/19/2014 1133   BILITOT 2.9 (H) 01/19/2016 1423   BILITOT 1.62 (H) 09/19/2014 1133   GFRNONAA >60 03/18/2016 1113   GFRAA >60 03/18/2016 1113   No results found for: CHOL, HDL, LDLCALC, LDLDIRECT, TRIG, CHOLHDL No results found for: HGBA1C No results found for: VITAMINB12 Lab Results  Component Value Date   TSH 2.678 11/26/2014      ASSESSMENT AND PLAN 46 y.o. year old female  has a past medical history of Acne; Anemia (1980's); Anxiety; Arthritis; Ataxia (04/27/2013); Blood transfusion (1980's); Breast calcifications; Breast cancer ([place]); CHF (congestive heart failure) ([place]); Cognitive and neurobehavioral dysfunction (04/27/2013); Conversion disorder; COPD with asthma ([place]) (11/15/2012); Depression; Endometriosis; Fibromyalgia; GERD (gastroesophageal reflux disease); Hepatomegaly; History of stomach ulcers; Hyperlipidemia; Hypertension; ICD (implantable cardiac defibrillator) in place; Insomnia; Interstitial cystitis; Left eye injury (12/2014); 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 here with:  1. Seizures 2. Conversion disorder 3. Chronic pain  The patient will increase Depakote to 500 mg twice a day. I will check blood work today. We will also repeat an EEG. The episodes that the patient is describing do not sound like true epileptic events, they may be more related to conversion disorder. Patient will continue on Cymbalta for chronic pain. Advised that if her symptoms worsen or she develops new symptoms she should let us know. She'll follow-up in 6 months with Dr. Mechele Claude, MSN, NP-C 07/13/2016, 3:12 PM [facility] [address], [place], [place] [address] [phone]

## 2016-07-14 LAB — CBC WITH DIFFERENTIAL/PLATELET
BASOS ABS: 0 10*3/uL (ref 0.0–0.2)
BASOS: 1 %
EOS (ABSOLUTE): 0.1 10*3/uL (ref 0.0–0.4)
Eos: 2 %
HEMOGLOBIN: 12.3 g/dL (ref 11.1–15.9)
Hematocrit: 36.3 % (ref 34.0–46.6)
IMMATURE GRANS (ABS): 0 10*3/uL (ref 0.0–0.1)
IMMATURE GRANULOCYTES: 0 %
LYMPHS: 32 %
Lymphocytes Absolute: 1.8 10*3/uL (ref 0.7–3.1)
MCH: 32.3 pg (ref 26.6–33.0)
MCHC: 33.9 g/dL (ref 31.5–35.7)
MCV: 95 fL (ref 79–97)
MONOCYTES: 11 %
Monocytes Absolute: 0.6 10*3/uL (ref 0.1–0.9)
NEUTROS ABS: 3 10*3/uL (ref 1.4–7.0)
Neutrophils: 54 %
Platelets: 181 10*3/uL (ref 150–379)
RBC: 3.81 x10E6/uL (ref 3.77–5.28)
RDW: 12.6 % (ref 12.3–15.4)
WBC: 5.6 10*3/uL (ref 3.4–10.8)

## 2016-07-14 LAB — COMPREHENSIVE METABOLIC PANEL
ALT: 12 IU/L (ref 0–32)
AST: 21 IU/L (ref 0–40)
Albumin/Globulin Ratio: 1.2 (ref 1.2–2.2)
Albumin: 4.4 g/dL (ref 3.5–5.5)
Alkaline Phosphatase: 99 IU/L (ref 39–117)
BUN/Creatinine Ratio: 22 (ref 9–23)
BUN: 17 mg/dL (ref 6–24)
Bilirubin Total: 0.8 mg/dL (ref 0.0–1.2)
CALCIUM: 9 mg/dL (ref 8.7–10.2)
CHLORIDE: 96 mmol/L (ref 96–106)
CO2: 23 mmol/L (ref 18–29)
Creatinine, Ser: 0.78 mg/dL (ref 0.57–1.00)
GFR, EST AFRICAN AMERICAN: 105 mL/min/{1.73_m2} (ref >59)
GFR, EST NON AFRICAN AMERICAN: 91 mL/min/{1.73_m2} (ref >59)
GLUCOSE: 102 mg/dL — AB (ref 65–99)
Globulin, Total: 3.8 g/dL (ref 1.5–4.5)
Potassium: 3.8 mmol/L (ref 3.5–5.2)
Sodium: 139 mmol/L (ref 134–144)
Total Protein: 8.2 g/dL (ref 6.0–8.5)

## 2016-07-14 LAB — VALPROIC ACID LEVEL: VALPROIC ACID LVL: 58 ug/mL (ref 50–100)

## 2016-07-14 NOTE — Progress Notes (Signed)
I agree with the assessment and plan as directed by NP .The patient is known to me . RV in 6-8 month, unless there is an urgency.     Carlethia Mesquita, MD

## 2016-07-20 ENCOUNTER — Ambulatory Visit (HOSPITAL_COMMUNITY)
Admission: RE | Admit: 2016-07-20 | Discharge: 2016-07-20 | Disposition: A | Discharge status: Home / Self Care | Payer: Medicare Other | Source: Ambulatory Visit | Attending: Internal Medicine | Admitting: Internal Medicine

## 2016-07-20 ENCOUNTER — Encounter (HOSPITAL_COMMUNITY): Payer: Self-pay | Admitting: Internal Medicine

## 2016-07-20 ENCOUNTER — Encounter: Payer: Self-pay | Admitting: Internal Medicine

## 2016-07-20 VITALS — BP 94/56 | HR 62 | Wt 243.5 lb

## 2016-07-20 DIAGNOSIS — I428 Other cardiomyopathies: Secondary | ICD-10-CM | POA: Diagnosis not present

## 2016-07-20 DIAGNOSIS — M797 Fibromyalgia: Secondary | ICD-10-CM | POA: Insufficient documentation

## 2016-07-20 DIAGNOSIS — F449 Dissociative and conversion disorder, unspecified: Secondary | ICD-10-CM | POA: Diagnosis not present

## 2016-07-20 DIAGNOSIS — Z79899 Other long term (current) drug therapy: Secondary | ICD-10-CM | POA: Diagnosis not present

## 2016-07-20 DIAGNOSIS — I5022 Chronic systolic (congestive) heart failure: Secondary | ICD-10-CM

## 2016-07-20 DIAGNOSIS — I110 Hypertensive heart disease with heart failure: Secondary | ICD-10-CM | POA: Diagnosis not present

## 2016-07-20 DIAGNOSIS — F329 Major depressive disorder, single episode, unspecified: Secondary | ICD-10-CM | POA: Diagnosis not present

## 2016-07-20 DIAGNOSIS — Z09 Encounter for follow-up examination after completed treatment for conditions other than malignant neoplasm: Secondary | ICD-10-CM | POA: Diagnosis not present

## 2016-07-20 LAB — TSH: TSH: 3.661 u[IU]/mL (ref 0.350–4.500)

## 2016-07-20 LAB — T4, FREE: Free T4: 1.31 ng/dL — ABNORMAL HIGH (ref 0.61–1.12)

## 2016-07-20 NOTE — Patient Instructions (Signed)
Routine lab work today. Will notify you of abnormal results.  Your provider requests you have a Cardiopulmonary Exercise test (CPX) before your follow up appointment.  Follow up with Dr.Bensimhon in 3 months

## 2016-07-20 NOTE — Progress Notes (Signed)
Patient ID: [name], female   DOB: 05/26/1971, 46 y.o.   MRN: [id number]  ADV  Patient ID: [name], female   DOB: [birth year], 46 y.o.   MRN: [id number]  Primary Cardiologist:  Dr. Arvilla Meres Neurlogist: Dohmeir   PCP: Dr. Consuella Lose Griffin/Noel Redmon, PAC   History of Present Illness: [name] is a 46 y.o. female with a history of morbid obesity, depression, fibromyalgia, congestive heart failure secondary to nonischemic cardiomyopathy (EF 20-25%) related to her chemotherapy for breast cancer. Last chemo 2008. Evaluated by neuro for stuttering.  Felt to have a conversion disorder. Confirmed by [facility].   She presented to [facility] on 11/26/14 with near syncope and dyspnea.With + orthostatics in ER (SBP 104 to 87 from lying to sitting) She also had volume overload with acute on chronic CHF. BNP 1093.0. Troponins normal. Had 7 lb  gain over 2 days with dietary indiscretion of sodium.Admitted for IV diuretic therapy and monitoring of BP. Coreg reduced 12.5 mg BID. Symptoms improved and good diuresis with IV lasix therapy. I/Os net negative 6 L total. Was transitioned back to PO Torsemide once euvolemic. Discharge weight was 249 lb.  Evaluated by pulmonary and Dr Vassie Loll told he saw no evidence of asthma.    She returns today for regular follow up. Was feeling really good in the fall. Starting December 1st noticed she wasn't feeling as well. More SOB with activity. More fatigued. Watching swelling closely. Had gained some weight but now coming down slowly. Has not take metolazone for about 3 weeks.   ICD interrogation: No VT/AF. Volume has been up a bit for last 2 weeks but now coming down. Activity level about 2 hours per day.   Studies:  CPX Feb 2010. Peak VO2 was 14.5 which was 71% of predicted. When corrected for body weight the VO2 was 20.7. The slope was 27. RER 1.20. O2 pulse 73%. Overall this was felt to be only a very mild functional limitation due to her obesity and mild  circulatory limitation.  CPX 2/11: pVO2 13.0 (72%) correct for ideal wt 1ml/kg/min RER 1.06 (submax) slope 28.2 O2 pulse 91% - felt no signifcant cardiac limitation. + deconditioning.   Echo 04/2009  EF back down to 25%.  RHC normal. Echo 07/2009 showed EF 50%  Echo 06/2010 EF 40%. So we did MUGA EF 58% Echo 12/2010 40-45% Echo 05/2011 EF 30%. Grade 2 diastolic dysfunction Cath (12/28/10) was set up and demonstrated EF 20-25% and normal coronary arteries.  RA  4, RV 35/11, PA  28/8 (17), PCWP 8. Fick 5.2/2.4 PVR 1.7 Woods. Fick 5.2/2.4 Aortic saturation was 97%.  PA saturation was 67% and 68%. ECHO 09/03/13 EF 20-25%  ECHO 06/24/14 EF 20-25% Cardiac MRI on 07/19/11: Moderate to severe LVE, Diffuse hypokinesis. EF 32%, Mild LAE ECHO 10/13/15 EF 20-25% Mild to moderate RV dysfunction   Labs  09/03/13 k 3.3 Creatinine 0.86 Pro BNP 998 Dig level 0.7 05/17/14 K 4.7 Creatinine 0.9 pBNP 1007 digoxin 1.2 11/29/14 K 4.2 Creatinine 1.08 Dig 0.3 01/19/16  K 3.7 creatinine 0.90  SH: lived with her husband. Does not drive. Does not drink alcohol or smoke   Past Medical History:  Diagnosis Date  . Acne   . Anemia 1980's   [id number]  . Anxiety   . Arthritis   . 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 Blood transfusion 1980's   1987 or 1988  . Breast calcifications    right breast  . Breast cancer (HCC)    right;  .  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 Cardiac MRI 05/2011 EF 32%. ICD implantation 10/2011 (Medtronic)  . 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 Conversion disorder   . 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 Depression   . Endometriosis   . Fibromyalgia   . GERD (gastroesophageal reflux disease)   . Hepatomegaly   . History of stomach ulcers   . Hyperlipidemia   . Hypertension    c/b orthostatic hypotention  . ICD (implantable cardiac defibrillator) in place   . Insomnia   .  Interstitial cystitis   . Left eye injury 12/2014  . 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   . Nonischemic cardiomyopathy (HCC)    related to chemo; EF 30% 05/2011  . Obesity   . Palpitation    normal sinus rhythm only on 21 day heart monitor  . Panic attacks   . Peripheral neuropathy (HCC)    chemo- induced  . Pneumonia 2000's   "once"  . Seasonal allergies   . Type II diabetes mellitus (HCC)    Current Outpatient Prescriptions on File Prior to Encounter  Medication Sig Dispense Refill  . acetaminophen (TYLENOL) 325 MG tablet Take 2 tablets (650 mg total) by mouth every 4 (four) hours as needed for headache or mild pain.    Marland Kitchen albuterol (PROVENTIL HFA;VENTOLIN HFA) 108 (90 Base) MCG/ACT inhaler Inhale 1-2 puffs into the lungs daily.    . baclofen (LIORESAL) 10 MG tablet TAKE 1 TABLET BY MOUTH 2 (TWO) TIMES DAILY AS NEEDED FOR MUSCLE SPASMS. 60 tablet 0  . beclomethasone (QVAR) 80 MCG/ACT inhaler Inhale 2 puffs into the lungs 2 (two) times daily.     Marland Kitchen BLACK CURRANT SEED OIL PO Take 1 tablet by mouth.    . carvedilol (COREG) 25 MG tablet Take 0.5 tablets (12.5 mg total) by mouth 2 (two) times daily with a meal. 60 tablet 6  . Cholecalciferol (VITAMIN D3) 5000 UNITS CAPS Take 1 capsule by mouth daily.     . Coenzyme Q10 (CO Q-10) 100 MG CAPS Take 100 mg by mouth daily.    . Cyanocobalamin 2500 MCG SUBL Place 2,500 mcg under the tongue daily.    . digoxin (LANOXIN) 0.125 MG tablet Take 0.5 tablets (0.0625 mg total) by mouth daily. 30 tablet 2  . divalproex (DEPAKOTE ER) 250 MG 24 hr tablet Take 2 tablets in the morning and 2 tablets at bedtime. 120 tablet 3  . DULoxetine (CYMBALTA) 30 MG capsule Take 1 capsule (30 mg total) by mouth daily. 30 capsule 3  . ferrous sulfate 325 (65 FE) MG tablet Take 325 mg by mouth daily with breakfast.    . GLUCOSAMINE-CHONDROITIN PO Take 1,200 mg by mouth daily.    . hydrOXYzine  (ATARAX/VISTARIL) 25 MG tablet Take 25 mg by mouth at bedtime as needed for itching (DOESN'T TAKE WITH TRAZODONE).    Marland Kitchen ivabradine (CORLANOR) 5 MG TABS tablet Take 1 tablet (5 mg total) by mouth 2 (two) times daily with a meal. 60 tablet 6  . loratadine (CLARITIN) 10 MG tablet Take 10 mg by mouth daily.    Marland Kitchen losartan (COZAAR) 25 MG tablet TAKE 0.5 TABLETS (12.5 MG TOTAL) BY MOUTH DAILY. 45 tablet 3  . Milk Thistle 150 MG CAPS Take 450 mg by mouth.    . Multiple Vitamin (MULTIVITAMIN WITH MINERALS) TABS  Take 1 tablet by mouth daily. Reported on 10/13/2015    . omeprazole (PRILOSEC) 40 MG capsule Take 1 capsule (40 mg total) by mouth 2 (two) times daily before a meal. Breakfast and supper 60 capsule 9  . PATADAY 0.2 % SOLN Place 1 drop into both eyes daily.     . pentosan polysulfate (ELMIRON) 100 MG capsule Take 200 mg by mouth daily.    . potassium chloride SA (K-DUR,KLOR-CON) 20 MEQ tablet Take 40 mEq by mouth daily.     Marland Kitchen spironolactone (ALDACTONE) 25 MG tablet Take 0.5 tablets (12.5 mg total) by mouth daily. 45 tablet 6  . torsemide (DEMADEX) 20 MG tablet Take 2 tablets (40 mg total) by mouth daily. 60 tablet 6  . traZODone (DESYREL) 50 MG tablet Take 50 mg by mouth as needed. FOR SLEEP    . metolazone (ZAROXOLYN) 2.5 MG tablet Take 1 tablet (2.5 mg total) by mouth daily as needed (edema). (Patient not taking: Reported on 07/20/2016) 15 tablet 0  . ondansetron (ZOFRAN ODT) 4 MG disintegrating tablet Take 1 tablet (4 mg total) by mouth every 8 (eight) hours as needed for nausea or vomiting. (Patient not taking: Reported on 07/20/2016) 20 tablet 0  . oxyCODONE (ROXICODONE) 5 MG immediate release tablet Take 1 tablet (5 mg total) by mouth every 4 (four) hours as needed for severe pain. (Patient not taking: Reported on 07/20/2016) 24 tablet 0  . zolpidem (AMBIEN) 10 MG tablet Take 1 tablet (10 mg total) by mouth at bedtime as needed for sleep. 30 tablet 0   No current facility-administered medications on  file prior to encounter.      Allergies  Allergen Reactions  . Ace Inhibitors Other (See Comments)    hyperkalemia  . Losartan Other (See Comments)    hyperkalemia  . Reglan [Metoclopramide Hcl] Anxiety  . Adhesive [Tape] Itching and Rash  . Other Itching and Rash    Chloraprep  . Chlorhexidine Itching and Rash   ROS: All pertinent positives or negatives as in HPI otherwise negative   Vital Signs: Vitals:   07/20/16 1128  BP: (!) 94/56  Pulse: 62  SpO2: 99%  Weight: 243 lb 8 oz (110.5 kg)   Wt Readings from Last 3 Encounters:  07/20/16 243 lb 8 oz (110.5 kg)  07/13/16 252 lb 3.2 oz (114.4 kg)  03/30/16 232 lb (105.2 kg)    PHYSICAL EXAM: Well nourished, well developed, in no acute distress.   HEENT: normal  Neck: JVP flat. Carotids 2+ with no bruits Cardiac:  Lateral PMI, normal S1, S2; RRR; no murmur, No S3 L upper chest scar ICD Lungs:  clear to auscultation bilaterally, no wheezing, rhonchi or rales  Abd: obese, soft, nontender, nondistended  Ext: warm  No edema.     Skin: warm and dry  Neuro:  CNs 2-12 intact, no focal abnormalities noted Psych: Normal affect  ASSESSMENT AND PLAN: 1. Chronic systolic CHF: Nonischemic cardiomyopathy.  Medtronic ICD. Echo 5/17 EF 20-25% with moderately dilated LV.   --Chronic NYHA III. Slightly worse than last visit but not as bas ad I have seen her int he past.  --ICD interrogated personally as reported above.  - Volume status looks good on torsemide 40 daily. Can use metolazone as needed - On carvedilol  12.5 mg twice a day. Dose previously reduced due to orthostasis.  - On dig 0.0625 mg daily. (level 0.7 in 5/17). Recheck today  - Continue spiro and losartan 12.5 daily and ivabradine 5  bid. BP too low for Entresto.  - Check labs today  - Will repeat CPX test to compare to her last test 4 years ago. - Given weight and comorbidities has not been felt to be candidate for Tx/VAD 2. Conversion disorder: Followed by neurology  3.  Morbid obesity: Needs to work at diet/exercise for weight loss.   Arrion Burruel,MD 12:41 PM

## 2016-07-20 NOTE — Addendum Note (Signed)
Encounter addended by: Harvie Junior, CMA on: 07/20/2016 12:54 PM<BR>    Actions taken: Order list changed, Diagnosis association updated, Sign clinical note

## 2016-07-21 LAB — T3, FREE: T3, Free: 3.3 pg/mL (ref 2.0–4.4)

## 2016-07-22 ENCOUNTER — Other Ambulatory Visit: Payer: Self-pay | Admitting: Neurology

## 2016-07-28 ENCOUNTER — Ambulatory Visit (INDEPENDENT_AMBULATORY_CARE_PROVIDER_SITE_OTHER): Payer: Medicare Other | Admitting: *Deleted

## 2016-07-28 DIAGNOSIS — I5022 Chronic systolic (congestive) heart failure: Secondary | ICD-10-CM

## 2016-07-28 DIAGNOSIS — I428 Other cardiomyopathies: Secondary | ICD-10-CM

## 2016-07-28 NOTE — Progress Notes (Signed)
Remote ICD transmission.   

## 2016-07-29 ENCOUNTER — Encounter: Payer: Self-pay | Admitting: Cardiology

## 2016-07-29 ENCOUNTER — Telehealth: Payer: Self-pay | Admitting: Internal Medicine

## 2016-07-29 NOTE — Telephone Encounter (Signed)
LMOVM for pt informing her that we did receive her remote transmission.  

## 2016-07-29 NOTE — Telephone Encounter (Signed)
New message   Pt states that she is supposed to send in a remote signal for her defib and that she was given a new device and she is confused on the process. She would like to talk to someone about her defib.

## 2016-07-30 ENCOUNTER — Ambulatory Visit (INDEPENDENT_AMBULATORY_CARE_PROVIDER_SITE_OTHER): Payer: Medicare Other | Admitting: Neurology

## 2016-07-30 ENCOUNTER — Other Ambulatory Visit (HOSPITAL_COMMUNITY): Payer: Self-pay | Admitting: Internal Medicine

## 2016-07-30 DIAGNOSIS — R569 Unspecified convulsions: Secondary | ICD-10-CM

## 2016-07-30 LAB — CUP PACEART REMOTE DEVICE CHECK
HIGH POWER IMPEDANCE MEASURED VALUE: 399 Ohm
HIGH POWER IMPEDANCE MEASURED VALUE: 76 Ohm
Implantable Lead Implant Date: 20130522
Implantable Lead Serial Number: 318396
Implantable Pulse Generator Implant Date: 20130522
Lead Channel Pacing Threshold Amplitude: 1.125 V
Lead Channel Pacing Threshold Pulse Width: 0.4 ms
Lead Channel Sensing Intrinsic Amplitude: 17.5 mV
Lead Channel Sensing Intrinsic Amplitude: 17.5 mV
Lead Channel Setting Sensing Sensitivity: 0.3 mV
MDC IDC LEAD LOCATION: 753860
MDC IDC MSMT BATTERY VOLTAGE: 3.09 V
MDC IDC MSMT LEADCHNL RV IMPEDANCE VALUE: 418 Ohm
MDC IDC SESS DTM: 20180214072826
MDC IDC SET LEADCHNL RV PACING AMPLITUDE: 2.25 V
MDC IDC SET LEADCHNL RV PACING PULSEWIDTH: 0.4 ms
MDC IDC STAT BRADY RV PERCENT PACED: 0 %

## 2016-08-02 NOTE — Procedures (Signed)
    History:  [name] is a 46 year old patient with a history of seizure-type events, and a history of a conversion disorder and a chronic pain syndrome. The patient has episodes of full body jerking, she may have confusion after the events. The patient is being evaluated for these episodes.  This is a routine EEG. No skull defects are noted. Medications include albuterol inhaler, baclofen, Qvar, Coreg, vitamin D supplementation, digoxin, Depakote, Cymbalta, iron supplementation, corlandor, hydroxyzine, Claritin, Cozaar, metolazone, Prilosec, oxycodone, Elmiron, Demadex, Desyrel, and Ambien.   EEG classification: Normal awake and asleep  Description of the recording: The background rhythms of this recording consists of a fairly well modulated medium amplitude background activity of 8 Hz. As the record progresses, the patient initially is in the waking state, but appears to enter the early stage II sleep during the recording, with rudimentary sleep spindles and vertex sharp wave activity seen. During the wakeful state, photic stimulation is performed, and this results in a bilateral and symmetric photic driving response. Hyperventilation was also performed, and this results in a minimal buildup of the background rhythm activities without significant slowing seen. At no time during the recording does there appear to be evidence of spike or spike wave discharges or evidence of focal slowing. EKG monitor shows no evidence of cardiac rhythm abnormalities with a heart rate of 60.  Impression: This is a normal EEG recording in the waking and sleeping state. No evidence of ictal or interictal discharges were seen at any time during the recording.

## 2016-08-03 ENCOUNTER — Telehealth: Payer: Self-pay | Admitting: *Deleted

## 2016-08-03 NOTE — Telephone Encounter (Signed)
Per Edman Circle, NP, spoke with patient and informed her that her EEG was normal. She verbalized understanding, appreciation.

## 2016-08-23 ENCOUNTER — Encounter (HOSPITAL_COMMUNITY): Payer: Self-pay

## 2016-08-26 ENCOUNTER — Telehealth: Payer: Self-pay | Admitting: Licensed Clinical Social Worker

## 2016-08-26 ENCOUNTER — Telehealth (HOSPITAL_COMMUNITY): Payer: Self-pay | Admitting: Vascular Surgery

## 2016-08-26 ENCOUNTER — Other Ambulatory Visit (HOSPITAL_COMMUNITY): Payer: Self-pay | Admitting: Internal Medicine

## 2016-08-26 MED ORDER — IVABRADINE HCL 5 MG PO TABS: 5.0000 mg | ORAL_TABLET | Freq: Two times a day (BID) | ORAL | 3 refills | Status: DC

## 2016-08-26 NOTE — Telephone Encounter (Signed)
Patient contacted CSW today to inquire about medicaid options as she has multiple medical bills. CSW discussed medicaid process and how to make application for deductible medicaid program. Patient plans to follow up with Social Services. Raquel Sarna, [place], [place]

## 2016-08-26 NOTE — Telephone Encounter (Signed)
Refill Corlanor

## 2016-09-01 DIAGNOSIS — Z853 Personal history of malignant neoplasm of breast: Secondary | ICD-10-CM | POA: Diagnosis not present

## 2016-09-01 DIAGNOSIS — Z6839 Body mass index (BMI) 39.0-39.9, adult: Secondary | ICD-10-CM | POA: Diagnosis not present

## 2016-09-01 DIAGNOSIS — Z113 Encounter for screening for infections with a predominantly sexual mode of transmission: Secondary | ICD-10-CM | POA: Diagnosis not present

## 2016-09-01 DIAGNOSIS — R109 Unspecified abdominal pain: Secondary | ICD-10-CM | POA: Diagnosis not present

## 2016-09-01 DIAGNOSIS — N644 Mastodynia: Secondary | ICD-10-CM | POA: Diagnosis not present

## 2016-09-08 ENCOUNTER — Telehealth (HOSPITAL_COMMUNITY): Payer: Self-pay | Admitting: *Deleted

## 2016-09-08 NOTE — Telephone Encounter (Signed)
Patient called in and left message on triage line asking for [name] to call her regarding her Corlanor prescription.    I tried calling patient back but patient's VM is full and I was unable to leave message.  I did see that refills were sent on 08/26/2016.

## 2016-09-09 ENCOUNTER — Encounter (HOSPITAL_COMMUNITY): Payer: Self-pay

## 2016-09-16 ENCOUNTER — Telehealth (HOSPITAL_COMMUNITY): Payer: Self-pay | Admitting: *Deleted

## 2016-09-16 ENCOUNTER — Ambulatory Visit (HOSPITAL_COMMUNITY): Payer: Medicare Other | Attending: Internal Medicine

## 2016-09-16 DIAGNOSIS — R9439 Abnormal result of other cardiovascular function study: Secondary | ICD-10-CM | POA: Insufficient documentation

## 2016-09-16 DIAGNOSIS — I5022 Chronic systolic (congestive) heart failure: Secondary | ICD-10-CM | POA: Diagnosis not present

## 2016-09-16 NOTE — Telephone Encounter (Signed)
Pt in for her CPX today.  She states she is out of Corlanor and can not get med from pharmacy.  Per chart she was enrolled in [employer] last year and it should have lasted through 10/11/16, however pt states it is out and she can not afford med.  Samples given to pt today, will send message to [place], [place] D to re-enroll pt in [employer].  Pt also ask about handicap parking permit, form completed and signed by Dr Haroldine Laws  Medication Samples have been provided to the patient.  Drug name: Corlanor       Strength: 5mg         Qty: 3  LOT: [id number]  Exp.Date: 12/18  Dosing instructions: 1 tab Twice daily   The patient has been instructed regarding the correct time, dose, and frequency of taking this medication, including desired effects and most common side effects.   Anyelin Mogle 4:12 PM 09/16/2016

## 2016-09-17 ENCOUNTER — Other Ambulatory Visit: Payer: Self-pay | Admitting: Internal Medicine

## 2016-09-17 DIAGNOSIS — I5022 Chronic systolic (congestive) heart failure: Secondary | ICD-10-CM | POA: Diagnosis not present

## 2016-09-17 NOTE — Telephone Encounter (Signed)
Was able to get 2nd $800 [employer] grant for Ms. Zingg's Corlanor through 10/11/16 and then renewed the grant for another $800 from 10/12/16 through 10/11/17. Verified $0 cost with [facility].   Member ID: [id number] Group ID: [id number] RxBin ID: [id number] PCN: [id number] Eligibility Start Date: 10/12/2016 Eligibility End Date: 10/11/2017 Assistance Amount: $800.00   Doroteo Bradford K. [name], PharmD, BCPS, CPP Clinical Pharmacist Pager: [phone] Phone: [phone] 09/17/2016 10:28 AM

## 2016-09-28 ENCOUNTER — Telehealth (HOSPITAL_COMMUNITY): Payer: Self-pay | Admitting: *Deleted

## 2016-09-28 NOTE — Telephone Encounter (Signed)
Pt given CPX results, she will keep appt 5//7 to discuss further with Dr Haroldine Laws

## 2016-10-17 ENCOUNTER — Other Ambulatory Visit (HOSPITAL_COMMUNITY): Payer: Self-pay | Admitting: Internal Medicine

## 2016-10-18 ENCOUNTER — Other Ambulatory Visit (HOSPITAL_COMMUNITY): Payer: Self-pay | Admitting: Cardiology

## 2016-10-18 ENCOUNTER — Ambulatory Visit (HOSPITAL_COMMUNITY)
Admission: RE | Admit: 2016-10-18 | Discharge: 2016-10-18 | Disposition: A | Discharge status: Home / Self Care | Payer: Medicare Other | Source: Ambulatory Visit | Attending: Internal Medicine | Admitting: Internal Medicine

## 2016-10-18 ENCOUNTER — Encounter (HOSPITAL_COMMUNITY): Payer: Self-pay | Admitting: Internal Medicine

## 2016-10-18 VITALS — BP 110/70 | HR 83 | Wt 256.8 lb

## 2016-10-18 DIAGNOSIS — I5022 Chronic systolic (congestive) heart failure: Secondary | ICD-10-CM | POA: Diagnosis not present

## 2016-10-18 DIAGNOSIS — E119 Type 2 diabetes mellitus without complications: Secondary | ICD-10-CM | POA: Diagnosis not present

## 2016-10-18 DIAGNOSIS — Z853 Personal history of malignant neoplasm of breast: Secondary | ICD-10-CM | POA: Insufficient documentation

## 2016-10-18 DIAGNOSIS — I110 Hypertensive heart disease with heart failure: Secondary | ICD-10-CM | POA: Insufficient documentation

## 2016-10-18 DIAGNOSIS — I427 Cardiomyopathy due to drug and external agent: Secondary | ICD-10-CM | POA: Diagnosis not present

## 2016-10-18 DIAGNOSIS — Z6841 Body Mass Index (BMI) 40.0 and over, adult: Secondary | ICD-10-CM | POA: Insufficient documentation

## 2016-10-18 DIAGNOSIS — Z888 Allergy status to other drugs, medicaments and biological substances status: Secondary | ICD-10-CM | POA: Insufficient documentation

## 2016-10-18 DIAGNOSIS — K219 Gastro-esophageal reflux disease without esophagitis: Secondary | ICD-10-CM | POA: Diagnosis not present

## 2016-10-18 DIAGNOSIS — F449 Dissociative and conversion disorder, unspecified: Secondary | ICD-10-CM | POA: Insufficient documentation

## 2016-10-18 DIAGNOSIS — Z79899 Other long term (current) drug therapy: Secondary | ICD-10-CM | POA: Diagnosis not present

## 2016-10-18 MED ORDER — LOSARTAN POTASSIUM 25 MG PO TABS: 25.0000 mg | ORAL_TABLET | Freq: Every day | ORAL | 3 refills | Status: AC

## 2016-10-18 MED ORDER — POTASSIUM CHLORIDE CRYS ER 20 MEQ PO TBCR: 40.0000 meq | EXTENDED_RELEASE_TABLET | Freq: Every day | ORAL | 3 refills | Status: DC

## 2016-10-18 NOTE — Progress Notes (Signed)
Advanced Heart Failure Medication Review by a Pharmacist  Does the patient  feel that his/her medications are working for him/her?  yes  Has the patient been experiencing any side effects to the medications prescribed?  no  Does the patient measure his/her own blood pressure or blood glucose at home?  no   Does the patient have any problems obtaining medications due to transportation or finances?   no  Understanding of regimen: good Understanding of indications: good Potential of compliance: good Patient understands to avoid NSAIDs. Patient understands to avoid decongestants.  Issues to address at subsequent visits: None   Pharmacist comments: [name] is a pleasant 46 yo F presenting with her family members and without a medication list. She reports good compliance with her regimen and did not have any specific medication-related questions or concerns for me at this time.   [name]. [name], PharmD, BCPS, CPP Clinical Pharmacist Pager: [phone] Phone: [phone] 10/18/2016 12:36 PM      Time with patient: 10 minutes Preparation and documentation time: 2 minutes Total time: 12 minutes

## 2016-10-18 NOTE — Patient Instructions (Signed)
Increase Losartan to 25 mg daily  Your physician has requested that you have an echocardiogram. Echocardiography is a painless test that uses sound waves to create images of your heart. It provides your doctor with information about the size and shape of your heart and how well your heart's chambers and valves are working. This procedure takes approximately one hour. There are no restrictions for this procedure.  You have been referred to Cardiac Rehab, they will call you to schedule  Your physician recommends that you schedule a follow-up appointment in: 6 weeks

## 2016-10-18 NOTE — Progress Notes (Signed)
Patient ID: [name], female   DOB: 1971/01/24, 46 y.o.   MRN: [id number]  ADV  Patient ID: [name], female   DOB: February 13, 1971, 46 y.o.   MRN: [id number]  Primary Cardiologist:  Dr. Arvilla Meres Neurlogist: Dohmeir   PCP: Dr. Consuella Lose Griffin/Noel Redmon, PAC   History of Present Illness: [name] is a 46 y.o. female with a history of morbid obesity, depression, fibromyalgia, congestive heart failure secondary to nonischemic cardiomyopathy (EF 20-25%) related to her chemotherapy for breast cancer. Last chemo 2008. Evaluated by neuro for stuttering.  Felt to have a conversion disorder. Confirmed by [facility].   She presented to [facility] on 11/26/14 with near syncope and dyspnea.With + orthostatics in ER (SBP 104 to 87 from lying to sitting) She also had volume overload with acute on chronic CHF. BNP 1093.0. Troponins normal. Had 7 lb  gain over 2 days with dietary indiscretion of sodium.Admitted for IV diuretic therapy and monitoring of BP. Coreg reduced 12.5 mg BID. Symptoms improved and good diuresis with IV lasix therapy. I/Os net negative 6 L total. Was transitioned back to PO Torsemide once euvolemic. Discharge weight was 249 lb.  Evaluated by pulmonary and Dr Vassie Loll told he saw no evidence of asthma.    She returns today for regular follow up. Was feeling really good in the fall. Starting in December started feeling worse. Very fatigued. Struggles with ADLs. Volume status improved. Taking torsemide 40 daily and then takes metolazone about 1-2x/month. Weight up and down. Recent CPX performed shows severe HF limitation. No orthopnea or PND.   ICD interrogation done personally in clinic: No VT/VF. Volume has been up and down but no recent crossings. Activity level about 1-2 hours per day.   Studies:  CPX 4/18   FVC 1.82 (54%)    FEV1 1.15 (42%)     FEV1/FVC 63 (77%)     MVV 58 (54%)  Resting HR: 98 Peak HR: 126  (72% age predicted max HR) BP rest: 86/64 BP peak:  76/60 Peak VO2: 6.4 (38% predicted peak VO2) VE/VCO2 slope: 50 OUES: 0.84 Peak RER: 1.11 Ventilatory Threshold: 5.4 (32% predicted or measured peak VO2) VE/MVV: 71% O2pulse: 6  (55% predicted O2pulse)   CPX Feb 2010. Peak VO2 was 14.5 which was 71% of predicted. When corrected for body weight the VO2 was 20.7. The slope was 27. RER 1.20. O2 pulse 73%. Overall this was felt to be only a very mild functional limitation due to her obesity and mild circulatory limitation.  CPX 2/11: pVO2 13.0 (72%) correct for ideal wt 26ml/kg/min RER 1.06 (submax) slope 28.2 O2 pulse 91% - felt no signifcant cardiac limitation. + deconditioning.   Echo 04/2009  EF back down to 25%.  RHC normal. Echo 07/2009 showed EF 50%  Echo 06/2010 EF 40%. So we did MUGA EF 58% Echo 12/2010 40-45% Echo 05/2011 EF 30%. Grade 2 diastolic dysfunction Cath (12/28/10) was set up and demonstrated EF 20-25% and normal coronary arteries.  RA  4, RV 35/11, PA  28/8 (17), PCWP 8. Fick 5.2/2.4 PVR 1.7 Woods. Fick 5.2/2.4 Aortic saturation was 97%.  PA saturation was 67% and 68%. ECHO 09/03/13 EF 20-25%  ECHO 06/24/14 EF 20-25% Cardiac MRI on 07/19/11: Moderate to severe LVE, Diffuse hypokinesis. EF 32%, Mild LAE ECHO 10/13/15 EF 20-25% Mild to moderate RV dysfunction   Labs  09/03/13 k 3.3 Creatinine 0.86 Pro BNP 998 Dig level 0.7 05/17/14 K 4.7 Creatinine 0.9 pBNP 1007 digoxin 1.2 11/29/14 K 4.2 Creatinine  1.08 Dig 0.3 01/19/16  K 3.7 creatinine 0.90  SH: lived with her husband. Does not drive. Does not drink alcohol or smoke   Past Medical History:  Diagnosis Date  . Acne   . Anemia 1980's   [id number]  . Anxiety   . Arthritis   . 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 Blood transfusion 1980's   1987 or 1988  . Breast calcifications    right breast  . Breast cancer (HCC)    right;  . 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 Cardiac  MRI 05/2011 EF 32%. ICD implantation 10/2011 (Medtronic)  . 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 Conversion disorder   . 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 Depression   . Endometriosis   . Fibromyalgia   . GERD (gastroesophageal reflux disease)   . Hepatomegaly   . History of stomach ulcers   . Hyperlipidemia   . Hypertension    c/b orthostatic hypotention  . ICD (implantable cardiac defibrillator) in place   . Insomnia   . Interstitial cystitis   . Left eye injury 12/2014  . 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   . Nonischemic cardiomyopathy (HCC)    related to chemo; EF 30% 05/2011  . Obesity   . Palpitation    normal sinus rhythm only on 21 day heart monitor  . Panic attacks   . Peripheral neuropathy    chemo- induced  . Pneumonia 2000's   "once"  . Seasonal allergies   . Type II diabetes mellitus (HCC)    Current Outpatient Prescriptions on File Prior to Encounter  Medication Sig Dispense Refill  . acetaminophen (TYLENOL) 325 MG tablet Take 2 tablets (650 mg total) by mouth every 4 (four) hours as needed for headache or mild pain.    Marland Kitchen albuterol (PROVENTIL HFA;VENTOLIN HFA) 108 (90 Base) MCG/ACT inhaler Inhale 1-2 puffs into the lungs daily.    . baclofen (LIORESAL) 10 MG tablet TAKE 1 TABLET BY MOUTH 2 (TWO) TIMES DAILY AS NEEDED FOR MUSCLE SPASMS. 60 tablet 0  . beclomethasone (QVAR) 80 MCG/ACT inhaler Inhale 2 puffs into the lungs 2 (two) times daily.     Marland Kitchen BLACK CURRANT SEED OIL PO Take 1 tablet by mouth.    . carvedilol (COREG) 25 MG tablet Take 0.5 tablets (12.5 mg total) by mouth 2 (two) times daily with a meal. 60 tablet 6  . Cholecalciferol (VITAMIN D3) 5000 UNITS CAPS Take 1 capsule by mouth daily.     . Coenzyme Q10 (CO Q-10) 100 MG CAPS Take 100 mg by mouth daily.    . Cyanocobalamin 2500 MCG SUBL Place 2,500 mcg under the tongue daily.    . digoxin (LANOXIN) 0.125 MG  tablet Take 0.5 tablets (0.0625 mg total) by mouth daily. 30 tablet 2  . digoxin (LANOXIN) 0.125 MG tablet TAKE 0.5 TABLETS (0.0625 MG TOTAL) BY MOUTH DAILY. 15 tablet 2  . divalproex (DEPAKOTE ER) 250 MG 24 hr tablet Take 2 tablets in the morning and 2 tablets at bedtime. 120 tablet 3  . DULoxetine (CYMBALTA) 30 MG capsule TAKE 1 CAPSULE EVERY DAY 30 capsule 5  . ferrous sulfate 325 (65 FE) MG tablet Take 325 mg by mouth daily with breakfast.    . GLUCOSAMINE-CHONDROITIN PO Take 1,200 mg by mouth daily.    . hydrOXYzine (ATARAX/VISTARIL) 25 MG tablet  Take 25 mg by mouth at bedtime as needed for itching (DOESN'T TAKE WITH TRAZODONE).    Marland Kitchen ivabradine (CORLANOR) 5 MG TABS tablet Take 1 tablet (5 mg total) by mouth 2 (two) times daily with a meal. 180 tablet 3  . loratadine (CLARITIN) 10 MG tablet Take 10 mg by mouth daily.    Marland Kitchen losartan (COZAAR) 25 MG tablet TAKE 0.5 TABLETS (12.5 MG TOTAL) BY MOUTH DAILY. 45 tablet 3  . losartan (COZAAR) 25 MG tablet TAKE 0.5 TABLETS (12.5 MG TOTAL) BY MOUTH DAILY. 45 tablet 3  . metolazone (ZAROXOLYN) 2.5 MG tablet Take 1 tablet (2.5 mg total) by mouth daily as needed (edema). (Patient not taking: Reported on 07/20/2016) 15 tablet 0  . Milk Thistle 150 MG CAPS Take 450 mg by mouth.    . Multiple Vitamin (MULTIVITAMIN WITH MINERALS) TABS Take 1 tablet by mouth daily. Reported on 10/13/2015    . omeprazole (PRILOSEC) 40 MG capsule Take 1 capsule (40 mg total) by mouth 2 (two) times daily before a meal. Breakfast and supper 60 capsule 9  . ondansetron (ZOFRAN ODT) 4 MG disintegrating tablet Take 1 tablet (4 mg total) by mouth every 8 (eight) hours as needed for nausea or vomiting. (Patient not taking: Reported on 07/20/2016) 20 tablet 0  . oxyCODONE (ROXICODONE) 5 MG immediate release tablet Take 1 tablet (5 mg total) by mouth every 4 (four) hours as needed for severe pain. (Patient not taking: Reported on 07/20/2016) 24 tablet 0  . PATADAY 0.2 % SOLN Place 1 drop into both  eyes daily.     . pentosan polysulfate (ELMIRON) 100 MG capsule Take 200 mg by mouth daily.    . potassium chloride SA (K-DUR,KLOR-CON) 20 MEQ tablet Take 40 mEq by mouth daily.     Marland Kitchen spironolactone (ALDACTONE) 25 MG tablet Take 0.5 tablets (12.5 mg total) by mouth daily. 45 tablet 6  . torsemide (DEMADEX) 20 MG tablet Take 2 tablets (40 mg total) by mouth daily. 60 tablet 6  . traZODone (DESYREL) 50 MG tablet Take 50 mg by mouth as needed. FOR SLEEP    . zolpidem (AMBIEN) 10 MG tablet Take 1 tablet (10 mg total) by mouth at bedtime as needed for sleep. 30 tablet 0   No current facility-administered medications on file prior to encounter.      Allergies  Allergen Reactions  . Ace Inhibitors Other (See Comments)    hyperkalemia  . Losartan Other (See Comments)    hyperkalemia  . Reglan [Metoclopramide Hcl] Anxiety  . Adhesive [Tape] Itching and Rash  . Other Itching and Rash    Chloraprep  . Chlorhexidine Itching and Rash   ROS: All pertinent positives or negatives as in HPI otherwise negative   Vital Signs: Vitals:   10/18/16 1222  BP: 110/70  Pulse: 83  SpO2: 97%  Weight: 256 lb 12.8 oz (116.5 kg)   Wt Readings from Last 3 Encounters:  10/18/16 256 lb 12.8 oz (116.5 kg)  07/20/16 243 lb 8 oz (110.5 kg)  07/13/16 252 lb 3.2 oz (114.4 kg)   Body mass index is 40.22 kg/m.  PHYSICAL EXAM: General:  Well appearing. No resp difficulty HEENT: normal Neck: supple. JVP 6-7. Carotids 2+ bilat; no bruits. No lymphadenopathy or thryomegaly appreciated. Cor: PMI laterally displaced. Regular rate & rhythm. No rubs, gallops or murmurs. Lungs: clear Abdomen: obese soft, nontender, nondistended. No hepatosplenomegaly. No bruits or masses. Good bowel sounds. Extremities: no cyanosis, clubbing, rash, edema Neuro: alert &  orientedx3, cranial nerves grossly intact. moves all 4 extremities w/o difficulty. Affect pleasant   ASSESSMENT AND PLAN: 1. Chronic systolic CHF: Nonischemic  cardiomyopathy.  Medtronic ICD. Echo 5/17 EF 20-25% with moderately dilated LV.   --Chronic NYHA IIIB. --CPX from 4/18 shows severe HF limitation - significantly decreased from previous - but was done on a day when she had some fluid on board. Given BMI (40) and comorbidities she is not ideal candidate for advanced therapies.  -- I discussed options at length with her and her family including possible RHC to further evaluate hemodynamics. They are reluctant to proceed at this time and want to try a course of cardiac rehab first to see if she will respond.  -- I ffails conservative management can proceed with RHC and if numbers low would see how she responds to inotropes. --ICD interrogated personally. No VT/VT. Optivol up and down but no crossings. Metolazone working well for her.  - Volume status looks good on torsemide 40 daily. Continue to use metolazone as needed - On carvedilol  12.5 mg twice a day. Dose previously reduced due to orthostasis.  - On dig 0.0625 mg daily. (level 0.7 in 5/17). Recheck today  - Continue spiro - Increase losartan 25mg  daily  - Continue ivabradine 5 bid.  - BP too low for Entresto.  - Check labs today   2. Conversion disorder: Followed by neurology  3. Morbid obesity: - Needs to work at diet/exercise for weight loss. Body mass index is 40.22 kg/m.  Total time spent 45 minutes. Over half that time spent discussing above.    Krystal Degrasse,MD 12:24 PM

## 2016-10-21 ENCOUNTER — Other Ambulatory Visit (HOSPITAL_COMMUNITY): Payer: Self-pay

## 2016-10-22 ENCOUNTER — Other Ambulatory Visit (HOSPITAL_COMMUNITY): Payer: Self-pay | Admitting: Internal Medicine

## 2016-10-22 MED ORDER — CARVEDILOL 25 MG PO TABS: 12.5000 mg | ORAL_TABLET | Freq: Two times a day (BID) | ORAL | 3 refills | Status: DC

## 2016-10-28 ENCOUNTER — Ambulatory Visit (HOSPITAL_COMMUNITY)
Admission: RE | Admit: 2016-10-28 | Discharge: 2016-10-28 | Disposition: A | Discharge status: Home / Self Care | Payer: Medicare Other | Source: Ambulatory Visit | Attending: Internal Medicine | Admitting: Internal Medicine

## 2016-10-28 ENCOUNTER — Other Ambulatory Visit (HOSPITAL_COMMUNITY): Payer: Self-pay

## 2016-10-28 ENCOUNTER — Ambulatory Visit (HOSPITAL_COMMUNITY)
Admission: RE | Admit: 2016-10-28 | Discharge: 2016-10-28 | Disposition: A | Discharge status: Home / Self Care | Payer: Medicare Other | Source: Ambulatory Visit | Attending: Family | Admitting: Family

## 2016-10-28 DIAGNOSIS — I5022 Chronic systolic (congestive) heart failure: Secondary | ICD-10-CM

## 2016-10-28 DIAGNOSIS — I081 Rheumatic disorders of both mitral and tricuspid valves: Secondary | ICD-10-CM | POA: Insufficient documentation

## 2016-10-28 LAB — ECHOCARDIOGRAM COMPLETE
CHL CUP RV SYS PRESS: 46 mmHg
CHL CUP TV REG PEAK VELOCITY: 277 cm/s
E/e' ratio: 15.12
EWDT: 222 ms
FS: 6 % — AB (ref 28–44)
IVS/LV PW RATIO, ED: 0.68
LA ID, A-P, ES: 43 mm
LA diam index: 1.91 cm/m2
LA vol A4C: 77.6 ml
LA vol index: 39.1 mL/m2
LA vol: 87.9 mL
LDCA: 3.46 cm2
LEFT ATRIUM END SYS DIAM: 43 mm
LV E/e' medial: 15.12
LV E/e'average: 15.12
LV SIMPSON'S DISK: 24
LV TDI E'LATERAL: 6.09
LV TDI E'MEDIAL: 4.68
LV dias vol index: 91 mL/m2
LV dias vol: 204 mL — AB (ref 46–106)
LVELAT: 6.09 cm/s
LVOT VTI: 12.8 cm
LVOT peak grad rest: 2 mmHg
LVOTD: 21 mm
LVOTPV: 74.4 cm/s
LVOTSV: 44 mL
LVSYSVOL: 156 mL — AB (ref 14–42)
LVSYSVOLIN: 69 mL/m2
MRPISAEROA: 0.09 cm2
MV Dec: 222
MV Peak grad: 3 mmHg
MV VTI: 143 cm
MV pk E vel: 92.1 m/s
MVPKAVEL: 42 m/s
PW: 11.4 mm — AB (ref 0.6–1.1)
RV LATERAL S' VELOCITY: 11.3 cm/s
RV TAPSE: 13.6 mm
Stroke v: 48 ml
TR max vel: 277 cm/s

## 2016-10-28 LAB — BASIC METABOLIC PANEL
ANION GAP: 11 (ref 5–15)
BUN: 12 mg/dL (ref 6–20)
CO2: 29 mmol/L (ref 22–32)
Calcium: 9.1 mg/dL (ref 8.9–10.3)
Chloride: 95 mmol/L — ABNORMAL LOW (ref 101–111)
Creatinine, Ser: 1.09 mg/dL — ABNORMAL HIGH (ref 0.44–1.00)
GFR, EST NON AFRICAN AMERICAN: 60 mL/min — AB (ref >60)
Glucose, Bld: 122 mg/dL — ABNORMAL HIGH (ref 65–99)
POTASSIUM: 3.7 mmol/L (ref 3.5–5.1)
SODIUM: 135 mmol/L (ref 135–145)

## 2016-10-28 NOTE — Progress Notes (Signed)
  Echocardiogram 2D Echocardiogram has been performed.  [name] 10/28/2016, 9:04 AM

## 2016-10-29 ENCOUNTER — Other Ambulatory Visit (HOSPITAL_COMMUNITY): Payer: Self-pay | Admitting: *Deleted

## 2016-10-29 MED ORDER — ONDANSETRON HCL 4 MG PO TABS: 8.0000 mg | ORAL_TABLET | Freq: Three times a day (TID) | ORAL | 3 refills | Status: DC | PRN

## 2016-10-29 MED ORDER — ONDANSETRON HCL 4 MG PO TABS: 4.0000 mg | ORAL_TABLET | Freq: Three times a day (TID) | ORAL | 3 refills | Status: AC | PRN

## 2016-11-02 ENCOUNTER — Other Ambulatory Visit (HOSPITAL_COMMUNITY): Payer: Self-pay | Admitting: Internal Medicine

## 2016-11-02 ENCOUNTER — Telehealth (HOSPITAL_COMMUNITY): Payer: Self-pay | Admitting: *Deleted

## 2016-11-02 NOTE — Telephone Encounter (Signed)
Patient called asking for echo results, I explained that the results have not been reviewed by Dr. Haroldine Laws yet but we would call her with the final results when they're ready. No further questions at this time.

## 2016-11-05 ENCOUNTER — Telehealth (HOSPITAL_COMMUNITY): Payer: Self-pay

## 2016-11-05 NOTE — Telephone Encounter (Signed)
Verified Medicare A/B insurance benefits through [employer]. °Deductible $183.00/183.00 has been met.  °Reference #[id number].... KJ °

## 2016-11-05 NOTE — Telephone Encounter (Signed)
Patient calling for echo results. Advised Dr. Haroldine Laws has not reviewed this study, however in looking at report do not see any significant changes. Will forward to Dr. Haroldine Laws to review.  Renee Pain, RN

## 2016-11-06 NOTE — Telephone Encounter (Signed)
No change 

## 2016-11-09 ENCOUNTER — Other Ambulatory Visit: Payer: Self-pay | Admitting: Internal Medicine

## 2016-11-10 NOTE — Telephone Encounter (Signed)
Patient aware and reminded of upcoming apt 6/19  Renee Pain, RN

## 2016-11-13 ENCOUNTER — Other Ambulatory Visit (HOSPITAL_COMMUNITY): Payer: Self-pay | Admitting: Internal Medicine

## 2016-11-16 ENCOUNTER — Telehealth (HOSPITAL_COMMUNITY): Payer: Self-pay

## 2016-11-16 NOTE — Telephone Encounter (Signed)
I returned patient phone call, patient wanted to go over information about cardiac rehab. As I was talking to patient we got disconnected. I tried to call patient back and on her cell and got voicemail. I left message on patient voicemail and tried to call home phone and know answer or voicemail.

## 2016-11-16 NOTE — Telephone Encounter (Signed)
I called left message on patient voicemail about scheduling for cardiac rehab. I left office contact information on patient voicemail to return call.

## 2016-11-17 ENCOUNTER — Telehealth (HOSPITAL_COMMUNITY): Payer: Self-pay

## 2016-11-17 NOTE — Telephone Encounter (Signed)
Patient returned my call. Patient wants to wait until her daughter returns to school in August before she commits to cardiac rehab classes. Patient will call me when ready to schedule. I mailed patient a cardiac rehab brochure per our conversation.

## 2016-11-23 DIAGNOSIS — M25561 Pain in right knee: Secondary | ICD-10-CM | POA: Diagnosis not present

## 2016-11-23 DIAGNOSIS — G8929 Other chronic pain: Secondary | ICD-10-CM | POA: Diagnosis not present

## 2016-11-23 DIAGNOSIS — M545 Low back pain: Secondary | ICD-10-CM | POA: Diagnosis not present

## 2016-11-23 DIAGNOSIS — M25562 Pain in left knee: Secondary | ICD-10-CM | POA: Diagnosis not present

## 2016-11-30 ENCOUNTER — Encounter (HOSPITAL_COMMUNITY): Payer: Self-pay | Admitting: Internal Medicine

## 2016-12-02 ENCOUNTER — Other Ambulatory Visit: Payer: Self-pay | Admitting: Neurology

## 2016-12-06 ENCOUNTER — Ambulatory Visit: Payer: Medicare Other | Admitting: Family

## 2016-12-06 ENCOUNTER — Telehealth: Payer: Self-pay | Admitting: Internal Medicine

## 2016-12-06 ENCOUNTER — Ambulatory Visit (INDEPENDENT_AMBULATORY_CARE_PROVIDER_SITE_OTHER): Payer: Medicare Other | Admitting: Family Medicine

## 2016-12-06 ENCOUNTER — Encounter: Payer: Self-pay | Admitting: Family Medicine

## 2016-12-06 ENCOUNTER — Other Ambulatory Visit: Payer: Self-pay | Admitting: Family Medicine

## 2016-12-06 VITALS — BP 114/70 | HR 75 | Temp 98.4°F | Resp 16 | Ht 67.0 in | Wt 265.8 lb

## 2016-12-06 DIAGNOSIS — C50911 Malignant neoplasm of unspecified site of right female breast: Secondary | ICD-10-CM | POA: Diagnosis not present

## 2016-12-06 DIAGNOSIS — N644 Mastodynia: Secondary | ICD-10-CM

## 2016-12-06 DIAGNOSIS — N6310 Unspecified lump in the right breast, unspecified quadrant: Secondary | ICD-10-CM | POA: Diagnosis not present

## 2016-12-06 NOTE — Telephone Encounter (Signed)
°  New Prob  Pt has some questions regarding her device: Model and how to get another card.  Please call.

## 2016-12-06 NOTE — Patient Instructions (Signed)
It was a pleasure to meet you today  Please see Stanton Kidney to schedule your mammogram

## 2016-12-06 NOTE — Progress Notes (Signed)
Subjective:    Patient ID: [name], female    DOB: [birth year], 46 y.o.   MRN: [id number]  HPI This is a 46 yo female, accompanied by her husband, who presents today with concern for breast mass on right breast and requests referral for diagnostic mammogram. Last mammo 8/17. Last week she noticed mass of right breast. Has had increased left nipple pain is always erect. She mentioned this to her gyn who told her to keep an eye on it. No nipple discharge from either nipple  Past Medical History:  Diagnosis Date  . Acne   . Anemia 1980's   [id number]  . Anxiety   . Arthritis   . 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 Blood transfusion 1980's   1987 or 1988  . Breast calcifications    right breast  . Breast cancer (HCC)    right;  . CHF (congestive heart failure) ([place])    due to non-ischemic cardiomyopathy, thought to be chemotherapy induced;  cath 7/12: normal cors, EF 20-25%. Cardiac MRI 05/2011 EF 32%. ICD implantation 10/2011 (Medtronic)  . 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 Conversion disorder   . COPD with asthma ([place]) 11/15/2012  . Depression   . Endometriosis   . Fibromyalgia   . GERD (gastroesophageal reflux disease)   . Hepatomegaly   . History of stomach ulcers   . Hyperlipidemia   . Hypertension    c/b orthostatic hypotention  . ICD (implantable cardiac defibrillator) in place   . Insomnia   . Interstitial cystitis   . Left eye injury 12/2014  . Neuropathy due to drug ([place]) 04/27/2013   Chemotherapy induced  Cardiomyopathy, neuropathy, encephalopathy.   . Nonischemic cardiomyopathy ([place])    related to chemo; EF 30% 05/2011  . Obesity   . Palpitation    normal sinus rhythm only on 21 day heart monitor  . Panic attacks   . Peripheral neuropathy    chemo- induced  . Pneumonia 2000's   "once"  . Seasonal allergies   . Type II diabetes mellitus ([place])    Past Surgical History:  Procedure Laterality Date  . ABDOMINAL HYSTERECTOMY     partial  . BREAST  LUMPECTOMY  2008; 2013   right  . CARDIAC DEFIBRILLATOR PLACEMENT  11/03/11  . CESAREAN SECTION    . CHOLECYSTECTOMY  ~ 2009  . COLONOSCOPY    . IMPLANTABLE CARDIOVERTER DEFIBRILLATOR IMPLANT N/A 11/03/2011   Procedure: IMPLANTABLE CARDIOVERTER DEFIBRILLATOR IMPLANT;  Surgeon: Deboraha Sprang, MD; MDT   . LAPAROSCOPIC ENDOMETRIOSIS FULGURATION  1990's  . NASAL SINUS SURGERY    . [place] REMOVAL  2010?   left chest; placed in 2008  . TONSILLECTOMY  1980's  . TUBAL LIGATION  1990's   Family History  Problem Relation Age of Onset  . Heart disease Mother   . Arthritis Mother   . Hypertension Mother   . Stroke Mother   . Fibromyalgia Mother   . Coronary artery disease Unknown   . Heart attack Unknown   . Heart disease Maternal Uncle   . Colon cancer Paternal Uncle   . Heart disease Maternal Grandmother   . Ovarian cancer Paternal Grandmother   . Fibromyalgia Sister    Social History  Substance Use Topics  . Smoking status: Never Smoker  . Smokeless tobacco: Never Used  . Alcohol use Yes     Comment: 11/03/11 "maybe once a month"      Review of Systems Per HPI    Objective:  Physical Exam  Constitutional: She appears well-developed and well-nourished. No distress.  HENT:  Head: Normocephalic and atraumatic.  Cardiovascular: Normal rate.   Pulmonary/Chest: Effort normal. Right breast exhibits no inverted nipple and no nipple discharge. Left breast exhibits no inverted nipple and no nipple discharge. Breasts are asymmetrical.    Skin: She is not diaphoretic.  Vitals reviewed.     BP 114/70 (BP Location: Left Arm, Patient Position: Sitting, Cuff Size: Large)   Pulse 75   Temp 98.4 F (36.9 C) (Oral)   Resp 16   Ht 5\' 7"  (1.702 m)   Wt 265 lb 12.8 oz (120.6 kg)   SpO2 96%   BMI 41.63 kg/m      Assessment & Plan:  1. Breast mass, right - MM DIAG BREAST TOMO UNI RIGHT; Future  2. Malignant neoplasm of right female breast, unspecified estrogen receptor  status, unspecified site of breast ([place]) - MM DIAG BREAST TOMO UNI RIGHT; Future  3. Nipple pain - MM DIAG BREAST TOMO UNI RIGHT; Future  - she will make additional appointment to address routine health issues  Clarene Reamer, FNP-BC  [facility]  12/06/2016 1:03 PM

## 2016-12-07 ENCOUNTER — Other Ambulatory Visit: Payer: Self-pay | Admitting: Family Medicine

## 2016-12-07 ENCOUNTER — Ambulatory Visit
Admission: RE | Admit: 2016-12-07 | Discharge: 2016-12-07 | Disposition: A | Discharge status: Home / Self Care | Payer: Medicare Other | Source: Ambulatory Visit | Attending: Family Medicine | Admitting: Family Medicine

## 2016-12-07 ENCOUNTER — Telehealth: Payer: Self-pay | Admitting: Nurse Practitioner

## 2016-12-07 DIAGNOSIS — N644 Mastodynia: Secondary | ICD-10-CM

## 2016-12-07 DIAGNOSIS — N6310 Unspecified lump in the right breast, unspecified quadrant: Secondary | ICD-10-CM

## 2016-12-07 DIAGNOSIS — N6322 Unspecified lump in the left breast, upper inner quadrant: Secondary | ICD-10-CM | POA: Diagnosis not present

## 2016-12-07 DIAGNOSIS — C50911 Malignant neoplasm of unspecified site of right female breast: Secondary | ICD-10-CM

## 2016-12-07 DIAGNOSIS — R928 Other abnormal and inconclusive findings on diagnostic imaging of breast: Secondary | ICD-10-CM

## 2016-12-07 DIAGNOSIS — N6489 Other specified disorders of breast: Secondary | ICD-10-CM | POA: Diagnosis not present

## 2016-12-07 DIAGNOSIS — C50212 Malignant neoplasm of upper-inner quadrant of left female breast: Secondary | ICD-10-CM | POA: Diagnosis not present

## 2016-12-07 DIAGNOSIS — N6320 Unspecified lump in the left breast, unspecified quadrant: Secondary | ICD-10-CM

## 2016-12-07 NOTE — Telephone Encounter (Signed)
Spoke w/ pt and gave her requested information. Pt verbalized understanding.

## 2016-12-07 NOTE — Telephone Encounter (Signed)
New message    Pt is calling stating she forgot to ask a question when she just spoke to someone. Please call.

## 2016-12-07 NOTE — Telephone Encounter (Signed)
LMTCB//sss 

## 2016-12-09 DIAGNOSIS — F449 Dissociative and conversion disorder, unspecified: Secondary | ICD-10-CM | POA: Diagnosis not present

## 2016-12-09 DIAGNOSIS — Z853 Personal history of malignant neoplasm of breast: Secondary | ICD-10-CM | POA: Diagnosis not present

## 2016-12-09 DIAGNOSIS — Z6841 Body Mass Index (BMI) 40.0 and over, adult: Secondary | ICD-10-CM | POA: Diagnosis not present

## 2016-12-09 DIAGNOSIS — I5022 Chronic systolic (congestive) heart failure: Secondary | ICD-10-CM | POA: Diagnosis not present

## 2016-12-09 DIAGNOSIS — N6310 Unspecified lump in the right breast, unspecified quadrant: Secondary | ICD-10-CM | POA: Diagnosis not present

## 2016-12-09 DIAGNOSIS — C50212 Malignant neoplasm of upper-inner quadrant of left female breast: Secondary | ICD-10-CM | POA: Diagnosis not present

## 2016-12-09 DIAGNOSIS — M797 Fibromyalgia: Secondary | ICD-10-CM | POA: Diagnosis not present

## 2016-12-09 DIAGNOSIS — Z87898 Personal history of other specified conditions: Secondary | ICD-10-CM | POA: Diagnosis not present

## 2016-12-09 DIAGNOSIS — F09 Unspecified mental disorder due to known physiological condition: Secondary | ICD-10-CM | POA: Diagnosis not present

## 2016-12-09 DIAGNOSIS — I10 Essential (primary) hypertension: Secondary | ICD-10-CM | POA: Diagnosis not present

## 2016-12-09 DIAGNOSIS — Z95810 Presence of automatic (implantable) cardiac defibrillator: Secondary | ICD-10-CM | POA: Diagnosis not present

## 2016-12-09 DIAGNOSIS — F0789 Other personality and behavioral disorders due to known physiological condition: Secondary | ICD-10-CM | POA: Diagnosis not present

## 2016-12-13 ENCOUNTER — Telehealth (HOSPITAL_COMMUNITY): Payer: Self-pay | Admitting: *Deleted

## 2016-12-13 NOTE — Telephone Encounter (Signed)
Patient called triage line and left message asking for [place] to call her but did not state the reason why.  I called her back but had to leave message asking for her to return our call.

## 2016-12-14 ENCOUNTER — Telehealth (HOSPITAL_COMMUNITY): Payer: Self-pay | Admitting: *Deleted

## 2016-12-14 ENCOUNTER — Other Ambulatory Visit: Payer: Self-pay | Admitting: General Surgery

## 2016-12-14 DIAGNOSIS — M545 Low back pain: Secondary | ICD-10-CM | POA: Diagnosis not present

## 2016-12-14 DIAGNOSIS — C50212 Malignant neoplasm of upper-inner quadrant of left female breast: Secondary | ICD-10-CM | POA: Diagnosis not present

## 2016-12-14 DIAGNOSIS — M25561 Pain in right knee: Secondary | ICD-10-CM | POA: Diagnosis not present

## 2016-12-14 DIAGNOSIS — M25562 Pain in left knee: Secondary | ICD-10-CM | POA: Diagnosis not present

## 2016-12-14 DIAGNOSIS — G8929 Other chronic pain: Secondary | ICD-10-CM | POA: Diagnosis not present

## 2016-12-14 NOTE — Telephone Encounter (Signed)
Patient called triage line and wanted Dr. Haroldine Laws know that she is unable to participate in Cardiac Rehab due to a gout flare up.  She stated her Orthopaedics is treating her for this and wants her to hold off on Cardiac Rehab.    I have called Cardiac Rehab and made them aware.  I will forward message to Dr. Haroldine Laws so he can readdress it at patient's next appointment on August 2nd.

## 2016-12-17 ENCOUNTER — Encounter: Payer: Self-pay | Admitting: Genetics

## 2016-12-17 ENCOUNTER — Telehealth: Payer: Self-pay | Admitting: Oncology

## 2016-12-17 ENCOUNTER — Encounter: Payer: Self-pay | Admitting: Radiation Oncology

## 2016-12-17 NOTE — Progress Notes (Signed)
Location of Breast Cancer: Left Breast  Histology per Pathology Report:  12/07/16 Diagnosis Breast, left, needle core biopsy, 9:30 o'clock - INVASIVE DUCTAL CARCINOMA. - DUCTAL CARCINOMA IN SITU  Receptor Status: ER(100%), PR (NEG), Her2-neu (NEG), Ki-(10%)  Did patient present with symptoms or was this found on screening mammography?: She palpated a lump on the Right Breast. and presented to her primary care physician. She also mentioned itching to her Left Nipple while getting the lump checked and a bilateral Breast MRI was completed.   Past/Anticipated interventions by surgeon, if any: 12/09/16 Dr. Dalbert Batman. Referred to Medical and Radiation Oncology.   Past/Anticipated interventions by medical oncology, if any:  Dr. Jana Hakim 12/28/16  -- 2008 Adriamycin/Cytoxan x 4 cycles, followed by Taxol/Avastin; chemo was stopped d/t cardiomyopathy with EF decreasing to 35%. --Letrozole for 5 years.   Lymphedema issues, if any:   N/A  Pain issues, if any:  She reports pain from fibromyalgia a 8/10  SAFETY ISSUES:  Prior radiation? Yes, Right Breast 05/17/2007, 5040 cGy plus a 1260 Gy Boost.   Pacemaker/ICD? Yes, Cardiac Defibrillator Placement 11/03/11  Possible current pregnancy? No, tubal ligation history.   Is the patient on methotrexate? No, she does tell me that she was recently diagnosed with Rheumatoid Arthritis and has an apt next week for evaluation.   Current Complaints / other details:   History of Stage IIB right breast invasive ductal carcinoma, ER+/PR+/HER2-, diagnosed in 08/2006, treated with lumpectomy & ALND, where 1/17 LNs was positive for metastatic carcinoma.  She went on to participate in the ECOG study for adjuvant chemotherapy (arm D), which consisted of Adriamycin/Cytoxan x 4 cycles, followed by Taxol/Avastin; chemo was stopped d/t cardiomyopathy with EF decreasing to 35%.  She went on to complete adjuvant radiation therapy and 5 years of anti-estrogen therapy with  Letrozole from 05/2007-08/2011.  BP 105/73   Pulse 75   Temp 98.1 F (36.7 C)   Ht '5\' 7"'$  (1.702 m)   Wt 262 lb 3.2 oz (118.9 kg)   SpO2 96% Comment: rooom air  BMI 41.07 kg/m    Wt Readings from Last 3 Encounters:  12/22/16 262 lb 3.2 oz (118.9 kg)  12/06/16 265 lb 12.8 oz (120.6 kg)  10/18/16 256 lb 12.8 oz (116.5 kg)      Jayveon Convey, Stephani Police, RN 12/17/2016,11:21 AM

## 2016-12-17 NOTE — Telephone Encounter (Signed)
sw pt to confirm 7/17 appt per sch msg

## 2016-12-20 ENCOUNTER — Inpatient Hospital Stay
Admission: RE | Admit: 2016-12-20 | Discharge: 2016-12-20 | Disposition: A | Discharge status: Home / Self Care | Payer: Self-pay | Source: Ambulatory Visit | Attending: General Surgery | Admitting: General Surgery

## 2016-12-20 ENCOUNTER — Encounter: Payer: Self-pay | Admitting: Genetics

## 2016-12-22 ENCOUNTER — Ambulatory Visit
Admission: RE | Admit: 2016-12-22 | Discharge: 2016-12-22 | Disposition: A | Discharge status: Home / Self Care | Payer: Medicare Other | Source: Ambulatory Visit | Attending: Radiation Oncology | Admitting: Radiation Oncology

## 2016-12-22 ENCOUNTER — Encounter: Payer: Self-pay | Admitting: Radiation Oncology

## 2016-12-22 ENCOUNTER — Telehealth: Payer: Self-pay | Admitting: *Deleted

## 2016-12-22 ENCOUNTER — Telehealth: Payer: Self-pay

## 2016-12-22 DIAGNOSIS — M797 Fibromyalgia: Secondary | ICD-10-CM | POA: Insufficient documentation

## 2016-12-22 DIAGNOSIS — Z95810 Presence of automatic (implantable) cardiac defibrillator: Secondary | ICD-10-CM | POA: Insufficient documentation

## 2016-12-22 DIAGNOSIS — I110 Hypertensive heart disease with heart failure: Secondary | ICD-10-CM | POA: Insufficient documentation

## 2016-12-22 DIAGNOSIS — F329 Major depressive disorder, single episode, unspecified: Secondary | ICD-10-CM | POA: Diagnosis not present

## 2016-12-22 DIAGNOSIS — Z923 Personal history of irradiation: Secondary | ICD-10-CM | POA: Diagnosis not present

## 2016-12-22 DIAGNOSIS — E1142 Type 2 diabetes mellitus with diabetic polyneuropathy: Secondary | ICD-10-CM | POA: Diagnosis not present

## 2016-12-22 DIAGNOSIS — C50212 Malignant neoplasm of upper-inner quadrant of left female breast: Secondary | ICD-10-CM | POA: Insufficient documentation

## 2016-12-22 DIAGNOSIS — Z853 Personal history of malignant neoplasm of breast: Secondary | ICD-10-CM | POA: Diagnosis not present

## 2016-12-22 DIAGNOSIS — Z79899 Other long term (current) drug therapy: Secondary | ICD-10-CM | POA: Insufficient documentation

## 2016-12-22 DIAGNOSIS — Z170 Estrogen receptor positive status [ER+]: Secondary | ICD-10-CM | POA: Insufficient documentation

## 2016-12-22 DIAGNOSIS — I509 Heart failure, unspecified: Secondary | ICD-10-CM | POA: Diagnosis not present

## 2016-12-22 DIAGNOSIS — E785 Hyperlipidemia, unspecified: Secondary | ICD-10-CM | POA: Diagnosis not present

## 2016-12-22 DIAGNOSIS — K219 Gastro-esophageal reflux disease without esophagitis: Secondary | ICD-10-CM | POA: Insufficient documentation

## 2016-12-22 DIAGNOSIS — F419 Anxiety disorder, unspecified: Secondary | ICD-10-CM | POA: Diagnosis not present

## 2016-12-22 DIAGNOSIS — J449 Chronic obstructive pulmonary disease, unspecified: Secondary | ICD-10-CM | POA: Insufficient documentation

## 2016-12-22 NOTE — Progress Notes (Signed)
Radiation Oncology         [phone] ________________________________  Initial Outpatient Consultation  Name: [name] MRN: [id number]  Date: 12/22/2016  DOB: 15-Jan-1971  EL:FYBOFB, Krystal Specter, FNP  Krystal Skates, MD   REFERRING PHYSICIAN: Fanny Skates, MD  DIAGNOSIS:    ICD-10-CM   1. 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  CHIEF COMPLAINT: Here to discuss management of her invasive ductal carcinoma of the left breast.  HISTORY OF PRESENT ILLNESS:[name] is a 46 y.o. female who presented recently with invasive ductal carcinoma of the left breast at the 9:30 o'clock position.  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 She underwent a  lumpectomy ALND, 1/17 lymph node were positive, was treated with radiation in December of 2008 and had chemotherapy at that time.She completed adjuvant radiation therapy and five years of anti-estrogen therapy with Letrozole (patient recalls that it was tamoxifen) from 05/2007 - 08/2011 given by Dr. Valere Dross.   More recently, Left breast Biopsy on 12/07/2016 showed invasive ductal carcinoma, ductal carcinoma in SITU with characteristics as described above in the diagnosis. She has a marketly decreased ejection fraction due to cardiomyopathy and because of post chemotherapy.She recently presented with a palpable right breast lump for two weeks. Mammography was unremarkable on the right but incidentally revealed a left mass which was spiculated. US revealed an area most consistent with fat necrosis in the right breast, biopsy is pending. Left breast US revealed a 8 mm at the 9:30 position, axilla was negative, biopsy of the left breast mass revealed grade 2   Invasive ductal carcinoma that is ER+/PR (NEG) /HER2 (NEG). She was discussed at the tumor board conference. Repeat genetic panel, per tumor board.  Also it was discussed that breast conserving may be more prudent then mastectomy given cardiac issues. Patient has an active ICD installed by Dr. Caryl Comes, who the patient sees once a year . Patient presents today with her husband.   PREVIOUS RADIATION THERAPY: Yes, Right Breast 12/32008, 5040 cGy plus a 1260 Gy Boost.  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    PAST SURGICAL HISTORY: Past Surgical History:  Procedure Laterality Date  . ABDOMINAL HYSTERECTOMY     partial  . BREAST EXCISIONAL BIOPSY     right benign 2012  . BREAST LUMPECTOMY  2008; 2013   right radiation  . CARDIAC DEFIBRILLATOR PLACEMENT  11/03/11  . CESAREAN SECTION    . CHOLECYSTECTOMY  ~ 2009  . COLONOSCOPY    . IMPLANTABLE CARDIOVERTER DEFIBRILLATOR IMPLANT N/A 11/03/2011   Procedure: IMPLANTABLE CARDIOVERTER DEFIBRILLATOR IMPLANT;  Surgeon: Deboraha Sprang, MD; MDT   . LAPAROSCOPIC ENDOMETRIOSIS FULGURATION  1990's  . NASAL SINUS SURGERY    .  [place] REMOVAL  2010?   left chest; placed in 2008  . TONSILLECTOMY   1980's  . TUBAL LIGATION  1990's    FAMILY HISTORY: family history includes Arthritis in her mother; Colon cancer in her paternal uncle; Fibromyalgia in her mother and sister; Heart disease in her maternal grandmother, maternal uncle, and mother; Hypertension in her mother; Ovarian cancer in her paternal grandmother; Stroke in her mother.  SOCIAL HISTORY:  reports that she has never smoked. She has never used smokeless tobacco. She reports that she drinks alcohol. She reports that she does not use drugs.  ALLERGIES: Ace inhibitors; Losartan; Reglan [metoclopramide hcl]; and Adhesive [tape]  MEDICATIONS:  Current Outpatient Prescriptions  Medication Sig Dispense Refill  . acetaminophen (TYLENOL) 325 MG tablet Take 2 tablets (650 mg total) by mouth every 4 (four) hours as needed for headache or mild pain.    Marland Kitchen albuterol (PROVENTIL HFA;VENTOLIN HFA) 108 (90 Base) MCG/ACT inhaler Inhale 1-2 puffs into the lungs daily.    . baclofen (LIORESAL) 10 MG tablet TAKE 1 TABLET BY MOUTH 2 (TWO) TIMES DAILY AS NEEDED FOR MUSCLE SPASMS. 60 tablet 0  . BLACK CURRANT SEED OIL PO Take 1 tablet by mouth.    . carvedilol (COREG) 25 MG tablet Take 0.5 tablets (12.5 mg total) by mouth 2 (two) times daily with a meal. 60 tablet 6  . Cholecalciferol (VITAMIN D3) 5000 UNITS CAPS Take 1 capsule by mouth daily.     . Coenzyme Q10 (CO Q-10) 100 MG CAPS Take 100 mg by mouth daily.    . Cyanocobalamin 2500 MCG SUBL Place 2,500 mcg under the tongue daily.    . digoxin (LANOXIN) 0.125 MG tablet Take 0.5 tablets (0.0625 mg total) by mouth daily. 30 tablet 2  . divalproex (DEPAKOTE ER) 250 MG 24 hr tablet (07/18/2016) TAKE 2 TABLETS IN THE MORNING AND 2 TABLETS AT BEDTIME. 120 tablet 3  . DULoxetine (CYMBALTA) 30 MG capsule TAKE 1 CAPSULE EVERY DAY 30 capsule 5  . ferrous sulfate 325 (65 FE) MG tablet Take 325 mg by mouth daily with breakfast.    . GLUCOSAMINE-CHONDROITIN PO Take 1,200 mg by mouth daily.    . hydrOXYzine  (ATARAX/VISTARIL) 25 MG tablet Take 25 mg by mouth at bedtime as needed for itching (DOESN'T TAKE WITH TRAZODONE).    Marland Kitchen ivabradine (CORLANOR) 5 MG TABS tablet Take 1 tablet (5 mg total) by mouth 2 (two) times daily with a meal. 180 tablet 3  . loratadine (CLARITIN) 10 MG tablet Take 10 mg by mouth daily.    Marland Kitchen losartan (COZAAR) 25 MG tablet Take 1 tablet (25 mg total) by mouth daily. 90 tablet 3  . metolazone (ZAROXOLYN) 2.5 MG tablet Take 1 tablet (2.5 mg total) by mouth daily as needed (edema). 15 tablet 0  . Milk Thistle 150 MG CAPS Take 450 mg by mouth daily.     . Multiple Vitamin (MULTIVITAMIN WITH MINERALS) TABS Take 1 tablet by mouth daily. Reported on 10/13/2015    . omeprazole (PRILOSEC) 40 MG capsule Take 1 capsule (40 mg total) by mouth 2 (two) times daily before a meal. Breakfast and supper 60 capsule 9  . ondansetron (ZOFRAN) 4 MG tablet Take 1 tablet (4 mg total) by mouth every 8 (eight) hours as needed for nausea or vomiting. 20 tablet 3  . PATADAY 0.2 % SOLN Place 1 drop into both eyes daily.     . pentosan polysulfate (ELMIRON) 100 MG capsule Take 200 mg by mouth  daily.    . potassium chloride SA (K-DUR,KLOR-CON) 20 MEQ tablet Take 2 tablets (40 mEq total) by mouth daily. 180 tablet 3  . spironolactone (ALDACTONE) 25 MG tablet Take 0.5 tablets (12.5 mg total) by mouth daily. 45 tablet 6  . torsemide (DEMADEX) 20 MG tablet TAKE 2 TABLETS (40 MG TOTAL) BY MOUTH DAILY. 60 tablet 6  . traZODone (DESYREL) 50 MG tablet Take 50 mg by mouth as needed. FOR SLEEP     No current facility-administered medications for this encounter.     REVIEW OF SYSTEMS: A 10+ POINT REVIEW OF SYSTEMS WAS OBTAINED including neurology, dermatology, psychiatry, cardiac, respiratory, lymph, extremities, GI, GU, Musculoskeletal, constitutional, breasts, reproductive, HEENT. Patient complains of shortness of breath.  All pertinent positives are noted in the HPI.  All others are negative.   PHYSICAL EXAM:  height  is 5' 7" (1.702 m) and weight is 262 lb 3.2 oz (118.9 kg). Her temperature is 98.1 F (36.7 C). Her blood pressure is 105/73 and her pulse is 75. Her oxygen saturation is 96%.   General: Alert and oriented, in no acute distress HEENT: Head is normocephalic. Extraocular movements are intact. Oropharynx is clear. Neck: Neck is supple, no palpable cervical or supraclavicular lymphadenopathy. Heart: Regular in rate with no murmurs, rubs, or gallops. Systolic rhythm is loudest in the aortic region Chest: Clear to auscultation bilaterally, with no rhonchi, wheezes, or rales. Abdomen: Distended,Soft, nontender, with no rigidity or guarding. Extremities: No cyanosis or edema. Lymphatics: see Neck Exam Skin: No concerning lesions. Musculoskeletal: symmetric strength and muscle tone throughout. Neurologic: Cranial nerves II through XII are grossly intact. No obvious focalities. Speech is fluent. Coordination is intact. Psychiatric: Judgment and insight are intact. Affect is appropriate. Left Breast: Thickening in UIQ of the left breast.  No other palpable masses appreciated in the breast or axillae . Right Breast: Breast on right is Significantly smaller, in the 4 o'clock position there's firmness that is 2 cm in greatest dimension, no other palpable masses appreciated in the breasts or axillae  ECOG = 2 0 - Asymptomatic (Fully active, able to carry on all predisease activities without restriction)  1 - Symptomatic but completely ambulatory (Restricted in physically strenuous activity but ambulatory and able to carry out work of a light or sedentary nature. For example, light housework, office work)  2 - Symptomatic, <50% in bed during the day (Ambulatory and capable of all self care but unable to carry out any work activities. Up and about more than 50% of waking hours)  3 - Symptomatic, >50% in bed, but not bedbound (Capable of only limited self-care, confined to bed or chair 50% or more of waking  hours)  4 - Bedbound (Completely disabled. Cannot carry on any self-care. Totally confined to bed or chair)  5 - Death   Eustace Pen MM, Creech RH, Tormey DC, et al. [phone]). "Toxicity and response criteria of the [facility]". [place] Oncol. 5 (6): 649-55   LABORATORY DATA:  Lab Results  Component Value Date   WBC 5.6 07/13/2016   HGB 12.3 07/13/2016   HCT 36.3 07/13/2016   MCV 95 07/13/2016   PLT 181 07/13/2016   CMP     Component Value Date/Time   NA 135 10/28/2016 0851   NA 139 07/13/2016 1606   NA 141 09/19/2014 1133   K 3.7 10/28/2016 0851   K 3.5 09/19/2014 1133   CL 95 (L) 10/28/2016 0851   CO2 29 10/28/2016 0851  CO2 23 09/19/2014 1133   GLUCOSE 122 (H) 10/28/2016 0851   GLUCOSE 103 09/19/2014 1133   BUN 12 10/28/2016 0851   BUN 17 07/13/2016 1606   BUN 11.6 09/19/2014 1133   CREATININE 1.09 (H) 10/28/2016 0851   CREATININE 0.9 09/19/2014 1133   CALCIUM 9.1 10/28/2016 0851   CALCIUM 8.6 09/19/2014 1133   PROT 8.2 07/13/2016 1606   PROT 7.2 09/19/2014 1133   ALBUMIN 4.4 07/13/2016 1606   ALBUMIN 3.4 (L) 09/19/2014 1133   AST 21 07/13/2016 1606   AST 21 09/19/2014 1133   ALT 12 07/13/2016 1606   ALT 14 09/19/2014 1133   ALKPHOS 99 07/13/2016 1606   ALKPHOS 87 09/19/2014 1133   BILITOT 0.8 07/13/2016 1606   BILITOT 1.62 (H) 09/19/2014 1133   GFRNONAA 60 (L) 10/28/2016 0851   GFRAA >60 10/28/2016 0851         RADIOGRAPHY: US Breast Ltd Uni Left Inc Axilla  Result Date: 12/07/2016 CLINICAL DATA:  History of right breast cancer in 2012 status post lumpectomy and radiation therapy. Patient describes a new palpable lump within the inner right breast. EXAM: 2D DIGITAL DIAGNOSTIC BILATERAL MAMMOGRAM WITH CAD AND ADJUNCT TOMO ULTRASOUND BILATERAL BREAST COMPARISON:  Previous exam(s). ACR Breast Density Category b: There are scattered areas of fibroglandular density. FINDINGS: Right breast: There are stable postsurgical changes within the  right breast. There are no new dominant masses, suspicious calcifications or secondary signs of malignancy within the right breast. Specifically, there is no mammographic abnormality within the inner right breast corresponding to the area of clinical concern. Left breast: There is an irregular mass within the inner left breast, at middle to posterior depth, with associated architectural distortion, measuring approximately 7 mm greatest dimension. Mammographic images were processed with CAD. Right breast: Targeted ultrasound is performed, showing a predominantly hyperechoic area in the right breast at the 4 o'clock axis, 5 cm from the nipple, at superficial depth, measuring 1.8 x 1.2 x 1.7 cm, with tiny cystic foci internally, avascular, most suggestive of benign fat necrosis during real-time ultrasound evaluation. Left breast: Targeted ultrasound is performed, showing an irregular hypoechoic mass in the left breast at the 9:30 o'clock axis, 6 cm from the nipple, measuring 8 mm, corresponding to the mammographic finding. Left axilla was evaluated with ultrasound showing no enlarged or morphologically abnormal lymph nodes. IMPRESSION: 1. Suspicious mass within the left breast at the 9:30 o'clock axis, 6 cm from the nipple, measuring 8 mm, corresponding to an incidental mammographic finding. Ultrasound-guided biopsy core is recommended. 2. Probably benign fat necrosis within the right breast at the 4 o'clock axis, 5 cm from the nipple, measuring 1.8 x 1.2 x 1.7 cm. Recommend follow-up diagnostic exam in 3 months to ensure resolution. RECOMMENDATION: 1. Ultrasound-guided biopsy for the left breast mass at the 9:30 o'clock axis, 6 cm from the nipple, measuring 8 mm. 2. Follow-up right breast diagnostic mammogram and ultrasound in 3 months to ensure resolution of the probably benign fat necrosis. Ultrasound-guided core biopsy for the left breast mass is scheduled for later today. I have discussed the findings and  recommendations with the patient. Results were also provided in writing at the conclusion of the visit. If applicable, a reminder letter will be sent to the patient regarding the next appointment. BI-RADS CATEGORY  4: Suspicious. Electronically Signed   By: Franki Cabot M.D.   On: 12/07/2016 12:06   US Breast Ltd Uni Right Inc Axilla  Result Date: 12/07/2016 CLINICAL DATA:  History  of right breast cancer in 2012 status post lumpectomy and radiation therapy. Patient describes a new palpable lump within the inner right breast. EXAM: 2D DIGITAL DIAGNOSTIC BILATERAL MAMMOGRAM WITH CAD AND ADJUNCT TOMO ULTRASOUND BILATERAL BREAST COMPARISON:  Previous exam(s). ACR Breast Density Category b: There are scattered areas of fibroglandular density. FINDINGS: Right breast: There are stable postsurgical changes within the right breast. There are no new dominant masses, suspicious calcifications or secondary signs of malignancy within the right breast. Specifically, there is no mammographic abnormality within the inner right breast corresponding to the area of clinical concern. Left breast: There is an irregular mass within the inner left breast, at middle to posterior depth, with associated architectural distortion, measuring approximately 7 mm greatest dimension. Mammographic images were processed with CAD. Right breast: Targeted ultrasound is performed, showing a predominantly hyperechoic area in the right breast at the 4 o'clock axis, 5 cm from the nipple, at superficial depth, measuring 1.8 x 1.2 x 1.7 cm, with tiny cystic foci internally, avascular, most suggestive of benign fat necrosis during real-time ultrasound evaluation. Left breast: Targeted ultrasound is performed, showing an irregular hypoechoic mass in the left breast at the 9:30 o'clock axis, 6 cm from the nipple, measuring 8 mm, corresponding to the mammographic finding. Left axilla was evaluated with ultrasound showing no enlarged or morphologically  abnormal lymph nodes. IMPRESSION: 1. Suspicious mass within the left breast at the 9:30 o'clock axis, 6 cm from the nipple, measuring 8 mm, corresponding to an incidental mammographic finding. Ultrasound-guided biopsy core is recommended. 2. Probably benign fat necrosis within the right breast at the 4 o'clock axis, 5 cm from the nipple, measuring 1.8 x 1.2 x 1.7 cm. Recommend follow-up diagnostic exam in 3 months to ensure resolution. RECOMMENDATION: 1. Ultrasound-guided biopsy for the left breast mass at the 9:30 o'clock axis, 6 cm from the nipple, measuring 8 mm. 2. Follow-up right breast diagnostic mammogram and ultrasound in 3 months to ensure resolution of the probably benign fat necrosis. Ultrasound-guided core biopsy for the left breast mass is scheduled for later today. I have discussed the findings and recommendations with the patient. Results were also provided in writing at the conclusion of the visit. If applicable, a reminder letter will be sent to the patient regarding the next appointment. BI-RADS CATEGORY  4: Suspicious. Electronically Signed   By: Franki Cabot M.D.   On: 12/07/2016 12:06   Mm Diag Breast Tomo Bilateral  Result Date: 12/07/2016 CLINICAL DATA:  History of right breast cancer in 2012 status post lumpectomy and radiation therapy. Patient describes a new palpable lump within the inner right breast. EXAM: 2D DIGITAL DIAGNOSTIC BILATERAL MAMMOGRAM WITH CAD AND ADJUNCT TOMO ULTRASOUND BILATERAL BREAST COMPARISON:  Previous exam(s). ACR Breast Density Category b: There are scattered areas of fibroglandular density. FINDINGS: Right breast: There are stable postsurgical changes within the right breast. There are no new dominant masses, suspicious calcifications or secondary signs of malignancy within the right breast. Specifically, there is no mammographic abnormality within the inner right breast corresponding to the area of clinical concern. Left breast: There is an irregular mass  within the inner left breast, at middle to posterior depth, with associated architectural distortion, measuring approximately 7 mm greatest dimension. Mammographic images were processed with CAD. Right breast: Targeted ultrasound is performed, showing a predominantly hyperechoic area in the right breast at the 4 o'clock axis, 5 cm from the nipple, at superficial depth, measuring 1.8 x 1.2 x 1.7 cm, with tiny cystic foci internally,  avascular, most suggestive of benign fat necrosis during real-time ultrasound evaluation. Left breast: Targeted ultrasound is performed, showing an irregular hypoechoic mass in the left breast at the 9:30 o'clock axis, 6 cm from the nipple, measuring 8 mm, corresponding to the mammographic finding. Left axilla was evaluated with ultrasound showing no enlarged or morphologically abnormal lymph nodes. IMPRESSION: 1. Suspicious mass within the left breast at the 9:30 o'clock axis, 6 cm from the nipple, measuring 8 mm, corresponding to an incidental mammographic finding. Ultrasound-guided biopsy core is recommended. 2. Probably benign fat necrosis within the right breast at the 4 o'clock axis, 5 cm from the nipple, measuring 1.8 x 1.2 x 1.7 cm. Recommend follow-up diagnostic exam in 3 months to ensure resolution. RECOMMENDATION: 1. Ultrasound-guided biopsy for the left breast mass at the 9:30 o'clock axis, 6 cm from the nipple, measuring 8 mm. 2. Follow-up right breast diagnostic mammogram and ultrasound in 3 months to ensure resolution of the probably benign fat necrosis. Ultrasound-guided core biopsy for the left breast mass is scheduled for later today. I have discussed the findings and recommendations with the patient. Results were also provided in writing at the conclusion of the visit. If applicable, a reminder letter will be sent to the patient regarding the next appointment. BI-RADS CATEGORY  4: Suspicious. Electronically Signed   By: Franki Cabot M.D.   On: 12/07/2016 12:06   Mm  Clip Placement Left  Result Date: 12/07/2016 CLINICAL DATA:  Status post ultrasound-guided biopsy earlier today for a left breast mass. EXAM: DIAGNOSTIC LEFT MAMMOGRAM POST ULTRASOUND BIOPSY COMPARISON:  Previous exam(s). FINDINGS: Mammographic images were obtained following ultrasound guided biopsy of the left breast mass at the 9:30 o'clock axis. At the conclusion of the procedure, a ribbon shaped tissue marker was placed at the biopsy site. Biopsy clip is well-positioned at the site of the targeted mass, corresponding to the original mammographic finding. IMPRESSION: Ribbon shaped biopsy clip is well-positioned at the site of the targeted mass within the inner left breast, corresponding to the original mammographic finding. Final Assessment: Post Procedure Mammograms for Marker Placement Electronically Signed   By: Franki Cabot M.D.   On: 12/07/2016 14:29   [place] Lt Breast Bx W Loc Dev 1st Lesion Img Bx Spec US Guide  Addendum Date: 12/08/2016   ADDENDUM REPORT: 12/08/2016 14:39 ADDENDUM: Pathology revealed GRADE II INVASIVE DUCTAL CARCINOMA, DUCTAL CARCINOMA IN SITU of the Left breast, 9:30 o'clock. This was found to be concordant by Dr. Franki Cabot. Pathology results were discussed with the patient by telephone. The patient reported doing well after the biopsy with tenderness at the site. Post biopsy instructions and care were reviewed and questions were answered. The patient was encouraged to call [facility] for any additional concerns. Surgical consultation has been arranged with Dr. Fanny Roy at [facility] on December 09, 2016. Pathology results reported by Terie Purser, RN on 12/08/2016. Electronically Signed   By: Franki Cabot M.D.   On: 12/08/2016 14:39   Result Date: 12/08/2016 CLINICAL DATA:  Patient with a suspicious left breast mass found on diagnostic examination earlier today presents now for ultrasound-guided core biopsy. History of right breast  cancer status post lumpectomy. EXAM: ULTRASOUND GUIDED LEFT BREAST CORE NEEDLE BIOPSY COMPARISON:  Previous exam(s). PROCEDURE: I met with the patient and we discussed the procedure of ultrasound-guided biopsy, including benefits and alternatives. We discussed the high likelihood of a successful procedure. We discussed the risks of the procedure including infection,  bleeding, tissue injury, clip migration, and inadequate sampling. Informed written consent was given. The usual time-out protocol was performed immediately prior to the procedure. Lesion quadrant: Upper inner quadrant Using sterile technique and 1% Lidocaine as local anesthetic, under direct ultrasound visualization, a 12 gauge spring-loaded device was used to perform biopsy of the irregular mass within the left breast at the 9:30 o'clock axisusing a lateral approach. At the conclusion of the procedure, a ribbon shaped tissue marker clip was deployed into the biopsy cavity. Follow-up 2-view mammogram was performed and dictated separately. IMPRESSION: Ultrasound-guided biopsy of the left breast mass at the 9:30 o'clock axis. No apparent complications. Electronically Signed: By: Franki Cabot M.D. On: 12/07/2016 14:23      IMPRESSION/PLAN:  Invasive Ductal Carcinoma of the left breast, ER (100%) positive / PR 0% (NEG) / Her2 (NEG), Ki67 (10%) Grade 2.  It was a pleasure meeting the patient today. We discussed the risks, benefits, and side effects of radiotherapy. We discussed that radiation would take approximately 4 weeks to complete and that I would give the patient a few weeks to heal following surgery before starting treatment planning. We spoke about acute effects including skin irritation and fatigue as well as much less common late effects including lung and heart irritation. We spoke about the latest technology that is used to minimize the risk of late effects for breast cancer patients undergoing radiotherapy. No guarantees of treatment were  given. The patient is enthusiastic about proceeding with treatment. I look forward to participating in the patient's care.  The patient has a single chamber ICD for tachytherapy. I spoke with Dr. Olin Pia staff and her likely need for radiotherapy to the left breast. She is not dependent on the ICD according to my conversation with Dr. Olin Pia staff. If we give radiation to the whole breast it may be a little challenging to spare the ICD  I anticipate this would still be feasible by avoiding some of the breast tissue posteriorly and superiorly.  I reviewed this with my physics team today.  An alternative could be mammosite or possible movement of the ICD to another part of the chest.   Refer back to genetics for up to date panel.  __________________________________________   Eppie Gibson, MD  This document serves as a record of services personally performed by Eppie Gibson, MD. It was created on her behalf by Valeta Harms, a trained medical scribe. The creation of this record is based on the scribe's personal observations and the provider's statements to them. This document has been checked and approved by the attending provider.

## 2016-12-22 NOTE — Telephone Encounter (Signed)
Dr. Isidore Roy calling for general information regarding ICD in relation to radiation therapy of left breast. ICD programmed VVI 40bpm, not dependent. Dr. Isidore Roy reports her mass is about 5 cm in size in upper breast, unsure of device management during radiation therapy. Weeks until first treatment, no definitive start date.  She will have cancer center send device form to [place], she is on vacation next week. She will try to reach out to Dr. Caryl Comes once she is back and come up with a plan, possible device relocation.

## 2016-12-23 ENCOUNTER — Telehealth: Payer: Self-pay | Admitting: *Deleted

## 2016-12-23 NOTE — Telephone Encounter (Signed)
Called patient to inform of genetics appt. On 01-13-17 - arrival time - 8:45 am , pt. to see Mal Misty, spoke with patient and she is aware of this appt.

## 2016-12-24 ENCOUNTER — Telehealth (HOSPITAL_COMMUNITY): Payer: Self-pay | Admitting: *Deleted

## 2016-12-24 NOTE — Telephone Encounter (Signed)
Received note from [facility], pt needs clearance to have left breast lumpectomy under anesthesia.  Will send to Dr Haroldine Laws for review.

## 2016-12-24 NOTE — Telephone Encounter (Signed)
She would be at high risk for cardiac complications with general anesthesia and would have to weight risk/benefits closely.

## 2016-12-25 IMAGING — CT CT HEAD WO/W CM
3 of 6 series · 15 of 47 positions shown, 18 images · IV contrast (iopamidol)
Comparison: CT head without contrast 05/03/2013

CLINICAL DATA: Near syncopal episode. Seizure-like activity.
Personal history of breast cancer.

EXAM:
CT HEAD WITHOUT AND WITH CONTRAST
TECHNIQUE: Contiguous axial images were obtained from the base of the skull
through the vertex without and with intravenous contrast
CONTRAST:  1 [id number]-[id number] IOPAMIDOL (V0FT02-ZUU) INJECTION
61%<Contrast>1 V0FT02-ZUU IOPAMIDOL (V0FT02-ZUU) INJECTION 61%

[Series 3: head wo 2.0 h70h · axial · 0.40mm/px · z∈[-87,+41]mm · 9 of 80 slices shown, 12 images]
[im 8/80  brain]
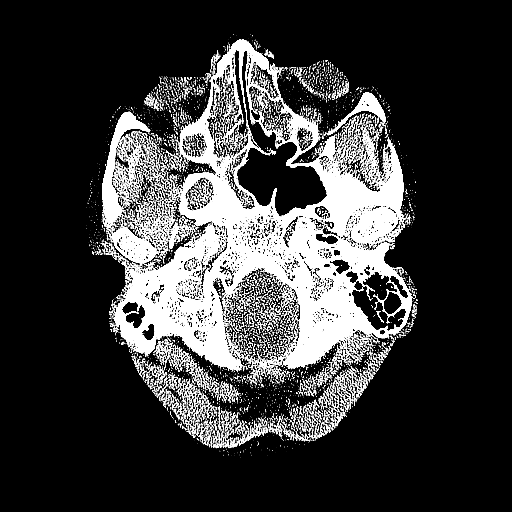
[im 8/80  bone]
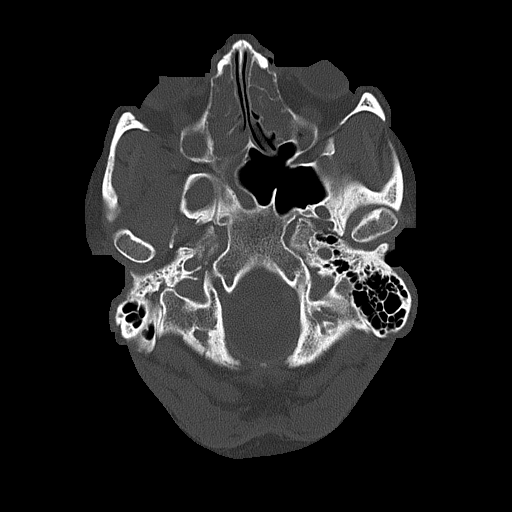
[im 16/80  brain]
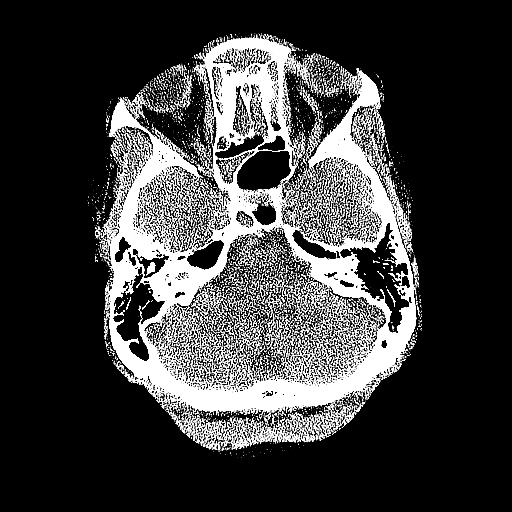
[im 24/80  brain]
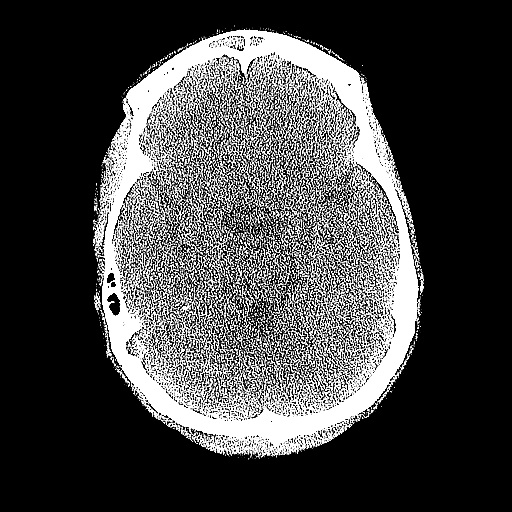
[im 32/80  brain]
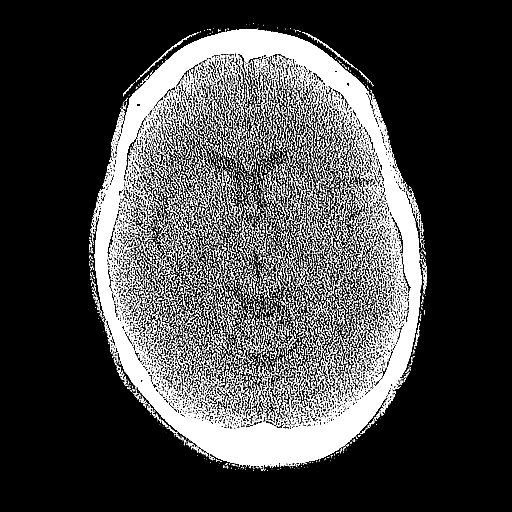
[im 40/80  brain]
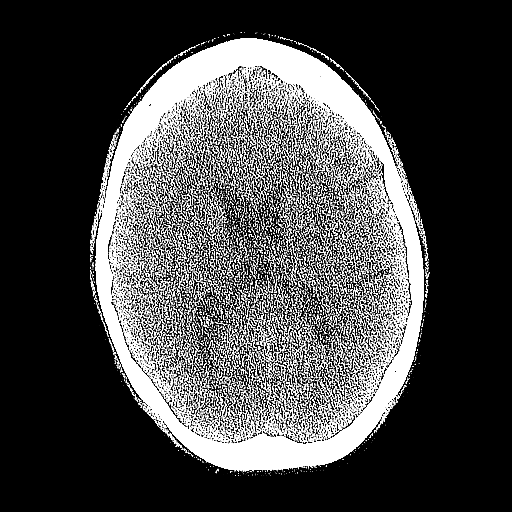
[im 40/80  bone]
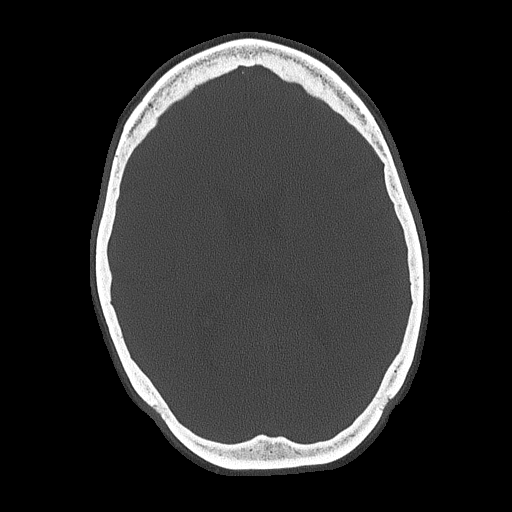
[im 48/80  brain]
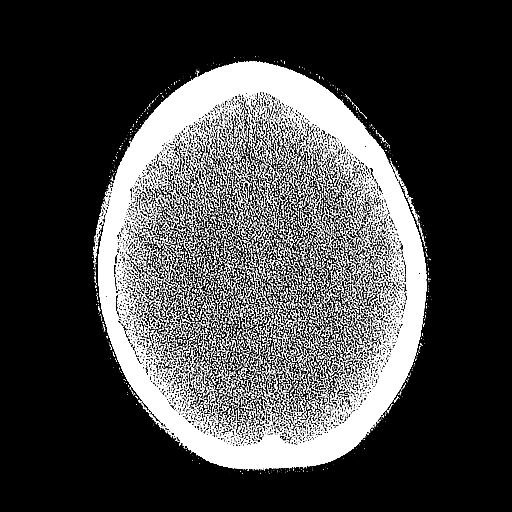
[im 56/80  brain]
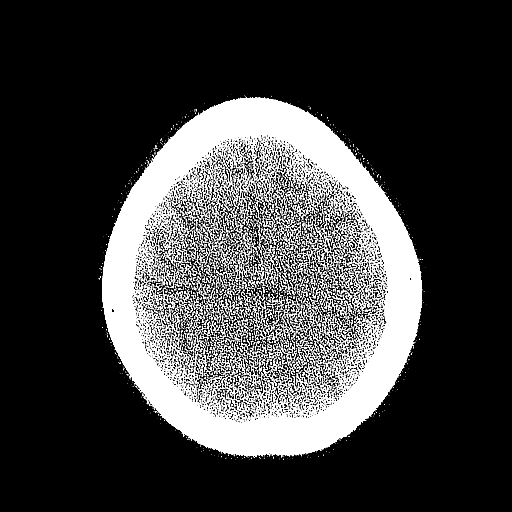
[im 64/80  brain]
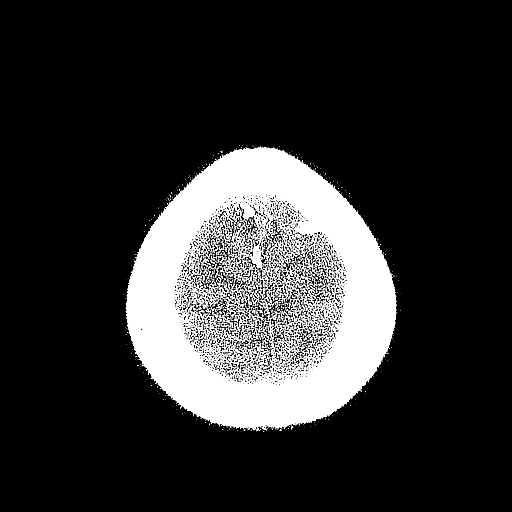
[im 72/80  brain]
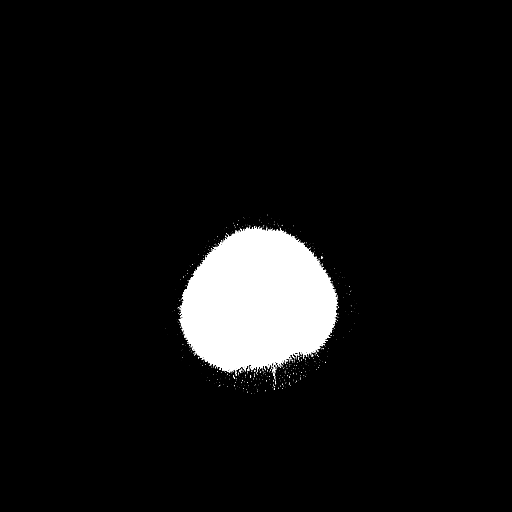
[im 72/80  bone]
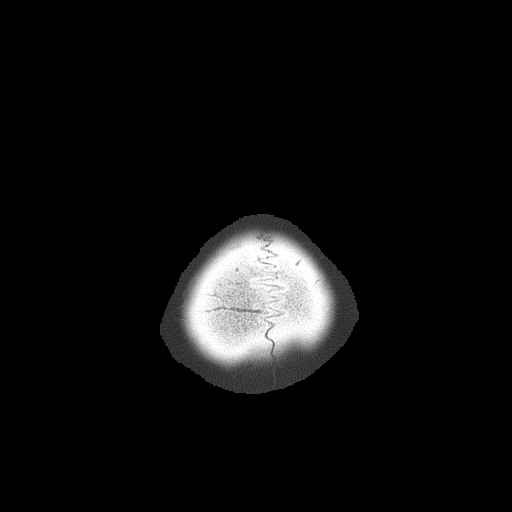

[Series 6: head with 3.0 mpr cor · coronal · 0.29mm/px · 3 of 61 slices shown]
[im 21/61  brain]
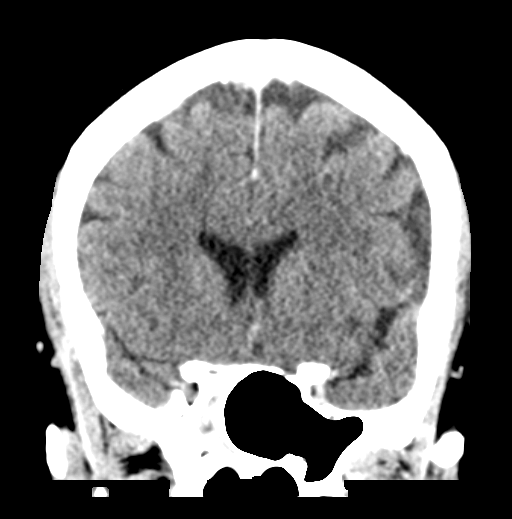
[im 27/61  brain]
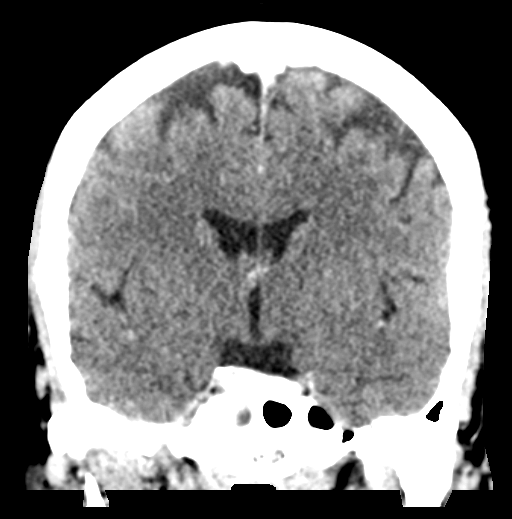
[im 34/61  brain]
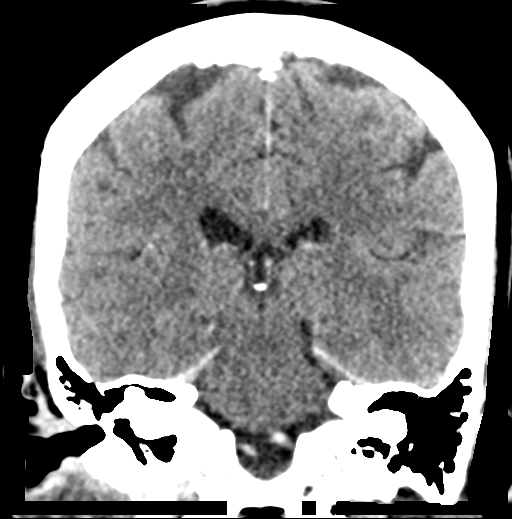

[Series 7: head with 3.0 mpr · sagittal · 0.30mm/px · 3 of 50 slices shown]
[im 17/50  brain]
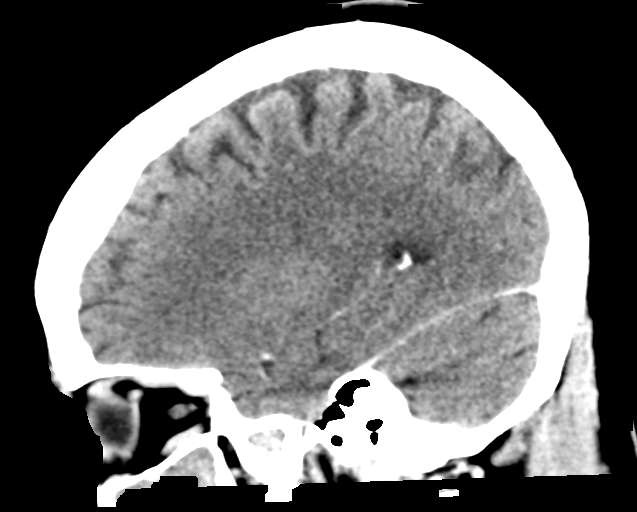
[im 25/50  brain]
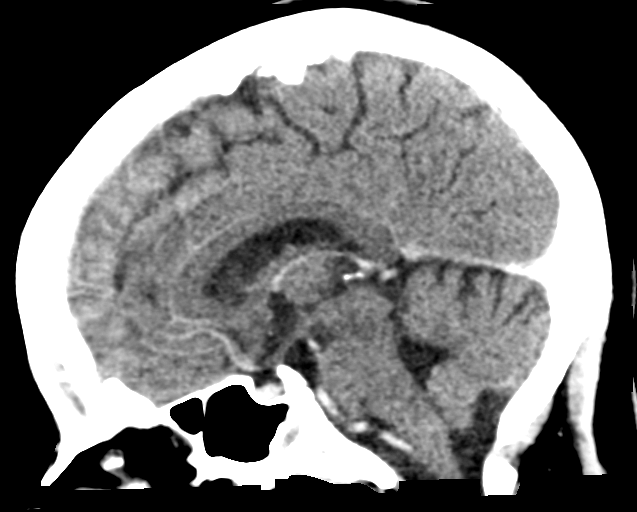
[im 33/50  brain]
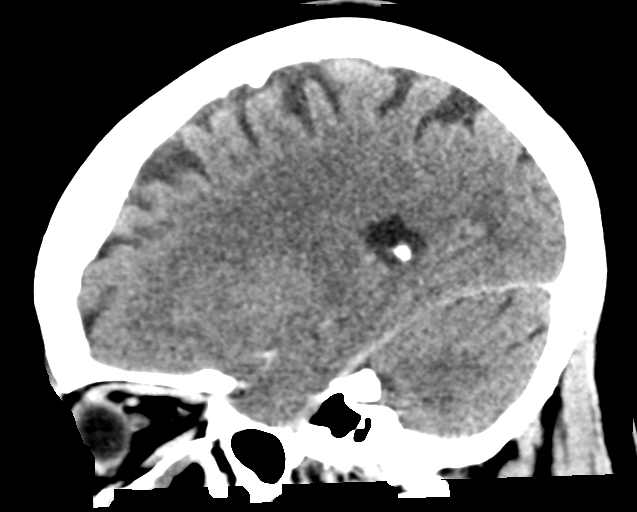

[15 of 47 positions shown; findings below may reference images not displayed]

FINDINGS: Brain: No acute infarct, hemorrhage, or mass lesion is present. The
ventricles are of normal size. No significant extraaxial fluid
collection is present.

The postcontrast images demonstrate no pathologic enhancement.

Vascular: No significant vascular calcifications are present. There
is no hyperdense vessel.

Skull: The calvarium is intact. No focal lytic or blastic lesions
are present.

Sinuses/Orbits: Despite previous maxillary antrostomies, the
maxillary sinuses are near completely opacified. There is diffuse
opacification of ethmoid air cells incomplete opacification of the
frontal sinuses bilaterally. The right sphenoid sinus is opacified.
The left sphenoid sinus and bilateral posterior ethmoid air cells
are the only patent sinuses. The mastoid air cells are patent.

Other:
IMPRESSION: 1. Normal CT appearance the brain without and with contrast.
2. Extensive anterior chronic maxillary, ethmoid, and frontal sinus
disease.
3. Opacification of the right sphenoid sinus. The left sphenoid
sinus is patent.

## 2016-12-27 ENCOUNTER — Ambulatory Visit
Admission: RE | Admit: 2016-12-27 | Discharge: 2016-12-27 | Disposition: A | Discharge status: Home / Self Care | Payer: Medicare Other | Source: Ambulatory Visit | Attending: General Surgery | Admitting: General Surgery

## 2016-12-27 ENCOUNTER — Other Ambulatory Visit: Payer: Self-pay | Admitting: General Surgery

## 2016-12-27 ENCOUNTER — Other Ambulatory Visit (HOSPITAL_COMMUNITY): Payer: Self-pay | Admitting: Adult Health

## 2016-12-27 ENCOUNTER — Other Ambulatory Visit: Payer: Self-pay

## 2016-12-27 DIAGNOSIS — C50212 Malignant neoplasm of upper-inner quadrant of left female breast: Secondary | ICD-10-CM

## 2016-12-27 DIAGNOSIS — N6314 Unspecified lump in the right breast, lower inner quadrant: Secondary | ICD-10-CM | POA: Diagnosis not present

## 2016-12-27 DIAGNOSIS — N641 Fat necrosis of breast: Secondary | ICD-10-CM | POA: Diagnosis not present

## 2016-12-27 NOTE — Telephone Encounter (Signed)
Note faxed to [facility] at [phone]

## 2016-12-28 ENCOUNTER — Other Ambulatory Visit: Payer: Self-pay | Admitting: Oncology

## 2016-12-28 ENCOUNTER — Telehealth: Payer: Self-pay | Admitting: Medical Oncology

## 2016-12-28 ENCOUNTER — Encounter: Payer: Self-pay | Admitting: Oncology

## 2016-12-28 ENCOUNTER — Telehealth: Payer: Self-pay | Admitting: Oncology

## 2016-12-28 DIAGNOSIS — C50811 Malignant neoplasm of overlapping sites of right female breast: Secondary | ICD-10-CM | POA: Insufficient documentation

## 2016-12-28 DIAGNOSIS — C50212 Malignant neoplasm of upper-inner quadrant of left female breast: Secondary | ICD-10-CM

## 2016-12-28 DIAGNOSIS — Z170 Estrogen receptor positive status [ER+]: Secondary | ICD-10-CM | POA: Insufficient documentation

## 2016-12-28 NOTE — Progress Notes (Signed)
No show

## 2016-12-28 NOTE — Progress Notes (Signed)
[place]  [phone] Fax:([phone]   MS Minichiello CANCELLED HER APPOINTMENT 06/30/2016 AT THE LAST MINUTE.  ID: [name] DOB: 1970/09/16  MR#: [id number]  MGQ#:[id number]  Patient Care Team: Golden Circle, FNP as PCP - General (Family Medicine) Bensimhon, Shaune Pascal, MD as Consulting Physician (Cardiology) Magrinat, Virgie Dad, MD as Consulting Physician (Oncology) Deboraha Sprang, MD as Consulting Physician (Cardiology) Dohmeier, Asencion Partridge, MD as Consulting Physician (Neurology) Chesley Mires, MD as Consulting Physician (Pulmonary Disease) Fanny Skates, MD as Consulting Physician (General Surgery) Eppie Gibson, MD as Attending Physician (Radiation Oncology) PCP: Golden Circle, FNP GYN: Ena Dawley SU:  OTHER MD: Glori Bickers, Arloa Koh, Jolyn Nap, Asencion Partridge Dohmeier  CHIEF COMPLAINT: Contralateral estrogen receptor positive breast cancer  CURRENT TREATMENT: Awaiting definitive treatment   BREAST CANCER HISTORY:  from Dr. Collier Salina Rubin's original intake note dated 08/31/2006   "This woman has been in good health.  She had a screening mammogram done via mobile mammogram unit.  A baseline mammogram did show a lesion or suspicious pigmented area at 9 o'clock in the right breast.  [facility]  A digital right diagnostic mammogram and ultrasound was done on 08/01/2006.  Physical examination did palpate a 1.5 cm mass at 9 o'clock position 7 cm from the nipple.  Ultrasound showed this to be 1.5 x 1.3 x 1.0 cm.  Biopsy was recommended.  Biopsy on 08/01/2006 showed invasive adenocarcinoma associated with DCIS, which appeared to be high-grade.  ER and PR were positive at 99% and 98% respectively.  Proliferative index was 16%.  HER-2/neu was 1+.  The patient underwent a lumpectomy and sentinel lymph node evaluation on 08/22/2006.  Prior to that, a MRI of both breasts was done on 08/10/2006, which documented a  2.4 cm spiculated mass in the breast.  No other abnormalities were seen.  The lumpectomy and sentinel lymph node evaluation on 08/22/2006 showed a 2.6 cm, grade 3/3 invasive ductal cancer, lymphovascular invasion was seen focally.  Surgical margins were clear.  A total of five lymph nodes were removed at the time of sentinel node, one of which was involved with malignancy.  The patient has had an unremarkable postoperative course."  Her subsequent history is detailed below   INTERVAL HISTORY: Elmyra Ricks has a history of right-sided breast cancer dating back 10 years. She missed her last 2 appointments with me as "no shows". The last time I saw this patient was in 2016.  More recently she noted a mass in her right breast. She underwent bilateral diagnostic mammography with tomography at the [facility] 12/07/2016 found a breast density to be category B. In the right breast there were only postsurgical changes and no suspicious findings. Ultrasound of the right breast showed an area consistent with a 9 fat necrosis in the 4:00 axis 5 cm from the nipple, measuring 1.8 cm. Biopsy of this area 12/27/2016 confirmed fat necrosis, with no evidence of malignancy ([id number]).  In the left breast however there was an irregular mass associated with architectural distortion, altogether measuring approximately 0.7 centimeters. Ultrasound of the left breast confirmed an irregular hypoechoic mass at the 9:30 o'clock axis 6 cm from the nipple measuring 0.8 cm. The left axilla was sonographically benign.  Biopsy of the left breast mass in question 12/07/2016 found ([id number]) an invasive ductal carcinoma, grade 2, estrogen receptor 100% positive, with strong staining intensity, progesterone receptor negative, with an MIB-1 of 10%, and no HER-2 amplification,  the signals ratio being 1.24 and the number per cell 2.05.  This case was discussed at the multidisciplinary breast cancer conference 12/22/2016. At that point a  preliminary plan was suggested: Breast conserving surgery with sentinel lymph node sampling followed by radiation versus consideration of bilateral mastectomies, the concern of course being the patient's congestive heart failure. Anti-estrogens would follow at the completion of local treatment. A full genetics panel was also suggested.  The patient's subsequent history is as detailed below.  REVIEW OF SYSTEMS:     PAST MEDICAL HISTORY: Past Medical History:  Diagnosis Date  . Acne   . Anemia 1980's   [id number]  . Anxiety   . Arthritis   . 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 Blood transfusion 1980's   1987 or 1988  . Breast calcifications    right breast  . Breast cancer (HCC)    right;  . 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 Cardiac MRI 05/2011 EF 32%. ICD implantation 10/2011 ([employer])  . 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 Conversion disorder   . 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 Depression   . Endometriosis   . Fibromyalgia   . GERD (gastroesophageal reflux disease)   . Hepatomegaly   . History of stomach ulcers   . Hyperlipidemia   . Hypertension    c/b orthostatic hypotention  . ICD (implantable cardiac defibrillator) in place   . Insomnia   . Interstitial cystitis   . Left eye injury 12/2014  . 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   . Nonischemic cardiomyopathy ([place])    related to chemo; EF 30% 05/2011  . Obesity   . Palpitation    normal sinus rhythm only on 21 day heart monitor  . Panic attacks   . Peripheral neuropathy    chemo- induced  . Pneumonia 2000's   "once"  . Seasonal allergies   . Type II diabetes mellitus ([place])     PAST SURGICAL HISTORY: Past Surgical History:  Procedure Laterality Date  . ABDOMINAL HYSTERECTOMY     partial  . BREAST EXCISIONAL  BIOPSY     right benign 2012  . BREAST LUMPECTOMY  2008; 2013   right radiation  . CARDIAC DEFIBRILLATOR PLACEMENT  11/03/11  . CESAREAN SECTION    . CHOLECYSTECTOMY  ~ 2009  . COLONOSCOPY    . IMPLANTABLE CARDIOVERTER DEFIBRILLATOR IMPLANT N/A 11/03/2011   Procedure: IMPLANTABLE CARDIOVERTER DEFIBRILLATOR IMPLANT;  Surgeon: Deboraha Sprang, MD; MDT   . LAPAROSCOPIC ENDOMETRIOSIS FULGURATION  1990's  . NASAL SINUS SURGERY    . [place] REMOVAL  2010?   left chest; placed in 2008  . TONSILLECTOMY  1980's  . TUBAL LIGATION  1990's    FAMILY HISTORY Family History  Problem Relation Age of Onset  . Heart disease Mother   . Arthritis Mother   . Hypertension Mother   . Stroke Mother   . Fibromyalgia Mother   . Coronary artery disease Unknown   . Heart attack Unknown   . Heart disease Maternal Uncle   . Colon cancer Paternal Uncle   . Heart disease Maternal Grandmother   . Ovarian cancer Paternal Grandmother   . Fibromyalgia Sister    the patient's father is still living, at age 5. He has a history of Crohn's disease. The patient's mother is living, age 8. She has a  history of fibromyalgia. The patient has 3 half-brothers, one sister. The patient's father's mother had either ovarian cancer or cervical cancer. The patient was tested in June 2008 for mutations in the BRCA1 and 2 genes. Complete sequencing found no mutations   GYNECOLOGIC HISTORY:  No LMP recorded. Patient has had a hysterectomy. menarche age 22, first live birth age 65. She is GX P1. She underwent simple hysterectomy, no salpingo-oophorectomy in 2007.   SOCIAL HISTORY:   she used to work for a [employer] in [employer]. Her husband of 23 years, [name], in addition to his regular job is Theme park manager of their [employer]. There daughter,Maya, is currently 13. The patient attends the [employer] [facility] locally.     ADVANCED DIRECTIVES: Not in place. The patient's husband is her healthcare power of attorney   HEALTH  MAINTENANCE: Social History  Substance Use Topics  . Smoking status: Never Smoker  . Smokeless tobacco: Never Used  . Alcohol use Yes     Comment: social drinker      Colonoscopy:  PAP:  Bone density:  Lipid panel:  Allergies  Allergen Reactions  . Ace Inhibitors Other (See Comments)    hyperkalemia  . Losartan Other (See Comments)    hyperkalemia  . Reglan [Metoclopramide Hcl] Anxiety  . Adhesive [Tape] Itching and Rash    Current Outpatient Prescriptions  Medication Sig Dispense Refill  . acetaminophen (TYLENOL) 325 MG tablet Take 2 tablets (650 mg total) by mouth every 4 (four) hours as needed for headache or mild pain.    Marland Kitchen albuterol (PROVENTIL HFA;VENTOLIN HFA) 108 (90 Base) MCG/ACT inhaler Inhale 1-2 puffs into the lungs daily.    . baclofen (LIORESAL) 10 MG tablet TAKE 1 TABLET BY MOUTH 2 (TWO) TIMES DAILY AS NEEDED FOR MUSCLE SPASMS. 60 tablet 0  . BLACK CURRANT SEED OIL PO Take 1 tablet by mouth.    . carvedilol (COREG) 25 MG tablet Take 0.5 tablets (12.5 mg total) by mouth 2 (two) times daily with a meal. 60 tablet 6  . Cholecalciferol (VITAMIN D3) 5000 UNITS CAPS Take 1 capsule by mouth daily.     . Coenzyme Q10 (CO Q-10) 100 MG CAPS Take 100 mg by mouth daily.    . Cyanocobalamin 2500 MCG SUBL Place 2,500 mcg under the tongue daily.    . digoxin (LANOXIN) 0.125 MG tablet Take 0.5 tablets (0.0625 mg total) by mouth daily. 30 tablet 2  . divalproex (DEPAKOTE ER) 250 MG 24 hr tablet (07/18/2016) TAKE 2 TABLETS IN THE MORNING AND 2 TABLETS AT BEDTIME. 120 tablet 3  . DULoxetine (CYMBALTA) 30 MG capsule TAKE 1 CAPSULE EVERY DAY 30 capsule 5  . ferrous sulfate 325 (65 FE) MG tablet Take 325 mg by mouth daily with breakfast.    . GLUCOSAMINE-CHONDROITIN PO Take 1,200 mg by mouth daily.    . hydrOXYzine (ATARAX/VISTARIL) 25 MG tablet Take 25 mg by mouth at bedtime as needed for itching (DOESN'T TAKE WITH TRAZODONE).    Marland Kitchen ivabradine (CORLANOR) 5 MG TABS tablet Take 1 tablet  (5 mg total) by mouth 2 (two) times daily with a meal. 180 tablet 3  . loratadine (CLARITIN) 10 MG tablet Take 10 mg by mouth daily.    Marland Kitchen losartan (COZAAR) 25 MG tablet Take 1 tablet (25 mg total) by mouth daily. 90 tablet 3  . metolazone (ZAROXOLYN) 2.5 MG tablet Take 1 tablet (2.5 mg total) by mouth daily as needed (edema). 15 tablet 0  . Milk Thistle 150 MG CAPS  Take 450 mg by mouth daily.     . Multiple Vitamin (MULTIVITAMIN WITH MINERALS) TABS Take 1 tablet by mouth daily. Reported on 10/13/2015    . omeprazole (PRILOSEC) 40 MG capsule Take 1 capsule (40 mg total) by mouth 2 (two) times daily before a meal. Breakfast and supper 60 capsule 9  . ondansetron (ZOFRAN) 4 MG tablet Take 1 tablet (4 mg total) by mouth every 8 (eight) hours as needed for nausea or vomiting. 20 tablet 3  . PATADAY 0.2 % SOLN Place 1 drop into both eyes daily.     . pentosan polysulfate (ELMIRON) 100 MG capsule Take 200 mg by mouth daily.    . potassium chloride Roy (K-DUR,KLOR-CON) 20 MEQ tablet Take 2 tablets (40 mEq total) by mouth daily. 180 tablet 3  . spironolactone (ALDACTONE) 25 MG tablet TAKE 0.5 TABLETS (12.5 MG TOTAL) BY MOUTH DAILY. 45 tablet 3  . torsemide (DEMADEX) 20 MG tablet TAKE 2 TABLETS (40 MG TOTAL) BY MOUTH DAILY. 60 tablet 6  . traZODone (DESYREL) 50 MG tablet Take 50 mg by mouth as needed. FOR SLEEP     No current facility-administered medications for this visit.     OBJECTIVE: Young African-American woman in no acute distress There were no vitals filed for this visit.   There is no height or weight on file to calculate BMI.    ECOG FS:2 - Symptomatic, <50% confined to bed   LAB RESULTS:  CMP     Component Value Date/Time   NA 135 10/28/2016 0851   NA 139 07/13/2016 1606   NA 141 09/19/2014 1133   K 3.7 10/28/2016 0851   K 3.5 09/19/2014 1133   CL 95 (L) 10/28/2016 0851   CO2 29 10/28/2016 0851   CO2 23 09/19/2014 1133   GLUCOSE 122 (H) 10/28/2016 0851   GLUCOSE 103 09/19/2014 1133    BUN 12 10/28/2016 0851   BUN 17 07/13/2016 1606   BUN 11.6 09/19/2014 1133   CREATININE 1.09 (H) 10/28/2016 0851   CREATININE 0.9 09/19/2014 1133   CALCIUM 9.1 10/28/2016 0851   CALCIUM 8.6 09/19/2014 1133   PROT 8.2 07/13/2016 1606   PROT 7.2 09/19/2014 1133   ALBUMIN 4.4 07/13/2016 1606   ALBUMIN 3.4 (L) 09/19/2014 1133   AST 21 07/13/2016 1606   AST 21 09/19/2014 1133   ALT 12 07/13/2016 1606   ALT 14 09/19/2014 1133   ALKPHOS 99 07/13/2016 1606   ALKPHOS 87 09/19/2014 1133   BILITOT 0.8 07/13/2016 1606   BILITOT 1.62 (H) 09/19/2014 1133   GFRNONAA 60 (L) 10/28/2016 0851   GFRAA >60 10/28/2016 0851    INo results found for: SPEP, UPEP  Lab Results  Component Value Date   WBC 5.6 07/13/2016   NEUTROABS 3.0 07/13/2016   HGB 12.3 07/13/2016   HCT 36.3 07/13/2016   MCV 95 07/13/2016   PLT 181 07/13/2016      Chemistry      Component Value Date/Time   NA 135 10/28/2016 0851   NA 139 07/13/2016 1606   NA 141 09/19/2014 1133   K 3.7 10/28/2016 0851   K 3.5 09/19/2014 1133   CL 95 (L) 10/28/2016 0851   CO2 29 10/28/2016 0851   CO2 23 09/19/2014 1133   BUN 12 10/28/2016 0851   BUN 17 07/13/2016 1606   BUN 11.6 09/19/2014 1133   CREATININE 1.09 (H) 10/28/2016 0851   CREATININE 0.9 09/19/2014 1133   GLU 89 02/18/2015  Component Value Date/Time   CALCIUM 9.1 10/28/2016 0851   CALCIUM 8.6 09/19/2014 1133   ALKPHOS 99 07/13/2016 1606   ALKPHOS 87 09/19/2014 1133   AST 21 07/13/2016 1606   AST 21 09/19/2014 1133   ALT 12 07/13/2016 1606   ALT 14 09/19/2014 1133   BILITOT 0.8 07/13/2016 1606   BILITOT 1.62 (H) 09/19/2014 1133       Lab Results  Component Value Date   LABCA2 24 08/19/2011    No components found for: [id number]  No results for input(s): INR in the last 168 hours.  Urinalysis    Component Value Date/Time   COLORURINE AMBER (A) 02/18/2016 1741   APPEARANCEUR CLEAR 02/18/2016 1741   LABSPEC 1.018 02/18/2016 1741   PHURINE 5.5  02/18/2016 1741   GLUCOSEU NEGATIVE 02/18/2016 1741   HGBUR NEGATIVE 02/18/2016 1741   BILIRUBINUR SMALL (A) 02/18/2016 1741   KETONESUR 15 (A) 02/18/2016 1741   PROTEINUR NEGATIVE 02/18/2016 1741   UROBILINOGEN 0.2 05/02/2008 1222   NITRITE NEGATIVE 02/18/2016 1741   LEUKOCYTESUR NEGATIVE 02/18/2016 1741    STUDIES: US Breast Ltd Uni Left Inc Axilla  Result Date: 12/07/2016 CLINICAL DATA:  History of right breast cancer in 2012 status post lumpectomy and radiation therapy. Patient describes a new palpable lump within the inner right breast. EXAM: 2D DIGITAL DIAGNOSTIC BILATERAL MAMMOGRAM WITH CAD AND ADJUNCT TOMO ULTRASOUND BILATERAL BREAST COMPARISON:  Previous exam(s). ACR Breast Density Category b: There are scattered areas of fibroglandular density. FINDINGS: Right breast: There are stable postsurgical changes within the right breast. There are no new dominant masses, suspicious calcifications or secondary signs of malignancy within the right breast. Specifically, there is no mammographic abnormality within the inner right breast corresponding to the area of clinical concern. Left breast: There is an irregular mass within the inner left breast, at middle to posterior depth, with associated architectural distortion, measuring approximately 7 mm greatest dimension. Mammographic images were processed with CAD. Right breast: Targeted ultrasound is performed, showing a predominantly hyperechoic area in the right breast at the 4 o'clock axis, 5 cm from the nipple, at superficial depth, measuring 1.8 x 1.2 x 1.7 cm, with tiny cystic foci internally, avascular, most suggestive of benign fat necrosis during real-time ultrasound evaluation. Left breast: Targeted ultrasound is performed, showing an irregular hypoechoic mass in the left breast at the 9:30 o'clock axis, 6 cm from the nipple, measuring 8 mm, corresponding to the mammographic finding. Left axilla was evaluated with ultrasound showing no enlarged  or morphologically abnormal lymph nodes. IMPRESSION: 1. Suspicious mass within the left breast at the 9:30 o'clock axis, 6 cm from the nipple, measuring 8 mm, corresponding to an incidental mammographic finding. Ultrasound-guided biopsy core is recommended. 2. Probably benign fat necrosis within the right breast at the 4 o'clock axis, 5 cm from the nipple, measuring 1.8 x 1.2 x 1.7 cm. Recommend follow-up diagnostic exam in 3 months to ensure resolution. RECOMMENDATION: 1. Ultrasound-guided biopsy for the left breast mass at the 9:30 o'clock axis, 6 cm from the nipple, measuring 8 mm. 2. Follow-up right breast diagnostic mammogram and ultrasound in 3 months to ensure resolution of the probably benign fat necrosis. Ultrasound-guided core biopsy for the left breast mass is scheduled for later today. I have discussed the findings and recommendations with the patient. Results were also provided in writing at the conclusion of the visit. If applicable, a reminder letter will be sent to the patient regarding the next appointment. BI-RADS CATEGORY  4: Suspicious.  Electronically Signed   By: Franki Cabot M.D.   On: 12/07/2016 12:06   US Breast Ltd Uni Right Inc Axilla  Result Date: 12/07/2016 CLINICAL DATA:  History of right breast cancer in 2012 status post lumpectomy and radiation therapy. Patient describes a new palpable lump within the inner right breast. EXAM: 2D DIGITAL DIAGNOSTIC BILATERAL MAMMOGRAM WITH CAD AND ADJUNCT TOMO ULTRASOUND BILATERAL BREAST COMPARISON:  Previous exam(s). ACR Breast Density Category b: There are scattered areas of fibroglandular density. FINDINGS: Right breast: There are stable postsurgical changes within the right breast. There are no new dominant masses, suspicious calcifications or secondary signs of malignancy within the right breast. Specifically, there is no mammographic abnormality within the inner right breast corresponding to the area of clinical concern. Left breast: There  is an irregular mass within the inner left breast, at middle to posterior depth, with associated architectural distortion, measuring approximately 7 mm greatest dimension. Mammographic images were processed with CAD. Right breast: Targeted ultrasound is performed, showing a predominantly hyperechoic area in the right breast at the 4 o'clock axis, 5 cm from the nipple, at superficial depth, measuring 1.8 x 1.2 x 1.7 cm, with tiny cystic foci internally, avascular, most suggestive of benign fat necrosis during real-time ultrasound evaluation. Left breast: Targeted ultrasound is performed, showing an irregular hypoechoic mass in the left breast at the 9:30 o'clock axis, 6 cm from the nipple, measuring 8 mm, corresponding to the mammographic finding. Left axilla was evaluated with ultrasound showing no enlarged or morphologically abnormal lymph nodes. IMPRESSION: 1. Suspicious mass within the left breast at the 9:30 o'clock axis, 6 cm from the nipple, measuring 8 mm, corresponding to an incidental mammographic finding. Ultrasound-guided biopsy core is recommended. 2. Probably benign fat necrosis within the right breast at the 4 o'clock axis, 5 cm from the nipple, measuring 1.8 x 1.2 x 1.7 cm. Recommend follow-up diagnostic exam in 3 months to ensure resolution. RECOMMENDATION: 1. Ultrasound-guided biopsy for the left breast mass at the 9:30 o'clock axis, 6 cm from the nipple, measuring 8 mm. 2. Follow-up right breast diagnostic mammogram and ultrasound in 3 months to ensure resolution of the probably benign fat necrosis. Ultrasound-guided core biopsy for the left breast mass is scheduled for later today. I have discussed the findings and recommendations with the patient. Results were also provided in writing at the conclusion of the visit. If applicable, a reminder letter will be sent to the patient regarding the next appointment. BI-RADS CATEGORY  4: Suspicious. Electronically Signed   By: Franki Cabot M.D.   On:  12/07/2016 12:06   Mm Diag Breast Tomo Bilateral  Result Date: 12/07/2016 CLINICAL DATA:  History of right breast cancer in 2012 status post lumpectomy and radiation therapy. Patient describes a new palpable lump within the inner right breast. EXAM: 2D DIGITAL DIAGNOSTIC BILATERAL MAMMOGRAM WITH CAD AND ADJUNCT TOMO ULTRASOUND BILATERAL BREAST COMPARISON:  Previous exam(s). ACR Breast Density Category b: There are scattered areas of fibroglandular density. FINDINGS: Right breast: There are stable postsurgical changes within the right breast. There are no new dominant masses, suspicious calcifications or secondary signs of malignancy within the right breast. Specifically, there is no mammographic abnormality within the inner right breast corresponding to the area of clinical concern. Left breast: There is an irregular mass within the inner left breast, at middle to posterior depth, with associated architectural distortion, measuring approximately 7 mm greatest dimension. Mammographic images were processed with CAD. Right breast: Targeted ultrasound is performed, showing a predominantly  hyperechoic area in the right breast at the 4 o'clock axis, 5 cm from the nipple, at superficial depth, measuring 1.8 x 1.2 x 1.7 cm, with tiny cystic foci internally, avascular, most suggestive of benign fat necrosis during real-time ultrasound evaluation. Left breast: Targeted ultrasound is performed, showing an irregular hypoechoic mass in the left breast at the 9:30 o'clock axis, 6 cm from the nipple, measuring 8 mm, corresponding to the mammographic finding. Left axilla was evaluated with ultrasound showing no enlarged or morphologically abnormal lymph nodes. IMPRESSION: 1. Suspicious mass within the left breast at the 9:30 o'clock axis, 6 cm from the nipple, measuring 8 mm, corresponding to an incidental mammographic finding. Ultrasound-guided biopsy core is recommended. 2. Probably benign fat necrosis within the right breast  at the 4 o'clock axis, 5 cm from the nipple, measuring 1.8 x 1.2 x 1.7 cm. Recommend follow-up diagnostic exam in 3 months to ensure resolution. RECOMMENDATION: 1. Ultrasound-guided biopsy for the left breast mass at the 9:30 o'clock axis, 6 cm from the nipple, measuring 8 mm. 2. Follow-up right breast diagnostic mammogram and ultrasound in 3 months to ensure resolution of the probably benign fat necrosis. Ultrasound-guided core biopsy for the left breast mass is scheduled for later today. I have discussed the findings and recommendations with the patient. Results were also provided in writing at the conclusion of the visit. If applicable, a reminder letter will be sent to the patient regarding the next appointment. BI-RADS CATEGORY  4: Suspicious. Electronically Signed   By: Franki Cabot M.D.   On: 12/07/2016 12:06   Mm Clip Placement Left  Result Date: 12/07/2016 CLINICAL DATA:  Status post ultrasound-guided biopsy earlier today for a left breast mass. EXAM: DIAGNOSTIC LEFT MAMMOGRAM POST ULTRASOUND BIOPSY COMPARISON:  Previous exam(s). FINDINGS: Mammographic images were obtained following ultrasound guided biopsy of the left breast mass at the 9:30 o'clock axis. At the conclusion of the procedure, a ribbon shaped tissue marker was placed at the biopsy site. Biopsy clip is well-positioned at the site of the targeted mass, corresponding to the original mammographic finding. IMPRESSION: Ribbon shaped biopsy clip is well-positioned at the site of the targeted mass within the inner left breast, corresponding to the original mammographic finding. Final Assessment: Post Procedure Mammograms for Marker Placement Electronically Signed   By: Franki Cabot M.D.   On: 12/07/2016 14:29   Mm Clip Placement Right  Result Date: 12/27/2016 CLINICAL DATA:  Status post ultrasound-guided core biopsy of hyperechoic palpable mass in the lower inner quadrant of the right breast. EXAM: DIAGNOSTIC RIGHT MAMMOGRAM POST ULTRASOUND  BIOPSY COMPARISON:  Previous exam(s). FINDINGS: Mammographic images were obtained following ultrasound guided biopsy of mass in the 4 o'clock location of the right breast. A ribbon shaped clip is identified in the lower inner quadrant as expected. IMPRESSION: Tissue marker clip in the expected location following biopsy. Final Assessment: Post Procedure Mammograms for Marker Placement Electronically Signed   By: Nolon Nations M.D.   On: 12/27/2016 13:58   [place] Lt Breast Bx W Loc Dev 1st Lesion Img Bx Spec US Guide  Addendum Date: 12/08/2016   ADDENDUM REPORT: 12/08/2016 14:39 ADDENDUM: Pathology revealed GRADE II INVASIVE DUCTAL CARCINOMA, DUCTAL CARCINOMA IN SITU of the Left breast, 9:30 o'clock. This was found to be concordant by Dr. Franki Cabot. Pathology results were discussed with the patient by telephone. The patient reported doing well after the biopsy with tenderness at the site. Post biopsy instructions and care were reviewed and questions were answered. The  patient was encouraged to call [facility] for any additional concerns. Surgical consultation has been arranged with Dr. Fanny Skates at [facility] on December 09, 2016. Pathology results reported by Terie Purser, RN on 12/08/2016. Electronically Signed   By: Franki Cabot M.D.   On: 12/08/2016 14:39   Result Date: 12/08/2016 CLINICAL DATA:  Patient with a suspicious left breast mass found on diagnostic examination earlier today presents now for ultrasound-guided core biopsy. History of right breast cancer status post lumpectomy. EXAM: ULTRASOUND GUIDED LEFT BREAST CORE NEEDLE BIOPSY COMPARISON:  Previous exam(s). PROCEDURE: I met with the patient and we discussed the procedure of ultrasound-guided biopsy, including benefits and alternatives. We discussed the high likelihood of a successful procedure. We discussed the risks of the procedure including infection, bleeding, tissue injury, clip migration, and  inadequate sampling. Informed written consent was given. The usual time-out protocol was performed immediately prior to the procedure. Lesion quadrant: Upper inner quadrant Using sterile technique and 1% Lidocaine as local anesthetic, under direct ultrasound visualization, a 12 gauge spring-loaded device was used to perform biopsy of the irregular mass within the left breast at the 9:30 o'clock axisusing a lateral approach. At the conclusion of the procedure, a ribbon shaped tissue marker clip was deployed into the biopsy cavity. Follow-up 2-view mammogram was performed and dictated separately. IMPRESSION: Ultrasound-guided biopsy of the left breast mass at the 9:30 o'clock axis. No apparent complications. Electronically Signed: By: Franki Cabot M.D. On: 12/07/2016 14:23   [place] Rt Breast Bx W Loc Dev 1st Lesion Img Bx Spec US Guide  Addendum Date: 12/28/2016   ADDENDUM REPORT: 12/28/2016 14:12 ADDENDUM: Pathology revealed FAT NECROSIS of the Right breast, 4:00 o'clock. This was found to be concordant by Dr. Nolon Nations. Pathology results were discussed with the patient by telephone. The patient reported doing well after the biopsy with tenderness at the site. Post biopsy instructions and care were reviewed and questions were answered. The patient was encouraged to call [facility] for any additional concerns. The patient has a recent diagnosis of left breast cancer and should follow her outlined treatment plan. Pathology results reported by Terie Purser, RN on 12/28/2016. Electronically Signed   By: Nolon Nations M.D.   On: 12/28/2016 14:12   Result Date: 12/28/2016 CLINICAL DATA:  Patient presents for ultrasound-guided core biopsy of palpable mass in the right breast 4 o'clock location. History of right lumpectomy in 2008. 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 EXAM: ULTRASOUND GUIDED RIGHT BREAST CORE NEEDLE BIOPSY COMPARISON:  Previous  exam(s). FINDINGS: I met with the patient and we discussed the procedure of ultrasound-guided biopsy, including benefits and alternatives. We discussed the high likelihood of a successful procedure. We discussed the risks of the procedure, including infection, bleeding, tissue injury, clip migration, and inadequate sampling. Informed written consent was given. The usual time-out protocol was performed immediately prior to the procedure. Lesion quadrant: Lower inner quadrant right breast Using sterile technique and 1% Lidocaine as local anesthetic, under direct ultrasound visualization, a 12 gauge spring-loaded device was used to perform biopsy of palpable area of hyperechoic breast parenchyma in the 4 o'clock location of the right breast using a inferior approach. At the conclusion of the procedure a ribbon shaped tissue marker clip was deployed into the biopsy cavity. Follow up 2 view mammogram was performed and dictated separately. IMPRESSION: Ultrasound guided biopsy of right breast mass. No apparent complications. Electronically  Signed: By: Nolon Nations M.D. On: 12/27/2016 13:50     ASSESSMENT: 46 y.o.  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, progesterone receptor 98% positive, with an MIB-1 of 16% and no HER-2 amplification   (a) biopsy of a palpable area in the right breast 4:00 position 12/27/2016 showed only fat necrosis  (2) completion axillary dissection 09/07/2006 showed no additional positive lymph nodes (total of 17 lymph nodes sampled, one positive)  (3) adjuvant chemotherapy followed [facility] 10/12/2001, arm D   (a) started 10/21/2006 and consisted of cyclophosphamide and doxorubicin given in dose dense fashion 4, completed 12/02/2006 (total doxorubicin dose 240 mg/m), followed by   (b) weekly paclitaxel 12 together with  either bevacizumab or placebo given every 3 weeks, completed 03/03/2007    (c) bevacizumab was to have been continued, but protocol-mandated echocardiogram 03/06/2007 showed the patient's previously normal left ventricular ejection fraction to have dropped to 35%.  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  (11) definitive surgery pending  (12) genetics testing scheduled for 01/13/2017  (13) adjuvant radiation to follow as appropriate  (14) anti-estrogens to follow with the completion of local treatment.   PLAN:MAGRINAT,GUSTAV C, MD   12/28/2016 7:39 PM

## 2016-12-28 NOTE — Telephone Encounter (Signed)
Cancel appt today per pt request. She had back to back seizures today and is resting now . I sent new scheduel request to r/s

## 2016-12-28 NOTE — Telephone Encounter (Signed)
sw pt and husband to confirm r/s appt 7/17 to 7/18 due to mother called -pt cancelled due to seizures today. Gave appt date 7/18 at 330 pm

## 2016-12-29 ENCOUNTER — Ambulatory Visit (HOSPITAL_BASED_OUTPATIENT_CLINIC_OR_DEPARTMENT_OTHER): Payer: Medicare Other | Admitting: Oncology

## 2016-12-29 ENCOUNTER — Telehealth: Payer: Self-pay | Admitting: Oncology

## 2016-12-29 VITALS — BP 103/63 | HR 80 | Temp 97.7°F | Resp 20 | Ht 67.0 in | Wt 263.7 lb

## 2016-12-29 DIAGNOSIS — C50212 Malignant neoplasm of upper-inner quadrant of left female breast: Secondary | ICD-10-CM | POA: Diagnosis not present

## 2016-12-29 DIAGNOSIS — C773 Secondary and unspecified malignant neoplasm of axilla and upper limb lymph nodes: Secondary | ICD-10-CM | POA: Diagnosis not present

## 2016-12-29 DIAGNOSIS — C50811 Malignant neoplasm of overlapping sites of right female breast: Secondary | ICD-10-CM

## 2016-12-29 DIAGNOSIS — Z170 Estrogen receptor positive status [ER+]: Secondary | ICD-10-CM

## 2016-12-29 NOTE — Telephone Encounter (Signed)
Scheduled appt per 7/18 los - Gave patient AVS and calender per los.  

## 2016-12-29 NOTE — Progress Notes (Signed)
[place]  [phone] [phone]) [phone]     ID: Vallery Sa DOB: 1971/04/12  MR#: [id number]  MPN#:[id number]  Patient Care Team: Golden Circle, FNP as PCP - General (Family Medicine) Bensimhon, Shaune Pascal, MD as Consulting Physician (Cardiology) Magrinat, Virgie Dad, MD as Consulting Physician (Oncology) Deboraha Sprang, MD as Consulting Physician (Cardiology) Dohmeier, Asencion Partridge, MD as Consulting Physician (Neurology) Chesley Mires, MD as Consulting Physician (Pulmonary Disease) Fanny Skates, MD as Consulting Physician (General Surgery) Eppie Gibson, MD as Attending Physician (Radiation Oncology) PCP: Golden Circle, FNP GYN: Ena Dawley SU:  OTHER MD: Glori Bickers, Arloa Koh, Jolyn Nap, Asencion Partridge Dohmeier  CHIEF COMPLAINT: Contralateral estrogen receptor positive breast cancer  CURRENT TREATMENT: Awaiting definitive surgery   BREAST CANCER HISTORY:  from Dr. Collier Salina Rubin's original intake note dated 08/31/2006   "This woman has been in good health.  She had a screening mammogram done via mobile mammogram unit.  A baseline mammogram did show a lesion or suspicious pigmented area at 9 o'clock in the right breast.  [facility]  A digital right diagnostic mammogram and ultrasound was done on 08/01/2006.  Physical examination did palpate a 1.5 cm mass at 9 o'clock position 7 cm from the nipple.  Ultrasound showed this to be 1.5 x 1.3 x 1.0 cm.  Biopsy was recommended.  Biopsy on 08/01/2006 showed invasive adenocarcinoma associated with DCIS, which appeared to be high-grade.  ER and PR were positive at 99% and 98% respectively.  Proliferative index was 16%.  HER-2/neu was 1+.  The patient underwent a lumpectomy and sentinel lymph node evaluation on 08/22/2006.  Prior to that, a MRI of both breasts was done on 08/10/2006, which documented a 2.4 cm spiculated mass in the breast.  No other abnormalities were  seen.  The lumpectomy and sentinel lymph node evaluation on 08/22/2006 showed a 2.6 cm, grade 3/3 invasive ductal cancer, lymphovascular invasion was seen focally.  Surgical margins were clear.  A total of five lymph nodes were removed at the time of sentinel node, one of which was involved with malignancy.  The patient has had an unremarkable postoperative course."  Her subsequent history is detailed below   HISTORY: OF LEFT (CONTRALATERAL) BREAST CANCER Elmyra Ricks has a history of right-sided breast cancer dating back 10 years. She missed her last 2 appointments with me as "no shows". The last time I saw this patient was in 2016.  More recently she noted a mass in her right breast. She underwent bilateral diagnostic mammography with tomography at the [facility] 12/07/2016 found a breast density to be category B. In the right breast there were only postsurgical changes and no suspicious findings. Ultrasound of the right breast showed an area consistent with a 9 fat necrosis in the 4:00 axis 5 cm from the nipple, measuring 1.8 cm. Biopsy of this area 12/27/2016 confirmed fat necrosis, with no evidence of malignancy ([id number]).  In the left breast however there was an irregular mass associated with architectural distortion, altogether measuring approximately 0.7 centimeters. Ultrasound of the left breast confirmed an irregular hypoechoic mass at the 9:30 o'clock axis 6 cm from the nipple measuring 0.8 cm. The left axilla was sonographically benign.  Biopsy of the left breast mass in question 12/07/2016 found ([id number]) an invasive ductal carcinoma, grade 2, estrogen receptor 100% positive, with strong staining intensity, progesterone receptor negative, with an MIB-1 of 10%, and no HER-2 amplification, the signals ratio being 1.24  and the number per cell 2.05.  This case was discussed at the multidisciplinary breast cancer conference 12/22/2016. At that point a preliminary plan was suggested: Breast  conserving surgery with sentinel lymph node sampling followed by radiation versus consideration of bilateral mastectomies, the concern of course being the patient's congestive heart failure. Anti-estrogens would follow at the completion of local treatment. A full genetics panel was also suggested.  The patient's subsequent history is as detailed below.  INTERVAL HISTORY: The call was seen in the breast clinic 12/29/2016 accompanied by her husband and their daughter Chad  REVIEW OF SYSTEMS:  Despite her many chronic and severe problems Elmyra Ricks generally looks well. She denies uncontrolled pain, uncontrolled shortness of breath, unusual headaches, visual changes, nausea or vomiting. She does get headaches after seizures. She found the left breast biopsy painful but the one on the right more so. They have been no recent infections, no change in bowel or bladder habits, and no bleeding. A detailed review of systems today was otherwise stable   PAST MEDICAL HISTORY: Past Medical History:  Diagnosis Date  . Acne   . Anemia 1980's   [id number]  . Anxiety   . Arthritis   . 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 Blood transfusion 1980's   1987 or 1988  . Breast calcifications    right breast  . Breast cancer (HCC)    right;  . 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 Cardiac MRI 05/2011 EF 32%. ICD implantation 10/2011 ([employer])  . 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 Conversion disorder   . 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 Depression   . Endometriosis   . Fibromyalgia   . GERD (gastroesophageal reflux disease)   . Hepatomegaly   . History of stomach ulcers   . Hyperlipidemia   . Hypertension    c/b orthostatic hypotention  . ICD (implantable cardiac defibrillator) in place   . Insomnia   . Interstitial cystitis   . Left eye injury 12/2014  . 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   . Nonischemic cardiomyopathy ([place])    related to chemo; EF 30% 05/2011  . Obesity   . Palpitation    normal sinus rhythm only on 21 day heart monitor  . Panic attacks   . Peripheral neuropathy    chemo- induced  . Pneumonia 2000's   "once"  . Seasonal allergies   . Type II diabetes mellitus ([place])     PAST SURGICAL HISTORY: Past Surgical History:  Procedure Laterality Date  . ABDOMINAL HYSTERECTOMY     partial  . BREAST EXCISIONAL BIOPSY     right benign 2012  . BREAST LUMPECTOMY  2008; 2013   right radiation  . CARDIAC DEFIBRILLATOR PLACEMENT  11/03/11  . CESAREAN SECTION    . CHOLECYSTECTOMY  ~ 2009  . COLONOSCOPY    . IMPLANTABLE CARDIOVERTER DEFIBRILLATOR IMPLANT N/A 11/03/2011   Procedure: IMPLANTABLE CARDIOVERTER DEFIBRILLATOR IMPLANT;  Surgeon: Deboraha Sprang, MD; MDT   . LAPAROSCOPIC ENDOMETRIOSIS FULGURATION  1990's  . NASAL SINUS SURGERY    . [place] REMOVAL  2010?   left chest; placed in 2008  . TONSILLECTOMY  1980's  . TUBAL LIGATION  1990's    FAMILY HISTORY Family History  Problem Relation Age of Onset  . Heart disease Mother   . Arthritis Mother   . Hypertension  Mother   . Stroke Mother   . Fibromyalgia Mother   . Coronary artery disease Unknown   . Heart attack Unknown   . Heart disease Maternal Uncle   . Colon cancer Paternal Uncle   . Heart disease Maternal Grandmother   . Ovarian cancer Paternal Grandmother   . Fibromyalgia Sister    the patient's father is still living, at age 16. He has a history of Crohn's disease. The patient's mother is living, age 85. She has a history of fibromyalgia. The patient has 3 half-brothers, one sister. The patient's father's mother had either ovarian cancer or cervical cancer. The patient was tested in June 2008 for mutations in the BRCA1 and 2 genes. Complete sequencing found no mutations   GYNECOLOGIC HISTORY:  No LMP  recorded. Patient has had a hysterectomy. menarche age 49, first live birth age 50. She is GX P1. She underwent simple hysterectomy, no salpingo-oophorectomy in 2007.   SOCIAL HISTORY:   she used to work for a [employer] in [employer]. Her husband of 23 years, [name], in addition to his regular job is Theme park manager of their [employer]. There daughter,Maya, is currently 13. The patient attends the [facility] locally.     ADVANCED DIRECTIVES: Not in place. The patient's husband is her healthcare power of attorney   HEALTH MAINTENANCE: Social History  Substance Use Topics  . Smoking status: Never Smoker  . Smokeless tobacco: Never Used  . Alcohol use Yes     Comment: social drinker      Colonoscopy:  PAP:  Bone density:  Lipid panel:  Allergies  Allergen Reactions  . Ace Inhibitors Other (See Comments)    hyperkalemia  . Losartan Other (See Comments)    hyperkalemia  . Reglan [Metoclopramide Hcl] Anxiety  . Adhesive [Tape] Itching and Rash    Current Outpatient Prescriptions  Medication Sig Dispense Refill  . acetaminophen (TYLENOL) 325 MG tablet Take 2 tablets (650 mg total) by mouth every 4 (four) hours as needed for headache or mild pain.    Marland Kitchen albuterol (PROVENTIL HFA;VENTOLIN HFA) 108 (90 Base) MCG/ACT inhaler Inhale 1-2 puffs into the lungs daily.    . baclofen (LIORESAL) 10 MG tablet TAKE 1 TABLET BY MOUTH 2 (TWO) TIMES DAILY AS NEEDED FOR MUSCLE SPASMS. 60 tablet 0  . BLACK CURRANT SEED OIL PO Take 1 tablet by mouth.    . carvedilol (COREG) 25 MG tablet Take 0.5 tablets (12.5 mg total) by mouth 2 (two) times daily with a meal. 60 tablet 6  . Cholecalciferol (VITAMIN D3) 5000 UNITS CAPS Take 1 capsule by mouth daily.     . Coenzyme Q10 (CO Q-10) 100 MG CAPS Take 100 mg by mouth daily.    . Cyanocobalamin 2500 MCG SUBL Place 2,500 mcg under the tongue daily.    . digoxin (LANOXIN) 0.125 MG tablet Take 0.5 tablets (0.0625 mg total) by mouth daily. 30 tablet 2  . divalproex  (DEPAKOTE ER) 250 MG 24 hr tablet (07/18/2016) TAKE 2 TABLETS IN THE MORNING AND 2 TABLETS AT BEDTIME. 120 tablet 3  . DULoxetine (CYMBALTA) 30 MG capsule TAKE 1 CAPSULE EVERY DAY 30 capsule 5  . ferrous sulfate 325 (65 FE) MG tablet Take 325 mg by mouth daily with breakfast.    . GLUCOSAMINE-CHONDROITIN PO Take 1,200 mg by mouth daily.    . hydrOXYzine (ATARAX/VISTARIL) 25 MG tablet Take 25 mg by mouth at bedtime as needed for itching (DOESN'T TAKE WITH TRAZODONE).    Marland Kitchen ivabradine (  CORLANOR) 5 MG TABS tablet Take 1 tablet (5 mg total) by mouth 2 (two) times daily with a meal. 180 tablet 3  . loratadine (CLARITIN) 10 MG tablet Take 10 mg by mouth daily.    Marland Kitchen losartan (COZAAR) 25 MG tablet Take 1 tablet (25 mg total) by mouth daily. 90 tablet 3  . metolazone (ZAROXOLYN) 2.5 MG tablet Take 1 tablet (2.5 mg total) by mouth daily as needed (edema). 15 tablet 0  . Milk Thistle 150 MG CAPS Take 450 mg by mouth daily.     . Multiple Vitamin (MULTIVITAMIN WITH MINERALS) TABS Take 1 tablet by mouth daily. Reported on 10/13/2015    . omeprazole (PRILOSEC) 40 MG capsule Take 1 capsule (40 mg total) by mouth 2 (two) times daily before a meal. Breakfast and supper 60 capsule 9  . ondansetron (ZOFRAN) 4 MG tablet Take 1 tablet (4 mg total) by mouth every 8 (eight) hours as needed for nausea or vomiting. 20 tablet 3  . PATADAY 0.2 % SOLN Place 1 drop into both eyes daily.     . pentosan polysulfate (ELMIRON) 100 MG capsule Take 200 mg by mouth daily.    . potassium chloride SA (K-DUR,KLOR-CON) 20 MEQ tablet Take 2 tablets (40 mEq total) by mouth daily. 180 tablet 3  . spironolactone (ALDACTONE) 25 MG tablet TAKE 0.5 TABLETS (12.5 MG TOTAL) BY MOUTH DAILY. 45 tablet 3  . torsemide (DEMADEX) 20 MG tablet TAKE 2 TABLETS (40 MG TOTAL) BY MOUTH DAILY. 60 tablet 6  . traZODone (DESYREL) 50 MG tablet Take 50 mg by mouth as needed. FOR SLEEP     No current facility-administered medications for this visit.      OBJECTIVE:  African-American womanWho appears stated age  46:   12/29/16 1544  BP: 103/63  Pulse: 80  Resp: 20  Temp: 97.7 F (36.5 C)     Body mass index is 41.3 kg/m.    ECOG FS:2 - Symptomatic, <50% confined to bed  Sclerae unicteric, pupils round and equal Oropharynx clear and moist No cervical or supraclavicular adenopathy Lungs no rales or rhonchi Heart regular rate and rhythm Abd soft, obese, nontender, positive bowel sounds MSK no focal spinal tenderness, no upper extremity lymphedema Neuro: nonfocal, well oriented, appropriate affect Breasts: The right breast is status post prior lumpectomy and radiation. It is considerably smaller than the left. The breast contour is disrupted. There is no evidence of local recurrence. The left breast is larger, with a normal contour, and I did not palpate a well-defined mass. There no skin or nipple changes of concern. Both axillae are benign.  LAB RESULTS:  CMP     Component Value Date/Time   NA 135 10/28/2016 0851   NA 139 07/13/2016 1606   NA 141 09/19/2014 1133   K 3.7 10/28/2016 0851   K 3.5 09/19/2014 1133   CL 95 (L) 10/28/2016 0851   CO2 29 10/28/2016 0851   CO2 23 09/19/2014 1133   GLUCOSE 122 (H) 10/28/2016 0851   GLUCOSE 103 09/19/2014 1133   BUN 12 10/28/2016 0851   BUN 17 07/13/2016 1606   BUN 11.6 09/19/2014 1133   CREATININE 1.09 (H) 10/28/2016 0851   CREATININE 0.9 09/19/2014 1133   CALCIUM 9.1 10/28/2016 0851   CALCIUM 8.6 09/19/2014 1133   PROT 8.2 07/13/2016 1606   PROT 7.2 09/19/2014 1133   ALBUMIN 4.4 07/13/2016 1606   ALBUMIN 3.4 (L) 09/19/2014 1133   AST 21 07/13/2016 1606   AST  21 09/19/2014 1133   ALT 12 07/13/2016 1606   ALT 14 09/19/2014 1133   ALKPHOS 99 07/13/2016 1606   ALKPHOS 87 09/19/2014 1133   BILITOT 0.8 07/13/2016 1606   BILITOT 1.62 (H) 09/19/2014 1133   GFRNONAA 60 (L) 10/28/2016 0851   GFRAA >60 10/28/2016 0851    INo results found for: SPEP, UPEP  Lab Results   Component Value Date   WBC 5.6 07/13/2016   NEUTROABS 3.0 07/13/2016   HGB 12.3 07/13/2016   HCT 36.3 07/13/2016   MCV 95 07/13/2016   PLT 181 07/13/2016      Chemistry      Component Value Date/Time   NA 135 10/28/2016 0851   NA 139 07/13/2016 1606   NA 141 09/19/2014 1133   K 3.7 10/28/2016 0851   K 3.5 09/19/2014 1133   CL 95 (L) 10/28/2016 0851   CO2 29 10/28/2016 0851   CO2 23 09/19/2014 1133   BUN 12 10/28/2016 0851   BUN 17 07/13/2016 1606   BUN 11.6 09/19/2014 1133   CREATININE 1.09 (H) 10/28/2016 0851   CREATININE 0.9 09/19/2014 1133   GLU 89 02/18/2015      Component Value Date/Time   CALCIUM 9.1 10/28/2016 0851   CALCIUM 8.6 09/19/2014 1133   ALKPHOS 99 07/13/2016 1606   ALKPHOS 87 09/19/2014 1133   AST 21 07/13/2016 1606   AST 21 09/19/2014 1133   ALT 12 07/13/2016 1606   ALT 14 09/19/2014 1133   BILITOT 0.8 07/13/2016 1606   BILITOT 1.62 (H) 09/19/2014 1133       Lab Results  Component Value Date   LABCA2 24 08/19/2011    No components found for: [id number]  No results for input(s): INR in the last 168 hours.  Urinalysis    Component Value Date/Time   COLORURINE AMBER (A) 02/18/2016 1741   APPEARANCEUR CLEAR 02/18/2016 1741   LABSPEC 1.018 02/18/2016 1741   PHURINE 5.5 02/18/2016 1741   GLUCOSEU NEGATIVE 02/18/2016 1741   HGBUR NEGATIVE 02/18/2016 1741   BILIRUBINUR SMALL (A) 02/18/2016 1741   KETONESUR 15 (A) 02/18/2016 1741   PROTEINUR NEGATIVE 02/18/2016 1741   UROBILINOGEN 0.2 05/02/2008 1222   NITRITE NEGATIVE 02/18/2016 1741   LEUKOCYTESUR NEGATIVE 02/18/2016 1741    STUDIES: US Breast Ltd Uni Left Inc Axilla  Result Date: 12/07/2016 CLINICAL DATA:  History of right breast cancer in 2012 status post lumpectomy and radiation therapy. Patient describes a new palpable lump within the inner right breast. EXAM: 2D DIGITAL DIAGNOSTIC BILATERAL MAMMOGRAM WITH CAD AND ADJUNCT TOMO ULTRASOUND BILATERAL BREAST COMPARISON:  Previous  exam(s). ACR Breast Density Category b: There are scattered areas of fibroglandular density. FINDINGS: Right breast: There are stable postsurgical changes within the right breast. There are no new dominant masses, suspicious calcifications or secondary signs of malignancy within the right breast. Specifically, there is no mammographic abnormality within the inner right breast corresponding to the area of clinical concern. Left breast: There is an irregular mass within the inner left breast, at middle to posterior depth, with associated architectural distortion, measuring approximately 7 mm greatest dimension. Mammographic images were processed with CAD. Right breast: Targeted ultrasound is performed, showing a predominantly hyperechoic area in the right breast at the 4 o'clock axis, 5 cm from the nipple, at superficial depth, measuring 1.8 x 1.2 x 1.7 cm, with tiny cystic foci internally, avascular, most suggestive of benign fat necrosis during real-time ultrasound evaluation. Left breast: Targeted ultrasound is performed, showing an irregular  hypoechoic mass in the left breast at the 9:30 o'clock axis, 6 cm from the nipple, measuring 8 mm, corresponding to the mammographic finding. Left axilla was evaluated with ultrasound showing no enlarged or morphologically abnormal lymph nodes. IMPRESSION: 1. Suspicious mass within the left breast at the 9:30 o'clock axis, 6 cm from the nipple, measuring 8 mm, corresponding to an incidental mammographic finding. Ultrasound-guided biopsy core is recommended. 2. Probably benign fat necrosis within the right breast at the 4 o'clock axis, 5 cm from the nipple, measuring 1.8 x 1.2 x 1.7 cm. Recommend follow-up diagnostic exam in 3 months to ensure resolution. RECOMMENDATION: 1. Ultrasound-guided biopsy for the left breast mass at the 9:30 o'clock axis, 6 cm from the nipple, measuring 8 mm. 2. Follow-up right breast diagnostic mammogram and ultrasound in 3 months to ensure  resolution of the probably benign fat necrosis. Ultrasound-guided core biopsy for the left breast mass is scheduled for later today. I have discussed the findings and recommendations with the patient. Results were also provided in writing at the conclusion of the visit. If applicable, a reminder letter will be sent to the patient regarding the next appointment. BI-RADS CATEGORY  4: Suspicious. Electronically Signed   By: Franki Cabot M.D.   On: 12/07/2016 12:06   US Breast Ltd Uni Right Inc Axilla  Result Date: 12/07/2016 CLINICAL DATA:  History of right breast cancer in 2012 status post lumpectomy and radiation therapy. Patient describes a new palpable lump within the inner right breast. EXAM: 2D DIGITAL DIAGNOSTIC BILATERAL MAMMOGRAM WITH CAD AND ADJUNCT TOMO ULTRASOUND BILATERAL BREAST COMPARISON:  Previous exam(s). ACR Breast Density Category b: There are scattered areas of fibroglandular density. FINDINGS: Right breast: There are stable postsurgical changes within the right breast. There are no new dominant masses, suspicious calcifications or secondary signs of malignancy within the right breast. Specifically, there is no mammographic abnormality within the inner right breast corresponding to the area of clinical concern. Left breast: There is an irregular mass within the inner left breast, at middle to posterior depth, with associated architectural distortion, measuring approximately 7 mm greatest dimension. Mammographic images were processed with CAD. Right breast: Targeted ultrasound is performed, showing a predominantly hyperechoic area in the right breast at the 4 o'clock axis, 5 cm from the nipple, at superficial depth, measuring 1.8 x 1.2 x 1.7 cm, with tiny cystic foci internally, avascular, most suggestive of benign fat necrosis during real-time ultrasound evaluation. Left breast: Targeted ultrasound is performed, showing an irregular hypoechoic mass in the left breast at the 9:30 o'clock axis, 6  cm from the nipple, measuring 8 mm, corresponding to the mammographic finding. Left axilla was evaluated with ultrasound showing no enlarged or morphologically abnormal lymph nodes. IMPRESSION: 1. Suspicious mass within the left breast at the 9:30 o'clock axis, 6 cm from the nipple, measuring 8 mm, corresponding to an incidental mammographic finding. Ultrasound-guided biopsy core is recommended. 2. Probably benign fat necrosis within the right breast at the 4 o'clock axis, 5 cm from the nipple, measuring 1.8 x 1.2 x 1.7 cm. Recommend follow-up diagnostic exam in 3 months to ensure resolution. RECOMMENDATION: 1. Ultrasound-guided biopsy for the left breast mass at the 9:30 o'clock axis, 6 cm from the nipple, measuring 8 mm. 2. Follow-up right breast diagnostic mammogram and ultrasound in 3 months to ensure resolution of the probably benign fat necrosis. Ultrasound-guided core biopsy for the left breast mass is scheduled for later today. I have discussed the findings and recommendations with the patient.  Results were also provided in writing at the conclusion of the visit. If applicable, a reminder letter will be sent to the patient regarding the next appointment. BI-RADS CATEGORY  4: Suspicious. Electronically Signed   By: Franki Cabot M.D.   On: 12/07/2016 12:06   Mm Diag Breast Tomo Bilateral  Result Date: 12/07/2016 CLINICAL DATA:  History of right breast cancer in 2012 status post lumpectomy and radiation therapy. Patient describes a new palpable lump within the inner right breast. EXAM: 2D DIGITAL DIAGNOSTIC BILATERAL MAMMOGRAM WITH CAD AND ADJUNCT TOMO ULTRASOUND BILATERAL BREAST COMPARISON:  Previous exam(s). ACR Breast Density Category b: There are scattered areas of fibroglandular density. FINDINGS: Right breast: There are stable postsurgical changes within the right breast. There are no new dominant masses, suspicious calcifications or secondary signs of malignancy within the right breast.  Specifically, there is no mammographic abnormality within the inner right breast corresponding to the area of clinical concern. Left breast: There is an irregular mass within the inner left breast, at middle to posterior depth, with associated architectural distortion, measuring approximately 7 mm greatest dimension. Mammographic images were processed with CAD. Right breast: Targeted ultrasound is performed, showing a predominantly hyperechoic area in the right breast at the 4 o'clock axis, 5 cm from the nipple, at superficial depth, measuring 1.8 x 1.2 x 1.7 cm, with tiny cystic foci internally, avascular, most suggestive of benign fat necrosis during real-time ultrasound evaluation. Left breast: Targeted ultrasound is performed, showing an irregular hypoechoic mass in the left breast at the 9:30 o'clock axis, 6 cm from the nipple, measuring 8 mm, corresponding to the mammographic finding. Left axilla was evaluated with ultrasound showing no enlarged or morphologically abnormal lymph nodes. IMPRESSION: 1. Suspicious mass within the left breast at the 9:30 o'clock axis, 6 cm from the nipple, measuring 8 mm, corresponding to an incidental mammographic finding. Ultrasound-guided biopsy core is recommended. 2. Probably benign fat necrosis within the right breast at the 4 o'clock axis, 5 cm from the nipple, measuring 1.8 x 1.2 x 1.7 cm. Recommend follow-up diagnostic exam in 3 months to ensure resolution. RECOMMENDATION: 1. Ultrasound-guided biopsy for the left breast mass at the 9:30 o'clock axis, 6 cm from the nipple, measuring 8 mm. 2. Follow-up right breast diagnostic mammogram and ultrasound in 3 months to ensure resolution of the probably benign fat necrosis. Ultrasound-guided core biopsy for the left breast mass is scheduled for later today. I have discussed the findings and recommendations with the patient. Results were also provided in writing at the conclusion of the visit. If applicable, a reminder letter  will be sent to the patient regarding the next appointment. BI-RADS CATEGORY  4: Suspicious. Electronically Signed   By: Franki Cabot M.D.   On: 12/07/2016 12:06   Mm Clip Placement Left  Result Date: 12/07/2016 CLINICAL DATA:  Status post ultrasound-guided biopsy earlier today for a left breast mass. EXAM: DIAGNOSTIC LEFT MAMMOGRAM POST ULTRASOUND BIOPSY COMPARISON:  Previous exam(s). FINDINGS: Mammographic images were obtained following ultrasound guided biopsy of the left breast mass at the 9:30 o'clock axis. At the conclusion of the procedure, a ribbon shaped tissue marker was placed at the biopsy site. Biopsy clip is well-positioned at the site of the targeted mass, corresponding to the original mammographic finding. IMPRESSION: Ribbon shaped biopsy clip is well-positioned at the site of the targeted mass within the inner left breast, corresponding to the original mammographic finding. Final Assessment: Post Procedure Mammograms for Marker Placement Electronically Signed   By: Cherlynn Kaiser  Enriqueta Shutter M.D.   On: 12/07/2016 14:29   Mm Clip Placement Right  Result Date: 12/27/2016 CLINICAL DATA:  Status post ultrasound-guided core biopsy of hyperechoic palpable mass in the lower inner quadrant of the right breast. EXAM: DIAGNOSTIC RIGHT MAMMOGRAM POST ULTRASOUND BIOPSY COMPARISON:  Previous exam(s). FINDINGS: Mammographic images were obtained following ultrasound guided biopsy of mass in the 4 o'clock location of the right breast. A ribbon shaped clip is identified in the lower inner quadrant as expected. IMPRESSION: Tissue marker clip in the expected location following biopsy. Final Assessment: Post Procedure Mammograms for Marker Placement Electronically Signed   By: Nolon Nations M.D.   On: 12/27/2016 13:58   [place] Lt Breast Bx W Loc Dev 1st Lesion Img Bx Spec US Guide  Addendum Date: 12/08/2016   ADDENDUM REPORT: 12/08/2016 14:39 ADDENDUM: Pathology revealed GRADE II INVASIVE DUCTAL CARCINOMA, DUCTAL  CARCINOMA IN SITU of the Left breast, 9:30 o'clock. This was found to be concordant by Dr. Franki Cabot. Pathology results were discussed with the patient by telephone. The patient reported doing well after the biopsy with tenderness at the site. Post biopsy instructions and care were reviewed and questions were answered. The patient was encouraged to call [facility] for any additional concerns. Surgical consultation has been arranged with Dr. Fanny Skates at [facility] on December 09, 2016. Pathology results reported by Terie Purser, RN on 12/08/2016. Electronically Signed   By: Franki Cabot M.D.   On: 12/08/2016 14:39   Result Date: 12/08/2016 CLINICAL DATA:  Patient with a suspicious left breast mass found on diagnostic examination earlier today presents now for ultrasound-guided core biopsy. History of right breast cancer status post lumpectomy. EXAM: ULTRASOUND GUIDED LEFT BREAST CORE NEEDLE BIOPSY COMPARISON:  Previous exam(s). PROCEDURE: I met with the patient and we discussed the procedure of ultrasound-guided biopsy, including benefits and alternatives. We discussed the high likelihood of a successful procedure. We discussed the risks of the procedure including infection, bleeding, tissue injury, clip migration, and inadequate sampling. Informed written consent was given. The usual time-out protocol was performed immediately prior to the procedure. Lesion quadrant: Upper inner quadrant Using sterile technique and 1% Lidocaine as local anesthetic, under direct ultrasound visualization, a 12 gauge spring-loaded device was used to perform biopsy of the irregular mass within the left breast at the 9:30 o'clock axisusing a lateral approach. At the conclusion of the procedure, a ribbon shaped tissue marker clip was deployed into the biopsy cavity. Follow-up 2-view mammogram was performed and dictated separately. IMPRESSION: Ultrasound-guided biopsy of the left breast  mass at the 9:30 o'clock axis. No apparent complications. Electronically Signed: By: Franki Cabot M.D. On: 12/07/2016 14:23   [place] Rt Breast Bx W Loc Dev 1st Lesion Img Bx Spec US Guide  Addendum Date: 12/28/2016   ADDENDUM REPORT: 12/28/2016 14:12 ADDENDUM: Pathology revealed FAT NECROSIS of the Right breast, 4:00 o'clock. This was found to be concordant by Dr. Nolon Nations. Pathology results were discussed with the patient by telephone. The patient reported doing well after the biopsy with tenderness at the site. Post biopsy instructions and care were reviewed and questions were answered. The patient was encouraged to call [facility] for any additional concerns. The patient has a recent diagnosis of left breast cancer and should follow her outlined treatment plan. Pathology results reported by Terie Purser, RN on 12/28/2016. Electronically Signed   By: Nolon Nations M.D.   On: 12/28/2016 14:12  Result Date: 12/28/2016 CLINICAL DATA:  Patient presents for ultrasound-guided core biopsy of palpable mass in the right breast 4 o'clock location. History of right lumpectomy in 2008. 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 EXAM: ULTRASOUND GUIDED RIGHT BREAST CORE NEEDLE BIOPSY COMPARISON:  Previous exam(s). FINDINGS: I met with the patient and we discussed the procedure of ultrasound-guided biopsy, including benefits and alternatives. We discussed the high likelihood of a successful procedure. We discussed the risks of the procedure, including infection, bleeding, tissue injury, clip migration, and inadequate sampling. Informed written consent was given. The usual time-out protocol was performed immediately prior to the procedure. Lesion quadrant: Lower inner quadrant right breast Using sterile technique and 1% Lidocaine as local anesthetic, under direct ultrasound visualization, a 12 gauge spring-loaded device was used to perform biopsy of palpable  area of hyperechoic breast parenchyma in the 4 o'clock location of the right breast using a inferior approach. At the conclusion of the procedure a ribbon shaped tissue marker clip was deployed into the biopsy cavity. Follow up 2 view mammogram was performed and dictated separately. IMPRESSION: Ultrasound guided biopsy of right breast mass. No apparent complications. Electronically Signed: By: Nolon Nations M.D. On: 12/27/2016 13:50     ASSESSMENT: 46 y.o.  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% positive, progesterone receptor 98% positive, with an MIB-1 of 16% and no HER-2 amplification   (a) biopsy of a palpable area in the right breast 4:00 position 12/27/2016 showed only fat necrosis  (2) completion axillary dissection 09/07/2006 showed no additional positive lymph nodes (total of 17 lymph nodes sampled, one positive)  (3) adjuvant chemotherapy followed E[facility]G 10/12/2001, arm D   (a) started 10/21/2006 and consisted of cyclophosphamide and doxorubicin given in dose dense fashion 4, completed 12/02/2006 (total doxorubicin dose 240 mg/m), followed by   (b) weekly paclitaxel 12 together with either bevacizumab or placebo given every 3 weeks, completed 03/03/2007    (c) bevacizumab was to have been continued, but protocol-mandated echocardiogram 03/06/2007 showed the patient's previously normal left ventricular ejection fraction to have dropped to 35%.  (4)  completed radiation treatment to the right breast 05/17/2007-- 5040 cGy plus a 1260 cGy boost  (5) received letrozole between December 2008 and March 2013  LEFT BREAST CANCER:  (6) status post left breast upper inner quadrant biopsy 12/07/2016 for a clinical T1b N0, stage IA invasive ductal carcinoma, grade 2, estrogen receptor positive, progesterone receptor  negative, with no HER-2 amplification, and an MIB-1 of 10%.  (7) definitive surgery pending  (8) genetics testing scheduled for 01/13/2017  (9) adjuvant radiation to follow as appropriate  (10) anti-estrogens to follow with the completion of local treatment.  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 She understands she now has a different cancer in the opposite breast. This is not clearly related to her  earlier cancer. This does raise the possibility of her having a demonstrable genetic abnormality and she is scheduled for genetic testing the first week in August.  In patients who carry a deleterious mutation [for example in a  BRCA gene], the risk of a new breast cancer developing in the future may be sufficiently great that the patient may choose bilateral mastectomies. However if she wishes to keep her breasts in that situation it is safe to do so. That would require intensified screening, which generally means not only yearly mammography but a yearly breast MRI as well. Of course, if there is a deleterious mutation bilateral oophorectomy would be necessary as there is no standard screening protocol for ovarian cancer.  If Elmyra Ricks did have a deleterious mutation she would be interested in bilateral mastectomies and therefore we are going to have to wait until those results are available. If she did have bilateral mastectomies she might want reconstruction, and that would delay her  treatment even longer. For that reason we are starting her on anti-estrogens now.  Before choosing the antiestrogen we went over the details of recurrent cancer, which is stage I, not very aggressive, and slow-growing. It is strongly estrogen receptor positive. Given these facts, her severe comorbidities particularly cardiac, and the fact that she has already had optimal chemotherapy for her first, right sided breast cancer, I am not anticipating chemotherapy to be used in this case.  Instead, the plan would be for lumpectomy with sentinel lymph node sampling assuming there is no definable mutation, followed by adjuvant radiation. She will continue on anti-estrogens for a total of 5 years.  We then discussed the antiestrogen choices. She had been on letrozole previously and tolerated it well. That is certainly an option. She would not be in my opinion a good candidate for tamoxifen given concerns regarding clotting. On the other hand she would be a good candidate for fulvestrant. This is generally very well tolerated except for the actual administration itself. It is as effective as aromatase inhibitors when compared head-to-head and has the advantage that she has not had this in the past as she has had aromatase inhibitors before  Given all this she opted for fulvestrant. I have set her for the first treatment next week. I am not going to give her a loading dose at 2 weeks but we will simply start monthly treatments from the first dose.  She is going to see me with the second dose, by which time I expect she will have had her definitive surgery and we should be able to review the pathology reports  Elmyra Ricks has a good understanding of the overall plan. She agrees with it. She knows a goal of treatment in her case is cure. She will call with any problems that may develop before her next visit. Chauncey Cruel, MD   12/29/2016 8:19 PM

## 2016-12-31 ENCOUNTER — Telehealth: Payer: Self-pay | Admitting: Pulmonary Disease

## 2016-12-31 NOTE — Telephone Encounter (Signed)
Please schedule ROV with me.  Can be 15 minute time slot.  Okay to double book visit.

## 2016-12-31 NOTE — Progress Notes (Signed)
I agree with the assessment and plan -The patient is known to me .   Cassidy Tashiro, MD  

## 2017-01-04 ENCOUNTER — Telehealth (HOSPITAL_COMMUNITY): Payer: Self-pay | Admitting: *Deleted

## 2017-01-04 ENCOUNTER — Other Ambulatory Visit (HOSPITAL_BASED_OUTPATIENT_CLINIC_OR_DEPARTMENT_OTHER): Payer: Medicare Other

## 2017-01-04 ENCOUNTER — Ambulatory Visit (HOSPITAL_BASED_OUTPATIENT_CLINIC_OR_DEPARTMENT_OTHER): Payer: Medicare Other

## 2017-01-04 VITALS — BP 104/57 | HR 72 | Temp 98.1°F | Resp 18

## 2017-01-04 DIAGNOSIS — C50212 Malignant neoplasm of upper-inner quadrant of left female breast: Secondary | ICD-10-CM

## 2017-01-04 DIAGNOSIS — Z170 Estrogen receptor positive status [ER+]: Principal | ICD-10-CM

## 2017-01-04 DIAGNOSIS — Z5111 Encounter for antineoplastic chemotherapy: Secondary | ICD-10-CM | POA: Diagnosis present

## 2017-01-04 DIAGNOSIS — C773 Secondary and unspecified malignant neoplasm of axilla and upper limb lymph nodes: Secondary | ICD-10-CM | POA: Diagnosis not present

## 2017-01-04 DIAGNOSIS — C50811 Malignant neoplasm of overlapping sites of right female breast: Secondary | ICD-10-CM

## 2017-01-04 LAB — CBC WITH DIFFERENTIAL/PLATELET
BASO%: 1.3 % (ref 0.0–2.0)
BASOS ABS: 0.1 10*3/uL (ref 0.0–0.1)
EOS ABS: 0 10*3/uL (ref 0.0–0.5)
EOS%: 1 % (ref 0.0–7.0)
HCT: 39.3 % (ref 34.8–46.6)
HGB: 12.8 g/dL (ref 11.6–15.9)
LYMPH%: 25.1 % (ref 14.0–49.7)
MCH: 31.6 pg (ref 25.1–34.0)
MCHC: 32.7 g/dL (ref 31.5–36.0)
MCV: 96.7 fL (ref 79.5–101.0)
MONO#: 0.6 10*3/uL (ref 0.1–0.9)
MONO%: 14.4 % — AB (ref 0.0–14.0)
NEUT#: 2.4 10*3/uL (ref 1.5–6.5)
NEUT%: 58.2 % (ref 38.4–76.8)
Platelets: 120 10*3/uL — ABNORMAL LOW (ref 145–400)
RBC: 4.07 10*6/uL (ref 3.70–5.45)
RDW: 15.7 % — ABNORMAL HIGH (ref 11.2–14.5)
WBC: 4.1 10*3/uL (ref 3.9–10.3)
lymph#: 1 10*3/uL (ref 0.9–3.3)

## 2017-01-04 LAB — COMPREHENSIVE METABOLIC PANEL
ALBUMIN: 3.6 g/dL (ref 3.5–5.0)
ALK PHOS: 106 U/L (ref 40–150)
ALT: 9 U/L (ref 0–55)
AST: 18 U/L (ref 5–34)
Anion Gap: 12 mEq/L — ABNORMAL HIGH (ref 3–11)
BUN: 21.4 mg/dL (ref 7.0–26.0)
CHLORIDE: 96 meq/L — AB (ref 98–109)
CO2: 30 meq/L — AB (ref 22–29)
Calcium: 9.4 mg/dL (ref 8.4–10.4)
Creatinine: 1.4 mg/dL — ABNORMAL HIGH (ref 0.6–1.1)
EGFR: 51 mL/min/{1.73_m2} — AB (ref >90)
Glucose: 84 mg/dl (ref 70–140)
POTASSIUM: 4 meq/L (ref 3.5–5.1)
SODIUM: 138 meq/L (ref 136–145)
Total Bilirubin: 1.7 mg/dL — ABNORMAL HIGH (ref 0.20–1.20)
Total Protein: 7.9 g/dL (ref 6.4–8.3)

## 2017-01-04 MED ORDER — FULVESTRANT 250 MG/5ML IM SOLN
500.0000 mg | Freq: Once | INTRAMUSCULAR | Status: AC
  Administered 2017-01-04: 500 mg via INTRAMUSCULAR
  Filled 2017-01-04: qty 10

## 2017-01-04 NOTE — Telephone Encounter (Signed)
Patient called asking if there were any flying restrictions associated with her defibrillator. Per Jonni Sanger she should contact her EP doctor to be sure there were no restrictions. Patient aware and agreeable.

## 2017-01-05 ENCOUNTER — Ambulatory Visit (INDEPENDENT_AMBULATORY_CARE_PROVIDER_SITE_OTHER): Payer: Medicare Other | Admitting: Neurology

## 2017-01-05 ENCOUNTER — Encounter: Payer: Self-pay | Admitting: Neurology

## 2017-01-05 ENCOUNTER — Other Ambulatory Visit: Payer: Self-pay | Admitting: Internal Medicine

## 2017-01-05 VITALS — BP 115/73 | HR 75 | Ht 67.0 in | Wt 264.0 lb

## 2017-01-05 DIAGNOSIS — M1049 Other secondary gout, multiple sites: Secondary | ICD-10-CM | POA: Diagnosis not present

## 2017-01-05 DIAGNOSIS — T451X5A Adverse effect of antineoplastic and immunosuppressive drugs, initial encounter: Secondary | ICD-10-CM

## 2017-01-05 DIAGNOSIS — G620 Drug-induced polyneuropathy: Secondary | ICD-10-CM

## 2017-01-05 DIAGNOSIS — F444 Conversion disorder with motor symptom or deficit: Secondary | ICD-10-CM | POA: Diagnosis not present

## 2017-01-05 MED ORDER — DIVALPROEX SODIUM ER 500 MG PO TB24: ORAL_TABLET | ORAL | 5 refills | Status: DC

## 2017-01-05 MED ORDER — DULOXETINE HCL 30 MG PO CPEP
30.0000 mg | ORAL_CAPSULE | Freq: Two times a day (BID) | ORAL | 5 refills | Status: DC
Start: 2017-01-05 — End: 2017-07-23

## 2017-01-05 NOTE — Patient Instructions (Signed)

## 2017-01-05 NOTE — Progress Notes (Signed)
[facility]  PATIENT: [name] DOB: 08/09/1970   REASON FOR VISIT: Myoclonus  HISTORY FROM: Patient   Interval history from 01/05/2017, [name] is a 46 year old African-American lady and a patient of mine since 2010. She has a history of conversion disorder, she has a history of neuropathy chemotherapy-induced, she also has developed multiple joint and back pain locations. She feels pain all over but now. She was found to have a higher level of uric acid and had just started a new round of chemotherapy in the treatment of metastatic breast cancer. Chemotherapy can of course worsen or initiate a gout flare up. She reports more frequent spells again with a seizure-like and occur in form of staring into space and sometimes of convulsions, there is also functional stuttering noted. She had done very well with Depakote as it also helped with anxiety. I will today consider raising the Depakote dose after reviewing her recent metabolic labs and her CBC. Her liver function tests are normal. After her last visit  Depakote  Was raised to 500 mg twice a day. May raise to 750 mg at night today.  I would also like her to keep taking Cymbalta as it has helped with some of the pain. The patient was diagnosed with a recurrence of breast cancer in the left breast, this is her third occurrence.  As we speak today the patient developed twisting and turning shoulder and neck movements that are not seizures. She reports being diagnosed with fibromyalgia, and is returning to see rheumatology for the newly diagnosed gout. I will increase Cymbalta. RV in 6 month    HISTORY OF PRESENT ILLNESS:  [name], 46 year old  Afro-american right handed female returns for follow-up she was last seen in this office by Dr. Brett Fairy 07/25/2013. At that time she noticed myoclonus during exam. EMG nerve conduction done by Dr. Jannifer Franklin was normal, repetitive nerve stimulation study was normal.  EEG was normal.  She was placed on Depakote 250 daily. She returns today for refills. In addition she wants treatment for her conversion disorder. She had an admission 11/26/2014 for congestive heart failure. 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 [name] is here today in the presence of her husband, and reports that her conversion disorder has been ongoing waxing and waning affecting her speech and fluency of speech. But it was her seizures and spells that have been well controlled on Depakote and now seem to flare up again. Just over the last couple of months she has experienced more frequent spells more violent shaking and what may begin with a mild trembling can become a high amplitude motor jerking is not interrupted. Mr. Valentine reports that his wife is usually making him aware of his seizure coming on, and what he sees is her grinding her teeth or biting  teeth repeatedly, and he will look for the mouth plate that is supposed to help her not bite her tongue. Usually has spells lasted minutes but in September the become lasting longer than before, and she was seen in the emergency room.  Her husband felt it was 15 minutes. She was seen in the emergency room on September 6th 2017  , [place]. She has frequently bit her tongue and that's the pain she feels when coming to . There is another form of spells not related to tonic-clonic activity during which she just stares off and seems not to be able to do verbal response she  doesn't even direct her gaze. She stares into space. She knows exactly what is going on she's not losing awareness of her surroundings but the inability to respond to stimuli.She has still an EF of 25%.    Last visit  CD :  [name] is a well-established patient in our practice I have followed for over 8 years now. The patient developed breast cancer at a young age, she was diagnosed more or less coincidental after a mammogram was offered at her  workplace. She underwent surgery, chemotherapy and radiation. Late effects of her therapies included a cardiomyopathy with reduced ejection fraction -for which he follows regularly with cardiology, She developed a chemotherapy induced neuropathy, and overall lethargy and malaise. The patient never felt quite as energetic or rested as she did prior to her cance diagnoses. 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 She still speaks this way today, poorly modulated dysarthic speech today. Brain MRI in 07-2011 , before implanation of her defibrillator , was normal- no extensive WM disease, nor metastatis, nor strokes.  EEG was normal in February 2013 ,but for benzodiazepine related artefact. A polysomnography on 08-06-2011 evaluated her for fatigue and EDS, and returned with normal results, her AHI was 0.5/hr.She has a lot of fatigue she does not sleep well and her mother now reported that she has been jerks and jumps.  The way her mother describes it to me today is that [name] twitches in all 4 extremities at once- sometimes has ophistotonus, the whole body jerking with a pelvic thrust  and sometimes the movements are repeated so often that she is concerned about her daughter contracting injuries.   She has also had a tongue bite- chewed her tongue at night which can be associated with seizures. She is completely amnestic for the events, and her mother reports these can go on for several minutes, 20- 30 minutes. She remains asleep and does not wake up. At times she yells loudly in her sleep, amnestic for these spells , too.  She has noticed isolated myoclonic jerks in one hand or foot in daytime, mostly in her  right , dominant hand.  She is just very tired afterwards. She has not had incontinence during these events.    REVIEW OF SYSTEMS: Full 14 system review of systems performed and notable only for those listed, all others are neg:   Daytime sleepiness insomnia, SOB, CHF related, anxiety. Conversion disorder, back pain, deep bone pain. New lesion in lung found.  Gout .    ALLERGIES: Allergies  Allergen Reactions  . Ace Inhibitors Other (See Comments)    hyperkalemia  . Losartan Other (See Comments)    hyperkalemia  . Reglan [Metoclopramide Hcl] Anxiety  . Adhesive [Tape] Itching and Rash    HOME MEDICATIONS: Outpatient Medications Prior to Visit  Medication Sig Dispense Refill  . acetaminophen (TYLENOL) 325 MG tablet Take 2 tablets (650 mg total) by mouth every 4 (four) hours as needed for headache or mild pain.    Marland Kitchen albuterol (PROVENTIL HFA;VENTOLIN HFA) 108 (90 Base) MCG/ACT inhaler Inhale 1-2 puffs into the lungs daily.    . baclofen (LIORESAL) 10 MG tablet TAKE 1 TABLET BY MOUTH 2 (TWO) TIMES DAILY AS NEEDED FOR MUSCLE  SPASMS. 60 tablet 0  . BLACK CURRANT SEED OIL PO Take 1 tablet by mouth.    . carvedilol (COREG) 25 MG tablet Take 0.5 tablets (12.5 mg total) by mouth 2 (two) times daily with a meal. 60 tablet 6  . Cholecalciferol (VITAMIN D3) 5000 UNITS CAPS Take 1 capsule by mouth daily.     . Coenzyme Q10 (CO Q-10) 100 MG CAPS Take 100 mg by mouth daily.    . Cyanocobalamin 2500 MCG SUBL Place 2,500 mcg under the tongue daily.    . digoxin (LANOXIN) 0.125 MG tablet Take 0.5 tablets (0.0625 mg total) by mouth daily. 30 tablet 2  . divalproex (DEPAKOTE ER) 250 MG 24 hr tablet (07/18/2016) TAKE 2 TABLETS IN THE MORNING AND 2 TABLETS AT BEDTIME. 120 tablet 3  . DULoxetine (CYMBALTA) 30 MG capsule TAKE 1 CAPSULE EVERY DAY 30 capsule 5  . ferrous sulfate 325 (65 FE) MG tablet Take 325 mg by mouth daily with breakfast.    . GLUCOSAMINE-CHONDROITIN PO Take 1,200 mg by mouth daily.      . hydrOXYzine (ATARAX/VISTARIL) 25 MG tablet Take 25 mg by mouth at bedtime as needed for itching (DOESN'T TAKE WITH TRAZODONE).    Marland Kitchen ivabradine (CORLANOR) 5 MG TABS tablet Take 1 tablet (5 mg total) by mouth 2 (two) times daily with a meal. 180 tablet 3  . loratadine (CLARITIN) 10 MG tablet Take 10 mg by mouth daily.    Marland Kitchen losartan (COZAAR) 25 MG tablet Take 1 tablet (25 mg total) by mouth daily. 90 tablet 3  . metolazone (ZAROXOLYN) 2.5 MG tablet Take 1 tablet (2.5 mg total) by mouth daily as needed (edema). 15 tablet 0  . Milk Thistle 150 MG CAPS Take 450 mg by mouth daily.     . Multiple Vitamin (MULTIVITAMIN WITH MINERALS) TABS Take 1 tablet by mouth daily. Reported on 10/13/2015    . omeprazole (PRILOSEC) 40 MG capsule Take 1 capsule (40 mg total) by mouth 2 (two) times daily before a meal. Breakfast and supper 60 capsule 9  . ondansetron (ZOFRAN) 4 MG tablet Take 1 tablet (4 mg total) by mouth every 8 (eight) hours as needed for nausea or vomiting. 20 tablet 3  . PATADAY 0.2 % SOLN Place 1 drop into both eyes daily.     . pentosan polysulfate (ELMIRON) 100 MG capsule Take 200 mg by mouth daily.    . potassium chloride Roy (K-DUR,KLOR-CON) 20 MEQ tablet Take 2 tablets (40 mEq total) by mouth daily. 180 tablet 3  . spironolactone (ALDACTONE) 25 MG tablet TAKE 0.5 TABLETS (12.5 MG TOTAL) BY MOUTH DAILY. 45 tablet 3  . torsemide (DEMADEX) 20 MG tablet TAKE 2 TABLETS (40 MG TOTAL) BY MOUTH DAILY. 60 tablet 6  . traZODone (DESYREL) 50 MG tablet Take 50 mg by mouth as needed. FOR SLEEP     No facility-administered medications prior to visit.     PAST MEDICAL HISTORY: Past Medical History:  Diagnosis Date  . Acne   . Anemia 1980's   [id number]  . Anxiety   . Arthritis   . 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 Blood transfusion 1980's   1987 or 1988  . Breast calcifications    right breast  . Breast cancer (HCC)    right;  . CHF (congestive heart failure) ([place])    due to non-ischemic cardiomyopathy,  thought to be chemotherapy induced;  cath 7/12: normal cors, EF 20-25%. Cardiac MRI 05/2011 EF 32%. ICD implantation 10/2011 ([employer])  .  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 Conversion disorder   . 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 Depression   . Endometriosis   . Fibromyalgia   . GERD (gastroesophageal reflux disease)   . Hepatomegaly   . History of stomach ulcers   . Hyperlipidemia   . Hypertension    c/b orthostatic hypotention  . ICD (implantable cardiac defibrillator) in place   . Insomnia   . Interstitial cystitis   . Left eye injury 12/2014  . Neuropathy due to drug ([place]) 04/27/2013   Chemotherapy induced  Cardiomyopathy, neuropathy, encephalopathy.   . Nonischemic cardiomyopathy ([place])    related to chemo; EF 30% 05/2011  . Obesity   . Palpitation    normal sinus rhythm only on 21 day heart monitor  . Panic attacks   . Peripheral neuropathy    chemo- induced  . Pneumonia 2000's   "once"  . Seasonal allergies   . Type II diabetes mellitus ([place])     PAST SURGICAL HISTORY: Past Surgical History:  Procedure Laterality Date  . ABDOMINAL HYSTERECTOMY     partial  . BREAST EXCISIONAL BIOPSY     right benign 2012  . BREAST LUMPECTOMY  2008; 2013   right radiation  . CARDIAC DEFIBRILLATOR PLACEMENT  11/03/11  . CESAREAN SECTION    . CHOLECYSTECTOMY  ~ 2009  . COLONOSCOPY    . IMPLANTABLE CARDIOVERTER DEFIBRILLATOR IMPLANT N/A 11/03/2011   Procedure: IMPLANTABLE CARDIOVERTER DEFIBRILLATOR IMPLANT;  Surgeon: Deboraha Sprang, MD; MDT   . LAPAROSCOPIC ENDOMETRIOSIS FULGURATION  1990's  . NASAL SINUS SURGERY    . [place] REMOVAL  2010?   left chest; placed in 2008  . TONSILLECTOMY  1980's  . TUBAL LIGATION  1990's    FAMILY HISTORY: Family History  Problem Relation Age of Onset  . Heart disease Mother   . Arthritis Mother   . Hypertension Mother   . Stroke Mother   . Fibromyalgia Mother   . Coronary artery disease Unknown   . Heart  attack Unknown   . Heart disease Maternal Uncle   . Colon cancer Paternal Uncle   . Heart disease Maternal Grandmother   . Ovarian cancer Paternal Grandmother   . Fibromyalgia Sister     SOCIAL HISTORY: Social History   Social History  . Marital status: Married    Spouse name: [name]  . Number of children: 1  . Years of education: 15   Occupational History  . Disablity    Social History Main Topics  . Smoking status: Never Smoker  . Smokeless tobacco: Never Used  . Alcohol use Yes     Comment: social drinker   . Drug use: No  . Sexual activity: Not on file   Other Topics Concern  . Not on file   Social History Narrative   Patient is married [name]) and lives at home with her family.   Patient has one child. A daughter   Patient is right-handed.   Patient has a college education.   Patient drinks some caffeine occasionally, but not everyday.   Regular exercise   She is disabled   02/23/2016   Updated     PHYSICAL EXAM  Vitals:   01/05/17 1123  BP: 115/73  Pulse: 75  Weight: 264 lb (119.7 kg)  Height: 5\' 7"  (1.702 m)   Body mass index is 41.35 kg/m.  Generalized: Well developed, in no acute distress  Head: normocephalic and atraumatic,. Oropharynx benign  Neck: Supple,  Musculoskeletal:  No deformity   Neurological examination   Mentation: Alert oriented to time, place, history taking. Attention span and concentration appropriate. Anxious and worried-  .  Follows all commands.  speech hesitant  , sometimes without the expected cadence, but fluent. Stuttering .   Cranial nerve - She has not experienced changes in sense of taste or smell. Pupils were equal round reactive to light extraocular movements were full, visual field were full on confrontational test.  No nystagmus. Facial sensation and strength were normal. hearing was intact to finger rubbing bilaterally. Uvula tongue midline. head turning and shoulder shrug were normal and symmetric.Tongue  protrusion into cheek strength was normal. Motor: normal bulk and tone, give way weakness on testing bilateral hand grip good  Sensory: Decreased pinprick and vibratory in upper and lower extremities   Coordination: finger-nose-finger intact Reflexes: Symmetric upper and lower, plantar responses were flexor bilaterally. Gait and Station: Rising up from seated position without assistance, atstasia and abasia ,  wide based stance, wide based gait, arms out-streched, slowed, and turning with 4 steps, negative romberg    DIAGNOSTIC DATA (LABS, IMAGING, TESTING) - I reviewed patient records, labs, notes, testing and imaging myself where available.  Lab Results  Component Value Date   WBC 4.1 01/04/2017   HGB 12.8 01/04/2017   HCT 39.3 01/04/2017   MCV 96.7 01/04/2017   PLT 120 (L) 01/04/2017      Component Value Date/Time   NA 138 01/04/2017 1207   K 4.0 01/04/2017 1207   CL 95 (L) 10/28/2016 0851   CO2 30 (H) 01/04/2017 1207   GLUCOSE 84 01/04/2017 1207   BUN 21.4 01/04/2017 1207   CREATININE 1.4 (H) 01/04/2017 1207   CALCIUM 9.4 01/04/2017 1207   PROT 7.9 01/04/2017 1207   ALBUMIN 3.6 01/04/2017 1207   AST 18 01/04/2017 1207   ALT 9 01/04/2017 1207   ALKPHOS 106 01/04/2017 1207   BILITOT 1.70 (H) 01/04/2017 1207   GFRNONAA 60 (L) 10/28/2016 0851   GFRAA >60 10/28/2016 0851    Lab Results  Component Value Date   TSH 3.661 07/20/2016     ASSESSMENT AND PLAN  46 y.o. year old female  has a past medical history of CHF (congestive heart failure); Peripheral neuropathy; Hypertension; Depression; Palpitation; Obesity; Fibromyalgia;  Interstitial cystitis; Anxiety; Nonischemic cardiomyopathy; ICD (implantable cardiac defibrillator) in place;   Type II diabetes mellitus;  Breast cancer; Conversion disorder; COPD with asthma (11/15/2012); Neuropathy due to drug (04/27/2013);   myoclonus here to follow-up.  She had done very well with Depakote as it also helped with anxiety. I will  today consider raising the Depakote dose after reviewing her recent metabolic labs and her CBC. Her liver function tests are normal. After her last visit  Depakote  Was raised to 500 mg twice a day. May raise to 750 mg at night today.  I would also like her to keep taking Cymbalta as it has helped with some of the pain.   The patient was on 12-23-2016  diagnosed with a recurrence of breast cancer in the left breast, this is her third occurrence.  As we speak today the patient developed twisting and turning shoulder and neck movements that are not seizures. She reports being diagnosed with fibromyalgia, and is returning to see rheumatology for the newly diagnosed gout. I will increase Cymbalta. RV in 6 month     Continue Depakote, takes  500 mg in AM and 500 mg at night , 1000 mg daily. Will  increase to 750 mg bid.   Cymbalta- she feels this works , for burning, tingling, numbness- takes this at night. I Increased dose to 30 mg bid, patient will take a higher than FDA approved dose. This treatment has been benficial in Neuropathy patients. I will have NP take lab tests in the next 2 month to follow up.   Cardiac function remains severely impaired. EF 25 % .  Appointment next 1-2 month with Dennie Bible, [facility], [facility], APRN, follow labs.   Larey Seat, MD   [facility] [address] [place], [place] [address] [phone]

## 2017-01-05 NOTE — Telephone Encounter (Signed)
Patient is scheduled with [facility] on 01/31/17 at 2pm. Aware to arrive 15-68mins early to fill out paperwork.  Appt double booked per [facility]. Nothing further needed.

## 2017-01-10 ENCOUNTER — Ambulatory Visit: Payer: Medicare Other | Admitting: Neurology

## 2017-01-11 ENCOUNTER — Encounter: Payer: Self-pay | Admitting: Genetics

## 2017-01-11 NOTE — Telephone Encounter (Signed)
Dr. Caryl Comes called radiology and LM for Dr. Isidore Moos to return call to his cell.

## 2017-01-11 NOTE — Progress Notes (Deleted)
Electrophysiology Office Note Date: 01/11/2017  ID:  [name], DOB 1971-05-03, MRN [id number]  PCP: [place], FNP Primary Cardiologist: [place] Electrophysiologist: Caryl Comes  CC: Routine ICD follow-up  [name] is a 46 y.o. female seen today for Dr Caryl Comes.  She presents today for routine electrophysiology followup.  Since last being seen in our clinic, the patient reports doing very well. She denies chest pain, palpitations, dyspnea, PND, orthopnea, nausea, vomiting, dizziness, syncope, edema, weight gain, or early satiety.  She has not had ICD shocks.   Device History: MDT single chamber ICD implanted 2013 for NICM History of appropriate therapy: No History of AAD therapy: No   Past Medical History:  Diagnosis Date  . Acne   . Anemia 1980's   [id number]  . Anxiety   . Arthritis   . 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 Blood transfusion 1980's   1987 or 1988  . Breast calcifications    right breast  . Breast cancer (HCC)    right;  . CHF (congestive heart failure) ([place])    due to non-ischemic cardiomyopathy, thought to be chemotherapy induced;  cath 7/12: normal cors, EF 20-25%. Cardiac MRI 05/2011 EF 32%. ICD implantation 10/2011 (Medtronic)  . 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 Conversion disorder   . 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 Depression   . Endometriosis   . Fibromyalgia   . GERD (gastroesophageal reflux disease)   . Hepatomegaly   . History of stomach ulcers   . Hyperlipidemia   . Hypertension    c/b orthostatic hypotention  . ICD (implantable cardiac defibrillator) in place   . Insomnia   . Interstitial cystitis   . Left eye injury 12/2014  . 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   . Nonischemic cardiomyopathy ([place])    related to chemo; EF 30% 05/2011  . Obesity   . Palpitation    normal sinus rhythm only on 21 day heart monitor  . Panic attacks   .  Peripheral neuropathy    chemo- induced  . Pneumonia 2000's   "once"  . Seasonal allergies   . Type II diabetes mellitus ([place])    Past Surgical History:  Procedure Laterality Date  . ABDOMINAL HYSTERECTOMY     partial  . BREAST EXCISIONAL BIOPSY     right benign 2012  . BREAST LUMPECTOMY  2008; 2013   right radiation  . CARDIAC DEFIBRILLATOR PLACEMENT  11/03/11  . CESAREAN SECTION    . CHOLECYSTECTOMY  ~ 2009  . COLONOSCOPY    . IMPLANTABLE CARDIOVERTER DEFIBRILLATOR IMPLANT N/A 11/03/2011   Procedure: IMPLANTABLE CARDIOVERTER DEFIBRILLATOR IMPLANT;  Surgeon: Deboraha Sprang, MD; MDT   . LAPAROSCOPIC ENDOMETRIOSIS FULGURATION  1990's  . NASAL SINUS SURGERY    . [place] REMOVAL  2010?   left chest; placed in 2008  . TONSILLECTOMY  1980's  . TUBAL LIGATION  1990's    Current Outpatient Prescriptions  Medication Sig Dispense Refill  . acetaminophen (TYLENOL) 325 MG tablet Take 2 tablets (650 mg total) by mouth every 4 (four) hours as needed for headache or mild pain.    Marland Kitchen albuterol (PROVENTIL HFA;VENTOLIN HFA) 108 (90 Base) MCG/ACT inhaler Inhale 1-2 puffs into the lungs daily.    . baclofen (LIORESAL) 10 MG tablet TAKE 1 TABLET BY MOUTH 2 (TWO) TIMES DAILY AS NEEDED FOR MUSCLE SPASMS. 60 tablet 0  . BLACK CURRANT SEED OIL PO Take 1 tablet by mouth.    Marland Kitchen  carvedilol (COREG) 25 MG tablet Take 0.5 tablets (12.5 mg total) by mouth 2 (two) times daily with a meal. 60 tablet 6  . Cholecalciferol (VITAMIN D3) 5000 UNITS CAPS Take 1 capsule by mouth daily.     . Coenzyme Q10 (CO Q-10) 100 MG CAPS Take 100 mg by mouth daily.    . Cyanocobalamin 2500 MCG SUBL Place 2,500 mcg under the tongue daily.    . digoxin (LANOXIN) 0.125 MG tablet Take 0.5 tablets (0.0625 mg total) by mouth daily. 30 tablet 2  . digoxin (LANOXIN) 0.125 MG tablet TAKE 0.5 TABLETS (0.0625 MG TOTAL) BY MOUTH DAILY. 15 tablet 2  . divalproex (DEPAKOTE ER) 500 MG 24 hr tablet Take 1 tablet in a.m. and 2 tablets in p.m.  90 tablet 5  . DULoxetine (CYMBALTA) 30 MG capsule Take 1 capsule (30 mg total) by mouth 2 (two) times daily. 60 capsule 5  . ferrous sulfate 325 (65 FE) MG tablet Take 325 mg by mouth daily with breakfast.    . GLUCOSAMINE-CHONDROITIN PO Take 1,200 mg by mouth daily.    . hydrOXYzine (ATARAX/VISTARIL) 25 MG tablet Take 25 mg by mouth at bedtime as needed for itching (DOESN'T TAKE WITH TRAZODONE).    Marland Kitchen ivabradine (CORLANOR) 5 MG TABS tablet Take 1 tablet (5 mg total) by mouth 2 (two) times daily with a meal. 180 tablet 3  . loratadine (CLARITIN) 10 MG tablet Take 10 mg by mouth daily.    Marland Kitchen losartan (COZAAR) 25 MG tablet Take 1 tablet (25 mg total) by mouth daily. 90 tablet 3  . metolazone (ZAROXOLYN) 2.5 MG tablet Take 1 tablet (2.5 mg total) by mouth daily as needed (edema). 15 tablet 0  . Milk Thistle 150 MG CAPS Take 450 mg by mouth daily.     . Multiple Vitamin (MULTIVITAMIN WITH MINERALS) TABS Take 1 tablet by mouth daily. Reported on 10/13/2015    . omeprazole (PRILOSEC) 40 MG capsule Take 1 capsule (40 mg total) by mouth 2 (two) times daily before a meal. Breakfast and supper 60 capsule 9  . ondansetron (ZOFRAN) 4 MG tablet Take 1 tablet (4 mg total) by mouth every 8 (eight) hours as needed for nausea or vomiting. 20 tablet 3  . PATADAY 0.2 % SOLN Place 1 drop into both eyes daily.     . pentosan polysulfate (ELMIRON) 100 MG capsule Take 200 mg by mouth daily.    . potassium chloride SA (K-DUR,KLOR-CON) 20 MEQ tablet Take 2 tablets (40 mEq total) by mouth daily. 180 tablet 3  . spironolactone (ALDACTONE) 25 MG tablet TAKE 0.5 TABLETS (12.5 MG TOTAL) BY MOUTH DAILY. 45 tablet 3  . torsemide (DEMADEX) 20 MG tablet TAKE 2 TABLETS (40 MG TOTAL) BY MOUTH DAILY. 60 tablet 6  . traZODone (DESYREL) 50 MG tablet Take 50 mg by mouth as needed. FOR SLEEP     No current facility-administered medications for this visit.     Allergies:   Ace inhibitors; Losartan; Reglan [metoclopramide hcl]; and  Adhesive [tape]   Social History: Social History   Social History  . Marital status: Married    Spouse name: [name]  . Number of children: 1  . Years of education: 56   Occupational History  . Disablity    Social History Main Topics  . Smoking status: Never Smoker  . Smokeless tobacco: Never Used  . Alcohol use Yes     Comment: social drinker   . Drug use: No  . Sexual activity:  Not on file   Other Topics Concern  . Not on file   Social History Narrative   Patient is married [name]) and lives at home with her family.   Patient has one child. A daughter   Patient is right-handed.   Patient has a college education.   Patient drinks some caffeine occasionally, but not everyday.   Regular exercise   She is disabled   02/23/2016   Updated    Family History: Family History  Problem Relation Age of Onset  . Heart disease Mother   . Arthritis Mother   . Hypertension Mother   . Stroke Mother   . Fibromyalgia Mother   . Coronary artery disease Unknown   . Heart attack Unknown   . Heart disease Maternal Uncle   . Colon cancer Paternal Uncle   . Heart disease Maternal Grandmother   . Ovarian cancer Paternal Grandmother   . Fibromyalgia Sister     Review of Systems: All other systems reviewed and are otherwise negative except as noted above.   Physical Exam: VS:  There were no vitals taken for this visit. , BMI There is no height or weight on file to calculate BMI.  GEN- The patient is well appearing, alert and oriented x 3 today.   HEENT: normocephalic, atraumatic; sclera clear, conjunctiva pink; hearing intact; oropharynx clear; neck supple, no JVP Lymph- no cervical lymphadenopathy Lungs- Clear to ausculation bilaterally, normal work of breathing.  No wheezes, rales, rhonchi Heart- Regular rate and rhythm, no murmurs, rubs or gallops, PMI not laterally displaced GI- soft, non-tender, non-distended, bowel sounds present, no hepatosplenomegaly Extremities- no  clubbing, cyanosis, or edema; DP/PT/radial pulses 2+ bilaterally MS- no significant deformity or atrophy Skin- warm and dry, no rash or lesion; ICD pocket well healed Psych- euthymic mood, full affect Neuro- strength and sensation are intact  ICD interrogation- reviewed in detail today,  See PACEART report  EKG:  EKG is ordered today. The ekg ordered today shows ***  Recent Labs: 02/18/2016: B Natriuretic Peptide 274.5; Magnesium 1.8 07/20/2016: TSH 3.661 01/04/2017: ALT 9; BUN 21.4; Creatinine 1.4; HGB 12.8; Platelets 120; Potassium 4.0; Sodium 138   Wt Readings from Last 3 Encounters:  01/05/17 264 lb (119.7 kg)  12/29/16 263 lb 11.2 oz (119.6 kg)  12/22/16 262 lb 3.2 oz (118.9 kg)     Other studies Reviewed: Additional studies/ records that were reviewed today include: ***  Review of the above records today demonstrates: ***  Assessment and Plan:  1.  Chronic systolic dysfunction euvolemic today Stable on an appropriate medical regimen Normal ICD function See Pace Art report No changes today Continue follow up in AHF clinic Enroll in [facility] today  2.  Conversion disorder Followed by neurology  3.  Morbid obesity There is no height or weight on file to calculate BMI. Weight loss encouraged    Current medicines are reviewed at length with the patient today.   The patient does not have concerns regarding her medicines.  The following changes were made today:  none  Labs/ tests ordered today include: none No orders of the defined types were placed in this encounter.    Disposition:   Follow up with [facility], ICM clinic, AHF clinic as scheduled, Dr  Caryl Comes 1 year   Signed, Chanetta Marshall, NP 01/11/2017 5:10 PM  [facility] [address] [place] [address] [phone] (office) [phone] (fax)

## 2017-01-13 ENCOUNTER — Ambulatory Visit (HOSPITAL_BASED_OUTPATIENT_CLINIC_OR_DEPARTMENT_OTHER): Payer: Medicare Other | Admitting: Genetic Counselor

## 2017-01-13 ENCOUNTER — Other Ambulatory Visit (HOSPITAL_BASED_OUTPATIENT_CLINIC_OR_DEPARTMENT_OTHER): Payer: Medicare Other

## 2017-01-13 ENCOUNTER — Encounter: Payer: Self-pay | Admitting: Genetic Counselor

## 2017-01-13 ENCOUNTER — Ambulatory Visit (HOSPITAL_COMMUNITY)
Admission: RE | Admit: 2017-01-13 | Discharge: 2017-01-13 | Disposition: A | Discharge status: Home / Self Care | Payer: Medicare Other | Source: Ambulatory Visit | Attending: Internal Medicine | Admitting: Internal Medicine

## 2017-01-13 ENCOUNTER — Encounter (HOSPITAL_COMMUNITY): Payer: Self-pay | Admitting: Internal Medicine

## 2017-01-13 ENCOUNTER — Encounter: Payer: Self-pay | Admitting: Nurse Practitioner

## 2017-01-13 VITALS — BP 90/66 | HR 67 | Wt 271.4 lb

## 2017-01-13 DIAGNOSIS — M797 Fibromyalgia: Secondary | ICD-10-CM | POA: Diagnosis not present

## 2017-01-13 DIAGNOSIS — F329 Major depressive disorder, single episode, unspecified: Secondary | ICD-10-CM | POA: Diagnosis not present

## 2017-01-13 DIAGNOSIS — F449 Dissociative and conversion disorder, unspecified: Secondary | ICD-10-CM | POA: Diagnosis not present

## 2017-01-13 DIAGNOSIS — Z800 Family history of malignant neoplasm of digestive organs: Secondary | ICD-10-CM | POA: Insufficient documentation

## 2017-01-13 DIAGNOSIS — J449 Chronic obstructive pulmonary disease, unspecified: Secondary | ICD-10-CM | POA: Insufficient documentation

## 2017-01-13 DIAGNOSIS — F419 Anxiety disorder, unspecified: Secondary | ICD-10-CM | POA: Diagnosis not present

## 2017-01-13 DIAGNOSIS — C50912 Malignant neoplasm of unspecified site of left female breast: Secondary | ICD-10-CM | POA: Diagnosis not present

## 2017-01-13 DIAGNOSIS — C50811 Malignant neoplasm of overlapping sites of right female breast: Secondary | ICD-10-CM | POA: Diagnosis not present

## 2017-01-13 DIAGNOSIS — Z170 Estrogen receptor positive status [ER+]: Principal | ICD-10-CM

## 2017-01-13 DIAGNOSIS — Z6841 Body Mass Index (BMI) 40.0 and over, adult: Secondary | ICD-10-CM | POA: Diagnosis not present

## 2017-01-13 DIAGNOSIS — I110 Hypertensive heart disease with heart failure: Secondary | ICD-10-CM | POA: Diagnosis not present

## 2017-01-13 DIAGNOSIS — E1142 Type 2 diabetes mellitus with diabetic polyneuropathy: Secondary | ICD-10-CM | POA: Insufficient documentation

## 2017-01-13 DIAGNOSIS — C50212 Malignant neoplasm of upper-inner quadrant of left female breast: Secondary | ICD-10-CM

## 2017-01-13 DIAGNOSIS — I428 Other cardiomyopathies: Secondary | ICD-10-CM | POA: Diagnosis not present

## 2017-01-13 DIAGNOSIS — M109 Gout, unspecified: Secondary | ICD-10-CM | POA: Diagnosis not present

## 2017-01-13 DIAGNOSIS — E785 Hyperlipidemia, unspecified: Secondary | ICD-10-CM | POA: Insufficient documentation

## 2017-01-13 DIAGNOSIS — Z8041 Family history of malignant neoplasm of ovary: Secondary | ICD-10-CM

## 2017-01-13 DIAGNOSIS — Z95810 Presence of automatic (implantable) cardiac defibrillator: Secondary | ICD-10-CM | POA: Diagnosis not present

## 2017-01-13 DIAGNOSIS — Z8719 Personal history of other diseases of the digestive system: Secondary | ICD-10-CM | POA: Diagnosis not present

## 2017-01-13 DIAGNOSIS — Z7183 Encounter for nonprocreative genetic counseling: Secondary | ICD-10-CM | POA: Diagnosis not present

## 2017-01-13 DIAGNOSIS — Z79899 Other long term (current) drug therapy: Secondary | ICD-10-CM | POA: Insufficient documentation

## 2017-01-13 DIAGNOSIS — I5022 Chronic systolic (congestive) heart failure: Secondary | ICD-10-CM | POA: Diagnosis not present

## 2017-01-13 DIAGNOSIS — K219 Gastro-esophageal reflux disease without esophagitis: Secondary | ICD-10-CM | POA: Diagnosis not present

## 2017-01-13 DIAGNOSIS — Z853 Personal history of malignant neoplasm of breast: Secondary | ICD-10-CM | POA: Insufficient documentation

## 2017-01-13 LAB — COMPREHENSIVE METABOLIC PANEL
ALT: 7 U/L (ref 0–55)
ANION GAP: 10 meq/L (ref 3–11)
AST: 18 U/L (ref 5–34)
Albumin: 3.5 g/dL (ref 3.5–5.0)
Alkaline Phosphatase: 93 U/L (ref 40–150)
BUN: 21 mg/dL (ref 7.0–26.0)
CALCIUM: 8.9 mg/dL (ref 8.4–10.4)
CHLORIDE: 99 meq/L (ref 98–109)
CO2: 29 meq/L (ref 22–29)
CREATININE: 1.3 mg/dL — AB (ref 0.6–1.1)
EGFR: 59 mL/min/{1.73_m2} — AB (ref >90)
Glucose: 87 mg/dl (ref 70–140)
POTASSIUM: 4.1 meq/L (ref 3.5–5.1)
Sodium: 137 mEq/L (ref 136–145)
Total Bilirubin: 1.84 mg/dL — ABNORMAL HIGH (ref 0.20–1.20)
Total Protein: 7.7 g/dL (ref 6.4–8.3)

## 2017-01-13 LAB — CBC WITH DIFFERENTIAL/PLATELET
BASO%: 0.8 % (ref 0.0–2.0)
BASOS ABS: 0 10*3/uL (ref 0.0–0.1)
EOS ABS: 0 10*3/uL (ref 0.0–0.5)
EOS%: 0.8 % (ref 0.0–7.0)
HEMATOCRIT: 39.7 % (ref 34.8–46.6)
HGB: 12.9 g/dL (ref 11.6–15.9)
LYMPH#: 1.4 10*3/uL (ref 0.9–3.3)
LYMPH%: 26.7 % (ref 14.0–49.7)
MCH: 31.7 pg (ref 25.1–34.0)
MCHC: 32.5 g/dL (ref 31.5–36.0)
MCV: 97.5 fL (ref 79.5–101.0)
MONO#: 0.8 10*3/uL (ref 0.1–0.9)
MONO%: 16 % — ABNORMAL HIGH (ref 0.0–14.0)
NEUT#: 2.9 10*3/uL (ref 1.5–6.5)
NEUT%: 55.7 % (ref 38.4–76.8)
PLATELETS: 108 10*3/uL — AB (ref 145–400)
RBC: 4.07 10*6/uL (ref 3.70–5.45)
RDW: 15.9 % — ABNORMAL HIGH (ref 11.2–14.5)
WBC: 5.1 10*3/uL (ref 3.9–10.3)

## 2017-01-13 NOTE — Patient Instructions (Signed)
Please take a dose of METOLAZONE TODAY  Your physician recommends that you schedule a follow-up appointment in: 2-3 months with Dr Haroldine Laws   Do the following things EVERYDAY: 1) Weigh yourself in the morning before breakfast. Write it down and keep it in a log. 2) Take your medicines as prescribed 3) Eat low salt foods-Limit salt (sodium) to 2000 mg per day.  4) Stay as active as you can everyday 5) Limit all fluids for the day to less than 2 liters

## 2017-01-13 NOTE — Progress Notes (Signed)
Patient ID: [name], female   DOB: 27-Aug-1970, 46 y.o.   MRN: [id number]  ADV  Patient ID: [name], female   DOB: 08-Apr-1971, 46 y.o.   MRN: [id number]  Primary Cardiologist:  Dr. Arvilla Meres Neurlogist: [place]   PCP: Dr. Consuella Lose Griffin/Noel Redmon, PAC   History of Present Illness: [name] is a 46 y.o. female with a history of morbid obesity, depression, fibromyalgia, congestive heart failure secondary to nonischemic cardiomyopathy (EF 20-25%) related to her chemotherapy for breast cancer. Last chemo 2008. Evaluated by neuro for stuttering.  Felt to have a conversion disorder. Confirmed by [facility].   She presented to [facility] on 11/26/14 with near syncope and dyspnea.With + orthostatics in ER (SBP 104 to 87 from lying to sitting) She also had volume overload with acute on chronic CHF. BNP 1093.0. Troponins normal. Had 7 lb  gain over 2 days with dietary indiscretion of sodium.Admitted for IV diuretic therapy and monitoring of BP. Coreg reduced 12.5 mg BID. Symptoms improved and good diuresis with IV lasix therapy. I/Os net negative 6 L total. Was transitioned back to PO Torsemide once euvolemic. Discharge weight was 249 lb.  Evaluated by pulmonary and Dr Vassie Loll told he saw no evidence of asthma.    At last visit we reviewed recent CPX performed shows severe HF limitation. We discussed advanced therapies. However in the interim she has been diagnosed with a new breast CA in her left breast. Going to get chemo. Still deciding between lumpectomy vs mastectomy. Very fatigued. SOB with any exertion. Struggling with fibromyalgia. Also found to have elevated uric acid. Taking torsemide 40 daily and then takes metolazone about 1-2x/month.  Gained 5 -10 pounds as she wasn't watching her diet recently.  ICD interrogation done personally in clinic: No VT/VF. Volume going up slightly.  . Activity level about 1-2 hours per day.   Studies:  CPX 4/18   FVC 1.82 (54%)    FEV1 1.15 (42%)      FEV1/FVC 63 (77%)     MVV 58 (54%)  Resting HR: 98 Peak HR: 126  (72% age predicted max HR) BP rest: 86/64 BP peak: 76/60 Peak VO2: 6.4 (38% predicted peak VO2) VE/VCO2 slope: 50 OUES: 0.84 Peak RER: 1.11 Ventilatory Threshold: 5.4 (32% predicted or measured peak VO2) VE/MVV: 71% O2pulse: 6  (55% predicted O2pulse)   CPX Feb 2010. Peak VO2 was 14.5 which was 71% of predicted. When corrected for body weight the VO2 was 20.7. The slope was 27. RER 1.20. O2 pulse 73%. Overall this was felt to be only a very mild functional limitation due to her obesity and mild circulatory limitation.  CPX 2/11: pVO2 13.0 (72%) correct for ideal wt 72ml/kg/min RER 1.06 (submax) slope 28.2 O2 pulse 91% - felt no signifcant cardiac limitation. + deconditioning.   Echo 04/2009  EF back down to 25%.  RHC normal. Echo 07/2009 showed EF 50%  Echo 06/2010 EF 40%. So we did MUGA EF 58% Echo 12/2010 40-45% Echo 05/2011 EF 30%. Grade 2 diastolic dysfunction Cath (12/28/10) was set up and demonstrated EF 20-25% and normal coronary arteries.  RA  4, RV 35/11, PA  28/8 (17), PCWP 8. Fick 5.2/2.4 PVR 1.7 [place]. Fick 5.2/2.4 Aortic saturation was 97%.  PA saturation was 67% and 68%. ECHO 09/03/13 EF 20-25%  ECHO 06/24/14 EF 20-25% Cardiac MRI on 07/19/11: Moderate to severe LVE, Diffuse hypokinesis. EF 32%, Mild LAE ECHO 10/13/15 EF 20-25% Mild to moderate RV dysfunction   Labs  09/03/13  k 3.3 Creatinine 0.86 Pro BNP 998 Dig level 0.7 05/17/14 K 4.7 Creatinine 0.9 pBNP 1007 digoxin 1.2 11/29/14 K 4.2 Creatinine 1.08 Dig 0.3 01/19/16  K 3.7 creatinine 0.90  SH: lived with her husband. Does not drive. Does not drink alcohol or smoke   Past Medical History:  Diagnosis Date  . Acne   . Anemia 1980's   [id number]  . Anxiety   . Arthritis   . 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 Blood transfusion 1980's   1987 or 1988  . Breast calcifications    right breast  . Breast cancer (HCC)    right;  . CHF (congestive heart  failure) (HCC)    due to non-ischemic cardiomyopathy, thought to be chemotherapy induced;  cath 7/12: normal cors, EF 20-25%. Cardiac MRI 05/2011 EF 32%. ICD implantation 10/2011 (Medtronic)  . 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 Conversion disorder   . 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 Depression   . Endometriosis   . Family history of colon cancer   . Family history of ovarian cancer   . Fibromyalgia   . GERD (gastroesophageal reflux disease)   . Hepatomegaly   . History of stomach ulcers   . Hyperlipidemia   . Hypertension    c/b orthostatic hypotention  . ICD (implantable cardiac defibrillator) in place   . Insomnia   . Interstitial cystitis   . Left eye injury 12/2014  . 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   . Nonischemic cardiomyopathy (HCC)    related to chemo; EF 30% 05/2011  . Obesity   . Palpitation    normal sinus rhythm only on 21 day heart monitor  . Panic attacks   . Peripheral neuropathy    chemo- induced  . Pneumonia 2000's   "once"  . Seasonal allergies   . Type II diabetes mellitus (HCC)    Current Outpatient Prescriptions on File Prior to Encounter  Medication Sig Dispense Refill  . acetaminophen (TYLENOL) 325 MG tablet Take 2 tablets (650 mg total) by mouth every 4 (four) hours as needed for headache or mild pain.    Marland Kitchen albuterol (PROVENTIL HFA;VENTOLIN HFA) 108 (90 Base) MCG/ACT inhaler Inhale 1-2 puffs into the lungs daily.    . baclofen (LIORESAL) 10 MG tablet TAKE 1 TABLET BY MOUTH 2 (TWO) TIMES DAILY AS NEEDED FOR MUSCLE SPASMS. 60 tablet 0  . BLACK CURRANT SEED OIL PO Take 1 tablet by mouth.    . carvedilol (COREG) 25 MG tablet Take 0.5 tablets (12.5 mg total) by mouth 2 (two) times daily with a meal. 60 tablet 6  . Cholecalciferol (VITAMIN D3) 5000 UNITS CAPS Take 1 capsule by mouth daily.     . Coenzyme Q10 (CO Q-10) 100 MG CAPS Take 100 mg by mouth daily.     . Cyanocobalamin 2500 MCG SUBL Place 2,500 mcg under the tongue daily.    . digoxin (LANOXIN) 0.125 MG tablet Take 0.5 tablets (0.0625 mg total) by mouth daily. 30 tablet 2  . digoxin (LANOXIN) 0.125 MG tablet TAKE 0.5 TABLETS (0.0625 MG TOTAL) BY MOUTH DAILY. 15 tablet 2  . divalproex (DEPAKOTE ER) 500 MG 24 hr tablet Take 1 tablet in a.m. and 2 tablets in p.m. 90 tablet 5  . DULoxetine (CYMBALTA) 30 MG capsule Take 1 capsule (30 mg total) by mouth 2 (two) times daily. 60 capsule 5  . ferrous sulfate 325 (65 FE) MG tablet Take 325 mg by  mouth daily with breakfast.    . GLUCOSAMINE-CHONDROITIN PO Take 1,200 mg by mouth daily.    . hydrOXYzine (ATARAX/VISTARIL) 25 MG tablet Take 25 mg by mouth at bedtime as needed for itching (DOESN'T TAKE WITH TRAZODONE).    Marland Kitchen ivabradine (CORLANOR) 5 MG TABS tablet Take 1 tablet (5 mg total) by mouth 2 (two) times daily with a meal. 180 tablet 3  . loratadine (CLARITIN) 10 MG tablet Take 10 mg by mouth daily.    Marland Kitchen losartan (COZAAR) 25 MG tablet Take 1 tablet (25 mg total) by mouth daily. 90 tablet 3  . metolazone (ZAROXOLYN) 2.5 MG tablet Take 1 tablet (2.5 mg total) by mouth daily as needed (edema). 15 tablet 0  . Milk Thistle 150 MG CAPS Take 450 mg by mouth daily.     . Multiple Vitamin (MULTIVITAMIN WITH MINERALS) TABS Take 1 tablet by mouth daily. Reported on 10/13/2015    . omeprazole (PRILOSEC) 40 MG capsule Take 1 capsule (40 mg total) by mouth 2 (two) times daily before a meal. Breakfast and supper 60 capsule 9  . ondansetron (ZOFRAN) 4 MG tablet Take 1 tablet (4 mg total) by mouth every 8 (eight) hours as needed for nausea or vomiting. 20 tablet 3  . PATADAY 0.2 % SOLN Place 1 drop into both eyes daily.     . pentosan polysulfate (ELMIRON) 100 MG capsule Take 200 mg by mouth daily.    . potassium chloride SA (K-DUR,KLOR-CON) 20 MEQ tablet Take 2 tablets (40 mEq total) by mouth daily. 180 tablet 3  . spironolactone (ALDACTONE) 25 MG tablet TAKE 0.5  TABLETS (12.5 MG TOTAL) BY MOUTH DAILY. 45 tablet 3  . torsemide (DEMADEX) 20 MG tablet TAKE 2 TABLETS (40 MG TOTAL) BY MOUTH DAILY. 60 tablet 6  . traZODone (DESYREL) 50 MG tablet Take 50 mg by mouth as needed. FOR SLEEP     No current facility-administered medications on file prior to encounter.      Allergies  Allergen Reactions  . Ace Inhibitors Other (See Comments)    hyperkalemia  . Losartan Other (See Comments)    hyperkalemia  . Reglan [Metoclopramide Hcl] Anxiety  . Adhesive [Tape] Itching and Rash   ROS: All pertinent positives or negatives as in HPI otherwise negative   Vital Signs: Vitals:   01/13/17 1400  BP: 90/66  Pulse: 67  SpO2: 96%  Weight: 271 lb 6.4 oz (123.1 kg)   Wt Readings from Last 3 Encounters:  01/13/17 271 lb 6.4 oz (123.1 kg)  01/05/17 264 lb (119.7 kg)  12/29/16 263 lb 11.2 oz (119.6 kg)   Body mass index is 42.51 kg/m.  PHYSICAL EXAM: General:  Fatigued appearing. No resp difficulty HEENT: normal Neck: supple. JVP hard to see. Probably mildly elevated. Carotids 2+ bilat; no bruits. No lymphadenopathy or thryomegaly appreciated. Cor: PMI non palpable. Regular rate & rhythm. 2/6 TR no s3 Lungs: clear Abdomen: obese soft, nontender, nondistended. No hepatosplenomegaly. No bruits or masses. Good bowel sounds. Extremities: no cyanosis, clubbing, rash, 1+ edema Neuro: alert & orientedx3, cranial nerves grossly intact. moves all 4 extremities w/o difficulty. Affect pleasant    ASSESSMENT AND PLAN: 1. Chronic systolic CHF: Nonischemic cardiomyopathy.  Medtronic ICD. Echo 5/17 EF 20-25% with moderately dilated LV.   --Chronic NYHA IIIB. Volume status mildly elevated on exam and Optivol.  --CPX from 4/18 shows severe HF limitation - significantly decreased from previous - but was done on a day when she had some fluid on  board. Given BMI (41) and comorbidities she is not ideal candidate for advanced therapies. Marland Kitchen --ICD interrogated personally. No  VT/AF. Volume status trending up. Activity 1-2 hr/day  - Volume status looks good on torsemide 40 daily. Continue to use metolazone as needed - On carvedilol  12.5 mg twice a day. Dose previously reduced due to orthostasis.  - On dig 0.0625 mg daily. - Continue spiro - Continuee losartan 25mg  daily  - Continue ivabradine 5 bid.  - BP too low for Entresto.  - Check labs today  - Given recurrent breast CA she will not be candidate for transplant. With comorbidities would also not be approved for VAD.  2. Breast cancer - Currently being worked up. Pending lumpectomy vs mastectomy - She will be high-risk for surgery but this is not prohibitive - Discussed at length  3. Conversion disorder: Followed by neurology  4. Fibromyalgia/gout - Will f/u with Dr. Titus Dubin 5. Morbid obesity:  Taunia Frasco,MD 2:23 PM

## 2017-01-13 NOTE — Progress Notes (Signed)
REFERRING PROVIDER: Chauncey Cruel, MD [place], [place] [address]  PRIMARY PROVIDER:  Golden Circle, FNP  PRIMARY REASON FOR VISIT:  1. Malignant neoplasm of overlapping sites of right breast in female, estrogen receptor positive ([place])   2. Family history of ovarian cancer   3. Family history of colon cancer   4. Malignant neoplasm of upper-inner quadrant of left breast in female, estrogen receptor positive ([place])      HISTORY OF PRESENT ILLNESS:   [name], a 46 y.o. female, was seen for a [place] cancer genetics consultation at the request of Dr. Jana Hakim due to a personal and family history of cancer.  [name] presents to clinic today to discuss the possibility of a hereditary predisposition to cancer, genetic testing, and to further clarify her future cancer risks, as well as potential cancer risks for family members.   In 2009, at the age of 82, [name] was diagnosed with invasive ductal carcinoma of the right breast.  The tumor was ER+/PR+/Her2-. This was treated with lumpectomy, chemotherapy, and radiation.  She underwent genetic testing at that time which was reportedly negative.  In 2018, at the age of 43, [name] was diagnosed with invasive ductal carcinoma of the left breast.  The tumor is ER+/PR-/Her2-.  Treatment will be determined based on genetic testing results.    CANCER HISTORY:    Breast cancer, right breast ([place])   05/29/2014 Initial Diagnosis    Breast cancer, right breast ([place])       HORMONAL RISK FACTORS:  Menarche was at age 89.  First live birth at age 27.  OCP use for approximately 11 years.  Ovaries intact: no.  Hysterectomy: yes.  Menopausal status: postmenopausal.  HRT use: 0 years. Colonoscopy: yes; normal. Mammogram within the last year: yes. Number of breast biopsies: 3. Up to date with pelvic exams:  yes. Any excessive radiation exposure in the past:  For breast cancer treatment  Past Medical History:   Diagnosis Date  . Acne   . Anemia 1980's   [id number]  . Anxiety   . Arthritis   . 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 Blood transfusion 1980's   1987 or 1988  . Breast calcifications    right breast  . Breast cancer (HCC)    right;  . 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 Cardiac MRI 05/2011 EF 32%. ICD implantation 10/2011 ([employer])  . 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 Conversion disorder   . 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 Depression   . Endometriosis   . Family history of colon cancer   . Family history of ovarian cancer   . Fibromyalgia   . GERD (gastroesophageal reflux disease)   . Hepatomegaly   . History of stomach ulcers   . Hyperlipidemia   . Hypertension    c/b orthostatic hypotention  . ICD (implantable cardiac defibrillator) in place   . Insomnia   . Interstitial cystitis   . Left eye injury 12/2014  . 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   . Nonischemic cardiomyopathy ([place])    related to chemo; EF 30% 05/2011  . Obesity   . Palpitation    normal sinus rhythm only on 21 day heart monitor  . Panic attacks   . Peripheral neuropathy    chemo- induced  .  Pneumonia 2000's   "once"  . Seasonal allergies   . Type II diabetes mellitus ([place])     Past Surgical History:  Procedure Laterality Date  . ABDOMINAL HYSTERECTOMY     partial  . BREAST EXCISIONAL BIOPSY     right benign 2012  . BREAST LUMPECTOMY  2008; 2013   right radiation  . CARDIAC DEFIBRILLATOR PLACEMENT  11/03/11  . CESAREAN SECTION    . CHOLECYSTECTOMY  ~ 2009  . COLONOSCOPY    . IMPLANTABLE CARDIOVERTER DEFIBRILLATOR IMPLANT N/A 11/03/2011   Procedure: IMPLANTABLE CARDIOVERTER DEFIBRILLATOR IMPLANT;  Surgeon: Deboraha Sprang, MD; MDT   . LAPAROSCOPIC ENDOMETRIOSIS FULGURATION  1990's  . NASAL SINUS  SURGERY    . [place] REMOVAL  2010?   left chest; placed in 2008  . TONSILLECTOMY  1980's  . TUBAL LIGATION  1990's    Social History   Social History  . Marital status: Married    Spouse name: [name]  . Number of children: 1  . Years of education: 84   Occupational History  . Disablity    Social History Main Topics  . Smoking status: Never Smoker  . Smokeless tobacco: Never Used  . Alcohol use Yes     Comment: social drinker   . Drug use: No  . Sexual activity: Not Asked   Other Topics Concern  . None   Social History Narrative   Patient is married [name]) and lives at home with her family.   Patient has one child. A daughter   Patient is right-handed.   Patient has a college education.   Patient drinks some caffeine occasionally, but not everyday.   Regular exercise   She is disabled   02/23/2016   Updated     FAMILY HISTORY:  We obtained a detailed, 4-generation family history.  Significant diagnoses are listed below: Family History  Problem Relation Age of Onset  . Heart disease Mother   . Arthritis Mother   . Hypertension Mother   . Stroke Mother   . Fibromyalgia Mother   . Coronary artery disease Unknown   . Heart attack Unknown   . Heart disease Maternal Uncle   . Colon cancer Paternal Uncle        dx over 90  . Heart disease Maternal Grandmother   . Heart attack Maternal Grandmother   . Ovarian cancer Paternal Grandmother   . Fibromyalgia Sister   . Ovarian cancer Paternal Aunt     The patient has one daughter who is cancer free.  She has a maternal half sister and three paternal half brothers, none of whom have cancer.  Both parents are living.  She met her father at age 79, and does not think he has had cancer.  Her mother is cancer free, but had a TAH for bleeding issue.  The patient's mother has maternal half siblings - one sister and four brothers. One brother had an unknown form of cancer.  The patient's grandmother died of a heart  attack and there is no information on her grandfather.  The patient's father had two sisters and two brothers.  One sister died of ovarian cancer and one brother died of colon cancer that was diagnosed over age 13.  Her paternal grandmother also had ovarian cancer in her 27's.  [name] is unaware of previous family history of genetic testing for hereditary cancer risks. Patient's maternal ancestors are of African American descent, and paternal ancestors are of African American descent. There is  no reported Ashkenazi Jewish ancestry. There is no known consanguinity.  GENETIC COUNSELING ASSESSMENT: [name] is a 46 y.o. female with a personal history of bilateral, early onset breast caner and family history of ovarian and colon cancer which is somewhat suggestive of a hereditary cancer syndrome and predisposition to cancer. We, therefore, discussed and recommended the following at today's visit.   DISCUSSION: We discussed that about 5-10% of breast cancer is hereditary, with most cases due to BRCA mutations. She was negative for BRCA mutations in 2008.  The most likely situation is that she will be negative again, but a small proportion of individuals will test positive for mutations that we were unable to test for in the past. Additionally, there are other genes that are associated with breast cancer including ATM, CHEK2 and PALB2.   We reviewed the characteristics, features and inheritance patterns of hereditary cancer syndromes. We also discussed genetic testing, including the appropriate family members to test, the process of testing, insurance coverage and turn-around-time for results. We discussed the implications of a negative, positive and/or variant of uncertain significant result. We recommended [name] pursue genetic testing for the common hereditary cancer gene panel. The Hereditary Gene Panel offered by [facility] includes sequencing and/or deletion duplication testing of the following 46  genes: APC, ATM, AXIN2, BARD1, BMPR1A, BRCA1, BRCA2, BRIP1, CDH1, CDKN2A (p14ARF), CDKN2A (p16INK4a), CHEK2, CTNNA1, DICER1, EPCAM (Deletion/duplication testing only), GREM1 (promoter region deletion/duplication testing only), KIT, MEN1, MLH1, MSH2, MSH3, MSH6, MUTYH, NBN, NF1, NHTL1, PALB2, PDGFRA, PMS2, POLD1, POLE, PTEN, RAD50, RAD51C, RAD51D, SDHB, SDHC, SDHD, SMAD4, SMARCA4. STK11, TP53, TSC1, TSC2, and VHL.  The following genes were evaluated for sequence changes only: SDHA and HOXB13 c.251G>A variant only.  Based on [name] personal and family history of cancer, she meets medical criteria for genetic testing. Despite that she meets criteria, she may still have an out of pocket cost. We discussed that if her out of pocket cost for testing is over $100, the laboratory will call and confirm whether she wants to proceed with testing.  If the out of pocket cost of testing is less than $100 she will be billed by the genetic testing laboratory.   PLAN: After considering the risks, benefits, and limitations, [name]  provided informed consent to pursue genetic testing and the blood sample was sent to [facility] for analysis of the Common Hereditary Cancer panel. Results should be available within approximately 2-3 weeks' time, at which point they will be disclosed by telephone to [name], as will any additional recommendations warranted by these results. [name] will receive a summary of her genetic counseling visit and a copy of her results once available. This information will also be available in Epic. We encouraged [name] to remain in contact with cancer genetics annually so that we can continuously update the family history and inform her of any changes in cancer genetics and testing that may be of benefit for her family. [name] questions were answered to her satisfaction today. Our contact information was provided should additional questions or concerns arise.  Lastly, we  encouraged [name] to remain in contact with cancer genetics annually so that we can continuously update the family history and inform her of any changes in cancer genetics and testing that may be of benefit for this family.   Ms.  Roy questions were answered to her satisfaction today. Our contact information was provided should additional questions or concerns arise. Thank you for the referral  and allowing [place] to share in the care of your patient.   Jeffie Spivack P. Florene Glen, [place], [facility] Certified Genetic Counselor [email or web address] phone: [phone]  The patient was seen for a total of 50 minutes in face-to-face genetic counseling.  This patient was discussed with Drs. Magrinat, Lindi Adie and/or Burr Medico who agrees with the above.    _______________________________________________________________________ For Office Staff:  Number of people involved in session: 2 Was an Intern/ student involved with case: no

## 2017-01-14 ENCOUNTER — Other Ambulatory Visit: Payer: Self-pay | Admitting: Neurology

## 2017-01-14 ENCOUNTER — Other Ambulatory Visit: Payer: Self-pay | Admitting: General Surgery

## 2017-01-14 ENCOUNTER — Encounter: Payer: Self-pay | Admitting: Nurse Practitioner

## 2017-01-14 DIAGNOSIS — Z853 Personal history of malignant neoplasm of breast: Secondary | ICD-10-CM | POA: Diagnosis not present

## 2017-01-14 DIAGNOSIS — J449 Chronic obstructive pulmonary disease, unspecified: Secondary | ICD-10-CM | POA: Diagnosis not present

## 2017-01-14 DIAGNOSIS — M797 Fibromyalgia: Secondary | ICD-10-CM | POA: Diagnosis not present

## 2017-01-14 DIAGNOSIS — F449 Dissociative and conversion disorder, unspecified: Secondary | ICD-10-CM | POA: Diagnosis not present

## 2017-01-14 DIAGNOSIS — Z8742 Personal history of other diseases of the female genital tract: Secondary | ICD-10-CM | POA: Diagnosis not present

## 2017-01-14 DIAGNOSIS — C50212 Malignant neoplasm of upper-inner quadrant of left female breast: Secondary | ICD-10-CM | POA: Diagnosis not present

## 2017-01-14 DIAGNOSIS — F0789 Other personality and behavioral disorders due to known physiological condition: Secondary | ICD-10-CM | POA: Diagnosis not present

## 2017-01-14 DIAGNOSIS — Z95810 Presence of automatic (implantable) cardiac defibrillator: Secondary | ICD-10-CM | POA: Diagnosis not present

## 2017-01-14 DIAGNOSIS — Z87898 Personal history of other specified conditions: Secondary | ICD-10-CM | POA: Diagnosis not present

## 2017-01-14 DIAGNOSIS — F09 Unspecified mental disorder due to known physiological condition: Secondary | ICD-10-CM | POA: Diagnosis not present

## 2017-01-14 DIAGNOSIS — I5022 Chronic systolic (congestive) heart failure: Secondary | ICD-10-CM | POA: Diagnosis not present

## 2017-01-14 DIAGNOSIS — I10 Essential (primary) hypertension: Secondary | ICD-10-CM | POA: Diagnosis not present

## 2017-01-20 NOTE — Telephone Encounter (Signed)
Mastectomy v. Lumpectomy still being considered by genetic counselor/ oncology. Will re-evaluate need for ICD management/ relocation once Ms. Sheen has a more solidified plan of care. ICD likely to need relocation per Dr. Caryl Comes.

## 2017-01-21 ENCOUNTER — Other Ambulatory Visit: Payer: Self-pay | Admitting: Internal Medicine

## 2017-01-21 ENCOUNTER — Telehealth: Payer: Self-pay | Admitting: Internal Medicine

## 2017-01-21 NOTE — Telephone Encounter (Signed)
I called and spoke with Colletta Maryland at [facility]- she is scheduled for radiation to her left breast and will require repositioning of her ICD.   I advised Colletta Maryland, after reviewing with Dr. Caryl Comes, that we were waiting on a date from Dr. Dalbert Batman that he could perform the patient procedure next week as Dr. Caryl Comes had advised him he would be in the lab 8/13-8/16 next week.   Per Colletta Maryland, Wednesday 8/15 may be a tentative date for this- I advised we could put the patient on for 10 am with Dr. Caryl Comes to reposition her device. She will call us back on Monday to confirm as she will need to check on a few preliminary things the patient will need to have.   Colletta Maryland is aware I am out Monday, but I have asked her to ask for Sherri to assist with this if needed.   I also left a message on the patient's voice mail (ok per DPR form) to update her as to what is going on.

## 2017-01-21 NOTE — Telephone Encounter (Signed)
Follow up  Pt calling and wants to know if someone could return her call and also [facility] phone call

## 2017-01-21 NOTE — Telephone Encounter (Signed)
Colletta Maryland from [facility] called and would like to discuss surgery scheduled for [name] [id number].  Please call her back at [phone].

## 2017-01-25 ENCOUNTER — Encounter: Payer: Self-pay | Admitting: *Deleted

## 2017-01-25 ENCOUNTER — Other Ambulatory Visit: Payer: Self-pay | Admitting: General Surgery

## 2017-01-25 DIAGNOSIS — Z170 Estrogen receptor positive status [ER+]: Principal | ICD-10-CM

## 2017-01-25 DIAGNOSIS — C50212 Malignant neoplasm of upper-inner quadrant of left female breast: Secondary | ICD-10-CM

## 2017-01-25 NOTE — Telephone Encounter (Signed)
Spoke with Colletta Maryland at [place] today- per Dr. Dalbert Batman, ok to wait for procedure until Dr. Caryl Comes is back after Labor day. Will perform procedure 02/21/17 with Dr. Caryl Comes and Dr. Dalbert Batman.

## 2017-01-25 NOTE — Telephone Encounter (Signed)
I spoke with the patient in regards to her procedure with Dr. Caryl Comes- I advised her that this is scheduled for 02/21/17 at 7:30 am. Letter of instructions reviewed with the patient and copy mailed. She will come pick up her surgical scrub in the office.

## 2017-01-31 ENCOUNTER — Institutional Professional Consult (permissible substitution): Payer: Self-pay | Admitting: Pulmonary Disease

## 2017-02-01 ENCOUNTER — Ambulatory Visit (HOSPITAL_BASED_OUTPATIENT_CLINIC_OR_DEPARTMENT_OTHER): Payer: Medicare Other | Admitting: Oncology

## 2017-02-01 ENCOUNTER — Other Ambulatory Visit (HOSPITAL_BASED_OUTPATIENT_CLINIC_OR_DEPARTMENT_OTHER): Payer: Medicare Other

## 2017-02-01 ENCOUNTER — Ambulatory Visit: Payer: Medicare Other

## 2017-02-01 VITALS — BP 84/50 | HR 66 | Temp 98.2°F | Resp 20 | Ht 67.0 in | Wt 255.5 lb

## 2017-02-01 DIAGNOSIS — C50212 Malignant neoplasm of upper-inner quadrant of left female breast: Secondary | ICD-10-CM

## 2017-02-01 DIAGNOSIS — Z170 Estrogen receptor positive status [ER+]: Principal | ICD-10-CM

## 2017-02-01 DIAGNOSIS — C50911 Malignant neoplasm of unspecified site of right female breast: Secondary | ICD-10-CM

## 2017-02-01 DIAGNOSIS — C50811 Malignant neoplasm of overlapping sites of right female breast: Secondary | ICD-10-CM

## 2017-02-01 LAB — CBC WITH DIFFERENTIAL/PLATELET
BASO%: 1.3 % (ref 0.0–2.0)
Basophils Absolute: 0.1 10*3/uL (ref 0.0–0.1)
EOS%: 1.7 % (ref 0.0–7.0)
Eosinophils Absolute: 0.1 10*3/uL (ref 0.0–0.5)
HCT: 48.9 % — ABNORMAL HIGH (ref 34.8–46.6)
HGB: 16 g/dL — ABNORMAL HIGH (ref 11.6–15.9)
LYMPH%: 27.5 % (ref 14.0–49.7)
MCH: 31.5 pg (ref 25.1–34.0)
MCHC: 32.8 g/dL (ref 31.5–36.0)
MCV: 96.1 fL (ref 79.5–101.0)
MONO#: 1 10*3/uL — ABNORMAL HIGH (ref 0.1–0.9)
MONO%: 13.7 % (ref 0.0–14.0)
NEUT#: 3.9 10*3/uL (ref 1.5–6.5)
NEUT%: 55.8 % (ref 38.4–76.8)
Platelets: 152 10*3/uL (ref 145–400)
RBC: 5.09 10*6/uL (ref 3.70–5.45)
RDW: 15.6 % — ABNORMAL HIGH (ref 11.2–14.5)
WBC: 7.1 10*3/uL (ref 3.9–10.3)
lymph#: 1.9 10*3/uL (ref 0.9–3.3)

## 2017-02-01 LAB — COMPREHENSIVE METABOLIC PANEL
ALBUMIN: 3.6 g/dL (ref 3.5–5.0)
ALK PHOS: 111 U/L (ref 40–150)
ALT: 9 U/L (ref 0–55)
ANION GAP: 10 meq/L (ref 3–11)
AST: 20 U/L (ref 5–34)
BILIRUBIN TOTAL: 2.16 mg/dL — AB (ref 0.20–1.20)
BUN: 23.2 mg/dL (ref 7.0–26.0)
CO2: 31 meq/L — AB (ref 22–29)
Calcium: 10.4 mg/dL (ref 8.4–10.4)
Chloride: 94 mEq/L — ABNORMAL LOW (ref 98–109)
Creatinine: 1.3 mg/dL — ABNORMAL HIGH (ref 0.6–1.1)
EGFR: 58 mL/min/{1.73_m2} — AB (ref >90)
GLUCOSE: 89 mg/dL (ref 70–140)
POTASSIUM: 4 meq/L (ref 3.5–5.1)
SODIUM: 134 meq/L — AB (ref 136–145)
TOTAL PROTEIN: 9 g/dL — AB (ref 6.4–8.3)

## 2017-02-01 MED ORDER — LETROZOLE 2.5 MG PO TABS: 2.5000 mg | ORAL_TABLET | Freq: Every day | ORAL | 4 refills | Status: AC

## 2017-02-01 NOTE — Patient Instructions (Signed)

## 2017-02-01 NOTE — Progress Notes (Signed)
[name]  [phone] [phone]) [phone]     ID: [name] DOB: 1970-12-15  MR#: [id number]  NFA#:[id number]  Patient Care Team: Mellody Dance, DO as PCP - General (Family Medicine) Bensimhon, Shaune Pascal, MD as Consulting Physician (Cardiology) Magrinat, Virgie Dad, MD as Consulting Physician (Oncology) Deboraha Sprang, MD as Consulting Physician (Cardiology) Dohmeier, Asencion Partridge, MD as Consulting Physician (Neurology) Chesley Mires, MD as Consulting Physician (Pulmonary Disease) Fanny Skates, MD as Consulting Physician (General Surgery) Eppie Gibson, MD as Attending Physician (Radiation Oncology) PCP: Mellody Dance, DO GYN: Ena Dawley SU:  OTHER MD: Glori Bickers, Arloa Koh, Juanetta Gosling Dohmeier  CHIEF COMPLAINT: Contralateral estrogen receptor positive breast cancer  CURRENT TREATMENT: Awaiting definitive surgery   INTERVAL HISTORY: [name] returns today for follow-up of her estrogen receptor positive breast cancer. She is receiving fulvestrant neoadjuvantly, while surgery (which is high risk) has been scheduled for 02/21/2017. She received her first fulvestrant dose 4 weeks ago.  She did very poorly with that. She tells me she is struggling. She tells me she is always tired, fatigued and has no energy because of her congestive heart failure but now the problem is even worse. She tells me she always hurts all over because of fibromyalgia and chronic gouty arthritis but she is now hurting even more than before. In short she thinks the fulvestrant is not going to be her medication. Accordingly we canceled her treatment due today  REVIEW OF SYSTEMS:  A detailed review of systems today was otherwise stable.  BREAST CANCER HISTORY:  from Dr. Collier Salina Rubin's original intake note dated 08/31/2006   "This woman has been in good health.  She had a screening mammogram done via mobile mammogram unit.  A baseline mammogram did show a  lesion or suspicious pigmented area at 9 o'clock in the right breast.  [facility]  A digital right diagnostic mammogram and ultrasound was done on 08/01/2006.  Physical examination did palpate a 1.5 cm mass at 9 o'clock position 7 cm from the nipple.  Ultrasound showed this to be 1.5 x 1.3 x 1.0 cm.  Biopsy was recommended.  Biopsy on 08/01/2006 showed invasive adenocarcinoma associated with DCIS, which appeared to be high-grade.  ER and PR were positive at 99% and 98% respectively.  Proliferative index was 16%.  HER-2/neu was 1+.  The patient underwent a lumpectomy and sentinel lymph node evaluation on 08/22/2006.  Prior to that, a MRI of both breasts was done on 08/10/2006, which documented a 2.4 cm spiculated mass in the breast.  No other abnormalities were seen.  The lumpectomy and sentinel lymph node evaluation on 08/22/2006 showed a 2.6 cm, grade 3/3 invasive ductal cancer, lymphovascular invasion was seen focally.  Surgical margins were clear.  A total of five lymph nodes were removed at the time of sentinel node, one of which was involved with malignancy.  The patient has had an unremarkable postoperative course."  Her subsequent history is detailed below   HISTORY: OF LEFT (CONTRALATERAL) BREAST CANCER [name] has a history of right-sided breast cancer dating back 10 years. She missed her last 2 appointments with me as "no shows". The last time I saw this patient was in 2016.  More recently she noted a mass in her right breast. She underwent bilateral diagnostic mammography with tomography at the [facility] 12/07/2016 found a breast density to be category B. In the right breast there were only postsurgical changes and no suspicious findings.  Ultrasound of the right breast showed an area consistent with a 9 fat necrosis in the 4:00 axis 5 cm from the nipple, measuring 1.8 cm. Biopsy of this area 12/27/2016 confirmed fat necrosis, with no evidence of  malignancy ([id number]).  In the left breast however there was an irregular mass associated with architectural distortion, altogether measuring approximately 0.7 centimeters. Ultrasound of the left breast confirmed an irregular hypoechoic mass at the 9:30 o'clock axis 6 cm from the nipple measuring 0.8 cm. The left axilla was sonographically benign.  Biopsy of the left breast mass in question 12/07/2016 found ([id number]) an invasive ductal carcinoma, grade 2, estrogen receptor 100% positive, with strong staining intensity, progesterone receptor negative, with an MIB-1 of 10%, and no HER-2 amplification, the signals ratio being 1.24 and the number per cell 2.05.  This case was discussed at the multidisciplinary breast cancer conference 12/22/2016. At that point a preliminary plan was suggested: Breast conserving surgery with sentinel lymph node sampling followed by radiation versus consideration of bilateral mastectomies, the concern of course being the patient's congestive heart failure. Anti-estrogens would follow at the completion of local treatment. A full genetics panel was also suggested.  The patient's subsequent history is as detailed below.   PAST MEDICAL HISTORY: Past Medical History:  Diagnosis Date  . Acne   . Anemia 1980's   [id number]  . Anxiety   . Arthritis   . 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 Blood transfusion 1980's   1987 or 1988  . Breast calcifications    right breast  . Breast cancer (HCC)    right;  . CHF (congestive heart failure) ([place])    due to non-ischemic cardiomyopathy, thought to be chemotherapy induced;  cath 7/12: normal cors, EF 20-25%. Cardiac MRI 05/2011 EF 32%. ICD implantation 10/2011 ([employer])  . 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 Conversion disorder   . 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 Depression   . Endometriosis   . Family history of colon cancer   . Family history of ovarian cancer   . Fibromyalgia   . GERD (gastroesophageal  reflux disease)   . Hepatomegaly   . History of stomach ulcers   . Hyperlipidemia   . Hypertension    c/b orthostatic hypotention  . ICD (implantable cardiac defibrillator) in place   . Insomnia   . Interstitial cystitis   . Left eye injury 12/2014  . 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   . Nonischemic cardiomyopathy ([place])    related to chemo; EF 30% 05/2011  . Obesity   . Palpitation    normal sinus rhythm only on 21 day heart monitor  . Panic attacks   . Peripheral neuropathy    chemo- induced  . Pneumonia 2000's   "once"  . Seasonal allergies   . Type II diabetes mellitus ([place])     PAST SURGICAL HISTORY: Past Surgical History:  Procedure Laterality Date  . ABDOMINAL HYSTERECTOMY     partial  . BREAST EXCISIONAL BIOPSY     right benign 2012  . BREAST LUMPECTOMY  2008; 2013   right radiation  . CARDIAC DEFIBRILLATOR PLACEMENT  11/03/11  . CESAREAN SECTION    . CHOLECYSTECTOMY  ~ 2009  . COLONOSCOPY    . IMPLANTABLE CARDIOVERTER DEFIBRILLATOR IMPLANT N/A 11/03/2011   Procedure: IMPLANTABLE CARDIOVERTER DEFIBRILLATOR IMPLANT;  Surgeon: Deboraha Sprang, MD; MDT   . LAPAROSCOPIC ENDOMETRIOSIS FULGURATION  1990's  . NASAL SINUS SURGERY    .  [place] REMOVAL  2010?   left chest; placed in 2008  . TONSILLECTOMY  1980's  . TUBAL LIGATION  1990's    FAMILY HISTORY Family History  Problem Relation Age of Onset  . Heart disease Mother   . Arthritis Mother   . Hypertension Mother   . Stroke Mother   . Fibromyalgia Mother   . Coronary artery disease Unknown   . Heart attack Unknown   . Heart disease Maternal Uncle   . Colon cancer Paternal Uncle        dx over 28  . Heart disease Maternal Grandmother   . Heart attack Maternal Grandmother   . Ovarian cancer Paternal Grandmother   . Fibromyalgia Sister   . Ovarian cancer Paternal Aunt    the patient's father is still living, at age 38. He has  a history of Crohn's disease. The patient's mother is living, age 72. She has a history of fibromyalgia. The patient has 3 half-brothers, one sister. The patient's father's mother had either ovarian cancer or cervical cancer. The patient was tested in June 2008 for mutations in the BRCA1 and 2 genes. Complete sequencing found no mutations   GYNECOLOGIC HISTORY:  No LMP recorded. Patient has had a hysterectomy. menarche age 37, first live birth age 83. She is GX P1. She underwent simple hysterectomy, no salpingo-oophorectomy in 2007.   SOCIAL HISTORY:   she used to work for a [employer] in [employer]. Her husband of 23 years, [name], in addition to his regular job is Theme park manager of their [employer]. There daughter,[name], is currently 13. The patient attends the [facility] locally.     ADVANCED DIRECTIVES: Not in place. The patient's husband is her healthcare power of attorney   HEALTH MAINTENANCE: Social History  Substance Use Topics  . Smoking status: Never Smoker  . Smokeless tobacco: Never Used  . Alcohol use Yes     Comment: social drinker      Colonoscopy:  PAP:  Bone density:  Lipid panel:  Allergies  Allergen Reactions  . Ace Inhibitors Other (See Comments)    hyperkalemia  . Losartan Other (See Comments)    hyperkalemia  . Reglan [Metoclopramide Hcl] Anxiety  . Adhesive [Tape] Itching and Rash    Current Outpatient Prescriptions  Medication Sig Dispense Refill  . acetaminophen (TYLENOL) 325 MG tablet Take 2 tablets (650 mg total) by mouth every 4 (four) hours as needed for headache or mild pain.    Marland Kitchen albuterol (PROVENTIL HFA;VENTOLIN HFA) 108 (90 Base) MCG/ACT inhaler Inhale 1-2 puffs into the lungs daily.    . baclofen (LIORESAL) 10 MG tablet TAKE 1 TABLET BY MOUTH 2 (TWO) TIMES DAILY AS NEEDED FOR MUSCLE SPASMS. 60 tablet 0  . BLACK CURRANT SEED OIL PO Take 1 tablet by mouth.    . carvedilol (COREG) 25 MG tablet Take 0.5 tablets (12.5 mg total) by mouth 2 (two) times  daily with a meal. 60 tablet 6  . Cholecalciferol (VITAMIN D3) 5000 UNITS CAPS Take 1 capsule by mouth daily.     . Coenzyme Q10 (CO Q-10) 100 MG CAPS Take 100 mg by mouth daily.    . Cyanocobalamin 2500 MCG SUBL Place 2,500 mcg under the tongue daily.    . digoxin (LANOXIN) 0.125 MG tablet Take 0.5 tablets (0.0625 mg total) by mouth daily. 30 tablet 2  . digoxin (LANOXIN) 0.125 MG tablet TAKE 0.5 TABLETS (0.0625 MG TOTAL) BY MOUTH DAILY. 15 tablet 2  . divalproex (DEPAKOTE ER) 500 MG  24 hr tablet Take 1 tablet in a.m. and 2 tablets in p.m. 90 tablet 5  . DULoxetine (CYMBALTA) 30 MG capsule Take 1 capsule (30 mg total) by mouth 2 (two) times daily. 60 capsule 5  . ferrous sulfate 325 (65 FE) MG tablet Take 325 mg by mouth daily with breakfast.    . GLUCOSAMINE-CHONDROITIN PO Take 1,200 mg by mouth daily.    . hydrOXYzine (ATARAX/VISTARIL) 25 MG tablet Take 25 mg by mouth at bedtime as needed for itching (DOESN'T TAKE WITH TRAZODONE).    Marland Kitchen ivabradine (CORLANOR) 5 MG TABS tablet Take 1 tablet (5 mg total) by mouth 2 (two) times daily with a meal. 180 tablet 3  . loratadine (CLARITIN) 10 MG tablet Take 10 mg by mouth daily.    Marland Kitchen losartan (COZAAR) 25 MG tablet Take 1 tablet (25 mg total) by mouth daily. 90 tablet 3  . metolazone (ZAROXOLYN) 2.5 MG tablet Take 1 tablet (2.5 mg total) by mouth daily as needed (edema). 15 tablet 0  . Milk Thistle 150 MG CAPS Take 450 mg by mouth daily.     . Multiple Vitamin (MULTIVITAMIN WITH MINERALS) TABS Take 1 tablet by mouth daily. Reported on 10/13/2015    . omeprazole (PRILOSEC) 40 MG capsule Take 1 capsule (40 mg total) by mouth 2 (two) times daily before a meal. Breakfast and supper 60 capsule 9  . ondansetron (ZOFRAN) 4 MG tablet Take 1 tablet (4 mg total) by mouth every 8 (eight) hours as needed for nausea or vomiting. 20 tablet 3  . PATADAY 0.2 % SOLN Place 1 drop into both eyes daily.     . pentosan polysulfate (ELMIRON) 100 MG capsule Take 200 mg by mouth  daily.    . potassium chloride Roy (K-DUR,KLOR-CON) 20 MEQ tablet Take 2 tablets (40 mEq total) by mouth daily. 180 tablet 3  . spironolactone (ALDACTONE) 25 MG tablet TAKE 0.5 TABLETS (12.5 MG TOTAL) BY MOUTH DAILY. 45 tablet 3  . torsemide (DEMADEX) 20 MG tablet TAKE 2 TABLETS (40 MG TOTAL) BY MOUTH DAILY. 60 tablet 6  . traZODone (DESYREL) 50 MG tablet Take 50 mg by mouth as needed. FOR SLEEP     No current facility-administered medications for this visit.     OBJECTIVE:  African-American woman  Vitals:   02/01/17 1455  BP: (!) 84/50  Pulse: 66  Resp: 20  Temp: 98.2 F (36.8 C)  SpO2: 92%     Body mass index is 40.02 kg/m.    ECOG FS:2 - Symptomatic, <50% confined to bed  Sclerae unicteric, EOMs intact Oropharynx clear and moist No cervical or supraclavicular adenopathy Lungs no rales or rhonchi Heart regular rate and rhythm Abd soft, nontender, positive bowel sounds MSK no focal spinal tenderness, no upper extremity lymphedema Neuro: nonfocal, well oriented, appropriate affect Breasts: The right breast is status post remote lumpectomy and radiation. It is smaller than the left and the contour is disrupted but there is no evidence of local recurrence. I do not palpate any mass in the left breast or left axilla.  LAB RESULTS:  CMP     Component Value Date/Time   NA 134 (L) 02/01/2017 1435   K 4.0 02/01/2017 1435   CL 95 (L) 10/28/2016 0851   CO2 31 (H) 02/01/2017 1435   GLUCOSE 89 02/01/2017 1435   BUN 23.2 02/01/2017 1435   CREATININE 1.3 (H) 02/01/2017 1435   CALCIUM 10.4 02/01/2017 1435   PROT 9.0 (H) 02/01/2017 1435  ALBUMIN 3.6 02/01/2017 1435   AST 20 02/01/2017 1435   ALT 9 02/01/2017 1435   ALKPHOS 111 02/01/2017 1435   BILITOT 2.16 (H) 02/01/2017 1435   GFRNONAA 60 (L) 10/28/2016 0851   GFRAA >60 10/28/2016 0851    INo results found for: SPEP, UPEP  Lab Results  Component Value Date   WBC 7.1 02/01/2017   NEUTROABS 3.9 02/01/2017   HGB 16.0 (H)  02/01/2017   HCT 48.9 (H) 02/01/2017   MCV 96.1 02/01/2017   PLT 152 02/01/2017      Chemistry      Component Value Date/Time   NA 134 (L) 02/01/2017 1435   K 4.0 02/01/2017 1435   CL 95 (L) 10/28/2016 0851   CO2 31 (H) 02/01/2017 1435   BUN 23.2 02/01/2017 1435   CREATININE 1.3 (H) 02/01/2017 1435   GLU 89 02/18/2015      Component Value Date/Time   CALCIUM 10.4 02/01/2017 1435   ALKPHOS 111 02/01/2017 1435   AST 20 02/01/2017 1435   ALT 9 02/01/2017 1435   BILITOT 2.16 (H) 02/01/2017 1435       Lab Results  Component Value Date   LABCA2 24 08/19/2011    No components found for: [id number]  No results for input(s): INR in the last 168 hours.  Urinalysis    Component Value Date/Time   COLORURINE AMBER (A) 02/18/2016 1741   APPEARANCEUR CLEAR 02/18/2016 1741   LABSPEC 1.018 02/18/2016 1741   PHURINE 5.5 02/18/2016 1741   GLUCOSEU NEGATIVE 02/18/2016 1741   HGBUR NEGATIVE 02/18/2016 1741   BILIRUBINUR SMALL (A) 02/18/2016 1741   KETONESUR 15 (A) 02/18/2016 1741   PROTEINUR NEGATIVE 02/18/2016 1741   UROBILINOGEN 0.2 05/02/2008 1222   NITRITE NEGATIVE 02/18/2016 1741   LEUKOCYTESUR NEGATIVE 02/18/2016 1741    STUDIES: No results found.   ASSESSMENT: 46 y.o.  BRCA negative [place] woman   RIGHT BREAST CANCER:  (1) status post right lumpectomy and sentinel lymph node sampling 08/22/2006 for a pT2 pN1, stage IIB invasive ductal carcinoma, grade 3, estrogen receptor 99% positive, progesterone receptor 98% positive, with an MIB-1 of 16% and no HER-2 amplification   (a) biopsy of a palpable area in the right breast 4:00 position 12/27/2016 showed only fat necrosis  (2) completion axillary dissection 09/07/2006 showed no additional positive lymph nodes (total of 17 lymph nodes sampled, one positive)  (3) adjuvant chemotherapy followed ECOG 10/12/2001, arm D   (a) started 10/21/2006 and consisted of cyclophosphamide and doxorubicin given in dose dense fashion  4, completed 12/02/2006 (total doxorubicin dose 240 mg/m), followed by   (b) weekly paclitaxel 12 together with either bevacizumab or placebo given every 3 weeks, completed 03/03/2007    (c) bevacizumab was to have been continued, but protocol-mandated echocardiogram 03/06/2007 showed the patient's previously normal left ventricular ejection fraction to have dropped to 35%.  (4)  completed radiation treatment to the right breast 05/17/2007-- 5040 cGy plus a 1260 cGy boost  (5) received letrozole between December 2008 and March 2013  LEFT BREAST CANCER:  (6) status post left breast upper inner quadrant biopsy 12/07/2016 for a clinical T1b N0, stage IA invasive ductal carcinoma, grade 2, estrogen receptor positive, progesterone receptor negative, with no HER-2 amplification, and an MIB-1 of 10%.  (7) definitive surgery pending  (a) biopsy of a of the suspicious area in the right breast, 4:00 showed only fat necrosis.  (8) genetics testing 01/13/2017  (9) adjuvant radiation to follow as appropriate  (10) anti-estrogens  to follow with the completion of local treatment.  (a) received 1 dose of fulvestrant 01/04/2017--unable to tolerate  ADDITIONAL CONCERNS:  (a) severe cardiomyopathy with most recent echocardiogram 06/24/2014 showing an ejection fraction  20 -25%  (a) ICD implanted May 2013  (b) fibromyalgia, with chronic pain and some residual grade 1 neuropathy from chemotherapy   (8c sleep apnea documented through polysomnography 07/24/2013  (d) conversion disorder with no evidence of myotonia or myotonic dystrophy on exam at [facility] (under Rushie Goltz MD 11/15/2013)    PLAN: I spent approximately 30 minutes with Shamyah with most of that time spent discussing her complex problems. Her surgery has had to be repeatedly postponed because they need to coordinate the cardiology issues with the surgical schedule. It is currently scheduled for September 10. She is appropriately worried  about this and we had a prior for all discussion regarding her situation in that regard  While the worsening of her chronic problems may be due to Faslodex, that would be unusual. Nevertheless we are stopping that medication. She did much better with letrozole in the past and I have placed a prescription for her to resume that now. She will let me know if she has any problems relating to that. Clearly we cannot go on to tamoxifen because of concerns regarding clotting. If she cannot tolerate letrozole we can consider exemestane.  She is very better about her chronic pain. She tells me she had a very bad experience in the pain clinic about 4 years ago. We are going to refer her to a pain clinic locally and see if that works better for her  Otherwise she will return to see me late October, by which time she should have recovered from her surgery and we should be able to tell how she is tolerating the letrozole.  She knows to call for any other issues that may develop before that visit.  Marland Kitchen Chauncey Cruel, MD   02/01/2017 3:29 PM

## 2017-02-03 ENCOUNTER — Encounter: Payer: Self-pay | Admitting: Genetic Counselor

## 2017-02-03 ENCOUNTER — Telehealth: Payer: Self-pay | Admitting: Genetic Counselor

## 2017-02-03 ENCOUNTER — Ambulatory Visit: Payer: Self-pay | Admitting: Genetic Counselor

## 2017-02-03 DIAGNOSIS — C50911 Malignant neoplasm of unspecified site of right female breast: Secondary | ICD-10-CM

## 2017-02-03 DIAGNOSIS — Z800 Family history of malignant neoplasm of digestive organs: Secondary | ICD-10-CM

## 2017-02-03 DIAGNOSIS — Z1379 Encounter for other screening for genetic and chromosomal anomalies: Secondary | ICD-10-CM | POA: Insufficient documentation

## 2017-02-03 DIAGNOSIS — Z170 Estrogen receptor positive status [ER+]: Secondary | ICD-10-CM

## 2017-02-03 DIAGNOSIS — Z8041 Family history of malignant neoplasm of ovary: Secondary | ICD-10-CM

## 2017-02-03 DIAGNOSIS — C50212 Malignant neoplasm of upper-inner quadrant of left female breast: Secondary | ICD-10-CM

## 2017-02-03 NOTE — Progress Notes (Signed)
HPI: Ms. Costello was previously seen in the [facility] due to a personal and family history of cancer and concerns regarding a hereditary predisposition to cancer. Please refer to our prior cancer genetics clinic note for more information regarding Ms. Ridinger's medical, social and family histories, and our assessment and recommendations, at the time. Ms. Gerstel recent genetic test results were disclosed to her, as were recommendations warranted by these results. These results and recommendations are discussed in more detail below.  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  The Hereditary Gene Panel offered by [facility] includes sequencing and/or deletion duplication testing of the following 46 genes: APC, ATM, AXIN2, BARD1, BMPR1A, BRCA1, BRCA2, BRIP1, CDH1, CDKN2A (p14ARF), CDKN2A (p16INK4a), CHEK2, CTNNA1, DICER1, EPCAM (Deletion/duplication testing only), GREM1 (promoter region deletion/duplication testing only), KIT, MEN1, MLH1, MSH2, MSH3, MSH6, MUTYH, NBN, NF1, NHTL1, PALB2, PDGFRA, PMS2, POLD1, POLE, PTEN, RAD50, RAD51C, RAD51D, SDHB, SDHC, SDHD, SMAD4, SMARCA4. STK11, TP53, TSC1, TSC2, and VHL.  The following genes were evaluated for sequence changes only: SDHA and HOXB13 c.251G>A variant only.  The report date is February 02, 2017.       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  The Hereditary Gene Panel offered by [facility] includes sequencing and/or deletion  duplication testing of the following 46 genes: APC, ATM, AXIN2, BARD1, BMPR1A, BRCA1, BRCA2, BRIP1, CDH1, CDKN2A (p14ARF), CDKN2A (p16INK4a), CHEK2, CTNNA1, DICER1, EPCAM (Deletion/duplication testing only), GREM1 (promoter region deletion/duplication testing only), KIT, MEN1, MLH1, MSH2, MSH3, MSH6, MUTYH, NBN, NF1, NHTL1, PALB2, PDGFRA, PMS2, POLD1, POLE, PTEN, RAD50, RAD51C, RAD51D, SDHB, SDHC, SDHD, SMAD4, SMARCA4. STK11, TP53, TSC1, TSC2, and VHL.  The following genes were evaluated for sequence changes only: SDHA and HOXB13 c.251G>A variant only.  The report date is February 02, 2017.       FAMILY HISTORY:  We obtained a detailed, 4-generation family history.  Significant diagnoses are listed below: Family History  Problem Relation Age of Onset  . Heart disease Mother   . Arthritis Mother   . Hypertension Mother   . Stroke Mother   . Fibromyalgia Mother   . Coronary artery disease Unknown   . Heart attack Unknown   . Heart disease Maternal Uncle   . Colon cancer Paternal Uncle        dx over 35  . Heart disease Maternal Grandmother   . Heart attack Maternal Grandmother   . Ovarian cancer Paternal Grandmother   . Fibromyalgia Sister   . Ovarian cancer Paternal Aunt     The patient has one daughter who is cancer free.  She has a maternal half sister and three paternal half brothers, none of whom have cancer.  Both parents are living.  She met her father at age 20, and does not think he has had cancer.  Her mother is cancer free, but had a TAH for bleeding issue.  The patient's mother has maternal half siblings - one sister and four brothers. One brother had an  unknown form of cancer.  The patient's grandmother died of a heart attack and there is no information on her grandfather.  The patient's father had two sisters and two brothers.  One sister died of ovarian cancer and one brother died of colon cancer that was diagnosed over age 15.  Her paternal grandmother also had ovarian  cancer in her 52's.  Ms. Nott is unaware of previous family history of genetic testing for hereditary cancer risks. Patient's maternal ancestors are of African American descent, and paternal ancestors are of African American descent. There is no reported Ashkenazi Jewish ancestry. There is no known consanguinity.  GENETIC TEST RESULTS: Genetic testing reported out on February 02, 2017 through the Common Hereditary cancer panel found no deleterious mutations.  The Hereditary Gene Panel offered by [facility] includes sequencing and/or deletion duplication testing of the following 46 genes: APC, ATM, AXIN2, BARD1, BMPR1A, BRCA1, BRCA2, BRIP1, CDH1, CDKN2A (p14ARF), CDKN2A (p16INK4a), CHEK2, CTNNA1, DICER1, EPCAM (Deletion/duplication testing only), GREM1 (promoter region deletion/duplication testing only), KIT, MEN1, MLH1, MSH2, MSH3, MSH6, MUTYH, NBN, NF1, NHTL1, PALB2, PDGFRA, PMS2, POLD1, POLE, PTEN, RAD50, RAD51C, RAD51D, SDHB, SDHC, SDHD, SMAD4, SMARCA4. STK11, TP53, TSC1, TSC2, and VHL.  The following genes were evaluated for sequence changes only: SDHA and HOXB13 c.251G>A variant only.  The test report has been scanned into EPIC and is located under the Molecular Pathology section of the Results Review tab.   We discussed with Ms. Wernert that since the current genetic testing is not perfect, it is possible there may be a gene mutation in one of these genes that current testing cannot detect, but that chance is small. We also discussed, that it is possible that another gene that has not yet been discovered, or that we have not yet tested, is responsible for the cancer diagnoses in the family, and it is, therefore, important to remain in touch with cancer genetics in the future so that we can continue to offer Ms. Stockert the most up to date genetic testing.   Genetic testing did detect a Variant of Unknown Significance in the ATM gene called c.5168T>C (p.Val1723Ala). At this time, it is unknown if this  variant is associated with increased cancer risk or if this is a normal finding, but most variants such as this get reclassified to being inconsequential. It should not be used to make medical management decisions. With time, we suspect the lab will determine the significance of this variant, if any. If we do learn more about it, we will try to contact Ms. Sanor to discuss it further. However, it is important to stay in touch with [place] periodically and keep the address and phone number up to date.       CANCER SCREENING RECOMMENDATIONS:  This result is reassuring and indicates that Ms. Cariker likely does not have an increased risk for a future cancer due to a mutation in one of these genes. This normal test also suggests that Ms. Marcelino's cancer was most likely not due to an inherited predisposition associated with one of these genes.  Most cancers happen by chance and this negative test suggests that her cancer falls into this category.  We, therefore, recommended she continue to follow the cancer management and screening guidelines provided by her oncology and primary healthcare provider.   RECOMMENDATIONS FOR FAMILY MEMBERS: Women in this family might be at some increased risk of developing cancer, over the general population risk, simply due to the family history of cancer. We recommended women in  this family have a yearly mammogram beginning at age 28, or 9 years younger than the earliest onset of cancer, an annual clinical breast exam, and perform monthly breast self-exams. Women in this family should also have a gynecological exam as recommended by their primary provider. All family members should have a colonoscopy by age 68.  FOLLOW-UP: Lastly, we discussed with Ms. Fernholz that cancer genetics is a rapidly advancing field and it is possible that new genetic tests will be appropriate for her and/or her family members in the future. We encouraged her to remain in contact with cancer genetics on an  annual basis so we can update her personal and family histories and let her know of advances in cancer genetics that may benefit this family.   Our contact number was provided. Ms. Colvard questions were answered to her satisfaction, and she knows she is welcome to call us at anytime with additional questions or concerns.   Roma Kayser, MS, [facility] Certified Genetic Counselor [email or web address]

## 2017-02-03 NOTE — Telephone Encounter (Signed)
Revealed negative genetic testing.  Discussed that we do not know why she has breast cancer or why there is cancer in the family. It could be due to a different gene that we are not testing, or maybe our current technology may not be able to pick something up.  It will be important for her to keep in contact with genetics to keep up with whether additional testing may be needed.  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  We do not find this clinically actionable.  We will recontact her when this is reclassified.

## 2017-02-05 NOTE — Progress Notes (Signed)
   Dear [name], I am glad her pain problems will get attention- I know her for so many years now. Thank you for the update.   Krystal Allbaugh, MD

## 2017-02-07 ENCOUNTER — Ambulatory Visit (INDEPENDENT_AMBULATORY_CARE_PROVIDER_SITE_OTHER): Payer: Medicare Other | Admitting: Family Medicine

## 2017-02-07 ENCOUNTER — Encounter: Payer: Self-pay | Admitting: Family Medicine

## 2017-02-07 VITALS — BP 96/63 | HR 76 | Ht 68.5 in | Wt 258.2 lb

## 2017-02-07 DIAGNOSIS — R7302 Impaired glucose tolerance (oral): Secondary | ICD-10-CM

## 2017-02-07 DIAGNOSIS — C50912 Malignant neoplasm of unspecified site of left female breast: Secondary | ICD-10-CM

## 2017-02-07 DIAGNOSIS — F09 Unspecified mental disorder due to known physiological condition: Secondary | ICD-10-CM

## 2017-02-07 DIAGNOSIS — C50911 Malignant neoplasm of unspecified site of right female breast: Secondary | ICD-10-CM

## 2017-02-07 DIAGNOSIS — I5023 Acute on chronic systolic (congestive) heart failure: Secondary | ICD-10-CM

## 2017-02-07 DIAGNOSIS — F0789 Other personality and behavioral disorders due to known physiological condition: Secondary | ICD-10-CM

## 2017-02-07 DIAGNOSIS — I5022 Chronic systolic (congestive) heart failure: Secondary | ICD-10-CM

## 2017-02-07 DIAGNOSIS — IMO0001 Reserved for inherently not codable concepts without codable children: Secondary | ICD-10-CM

## 2017-02-07 DIAGNOSIS — N6320 Unspecified lump in the left breast, unspecified quadrant: Secondary | ICD-10-CM

## 2017-02-07 DIAGNOSIS — C50811 Malignant neoplasm of overlapping sites of right female breast: Secondary | ICD-10-CM

## 2017-02-07 DIAGNOSIS — Z170 Estrogen receptor positive status [ER+]: Secondary | ICD-10-CM

## 2017-02-07 DIAGNOSIS — E559 Vitamin D deficiency, unspecified: Secondary | ICD-10-CM

## 2017-02-07 DIAGNOSIS — I951 Orthostatic hypotension: Secondary | ICD-10-CM

## 2017-02-07 DIAGNOSIS — F449 Dissociative and conversion disorder, unspecified: Secondary | ICD-10-CM

## 2017-02-07 DIAGNOSIS — M797 Fibromyalgia: Secondary | ICD-10-CM

## 2017-02-07 DIAGNOSIS — G620 Drug-induced polyneuropathy: Secondary | ICD-10-CM

## 2017-02-07 DIAGNOSIS — Z950 Presence of cardiac pacemaker: Secondary | ICD-10-CM | POA: Insufficient documentation

## 2017-02-07 DIAGNOSIS — R569 Unspecified convulsions: Secondary | ICD-10-CM

## 2017-02-07 NOTE — Progress Notes (Signed)
New patient office visit note:  Impression and Recommendations:    1. Obesity, Class II, BMI 35-39.9, with comorbidity   2. Malignant neoplasm of right female breast, unspecified estrogen receptor status, unspecified site of breast ([place])   3. Left breast mass- path c/w CA- Dr Dalbert Batman   4. SYSTOLIC HEART FAILURE, CHRONIC   5. Fibromyalgia syndrome   6. S/P placement of cardiac pacemaker   7. Acute on chronic systolic CHF (congestive heart failure) ([place])   8. Cognitive and neurobehavioral dysfunction   9. Conversion disorder   10. Convulsions, unspecified convulsion type ([place])   11. Neuropathy due to drug ([place])   12. Orthostatic hypotension   13. Malignant neoplasm of left female breast, unspecified estrogen receptor status, unspecified site of breast ([place]) Active  14. Chronic systolic heart failure ([place])   15. Malignant neoplasm of overlapping sites of right breast in female, estrogen receptor positive ([place])   16. Glucose intolerance (impaired glucose tolerance)   17. Vitamin D deficiency      No problem-specific Assessment & Plan notes found for this encounter.   The patient was counseled, risk factors were discussed, anticipatory guidance given.   New Prescriptions   No medications on file    No orders of the defined types were placed in this encounter.   Discontinued Medications   No medications on file    Modified Medications   No medications on file    Orders Placed This Encounter  Procedures  . CBC with Differential/Platelet  . Comprehensive metabolic panel  . Lipid panel  . Hemoglobin A1c  . Magnesium  . Phosphorus  . T4, free  . TSH  . VITAMIN D 25 Hydroxy (Vit-D Deficiency, Fractures)  . Vitamin B12     Gross side effects, risk and benefits, and alternatives of medications discussed with patient.  Patient is aware that all medications have potential side effects and we are unable to predict every side effect or drug-drug interaction that  may occur.  Expresses verbal understanding and consents to current therapy plan and treatment regimen.  Return for FBW then ov with me 1 wk later to discuss.  Please see AVS handed out to patient at the end of our visit for further patient instructions/ counseling done pertaining to today's office visit.    Note: This document was prepared using Dragon voice recognition software and may include unintentional dictation errors.  ----------------------------------------------------------------------------------------------------------------------    Subjective:    Chief complaint:   Chief Complaint  Patient presents with  . Establish Care     HPI: [name] is a pleasant 46 y.o. female who presents to [facility] [place] today to review their medical history with me and establish care.   I asked the patient to review their chronic problem list with me to ensure everything was updated and accurate.    All recent office visits with other providers, any medical records that patient brought in etc  - I reviewed today.     Also asked pt to get me medical records from [facility] providers/ specialists that they had seen within the past 3-5 years- if they are in private practice and/or do not work for a [employer], [facility], [place], [place] or [employer] owned practice.  Told them to call their specialists to clarify this if they are not sure.   Pt with 1:45 pm appt--> I was not able to come into room to see her until 2:18 pm  Problem  Glucose Intolerance (Impaired Glucose Tolerance)  Malignant Neoplasm of Upper-Inner Quadrant of Left Breast in Female, Estrogen Receptor Positive (Hcc)  Physical Deconditioning  Vitamin D Deficiency  Obesity, Class II, Bmi 35-39.9, With Comorbidity   Qualifier: Diagnosis of  By: Wonda Amis     Essential Hypertension   Qualifier: Diagnosis of  By: Wonda Amis     Malignant Neoplasm of Left Female Breast (Hcc)    Convulsions/Seizures (Hcc)  Acute On Chronic Systolic Chf (Congestive Heart Failure) (Hcc)  Breast Cancer, Right Breast (Hcc)   2008--> R sided breast cancer.   1 mo ago--> has mass L breast- recent bx showed breast ca.   Fibromyalgia Affecting Multiple Sites   Did see Dr Bedelia Person in recent for this.  Last seen 2010- not since b/c doc would not see her back for this.    Dr Lerry Paterson from [facility] ortho- referred pt to Dr Dollene Cleveland- [facility] - has appt this Wednesday for  .      SYSTOLIC HEART FAILURE, CHRONIC   Trial off acei rec 05/29/2013   Dr Jesusita Oka Bennsihmon- [facility]    Family History of Ovarian Cancer  Family History of Colon Cancer  Generalized Anxiety Disorder  Gerd (Gastroesophageal Reflux Disease)  Left breast mass- path c/w CA- Dr Derrell Lolling  Fibromyalgia Syndrome  S/P Placement of Cardiac Pacemaker  Acute Renal Insufficiency      Wt Readings from Last 3 Encounters:  02/07/17 258 lb 3.2 oz (117.1 kg)  02/01/17 255 lb 8 oz (115.9 kg)  01/13/17 271 lb 6.4 oz (123.1 kg)   BP Readings from Last 3 Encounters:  02/07/17 96/63  02/01/17 (!) 84/50  01/13/17 90/66   Pulse Readings from Last 3 Encounters:  02/07/17 76  02/01/17 66  01/13/17 67   BMI Readings from Last 3 Encounters:  02/07/17 38.69 kg/m  02/01/17 40.02 kg/m  01/13/17 42.51 kg/m    Patient Care Team    Relationship Specialty Notifications Start End  Thomasene Lot, DO PCP - General Family Medicine  01/21/17   Bensimhon, Bevelyn Buckles, MD Consulting Physician Cardiology  07/08/14   Magrinat, Valentino Hue, MD Consulting Physician Oncology  07/08/14   Duke Salvia, MD Consulting Physician Cardiology  07/08/14   Dohmeier, Porfirio Mylar, MD Consulting Physician Neurology  07/08/14   Coralyn Helling, MD Consulting Physician Pulmonary Disease  07/08/14   Claud Kelp, MD Consulting Physician General Surgery  12/28/16   Lonie Peak, MD Attending Physician Radiation Oncology  12/28/16   Kirkland Hun, MD Consulting  Physician Obstetrics and Gynecology  02/07/17   Ranee Gosselin, MD Consulting Physician Orthopedic Surgery  02/07/17   Jamison Neighbor, MD Consulting Physician Urology  02/07/17    Comment: Interstitial cystitis    Patient Active Problem List   Diagnosis Date Noted  . Glucose intolerance (impaired glucose tolerance) 02/07/2017    Priority: High  . Malignant neoplasm of upper-inner quadrant of left breast in female, estrogen receptor positive (HCC) 12/22/2016    Priority: High  . Physical deconditioning 06/24/2014    Priority: High  . Vitamin D deficiency 08/19/2011    Priority: High  . Obesity, Class II, BMI 35-39.9, with comorbidity 04/29/2008    Priority: High  . Essential hypertension 04/29/2008    Priority: High  . Malignant neoplasm of left female breast (HCC) 02/07/2017    Priority: Medium  . Convulsions/seizures (HCC) 12/25/2014    Priority: Medium  . Acute on chronic systolic CHF (congestive heart failure) (HCC) 11/27/2014  Priority: Medium  . Breast cancer, right breast (HCC) 05/29/2014    Priority: Medium  . Fibromyalgia affecting multiple sites 05/29/2014    Priority: Medium  . SYSTOLIC HEART FAILURE, CHRONIC 08/25/2008    Priority: Medium  . Family history of ovarian cancer     Priority: Low  . Family history of colon cancer     Priority: Low  . Generalized anxiety disorder 07/08/2014    Priority: Low  . GERD (gastroesophageal reflux disease) 12/09/2010    Priority: Low  . Left breast mass- path c/w CA- Dr Derrell Lolling 02/07/2017  . Fibromyalgia syndrome 02/07/2017  . S/P placement of cardiac pacemaker 02/07/2017  . Genetic testing 02/03/2017  . Malignant neoplasm of overlapping sites of right breast in female, estrogen receptor positive (HCC) 12/28/2016  . History of malignant neoplasm of breast 09/01/2015  . Conversion disorder with abnormal movement 12/25/2014  . Myoclonus 12/20/2014  . Acute renal insufficiency 11/28/2014  . ICD in place (MDT 2013)  11/27/2014  . Near syncope 11/26/2014  . Stomach discomfort 10/14/2014  . Chronic pain not due to malignancy 05/29/2014  . Dyspnea 01/07/2014  . Chronic sinusitis 01/07/2014  . Conversion disorder 09/03/2013  . Neuropathy due to drug (HCC) 04/27/2013  . 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 Uncontrolled persistent asthma 11/15/2012  . Orthostatic hypotension 05/18/2012  . Keloid 03/14/2012  . Single implantable cardioverter-defibrillator in situ 11/03/2011  . Weakness 07/15/2011  . Chest pain 12/09/2010  . Secondary cardiomyopathy s/p chemo therapy 04/01/2009  . FATIGUE / MALAISE 10/02/2008  . DIFFICULTY IN [facility] 04/29/2008  . DIZZINESS 04/29/2008  . NUMBNESS 04/29/2008  . EDEMA 04/29/2008  . ABNORMAL WEIGHT GAIN 04/29/2008  . PALPITATIONS 04/29/2008  . ORTHOPNEA 04/29/2008  . DYSPNEA 04/29/2008  . COUGH 04/29/2008     Past Medical History:  Diagnosis Date  . Acne   . Anemia 1980's   [id number]  . Anxiety   . Arthritis   . 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 Blood transfusion 1980's   1987 or 1988  . Breast calcifications    right breast  . Breast cancer (HCC)    right;  . 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 Cardiac MRI 05/2011 EF 32%. ICD implantation 10/2011 ([employer]tronic)  . 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 Conversion disorder   . 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 Depression   . Endometriosis   . Family history of colon cancer   . Family history of ovarian cancer   . Fibromyalgia   . GERD (gastroesophageal reflux disease)   . Hepatomegaly   . History of stomach ulcers   . Hyperlipidemia   . Hypertension    c/b orthostatic hypotention  . ICD (implantable cardiac defibrillator) in place   . Insomnia   . Interstitial cystitis   . Left eye injury 12/2014  . 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   . Nonischemic cardiomyopathy (HCC)    related to chemo; EF 30% 05/2011  . Obesity   . Palpitation    normal sinus rhythm only on 21 day heart monitor  . Panic attacks   . Peripheral neuropathy    chemo- induced  . Pneumonia 2000's   "once"  . Seasonal allergies   . Type II diabetes mellitus (HCC)      Past Medical History:  Diagnosis  Date  . Acne   . Anemia 1980's   [id number]  . Anxiety   . Arthritis   . 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 Blood transfusion 1980's   1987 or 1988  . Breast calcifications    right breast  . Breast cancer (HCC)    right;  . 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 Cardiac MRI 05/2011 EF 32%. ICD implantation 10/2011 (Medtronic)  . 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 Conversion disorder   . 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 Depression   . Endometriosis   . Family history of colon cancer   . Family history of ovarian cancer   . Fibromyalgia   . GERD (gastroesophageal reflux disease)   . Hepatomegaly   . History of stomach ulcers   . Hyperlipidemia   . Hypertension    c/b orthostatic hypotention  . ICD (implantable cardiac defibrillator) in place   . Insomnia   . Interstitial cystitis   . Left eye injury 12/2014  . 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   . Nonischemic cardiomyopathy (HCC)    related to chemo; EF 30% 05/2011  . Obesity   . Palpitation    normal sinus rhythm only on 21 day heart monitor  . Panic attacks   . Peripheral neuropathy    chemo- induced  . Pneumonia 2000's   "once"  . Seasonal allergies   . Type II diabetes mellitus (HCC)      Past Surgical History:  Procedure Laterality Date  . ABDOMINAL HYSTERECTOMY     partial  . BREAST EXCISIONAL BIOPSY      right benign 2012  . BREAST LUMPECTOMY  2008; 2013   right radiation  . CARDIAC DEFIBRILLATOR PLACEMENT  11/03/11  . CESAREAN SECTION    . CHOLECYSTECTOMY  ~ 2009  . COLONOSCOPY    . IMPLANTABLE CARDIOVERTER DEFIBRILLATOR IMPLANT N/A 11/03/2011   Procedure: IMPLANTABLE CARDIOVERTER DEFIBRILLATOR IMPLANT;  Surgeon: Duke Salvia, MD; MDT   . LAPAROSCOPIC ENDOMETRIOSIS FULGURATION  1990's  . NASAL SINUS SURGERY    . PORT-A-CATH REMOVAL  2010?   left chest; placed in 2008  . TONSILLECTOMY  1980's  . TUBAL LIGATION  1990's     Family History  Problem Relation Age of Onset  . Heart disease Mother   . Arthritis Mother   . Hypertension Mother   . Stroke Mother   . Fibromyalgia Mother   . Coronary artery disease Unknown   . Heart attack Unknown   . Heart disease Maternal Uncle   . Colon cancer Paternal Uncle        dx over 49  . Heart disease Maternal Grandmother   . Heart attack Maternal Grandmother   . Ovarian cancer Paternal Grandmother   . Fibromyalgia Sister   . Ovarian cancer Paternal Aunt      History  Drug Use No     History  Alcohol Use  . Yes    Comment: social drinker      History  Smoking Status  . Never Smoker  Smokeless Tobacco  . Never Used     Outpatient Encounter Prescriptions as of 02/07/2017  Medication Sig Note  . acetaminophen (TYLENOL) 325 MG tablet Take 2 tablets (650 mg total) by mouth every 4 (four) hours as needed for headache or mild pain.   Marland Kitchen albuterol (PROVENTIL  HFA;VENTOLIN HFA) 108 (90 Base) MCG/ACT inhaler Inhale 1-2 puffs into the lungs daily.   . baclofen (LIORESAL) 10 MG tablet TAKE 1 TABLET BY MOUTH 2 (TWO) TIMES DAILY AS NEEDED FOR MUSCLE SPASMS.   Marland Kitchen BLACK CURRANT SEED OIL PO Take 1 tablet by mouth.   . carvedilol (COREG) 25 MG tablet Take 0.5 tablets (12.5 mg total) by mouth 2 (two) times daily with a meal.   . Cholecalciferol (VITAMIN D3) 5000 UNITS CAPS Take 1 capsule by mouth daily.    . Coenzyme Q10 (CO Q-10) 100 MG  CAPS Take 100 mg by mouth daily.   . Cyanocobalamin 2500 MCG SUBL Place 2,500 mcg under the tongue daily.   . digoxin (LANOXIN) 0.125 MG tablet Take 0.5 tablets (0.0625 mg total) by mouth daily.   . digoxin (LANOXIN) 0.125 MG tablet TAKE 0.5 TABLETS (0.0625 MG TOTAL) BY MOUTH DAILY.   . divalproex (DEPAKOTE ER) 500 MG 24 hr tablet Take 1 tablet in a.m. and 2 tablets in p.m.   Marland Kitchen DULoxetine (CYMBALTA) 30 MG capsule Take 1 capsule (30 mg total) by mouth 2 (two) times daily.   . ferrous sulfate 325 (65 FE) MG tablet Take 325 mg by mouth daily with breakfast.   . GLUCOSAMINE-CHONDROITIN PO Take 1,200 mg by mouth daily.   . hydrOXYzine (ATARAX/VISTARIL) 25 MG tablet Take 25 mg by mouth at bedtime as needed for itching (DOESN'T TAKE WITH TRAZODONE).   Marland Kitchen ivabradine (CORLANOR) 5 MG TABS tablet Take 1 tablet (5 mg total) by mouth 2 (two) times daily with a meal.   . letrozole (FEMARA) 2.5 MG tablet Take 1 tablet (2.5 mg total) by mouth daily.   Marland Kitchen loratadine (CLARITIN) 10 MG tablet Take 10 mg by mouth daily.   Marland Kitchen losartan (COZAAR) 25 MG tablet Take 1 tablet (25 mg total) by mouth daily.   . metolazone (ZAROXOLYN) 2.5 MG tablet Take 1 tablet (2.5 mg total) by mouth daily as needed (edema). 10/18/2016: Takes 1 tablet about 1x per month  . Milk Thistle 150 MG CAPS Take 450 mg by mouth daily.    . Multiple Vitamin (MULTIVITAMIN WITH MINERALS) TABS Take 1 tablet by mouth daily. Reported on 10/13/2015   . omeprazole (PRILOSEC) 40 MG capsule Take 1 capsule (40 mg total) by mouth 2 (two) times daily before a meal. Breakfast and supper   . ondansetron (ZOFRAN) 4 MG tablet Take 1 tablet (4 mg total) by mouth every 8 (eight) hours as needed for nausea or vomiting.   Marland Kitchen PATADAY 0.2 % SOLN Place 1 drop into both eyes daily.    . pentosan polysulfate (ELMIRON) 100 MG capsule Take 200 mg by mouth daily.   . potassium chloride SA (K-DUR,KLOR-CON) 20 MEQ tablet Take 2 tablets (40 mEq total) by mouth daily.   Marland Kitchen spironolactone  (ALDACTONE) 25 MG tablet TAKE 0.5 TABLETS (12.5 MG TOTAL) BY MOUTH DAILY.   Marland Kitchen torsemide (DEMADEX) 20 MG tablet TAKE 2 TABLETS (40 MG TOTAL) BY MOUTH DAILY.   . traZODone (DESYREL) 50 MG tablet Take 50 mg by mouth as needed. FOR SLEEP    No facility-administered encounter medications on file as of 02/07/2017.     Allergies: Ace inhibitors; Losartan; Reglan [metoclopramide hcl]; and Adhesive [tape]   ROS   Objective:   Blood pressure 96/63, pulse 76, height 5' 8.5" (1.74 m), weight 258 lb 3.2 oz (117.1 kg). Body mass index is 38.69 kg/m. General: Well Developed, well nourished, and in no acute distress.  Neuro:  Alert and oriented x3, extra-ocular muscles intact, sensation grossly intact.  HEENT:[place]/AT, PERRLA, neck supple, No carotid bruits Skin: no gross rashes  Cardiac: Regular rate and rhythm Respiratory: Essentially clear to auscultation bilaterally. Not using accessory muscles, speaking in full sentences.  Abdominal: not grossly distended Musculoskeletal: Ambulates w/o diff, FROM * 4 ext.  Vasc: less 2 sec cap RF, warm and pink  Psych:  No HI/SI, judgement and insight good, Euthymic mood. Full Affect.    Recent Results (from the past 2160 hour(s))  CBC with Differential/Platelet     Status: Abnormal   Collection Time: 01/04/17 12:07 PM  Result Value Ref Range   WBC 4.1 3.9 - 10.3 10e3/uL   NEUT# 2.4 1.5 - 6.5 10e3/uL   HGB 12.8 11.6 - 15.9 g/dL   HCT 89.0 22.8 - 40.6 %   Platelets 120 (L) 145 - 400 10e3/uL   MCV 96.7 79.5 - 101.0 fL   MCH 31.6 25.1 - 34.0 pg   MCHC 32.7 31.5 - 36.0 g/dL   RBC 9.86 1.48 - 3.07 10e6/uL   RDW 15.7 (H) 11.2 - 14.5 %   lymph# 1.0 0.9 - 3.3 10e3/uL   MONO# 0.6 0.1 - 0.9 10e3/uL   Eosinophils Absolute 0.0 0.0 - 0.5 10e3/uL   Basophils Absolute 0.1 0.0 - 0.1 10e3/uL   NEUT% 58.2 38.4 - 76.8 %   LYMPH% 25.1 14.0 - 49.7 %   MONO% 14.4 (H) 0.0 - 14.0 %   EOS% 1.0 0.0 - 7.0 %   BASO% 1.3 0.0 - 2.0 %  Comprehensive metabolic panel      Status: Abnormal   Collection Time: 01/04/17 12:07 PM  Result Value Ref Range   Sodium 138 136 - 145 mEq/L   Potassium 4.0 3.5 - 5.1 mEq/L   Chloride 96 (L) 98 - 109 mEq/L   CO2 30 (H) 22 - 29 mEq/L   Glucose 84 70 - 140 mg/dl    Comment: Glucose reference range is for nonfasting patients. Fasting glucose reference range is 70- 100.   BUN 21.4 7.0 - 26.0 mg/dL   Creatinine 1.4 (H) 0.6 - 1.1 mg/dL   Total Bilirubin 3.54 (H) 0.20 - 1.20 mg/dL   Alkaline Phosphatase 106 40 - 150 U/L   AST 18 5 - 34 U/L   ALT 9 0 - 55 U/L   Total Protein 7.9 6.4 - 8.3 g/dL   Albumin 3.6 3.5 - 5.0 g/dL   Calcium 9.4 8.4 - 30.1 mg/dL   Anion Gap 12 (H) 3 - 11 mEq/L   EGFR 51 (L) >90 ml/min/1.73 m2    Comment: eGFR is calculated using the CKD-EPI Creatinine Equation (2009)  CBC with Differential/Platelet     Status: Abnormal   Collection Time: 01/13/17 10:27 AM  Result Value Ref Range   WBC 5.1 3.9 - 10.3 10e3/uL   NEUT# 2.9 1.5 - 6.5 10e3/uL   HGB 12.9 11.6 - 15.9 g/dL   HCT 48.4 03.9 - 79.5 %   Platelets 108 (L) 145 - 400 10e3/uL   MCV 97.5 79.5 - 101.0 fL   MCH 31.7 25.1 - 34.0 pg   MCHC 32.5 31.5 - 36.0 g/dL   RBC 3.69 2.23 - 0.09 10e6/uL   RDW 15.9 (H) 11.2 - 14.5 %   lymph# 1.4 0.9 - 3.3 10e3/uL   MONO# 0.8 0.1 - 0.9 10e3/uL   Eosinophils Absolute 0.0 0.0 - 0.5 10e3/uL   Basophils Absolute 0.0 0.0 - 0.1 10e3/uL   NEUT% 55.7  38.4 - 76.8 %   LYMPH% 26.7 14.0 - 49.7 %   MONO% 16.0 (H) 0.0 - 14.0 %   EOS% 0.8 0.0 - 7.0 %   BASO% 0.8 0.0 - 2.0 %  Comprehensive metabolic panel     Status: Abnormal   Collection Time: 01/13/17 10:27 AM  Result Value Ref Range   Sodium 137 136 - 145 mEq/L   Potassium 4.1 3.5 - 5.1 mEq/L   Chloride 99 98 - 109 mEq/L   CO2 29 22 - 29 mEq/L   Glucose 87 70 - 140 mg/dl    Comment: Glucose reference range is for nonfasting patients. Fasting glucose reference range is 70- 100.   BUN 21.0 7.0 - 26.0 mg/dL   Creatinine 1.3 (H) 0.6 - 1.1 mg/dL   Total Bilirubin  1.84 (H) 0.20 - 1.20 mg/dL   Alkaline Phosphatase 93 40 - 150 U/L   AST 18 5 - 34 U/L   ALT 7 0 - 55 U/L   Total Protein 7.7 6.4 - 8.3 g/dL   Albumin 3.5 3.5 - 5.0 g/dL   Calcium 8.9 8.4 - 10.4 mg/dL   Anion Gap 10 3 - 11 mEq/L   EGFR 59 (L) >90 ml/min/1.73 m2    Comment: eGFR is calculated using the CKD-EPI Creatinine Equation (2009)  CBC with Differential/Platelet     Status: Abnormal   Collection Time: 02/01/17  2:35 PM  Result Value Ref Range   WBC 7.1 3.9 - 10.3 10e3/uL   NEUT# 3.9 1.5 - 6.5 10e3/uL   HGB 16.0 (H) 11.6 - 15.9 g/dL   HCT 48.9 (H) 34.8 - 46.6 %   Platelets 152 145 - 400 10e3/uL   MCV 96.1 79.5 - 101.0 fL   MCH 31.5 25.1 - 34.0 pg   MCHC 32.8 31.5 - 36.0 g/dL   RBC 5.09 3.70 - 5.45 10e6/uL   RDW 15.6 (H) 11.2 - 14.5 %   lymph# 1.9 0.9 - 3.3 10e3/uL   MONO# 1.0 (H) 0.1 - 0.9 10e3/uL   Eosinophils Absolute 0.1 0.0 - 0.5 10e3/uL   Basophils Absolute 0.1 0.0 - 0.1 10e3/uL   NEUT% 55.8 38.4 - 76.8 %   LYMPH% 27.5 14.0 - 49.7 %   MONO% 13.7 0.0 - 14.0 %   EOS% 1.7 0.0 - 7.0 %   BASO% 1.3 0.0 - 2.0 %  Comprehensive metabolic panel     Status: Abnormal   Collection Time: 02/01/17  2:35 PM  Result Value Ref Range   Sodium 134 (L) 136 - 145 mEq/L   Potassium 4.0 3.5 - 5.1 mEq/L   Chloride 94 (L) 98 - 109 mEq/L   CO2 31 (H) 22 - 29 mEq/L   Glucose 89 70 - 140 mg/dl    Comment: Glucose reference range is for nonfasting patients. Fasting glucose reference range is 70- 100.   BUN 23.2 7.0 - 26.0 mg/dL   Creatinine 1.3 (H) 0.6 - 1.1 mg/dL   Total Bilirubin 2.16 (H) 0.20 - 1.20 mg/dL   Alkaline Phosphatase 111 40 - 150 U/L   AST 20 5 - 34 U/L   ALT 9 0 - 55 U/L   Total Protein 9.0 (H) 6.4 - 8.3 g/dL   Albumin 3.6 3.5 - 5.0 g/dL   Calcium 10.4 8.4 - 10.4 mg/dL   Anion Gap 10 3 - 11 mEq/L   EGFR 58 (L) >90 ml/min/1.73 m2    Comment: eGFR is calculated using the CKD-EPI Creatinine Equation (2009)

## 2017-02-07 NOTE — Patient Instructions (Signed)
Please make an appointment for fasting blood work in the future and then an appointment with me 1 week later to discuss labs and discuss diabetes further  Also, please call your medical insurance and find out which glucometer they prefer and then call the office and give a message to one of the medical assistance so we can call in a prescription for you for a specific glucometer with test strips etc.  When you go to any specialist especially if they're not associated with [facility] please make sure they are sending [place] your medical records so we can stay in the loop when it comes to your medical care.  --> As far as weight loss, please consider joining [employer] as it is a fantastic program.  Behavior Modification Ideas for Weight Management  Weight management involves adopting a healthy lifestyle that includes a knowledge of nutrition and exercise, a positive attitude and the right kind of motivation. Internal motives such as better health, increased energy, self-esteem and personal control increase your chances of lifelong weight management success.  Remember to have realistic goals and think long-term success. Believe in yourself and you can do it. The following information will give you ideas to help you meet your goals.  Control Your Home Environment  Eat only while sitting down at the kitchen or dining room table. Do not eat while watching television, reading, cooking, talking on the phone, standing at the refrigerator or working on the computer. Keep tempting foods out of the house - don't buy them. Keep tempting foods out of sight. Have low-calorie foods ready to eat. Unless you are preparing a meal, stay out of the kitchen. Have healthy snacks at your disposal, such as small pieces of fruit, vegetables, canned fruit, pretzels, low-fat string cheese and nonfat cottage cheese.  Control Your Work Environment  Do not eat at [employer] or keep tempting snacks at your desk. If  you get hungry between meals, plan healthy snacks and bring them with you to work. During your breaks, go for a walk instead of eating. If you work around food, plan in advance the one item you will eat at mealtime. Make it inconvenient to nibble on food by chewing gum, sugarless candy or drinking water or another low-calorie beverage. Do not work through meals. Skipping meals slows down metabolism and may result in overeating at the next meal. If food is available for special occasions, either pick the healthiest item, nibble on low-fat snacks brought from home, don't have anything offered, choose one option and have a small amount, or have only a beverage.  Control Your Mealtime Environment  Serve your plate of food at the stove or kitchen counter. Do not put the serving dishes on the table. If you do put dishes on the table, remove them immediately when finished eating. Fill half of your plate with vegetables, a quarter with lean protein and a quarter with starch. Use smaller plates, bowls and glasses. A smaller portion will look large when it is in a little dish. Politely refuse second helpings. When fixing your plate, limit portions of food to one scoop/serving or less.   Daily Food Management  Replace eating with another activity that you will not associate with food. Wait 20 minutes before eating something you are craving. Drink a large glass of water or diet soda before eating. Always have a big glass or bottle of water to drink throughout the day. Avoid high-calorie add-ons such as cream with your coffee,  butter, mayonnaise and salad dressings.  Shopping: Do not shop when hungry or tired. Shop from a list and avoid buying anything that is not on your list. If you must have tempting foods, buy individual-sized packages and try to find a lower-calorie alternative. Don't taste test in the store. Read food labels. Compare products to help you make the healthiest  choices.  Preparation: Chew a piece of gum while cooking meals. Use a quarter teaspoon if you taste test your food. Try to only fix what you are going to eat, leaving yourself no chance for seconds. If you have prepared more food than you need, portion it into individual containers and freeze or refrigerate immediately. Don't snack while cooking meals.  Eating: Eat slowly. Remember it takes about 20 minutes for your stomach to send a message to your brain that it is full. Don't let fake hunger make you think you need more. The ideal way to eat is to take a bite, put your utensil down, take a sip of water, cut your next bite, take a bit, put your utensil down and so on. Do not cut your food all at one time. Cut only as needed. Take small bites and chew your food well. Stop eating for a minute or two at least once during a meal or snack. Take breaks to reflect and have conversation.  Cleanup and Leftovers: Label leftovers for a specific meal or snack. Freeze or refrigerate individual portions of leftovers. Do not clean up if you are still hungry.  Eating Out and Social Eating  Do not arrive hungry. Eat something light before the meal. Try to fill up on low-calorie foods, such as vegetables and fruit, and eat smaller portions of the high-calorie foods. Eat foods that you like, but choose small portions. If you want seconds, wait at least 20 minutes after you have eaten to see if you are actually hungry or if your eyes are bigger than your stomach. Limit alcoholic beverages. Try a soda water with a twist of lime. Do not skip other meals in the day to save room for the special event.  At Restaurants: Order  la carte rather than buffet style. Order some vegetables or a salad for an appetizer instead of eating bread. If you order a high-calorie dish, share it with someone. Try an after-dinner mint with your coffee. If you do have dessert, share it with two or more people. Don't overeat  because you do not want to waste food. Ask for a doggie bag to take extra food home. Tell the server to put half of your entree in a to go bag before the meal is served to you. Ask for salad dressing, gravy or high-fat sauces on the side. Dip the tip of your fork in the dressing before each bite. If bread is served, ask for only one piece. Try it plain without butter or oil. At [employer] where oil and vinegar is served with bread, use only a small amount of oil and a lot of vinegar for dipping.  At a Friend[facility] House: Offer to bring a dish, appetizer or dessert that is low in calories. Serve yourself small portions or tell the host that you only want a small amount. Stand or sit away from the snack table. Stay away from the kitchen or stay busy if you are near the food. Limit your alcohol intake.  At [employer] and Cafeterias: Cover most of your plate with lettuce and/or vegetables. Use a salad plate instead  of a dinner plate. After eating, clear away your dishes before having coffee or tea.  Entertaining at Home: Explore low-fat, low-cholesterol cookbooks. Use single-serving foods like chicken breasts or hamburger patties. Prepare low-calorie appetizers and desserts.   Holidays: Keep tempting foods out of sight. Decorate the house without using food. Have low-calorie beverages and foods on hand for guests. Allow yourself one planned treat a day. Don't skip meals to save up for the holiday feast. Eat regular, planned meals.   Exercise Well  Make exercise a priority and a planned activity in the day. If possible, walk the entire or part of the distance to work. Get an exercise buddy. Go for a walk with a colleague during one of your breaks, go to the gym, run or take a walk with a friend, walk in the mall with a shopping companion. Park at the end of the parking lot and walk to the store or office entrance. Always take the stairs all of the way or at least part of the way to  your floor. If you have a desk job, walk around the office frequently. Do leg lifts while sitting at your desk. Do something outside on the weekends like going for a hike or a bike ride.   Have a Healthy Attitude  Make health your weight management priority. Be realistic. Have a goal to achieve a healthier you, not necessarily the lowest weight or ideal weight based on calculations or tables. Focus on a healthy eating style, not on dieting. Dieting usually lasts for a short amount of time and rarely produces long-term success. Think long term. You are developing new healthy behaviors to follow next month, in a year and in a decade.    This information is for educational purposes only and is not intended to replace the advice of your doctor or health care provider. We encourage you to discuss with your doctor any questions or concerns you may have.        Guidelines for Losing Weight   We want weight loss that will last so you should lose 1-2 pounds a week.  THAT IS IT! Please pick THREE things a month to change. Once it is a habit check off the item. Then pick another three items off the list to become habits.  If you are already doing a habit on the list GREAT!  Cross that item off!  Don't drink your calories. Ie, alcohol, soda, fruit juice, and sweet tea.   Drink more water. Drink a glass when you feel hungry or before each meal.   Eat breakfast - Complex carb and protein (likeDannon light and fit yogurt, oatmeal, fruit, eggs, [place] bacon).  Measure your cereal.  Eat no more than one cup a day. (ie Kashi)  Eat an apple a day.  Add a vegetable a day.  Try a new vegetable a month.  Use Pam! Stop using oil or butter to cook.  Don't finish your plate or use smaller plates.  Share your dessert.  Eat sugar free Jello for dessert or frozen grapes.  Don't eat 2-3 hours before bed.  Switch to whole wheat bread, pasta, and brown rice.  Make healthier choices when you eat  out. No fries!  Pick baked chicken, NOT fried.  Don't forget to SLOW DOWN when you eat. It is not going anywhere.   Take the stairs.  Park far away in the parking lot  Lift soup cans (or weights) for 10 minutes while watching TV.  Walk at work for 10 minutes during break.  Walk outside 1 time a week with your friend, kids, dog, or significant other.  Start a walking group at church.  Walk the mall as much as you can tolerate.   Keep a food diary.  Weigh yourself daily.  Walk for 15 minutes 3 days per week.  Cook at home more often and eat out less. If life happens and you go back to old habits, it is okay.  Just start over. You can do it!  If you experience chest pain, get short of breath, or tired during the exercise, please stop immediately and inform your doctor.    Before you even begin to attack a weight-loss plan, it pays to remember this: You are not fat. You have fat. Losing weight isn't about blame or shame; it's simply another achievement to accomplish. Dieting is like any other skill-you have to buckle down and work at it. As long as you act in a smart, reasonable way, you'll ultimately get where you want to be. Here are some weight loss pearls for you.   1. It's Not a Diet. It's a Lifestyle Thinking of a diet as something you're on and suffering through only for the short term doesn't work. To shed weight and keep it off, you need to make permanent changes to the way you eat. It's OK to indulge occasionally, of course, but if you cut calories temporarily and then revert to your old way of eating, you'll gain back the weight quicker than you can say yo-yo. Use it to lose it. Research shows that one of the best predictors of long-term weight loss is how many pounds you drop in the first month. For that reason, nutritionists often suggest being stricter for the first two weeks of your new eating strategy to build momentum. Cut out added sugar and alcohol and avoid  unrefined carbs. After that, figure out how you can reincorporate them in a way that's healthy and maintainable.  2. There's a Right Way to Exercise Working out burns calories and fat and boosts your metabolism by building muscle. But those trying to lose weight are notorious for overestimating the number of calories they burn and underestimating the amount they take in. Unfortunately, your system is biologically programmed to hold on to extra pounds and that means when you start exercising, your body senses the deficit and ramps up its hunger signals. If you're not diligent, you'll eat everything you burn and then some. Use it, to lose it. Cardio gets all the exercise glory, but strength and interval training are the real heroes. They help you build lean muscle, which in turn increases your metabolism and calorie-burning ability 3. Don't Overreact to Mild Hunger Some people have a hard time losing weight because of hunger anxiety. To them, being hungry is bad-something to be avoided at all costs-so they carry snacks with them and eat when they don't need to. Others eat because they're stressed out or bored. While you never want to get to the point of being ravenous (that's when bingeing is likely to happen), a hunger pang, a craving, or the fact that it's 3:00 p.m. should not send you racing for the vending machine or obsessing about the energy bar in your purse. Ideally, you should put off eating until your stomach is growling and it's difficult to concentrate.  Use it to lose it. When you feel the urge to eat, use the HALT method. Ask yourself, Am I  really hungry? Or am I angry or anxious, lonely or bored, or tired? If you're still not certain, try the apple test. If you're truly hungry, an apple should seem delicious; if it doesn't, something else is going on. Or you can try drinking water and making yourself busy, if you are still hungry try a healthy snack.  4. Not All Calories Are Created Equal The  mechanics of weight loss are pretty simple: Take in fewer calories than you use for energy. But the kind of food you eat makes all the difference. Processed food that's high in saturated fat and refined starch or sugar can cause inflammation that disrupts the hormone signals that tell your brain you're full. The result: You eat a lot more.  Use it to lose it. Clean up your diet. Swap in whole, unprocessed foods, including vegetables, lean protein, and healthy fats that will fill you up and give you the biggest nutritional bang for your calorie buck. In a few weeks, as your brain starts receiving regular hunger and fullness signals once again, you'll notice that you feel less hungry overall and naturally start cutting back on the amount you eat.  5. Protein, Produce, and Plant-Based Fats Are Your Weight-Loss Trinity Here's why eating the three Ps regularly will help you drop pounds. Protein fills you up. You need it to build lean muscle, which keeps your metabolism humming so that you can torch more fat. People in a weight-loss program who ate double the recommended daily allowance for protein (about 110 grams for a 150-pound woman) lost 70 percent of their weight from fat, while people who ate the RDA lost only about 40 percent, one study found. Produce is packed with filling fiber. "It's very difficult to consume too many calories if you're eating a lot of vegetables. Example: Three cups of broccoli is a lot of food, yet only 93 calories. (Fruit is another story. It can be easy to overeat and can contain a lot of calories from sugar, so be sure to monitor your intake.) Plant-based fats like olive oil and those in avocados and nuts are healthy and extra satiating.  Use it to lose it. Aim to incorporate each of the three Ps into every meal and snack. People who eat protein throughout the day are able to keep weight off, according to a study in the [place] [employer] Clinical Nutrition. In addition to meat,  poultry and seafood, good sources are beans, lentils, eggs, tofu, and yogurt. As for fat, keep portion sizes in check by measuring out salad dressing, oil, and nut butters (shoot for one to two tablespoons). Finally, eat veggies or a little fruit at every meal. People who did that consumed 308 fewer calories but didn't feel any hungrier than when they didn't eat more produce.  7. How You Eat Is As Important As What You Eat In order for your brain to register that you're full, you need to focus on what you're eating. Sit down whenever you eat, preferably at a table. Turn off the TV or computer, put down your phone, and look at your food. Smell it. Chew slowly, and don't put another bite on your fork until you swallow. When women ate lunch this attentively, they consumed 30 percent less when snacking later than those who listened to an audiobook at lunchtime, according to a study in the [place] of Nutrition. 8. Weighing Yourself Really Works The scale provides the best evidence about whether your efforts are paying off. Seeing  the numbers tick up or down or stagnate is motivation to keep going-or to rethink your approach. A 2015 study at [facility] found that daily weigh-ins helped people lose more weight, keep it off, and maintain that loss, even after two years. Use it to lose it. Step on the scale at the same time every day for the best results. If your weight shoots up several pounds from one weigh-in to the next, don't freak out. Eating a lot of salt the night before or having your period is the likely culprit. The number should return to normal in a day or two. It's a steady climb that you need to do something about. 9. Too Much Stress and Too Little Sleep Are Your Enemies When you're tired and frazzled, your body cranks up the production of cortisol, the stress hormone that can cause carb cravings. Not getting enough sleep also boosts your levels of ghrelin, a hormone associated with  hunger, while suppressing leptin, a hormone that signals fullness and satiety. People on a diet who slept only five and a half hours a night for two weeks lost 55 percent less fat and were hungrier than those who slept eight and a half hours, according to a study in the [place]. Use it to lose it. Prioritize sleep, aiming for seven hours or more a night, which research shows helps lower stress. And make sure you're getting quality zzz's. If a snoring spouse or a fidgety cat wakes you up frequently throughout the night, you may end up getting the equivalent of just four hours of sleep, according to a study from [facility]. Keep pets out of the bedroom, and use a white-noise app to drown out snoring. 10. You Will Hit a plateau-And You Can Bust Through It As you slim down, your body releases much less leptin, the fullness hormone.  If you're not strength training, start right now. Building muscle can raise your metabolism to help you overcome a plateau. To keep your body challenged and burning calories, incorporate new moves and more intense intervals into your workouts or add another sweat session to your weekly routine. Alternatively, cut an extra 100 calories or so a day from your diet. Now that you've lost weight, your body simply doesn't need as much fuel.    Since food equals calories, in order to lose weight you must either eat fewer calories, exercise more to burn off calories with activity, or both. Food that is not used to fuel the body is stored as fat. A major component of losing weight is to make smarter food choices. Here's how:  1)   Limit non-nutritious foods, such as: Sugar, honey, syrups and candy Pastries, donuts, pies, cakes and cookies Soft drinks, sweetened juices and alcoholic beverages  2)  Cut down on high-fat foods by: - Choosing poultry, fish or lean red meat - Choosing low-fat cooking methods, such as baking, broiling, steaming, grilling  and boiling - Using low-fat or non-fat dairy products - Using vinaigrette, herbs, lemon or fat-free salad dressings - Avoiding fatty meats, such as bacon, sausage, franks, ribs and luncheon meats - Avoiding high-fat snacks like nuts, chips and chocolate - Avoiding fried foods - Using less butter, margarine, oil and mayonnaise - Avoiding high-fat gravies, cream sauces and cream-based soups  3) Eat a variety of foods, including: - Fruit and vegetables that are raw, steamed or baked - Whole grains, breads, cereal, rice and pasta - Dairy products, such as low-fat or  non-fat milk or yogurt, low-fat cottage cheese and low-fat cheese - Protein-rich foods like chicken, [place], fish, lean meat and legumes, or beans  4) Change your eating habits by: - Eat three balanced meals a day to help control your hunger - Watch portion sizes and eat small servings of a variety of foods - Choose low-calorie snacks - Eat only when you are hungry and stop when you are satisfied - Eat slowly and try not to perform other tasks while eating - Find other activities to distract you from food, such as walking, taking up a hobby or being involved in the community - Include regular exercise in your daily routine ( minimum of 20 min of moderate-intensity exercise at least 5 days/week)  - Find a support group, if necessary, for emotional support in your weight loss journey           Easy ways to cut 100 calories   1. Eat your eggs with hot sauce OR salsa instead of cheese.  Eggs are great for breakfast, but many people consider eggs and cheese to be BFFs. Instead of cheese-1 oz. of cheddar has 114 calories-top your eggs with hot sauce, which contains no calories and helps with satiety and metabolism. Salsa is also a great option!!  2. Top your toast, waffles or pancakes with fresh berries instead of jelly or syrup. Half a cup of berries-fresh, frozen or thawed-has about 40 calories, compared with 2 tbsp. of  maple syrup or jelly, which both have about 100 calories. The berries will also give you a good punch of fiber, which helps keep you full and satisfied and won't spike blood sugar quickly like the jelly or syrup. 3. Swap the non-fat latte for black coffee with a splash of half-and-half. Contrary to its name, that non-fat latte has 130 calories and a startling 19g of carbohydrates per 16 oz. serving. Replacing that 'light' drinkable dessert with a black coffee with a splash of half-and-half saves you more than 100 calories per 16 oz. serving. 4. Sprinkle salads with freeze-dried raspberries instead of dried cranberries. If you want a sweet addition to your nutritious salad, stay away from dried cranberries. They have a whopping 130 calories per  cup and 30g carbohydrates. Instead, sprinkle freeze-dried raspberries guilt-free and save more than 100 calories per  cup serving, adding 3g of belly-filling fiber. 5. Go for mustard in place of mayo on your sandwich. Mustard can add really nice flavor to any sandwich, and there are tons of varieties, from spicy to honey. A serving of mayo is 95 calories, versus 10 calories in a serving of mustard.  Or try an avocado mayo spread: You can find the recipe few click this link: https://www.californiaavocado.com/recipes/recipe-container/[place]-avocado-mayo 6. Choose a DIY salad dressing instead of the store-bought kind. Mix Dijon or whole grain mustard with low-fat Kefir or red wine vinegar and garlic. 7. Use hummus as a spread instead of a dip. Use hummus as a spread on a high-fiber cracker or tortilla with a sandwich and save on calories without sacrificing taste. 8. Pick just one salad "accessory." Salad isn't automatically a calorie winner. It's easy to over-accessorize with toppings. Instead of topping your salad with nuts, avocado and cranberries (all three will clock in at 313 calories), just pick one. The next day, choose a different accessory, which  will also keep your salad interesting. You don't wear all your jewelry every day, right? 9. Ditch the white pasta in favor of spaghetti squash. One cup of cooked  spaghetti squash has about 40 calories, compared with traditional spaghetti, which comes with more than 200. Spaghetti squash is also nutrient-dense. It's a good source of fiber and Vitamins A and C, and it can be eaten just like you would eat pasta-with a great tomato sauce and [place] meatballs or with pesto, tofu and spinach, for example. 10. Dress up your chili, soups and stews with non-fat [place] yogurt instead of sour cream. Just a 'dollop' of sour cream can set you back 115 calories and a whopping 12g of fat-seven of which are of the artery-clogging variety. Added bonus: [place] yogurt is packed with muscle-building protein, calcium and B Vitamins. 11. Mash cauliflower instead of mashed potatoes. One cup of traditional mashed potatoes-in all their creamy goodness-has more than 200 calories, compared to mashed cauliflower, which you can typically eat for less than 100 calories per 1 cup serving. Cauliflower is a great source of the antioxidant indole-3-carbinol (I3C), which may help reduce the risk of some cancers, like breast cancer. 12. Ditch the ice cream sundae in favor of a [place] yogurt parfait. Instead of a cup of ice cream or fro-yo for dessert, try 1 cup of nonfat Greek yogurt topped with fresh berries and a sprinkle of cacao nibs. Both toppings are packed with antioxidants, which can help reduce cellular inflammation and oxidative damage. And the comparison is a no-brainer: One cup of ice cream has about 275 calories; one cup of frozen yogurt has about 230; and a cup of Greek yogurt has just 130, plus twice the protein, so you're less likely to return to the freezer for a second helping. 13. Put olive oil in a spray container instead of using it directly from the bottle. Each tablespoon of olive oil is 120 calories and 15g of fat. Use a  mister instead of pouring it straight into the pan or onto a salad. This allows for portion control and will save you more than 100 calories. 14. When baking, substitute canned pumpkin for butter or oil. Canned pumpkin-not pumpkin pie mix-is loaded with Vitamin A, which is important for skin and eye health, as well as immunity. And the comparisons are pretty crazy:  cup of canned pumpkin has about 40 calories, compared to butter or oil, which has more than 800 calories. Yes, 800 calories. Applesauce and mashed banana can also serve as good substitutions for butter or oil, usually in a 1:1 ratio. 15. Top casseroles with high-fiber cereal instead of breadcrumbs. Breadcrumbs are typically made with white bread, while breakfast cereals contain 5-9g of fiber per serving. Not only will you save more than 150 calories per  cup serving, the swap will also keep you more full and you'll get a metabolism boost from the added fiber. 16. Snack on pistachios instead of macadamia nuts. Believe it or not, you get the same amount of calories from 35 pistachios (100 calories) as you would from only five macadamia nuts. 17. Chow down on kale chips rather than potato chips. This is my favorite 'don't knock it 'till you try it' swap. Kale chips are so easy to make at home, and you can spice them up with a little grated parmesan or chili powder. Plus, they're a mere fraction of the calories of potato chips, but with the same crunch factor we crave so often. 18. Add seltzer and some fruit slices to your cocktail instead of soda or fruit juice. One cup of soda or fruit juice can pack on as much as 140 calories. Instead,  use seltzer and fruit slices. The fruit provides valuable phytochemicals, such as flavonoids and anthocyanins, which help to combat cancer and stave off the aging process.

## 2017-02-09 ENCOUNTER — Other Ambulatory Visit (HOSPITAL_COMMUNITY): Payer: Self-pay

## 2017-02-09 DIAGNOSIS — E790 Hyperuricemia without signs of inflammatory arthritis and tophaceous disease: Secondary | ICD-10-CM | POA: Diagnosis not present

## 2017-02-09 DIAGNOSIS — M25461 Effusion, right knee: Secondary | ICD-10-CM | POA: Diagnosis not present

## 2017-02-09 DIAGNOSIS — M549 Dorsalgia, unspecified: Secondary | ICD-10-CM | POA: Diagnosis not present

## 2017-02-09 DIAGNOSIS — M25569 Pain in unspecified knee: Secondary | ICD-10-CM | POA: Diagnosis not present

## 2017-02-09 DIAGNOSIS — M2550 Pain in unspecified joint: Secondary | ICD-10-CM | POA: Diagnosis not present

## 2017-02-15 ENCOUNTER — Encounter (HOSPITAL_COMMUNITY): Payer: Self-pay

## 2017-02-15 ENCOUNTER — Other Ambulatory Visit: Payer: Self-pay

## 2017-02-15 ENCOUNTER — Encounter (HOSPITAL_COMMUNITY)
Admission: RE | Admit: 2017-02-15 | Discharge: 2017-02-15 | Disposition: A | Discharge status: Home / Self Care | Payer: Medicare Other | Source: Ambulatory Visit | Attending: General Surgery | Admitting: General Surgery

## 2017-02-15 ENCOUNTER — Other Ambulatory Visit (HOSPITAL_COMMUNITY): Payer: Self-pay | Admitting: *Deleted

## 2017-02-15 DIAGNOSIS — Z01818 Encounter for other preprocedural examination: Secondary | ICD-10-CM | POA: Diagnosis not present

## 2017-02-15 DIAGNOSIS — C50912 Malignant neoplasm of unspecified site of left female breast: Secondary | ICD-10-CM | POA: Diagnosis not present

## 2017-02-15 DIAGNOSIS — I10 Essential (primary) hypertension: Secondary | ICD-10-CM | POA: Diagnosis not present

## 2017-02-15 DIAGNOSIS — E119 Type 2 diabetes mellitus without complications: Secondary | ICD-10-CM | POA: Insufficient documentation

## 2017-02-15 DIAGNOSIS — R9431 Abnormal electrocardiogram [ECG] [EKG]: Secondary | ICD-10-CM | POA: Insufficient documentation

## 2017-02-15 HISTORY — DX: Presence of automatic (implantable) cardiac defibrillator: Z95.810

## 2017-02-15 HISTORY — DX: Angina pectoris, unspecified: I20.9

## 2017-02-15 HISTORY — DX: Headache: R51

## 2017-02-15 HISTORY — DX: Headache, unspecified: R51.9

## 2017-02-15 HISTORY — DX: Unspecified convulsions: R56.9

## 2017-02-15 LAB — COMPREHENSIVE METABOLIC PANEL
ALBUMIN: 3.5 g/dL (ref 3.5–5.0)
ALK PHOS: 73 U/L (ref 38–126)
ALT: 12 U/L — AB (ref 14–54)
AST: 19 U/L (ref 15–41)
Anion gap: 9 (ref 5–15)
BUN: 17 mg/dL (ref 6–20)
CALCIUM: 8.8 mg/dL — AB (ref 8.9–10.3)
CHLORIDE: 100 mmol/L — AB (ref 101–111)
CO2: 27 mmol/L (ref 22–32)
Creatinine, Ser: 1.01 mg/dL — ABNORMAL HIGH (ref 0.44–1.00)
GFR calc non Af Amer: >60 mL/min (ref >60)
GLUCOSE: 95 mg/dL (ref 65–99)
Potassium: 4.1 mmol/L (ref 3.5–5.1)
SODIUM: 136 mmol/L (ref 135–145)
Total Bilirubin: 1.2 mg/dL (ref 0.3–1.2)
Total Protein: 7.7 g/dL (ref 6.5–8.1)

## 2017-02-15 LAB — CBC WITH DIFFERENTIAL/PLATELET
BASOS PCT: 0 %
Basophils Absolute: 0.1 10*3/uL (ref 0.0–0.1)
EOS ABS: 0.1 10*3/uL (ref 0.0–0.7)
EOS PCT: 1 %
HCT: 44.7 % (ref 36.0–46.0)
HEMOGLOBIN: 14.5 g/dL (ref 12.0–15.0)
Lymphocytes Relative: 28 %
Lymphs Abs: 3.5 10*3/uL (ref 0.7–4.0)
MCH: 30.6 pg (ref 26.0–34.0)
MCHC: 32.4 g/dL (ref 30.0–36.0)
MCV: 94.3 fL (ref 78.0–100.0)
Monocytes Absolute: 0.9 10*3/uL (ref 0.1–1.0)
Monocytes Relative: 7 %
NEUTROS PCT: 64 %
Neutro Abs: 8 10*3/uL — ABNORMAL HIGH (ref 1.7–7.7)
PLATELETS: 186 10*3/uL (ref 150–400)
RBC: 4.74 MIL/uL (ref 3.87–5.11)
RDW: 14.3 % (ref 11.5–15.5)
WBC: 12.6 10*3/uL — AB (ref 4.0–10.5)

## 2017-02-15 LAB — GLUCOSE, CAPILLARY: Glucose-Capillary: 131 mg/dL — ABNORMAL HIGH (ref 65–99)

## 2017-02-15 LAB — HEMOGLOBIN A1C
Hgb A1c MFr Bld: 6.1 % — ABNORMAL HIGH (ref 4.8–5.6)
MEAN PLASMA GLUCOSE: 128.37 mg/dL

## 2017-02-15 NOTE — Pre-Procedure Instructions (Addendum)
[name]  02/15/2017      [id number] [address], [place] [address] Phone: [phone] Fax: [phone]   Your procedure is scheduled on Monday, February 21, 2017 at 11:00 AM.   Report to [facility] Entrance "A" Admitting Office at 9:00 AM.   Call this number if you have problems the morning of surgery: [phone]   Questions prior to day of surgery, please call [phone] between 8 & 4 PM.   Remember:  Do not eat food or drink liquids after midnight Sunday, 02/20/17.       Take these medicines the morning of surgery with A SIP OF WATER: Carvedilol (CoregA), Digoxin (Lanoxin), Divalproex (Depakote), Duloxetine (Cymbalta), Ivabradine (Corlanor), Letrozole (Femara), Loratadine (Claritin), Omeprazole (Prilosec), Tylenol - if needed, Albuterol inhaler - if needed (bring inhaler with you day of surgery)  Stop Multivitamins, Fish Oil and Herbal medications 5 days prior to surgery. Do not use NSAIDS (Ibuprofen, Aleve, etc) and Aspirin products 5 days prior to surgery.  Drink 8 ounce bottle of water by 7 AM the day of surgery (2 hours prior to arrival for surgery)   How to Manage Your Diabetes Before Surgery   Why is it important to control my blood sugar before and after surgery?   Improving blood sugar levels before and after surgery helps healing and can limit problems.  A way of improving blood sugar control is eating a healthy diet by:  - Eating less sugar and carbohydrates  - Increasing activity/exercise  - Talk with your doctor about reaching your blood sugar goals  High blood sugars (greater than 180 mg/dL) can raise your risk of infections and slow down your recovery so you will need to focus on controlling your diabetes during the weeks before surgery.  Make sure that the doctor who takes care of your diabetes knows about your planned surgery including the date and location.  How do I  manage my blood sugars before surgery?   Check your blood sugar at least 4 times a day, 2 days before surgery to make sure that they are not too high or low.  Check your blood sugar the morning of your surgery when you wake up and every 2 hours until you get to the Short-Stay unit.  Treat a low blood sugar (less than 70 mg/dL) with 1/2 cup of clear juice (cranberry or apple), 4 glucose tablets, OR glucose gel.  Recheck blood sugar in 15 minutes after treatment (to make sure it is greater than 70 mg/dL).  If blood sugar is not greater than 70 mg/dL on re-check, call [phone] for further instructions.   Report your blood sugar to the Short-Stay nurse when you get to Short-Stay.  References:  [facility], 2007 "How to Manage your Diabetes Before and After Surgery".   Do not wear jewelry, make-up or nail polish.  Do not wear lotions, powders, perfumes or deodorant.  Do not shave 48 hours prior to surgery.   Do not bring valuables to the hospital.  [facility] is not responsible for any belongings or valuables.  Contacts, dentures or bridgework may not be worn into surgery.  Leave your suitcase in the car.  After surgery it may be brought to your room.  For patients admitted to the hospital, discharge time will be determined by your treatment team.  Patients discharged the day of surgery will not be allowed to drive home.  See "Preparing for Surgery" Instruction sheet.   Please read over the fact sheets that you were given.

## 2017-02-15 NOTE — Progress Notes (Addendum)
PCP:Dr. Mellody Dance Cardiologist: Dr. Caryl Comes and Dr, Haroldine Laws Pulmonologist: Dr. Halford Chessman Neurologist: Dr. Bridgett Larsson  Fasting sugars : pt. Doesn't check sugars and her meter doesn't have any strips--"they cost too much" Pt. Is diet controlled.  Spoke to [employer] @ Dr. Darrel Hoover office of  pt. Having an ICD procedure at 0730 on 02/21/17, if he is aware and if Dr Dalbert Batman wants pt. To drink 8 oz. Of water per ERAS protocol day of surgery and if so what time to drink it. Chemira will follow up with pt.   Notified Tomi Bamberger with [employer] of pt's surgery date and both surgery times.

## 2017-02-15 NOTE — Pre-Procedure Instructions (Signed)
[name]  02/15/2017      [id number] [address], [place] [address] Phone: [phone] Fax: [phone]   Your procedure is scheduled on Monday, February 21, 2017 at 11:00 AM.   Report to [facility] Entrance "A" Admitting Office at 9:00 AM.   Call this number if you have problems the morning of surgery: [phone]   Questions prior to day of surgery, please call [phone] between 8 & 4 PM.   Remember:  Do not eat food or drink liquids after midnight Sunday, 02/20/17.  Take these medicines the morning of surgery with A SIP OF WATER: Carvedilol (CoregA), Digoxin (Lanoxin), Divalproex (Depakote), Duloxetine (Cymbalta), Ivabradine (Corlanor), Letrozole (Femara), Loratadine (Claritin), Omeprazole (Prilosec), Tylenol - if needed, Albuterol inhaler - if needed (bring inhaler with you day of surgery)  Stop Multivitamins, Fish Oil and Herbal medications 5 days prior to surgery. Do not use NSAIDS (Ibuprofen, Aleve, etc) and Aspirin products 5 days prior to surgery.  Drink 8 ounce bottle of water by 7 AM the day of surgery (2 hours prior to arrival for surgery)   How to Manage Your Diabetes Before Surgery   Why is it important to control my blood sugar before and after surgery?   Improving blood sugar levels before and after surgery helps healing and can limit problems.  A way of improving blood sugar control is eating a healthy diet by:  - Eating less sugar and carbohydrates  - Increasing activity/exercise  - Talk with your doctor about reaching your blood sugar goals  High blood sugars (greater than 180 mg/dL) can raise your risk of infections and slow down your recovery so you will need to focus on controlling your diabetes during the weeks before surgery.  Make sure that the doctor who takes care of your diabetes knows about your planned surgery including the date and location.  How do I manage  my blood sugars before surgery?   Check your blood sugar at least 4 times a day, 2 days before surgery to make sure that they are not too high or low.  Check your blood sugar the morning of your surgery when you wake up and every 2 hours until you get to the Short-Stay unit.  Treat a low blood sugar (less than 70 mg/dL) with 1/2 cup of clear juice (cranberry or apple), 4 glucose tablets, OR glucose gel.  Recheck blood sugar in 15 minutes after treatment (to make sure it is greater than 70 mg/dL).  If blood sugar is not greater than 70 mg/dL on re-check, call [phone] for further instructions.   Report your blood sugar to the Short-Stay nurse when you get to Short-Stay.  References:  [facility], 2007 "How to Manage your Diabetes Before and After Surgery".   Do not wear jewelry, make-up or nail polish.  Do not wear lotions, powders, perfumes or deodorant.  Do not shave 48 hours prior to surgery.   Do not bring valuables to the hospital.  [facility] is not responsible for any belongings or valuables.  Contacts, dentures or bridgework may not be worn into surgery.  Leave your suitcase in the car.  After surgery it may be brought to your room.  For patients admitted to the hospital, discharge time will be determined by your treatment team.  Patients discharged the day of surgery will not be allowed to drive home.   See "Preparing for Surgery"  Instruction sheet.   Please read over the fact sheets that you were given.

## 2017-02-16 NOTE — Progress Notes (Signed)
Anesthesia Chart Review:  Pt is a 46 year old female scheduled for L breast lumpectomy with radioactive seed and L axillary deep sentinel lymph node biopsy on 02/21/2017 at 10:45am with Fanny Skates, MD  Pt is also scheduled for ICD revision on 02/21/17 at 7:30am with Virl Axe, MD. Notes in epic indicate procedures were scheduled this way intentionally; ICD needs to be repositioned for radiation.   - PCP is Mellody Dance, DO - Oncologist is Lurline Del, MD  - HF cardiologist is Glori Bickers, MD who is aware of upcoming surgery.  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 Never smoker. S/p R breast lumpectomy 08/26/11.   Medications include: albuterol, carvedilol, digoxin, depakote, iron, ivabradine, losartan, metolazone, prilosec, elmiron, potassium, spironolactone, torsemide  BP (!) 103/58   Pulse 76   Temp 36.4 C   Resp 20   Ht 5' 8.5" (1.74 m)   Wt 261 lb 12.8 oz (118.8 kg)   SpO2 97%   BMI 39.23 kg/m    Preoperative labs reviewed.  HbA1c 6.1, glucose 95.   EKG 02/15/17: NSR. RAl enlargement. LAFB. LVH with repolarization abnormality new since previous tracing  Echo 10/28/16:  - Left ventricle: The cavity size was severely dilated. Wall thickness was normal. Systolic function was severely reduced. The estimated ejection fraction was in the range of 20% to 25%. Diffuse hypokinesis. Doppler parameters are consistent with both elevated ventricular end-diastolic filling pressure and elevated left atrial filling pressure. - Aortic valve: There was trivial regurgitation. - Mitral valve: There was moderate regurgitation. - Right atrium: The atrium was moderately dilated. - Atrial septum: No  defect or patent foramen ovale was identified. - Tricuspid valve: There was severe regurgitation. - Pulmonary arteries: PA peak pressure: 46 mm Hg (S).  Cardiopulmonary exercise test 09/17/16:  - Exercise testing with gas exchange demonstrates severe functional impairment when compared to matched sedentary norms. Patient is severely limited due to heart failure. Pre-exercise spirometry demonstrates mixed restrictive/obstructive lung physiology. Patient's weight is also playing a significant role in her exercise intolerance. Several parameters predict severe worsening of short-term prognosis. In addition, there was a severely blunted BP response.  Reviewed case with Dr. Ermalene Postin.   If no changes, I anticipate pt can proceed with surgery as scheduled.   Willeen Cass, FNP-BC [facility]iology Phone: [phone] 02/16/2017 11:42 AM

## 2017-02-17 ENCOUNTER — Ambulatory Visit
Admission: RE | Admit: 2017-02-17 | Discharge: 2017-02-17 | Disposition: A | Discharge status: Home / Self Care | Payer: Medicare Other | Source: Ambulatory Visit | Attending: General Surgery | Admitting: General Surgery

## 2017-02-17 DIAGNOSIS — M25569 Pain in unspecified knee: Secondary | ICD-10-CM | POA: Diagnosis not present

## 2017-02-17 DIAGNOSIS — M79672 Pain in left foot: Secondary | ICD-10-CM | POA: Diagnosis not present

## 2017-02-17 DIAGNOSIS — M2550 Pain in unspecified joint: Secondary | ICD-10-CM | POA: Diagnosis not present

## 2017-02-17 DIAGNOSIS — D0511 Intraductal carcinoma in situ of right breast: Secondary | ICD-10-CM | POA: Diagnosis not present

## 2017-02-17 DIAGNOSIS — M79671 Pain in right foot: Secondary | ICD-10-CM | POA: Diagnosis not present

## 2017-02-17 DIAGNOSIS — M549 Dorsalgia, unspecified: Secondary | ICD-10-CM | POA: Diagnosis not present

## 2017-02-17 DIAGNOSIS — Z170 Estrogen receptor positive status [ER+]: Principal | ICD-10-CM

## 2017-02-17 DIAGNOSIS — M25461 Effusion, right knee: Secondary | ICD-10-CM | POA: Diagnosis not present

## 2017-02-17 DIAGNOSIS — C50212 Malignant neoplasm of upper-inner quadrant of left female breast: Secondary | ICD-10-CM

## 2017-02-17 DIAGNOSIS — M25561 Pain in right knee: Secondary | ICD-10-CM | POA: Diagnosis not present

## 2017-02-17 DIAGNOSIS — M79642 Pain in left hand: Secondary | ICD-10-CM | POA: Diagnosis not present

## 2017-02-17 DIAGNOSIS — M79641 Pain in right hand: Secondary | ICD-10-CM | POA: Diagnosis not present

## 2017-02-17 DIAGNOSIS — M25562 Pain in left knee: Secondary | ICD-10-CM | POA: Diagnosis not present

## 2017-02-18 ENCOUNTER — Encounter (HOSPITAL_COMMUNITY): Payer: Self-pay

## 2017-02-20 NOTE — H&P (Signed)
[name] Location: [facility] Patient #: [id number] DOB: 08/04/1970 Married / Language: English / Race: Black or African American Female        History of Present Illness       The patient is a 46 year old female who presents with breast cancer. This is a 46 year old female who returns for a second visit to discuss definitive management of her left breast cancer. She is strongly motivated for breast conservation surgery which is reasonable. Dr. Darnelle Catalan is her oncologist. Dr. Basilio Cairo has seen her from radiation oncology. Dr. Berton Mount is her electrophysiologist. Dr. Gala Romney is her cardiologist. [name], FNP, is her primary care provider Nechama Guard. Dr. Stefano Gaul is her gynecologist. Imaging studies were done at [facility].      She has a history of right breast cancer diagnosed 2008. Dr. Francina Ames performed right breast lumpectomy and sentinel node biopsy on August 22, 2006 for a T2, N1 invasive carcinoma, grade 3, receptor positive, HER-2 negative. She had completion ALND on September 07, 2006 but there were no additional positive nodes with a total of 17 nodes sampled. 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 She had adjuvant chemotherapy followed by radiation therapy followed by letrozole which was completed in March 2013. She had a right breast biopsy by Dr. Luisa Hart August 26, 2011 showed benign findings       Mammograms were performed on December 07, 2016 when she thought she had a palpable mass in the right breast. On the right side imaging studies suggested stable fat necrosis but recommended 3 month follow-up. In the left breast they saw a new 8 mm mass at the 9:30 position, 6 cm from the nipple. Axilla was negative. 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 ER 100%, PR 0, HER-2 negative.  Genetic testing is negative. Biopsy of the right breast mass is benign fat necrosis  Comorbidities  include history of right breast cancer with adjuvant chemotherapy and radiation therapy. Systolic CHF. Nonischemic cardiomyopathy with EF 20-25%. AICD in place on the left side. Seizure disorder. 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 Depression. Fibromyalgia. Hypertension. History laparoscopic cholecystectomy. 2 laparoscopies for endometriosis. Port-A-Cath has been removed. Cesarean section. Partial hysterectomy.  Family history is negative for breast cancer. Paternal grandmother had ovarian cancer. Paternal uncle had colon cancer.  Social history reveals she is married with 1 daughter. Husband present throughout the encounter today. She is unemployed. Denies tobacco. Drinks alcohol occasionally        She has seen Dr. Gala Romney who states that she is high risk but acceptable for surgery.  I have discussed her care with Dr. Lonie Peak and Dr. Hurman Horn. Genetic testing was sent yesterday. She wants lumpectomy. She does not want mastectomy. We talked about this for over 30 minutes. .       Surgery will be scheduled at [facility]. In the morning, we will coordinate with Dr. Graciela Husbands, who will move her AICD from the left to the right infraclavicular area Later the same day I'll perform left breast lumpectomy with radioactive seed localization and left axillary sentinel lymph node biopsy. Once again I discussed the indications, details, techniques, and numerous risk of surgery with her and her husband. She is aware of the risk of bleeding, infection, reoperation for positive margins or positive nodes, cosmetic deformity, nerve damage with chronic pain. She understands all of these issues. All of  her questions were answered. She agrees with this plan. Surgery will be followed by whole breast radiation therapy, to be followed by antiestrogen therapy. She is not a candidate for adjuvant  chemotherapy due to her cardiac issues. Addendum Note genetic testing reported 02/02/2017 revealed no deleterious mutations   Past Surgical History  Breast Biopsy  Bilateral. Breast Mass; Local Excision  Right. Cesarean Section - 1  Gallbladder Surgery - Laparoscopic  Hysterectomy (not due to cancer) - Partial  Oral Surgery  Sentinel Lymph Node Biopsy  Tonsillectomy   Diagnostic Studies History  Colonoscopy  5-10 years ago Mammogram  within last year Pap Smear  1-5 years ago  Allergies ACE Inhibitors  Reglan *GASTROINTESTINAL AGENTS - MISC.*  Adhesive Tape *MEDICAL DEVICES AND SUPPLIES*  Chlorhexidine *PHARMACEUTICAL ADJUVANTS*  Allergies Reconciled   Medication History  Tylenol (325MG  Tablet, Oral) Active. Albuterol Sulfate HFA (108 (90 Base)MCG/ACT Aerosol Soln, Inhalation) Active. Baclofen (10MG  Tablet,  Oral) Active. Carvedilol (25MG  Tablet, Oral) Active. Cholecalciferol (5000UNIT Capsule, Oral) Active. Coenzyme Q10 (100MG  Capsule, Oral) Active. Cyanocobalamin ( Tablet, Oral) Active. Digoxin-Tab (0.125MG  Tablet, Oral) Active. Divalproex Sodium (250MG  Tablet DR, Oral) Active. DULoxetine HCl (30MG  Capsule DR Part, Oral) Active. Ferrous Sulfate (325 (65 Fe)MG Tablet, Oral) Active. HydrOXYzine HCl (25MG  Tablet, Oral) Active. Ivabradine HCl (5MG  Tablet, Oral) Active. Loratadine (10MG  (Rapid) Tablet, Oral) Active. Losartan Potassium (25MG  Tablet, Oral) Active. MetOLazone (2.5MG  Tablet, Oral) Active. Milk Thistle (150MG  Capsule, Oral) Active. Multiple Vitamin (Oral) Active. Omeprazole (40MG  Capsule DR, Oral) Active. Ondansetron (4MG  Tablet, Oral) Active. Pentosan Polysulfate Sodium (100MG  Capsule, Oral) Active. Potassium Bicarbonate ( Tablet Effer, Oral) Active. Spironolactone (25MG  Tablet, Oral) Active. Torsemide (20MG  Tablet, Oral) Active. TraZODone HCl (50MG  Tablet, Oral) Active. Medications Reconciled  Social History  Alcohol use  Occasional alcohol use. Caffeine use  Carbonated beverages, Coffee, Tea. No drug use  Tobacco use  Never smoker.  Family History  Arthritis  Father, Mother. Bleeding disorder  Mother. Cerebrovascular Accident  Mother. Colon Polyps  Mother. Heart Disease  Mother. Heart disease in female family member before age 85  Hypertension  Mother, Sister. Ischemic Bowel Disease  Father, Mother. Migraine Headache  Sister.  Pregnancy / Birth History  Age at menarche  13 years. Age of menopause  <45 Gravida  1 Irregular periods  Length (months) of breastfeeding  3-6 Maternal age  61-30 Para  1  Other Problems  Anxiety Disorder  Back Pain  Bladder Problems  Breast Cancer  Chest pain  Cholelithiasis  Cirrhosis Of Liver  Congestive Heart Failure  Depression  Diabetes Mellitus  Gastric  Ulcer  Gastroesophageal Reflux Disease  Heart murmur  Lump In Breast  Migraine Headache  Seizure Disorder  Transfusion history     Review of Systems  General Present- Fatigue, Night Sweats and Weight Gain. Not Present- Appetite Loss, Chills, Fever and Weight Loss. Skin Not Present- Change in Wart/Mole, Dryness, Hives, Jaundice, New Lesions, Non-Healing Wounds, Rash and Ulcer. HEENT Present- Hoarseness, Seasonal Allergies, Sinus Pain and Sore Throat. Not Present- Earache, Hearing Loss, Nose Bleed, Oral Ulcers, Ringing in the Ears, Visual Disturbances, Wears glasses/contact lenses and Yellow Eyes. Respiratory Present- Bloody sputum, Difficulty Breathing and Wheezing. Not Present- Chronic Cough and Snoring. Breast Present- Breast Mass and Breast Pain. Not Present- Nipple Discharge and Skin Changes. Cardiovascular Present- Chest Pain, Difficulty Breathing Lying Down, Leg Cramps, Palpitations, Rapid Heart Rate, Shortness of Breath and Swelling of Extremities. Gastrointestinal Present- Abdominal Pain, Bloating, Constipation, Indigestion and Nausea. Not Present- Bloody Stool, Change in Bowel Habits, Chronic  diarrhea, Difficulty Swallowing, Excessive gas, Gets full quickly at meals, Hemorrhoids, Rectal Pain and Vomiting. Female Genitourinary Present- Frequency, Painful Urination, Pelvic Pain and Urgency. Not Present- Nocturia. Musculoskeletal Present- Back Pain, Joint Pain, Joint Stiffness, Muscle Pain, Muscle Weakness and Swelling of Extremities. Neurological Present- Decreased Memory, Headaches, Numbness, Seizures, Tingling, Tremor, Trouble walking and Weakness. Not Present- Fainting. Psychiatric Present- Anxiety, Change in Sleep Pattern and Depression. Not Present- Bipolar, Fearful and Frequent crying. Endocrine Present- Heat Intolerance and Hot flashes. Not Present- Cold Intolerance, Excessive Hunger, Hair Changes and New Diabetes. Hematology Present- Easy Bruising. Not Present- Blood  Thinners, Excessive bleeding, Gland problems, HIV and Persistent Infections.  Vitals  Weight: 272.2 lb Height: 67in Body Surface Area: 2.31 m Body Mass Index: 42.63 kg/m  Temp.: 98.50F  Pulse: 78 (Regular)  BP: 120/88 (Sitting, Left Arm, Standard)   Physical Exam  General Mental Status-Alert. General Appearance-Not in acute distress. Build & Nutrition-Well nourished. Posture-Normal posture. Gait-Normal. Note: BMI 42. Husband present throughout encounter.   Head and Neck Head-normocephalic, atraumatic with no lesions or palpable masses. Trachea-midline. Thyroid Gland Characteristics - normal size and consistency and no palpable nodules.  Chest and Lung Exam Chest and lung exam reveals -on auscultation, normal breath sounds, no adventitious sounds and normal vocal resonance. Note: AICD palpable left infraclavicular area. 2 transverse scars one presumably from prior Port-A-Cath.   Breast Note: Right breast is a good deal smaller than the left. Transverse lumpectomy scar right breast at 9 o'clock position with some depression of the incision. Right axillary incision with depression of the right axilla. Soft, nontender lymphedema right arm. In the right breast medially at the 3 o'clock position there is a 1 cm palpable area of thickening where the fat necrosis is. In the left breast there is a biopsy scar superiorly but no palpable mass or hematoma. Nipple and areola complex is normal. No axillary mass.   Cardiovascular Cardiovascular examination reveals -normal heart sounds, regular rate and rhythm with no murmurs and femoral artery auscultation bilaterally reveals normal pulses, no bruits, no thrills.  Abdomen Inspection Inspection of the abdomen reveals - No Hernias. Palpation/Percussion Palpation and Percussion of the abdomen reveal - Soft, Non Tender, No Rigidity (guarding), No hepatosplenomegaly and No Palpable abdominal masses. Note:  Pfannenstiel incision healed. Abdominal trocar sites including umbilicus healed. No organomegaly or tenderness.   Neurologic Neurologic evaluation reveals -alert and oriented x 3 with no impairment of recent or remote memory, normal attention span and ability to concentrate, normal sensation and normal coordination.  Musculoskeletal Normal Exam - Bilateral-Upper Extremity Strength Normal and Lower Extremity Strength Normal.     Assessment & Plan  PRIMARY CANCER OF UPPER INNER QUADRANT OF LEFT FEMALE BREAST (C50.212)    You have seen Dr. Darnelle Catalan from medical oncology. you have seen Dr. Basilio Cairo from radiation oncology. you hAVE seen your cardiologist Dr. Gala Romney who has stated that you are high-risk but that we may go ahead with the surgery. We have discussed your case with Dr. Hurman Horn, who states he will need to move your AICD from the left side to the right side.  The biopsy of your right breast shows benign fat necrosis. You do not need any surgery on the right breast.  Genetic testing is negative.  We have discussed options for intervention including lumpectomy and sentinel node biopsy and adjuvant radiation therapy. We have also discussed unilateral and bilateral mastectomy. We have discussed the influence of genetic mutations on recurrence and survival, as well as their influence  on your relatives and children. Genetic testing was negative in 2008. Pathologic mutations clearly can increase the chance of recurrent or second cancers later on, but as long as you're followed closely the do not influence survival.  You have stated that she would like to proceed with left breast lumpectomy and sentinel node biopsy. That is appropriate  you will be scheduled for surgery in the near future We will plan to bring [facility] to [facility] in the morning and coordinate with Dr. Berton Mount who plans to move your AICD device from the left side to the right side. Later in the  day I will perform the left breast lumpectomy and left axillary sentinel node biopsy I discussed the indications, techniques, details, and risks of the surgery in detail. You're aware of the risk of bleeding, infection, reoperation for positive margins or positive nodes, cosmetic deformity, skin necrosis, cardiac pulmonary and thromboembolic problems. You understand these issues and all questions have been answered. 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 Derrell Lolling, M.D., [facility] General and Minimally invasive Surgery Breast and Colorectal Surgery Office:   [phone] Pager:   [phone]

## 2017-02-21 ENCOUNTER — Ambulatory Visit (HOSPITAL_COMMUNITY)
Admission: RE | Admit: 2017-02-21 | Discharge: 2017-02-21 | Disposition: A | Discharge status: Home / Self Care | Payer: Medicare Other | Source: Ambulatory Visit | Attending: General Surgery | Admitting: General Surgery

## 2017-02-21 ENCOUNTER — Encounter (HOSPITAL_COMMUNITY)
Admission: RE | Admit: 2017-02-21 | Discharge: 2017-02-21 | Disposition: A | Discharge status: Home / Self Care | Payer: Medicare Other | Source: Ambulatory Visit | Attending: General Surgery | Admitting: General Surgery

## 2017-02-21 ENCOUNTER — Ambulatory Visit (HOSPITAL_COMMUNITY): Payer: Medicare Other | Admitting: Certified Registered"

## 2017-02-21 ENCOUNTER — Encounter (HOSPITAL_COMMUNITY): Payer: Self-pay

## 2017-02-21 ENCOUNTER — Encounter (HOSPITAL_COMMUNITY)
Admission: RE | Disposition: A | Discharge status: Home / Self Care | Payer: Self-pay | Source: Ambulatory Visit | Attending: General Surgery

## 2017-02-21 ENCOUNTER — Ambulatory Visit
Admission: RE | Admit: 2017-02-21 | Discharge: 2017-02-21 | Disposition: A | Discharge status: Home / Self Care | Payer: Medicare Other | Source: Ambulatory Visit | Attending: General Surgery | Admitting: General Surgery

## 2017-02-21 ENCOUNTER — Ambulatory Visit (HOSPITAL_COMMUNITY): Payer: Medicare Other | Admitting: Emergency Medicine

## 2017-02-21 DIAGNOSIS — E119 Type 2 diabetes mellitus without complications: Secondary | ICD-10-CM | POA: Diagnosis not present

## 2017-02-21 DIAGNOSIS — F329 Major depressive disorder, single episode, unspecified: Secondary | ICD-10-CM | POA: Diagnosis not present

## 2017-02-21 DIAGNOSIS — C50212 Malignant neoplasm of upper-inner quadrant of left female breast: Secondary | ICD-10-CM

## 2017-02-21 DIAGNOSIS — I5022 Chronic systolic (congestive) heart failure: Secondary | ICD-10-CM | POA: Insufficient documentation

## 2017-02-21 DIAGNOSIS — C50912 Malignant neoplasm of unspecified site of left female breast: Secondary | ICD-10-CM | POA: Diagnosis present

## 2017-02-21 DIAGNOSIS — I428 Other cardiomyopathies: Secondary | ICD-10-CM | POA: Insufficient documentation

## 2017-02-21 DIAGNOSIS — Z923 Personal history of irradiation: Secondary | ICD-10-CM | POA: Diagnosis not present

## 2017-02-21 DIAGNOSIS — R928 Other abnormal and inconclusive findings on diagnostic imaging of breast: Secondary | ICD-10-CM | POA: Diagnosis not present

## 2017-02-21 DIAGNOSIS — Z6841 Body Mass Index (BMI) 40.0 and over, adult: Secondary | ICD-10-CM | POA: Diagnosis not present

## 2017-02-21 DIAGNOSIS — C50811 Malignant neoplasm of overlapping sites of right female breast: Secondary | ICD-10-CM | POA: Diagnosis not present

## 2017-02-21 DIAGNOSIS — Z4502 Encounter for adjustment and management of automatic implantable cardiac defibrillator: Secondary | ICD-10-CM | POA: Diagnosis not present

## 2017-02-21 DIAGNOSIS — J449 Chronic obstructive pulmonary disease, unspecified: Secondary | ICD-10-CM | POA: Insufficient documentation

## 2017-02-21 DIAGNOSIS — Z8041 Family history of malignant neoplasm of ovary: Secondary | ICD-10-CM | POA: Insufficient documentation

## 2017-02-21 DIAGNOSIS — Z170 Estrogen receptor positive status [ER+]: Principal | ICD-10-CM

## 2017-02-21 DIAGNOSIS — Z9221 Personal history of antineoplastic chemotherapy: Secondary | ICD-10-CM | POA: Insufficient documentation

## 2017-02-21 DIAGNOSIS — Z79899 Other long term (current) drug therapy: Secondary | ICD-10-CM | POA: Diagnosis not present

## 2017-02-21 DIAGNOSIS — R55 Syncope and collapse: Secondary | ICD-10-CM | POA: Diagnosis not present

## 2017-02-21 DIAGNOSIS — K219 Gastro-esophageal reflux disease without esophagitis: Secondary | ICD-10-CM | POA: Diagnosis not present

## 2017-02-21 DIAGNOSIS — I110 Hypertensive heart disease with heart failure: Secondary | ICD-10-CM | POA: Diagnosis not present

## 2017-02-21 DIAGNOSIS — E785 Hyperlipidemia, unspecified: Secondary | ICD-10-CM | POA: Diagnosis not present

## 2017-02-21 DIAGNOSIS — I890 Lymphedema, not elsewhere classified: Secondary | ICD-10-CM | POA: Diagnosis not present

## 2017-02-21 DIAGNOSIS — G8918 Other acute postprocedural pain: Secondary | ICD-10-CM | POA: Diagnosis not present

## 2017-02-21 HISTORY — PX: ICD REVISION: EP1209

## 2017-02-21 HISTORY — PX: BREAST LUMPECTOMY WITH RADIOACTIVE SEED AND SENTINEL LYMPH NODE BIOPSY: SHX6550

## 2017-02-21 LAB — GLUCOSE, CAPILLARY
GLUCOSE-CAPILLARY: 133 mg/dL — AB (ref 65–99)
Glucose-Capillary: 104 mg/dL — ABNORMAL HIGH (ref 65–99)

## 2017-02-21 LAB — SURGICAL PCR SCREEN
MRSA, PCR: NEGATIVE
STAPHYLOCOCCUS AUREUS: POSITIVE — AB

## 2017-02-21 SURGERY — BREAST LUMPECTOMY WITH RADIOACTIVE SEED AND SENTINEL LYMPH NODE BIOPSY
Anesthesia: General | Site: Breast | Laterality: Left

## 2017-02-21 SURGERY — ICD REVISION

## 2017-02-21 MED ORDER — EPHEDRINE 5 MG/ML INJ
INTRAVENOUS | Status: AC
  Filled 2017-02-21: qty 10

## 2017-02-21 MED ORDER — LACTATED RINGERS IV SOLN: INTRAVENOUS | Status: DC

## 2017-02-21 MED ORDER — LIDOCAINE HCL (PF) 1 % IJ SOLN
INTRAMUSCULAR | Status: AC
  Filled 2017-02-21: qty 60

## 2017-02-21 MED ORDER — DEXAMETHASONE SODIUM PHOSPHATE 10 MG/ML IJ SOLN
INTRAMUSCULAR | Status: DC | PRN
  Administered 2017-02-21: 5 mg via INTRAVENOUS

## 2017-02-21 MED ORDER — CEFAZOLIN SODIUM-DEXTROSE 2-3 GM-% IV SOLR
INTRAVENOUS | Status: DC | PRN
  Administered 2017-02-21: 2 g via INTRAVENOUS

## 2017-02-21 MED ORDER — PHENYLEPHRINE 40 MCG/ML (10ML) SYRINGE FOR IV PUSH (FOR BLOOD PRESSURE SUPPORT)
PREFILLED_SYRINGE | INTRAVENOUS | Status: AC
  Filled 2017-02-21: qty 10

## 2017-02-21 MED ORDER — METHYLENE BLUE 0.5 % INJ SOLN
INTRAVENOUS | Status: DC | PRN
  Administered 2017-02-21: 5 mL via INTRAMUSCULAR

## 2017-02-21 MED ORDER — DEXAMETHASONE SODIUM PHOSPHATE 10 MG/ML IJ SOLN
INTRAMUSCULAR | Status: AC
  Filled 2017-02-21: qty 1

## 2017-02-21 MED ORDER — SODIUM CHLORIDE 0.9 % IV SOLN: INTRAVENOUS | Status: DC

## 2017-02-21 MED ORDER — TECHNETIUM TC 99M SULFUR COLLOID FILTERED
1.0000 | Freq: Once | INTRAVENOUS | Status: AC | PRN
  Administered 2017-02-21: 1 via INTRADERMAL

## 2017-02-21 MED ORDER — ONDANSETRON HCL 4 MG/2ML IJ SOLN
INTRAMUSCULAR | Status: AC
  Filled 2017-02-21: qty 2

## 2017-02-21 MED ORDER — LIDOCAINE HCL (PF) 1 % IJ SOLN
INTRAMUSCULAR | Status: AC
  Filled 2017-02-21: qty 30

## 2017-02-21 MED ORDER — MUPIROCIN 2 % EX OINT
1.0000 "application " | TOPICAL_OINTMENT | Freq: Once | CUTANEOUS | Status: AC
  Administered 2017-02-21: 1 via TOPICAL

## 2017-02-21 MED ORDER — EPHEDRINE SULFATE-NACL 50-0.9 MG/10ML-% IV SOSY
PREFILLED_SYRINGE | INTRAVENOUS | Status: DC | PRN
  Administered 2017-02-21 (×2): 5 mg via INTRAVENOUS

## 2017-02-21 MED ORDER — MIDAZOLAM HCL 5 MG/5ML IJ SOLN
INTRAMUSCULAR | Status: DC | PRN
  Administered 2017-02-21 (×2): 1 mg via INTRAVENOUS
  Administered 2017-02-21 (×2): 2 mg via INTRAVENOUS
  Administered 2017-02-21 (×2): 1 mg via INTRAVENOUS

## 2017-02-21 MED ORDER — MIDAZOLAM HCL 5 MG/5ML IJ SOLN
INTRAMUSCULAR | Status: AC
  Filled 2017-02-21: qty 5

## 2017-02-21 MED ORDER — OXYCODONE HCL 5 MG PO TABS
5.0000 mg | ORAL_TABLET | ORAL | Status: DC | PRN
  Administered 2017-02-21: 10 mg via ORAL

## 2017-02-21 MED ORDER — MUPIROCIN 2 % EX OINT
TOPICAL_OINTMENT | CUTANEOUS | Status: AC
  Administered 2017-02-21: 1 via TOPICAL
  Filled 2017-02-21: qty 22

## 2017-02-21 MED ORDER — FENTANYL CITRATE (PF) 250 MCG/5ML IJ SOLN
INTRAMUSCULAR | Status: AC
Start: 2017-02-21 — End: ?
  Filled 2017-02-21: qty 5

## 2017-02-21 MED ORDER — SODIUM CHLORIDE 0.9% FLUSH: 3.0000 mL | INTRAVENOUS | Status: DC | PRN

## 2017-02-21 MED ORDER — MIDAZOLAM HCL 2 MG/2ML IJ SOLN
INTRAMUSCULAR | Status: AC
  Filled 2017-02-21: qty 2

## 2017-02-21 MED ORDER — PROPOFOL 10 MG/ML IV BOLUS
INTRAVENOUS | Status: AC
  Filled 2017-02-21: qty 20

## 2017-02-21 MED ORDER — ONDANSETRON HCL 4 MG/2ML IJ SOLN
INTRAMUSCULAR | Status: DC | PRN
  Administered 2017-02-21: 4 mg via INTRAVENOUS

## 2017-02-21 MED ORDER — SODIUM CHLORIDE 0.9% FLUSH: 3.0000 mL | Freq: Two times a day (BID) | INTRAVENOUS | Status: DC

## 2017-02-21 MED ORDER — ROCURONIUM BROMIDE 10 MG/ML (PF) SYRINGE
PREFILLED_SYRINGE | INTRAVENOUS | Status: AC
  Filled 2017-02-21: qty 5

## 2017-02-21 MED ORDER — FENTANYL CITRATE (PF) 250 MCG/5ML IJ SOLN
INTRAMUSCULAR | Status: AC
  Filled 2017-02-21: qty 5

## 2017-02-21 MED ORDER — CEFAZOLIN SODIUM-DEXTROSE 2-4 GM/100ML-% IV SOLN
INTRAVENOUS | Status: AC
  Filled 2017-02-21: qty 100

## 2017-02-21 MED ORDER — ACETAMINOPHEN 650 MG RE SUPP: 650.0000 mg | RECTAL | Status: DC | PRN

## 2017-02-21 MED ORDER — GENTAMICIN SULFATE 40 MG/ML IJ SOLN
INTRAMUSCULAR | Status: DC | PRN
  Administered 2017-02-21: 09:00:00

## 2017-02-21 MED ORDER — ACETAMINOPHEN 500 MG PO TABS: 1000.0000 mg | ORAL_TABLET | ORAL | Status: DC

## 2017-02-21 MED ORDER — ETOMIDATE 2 MG/ML IV SOLN
INTRAVENOUS | Status: AC
  Filled 2017-02-21: qty 10

## 2017-02-21 MED ORDER — FENTANYL CITRATE (PF) 100 MCG/2ML IJ SOLN
INTRAMUSCULAR | Status: AC
  Filled 2017-02-21: qty 2

## 2017-02-21 MED ORDER — LIDOCAINE HCL (PF) 1 % IJ SOLN
INTRAMUSCULAR | Status: DC | PRN
  Administered 2017-02-21: 45 mL

## 2017-02-21 MED ORDER — PROPOFOL 10 MG/ML IV BOLUS
INTRAVENOUS | Status: DC | PRN
  Administered 2017-02-21: 200 mg via INTRAVENOUS

## 2017-02-21 MED ORDER — CELECOXIB 200 MG PO CAPS: 200.0000 mg | ORAL_CAPSULE | ORAL | Status: DC

## 2017-02-21 MED ORDER — PHENYLEPHRINE HCL 10 MG/ML IJ SOLN
INTRAMUSCULAR | Status: DC | PRN
  Administered 2017-02-21: 25 ug/min via INTRAVENOUS

## 2017-02-21 MED ORDER — SODIUM CHLORIDE 0.9 % IR SOLN
80.0000 mg | Status: AC
  Administered 2017-02-21: 80 mg

## 2017-02-21 MED ORDER — FENTANYL CITRATE (PF) 100 MCG/2ML IJ SOLN
INTRAMUSCULAR | Status: DC | PRN
  Administered 2017-02-21 (×4): 25 ug via INTRAVENOUS

## 2017-02-21 MED ORDER — SODIUM CHLORIDE 0.9 % IV SOLN
INTRAVENOUS | Status: DC
  Administered 2017-02-21: 07:00:00 via INTRAVENOUS

## 2017-02-21 MED ORDER — FENTANYL CITRATE (PF) 250 MCG/5ML IJ SOLN
INTRAMUSCULAR | Status: DC | PRN
  Administered 2017-02-21: 100 ug via INTRAVENOUS

## 2017-02-21 MED ORDER — GABAPENTIN 300 MG PO CAPS: 300.0000 mg | ORAL_CAPSULE | ORAL | Status: DC

## 2017-02-21 MED ORDER — SODIUM CHLORIDE 0.9 % IV SOLN: 250.0000 mL | INTRAVENOUS | Status: DC | PRN

## 2017-02-21 MED ORDER — ACETAMINOPHEN 325 MG PO TABS: 650.0000 mg | ORAL_TABLET | ORAL | Status: DC | PRN

## 2017-02-21 MED ORDER — HYDROCODONE-ACETAMINOPHEN 5-325 MG PO TABS: 1.0000 | ORAL_TABLET | Freq: Four times a day (QID) | ORAL | 0 refills | Status: DC | PRN

## 2017-02-21 MED ORDER — LIDOCAINE 2% (20 MG/ML) 5 ML SYRINGE
INTRAMUSCULAR | Status: AC
  Filled 2017-02-21: qty 5

## 2017-02-21 MED ORDER — LIDOCAINE 2% (20 MG/ML) 5 ML SYRINGE
INTRAMUSCULAR | Status: DC | PRN
  Administered 2017-02-21: 40 mg via INTRAVENOUS

## 2017-02-21 MED ORDER — CHLORHEXIDINE GLUCONATE 4 % EX LIQD: 60.0000 mL | Freq: Once | CUTANEOUS | Status: DC

## 2017-02-21 MED ORDER — BUPIVACAINE-EPINEPHRINE (PF) 0.25% -1:200000 IJ SOLN
INTRAMUSCULAR | Status: AC
  Filled 2017-02-21: qty 30

## 2017-02-21 MED ORDER — HEPARIN (PORCINE) IN NACL 2-0.9 UNIT/ML-% IJ SOLN
INTRAMUSCULAR | Status: AC | PRN
  Administered 2017-02-21: 500 mL

## 2017-02-21 MED ORDER — ONDANSETRON HCL 4 MG/2ML IJ SOLN: INTRAMUSCULAR | Status: DC | PRN

## 2017-02-21 MED ORDER — METHYLENE BLUE 0.5 % INJ SOLN
INTRAVENOUS | Status: AC
  Filled 2017-02-21: qty 10

## 2017-02-21 MED ORDER — OXYCODONE HCL 5 MG PO TABS
ORAL_TABLET | ORAL | Status: DC
Start: 2017-02-21 — End: 2017-02-21
  Filled 2017-02-21: qty 2

## 2017-02-21 MED ORDER — CEFAZOLIN SODIUM-DEXTROSE 2-4 GM/100ML-% IV SOLN
2.0000 g | INTRAVENOUS | Status: AC
  Administered 2017-02-21: 2 g via INTRAVENOUS

## 2017-02-21 MED ORDER — FENTANYL CITRATE (PF) 100 MCG/2ML IJ SOLN: 25.0000 ug | INTRAMUSCULAR | Status: DC | PRN

## 2017-02-21 MED ORDER — GENTAMICIN SULFATE 40 MG/ML IJ SOLN
INTRAMUSCULAR | Status: AC
  Filled 2017-02-21: qty 2

## 2017-02-21 MED ORDER — SUCCINYLCHOLINE CHLORIDE 200 MG/10ML IV SOSY
PREFILLED_SYRINGE | INTRAVENOUS | Status: AC
  Filled 2017-02-21: qty 10

## 2017-02-21 MED ORDER — PHENYLEPHRINE 40 MCG/ML (10ML) SYRINGE FOR IV PUSH (FOR BLOOD PRESSURE SUPPORT)
PREFILLED_SYRINGE | INTRAVENOUS | Status: DC | PRN
  Administered 2017-02-21: 120 ug via INTRAVENOUS
  Administered 2017-02-21: 200 ug via INTRAVENOUS
  Administered 2017-02-21: 80 ug via INTRAVENOUS

## 2017-02-21 MED ORDER — SODIUM CHLORIDE 0.9 % IR SOLN
Status: AC
  Filled 2017-02-21: qty 2

## 2017-02-21 MED ORDER — BUPIVACAINE-EPINEPHRINE 0.25% -1:200000 IJ SOLN
INTRAMUSCULAR | Status: DC | PRN
  Administered 2017-02-21: 20 mL

## 2017-02-21 MED ORDER — 0.9 % SODIUM CHLORIDE (POUR BTL) OPTIME
TOPICAL | Status: DC | PRN
  Administered 2017-02-21: 1000 mL

## 2017-02-21 MED ORDER — SODIUM CHLORIDE 0.9 % IJ SOLN
INTRAMUSCULAR | Status: AC
  Filled 2017-02-21: qty 10

## 2017-02-21 MED ORDER — CEFAZOLIN SODIUM 1 G IJ SOLR
INTRAMUSCULAR | Status: AC
  Filled 2017-02-21: qty 20

## 2017-02-21 SURGICAL SUPPLY — 48 items
APPLIER CLIP 9.375 MED OPEN (MISCELLANEOUS) ×3
BINDER BREAST 3XL (GAUZE/BANDAGES/DRESSINGS) ×3 IMPLANT
BLADE SURG 15 STRL LF DISP TIS (BLADE) ×2 IMPLANT
BLADE SURG 15 STRL SS (BLADE) ×4
CANISTER SUCT 3000ML PPV (MISCELLANEOUS) ×3 IMPLANT
CHLORAPREP W/TINT 26ML (MISCELLANEOUS) ×3 IMPLANT
CLIP APPLIE 9.375 MED OPEN (MISCELLANEOUS) ×1 IMPLANT
CONT SPEC 4OZ CLIKSEAL STRL BL (MISCELLANEOUS) ×9 IMPLANT
COVER PROBE W GEL 5X96 (DRAPES) ×3 IMPLANT
COVER SURGICAL LIGHT HANDLE (MISCELLANEOUS) ×3 IMPLANT
DERMABOND ADVANCED (GAUZE/BANDAGES/DRESSINGS) ×2
DERMABOND ADVANCED .7 DNX12 (GAUZE/BANDAGES/DRESSINGS) ×1 IMPLANT
DEVICE DUBIN SPECIMEN MAMMOGRA (MISCELLANEOUS) ×3 IMPLANT
DRAPE CHEST BREAST 15X10 FENES (DRAPES) ×3 IMPLANT
DRAPE HALF SHEET 40X57 (DRAPES) ×3 IMPLANT
DRAPE UTILITY XL STRL (DRAPES) ×3 IMPLANT
DRSG PAD ABDOMINAL 8X10 ST (GAUZE/BANDAGES/DRESSINGS) ×3 IMPLANT
DRSG TEGADERM 4X4.75 (GAUZE/BANDAGES/DRESSINGS) ×6 IMPLANT
ELECT CAUTERY BLADE 6.4 (BLADE) ×3 IMPLANT
ELECT REM PT RETURN 9FT ADLT (ELECTROSURGICAL) ×3
ELECTRODE REM PT RTRN 9FT ADLT (ELECTROSURGICAL) ×1 IMPLANT
FILTER STRAW FLUID ASPIR (MISCELLANEOUS) ×3 IMPLANT
GAUZE SPONGE 4X4 12PLY STRL (GAUZE/BANDAGES/DRESSINGS) ×3 IMPLANT
GLOVE EUDERMIC 7 POWDERFREE (GLOVE) ×3 IMPLANT
GOWN STRL REUS W/ TWL LRG LVL3 (GOWN DISPOSABLE) ×1 IMPLANT
GOWN STRL REUS W/ TWL XL LVL3 (GOWN DISPOSABLE) ×1 IMPLANT
GOWN STRL REUS W/TWL LRG LVL3 (GOWN DISPOSABLE) ×2
GOWN STRL REUS W/TWL XL LVL3 (GOWN DISPOSABLE) ×2
KIT BASIN OR (CUSTOM PROCEDURE TRAY) ×3 IMPLANT
KIT MARKER MARGIN INK (KITS) ×3 IMPLANT
NDL SAFETY ECLIPSE 18X1.5 (NEEDLE) ×1 IMPLANT
NEEDLE HYPO 18GX1.5 SHARP (NEEDLE) ×2
NEEDLE HYPO 25GX1X1/2 BEV (NEEDLE) ×3 IMPLANT
NS IRRIG 1000ML POUR BTL (IV SOLUTION) ×3 IMPLANT
PACK SURGICAL SETUP 50X90 (CUSTOM PROCEDURE TRAY) ×3 IMPLANT
PAD ABD 8X10 STRL (GAUZE/BANDAGES/DRESSINGS) ×3 IMPLANT
PENCIL BUTTON HOLSTER BLD 10FT (ELECTRODE) ×3 IMPLANT
SPONGE LAP 4X18 X RAY DECT (DISPOSABLE) ×3 IMPLANT
SUT MNCRL AB 4-0 PS2 18 (SUTURE) ×6 IMPLANT
SUT SILK 2 0 SH (SUTURE) ×3 IMPLANT
SUT VIC AB 3-0 SH 18 (SUTURE) ×6 IMPLANT
SYR BULB 3OZ (MISCELLANEOUS) ×3 IMPLANT
SYR CONTROL 10ML LL (SYRINGE) ×3 IMPLANT
TOWEL OR 17X24 6PK STRL BLUE (TOWEL DISPOSABLE) ×3 IMPLANT
TOWEL OR 17X26 10 PK STRL BLUE (TOWEL DISPOSABLE) ×3 IMPLANT
TUBE CONNECTING 12'X1/4 (SUCTIONS) ×1
TUBE CONNECTING 12X1/4 (SUCTIONS) ×2 IMPLANT
YANKAUER SUCT BULB TIP NO VENT (SUCTIONS) ×3 IMPLANT

## 2017-02-21 SURGICAL SUPPLY — 7 items
CABLE SURGICAL S-101-97-12 (CABLE) ×3 IMPLANT
DEVICE DISSECT PLASMABLAD 3.0S (MISCELLANEOUS) ×1 IMPLANT
HEMOSTAT SURGICEL 2X4 FIBR (HEMOSTASIS) ×6 IMPLANT
ICD VISIA MRI DVFB1D1 (ICD Generator) ×3 IMPLANT
PAD DEFIB LIFELINK (PAD) ×3 IMPLANT
PLASMABLADE 3.0S (MISCELLANEOUS) ×3
TRAY PACEMAKER INSERTION (PACKS) ×3 IMPLANT

## 2017-02-21 NOTE — Anesthesia Procedure Notes (Signed)
Procedure Name: LMA Insertion Date/Time: 02/21/2017 12:05 PM Performed by: Freddie Breech Pre-anesthesia Checklist: Patient identified, Emergency Drugs available, Suction available and Patient being monitored Patient Re-evaluated:Patient Re-evaluated prior to induction Oxygen Delivery Method: Circle System Utilized Preoxygenation: Pre-oxygenation with 100% oxygen Induction Type: IV induction Ventilation: Mask ventilation without difficulty LMA: LMA inserted LMA Size: 5.0 Number of attempts: 1 Placement Confirmation: positive ETCO2 and breath sounds checked- equal and bilateral Tube secured with: Tape Dental Injury: Teeth and Oropharynx as per pre-operative assessment

## 2017-02-21 NOTE — Progress Notes (Signed)
Pt only needs one IV on the left side per Dr. Caryl Comes.

## 2017-02-21 NOTE — H&P (Signed)
Patient Care Team: Mellody Dance, DO as PCP - General (Family Medicine) Bensimhon, Shaune Pascal, MD as Consulting Physician (Cardiology) Magrinat, Virgie Dad, MD as Consulting Physician (Oncology) Deboraha Sprang, MD as Consulting Physician (Cardiology) Dohmeier, Asencion Partridge, MD as Consulting Physician (Neurology) Chesley Mires, MD as Consulting Physician (Pulmonary Disease) Fanny Skates, MD as Consulting Physician (General Surgery) Eppie Gibson, MD as Attending Physician (Radiation Oncology) Ena Dawley, MD as Consulting Physician (Obstetrics and Gynecology) Latanya Maudlin, MD as Consulting Physician (Orthopedic Surgery) Domingo Pulse, MD as Consulting Physician (Urology)   HPI  [name] is a 46 y.o. female Seen for ICD generator replacement and repositioning of her ICD generator from Left to RIght  Originally implanted in May 2013 for primary prevention with nonischemic cardiomyopathy and a narrow  QRS     Echocardiogram 5/17 EF 20-25% with mild RV dysfunction thought 2/2 chemo for breast cancer.  Also felt to have conversion disorder   Recently been diagnosed with a new breast cancer requiring radiation therapy on the left  Records and Results Reviewed  Echo 5/18 EF 20-25  CHF notes reviewed  Past Medical History:  Diagnosis Date  . Acne   . AICD (automatic cardioverter/defibrillator) present   . Anemia 1980's   [id number]  . Anginal pain ([place])    sporatic , been going on for yrs,  . Anxiety   . Arthritis   . 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 Blood transfusion 1980's   1987 or 1988  . Breast calcifications    right breast  . Breast cancer (HCC)    right;  . CHF (congestive heart failure) ([place])    due to non-ischemic cardiomyopathy, thought to be chemotherapy induced;  cath 7/12: normal cors, EF 20-25%. Cardiac MRI 05/2011 EF 32%. ICD implantation 10/2011 ([employer])  . 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 Conversion disorder   . COPD with  asthma ([place]) 11/15/2012  . Depression   . Endometriosis   . Family history of colon cancer   . Family history of ovarian cancer   . Fibromyalgia   . GERD (gastroesophageal reflux disease)   . Headache   . Hepatomegaly   . History of stomach ulcers   . Hyperlipidemia   . Hypertension    c/b orthostatic hypotention  . ICD (implantable cardiac defibrillator) in place   . Insomnia   . Interstitial cystitis   . Left eye injury 12/2014  . 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   . Nonischemic cardiomyopathy ([place])    related to chemo; EF 30% 05/2011  . Obesity   . Palpitation    normal sinus rhythm only on 21 day heart monitor  . Panic attacks   . Peripheral neuropathy    chemo- induced  . Pneumonia 2000's   "once"  . Seasonal allergies   . Seizures ([place])    last seizure  last week: non elipsey seisure  . Type II diabetes mellitus ([place])     Past Surgical History:  Procedure Laterality Date  . ABDOMINAL HYSTERECTOMY     partial  . BREAST EXCISIONAL BIOPSY     right benign 2012  . BREAST LUMPECTOMY  2008; 2013   right radiation  . CARDIAC DEFIBRILLATOR PLACEMENT  11/03/11  . CESAREAN SECTION    . CHOLECYSTECTOMY  ~ 2009  . COLONOSCOPY    . IMPLANTABLE CARDIOVERTER DEFIBRILLATOR IMPLANT N/A 11/03/2011   Procedure: IMPLANTABLE CARDIOVERTER DEFIBRILLATOR IMPLANT;  Surgeon: Revonda Standard  Caryl Comes, MD; MDT   . LAPAROSCOPIC ENDOMETRIOSIS FULGURATION  1990's  . NASAL SINUS SURGERY    . [facility] REMOVAL  2010?   left chest; placed in 2008  . TONSILLECTOMY  1980's  . TUBAL LIGATION  1990's    Current Facility-Administered Medications  Medication Dose Route Frequency Provider Last Rate Last Dose  . 0.9 %  sodium chloride infusion   Intravenous Continuous Deboraha Sprang, MD      . 0.9 %  sodium chloride infusion   Intravenous Continuous Deboraha Sprang, MD      . acetaminophen (TYLENOL) tablet 1,000 mg  1,000 mg  Oral On Call to OR Fanny Skates, MD      . ceFAZolin (ANCEF) IVPB 2g/100 mL premix  2 g Intravenous On Call Deboraha Sprang, MD      . celecoxib (CELEBREX) capsule 200 mg  200 mg Oral On Call to OR Fanny Skates, MD      . chlorhexidine (HIBICLENS) 4 % liquid 4 application  60 mL Topical Once Deboraha Sprang, MD      . gabapentin (NEURONTIN) capsule 300 mg  300 mg Oral On Call to OR Fanny Skates, MD      . gentamicin (GARAMYCIN) 80 mg in sodium chloride irrigation 0.9 % 500 mL irrigation  80 mg Irrigation On Call Deboraha Sprang, MD      . mupirocin ointment (BACTROBAN) 2 % 1 application  1 application Topical Once Fanny Skates, MD      . mupirocin ointment (BACTROBAN) 2 %             Allergies  Allergen Reactions  . Ace Inhibitors Other (See Comments)    hyperkalemia  . Reglan [Metoclopramide Hcl] Anxiety  . Adhesive [Tape] Itching and Rash    Specifically tegaderm, normal adhesive tape is ok      Social History  Substance Use Topics  . Smoking status: Never Smoker  . Smokeless tobacco: Never Used  . Alcohol use Yes     Comment: social drinker      Family History  Problem Relation Age of Onset  . Heart disease Mother   . Arthritis Mother   . Hypertension Mother   . Stroke Mother   . Fibromyalgia Mother   . Coronary artery disease Unknown   . Heart attack Unknown   . Heart disease Maternal Uncle   . Colon cancer Paternal Uncle        dx over 51  . Heart disease Maternal Grandmother   . Heart attack Maternal Grandmother   . Ovarian cancer Paternal Grandmother   . Fibromyalgia Sister   . Ovarian cancer Paternal Aunt      No current facility-administered medications on file prior to encounter.    Current Outpatient Prescriptions on File Prior to Encounter  Medication Sig Dispense Refill  . albuterol (PROVENTIL HFA;VENTOLIN HFA) 108 (90 Base) MCG/ACT inhaler Inhale 1-2 puffs into the lungs every 6 (six) hours as needed for wheezing or shortness of breath.       . baclofen (LIORESAL) 10 MG tablet TAKE 1 TABLET BY MOUTH 2 (TWO) TIMES DAILY AS NEEDED FOR MUSCLE SPASMS. 60 tablet 0  . BLACK CURRANT SEED OIL PO Take 15 mLs by mouth daily.     . carvedilol (COREG) 25 MG tablet Take 0.5 tablets (12.5 mg total) by mouth 2 (two) times daily with a meal. 60 tablet 6  . Cholecalciferol (VITAMIN D3) 5000 UNITS CAPS Take 5,000 Units by mouth  daily.     . Coenzyme Q10 (CO Q-10) 100 MG CAPS Take 100 mg by mouth daily.    . Cyanocobalamin 2500 MCG SUBL Place 2,500 mcg under the tongue daily.    . digoxin (LANOXIN) 0.125 MG tablet TAKE 0.5 TABLETS (0.0625 MG TOTAL) BY MOUTH DAILY. 15 tablet 2  . divalproex (DEPAKOTE ER) 500 MG 24 hr tablet Take 1 tablet in a.m. and 2 tablets in p.m. 90 tablet 5  . DULoxetine (CYMBALTA) 30 MG capsule Take 1 capsule (30 mg total) by mouth 2 (two) times daily. 60 capsule 5  . ferrous sulfate 325 (65 FE) MG tablet Take 325 mg by mouth daily with breakfast.    . GLUCOSAMINE-CHONDROITIN PO Take 1,200 mg by mouth daily.    . hydrOXYzine (ATARAX/VISTARIL) 25 MG tablet Take 25 mg by mouth at bedtime as needed for itching (DOESN'T TAKE WITH TRAZODONE).    Marland Kitchen ivabradine (CORLANOR) 5 MG TABS tablet Take 1 tablet (5 mg total) by mouth 2 (two) times daily with a meal. 180 tablet 3  . loratadine (CLARITIN) 10 MG tablet Take 10 mg by mouth daily.    Marland Kitchen losartan (COZAAR) 25 MG tablet Take 1 tablet (25 mg total) by mouth daily. (Patient taking differently: Take 25 mg by mouth at bedtime. ) 90 tablet 3  . metolazone (ZAROXOLYN) 2.5 MG tablet Take 1 tablet (2.5 mg total) by mouth daily as needed (edema). 15 tablet 0  . Milk Thistle 150 MG CAPS Take 450 mg by mouth daily.     . Multiple Vitamin (MULTIVITAMIN WITH MINERALS) TABS Take 1 tablet by mouth daily. Reported on 10/13/2015    . omeprazole (PRILOSEC) 40 MG capsule Take 1 capsule (40 mg total) by mouth 2 (two) times daily before a meal. Breakfast and supper 60 capsule 9  . ondansetron (ZOFRAN) 4 MG tablet  Take 1 tablet (4 mg total) by mouth every 8 (eight) hours as needed for nausea or vomiting. 20 tablet 3  . PATADAY 0.2 % SOLN Place 1 drop into both eyes daily as needed (allergies).     . pentosan polysulfate (ELMIRON) 100 MG capsule Take 100 mg by mouth 2 (two) times daily as needed (bladder spasms).     . potassium chloride SA (K-DUR,KLOR-CON) 20 MEQ tablet Take 2 tablets (40 mEq total) by mouth daily. 180 tablet 3  . spironolactone (ALDACTONE) 25 MG tablet TAKE 0.5 TABLETS (12.5 MG TOTAL) BY MOUTH DAILY. 45 tablet 3  . torsemide (DEMADEX) 20 MG tablet TAKE 2 TABLETS (40 MG TOTAL) BY MOUTH DAILY. 60 tablet 6  . traZODone (DESYREL) 50 MG tablet Take 50 mg by mouth at bedtime as needed for sleep.         Review of Systems negative except from HPI and PMH  Physical Exam BP 116/68   Pulse 89   Temp 99.1 F (37.3 C) (Oral)   Ht 5\' 7"  (1.702 m)   Wt 257 lb (116.6 kg)   SpO2 97%   BMI 40.25 kg/m  Well developed and Morbidly obese  in no acute distress HENT normal E scleral and icterus clear Neck Supple JVP flat; carotids brisk and full Clear to ausculation  Regular rate and rhythm, no murmurs gallops or rub Soft with active bowel sounds No clubbing cyanosis  Edema Alert and oriented, grossly normal motor and sensory function Skin Warm and Dry  Sinus @ 72 19/11/44  Assessment and  Plan  NICM  ICD  Breast Cancer need for L sided  radiation therapy  Morbidly obese   Will transfer generator from L to R and implant a new generator ( 5 years post initial implant)  We have reviewed the benefits and risks of generator replacement.  These include but are not limited to lead fracture and infection.  The patient understands, agrees and is willing to proceed.

## 2017-02-21 NOTE — Op Note (Signed)
Patient Name:           [name]   Date of Surgery:        02/21/2017  Pre op Diagnosis:      Invasive carcinoma left breast, upper inner quadrant, estrogen receptor positive                                       History right breast cancer  Post op Diagnosis:    Same  Procedure:                 Inject blue dye left breast                                      Left partial mastectomy with radioactive seed localization                                      Reexcision superior medial margin                                      Left axillary deep sentinel lymph node biopsy  Surgeon:                     Edsel Petrin. Dalbert Batman, M.D., FACS  Assistant:                      OR staff   Indication for Assistant: N/A  Operative Indications:   This is a 46 year old female who is brought to the operating room for definitive surgical treatment of her left breast cancer.   Dr. Jana Hakim is her oncologist. Dr. Isidore Moos has seen her from radiation oncology. Dr. Jolyn Nap is her electrophysiologist. Dr. Haroldine Laws is her cardiologist. Clarene Reamer, FNP, is her primary care provider Exie Parody. Dr. Raphael Gibney is her gynecologist. .      She has a history of right breast cancer diagnosed 2008. Dr. Marylene Buerger performed right breast lumpectomy and sentinel node biopsy on August 22, 2006 for a T2, N1 invasive carcinoma, grade 3, receptor positive, HER-2 negative. She had completion ALND on September 07, 2006 but there were no additional positive nodes with a total of 17 nodes sampled. 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 She had adjuvant chemotherapy followed by radiation therapy followed by letrozole which was completed in March 2013. She had a right breast biopsy by Dr. Brantley Stage August 26, 2011 showed benign findings       Mammograms were performed on December 07, 2016 when she thought she had a palpable mass in the right breast. On the right side imaging studies suggested stable fat necrosis but recommended 3 month  follow-up. In the left breast they saw a new 8 mm mass at the 9:30 position, 6 cm from the nipple. Axilla was negative. 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 ER 100%, PR 0, HER-2 negative.  Genetic testing is negative. Biopsy of the right breast mass is benign fat necrosis      Comorbidities  include history of right breast cancer with adjuvant chemotherapy and radiation  therapy. Systolic CHF. Nonischemic cardiomyopathy with EF 20-25%. AICD in place on the left side. Seizure disorder. 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 Depression. Fibromyalgia. Hypertension. History laparoscopic cholecystectomy. 2 laparoscopies for endometriosis. Port-A-Cath has been removed. Cesarean section. Partial hysterectomy.  Family history is negative for breast cancer. Paternal grandmother had ovarian cancer. Paternal uncle had colon cancer.        She has seen Dr. Gala Romney who states that she is high risk but acceptable for surgery.  I have discussed her care with Dr. Lonie Peak and Dr. Hurman Horn. .      Dr. Graciela Husbands removed her AICD from the left side and put a new AICD on the right side this morning.  That went well.      Plan for today is breast conservation with left breast lumpectomy and sentinel lymph node biopsy.  Surgery we followed by whole breast radiation therapy, to be followed by antiestrogen therapy.  She is not felt to be a candidate for chemotherapy.   Operative Findings:       She underwent pectoral block and injection of technetium 99 by the appropriate personnel in the holding area.  I observed bilateral infraclavicular incisions which appeared to be fresh without active bleeding or hematoma.  The radioactive seed and the original biopsy clip in the left breast were at the 9:30 position about 7 cm from the nipple.  The specimen  mammogram looked very good with the clip and the seed in the center of the specimen but I felt that I might be a little bit close medially so I re-excised the superior medial margin.  I found 2 sentinel lymph nodes which were not grossly abnormal  Procedure in Detail:          Following the induction of general LMA anesthesia a surgical timeout was performed and additional dose of antibiotics were given.  Following alcohol prep I injected 5 mL of dilute methylene blue in the left breast subareolar area and massage the breast for a few minutes.  The entire left breast and left axilla and chest wall are prepped and draped in a sterile fashion.  0.5% Marcaine with epinephrine was used as local infiltration anesthetic.      Using the neoprobe I  identified the area of maximum radioactivity in the left breast, 9:30 position about 7 cm away from the nipple.  A radially oriented incision was made with the knife and the lumpectomy was performed with electrocautery and the neoprobe.  A specimen was removed, marked with silk sutures and a multi colored ink kit.  The specimen mammogram looked good.      I re-excised the supero-medial margin and sent that as a separate specimen.      After irrigating the wound hemostasis was excellent.  I marked the walls of the lumpectomy cavity with 5 metal clips.  The breast tissues were closed in multiple layers with 3-0 Vicryl sutures and the skin closed with a running subcuticular 4-0 Monocryl and Dermabond.      I switched the neoprobe to the technetium setting.  I made a transverse incision the left axilla at the hairline.  Dissection was carried down through the clavipectoral fascia.  I found 2 obvious sentinel lymph nodes that were hot and blue.  After these were removed there was no more radioactivity or blue dye.  This wound was irrigated.  Bleeders were cauterized.  Deeper tissues were closed with 3-0 Vicryl sutures and the skin  closed with a running subcuticular 4-0 Monocryl  and Dermabond.  Clean bandages and a breast binder were placed.  The patient tolerated the procedure well was taken to PACU in stable condition.  EBL 20 mL.  Counts correct.  Complications none.           Edsel Petrin. Dalbert Batman, M.D., FACS General and Minimally Invasive Surgery Breast and Colorectal Surgery   Addendum: I logged onto the [facility] website and reviewed her prescription medication history  02/21/2017 1:17 PM

## 2017-02-21 NOTE — Interval H&P Note (Signed)
History and Physical Interval Note:  02/21/2017 9:39 AM  [name]  has presented today for surgery, with the diagnosis of LEFT BREAST CANCER  The various methods of treatment have been discussed with the patient and family. After consideration of risks, benefits and other options for treatment, the patient has consented to  Procedure(s): LEFT BREAST LUMPECTOMY WITH RADIOACTIVE SEED AND LEFT AXILLARY DEEP SENTINEL LYMPH NODE BIOPSY (Left) as a surgical intervention .  The patient's history has been reviewed, patient examined, no change in status, stable for surgery.  I have reviewed the patient's chart and labs.  Questions were answered to the patient's satisfaction.     Adin Hector

## 2017-02-21 NOTE — Anesthesia Procedure Notes (Signed)
Anesthesia Regional Block: Pectoralis block   Pre-Anesthetic Checklist: ,, timeout performed, Correct Patient, Correct Site, Correct Laterality, Correct Procedure, Correct Position, site marked, Risks and benefits discussed,  Surgical consent,  Pre-op evaluation,  At surgeon's request and post-op pain management  Laterality: Left  Prep: Maximum Sterile Barrier Precautions used, chloraprep       Needles:  Injection technique: Single-shot  Needle Type: Echogenic Stimulator Needle     Needle Length: 10cm      Additional Needles:   Procedures: ultrasound guided,,,,,,,,  Narrative:  Injection made incrementally with aspirations every 5 mL.  Performed by: Personally  Anesthesiologist: Montez Hageman  Additional Notes: Risks, benefits and alternative to block explained extensively.  Patient tolerated procedure well, without complications.

## 2017-02-21 NOTE — Anesthesia Preprocedure Evaluation (Addendum)
Anesthesia Evaluation  Patient identified by MRN, date of birth, ID band Patient awake    Reviewed: Allergy & Precautions, NPO status , Patient's Chart, lab work & pertinent test results  Airway Mallampati: II  TM Distance: >3 FB Neck ROM: Full    Dental no notable dental hx.    Pulmonary asthma , COPD,    Pulmonary exam normal breath sounds clear to auscultation       Cardiovascular hypertension, Pt. on medications +CHF (chemo induced cardiomyopathy)  Normal cardiovascular exam+ Cardiac Defibrillator  Rhythm:Regular Rate:Normal     Neuro/Psych Seizures -,  Anxiety Depression Conversion disorder. Non-epileptic seizuresnegative neurological ROS  negative psych ROS   GI/Hepatic negative GI ROS, Neg liver ROS,   Endo/Other  diabetesMorbid obesity  Renal/GU negative Renal ROS  negative genitourinary   Musculoskeletal  (+) Fibromyalgia -  Abdominal   Peds negative pediatric ROS (+)  Hematology negative hematology ROS (+)   Anesthesia Other Findings   Reproductive/Obstetrics negative OB ROS                            Anesthesia Physical Anesthesia Plan  ASA: III  Anesthesia Plan: General   Post-op Pain Management:  Regional for Post-op pain   Induction: Intravenous  PONV Risk Score and Plan: 3 and Ondansetron, Dexamethasone, Midazolam and Treatment may vary due to age or medical condition  Airway Management Planned: LMA and Oral ETT  Additional Equipment:   Intra-op Plan:   Post-operative Plan: Extubation in OR  Informed Consent: I have reviewed the patients History and Physical, chart, labs and discussed the procedure including the risks, benefits and alternatives for the proposed anesthesia with the patient or authorized representative who has indicated his/her understanding and acceptance.   Dental advisory given  Plan Discussed with: CRNA  Anesthesia Plan Comments: (PEC  block.  AICD suspended for surgery)       Anesthesia Quick Evaluation

## 2017-02-21 NOTE — Discharge Instructions (Signed)
[facility] °Office Phone Number [phone] ° °BREAST BIOPSY/ PARTIAL MASTECTOMY: POST OP INSTRUCTIONS ° °Always review your discharge instruction sheet given to you by the facility where your surgery was performed. ° °IF YOU HAVE DISABILITY OR FAMILY LEAVE FORMS, YOU MUST BRING THEM TO THE OFFICE FOR PROCESSING.  DO NOT GIVE THEM TO YOUR DOCTOR. ° °1. A prescription for pain medication may be given to you upon discharge.  Take your pain medication as prescribed, if needed.  If narcotic pain medicine is not needed, then you may take acetaminophen (Tylenol) or ibuprofen (Advil) as needed. °2. Take your usually prescribed medications unless otherwise directed °3. If you need a refill on your pain medication, please contact your pharmacy.  They will contact our office to request authorization.  Prescriptions will not be filled after 5pm or on week-ends. °4. You should eat very light the first 24 hours after surgery, such as soup, crackers, pudding, etc.  Resume your normal diet the day after surgery. °5. Most patients will experience some swelling and bruising in the breast.  Ice packs and a good support bra will help.  Swelling and bruising can take several days to resolve.  °6. It is common to experience some constipation if taking pain medication after surgery.  Increasing fluid intake and taking a stool softener will usually help or prevent this problem from occurring.  A mild laxative (Milk of Magnesia or Miralax) should be taken according to package directions if there are no bowel movements after 48 hours. °7. Unless discharge instructions indicate otherwise, you may remove your bandages 24-48 hours after surgery, and you may shower at that time.  You may have steri-strips (small skin tapes) in place directly over the incision.  These strips should be left on the skin for 7-10 days.  If your surgeon used skin glue on the incision, you may shower in 24 hours.  The glue will flake off over the  next 2-3 weeks.  Any sutures or staples will be removed at the office during your follow-up visit. °8. ACTIVITIES:  You may resume regular daily activities (gradually increasing) beginning the next day.  Wearing a good support bra or sports bra minimizes pain and swelling.  You may have sexual intercourse when it is comfortable. °a. You may drive when you no longer are taking prescription pain medication, you can comfortably wear a seatbelt, and you can safely maneuver your car and apply brakes. °b. RETURN TO WORK:  ______________________________________________________________________________________ °9. You should see your doctor in the office for a follow-up appointment approximately two weeks after your surgery.  Your doctor’s nurse will typically make your follow-up appointment when she calls you with your pathology report.  Expect your pathology report 2-3 business days after your surgery.  You may call to check if you do not hear from us after three days. °10. OTHER INSTRUCTIONS: _______________________________________________________________________________________________ _____________________________________________________________________________________________________________________________________ °_____________________________________________________________________________________________________________________________________ °_____________________________________________________________________________________________________________________________________ ° °WHEN TO CALL YOUR DOCTOR: °1. Fever over 101.0 °2. Nausea and/or vomiting. °3. Extreme swelling or bruising. °4. Continued bleeding from incision. °5. Increased pain, redness, or drainage from the incision. ° °The clinic staff is available to answer your questions during regular business hours.  Please don’t hesitate to call and ask to speak to one of the nurses for clinical concerns.  If you have a medical emergency, go to the nearest  emergency room or call 911.  A surgeon from [facility] is always on call at the hospital. ° °For further questions, please visit [email or web address]  °

## 2017-02-21 NOTE — Progress Notes (Signed)
Per Reino Bellis, Surveyor, quantity of cath lab, pre meds should not be given prior to ICD revision. OR staff to administer.

## 2017-02-21 NOTE — Interval H&P Note (Signed)
ICD Criteria  Current LVEF:20%. Within 12 months prior to implant: Yes   Heart failure history: Yes, Class III  Cardiomyopathy history: Yes, Non-Ischemic Cardiomyopathy.  Atrial Fibrillation/Atrial Flutter: No.  Ventricular tachycardia history: No.  Cardiac arrest history: No.  History of syndromes with risk of sudden death: No.  Previous ICD: Yes, Reason for ICD:  Primary prevention.  Current ICD indication: Primary  PPM indication: No.   Class I or II Bradycardia indication present: No  Beta Blocker therapy for 3 or more months: Yes, prescribed.   Ace Inhibitor/ARB therapy for 3 or more months: Yes, prescribed.   History and Physical Interval Note:  02/21/2017 7:19 AM  [name]  has presented today for surgery, with the diagnosis of moving from left to right side - hf - breat cancer - needs radiation  The various methods of treatment have been discussed with the patient and family. After consideration of risks, benefits and other options for treatment, the patient has consented to  Procedure(s): ICD Revision (N/A) as a surgical intervention .  The patient's history has been reviewed, patient examined, no change in status, stable for surgery.  I have reviewed the patient's chart and labs.  Questions were answered to the patient's satisfaction.     Virl Axe

## 2017-02-21 NOTE — Transfer of Care (Signed)
Immediate Anesthesia Transfer of Care Note  Patient: [name]  Procedure(s) Performed: Procedure(s): LEFT BREAST LUMPECTOMY WITH RADIOACTIVE SEED AND LEFT AXILLARY DEEP SENTINEL LYMPH NODE BIOPSY (Left)  Patient Location: PACU  Anesthesia Type:GA combined with regional for post-op pain  Level of Consciousness: drowsy and patient cooperative  Airway & Oxygen Therapy: Patient Spontanous Breathing and Patient connected to face mask oxygen  Post-op Assessment: Report given to RN and Post -op Vital signs reviewed and stable  Post vital signs: Reviewed and stable  Last Vitals:  Vitals:   02/21/17 1330 02/21/17 1335  BP:  (!) 120/52  Pulse: 88 88  Resp: 11 (!) 21  Temp: 36.6 C   SpO2: 96% 93%    Last Pain:  Vitals:   02/21/17 0648  TempSrc: Oral         Complications: No apparent anesthesia complications

## 2017-02-22 ENCOUNTER — Telehealth: Payer: Self-pay | Admitting: *Deleted

## 2017-02-22 ENCOUNTER — Telehealth (HOSPITAL_COMMUNITY): Payer: Self-pay | Admitting: *Deleted

## 2017-02-22 ENCOUNTER — Encounter (HOSPITAL_COMMUNITY): Payer: Self-pay | Admitting: Internal Medicine

## 2017-02-22 NOTE — Anesthesia Postprocedure Evaluation (Signed)
Anesthesia Post Note  Patient: [name]  Procedure(s) Performed: Procedure(s) (LRB): LEFT BREAST LUMPECTOMY WITH RADIOACTIVE SEED AND LEFT AXILLARY DEEP SENTINEL LYMPH NODE BIOPSY (Left)     Patient location during evaluation: PACU Anesthesia Type: General Level of consciousness: awake and alert Pain management: pain level controlled Vital Signs Assessment: post-procedure vital signs reviewed and stable Respiratory status: spontaneous breathing, nonlabored ventilation, respiratory function stable and patient connected to nasal cannula oxygen Cardiovascular status: blood pressure returned to baseline and stable Postop Assessment: no signs of nausea or vomiting Anesthetic complications: no    Last Vitals:  Vitals:   02/21/17 1440 02/21/17 1450  BP:  137/77  Pulse: 87 87  Resp: 15   Temp: (!) 36.1 C   SpO2: 93% 92%    Last Pain:  Vitals:   02/21/17 1440  TempSrc:   PainSc: 0-No pain   Pain Goal:                 [name]

## 2017-02-22 NOTE — Telephone Encounter (Signed)
Received alert for ICD therapies OFF.  Alert was from 9/10 but was received on 9/11 at 1618.  Patient's device was repositioned on 9/10 by Dr. Caryl Comes to her R chest due to breast CA.  Per scanned in report, therapies were re-enabled post-procedure.  Attempted to reach patient to request manual transmission for review, but no answer, VM full.  Able to reach Dannial Monarch, [employer] rep.  Per Tomi Bamberger, patient was given a new Carelink monitor at the hospital and a manual transmission was sent from it to set it up prior to ICD therapies being reactivated post-procedure.  Therapies are ON as intended as of this point per Tomi Bamberger.  Plan to follow-up as scheduled.

## 2017-02-22 NOTE — Telephone Encounter (Signed)
Medication Samples have been provided to the patient.  Drug name: Corlanor      Strength: 5 mg    Qty: 2  LOT: [id number]  Exp.Date: 07/21  Dosing instructions: Take 1 tablet twice daily  The patient has been instructed regarding the correct time, dose, and frequency of taking this medication, including desired effects and most common side effects.   Darron Doom 4:05 PM 02/22/2017

## 2017-02-23 ENCOUNTER — Telehealth (HOSPITAL_COMMUNITY): Payer: Self-pay | Admitting: Pharmacist

## 2017-02-23 NOTE — Telephone Encounter (Signed)
[name] ran out of [place] so I have enrolled her in a 2nd grant for $800 to use toward her Corlanor copay costs.   Patient Name - [name] DOB - Sep 29, 1970 Member ID - [id number] Disease Fund - Heart Failure 2nd grant amount - $800  Total remaining balance - $800  Eligibility End Date - 10/11/2017  Claims Submission End Date - 02/08/2018   [name]. [name], PharmD, BCPS, CPP Clinical Pharmacist Pager: [phone] Phone: [phone] 02/23/2017 9:18 AM

## 2017-02-28 ENCOUNTER — Encounter (HOSPITAL_COMMUNITY): Payer: Self-pay | Admitting: Cardiology

## 2017-03-02 ENCOUNTER — Ambulatory Visit (INDEPENDENT_AMBULATORY_CARE_PROVIDER_SITE_OTHER): Payer: Medicare Other | Admitting: Family Medicine

## 2017-03-02 DIAGNOSIS — R569 Unspecified convulsions: Secondary | ICD-10-CM | POA: Diagnosis not present

## 2017-03-02 DIAGNOSIS — IMO0001 Reserved for inherently not codable concepts without codable children: Secondary | ICD-10-CM

## 2017-03-02 DIAGNOSIS — R7302 Impaired glucose tolerance (oral): Secondary | ICD-10-CM | POA: Diagnosis not present

## 2017-03-02 DIAGNOSIS — M797 Fibromyalgia: Secondary | ICD-10-CM | POA: Diagnosis not present

## 2017-03-02 DIAGNOSIS — R2689 Other abnormalities of gait and mobility: Secondary | ICD-10-CM | POA: Diagnosis not present

## 2017-03-02 DIAGNOSIS — E559 Vitamin D deficiency, unspecified: Secondary | ICD-10-CM | POA: Diagnosis not present

## 2017-03-02 DIAGNOSIS — F449 Dissociative and conversion disorder, unspecified: Secondary | ICD-10-CM

## 2017-03-02 DIAGNOSIS — C50911 Malignant neoplasm of unspecified site of right female breast: Secondary | ICD-10-CM | POA: Diagnosis not present

## 2017-03-02 DIAGNOSIS — F0789 Other personality and behavioral disorders due to known physiological condition: Secondary | ICD-10-CM

## 2017-03-02 DIAGNOSIS — E669 Obesity, unspecified: Secondary | ICD-10-CM | POA: Diagnosis not present

## 2017-03-02 DIAGNOSIS — I5022 Chronic systolic (congestive) heart failure: Secondary | ICD-10-CM

## 2017-03-02 DIAGNOSIS — G620 Drug-induced polyneuropathy: Secondary | ICD-10-CM | POA: Diagnosis not present

## 2017-03-02 DIAGNOSIS — I5023 Acute on chronic systolic (congestive) heart failure: Secondary | ICD-10-CM | POA: Diagnosis not present

## 2017-03-02 DIAGNOSIS — F09 Unspecified mental disorder due to known physiological condition: Secondary | ICD-10-CM | POA: Diagnosis not present

## 2017-03-02 DIAGNOSIS — C50912 Malignant neoplasm of unspecified site of left female breast: Secondary | ICD-10-CM

## 2017-03-02 DIAGNOSIS — I951 Orthostatic hypotension: Secondary | ICD-10-CM | POA: Diagnosis not present

## 2017-03-02 NOTE — Progress Notes (Signed)
Patient came in today for follow up appointment.  Patient was to get labs drawn before the visit and was unable to come in for them.  Patient is going to do blood work today and follow up with Dr. Raliegh Scarlet on 03/22/2017.  MPulliam, CMA/RT(R)

## 2017-03-03 ENCOUNTER — Encounter: Payer: Self-pay | Admitting: Adult Health

## 2017-03-03 ENCOUNTER — Ambulatory Visit (INDEPENDENT_AMBULATORY_CARE_PROVIDER_SITE_OTHER): Payer: Medicare Other | Admitting: *Deleted

## 2017-03-03 DIAGNOSIS — Z95810 Presence of automatic (implantable) cardiac defibrillator: Secondary | ICD-10-CM | POA: Diagnosis not present

## 2017-03-03 DIAGNOSIS — I5022 Chronic systolic (congestive) heart failure: Secondary | ICD-10-CM

## 2017-03-03 LAB — CUP PACEART INCLINIC DEVICE CHECK
Battery Remaining Longevity: 138 mo
Battery Voltage: 3.16 V
Date Time Interrogation Session: 20180920163737
HighPow Impedance: 59 Ohm
Implantable Lead Implant Date: 20130522
Implantable Lead Location: 753860
Implantable Lead Serial Number: 318396
Implantable Pulse Generator Implant Date: 20180910
Lead Channel Impedance Value: 399 Ohm
Lead Channel Pacing Threshold Amplitude: 1 V
Lead Channel Pacing Threshold Pulse Width: 0.4 ms
Lead Channel Sensing Intrinsic Amplitude: 18.75 mV
Lead Channel Sensing Intrinsic Amplitude: 19.125 mV
Lead Channel Setting Sensing Sensitivity: 0.3 mV
MDC IDC MSMT LEADCHNL RV IMPEDANCE VALUE: 399 Ohm
MDC IDC SET LEADCHNL RV PACING AMPLITUDE: 2.5 V
MDC IDC SET LEADCHNL RV PACING PULSEWIDTH: 0.4 ms
MDC IDC STAT BRADY RV PERCENT PACED: 0 %

## 2017-03-03 LAB — CBC WITH DIFFERENTIAL/PLATELET
BASOS ABS: 0 10*3/uL (ref 0.0–0.2)
Basos: 0 %
EOS (ABSOLUTE): 0.5 10*3/uL — AB (ref 0.0–0.4)
Eos: 4 %
Hematocrit: 37.1 % (ref 34.0–46.6)
Hemoglobin: 12.6 g/dL (ref 11.1–15.9)
Immature Grans (Abs): 0.2 10*3/uL — ABNORMAL HIGH (ref 0.0–0.1)
Immature Granulocytes: 2 %
LYMPHS ABS: 2.5 10*3/uL (ref 0.7–3.1)
LYMPHS: 20 %
MCH: 31.3 pg (ref 26.6–33.0)
MCHC: 34 g/dL (ref 31.5–35.7)
MCV: 92 fL (ref 79–97)
MONOS ABS: 1 10*3/uL — AB (ref 0.1–0.9)
Monocytes: 8 %
NEUTROS ABS: 8.3 10*3/uL — AB (ref 1.4–7.0)
Neutrophils: 66 %
PLATELETS: 170 10*3/uL (ref 150–379)
RBC: 4.02 x10E6/uL (ref 3.77–5.28)
RDW: 14.7 % (ref 12.3–15.4)
WBC: 12.5 10*3/uL — ABNORMAL HIGH (ref 3.4–10.8)

## 2017-03-03 LAB — COMPREHENSIVE METABOLIC PANEL
A/G RATIO: 1.2 (ref 1.2–2.2)
ALK PHOS: 85 IU/L (ref 39–117)
ALT: 15 IU/L (ref 0–32)
AST: 22 IU/L (ref 0–40)
Albumin: 3.8 g/dL (ref 3.5–5.5)
BILIRUBIN TOTAL: 0.8 mg/dL (ref 0.0–1.2)
BUN/Creatinine Ratio: 25 — ABNORMAL HIGH (ref 9–23)
BUN: 16 mg/dL (ref 6–24)
CHLORIDE: 95 mmol/L — AB (ref 96–106)
CO2: 29 mmol/L (ref 20–29)
Calcium: 9 mg/dL (ref 8.7–10.2)
Creatinine, Ser: 0.65 mg/dL (ref 0.57–1.00)
GFR calc non Af Amer: 107 mL/min/{1.73_m2} (ref >59)
GFR, EST AFRICAN AMERICAN: 123 mL/min/{1.73_m2} (ref >59)
GLUCOSE: 94 mg/dL (ref 65–99)
Globulin, Total: 3.3 g/dL (ref 1.5–4.5)
POTASSIUM: 3.9 mmol/L (ref 3.5–5.2)
Sodium: 140 mmol/L (ref 134–144)
TOTAL PROTEIN: 7.1 g/dL (ref 6.0–8.5)

## 2017-03-03 LAB — PHOSPHORUS: PHOSPHORUS: 3.6 mg/dL (ref 2.5–4.5)

## 2017-03-03 LAB — TSH: TSH: 5.64 u[IU]/mL — AB (ref 0.450–4.500)

## 2017-03-03 LAB — LIPID PANEL
CHOLESTEROL TOTAL: 191 mg/dL (ref 100–199)
Chol/HDL Ratio: 2.8 ratio (ref 0.0–4.4)
HDL: 69 mg/dL (ref >39)
LDL Calculated: 100 mg/dL — ABNORMAL HIGH (ref 0–99)
Triglycerides: 108 mg/dL (ref 0–149)
VLDL Cholesterol Cal: 22 mg/dL (ref 5–40)

## 2017-03-03 LAB — VITAMIN B12: VITAMIN B 12: 1227 pg/mL (ref 232–1245)

## 2017-03-03 LAB — T4, FREE: FREE T4: 1.51 ng/dL (ref 0.82–1.77)

## 2017-03-03 LAB — HEMOGLOBIN A1C
ESTIMATED AVERAGE GLUCOSE: 151 mg/dL
HEMOGLOBIN A1C: 6.9 % — AB (ref 4.8–5.6)

## 2017-03-03 LAB — VITAMIN D 25 HYDROXY (VIT D DEFICIENCY, FRACTURES): Vit D, 25-Hydroxy: 15.9 ng/mL — ABNORMAL LOW (ref 30.0–100.0)

## 2017-03-03 LAB — MAGNESIUM: Magnesium: 1.9 mg/dL (ref 1.6–2.3)

## 2017-03-03 NOTE — Progress Notes (Signed)
Wound check appointment. Steri-strips removed from left and right incision sites. Bilateral incision sites without redness or edema. Incision edges approximated, wound well healed. Skin irritation noted below left incision site, pt stated that she had a reaction to the tegarderm. Normal device function. Thresholds, sensing, and impedances consistent with implant measurements. Device programmed at chronic output values, distribution appropriate for patient and level of activity. No ventricular arrhythmias noted. Patient educated about wound care, arm mobility, shock plan. ROV 05/25/2017 w/ SK.

## 2017-03-04 ENCOUNTER — Encounter: Payer: Self-pay | Admitting: Adult Health

## 2017-03-08 ENCOUNTER — Telehealth: Payer: Self-pay | Admitting: Family Medicine

## 2017-03-08 NOTE — Telephone Encounter (Signed)
Patient called stating she had an allergic reaction to tegaderm for the past couple weeks and isn't getting any better. She has tried several over the counter remedies  but nothing has helped and was wanting to speak with someone clinical about what she should do or if Dr. Jenetta Downer can prescribe something to help

## 2017-03-10 NOTE — Telephone Encounter (Signed)
Called left message for patient to call the office. MPulliam, CMA/RT(R)  

## 2017-03-14 ENCOUNTER — Telehealth: Payer: Self-pay

## 2017-03-14 DIAGNOSIS — L231 Allergic contact dermatitis due to adhesives: Secondary | ICD-10-CM | POA: Diagnosis not present

## 2017-03-14 DIAGNOSIS — L282 Other prurigo: Secondary | ICD-10-CM | POA: Diagnosis not present

## 2017-03-14 MED ORDER — VITAMIN D (ERGOCALCIFEROL) 1.25 MG (50000 UNIT) PO CAPS: 50000.0000 [IU] | ORAL_CAPSULE | ORAL | 3 refills | Status: AC

## 2017-03-14 NOTE — Telephone Encounter (Signed)
Pt informed of results and recommendations.  Pt states that she has a follow up appt in December.  RX sent to pharmacy.  Pt expressed understanding and is agreeable.  Charyl Bigger, CMA

## 2017-03-14 NOTE — Telephone Encounter (Signed)
-----   Message from Mellody Dance, DO sent at 03/11/2017  8:89 AM EDT ----- Certain lab abnormalities have been stable for the past month or so.  I.e. WBC.  So that good.   Her A1c is up and in the diabetic range.  6.9.  She needs to work on intensive dietary and lifestyle modifications including weight loss and this should be rechecked in exactly 3 months.  If it is above 6.5, then she will need to go on diabetes medicines. Her TSH is up but her T4 is normal.  With treatment and stress she is undergoing right now, no need for change in treatment plan, recheck 6 months. She needs to be takuig more vitamin D supplementation.  Per the chart she is taking 5000 IUs daily.  All her to continue that.  Send in prescription for 50,000 IUs weekly vitamin D No. 12, 10 refills.  Recheck vitamin D 3-6 months

## 2017-03-15 ENCOUNTER — Other Ambulatory Visit: Payer: Self-pay | Admitting: Internal Medicine

## 2017-03-15 DIAGNOSIS — K219 Gastro-esophageal reflux disease without esophagitis: Secondary | ICD-10-CM

## 2017-03-18 ENCOUNTER — Encounter (HOSPITAL_COMMUNITY): Payer: Self-pay | Admitting: Internal Medicine

## 2017-03-21 ENCOUNTER — Encounter: Payer: Self-pay | Admitting: Radiation Oncology

## 2017-03-22 ENCOUNTER — Ambulatory Visit (INDEPENDENT_AMBULATORY_CARE_PROVIDER_SITE_OTHER): Payer: Medicare Other | Admitting: Family Medicine

## 2017-03-22 ENCOUNTER — Encounter: Payer: Self-pay | Admitting: Family Medicine

## 2017-03-22 VITALS — BP 97/64 | HR 84 | Ht 68.5 in | Wt 265.0 lb

## 2017-03-22 DIAGNOSIS — F4323 Adjustment disorder with mixed anxiety and depressed mood: Secondary | ICD-10-CM

## 2017-03-22 DIAGNOSIS — E785 Hyperlipidemia, unspecified: Secondary | ICD-10-CM | POA: Diagnosis not present

## 2017-03-22 DIAGNOSIS — E559 Vitamin D deficiency, unspecified: Secondary | ICD-10-CM | POA: Diagnosis not present

## 2017-03-22 DIAGNOSIS — E1169 Type 2 diabetes mellitus with other specified complication: Secondary | ICD-10-CM

## 2017-03-22 DIAGNOSIS — E1159 Type 2 diabetes mellitus with other circulatory complications: Secondary | ICD-10-CM

## 2017-03-22 DIAGNOSIS — R5381 Other malaise: Secondary | ICD-10-CM

## 2017-03-22 DIAGNOSIS — E118 Type 2 diabetes mellitus with unspecified complications: Secondary | ICD-10-CM | POA: Diagnosis not present

## 2017-03-22 DIAGNOSIS — C50912 Malignant neoplasm of unspecified site of left female breast: Secondary | ICD-10-CM | POA: Diagnosis not present

## 2017-03-22 DIAGNOSIS — I10 Essential (primary) hypertension: Secondary | ICD-10-CM | POA: Diagnosis not present

## 2017-03-22 DIAGNOSIS — Z23 Encounter for immunization: Secondary | ICD-10-CM | POA: Diagnosis not present

## 2017-03-22 DIAGNOSIS — M797 Fibromyalgia: Secondary | ICD-10-CM

## 2017-03-22 DIAGNOSIS — E662 Morbid (severe) obesity with alveolar hypoventilation: Secondary | ICD-10-CM | POA: Diagnosis not present

## 2017-03-22 MED ORDER — FREESTYLE UNISTICK II LANCETS MISC: 12 refills | Status: AC

## 2017-03-22 MED ORDER — FREESTYLE FREEDOM LITE W/DEVICE KIT: PACK | 1 refills | Status: AC

## 2017-03-22 MED ORDER — GLUCOSE BLOOD VI STRP: ORAL_STRIP | 12 refills | Status: AC

## 2017-03-22 NOTE — Progress Notes (Signed)
Assessment and plan:  1. Type 2 diabetes mellitus with complication, without long-term current use of insulin ([name])   2. Adjustment disorder with mixed anxiety and depressed mood   3. Hyperlipidemia associated with type 2 diabetes mellitus ([name])   4. Hypertension associated with diabetes ([name])   5. Fibromyalgia syndrome/ chronic pain syndrome   6. Cardiopulmonary obesity syndrome (HCC)   7. Physical deconditioning   8. Vitamin D deficiency   9. Malignant neoplasm of left female breast, unspecified estrogen receptor status, unspecified site of breast ([name])   10. Flu vaccine need    - This is a new onset of type 2 diabetes for patient.  She declines medication and wishes to work on it with diet and lifestyle modifications over the next 2 and half months.  --->    She understands it is not under better control of less than 6.5 when checked next, she will need medications.    We had an extremely long discussion talking about diabetes mellitus standard of care including yrly eye exam, foot exam, A1c goals and fasting sugar goals as well as 2 hour postprandial blood sugar goals.  We had a discussion about how and when she should check her blood sugars.   New prescription for glucose meter kit sent in to pharmacy.  Patient would like to come back in 4 weeks and we will discuss her blood sugar levels and Logs then.   - Also in 4 weeks we will go over her food intake log using the apple lose it or my fitness pal or the like.  We had a long discussion about ways to help her lose weight and what she can do to cut back on carbohydrates etc.  We discussed going to nutritionist the patient wishes to follow with me and get counseling from [place].  She will continue to eat She normally does for the next 2-4 weeks and then follow up so we can review her dietary habits then we will focus on decreasing carbohydrate and other strategies to help with  her diabetes.  Ways also discussed Weight Watchers and other dietary approaches such as keto diet and low carbohydrate options.  Patient not ready to follow "a specific diet ".  - Depressed mood likely due to very poor diet choices and high sugar foods like recently.  We discussed ways for patient to improve her mood such as proper sleep, eating healthier food choices, meditation and/or prayer, and walking daily.    - Due to patient's fibromyalgia which she has   widespread chronic pain syndrome, her mobility is limited.  We discussed options such as swimming and other non-weightbearing exercises which would be more forgiving on her joints such as elliptical, biking etc.  - With patient's vitamin D deficiency, she will take a weekly and daily dose\supplement.  We will recheck this in about 3 months.  Pt was in the office today for 40+ minutes, with over 50% time spent in face to face counseling of patients various medical conditions, treatment plans of those medical conditions including medicine management and lifestyle modification, strategies to improve health and well being; and in coordination of care. SEE ABOVE FOR DETAILS  Meds ordered this encounter  Medications  . Blood Glucose Monitoring Suppl (FREESTYLE FREEDOM LITE) w/Device KIT    Sig: Check fasting and 2 hour PP    Dispense:  1 each    Refill:  1  . FREESTYLE UNISTICK II LANCETS MISC  Sig: Check fasting blood sugar in the morning and check sugar after largest meal of the day.    Dispense:  100 each    Refill:  12  . glucose blood (FREESTYLE TEST STRIPS) test strip    Sig: To be filled with Freedom Lite test strips.   Check fasting blood sugar in the morning and check sugar after largest meal of the day.    Dispense:  100 each    Refill:  12    Orders Placed This Encounter  Procedures  . Flu Vaccine QUAD 6+ mos PF IM (Fluarix Quad PF)     Return in about 4 weeks (around 04/19/2017) for Go over blood sugars and lose it  app.  Anticipatory guidance and routine counseling done re: condition, txmnt options and need for follow up. All questions of patient's were answered.   Gross side effects, risk and benefits, and alternatives of medications discussed with patient.  Patient is aware that all medications have potential side effects and we are unable to predict every sideeffect or drug-drug interaction that may occur.  Expresses verbal understanding and consents to current therapy plan and treatment regiment.  Please see AVS handed out to patient at the end of our visit for additional patient instructions/ counseling done pertaining to today's office visit.  Note: This document was prepared using Dragon voice recognition software and may include unintentional dictation errors.   ----------------------------------------------------------------------------------------------------------------------  Subjective:   CC:   [name] is a 46 y.o. female who presents to [place] at [facility] today for review and discussion of recent bloodwork that was done.  1. All recent blood work that we ordered was reviewed with patient today.  Patient was counseled on all abnormalities and we discussed dietary and lifestyle changes that could help those values (also medications when appropriate).  Extensive health counseling performed and all patient's concerns/ questions were addressed.  2. Prediabetes, now converted to a full blown diabetic: Patient's A1c is now over 6.5 and is currently at 6.9.  One month ago it was 6.1.  Patient admits to going to cheesecakes by [employer] almost on a daily basis and eating almost a half of the cheesecake multiple times per week.  She says not just one piece at a time.  She recalls in the past she was diagnosed possibly with diabetes with an A1c over 6.5 but I do not see that in her labs after review of the chart and she cannot confirm this.  She does not check her sugars.  She has  had polyuria and esp polydipsia for the past 2 weeks. 3.  Patient also complains of chronic aches and pains and that her fibromyalgia has recently flared.  4. She also states emotionally she is been feeling completely unmotivated and has had a depressed mood.  She denies any suicidal homicidal ideations but says she is just really been depressed and unmotivated.  She feels overwhelmed with her pain and multiple medical problems.     Wt Readings from Last 3 Encounters:  03/22/17 265 lb (120.2 kg)  02/21/17 257 lb (116.6 kg)  02/15/17 261 lb 12.8 oz (118.8 kg)   BP Readings from Last 3 Encounters:  03/22/17 97/64  02/21/17 137/77  02/15/17 (!) 103/58   Pulse Readings from Last 3 Encounters:  03/22/17 84  02/21/17 87  02/15/17 76   BMI Readings from Last 3 Encounters:  03/22/17 39.71 kg/m  02/21/17 40.25 kg/m  02/15/17 39.23 kg/m  Patient Care Team    Relationship Specialty Notifications Start End  Mellody Dance, DO PCP - General Family Medicine  01/21/17   Bensimhon, Shaune Pascal, MD Consulting Physician Cardiology  07/08/14   Magrinat, Virgie Dad, MD Consulting Physician Oncology  07/08/14   Deboraha Sprang, MD Consulting Physician Cardiology  07/08/14   Dohmeier, Asencion Partridge, MD Consulting Physician Neurology  07/08/14   Chesley Mires, MD Consulting Physician Pulmonary Disease  07/08/14   Fanny Skates, MD Consulting Physician General Surgery  12/28/16   Eppie Gibson, MD Attending Physician Radiation Oncology  12/28/16   Ena Dawley, MD Consulting Physician Obstetrics and Gynecology  02/07/17   Latanya Maudlin, MD Consulting Physician Orthopedic Surgery  02/07/17   Domingo Pulse, MD Consulting Physician Urology  02/07/17    Comment: Interstitial cystitis    Full medical history updated and reviewed in the office today  Patient Active Problem List   Diagnosis Date Noted  . Glucose intolerance (impaired glucose tolerance) 02/07/2017    Priority: High  . Malignant neoplasm  of upper-inner quadrant of left breast in female, estrogen receptor positive ([place]) 12/22/2016    Priority: High  . Vitamin D deficiency 08/19/2011    Priority: High  . Diabetes mellitus, type II ([place]) 03/24/2011    Priority: High  . Hypertension associated with diabetes ([place]) 04/29/2008    Priority: High  . Malignant neoplasm of left female breast ([place]) 02/07/2017    Priority: Medium  . Convulsions/seizures ([place]) 12/25/2014    Priority: Medium  . Acute on chronic systolic CHF (congestive heart failure) ([place]) 11/27/2014    Priority: Medium  . Physical deconditioning 06/24/2014    Priority: Medium  . Breast cancer, right breast ([place]) 05/29/2014    Priority: Medium  . Fibromyalgia affecting multiple sites 05/29/2014    Priority: Medium  . Systolic heart failure ([place]) 08/25/2008    Priority: Medium  . Cardiopulmonary obesity syndrome ([place]) 04/29/2008    Priority: Medium  . Family history of ovarian cancer     Priority: Low  . Family history of colon cancer     Priority: Low  . Generalized anxiety disorder 07/08/2014    Priority: Low  . GERD (gastroesophageal reflux disease) 12/09/2010    Priority: Low  . Hyperlipidemia associated with type 2 diabetes mellitus ([place]) 03/22/2017  . Adjustment disorder with mixed anxiety and depressed mood 03/22/2017  . Left breast mass- path c/w CA- Dr Dalbert Batman 02/07/2017  . Fibromyalgia syndrome 02/07/2017  . S/P placement of cardiac pacemaker 02/07/2017  . Genetic testing 02/03/2017  . Malignant neoplasm of overlapping sites of right breast in female, estrogen receptor positive ([place]) 12/28/2016  . History of malignant neoplasm of breast 09/01/2015  . Conversion disorder with abnormal movement 12/25/2014  . Myoclonus 12/20/2014  . Acute renal insufficiency 11/28/2014  . ICD in place (MDT 2013) 11/27/2014  . Near syncope 11/26/2014  . Stomach discomfort 10/14/2014  . Chronic pain not due to malignancy 05/29/2014  . Dyspnea 01/07/2014  . Chronic  sinusitis 01/07/2014  . Conversion disorder 09/03/2013  . 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 Uncontrolled persistent asthma 11/15/2012  . Orthostatic hypotension 05/18/2012  . Keloid 03/14/2012  . Single implantable cardioverter-defibrillator in situ 11/03/2011  . Weakness 07/15/2011  . Chest pain 12/09/2010  . Secondary cardiomyopathy s/p chemo therapy 04/01/2009  . FATIGUE / MALAISE 10/02/2008  . DIFFICULTY IN [facility] 04/29/2008  . DIZZINESS 04/29/2008  . NUMBNESS 04/29/2008  .  EDEMA 04/29/2008  . ABNORMAL WEIGHT GAIN 04/29/2008  . PALPITATIONS 04/29/2008  . ORTHOPNEA 04/29/2008  . DYSPNEA 04/29/2008  . COUGH 04/29/2008    Past Medical History:  Diagnosis Date  . Acne   . AICD (automatic cardioverter/defibrillator) present   . Anemia 1980's   [id number]  . Anginal pain ([place])    sporatic , been going on for yrs,  . Anxiety   . Arthritis   . 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 Blood transfusion 1980's   1987 or 1988  . Breast calcifications    right breast  . Breast cancer (HCC)    right;  . CHF (congestive heart failure) ([place])    due to non-ischemic cardiomyopathy, thought to be chemotherapy induced;  cath 7/12: normal cors, EF 20-25%. Cardiac MRI 05/2011 EF 32%. ICD implantation 10/2011 (Medtronic)  . 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 Conversion disorder   . 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 Depression   . Endometriosis   . Family history of colon cancer   . Family history of ovarian cancer   . Fibromyalgia   . GERD (gastroesophageal reflux disease)   . Headache   . Hepatomegaly   . History of stomach ulcers   . Hyperlipidemia   . Hypertension    c/b orthostatic hypotention  . ICD (implantable cardiac defibrillator) in place   . Insomnia   . Interstitial cystitis   . Left eye injury 12/2014  . 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   . Nonischemic cardiomyopathy ([place])    related to chemo; EF 30% 05/2011  . Obesity   . Palpitation    normal sinus rhythm only on 21 day heart monitor  . Panic attacks   . Peripheral neuropathy    chemo- induced  . Pneumonia 2000's   "once"  . Seasonal allergies   . Seizures ([place])    last seizure  last week: non elipsey seisure  . Type II diabetes mellitus ([place])     Past Surgical History:  Procedure Laterality Date  . ABDOMINAL HYSTERECTOMY     partial  . BREAST EXCISIONAL BIOPSY     right benign 2012  . BREAST LUMPECTOMY  2008; 2013   right radiation  . BREAST LUMPECTOMY WITH RADIOACTIVE SEED AND SENTINEL LYMPH NODE BIOPSY Left 02/21/2017   Procedure: LEFT BREAST LUMPECTOMY WITH RADIOACTIVE SEED AND LEFT AXILLARY DEEP SENTINEL LYMPH NODE BIOPSY;  Surgeon: Fanny Skates, MD;  Location: [facility];  Service: General;  Laterality: Left;  . CARDIAC DEFIBRILLATOR PLACEMENT  11/03/11  . CESAREAN SECTION    . CHOLECYSTECTOMY  ~ 2009  . COLONOSCOPY    . ICD REVISION N/A 02/21/2017   Procedure: ICD Revision;  Surgeon: Deboraha Sprang, MD;  Location: [facility];  Service: Cardiovascular;  Laterality: N/A;  . IMPLANTABLE CARDIOVERTER DEFIBRILLATOR IMPLANT N/A 11/03/2011   Procedure: IMPLANTABLE CARDIOVERTER DEFIBRILLATOR IMPLANT;  Surgeon: Deboraha Sprang, MD; MDT   . LAPAROSCOPIC ENDOMETRIOSIS FULGURATION  1990's  . NASAL SINUS SURGERY    . [place] REMOVAL  2010?   left chest; placed in 2008  . TONSILLECTOMY  1980's  . TUBAL LIGATION  1990's    Social History  Substance Use Topics  . Smoking status: Never Smoker  . Smokeless tobacco: Never Used  . Alcohol use Yes     Comment: social drinker     Family Hx: Family History  Problem Relation Age of Onset  . Heart disease  Mother   . Arthritis Mother   . Hypertension Mother   . Stroke Mother   . Fibromyalgia Mother   . Coronary artery disease Unknown   . Heart attack  Unknown   . Heart disease Maternal Uncle   . Colon cancer Paternal Uncle        dx over 51  . Heart disease Maternal Grandmother   . Heart attack Maternal Grandmother   . Ovarian cancer Paternal Grandmother   . Fibromyalgia Sister   . Ovarian cancer Paternal Aunt      Medications: Current Outpatient Prescriptions  Medication Sig Dispense Refill  . acetaminophen (TYLENOL) 500 MG tablet Take 1,000 mg by mouth 2 (two) times daily as needed for moderate pain.    Marland Kitchen albuterol (PROVENTIL HFA;VENTOLIN HFA) 108 (90 Base) MCG/ACT inhaler Inhale 1-2 puffs into the lungs every 6 (six) hours as needed for wheezing or shortness of breath.     . baclofen (LIORESAL) 10 MG tablet TAKE 1 TABLET BY MOUTH 2 (TWO) TIMES DAILY AS NEEDED FOR MUSCLE SPASMS. 60 tablet 0  . BLACK CURRANT SEED OIL PO Take 15 mLs by mouth daily.     . carvedilol (COREG) 25 MG tablet Take 0.5 tablets (12.5 mg total) by mouth 2 (two) times daily with a meal. 60 tablet 6  . Cholecalciferol (VITAMIN D3) 5000 UNITS CAPS Take 5,000 Units by mouth daily.     . Coenzyme Q10 (CO Q-10) 100 MG CAPS Take 100 mg by mouth daily.    . Cyanocobalamin 2500 MCG SUBL Place 2,500 mcg under the tongue daily.    . cyclobenzaprine (FLEXERIL) 10 MG tablet Take 10 mg by mouth 3 (three) times daily as needed for muscle spasms.    . digoxin (LANOXIN) 0.125 MG tablet TAKE 0.5 TABLETS (0.0625 MG TOTAL) BY MOUTH DAILY. 15 tablet 2  . divalproex (DEPAKOTE ER) 500 MG 24 hr tablet Take 1 tablet in a.m. and 2 tablets in p.m. 90 tablet 5  . DULoxetine (CYMBALTA) 30 MG capsule Take 1 capsule (30 mg total) by mouth 2 (two) times daily. 60 capsule 5  . ferrous sulfate 325 (65 FE) MG tablet Take 325 mg by mouth daily with breakfast.    . GLUCOSAMINE-CHONDROITIN PO Take 1,200 mg by mouth daily.    Marland Kitchen HYDROcodone-acetaminophen (NORCO) 5-325 MG tablet Take 1-2 tablets by mouth every 6 (six) hours as needed for moderate pain or severe pain. 30 tablet 0  . hydrOXYzine  (ATARAX/VISTARIL) 25 MG tablet Take 25 mg by mouth at bedtime as needed for itching (DOESN'T TAKE WITH TRAZODONE).    Marland Kitchen ivabradine (CORLANOR) 5 MG TABS tablet Take 1 tablet (5 mg total) by mouth 2 (two) times daily with a meal. 180 tablet 3  . letrozole (FEMARA) 2.5 MG tablet Take 1 tablet (2.5 mg total) by mouth daily. 90 tablet 4  . loratadine (CLARITIN) 10 MG tablet Take 10 mg by mouth daily.    Marland Kitchen losartan (COZAAR) 25 MG tablet Take 1 tablet (25 mg total) by mouth daily. (Patient taking differently: Take 25 mg by mouth at bedtime. ) 90 tablet 3  . Menthol, Topical Analgesic, (ICY HOT EX) Apply 1 application topically daily as needed (muscle pain).    Marland Kitchen metolazone (ZAROXOLYN) 2.5 MG tablet Take 1 tablet (2.5 mg total) by mouth daily as needed (edema). 15 tablet 0  . Milk Thistle 150 MG CAPS Take 450 mg by mouth daily.     . Multiple Vitamin (MULTIVITAMIN WITH MINERALS) TABS Take  1 tablet by mouth daily. Reported on 10/13/2015    . omeprazole (PRILOSEC) 40 MG capsule TAKE 1 CAPSULE (40 MG TOTAL) BY MOUTH 2 (TWO) TIMES DAILY BEFORE A MEAL. BREAKFAST AND SUPPER 60 capsule 9  . ondansetron (ZOFRAN) 4 MG tablet Take 1 tablet (4 mg total) by mouth every 8 (eight) hours as needed for nausea or vomiting. 20 tablet 3  . PATADAY 0.2 % SOLN Place 1 drop into both eyes daily as needed (allergies).     . pentosan polysulfate (ELMIRON) 100 MG capsule Take 100 mg by mouth 2 (two) times daily as needed (bladder spasms).     . potassium chloride SA (K-DUR,KLOR-CON) 20 MEQ tablet Take 2 tablets (40 mEq total) by mouth daily. 180 tablet 3  . Sodium Chloride-Sodium Bicarb (NETI POT SINUS WASH NA) Place 1 Dose into the nose daily as needed (congestion).    Marland Kitchen spironolactone (ALDACTONE) 25 MG tablet TAKE 0.5 TABLETS (12.5 MG TOTAL) BY MOUTH DAILY. 45 tablet 3  . torsemide (DEMADEX) 20 MG tablet TAKE 2 TABLETS (40 MG TOTAL) BY MOUTH DAILY. 60 tablet 6  . traZODone (DESYREL) 50 MG tablet Take 50 mg by mouth at bedtime as  needed for sleep.     Marland Kitchen triamcinolone cream (KENALOG) 0.1 % Apply 1 application topically 2 (two) times daily as needed (eczema).    . Vitamin D, Ergocalciferol, (DRISDOL) 50000 units CAPS capsule Take 1 capsule (50,000 Units total) by mouth every 7 (seven) days. 12 capsule 3  . Blood Glucose Monitoring Suppl (FREESTYLE FREEDOM LITE) w/Device KIT Check fasting and 2 hour PP 1 each 1  . FREESTYLE UNISTICK II LANCETS MISC Check fasting blood sugar in the morning and check sugar after largest meal of the day. 100 each 12  . glucose blood (FREESTYLE TEST STRIPS) test strip To be filled with Freedom Lite test strips.   Check fasting blood sugar in the morning and check sugar after largest meal of the day. 100 each 12   No current facility-administered medications for this visit.     Allergies:  Allergies  Allergen Reactions  . Ace Inhibitors Other (See Comments)    hyperkalemia  . Reglan [Metoclopramide Hcl] Anxiety  . Adhesive [Tape] Itching and Rash    Specifically tegaderm, normal adhesive tape is ok     Review of Systems: General:   No F/C, wt loss Pulm:   No DIB, SOB, pleuritic chest pain Card:  No CP, palpitations Abd:  No n/v/d or pain Ext:  No inc edema from baseline  Objective:  Blood pressure 97/64, pulse 84, height 5' 8.5" (1.74 m), weight 265 lb (120.2 kg). Body mass index is 39.71 kg/m. Gen:   Well NAD, A and O *3 HEENT:    [place]/AT, EOMI,  MMM Lungs:   Normal work of breathing. CTA B/L, no Wh, rhonchi Heart:   RRR, S1, S2 WNL's, no MRG Abd:   No gross distention Exts:    warm, pink,  Brisk capillary refill, warm and well perfused.  Psych:    No HI/SI, judgement and insight good, Euthymic mood. Full Affect.   Recent Results (from the past 2160 hour(s))  CBC with Differential/Platelet     Status: Abnormal   Collection Time: 01/04/17 12:07 PM  Result Value Ref Range   WBC 4.1 3.9 - 10.3 10e3/uL   NEUT# 2.4 1.5 - 6.5 10e3/uL   HGB 12.8 11.6 - 15.9 g/dL   HCT 39.3  34.8 - 46.6 %   Platelets 120 (  L) 145 - 400 10e3/uL   MCV 96.7 79.5 - 101.0 fL   MCH 31.6 25.1 - 34.0 pg   MCHC 32.7 31.5 - 36.0 g/dL   RBC 4.07 3.70 - 5.45 10e6/uL   RDW 15.7 (H) 11.2 - 14.5 %   lymph# 1.0 0.9 - 3.3 10e3/uL   MONO# 0.6 0.1 - 0.9 10e3/uL   Eosinophils Absolute 0.0 0.0 - 0.5 10e3/uL   Basophils Absolute 0.1 0.0 - 0.1 10e3/uL   NEUT% 58.2 38.4 - 76.8 %   LYMPH% 25.1 14.0 - 49.7 %   MONO% 14.4 (H) 0.0 - 14.0 %   EOS% 1.0 0.0 - 7.0 %   BASO% 1.3 0.0 - 2.0 %  Comprehensive metabolic panel     Status: Abnormal   Collection Time: 01/04/17 12:07 PM  Result Value Ref Range   Sodium 138 136 - 145 mEq/L   Potassium 4.0 3.5 - 5.1 mEq/L   Chloride 96 (L) 98 - 109 mEq/L   CO2 30 (H) 22 - 29 mEq/L   Glucose 84 70 - 140 mg/dl    Comment: Glucose reference range is for nonfasting patients. Fasting glucose reference range is 70- 100.   BUN 21.4 7.0 - 26.0 mg/dL   Creatinine 1.4 (H) 0.6 - 1.1 mg/dL   Total Bilirubin 1.70 (H) 0.20 - 1.20 mg/dL   Alkaline Phosphatase 106 40 - 150 U/L   AST 18 5 - 34 U/L   ALT 9 0 - 55 U/L   Total Protein 7.9 6.4 - 8.3 g/dL   Albumin 3.6 3.5 - 5.0 g/dL   Calcium 9.4 8.4 - 10.4 mg/dL   Anion Gap 12 (H) 3 - 11 mEq/L   EGFR 51 (L) >90 ml/min/1.73 m2    Comment: eGFR is calculated using the CKD-EPI Creatinine Equation (2009)  CBC with Differential/Platelet     Status: Abnormal   Collection Time: 01/13/17 10:27 AM  Result Value Ref Range   WBC 5.1 3.9 - 10.3 10e3/uL   NEUT# 2.9 1.5 - 6.5 10e3/uL   HGB 12.9 11.6 - 15.9 g/dL   HCT 39.7 34.8 - 46.6 %   Platelets 108 (L) 145 - 400 10e3/uL   MCV 97.5 79.5 - 101.0 fL   MCH 31.7 25.1 - 34.0 pg   MCHC 32.5 31.5 - 36.0 g/dL   RBC 4.07 3.70 - 5.45 10e6/uL   RDW 15.9 (H) 11.2 - 14.5 %   lymph# 1.4 0.9 - 3.3 10e3/uL   MONO# 0.8 0.1 - 0.9 10e3/uL   Eosinophils Absolute 0.0 0.0 - 0.5 10e3/uL   Basophils Absolute 0.0 0.0 - 0.1 10e3/uL   NEUT% 55.7 38.4 - 76.8 %   LYMPH% 26.7 14.0 - 49.7 %   MONO% 16.0  (H) 0.0 - 14.0 %   EOS% 0.8 0.0 - 7.0 %   BASO% 0.8 0.0 - 2.0 %  Comprehensive metabolic panel     Status: Abnormal   Collection Time: 01/13/17 10:27 AM  Result Value Ref Range   Sodium 137 136 - 145 mEq/L   Potassium 4.1 3.5 - 5.1 mEq/L   Chloride 99 98 - 109 mEq/L   CO2 29 22 - 29 mEq/L   Glucose 87 70 - 140 mg/dl    Comment: Glucose reference range is for nonfasting patients. Fasting glucose reference range is 70- 100.   BUN 21.0 7.0 - 26.0 mg/dL   Creatinine 1.3 (H) 0.6 - 1.1 mg/dL   Total Bilirubin 1.84 (H) 0.20 - 1.20 mg/dL  Alkaline Phosphatase 93 40 - 150 U/L   AST 18 5 - 34 U/L   ALT 7 0 - 55 U/L   Total Protein 7.7 6.4 - 8.3 g/dL   Albumin 3.5 3.5 - 5.0 g/dL   Calcium 8.9 8.4 - 10.4 mg/dL   Anion Gap 10 3 - 11 mEq/L   EGFR 59 (L) >90 ml/min/1.73 m2    Comment: eGFR is calculated using the CKD-EPI Creatinine Equation (2009)  CBC with Differential/Platelet     Status: Abnormal   Collection Time: 02/01/17  2:35 PM  Result Value Ref Range   WBC 7.1 3.9 - 10.3 10e3/uL   NEUT# 3.9 1.5 - 6.5 10e3/uL   HGB 16.0 (H) 11.6 - 15.9 g/dL   HCT 48.9 (H) 34.8 - 46.6 %   Platelets 152 145 - 400 10e3/uL   MCV 96.1 79.5 - 101.0 fL   MCH 31.5 25.1 - 34.0 pg   MCHC 32.8 31.5 - 36.0 g/dL   RBC 5.09 3.70 - 5.45 10e6/uL   RDW 15.6 (H) 11.2 - 14.5 %   lymph# 1.9 0.9 - 3.3 10e3/uL   MONO# 1.0 (H) 0.1 - 0.9 10e3/uL   Eosinophils Absolute 0.1 0.0 - 0.5 10e3/uL   Basophils Absolute 0.1 0.0 - 0.1 10e3/uL   NEUT% 55.8 38.4 - 76.8 %   LYMPH% 27.5 14.0 - 49.7 %   MONO% 13.7 0.0 - 14.0 %   EOS% 1.7 0.0 - 7.0 %   BASO% 1.3 0.0 - 2.0 %  Comprehensive metabolic panel     Status: Abnormal   Collection Time: 02/01/17  2:35 PM  Result Value Ref Range   Sodium 134 (L) 136 - 145 mEq/L   Potassium 4.0 3.5 - 5.1 mEq/L   Chloride 94 (L) 98 - 109 mEq/L   CO2 31 (H) 22 - 29 mEq/L   Glucose 89 70 - 140 mg/dl    Comment: Glucose reference range is for nonfasting patients. Fasting glucose reference  range is 70- 100.   BUN 23.2 7.0 - 26.0 mg/dL   Creatinine 1.3 (H) 0.6 - 1.1 mg/dL   Total Bilirubin 2.16 (H) 0.20 - 1.20 mg/dL   Alkaline Phosphatase 111 40 - 150 U/L   AST 20 5 - 34 U/L   ALT 9 0 - 55 U/L   Total Protein 9.0 (H) 6.4 - 8.3 g/dL   Albumin 3.6 3.5 - 5.0 g/dL   Calcium 10.4 8.4 - 10.4 mg/dL   Anion Gap 10 3 - 11 mEq/L   EGFR 58 (L) >90 ml/min/1.73 m2    Comment: eGFR is calculated using the CKD-EPI Creatinine Equation (2009)  Glucose, capillary     Status: Abnormal   Collection Time: 02/15/17 12:12 PM  Result Value Ref Range   Glucose-Capillary 131 (H) 65 - 99 mg/dL  CBC WITH DIFFERENTIAL     Status: Abnormal   Collection Time: 02/15/17 12:57 PM  Result Value Ref Range   WBC 12.6 (H) 4.0 - 10.5 K/uL   RBC 4.74 3.87 - 5.11 MIL/uL   Hemoglobin 14.5 12.0 - 15.0 g/dL   HCT 44.7 36.0 - 46.0 %   MCV 94.3 78.0 - 100.0 fL   MCH 30.6 26.0 - 34.0 pg   MCHC 32.4 30.0 - 36.0 g/dL   RDW 14.3 11.5 - 15.5 %   Platelets 186 150 - 400 K/uL   Neutrophils Relative % 64 %   Neutro Abs 8.0 (H) 1.7 - 7.7 K/uL   Lymphocytes Relative 28 %  Lymphs Abs 3.5 0.7 - 4.0 K/uL   Monocytes Relative 7 %   Monocytes Absolute 0.9 0.1 - 1.0 K/uL   Eosinophils Relative 1 %   Eosinophils Absolute 0.1 0.0 - 0.7 K/uL   Basophils Relative 0 %   Basophils Absolute 0.1 0.0 - 0.1 K/uL  Comprehensive metabolic panel     Status: Abnormal   Collection Time: 02/15/17 12:57 PM  Result Value Ref Range   Sodium 136 135 - 145 mmol/L   Potassium 4.1 3.5 - 5.1 mmol/L   Chloride 100 (L) 101 - 111 mmol/L   CO2 27 22 - 32 mmol/L   Glucose, Bld 95 65 - 99 mg/dL   BUN 17 6 - 20 mg/dL   Creatinine, Ser 1.01 (H) 0.44 - 1.00 mg/dL   Calcium 8.8 (L) 8.9 - 10.3 mg/dL   Total Protein 7.7 6.5 - 8.1 g/dL   Albumin 3.5 3.5 - 5.0 g/dL   AST 19 15 - 41 U/L   ALT 12 (L) 14 - 54 U/L   Alkaline Phosphatase 73 38 - 126 U/L   Total Bilirubin 1.2 0.3 - 1.2 mg/dL   GFR calc non Af Amer >60 >60 mL/min   GFR calc Af Amer  >60 >60 mL/min    Comment: (NOTE) The eGFR has been calculated using the CKD EPI equation. This calculation has not been validated in all clinical situations. eGFR's persistently <60 mL/min signify possible Chronic Kidney Disease.    Anion gap 9 5 - 15  Hemoglobin A1c     Status: Abnormal   Collection Time: 02/15/17 12:58 PM  Result Value Ref Range   Hgb A1c MFr Bld 6.1 (H) 4.8 - 5.6 %    Comment: (NOTE) Pre diabetes:          5.7%-6.4% Diabetes:              >6.4% Glycemic control for   <7.0% adults with diabetes    Mean Plasma Glucose 128.37 mg/dL  Surgical pcr screen     Status: Abnormal   Collection Time: 02/21/17  8:03 AM  Result Value Ref Range   MRSA, PCR NEGATIVE NEGATIVE   Staphylococcus aureus POSITIVE (A) NEGATIVE    Comment: (NOTE) The Xpert SA Assay (FDA approved for NASAL specimens in patients 39 years of age and older), is one component of a comprehensive surveillance program. It is not intended to diagnose infection nor to guide or monitor treatment.   Glucose, capillary     Status: Abnormal   Collection Time: 02/21/17 11:18 AM  Result Value Ref Range   Glucose-Capillary 104 (H) 65 - 99 mg/dL   Comment 1 Notify RN    Comment 2 Document in Chart   Glucose, capillary     Status: Abnormal   Collection Time: 02/21/17  1:39 PM  Result Value Ref Range   Glucose-Capillary 133 (H) 65 - 99 mg/dL   Comment 1 Notify RN   CBC with Differential/Platelet     Status: Abnormal   Collection Time: 03/02/17 11:19 AM  Result Value Ref Range   WBC 12.5 (H) 3.4 - 10.8 x10E3/uL   RBC 4.02 3.77 - 5.28 x10E6/uL   Hemoglobin 12.6 11.1 - 15.9 g/dL   Hematocrit 37.1 34.0 - 46.6 %   MCV 92 79 - 97 fL   MCH 31.3 26.6 - 33.0 pg   MCHC 34.0 31.5 - 35.7 g/dL   RDW 14.7 12.3 - 15.4 %   Platelets 170 150 - 379 x10E3/uL  Neutrophils 66 Not Estab. %   Lymphs 20 Not Estab. %   Monocytes 8 Not Estab. %   Eos 4 Not Estab. %   Basos 0 Not Estab. %   Neutrophils Absolute 8.3 (H) 1.4  - 7.0 x10E3/uL   Lymphocytes Absolute 2.5 0.7 - 3.1 x10E3/uL   Monocytes Absolute 1.0 (H) 0.1 - 0.9 x10E3/uL   EOS (ABSOLUTE) 0.5 (H) 0.0 - 0.4 x10E3/uL   Basophils Absolute 0.0 0.0 - 0.2 x10E3/uL   Immature Granulocytes 2 Not Estab. %   Immature Grans (Abs) 0.2 (H) 0.0 - 0.1 x10E3/uL    Comment: (An elevated percentage of Immature Granulocytes has not been found to be clinically significant as a sole clinical predictor of disease. Does NOT include bands or blast cells.  Pregnancy associated physiological leukocytosis may also show increased immature granulocytes without clinical significance.)   Comprehensive metabolic panel     Status: Abnormal   Collection Time: 03/02/17 11:19 AM  Result Value Ref Range   Glucose 94 65 - 99 mg/dL   BUN 16 6 - 24 mg/dL   Creatinine, Ser 0.65 0.57 - 1.00 mg/dL   GFR calc non Af Amer 107 >59 mL/min/1.73   GFR calc Af Amer 123 >59 mL/min/1.73   BUN/Creatinine Ratio 25 (H) 9 - 23   Sodium 140 134 - 144 mmol/L   Potassium 3.9 3.5 - 5.2 mmol/L   Chloride 95 (L) 96 - 106 mmol/L   CO2 29 20 - 29 mmol/L   Calcium 9.0 8.7 - 10.2 mg/dL   Total Protein 7.1 6.0 - 8.5 g/dL   Albumin 3.8 3.5 - 5.5 g/dL   Globulin, Total 3.3 1.5 - 4.5 g/dL   Albumin/Globulin Ratio 1.2 1.2 - 2.2   Bilirubin Total 0.8 0.0 - 1.2 mg/dL   Alkaline Phosphatase 85 39 - 117 IU/L   AST 22 0 - 40 IU/L   ALT 15 0 - 32 IU/L  Lipid panel     Status: Abnormal   Collection Time: 03/02/17 11:19 AM  Result Value Ref Range   Cholesterol, Total 191 100 - 199 mg/dL   Triglycerides 108 0 - 149 mg/dL   HDL 69 >39 mg/dL   VLDL Cholesterol Cal 22 5 - 40 mg/dL   LDL Calculated 100 (H) 0 - 99 mg/dL   Chol/HDL Ratio 2.8 0.0 - 4.4 ratio    Comment:                                   T. Chol/HDL Ratio                                             Men  Women                               1/2 Avg.Risk  3.4    3.3                                   Avg.Risk  5.0    4.4  2X Avg.Risk  9.6    7.1                                3X Avg.Risk 23.4   11.0   Hemoglobin A1c     Status: Abnormal   Collection Time: 03/02/17 11:19 AM  Result Value Ref Range   Hgb A1c MFr Bld 6.9 (H) 4.8 - 5.6 %    Comment:          Prediabetes: 5.7 - 6.4          Diabetes: >6.4          Glycemic control for adults with diabetes: <7.0    Est. average glucose Bld gHb Est-mCnc 151 mg/dL  Magnesium     Status: None   Collection Time: 03/02/17 11:19 AM  Result Value Ref Range   Magnesium 1.9 1.6 - 2.3 mg/dL  Phosphorus     Status: None   Collection Time: 03/02/17 11:19 AM  Result Value Ref Range   Phosphorus 3.6 2.5 - 4.5 mg/dL  T4, free     Status: None   Collection Time: 03/02/17 11:19 AM  Result Value Ref Range   Free T4 1.51 0.82 - 1.77 ng/dL  TSH     Status: Abnormal   Collection Time: 03/02/17 11:19 AM  Result Value Ref Range   TSH 5.640 (H) 0.450 - 4.500 uIU/mL  VITAMIN D 25 Hydroxy (Vit-D Deficiency, Fractures)     Status: Abnormal   Collection Time: 03/02/17 11:19 AM  Result Value Ref Range   Vit D, 25-Hydroxy 15.9 (L) 30.0 - 100.0 ng/mL    Comment: Vitamin D deficiency has been defined by the [place] and an Endocrine Society practice guideline as a level of serum 25-OH vitamin D less than 20 ng/mL (1,2). The Endocrine Society went on to further define vitamin D insufficiency as a level between 21 and 29 ng/mL (2). 1. IOM (Institute of Medicine). 2010. Dietary reference    intakes for calcium and D. [place]: The    [employer]. 2. Holick MF, Binkley Mellen, Bischoff-Ferrari HA, et al.    Evaluation, treatment, and prevention of vitamin D    deficiency: an Endocrine Society clinical practice    guideline. JCEM. 2011 Jul; 96(7):1911-30.   Vitamin B12     Status: None   Collection Time: 03/02/17 11:19 AM  Result Value Ref Range   Vitamin B-12 1,227 232 - 1,245 pg/mL  CUP PACEART INCLINIC DEVICE CHECK     Status: None   Collection  Time: 03/03/17  8:26 PM  Result Value Ref Range   Date Time Interrogation Session [id number]    Pulse Generator Manufacturer MERM    Pulse Gen Model [id number] Visia AF MRI VR    Pulse Gen Serial Number [id number]    Clinic Name [facility]    Implantable Pulse Generator Type Implantable Cardiac Defibulator    Implantable Pulse Generator Implant Date [id number]    Implantable Lead Manufacturer GUIC    Implantable Lead Model [id number] [employer] SG    Implantable Lead Serial Number [id number]    Implantable Lead Implant Date [id number]    Implantable Lead Location Detail 1 [employer]    Implantable Lead Location [id number]    Lead Channel Setting Sensing Sensitivity 0.3 mV   Lead Channel Setting Pacing Pulse Width 0.4 ms   Lead Channel Setting Pacing Amplitude 2.5 V   Lead Channel Impedance  Value 399 ohm   Lead Channel Impedance Value 399 ohm   Lead Channel Sensing Intrinsic Amplitude 18.75 mV   Lead Channel Sensing Intrinsic Amplitude 19.125 mV   Lead Channel Pacing Threshold Amplitude 1 V   Lead Channel Pacing Threshold Pulse Width 0.4 ms   HighPow Impedance 59 ohm   Battery Status OK    Battery Remaining Longevity 138 mo   Battery Voltage 3.16 V   Brady Statistic RV Percent Paced 0 %   Eval Rhythm SR

## 2017-03-22 NOTE — Patient Instructions (Addendum)
Use [employer] or my fitness pal    Blood Glucose Monitoring, Adult  Monitoring your blood glucose (also know as blood sugar) helps you to manage your diabetes. It also helps you and your health care provider monitor your diabetes and determine how well your treatment plan is working.  WHY SHOULD YOU MONITOR YOUR BLOOD GLUCOSE?  It can help you understand how food, exercise, and medicine affect your blood glucose.  It allows you to know what your blood glucose is at any given moment. You can quickly tell if you are having low blood glucose (hypoglycemia) or high blood glucose (hyperglycemia).  It can help you and your health care provider know how to adjust your medicines.  It can help you understand how to manage an illness or adjust medicine for exercise.  WHEN SHOULD YOU TEST? Your health care provider will help you decide how often you should check your blood glucose. This may depend on the type of diabetes you have, your diabetes control, or the types of medicines you are taking. Be sure to write down all of your blood glucose readings so that this information can be reviewed with your health care provider. See below for examples of testing times that your health care provider may suggest  Type 2 Diabetes  Check your fasting blood sugar ( usually when you get out of bed)  and 2 hours after your largest meal of the day   HOW TO MONITOR YOUR BLOOD GLUCOSE:  Supplies Needed  Blood glucose meter.  Test strips for your meter. Each meter has its own strips. You must use the strips that go with your own meter.  A pricking needle (lancet).  A device that holds the lancet (lancing device).  A journal or log book to write down your results.   Procedure  Wash your hands with soap and water. Alcohol is not preferred.  Prick the side of your finger (not the tip) with the lancet.  Gently milk the finger until a small drop of blood appears.  Follow the instructions that come  with your meter for inserting the test strip, applying blood to the strip, and using your blood glucose meter.  Other Areas to Get Blood for Testing Some meters allow you to use other areas of your body (other than your finger) to test your blood. These areas are called alternative sites. The most common alternative sites are:  The forearm.  The thigh.  The back area of the lower leg.  The palm of the hand.  The blood flow in these areas is slower. Therefore, the blood glucose values you get may be delayed, and the numbers are different from what you would get from your fingers. Do not use alternative sites if you think you are having hypoglycemia. Your reading will not be accurate. Always use a finger if you are having hypoglycemia. Also, if you cannot feel your lows (hypoglycemia unawareness), always use your fingers for your blood glucose checks.  ADDITIONAL TIPS FOR GLUCOSE MONITORING  Do not reuse lancets.  Always carry your supplies with you.  All blood glucose meters have a 24-hour "hotline" number to call if you have questions or need help.  Adjust (calibrate) your blood glucose meter with a control solution after finishing a few boxes of strips.  BLOOD GLUCOSE RECORD KEEPING It is a good idea to keep a daily record or log of your blood glucose readings. Most glucose meters, if not all, keep your glucose records stored in  the meter. Some meters come with the ability to download your records to your home computer. Keeping a record of your blood glucose readings is especially helpful if you are wanting to look for patterns. Make notes to go along with the blood glucose readings because you might forget what happened at that exact time. Keeping good records helps you and your health care provider to work together to achieve good diabetes management.    This information is not intended to replace advice given to you by your health care provider. Make sure you discuss any questions you  have with your health care provider.   Document Released: 06/03/2003 Document Revised: 06/21/2014 Document Reviewed: 10/23/2012 Elsevier Interactive Patient Education 2016 [employer]  Weight management involves adopting a healthy lifestyle that includes a knowledge of nutrition and exercise, a positive attitude and the right kind of motivation. Internal motives such as better health, increased energy, self-esteem and personal control increase your chances of lifelong weight management success.  Remember to have realistic goals and think long-term success. Believe in yourself and you can do it. The following information will give you ideas to help you meet your goals.  Control Your Home Environment  Eat only while sitting down at the kitchen or dining room table. Do not eat while watching television, reading, cooking, talking on the phone, standing at the refrigerator or working on the computer. Keep tempting foods out of the house - don't buy them. Keep tempting foods out of sight. Have low-calorie foods ready to eat. Unless you are preparing a meal, stay out of the kitchen. Have healthy snacks at your disposal, such as small pieces of fruit, vegetables, canned fruit, pretzels, low-fat string cheese and nonfat cottage cheese.  Control Your Work Environment  Do not eat at [employer] or keep tempting snacks at your desk. If you get hungry between meals, plan healthy snacks and bring them with you to work. During your breaks, go for a walk instead of eating. If you work around food, plan in advance the one item you will eat at mealtime. Make it inconvenient to nibble on food by chewing gum, sugarless candy or drinking water or another low-calorie beverage. Do not work through meals. Skipping meals slows down metabolism and may result in overeating at the next meal. If food is available for special occasions, either pick the healthiest  item, nibble on low-fat snacks brought from home, don't have anything offered, choose one option and have a small amount, or have only a beverage.  Control Your Mealtime Environment  Serve your plate of food at the stove or kitchen counter. Do not put the serving dishes on the table. If you do put dishes on the table, remove them immediately when finished eating. Fill half of your plate with vegetables, a quarter with lean protein and a quarter with starch. Use smaller plates, bowls and glasses. A smaller portion will look large when it is in a little dish. Politely refuse second helpings. When fixing your plate, limit portions of food to one scoop/serving or less.   Daily Food Management  Replace eating with another activity that you will not associate with food. Wait 20 minutes before eating something you are craving. Drink a large glass of water or diet soda before eating. Always have a big glass or bottle of water to drink throughout the day. Avoid high-calorie add-ons such as cream with your coffee, butter, mayonnaise and salad dressings.  Shopping: Do not shop when hungry or tired. Shop from a list and avoid buying anything that is not on your list. If you must have tempting foods, buy individual-sized packages and try to find a lower-calorie alternative. Don't taste test in the store. Read food labels. Compare products to help you make the healthiest choices.  Preparation: Chew a piece of gum while cooking meals. Use a quarter teaspoon if you taste test your food. Try to only fix what you are going to eat, leaving yourself no chance for seconds. If you have prepared more food than you need, portion it into individual containers and freeze or refrigerate immediately. Don't snack while cooking meals.  Eating: Eat slowly. Remember it takes about 20 minutes for your stomach to send a message to your brain that it is full. Don't let fake hunger make you think you need more. The  ideal way to eat is to take a bite, put your utensil down, take a sip of water, cut your next bite, take a bit, put your utensil down and so on. Do not cut your food all at one time. Cut only as needed. Take small bites and chew your food well. Stop eating for a minute or two at least once during a meal or snack. Take breaks to reflect and have conversation.  Cleanup and Leftovers: Label leftovers for a specific meal or snack. Freeze or refrigerate individual portions of leftovers. Do not clean up if you are still hungry.  Eating Out and Social Eating  Do not arrive hungry. Eat something light before the meal. Try to fill up on low-calorie foods, such as vegetables and fruit, and eat smaller portions of the high-calorie foods. Eat foods that you like, but choose small portions. If you want seconds, wait at least 20 minutes after you have eaten to see if you are actually hungry or if your eyes are bigger than your stomach. Limit alcoholic beverages. Try a soda water with a twist of lime. Do not skip other meals in the day to save room for the special event.  At Restaurants: Order  la carte rather than buffet style. Order some vegetables or a salad for an appetizer instead of eating bread. If you order a high-calorie dish, share it with someone. Try an after-dinner mint with your coffee. If you do have dessert, share it with two or more people. Don't overeat because you do not want to waste food. Ask for a doggie bag to take extra food home. Tell the server to put half of your entree in a to go bag before the meal is served to you. Ask for salad dressing, gravy or high-fat sauces on the side. Dip the tip of your fork in the dressing before each bite. If bread is served, ask for only one piece. Try it plain without butter or oil. At [employer] where oil and vinegar is served with bread, use only a small amount of oil and a lot of vinegar for dipping.  At a [facility]: Offer  to bring a dish, appetizer or dessert that is low in calories. Serve yourself small portions or tell the host that you only want a small amount. Stand or sit away from the snack table. Stay away from the kitchen or stay busy if you are near the food. Limit your alcohol intake.  At [employer] and Cafeterias: Cover most of your plate with lettuce and/or vegetables. Use a salad plate instead of a dinner plate. After eating,  clear away your dishes before having coffee or tea.  Entertaining at Home: Explore low-fat, low-cholesterol cookbooks. Use single-serving foods like chicken breasts or hamburger patties. Prepare low-calorie appetizers and desserts.   Holidays: Keep tempting foods out of sight. Decorate the house without using food. Have low-calorie beverages and foods on hand for guests. Allow yourself one planned treat a day. Don't skip meals to save up for the holiday feast. Eat regular, planned meals.   Exercise Well  Make exercise a priority and a planned activity in the day. If possible, walk the entire or part of the distance to work. Get an exercise buddy. Go for a walk with a colleague during one of your breaks, go to the gym, run or take a walk with a friend, walk in the mall with a shopping companion. Park at the end of the parking lot and walk to the store or office entrance. Always take the stairs all of the way or at least part of the way to your floor. If you have a desk job, walk around the office frequently. Do leg lifts while sitting at your desk. Do something outside on the weekends like going for a hike or a bike ride.   Have a Healthy Attitude  Make health your weight management priority. Be realistic. Have a goal to achieve a healthier you, not necessarily the lowest weight or ideal weight based on calculations or tables. Focus on a healthy eating style, not on dieting. Dieting usually lasts for a short amount of time and rarely produces long-term  success. Think long term. You are developing new healthy behaviors to follow next month, in a year and in a decade.    This information is for educational purposes only and is not intended to replace the advice of your doctor or health care provider. We encourage you to discuss with your doctor any questions or concerns you may have.        Guidelines for Losing Weight   We want weight loss that will last so you should lose 1-2 pounds a week.  THAT IS IT! Please pick THREE things a month to change. Once it is a habit check off the item. Then pick another three items off the list to become habits.  If you are already doing a habit on the list GREAT!  Cross that item off!  Don't drink your calories. Ie, alcohol, soda, fruit juice, and sweet tea.   Drink more water. Drink a glass when you feel hungry or before each meal.   Eat breakfast - Complex carb and protein (likeDannon light and fit yogurt, oatmeal, fruit, eggs, [place] bacon).  Measure your cereal.  Eat no more than one cup a day. (ie Kashi)  Eat an apple a day.  Add a vegetable a day.  Try a new vegetable a month.  Use Pam! Stop using oil or butter to cook.  Don't finish your plate or use smaller plates.  Share your dessert.  Eat sugar free Jello for dessert or frozen grapes.  Don't eat 2-3 hours before bed.  Switch to whole wheat bread, pasta, and brown rice.  Make healthier choices when you eat out. No fries!  Pick baked chicken, NOT fried.  Don't forget to SLOW DOWN when you eat. It is not going anywhere.   Take the stairs.  Park far away in the parking lot  Lift soup cans (or weights) for 10 minutes while watching TV.  Walk at work for 10 minutes during  break.  Walk outside 1 time a week with your friend, kids, dog, or significant other.  Start a walking group at church.  Walk the mall as much as you can tolerate.   Keep a food diary.  Weigh yourself daily.  Walk for 15 minutes 3 days per  week.  Cook at home more often and eat out less. If life happens and you go back to old habits, it is okay.  Just start over. You can do it!  If you experience chest pain, get short of breath, or tired during the exercise, please stop immediately and inform your doctor.    Before you even begin to attack a weight-loss plan, it pays to remember this: You are not fat. You have fat. Losing weight isn't about blame or shame; it's simply another achievement to accomplish. Dieting is like any other skill-you have to buckle down and work at it. As long as you act in a smart, reasonable way, you'll ultimately get where you want to be. Here are some weight loss pearls for you.   1. It's Not a Diet. It's a Lifestyle Thinking of a diet as something you're on and suffering through only for the short term doesn't work. To shed weight and keep it off, you need to make permanent changes to the way you eat. It's OK to indulge occasionally, of course, but if you cut calories temporarily and then revert to your old way of eating, you'll gain back the weight quicker than you can say yo-yo. Use it to lose it. Research shows that one of the best predictors of long-term weight loss is how many pounds you drop in the first month. For that reason, nutritionists often suggest being stricter for the first two weeks of your new eating strategy to build momentum. Cut out added sugar and alcohol and avoid unrefined carbs. After that, figure out how you can reincorporate them in a way that's healthy and maintainable.  2. There's a Right Way to Exercise Working out burns calories and fat and boosts your metabolism by building muscle. But those trying to lose weight are notorious for overestimating the number of calories they burn and underestimating the amount they take in. Unfortunately, your system is biologically programmed to hold on to extra pounds and that means when you start exercising, your body senses the deficit and ramps  up its hunger signals. If you're not diligent, you'll eat everything you burn and then some. Use it, to lose it. Cardio gets all the exercise glory, but strength and interval training are the real heroes. They help you build lean muscle, which in turn increases your metabolism and calorie-burning ability 3. Don't Overreact to Mild Hunger Some people have a hard time losing weight because of hunger anxiety. To them, being hungry is bad-something to be avoided at all costs-so they carry snacks with them and eat when they don't need to. Others eat because they're stressed out or bored. While you never want to get to the point of being ravenous (that's when bingeing is likely to happen), a hunger pang, a craving, or the fact that it's 3:00 p.m. should not send you racing for the vending machine or obsessing about the energy bar in your purse. Ideally, you should put off eating until your stomach is growling and it's difficult to concentrate.  Use it to lose it. When you feel the urge to eat, use the HALT method. Ask yourself, Am I really hungry? Or am I angry  or anxious, lonely or bored, or tired? If you're still not certain, try the apple test. If you're truly hungry, an apple should seem delicious; if it doesn't, something else is going on. Or you can try drinking water and making yourself busy, if you are still hungry try a healthy snack.  4. Not All Calories Are Created Equal The mechanics of weight loss are pretty simple: Take in fewer calories than you use for energy. But the kind of food you eat makes all the difference. Processed food that's high in saturated fat and refined starch or sugar can cause inflammation that disrupts the hormone signals that tell your brain you're full. The result: You eat a lot more.  Use it to lose it. Clean up your diet. Swap in whole, unprocessed foods, including vegetables, lean protein, and healthy fats that will fill you up and give you the biggest nutritional bang for  your calorie buck. In a few weeks, as your brain starts receiving regular hunger and fullness signals once again, you'll notice that you feel less hungry overall and naturally start cutting back on the amount you eat.  5. Protein, Produce, and Plant-Based Fats Are Your Weight-Loss Trinity Here's why eating the three Ps regularly will help you drop pounds. Protein fills you up. You need it to build lean muscle, which keeps your metabolism humming so that you can torch more fat. People in a weight-loss program who ate double the recommended daily allowance for protein (about 110 grams for a 150-pound woman) lost 70 percent of their weight from fat, while people who ate the RDA lost only about 40 percent, one study found. Produce is packed with filling fiber. "It's very difficult to consume too many calories if you're eating a lot of vegetables. Example: Three cups of broccoli is a lot of food, yet only 93 calories. (Fruit is another story. It can be easy to overeat and can contain a lot of calories from sugar, so be sure to monitor your intake.) Plant-based fats like olive oil and those in avocados and nuts are healthy and extra satiating.  Use it to lose it. Aim to incorporate each of the three Ps into every meal and snack. People who eat protein throughout the day are able to keep weight off, according to a study in the [place] of [employer]. In addition to meat, poultry and seafood, good sources are beans, lentils, eggs, tofu, and yogurt. As for fat, keep portion sizes in check by measuring out salad dressing, oil, and nut butters (shoot for one to two tablespoons). Finally, eat veggies or a little fruit at every meal. People who did that consumed 308 fewer calories but didn't feel any hungrier than when they didn't eat more produce.  7. How You Eat Is As Important As What You Eat In order for your brain to register that you're full, you need to focus on what you're eating. Sit down  whenever you eat, preferably at a table. Turn off the TV or computer, put down your phone, and look at your food. Smell it. Chew slowly, and don't put another bite on your fork until you swallow. When women ate lunch this attentively, they consumed 30 percent less when snacking later than those who listened to an audiobook at lunchtime, according to a study in the [place] of [employer]. 8. Weighing Yourself Really Works The scale provides the best evidence about whether your efforts are paying off. Seeing the numbers tick up or down  or stagnate is motivation to keep going-or to rethink your approach. A 2015 study at [facility] found that daily weigh-ins helped people lose more weight, keep it off, and maintain that loss, even after two years. Use it to lose it. Step on the scale at the same time every day for the best results. If your weight shoots up several pounds from one weigh-in to the next, don't freak out. Eating a lot of salt the night before or having your period is the likely culprit. The number should return to normal in a day or two. It's a steady climb that you need to do something about. 9. Too Much Stress and Too Little Sleep Are Your Enemies When you're tired and frazzled, your body cranks up the production of cortisol, the stress hormone that can cause carb cravings. Not getting enough sleep also boosts your levels of ghrelin, a hormone associated with hunger, while suppressing leptin, a hormone that signals fullness and satiety. People on a diet who slept only five and a half hours a night for two weeks lost 55 percent less fat and were hungrier than those who slept eight and a half hours, according to a study in the [place]. Use it to lose it. Prioritize sleep, aiming for seven hours or more a night, which research shows helps lower stress. And make sure you're getting quality zzz's. If a snoring spouse or a fidgety cat wakes you up frequently  throughout the night, you may end up getting the equivalent of just four hours of sleep, according to a study from [facility]. Keep pets out of the bedroom, and use a white-noise app to drown out snoring. 10. You Will Hit a plateau-And You Can Bust Through It As you slim down, your body releases much less leptin, the fullness hormone.  If you're not strength training, start right now. Building muscle can raise your metabolism to help you overcome a plateau. To keep your body challenged and burning calories, incorporate new moves and more intense intervals into your workouts or add another sweat session to your weekly routine. Alternatively, cut an extra 100 calories or so a day from your diet. Now that you've lost weight, your body simply doesn't need as much fuel.    Since food equals calories, in order to lose weight you must either eat fewer calories, exercise more to burn off calories with activity, or both. Food that is not used to fuel the body is stored as fat. A major component of losing weight is to make smarter food choices. Here's how:  1)   Limit non-nutritious foods, such as: Sugar, honey, syrups and candy Pastries, donuts, pies, cakes and cookies Soft drinks, sweetened juices and alcoholic beverages  2)  Cut down on high-fat foods by: - Choosing poultry, fish or lean red meat - Choosing low-fat cooking methods, such as baking, broiling, steaming, grilling and boiling - Using low-fat or non-fat dairy products - Using vinaigrette, herbs, lemon or fat-free salad dressings - Avoiding fatty meats, such as bacon, sausage, franks, ribs and luncheon meats - Avoiding high-fat snacks like nuts, chips and chocolate - Avoiding fried foods - Using less butter, margarine, oil and mayonnaise - Avoiding high-fat gravies, cream sauces and cream-based soups  3) Eat a variety of foods, including: - Fruit and vegetables that are raw, steamed or baked - Whole grains, breads, cereal, rice  and pasta - Dairy products, such as low-fat or non-fat milk or yogurt, low-fat cottage  cheese and low-fat cheese - Protein-rich foods like chicken, [place], fish, lean meat and legumes, or beans  4) Change your eating habits by: - Eat three balanced meals a day to help control your hunger - Watch portion sizes and eat small servings of a variety of foods - Choose low-calorie snacks - Eat only when you are hungry and stop when you are satisfied - Eat slowly and try not to perform other tasks while eating - Find other activities to distract you from food, such as walking, taking up a hobby or being involved in the community - Include regular exercise in your daily routine ( minimum of 20 min of moderate-intensity exercise at least 5 days/week)  - Find a support group, if necessary, for emotional support in your weight loss journey           Easy ways to cut 100 calories   1. Eat your eggs with hot sauce OR salsa instead of cheese.  Eggs are great for breakfast, but many people consider eggs and cheese to be BFFs. Instead of cheese-1 oz. of cheddar has 114 calories-top your eggs with hot sauce, which contains no calories and helps with satiety and metabolism. Salsa is also a great option!!  2. Top your toast, waffles or pancakes with fresh berries instead of jelly or syrup. Half a cup of berries-fresh, frozen or thawed-has about 40 calories, compared with 2 tbsp. of maple syrup or jelly, which both have about 100 calories. The berries will also give you a good punch of fiber, which helps keep you full and satisfied and won't spike blood sugar quickly like the jelly or syrup. 3. Swap the non-fat latte for black coffee with a splash of half-and-half. Contrary to its name, that non-fat latte has 130 calories and a startling 19g of carbohydrates per 16 oz. serving. Replacing that 'light' drinkable dessert with a black coffee with a splash of half-and-half saves you more than 100 calories per 16  oz. serving. 4. Sprinkle salads with freeze-dried raspberries instead of dried cranberries. If you want a sweet addition to your nutritious salad, stay away from dried cranberries. They have a whopping 130 calories per  cup and 30g carbohydrates. Instead, sprinkle freeze-dried raspberries guilt-free and save more than 100 calories per  cup serving, adding 3g of belly-filling fiber. 5. Go for mustard in place of mayo on your sandwich. Mustard can add really nice flavor to any sandwich, and there are tons of varieties, from spicy to honey. A serving of mayo is 95 calories, versus 10 calories in a serving of mustard.  Or try an avocado mayo spread: You can find the recipe few click this link: https://www.californiaavocado.com/recipes/recipe-container/[place]-avocado-mayo 6. Choose a DIY salad dressing instead of the store-bought kind. Mix Dijon or whole grain mustard with low-fat Kefir or red wine vinegar and garlic. 7. Use hummus as a spread instead of a dip. Use hummus as a spread on a high-fiber cracker or tortilla with a sandwich and save on calories without sacrificing taste. 8. Pick just one salad "accessory." Salad isn't automatically a calorie winner. It's easy to over-accessorize with toppings. Instead of topping your salad with nuts, avocado and cranberries (all three will clock in at 313 calories), just pick one. The next day, choose a different accessory, which will also keep your salad interesting. You don't wear all your jewelry every day, right? 9. Ditch the white pasta in favor of spaghetti squash. One cup of cooked spaghetti squash has about 40 calories, compared  with traditional spaghetti, which comes with more than 200. Spaghetti squash is also nutrient-dense. It's a good source of fiber and Vitamins A and C, and it can be eaten just like you would eat pasta-with a great tomato sauce and [place] meatballs or with pesto, tofu and spinach, for example. 10. Dress up your chili, soups  and stews with non-fat [place] yogurt instead of sour cream. Just a 'dollop' of sour cream can set you back 115 calories and a whopping 12g of fat-seven of which are of the artery-clogging variety. Added bonus: [place] yogurt is packed with muscle-building protein, calcium and B Vitamins. 11. Mash cauliflower instead of mashed potatoes. One cup of traditional mashed potatoes-in all their creamy goodness-has more than 200 calories, compared to mashed cauliflower, which you can typically eat for less than 100 calories per 1 cup serving. Cauliflower is a great source of the antioxidant indole-3-carbinol (I3C), which may help reduce the risk of some cancers, like breast cancer. 12. Ditch the ice cream sundae in favor of a [place] yogurt parfait. Instead of a cup of ice cream or fro-yo for dessert, try 1 cup of nonfat Greek yogurt topped with fresh berries and a sprinkle of cacao nibs. Both toppings are packed with antioxidants, which can help reduce cellular inflammation and oxidative damage. And the comparison is a no-brainer: One cup of ice cream has about 275 calories; one cup of frozen yogurt has about 230; and a cup of Greek yogurt has just 130, plus twice the protein, so you're less likely to return to the freezer for a second helping. 13. Put olive oil in a spray container instead of using it directly from the bottle. Each tablespoon of olive oil is 120 calories and 15g of fat. Use a mister instead of pouring it straight into the pan or onto a salad. This allows for portion control and will save you more than 100 calories. 14. When baking, substitute canned pumpkin for butter or oil. Canned pumpkin-not pumpkin pie mix-is loaded with Vitamin A, which is important for skin and eye health, as well as immunity. And the comparisons are pretty crazy:  cup of canned pumpkin has about 40 calories, compared to butter or oil, which has more than 800 calories. Yes, 800 calories. Applesauce and mashed banana can also  serve as good substitutions for butter or oil, usually in a 1:1 ratio. 15. Top casseroles with high-fiber cereal instead of breadcrumbs. Breadcrumbs are typically made with white bread, while breakfast cereals contain 5-9g of fiber per serving. Not only will you save more than 150 calories per  cup serving, the swap will also keep you more full and you'll get a metabolism boost from the added fiber. 16. Snack on pistachios instead of macadamia nuts. Believe it or not, you get the same amount of calories from 35 pistachios (100 calories) as you would from only five macadamia nuts. 17. Chow down on kale chips rather than potato chips. This is my favorite 'don't knock it 'till you try it' swap. Kale chips are so easy to make at home, and you can spice them up with a little grated parmesan or chili powder. Plus, they're a mere fraction of the calories of potato chips, but with the same crunch factor we crave so often. 18. Add seltzer and some fruit slices to your cocktail instead of soda or fruit juice. One cup of soda or fruit juice can pack on as much as 140 calories. Instead, use seltzer and fruit slices. The  fruit provides valuable phytochemicals, such as flavonoids and anthocyanins, which help to combat cancer and stave off the aging process.

## 2017-03-24 NOTE — Progress Notes (Signed)
Location of Breast Cancer: Left Breast  Histology per Pathology Report:  12/07/16 Diagnosis Breast, left, needle core biopsy, 9:30 o'clock - INVASIVE DUCTAL CARCINOMA. - DUCTAL CARCINOMA IN SITU  Receptor Status: ER(100%), PR (NEG), Her2-neu (NEG), Ki-(10%)  02/21/17 Procedure:                 Inject blue dye left breast                                      Left partial mastectomy with radioactive seed localization                                      Reexcision superior medial margin                                      Left axillary deep sentinel lymph node biopsy Surgeon:                     Edsel Petrin. Dalbert Batman, M.D., [facility]  Did patient present with symptoms or was this found on screening mammography?: She palpated a lump on the Right Breast. and presented to her primary care physician. She also mentioned itching to her Left Nipple while getting the lump checked and a bilateral Breast MRI was completed.   Past/Anticipated interventions by surgeon, if any: 12/09/16 Dr. Dalbert Batman. Referred to Medical and Radiation Oncology.   Past/Anticipated interventions by medical oncology, if any:  Dr. Jana Hakim 02/01/17 She will not receive chemotherapy She will begin Letrozole Next appointment scheduled for 04/05/17   Lymphedema issues, if any: She denies lymphedema to her Left arm. She does report numbness and swelling to her Right arm since her Left Lumpectomy.   Pain issues, if any:  She reports pain from fibromyalgia a 8/10. She had a reaction to tegarderm used during her surgery. She is using a topical cream to relieve the itching and burning.   SAFETY ISSUES:  Prior radiation? Yes, Right Breast 05/17/2007, 5040 cGy plus a 1260 Gy Boost.   Pacemaker/ICD? Yes, Left ICD changed to Right side on 02/21/17 by Dr. Caryl Comes (same day as lumpectomy).  Possible current pregnancy? No, tubal ligation history.   Is the patient on methotrexate? 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 She went on to participate in the ECOG study for adjuvant chemotherapy (arm D), which consisted of Adriamycin/Cytoxan x 4 cycles, followed by Taxol/Avastin; chemo was stopped d/t cardiomyopathy with EF decreasing to 35%. She went on to completeadjuvant radiation therapyand5 years ofanti-estrogen therapywith Letrozole from 05/2007-08/2011.  BP (!) 91/57   Pulse 75   Temp 97.9 F (36.6 C)   Ht 5' 7" (1.702 m)   Wt 261 lb (118.4 kg)   SpO2 98% Comment: room air  BMI 40.88 kg/m    Wt Readings from Last 3 Encounters:  04/01/17 261 lb (118.4 kg)  03/22/17 265 lb (120.2 kg)  02/21/17 257 lb (116.6 kg)

## 2017-03-28 ENCOUNTER — Other Ambulatory Visit: Payer: Self-pay | Admitting: Internal Medicine

## 2017-03-29 ENCOUNTER — Ambulatory Visit: Payer: Medicare Other | Admitting: Radiation Oncology

## 2017-03-29 ENCOUNTER — Ambulatory Visit: Payer: Medicare Other

## 2017-03-31 DIAGNOSIS — Z7984 Long term (current) use of oral hypoglycemic drugs: Secondary | ICD-10-CM | POA: Diagnosis not present

## 2017-03-31 DIAGNOSIS — H11423 Conjunctival edema, bilateral: Secondary | ICD-10-CM | POA: Diagnosis not present

## 2017-03-31 DIAGNOSIS — H40013 Open angle with borderline findings, low risk, bilateral: Secondary | ICD-10-CM | POA: Diagnosis not present

## 2017-03-31 DIAGNOSIS — H5213 Myopia, bilateral: Secondary | ICD-10-CM | POA: Diagnosis not present

## 2017-03-31 DIAGNOSIS — H40003 Preglaucoma, unspecified, bilateral: Secondary | ICD-10-CM | POA: Diagnosis not present

## 2017-03-31 DIAGNOSIS — E119 Type 2 diabetes mellitus without complications: Secondary | ICD-10-CM | POA: Diagnosis not present

## 2017-03-31 DIAGNOSIS — H11153 Pinguecula, bilateral: Secondary | ICD-10-CM | POA: Diagnosis not present

## 2017-03-31 DIAGNOSIS — H40033 Anatomical narrow angle, bilateral: Secondary | ICD-10-CM | POA: Diagnosis not present

## 2017-03-31 DIAGNOSIS — H1045 Other chronic allergic conjunctivitis: Secondary | ICD-10-CM | POA: Diagnosis not present

## 2017-03-31 DIAGNOSIS — H04123 Dry eye syndrome of bilateral lacrimal glands: Secondary | ICD-10-CM | POA: Diagnosis not present

## 2017-03-31 DIAGNOSIS — H18413 Arcus senilis, bilateral: Secondary | ICD-10-CM | POA: Diagnosis not present

## 2017-03-31 DIAGNOSIS — H25013 Cortical age-related cataract, bilateral: Secondary | ICD-10-CM | POA: Diagnosis not present

## 2017-03-31 LAB — HM DIABETES EYE EXAM

## 2017-04-01 ENCOUNTER — Ambulatory Visit
Admission: RE | Admit: 2017-04-01 | Discharge: 2017-04-01 | Disposition: A | Discharge status: Home / Self Care | Payer: Medicare Other | Source: Ambulatory Visit | Attending: Radiation Oncology | Admitting: Radiation Oncology

## 2017-04-01 ENCOUNTER — Encounter: Payer: Self-pay | Admitting: Radiation Oncology

## 2017-04-01 VITALS — BP 91/57 | HR 75 | Temp 97.9°F | Ht 67.0 in | Wt 261.0 lb

## 2017-04-01 DIAGNOSIS — E785 Hyperlipidemia, unspecified: Secondary | ICD-10-CM | POA: Diagnosis not present

## 2017-04-01 DIAGNOSIS — C50212 Malignant neoplasm of upper-inner quadrant of left female breast: Secondary | ICD-10-CM

## 2017-04-01 DIAGNOSIS — Z170 Estrogen receptor positive status [ER+]: Secondary | ICD-10-CM | POA: Insufficient documentation

## 2017-04-01 DIAGNOSIS — M797 Fibromyalgia: Secondary | ICD-10-CM | POA: Diagnosis not present

## 2017-04-01 DIAGNOSIS — Z79899 Other long term (current) drug therapy: Secondary | ICD-10-CM | POA: Insufficient documentation

## 2017-04-01 DIAGNOSIS — Z9889 Other specified postprocedural states: Secondary | ICD-10-CM | POA: Diagnosis not present

## 2017-04-01 DIAGNOSIS — Z95810 Presence of automatic (implantable) cardiac defibrillator: Secondary | ICD-10-CM | POA: Insufficient documentation

## 2017-04-01 DIAGNOSIS — I110 Hypertensive heart disease with heart failure: Secondary | ICD-10-CM | POA: Insufficient documentation

## 2017-04-01 DIAGNOSIS — C50912 Malignant neoplasm of unspecified site of left female breast: Secondary | ICD-10-CM

## 2017-04-01 DIAGNOSIS — K219 Gastro-esophageal reflux disease without esophagitis: Secondary | ICD-10-CM | POA: Diagnosis not present

## 2017-04-01 DIAGNOSIS — F419 Anxiety disorder, unspecified: Secondary | ICD-10-CM | POA: Insufficient documentation

## 2017-04-01 DIAGNOSIS — I509 Heart failure, unspecified: Secondary | ICD-10-CM | POA: Insufficient documentation

## 2017-04-01 DIAGNOSIS — E1142 Type 2 diabetes mellitus with diabetic polyneuropathy: Secondary | ICD-10-CM | POA: Diagnosis not present

## 2017-04-01 DIAGNOSIS — J449 Chronic obstructive pulmonary disease, unspecified: Secondary | ICD-10-CM | POA: Diagnosis not present

## 2017-04-01 DIAGNOSIS — F329 Major depressive disorder, single episode, unspecified: Secondary | ICD-10-CM | POA: Diagnosis not present

## 2017-04-01 NOTE — Progress Notes (Signed)
Radiation Oncology         [phone]) [phone] ________________________________  Name: [name] MRN: [id number]  Date: 04/01/2017  DOB: 1970/11/05  Follow-Up Visit  Note  Outpatient  CC: Mellody Dance, DO  Magrinat, Virgie Dad, MD  Diagnosis:      ICD-10-CM   1. Malignant neoplasm of upper-inner quadrant of left breast in female, estrogen receptor positive ([place]) C50.212    Z17.0   2. Malignant neoplasm of left female breast, unspecified estrogen receptor status, unspecified site of breast ([place]) C50.912     Cancer Staging Breast cancer, right breast ([place]) Staging form: Breast, AJCC 7th Edition - Clinical: Stage IIB (T2, N1, M0) - Signed by Chauncey Cruel, MD on 05/29/2014 - Pathologic: No stage assigned - Unsigned  Malignant neoplasm of upper-inner quadrant of left breast in female, estrogen receptor positive ([place]) Staging form: Breast, AJCC 8th Edition - Pathologic: Stage IA (pT1b, pN0, cM0, G1, ER: Positive, PR: Negative, HER2: Negative) - Signed by Gardenia Phlegm, NP on 03/09/2017   T1bN0M0 Stage I Invasive Ductal Carcinoma of the left breast, ER (100%) positive / PR 0% (NEG) / Her2 (NEG), Ki67 (10%) Grade 2.  CHIEF COMPLAINT: Here to discuss management of left breast cancer  Narrative:  The patient returns today for follow-up.     Since consultation, she underwent left breast lumpectomy on 02/21/17 performed by Dr Dalbert Batman. This was performed well and without complication. Surgical pathology report details this as an invasive ductal carcinoma with low grade DCIS spanning 1.0cm with associated calcifications. Surgical resection margins were negative and the 2 lymph nodes surveyed were negative for malignancy.   Overall, she is doing well. During her surgery her ICD was switched left side of her chest for her treatments. She is recovering from her surgery well and she is healing well. She notes that she did have a reaction to the Tegaderm, but this has resolved  following dermatology consult and intervention. She continues taking Letrozole and they are not opting for chemotherapy within her regimen at this time.            ALLERGIES:  is allergic to ace inhibitors; reglan [metoclopramide hcl]; and adhesive [tape].  Meds: Current Outpatient Prescriptions  Medication Sig Dispense Refill  . acetaminophen (TYLENOL) 500 MG tablet Take 1,000 mg by mouth 2 (two) times daily as needed for moderate pain.    Marland Kitchen albuterol (PROVENTIL HFA;VENTOLIN HFA) 108 (90 Base) MCG/ACT inhaler Inhale 1-2 puffs into the lungs every 6 (six) hours as needed for wheezing or shortness of breath.     . baclofen (LIORESAL) 10 MG tablet TAKE 1 TABLET BY MOUTH 2 (TWO) TIMES DAILY AS NEEDED FOR MUSCLE SPASMS. 60 tablet 0  . BLACK CURRANT SEED OIL PO Take 15 mLs by mouth daily.     . Blood Glucose Monitoring Suppl (FREESTYLE FREEDOM LITE) w/Device KIT Check fasting and 2 hour PP 1 each 1  . carvedilol (COREG) 25 MG tablet Take 0.5 tablets (12.5 mg total) by mouth 2 (two) times daily with a meal. 60 tablet 6  . Cholecalciferol (VITAMIN D3) 5000 UNITS CAPS Take 5,000 Units by mouth daily.     . Coenzyme Q10 (CO Q-10) 100 MG CAPS Take 100 mg by mouth daily.    . Cyanocobalamin 2500 MCG SUBL Place 2,500 mcg under the tongue daily.    . cyclobenzaprine (FLEXERIL) 10 MG tablet Take 10 mg by mouth 3 (three) times daily as needed for muscle spasms.    . digoxin (  LANOXIN) 0.125 MG tablet TAKE 0.5 TABLETS (0.0625 MG TOTAL) BY MOUTH DAILY. 15 tablet 2  . divalproex (DEPAKOTE ER) 500 MG 24 hr tablet Take 1 tablet in a.m. and 2 tablets in p.m. 90 tablet 5  . DULoxetine (CYMBALTA) 30 MG capsule Take 1 capsule (30 mg total) by mouth 2 (two) times daily. 60 capsule 5  . ferrous sulfate 325 (65 FE) MG tablet Take 325 mg by mouth daily with breakfast.    . FREESTYLE UNISTICK II LANCETS MISC Check fasting blood sugar in the morning and check sugar after largest meal of the day. 100 each 12  .  GLUCOSAMINE-CHONDROITIN PO Take 1,200 mg by mouth daily.    Marland Kitchen glucose blood (FREESTYLE TEST STRIPS) test strip To be filled with Freedom Lite test strips.   Check fasting blood sugar in the morning and check sugar after largest meal of the day. 100 each 12  . hydrOXYzine (ATARAX/VISTARIL) 25 MG tablet Take 25 mg by mouth at bedtime as needed for itching (DOESN'T TAKE WITH TRAZODONE).    Marland Kitchen ivabradine (CORLANOR) 5 MG TABS tablet Take 1 tablet (5 mg total) by mouth 2 (two) times daily with a meal. 180 tablet 3  . letrozole (FEMARA) 2.5 MG tablet Take 1 tablet (2.5 mg total) by mouth daily. 90 tablet 4  . loratadine (CLARITIN) 10 MG tablet Take 10 mg by mouth daily.    Marland Kitchen losartan (COZAAR) 25 MG tablet Take 1 tablet (25 mg total) by mouth daily. (Patient taking differently: Take 25 mg by mouth at bedtime. ) 90 tablet 3  . Menthol, Topical Analgesic, (ICY HOT EX) Apply 1 application topically daily as needed (muscle pain).    Marland Kitchen metolazone (ZAROXOLYN) 2.5 MG tablet Take 1 tablet (2.5 mg total) by mouth daily as needed (edema). 15 tablet 0  . Milk Thistle 150 MG CAPS Take 450 mg by mouth daily.     . Multiple Vitamin (MULTIVITAMIN WITH MINERALS) TABS Take 1 tablet by mouth daily. Reported on 10/13/2015    . omeprazole (PRILOSEC) 40 MG capsule TAKE 1 CAPSULE (40 MG TOTAL) BY MOUTH 2 (TWO) TIMES DAILY BEFORE A MEAL. BREAKFAST AND SUPPER 60 capsule 9  . ondansetron (ZOFRAN) 4 MG tablet Take 1 tablet (4 mg total) by mouth every 8 (eight) hours as needed for nausea or vomiting. 20 tablet 3  . PATADAY 0.2 % SOLN Place 1 drop into both eyes daily as needed (allergies).     . pentosan polysulfate (ELMIRON) 100 MG capsule Take 100 mg by mouth 2 (two) times daily as needed (bladder spasms).     . potassium chloride SA (K-DUR,KLOR-CON) 20 MEQ tablet Take 2 tablets (40 mEq total) by mouth daily. 180 tablet 3  . Sodium Chloride-Sodium Bicarb (NETI POT SINUS WASH NA) Place 1 Dose into the nose daily as needed (congestion).     Marland Kitchen spironolactone (ALDACTONE) 25 MG tablet TAKE 0.5 TABLETS (12.5 MG TOTAL) BY MOUTH DAILY. 45 tablet 3  . torsemide (DEMADEX) 20 MG tablet TAKE 2 TABLETS (40 MG TOTAL) BY MOUTH DAILY. 60 tablet 6  . traZODone (DESYREL) 50 MG tablet Take 50 mg by mouth at bedtime as needed for sleep.     Marland Kitchen triamcinolone cream (KENALOG) 0.1 % Apply 1 application topically 2 (two) times daily as needed (eczema).    . Vitamin D, Ergocalciferol, (DRISDOL) 50000 units CAPS capsule Take 1 capsule (50,000 Units total) by mouth every 7 (seven) days. 12 capsule 3   No current facility-administered medications  for this encounter.    ROS: as above  Physical Findings:  height is '5\' 7"'$  (1.702 m) and weight is 261 lb (118.4 kg). Her temperature is 97.9 F (36.6 C). Her blood pressure is 91/57 (abnormal) and her pulse is 75. Her oxygen saturation is 98%. .     General: Alert and oriented, in no acute distress HEENT: Head is normocephalic. Extraocular movements are intact. Oropharynx is clear. Neck: Neck is supple, no palpable cervical or supraclavicular lymphadenopathy. Heart: She has distant heart sounds that are regular in rate and rhythm with no murmurs  Chest: Clear to auscultation bilaterally, with no rhonchi, wheezes, or rales.  Abdomen: Soft, nontender, nondistended, with no rigidity or guarding. Extremities: No cyanosis or edema. Lymphatics: see Neck Exam Musculoskeletal: symmetric strength and muscle tone throughout. Neurologic: No obvious focalities. Speech is fluent.  Psychiatric: Judgment and insight are intact. Affect is appropriate. Breast exam reveals Her scars are healing well from her cardiac device transfer over her chest. She is also healing well form her lumpectomy and sentinel lymph node biopsy on the left. She has a moderate seroma around the 10 o'clock position on the left breast. The right breast is smaller with evidence of post-surgical and post-radiation change.   Lab Findings: Lab Results    Component Value Date   WBC 12.5 (H) 03/02/2017   HGB 12.6 03/02/2017   HCT 37.1 03/02/2017   MCV 92 03/02/2017   PLT 170 03/02/2017    CMP Latest Ref Rng & Units 03/02/2017 02/15/2017 02/01/2017  Glucose 65 - 99 mg/dL 94 95 89  BUN 6 - 24 mg/dL 16 17 23.2  Creatinine 0.57 - 1.00 mg/dL 0.65 1.01(H) 1.3(H)  Sodium 134 - 144 mmol/L 140 136 134(L)  Potassium 3.5 - 5.2 mmol/L 3.9 4.1 4.0  Chloride 96 - 106 mmol/L 95(L) 100(L) -  CO2 20 - 29 mmol/L 29 27 31(H)  Calcium 8.7 - 10.2 mg/dL 9.0 8.8(L) 10.4  Total Protein 6.0 - 8.5 g/dL 7.1 7.7 9.0(H)  Total Bilirubin 0.0 - 1.2 mg/dL 0.8 1.2 2.16(H)  Alkaline Phos 39 - 117 IU/L 85 73 111  AST 0 - 40 IU/L '22 19 20  '$ ALT 0 - 32 IU/L 15 12(L) 9   Radiographic Findings: No results found.  Impression/Plan: Left breast cancer  We discussed adjuvant radiotherapy today.  I recommend 4 weeks of radiation to the left breast in order to reduce the risk of local recurrence by 2/3.  The risks, benefits and side effects of this treatment were discussed in detail.  She understands that radiotherapy is associated with skin irritation and fatigue in the acute setting. Late effects can include cosmetic changes and rare injury to internal organs. Additionally, given her cardiac history with ICD placement we will have to take extra precautions to ensure that her heart is spared as much as possible. We discussed how we will coach her on and implement the breath holding technique throughout her course of radiation to assist with this. She is enthusiastic about proceeding with treatment. A consent form has been signed and placed in her chart.  A total of 3 medically necessary complex treatment devices will be fabricated and supervised by me: Two fields with MLCs for custom blocks to protect heart, and lungs;  and, a Vac-lok. MORE COMPLEX DEVICES MAY BE MADE IN DOSIMETRY FOR FIELD IN FIELD BEAMS FOR DOSE HOMOGENEITY.  I have requested : 3D Simulation which is medically  necessary to give adequate dose to at risk tissues  while sparing lungs and heart.  I have requested a DVH of the following structures: lungs, heart, left lumpectomy cavity. We will begin her treatments approximately on 04/11/17. We will plan to have her return next week for CT simulation and treatment planning.   The patient will receive 40.05 Gy in 15 fractions to the left breast with two fields.  This will be followed by a boost.  I spent 25 minutes face to face with the patient and more than 50% of that time was spent in counseling and/or coordination of care. _____________________________________   Eppie Gibson, MD  This document serves as a record of services personally performed by Eppie Gibson, MD. It was created on her behalf by Reola Mosher, a trained medical scribe. The creation of this record is based on the scribe's personal observations and the provider's statements to them. This document has been checked and approved by the attending provider.

## 2017-04-05 ENCOUNTER — Encounter: Payer: Self-pay | Admitting: Family Medicine

## 2017-04-05 ENCOUNTER — Telehealth: Payer: Self-pay | Admitting: Internal Medicine

## 2017-04-05 ENCOUNTER — Other Ambulatory Visit: Payer: Medicare Other

## 2017-04-05 ENCOUNTER — Ambulatory Visit (HOSPITAL_BASED_OUTPATIENT_CLINIC_OR_DEPARTMENT_OTHER): Payer: Medicare Other | Admitting: Oncology

## 2017-04-05 VITALS — BP 90/52 | HR 81 | Temp 97.8°F | Wt 267.4 lb

## 2017-04-05 DIAGNOSIS — Z79811 Long term (current) use of aromatase inhibitors: Secondary | ICD-10-CM

## 2017-04-05 DIAGNOSIS — C50212 Malignant neoplasm of upper-inner quadrant of left female breast: Secondary | ICD-10-CM

## 2017-04-05 DIAGNOSIS — C773 Secondary and unspecified malignant neoplasm of axilla and upper limb lymph nodes: Secondary | ICD-10-CM | POA: Diagnosis not present

## 2017-04-05 DIAGNOSIS — Z170 Estrogen receptor positive status [ER+]: Secondary | ICD-10-CM

## 2017-04-05 DIAGNOSIS — C50811 Malignant neoplasm of overlapping sites of right female breast: Secondary | ICD-10-CM | POA: Diagnosis not present

## 2017-04-05 MED ORDER — VENLAFAXINE HCL ER 37.5 MG PO CP24: 37.5000 mg | ORAL_CAPSULE | Freq: Every day | ORAL | 6 refills | Status: AC

## 2017-04-05 NOTE — Telephone Encounter (Signed)
Confirmed with jennifer that we received procedural request form - will fax back when Dr. Caryl Comes is in the office this afternoon

## 2017-04-05 NOTE — Progress Notes (Signed)
[facility]  [phone] [phone]     ID: [name] DOB: 16-Jan-1971  MR#: [id number]  JYN#:[id number]  Patient Care Team: Thomasene Lot, DO as PCP - General (Family Medicine) Bensimhon, Bevelyn Buckles, MD as Consulting Physician (Cardiology) Jarnell Cordaro, Valentino Hue, MD as Consulting Physician (Oncology) Duke Salvia, MD as Consulting Physician (Cardiology) Dohmeier, Porfirio Mylar, MD as Consulting Physician (Neurology) Coralyn Helling, MD as Consulting Physician (Pulmonary Disease) Claud Kelp, MD as Consulting Physician (General Surgery) Lonie Peak, MD as Attending Physician (Radiation Oncology) Kirkland Hun, MD as Consulting Physician (Obstetrics and Gynecology) Ranee Gosselin, MD as Consulting Physician (Orthopedic Surgery) Jamison Neighbor, MD as Consulting Physician (Urology) Kirkland Hun, MD as Consulting Physician (Obstetrics and Gynecology) PCP: Thomasene Lot, DO GYN: Kirkland Hun SU:  OTHER MD: Arvilla Meres, Chipper Herb, Autumn Patty Dohmeier  CHIEF COMPLAINT: Contralateral estrogen receptor positive breast cancer  CURRENT TREATMENT: Adjuvant radiation pending   INTERVAL HISTORY: [name] returns today for follow-up and treatment of her left-sided breast cancer. She is accompanied by her husband. Since last visit here she underwent genetics testing, which found no deleterious mutations--there was a variant of uncertain significance in the ATM gene, which will be followed up on.  She then proceeded to definitive left lumpectomy and sentinel lymph node sampling, on 02/21/2017. 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 Both sentinel lymph nodes were clear. The margins were clear.   She is now ready to proceed to adjuvant radiation   REVIEW OF SYSTEMS:  [name] tolerated surgery relatively well. She did have an allergic reaction to tegaderm. She reports that after  her second surgery she became more emotional and notes feeling the same feelings after her first surgery. She states becoming depressed and anxious. She is hoping to be able to get in with Dr. Meredith Staggers who is a neuro psychologist. IN the mean time she has been confiding in family and friends. She is not on any medication at this time. She has not been exercising because of her bilateral hip, knees, shoulders and back pain. She is seeing a specialist in November. She denies unusual headaches, visual changes, nausea, vomiting, or dizziness. There has been no unusual cough, phlegm production, or pleurisy. This been no change in bowel or bladder habits. She denies unexplained fatigue or unexplained weight loss, bleeding, rash, or fever. A detailed review of systems was otherwise entirely stable.   BREAST CANCER HISTORY:  from Dr. Theron Arista Rubin's original intake note dated 08/31/2006   "This woman has been in good health.  She had a screening mammogram done via mobile mammogram unit.  A baseline mammogram did show a lesion or suspicious pigmented area at 9 o'clock in the right breast.  She was [facility].  A digital right diagnostic mammogram and ultrasound was done on 08/01/2006.  Physical examination did palpate a 1.5 cm mass at 9 o'clock position 7 cm from the nipple.  Ultrasound showed this to be 1.5 x 1.3 x 1.0 cm.  Biopsy was recommended.  Biopsy on 08/01/2006 showed invasive adenocarcinoma associated with DCIS, which appeared to be high-grade.  ER and PR were positive at 99% and 98% respectively.  Proliferative index was 16%.  HER-2/neu was 1+.  The patient underwent a lumpectomy and sentinel lymph node evaluation on 08/22/2006.  Prior to that, a MRI of both breasts was done on 08/10/2006, which documented a 2.4 cm spiculated mass in the breast.  No other abnormalities were seen.  The lumpectomy and sentinel lymph node evaluation on 08/22/2006 showed a 2.6 cm, grade 3/3  invasive ductal cancer, lymphovascular invasion was seen focally.  Surgical margins were clear.  A total of five lymph nodes were removed at the time of sentinel node, one of which was involved with malignancy.  The patient has had an unremarkable postoperative course."  Her subsequent history is detailed below   HISTORY: OF LEFT (CONTRALATERAL) BREAST CANCER [name] has a history of right-sided breast cancer dating back 10 years. She missed her last 2 appointments with me as "no shows". The last time I saw this patient was in 2016.  More recently she noted a mass in her right breast. She underwent bilateral diagnostic mammography with tomography at the [facility] 12/07/2016 found a breast density to be category B. In the right breast there were only postsurgical changes and no suspicious findings. Ultrasound of the right breast showed an area consistent with a 9 fat necrosis in the 4:00 axis 5 cm from the nipple, measuring 1.8 cm. Biopsy of this area 12/27/2016 confirmed fat necrosis, with no evidence of malignancy ([id number]).  In the left breast however there was an irregular mass associated with architectural distortion, altogether measuring approximately 0.7 centimeters. Ultrasound of the left breast confirmed an irregular hypoechoic mass at the 9:30 o'clock axis 6 cm from the nipple measuring 0.8 cm. The left axilla was sonographically benign.  Biopsy of the left breast mass in question 12/07/2016 found ([id number] [id number]) an invasive ductal carcinoma, grade 2, estrogen receptor 100% positive, with strong staining intensity, progesterone receptor negative, with an MIB-1 of 10%, and no HER-2 amplification, the signals ratio being 1.24 and the number per cell 2.05.  This case was discussed at the multidisciplinary breast cancer conference 12/22/2016. At that point a preliminary plan was suggested: Breast conserving surgery with sentinel lymph node sampling followed by radiation versus consideration of  bilateral mastectomies, the concern of course being the patient's congestive heart failure. Anti-estrogens would follow at the completion of local treatment. A full genetics panel was also suggested.  The patient's subsequent history is as detailed below.   PAST MEDICAL HISTORY: Past Medical History:  Diagnosis Date  . Acne   . AICD (automatic cardioverter/defibrillator) present   . Anemia 1980's   [id number]  . Anginal pain (HCC)    sporatic , been going on for yrs,  . Anxiety   . Arthritis   . 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 Blood transfusion 1980's   1987 or 1988  . Breast calcifications    right breast  . Breast cancer (HCC)    right;  . 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 Cardiac MRI 05/2011 EF 32%. ICD implantation 10/2011 ([employer])  . 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 Conversion disorder   . 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 Depression   . Endometriosis   . Family history of colon cancer   . Family history of ovarian cancer   . Fibromyalgia   . GERD (gastroesophageal reflux disease)   . Headache   . Hepatomegaly   . History of stomach ulcers   . Hyperlipidemia   . Hypertension    c/b orthostatic hypotention  . ICD (implantable cardiac defibrillator) in place   . Insomnia   . Interstitial cystitis   . Left eye injury 12/2014  . 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   . Nonischemic cardiomyopathy (HCC)    related to chemo; EF 30% 05/2011  . Obesity   . Palpitation    normal sinus rhythm only on 21 day heart monitor  . Panic attacks   . Peripheral neuropathy    chemo- induced  . Pneumonia 2000's   "once"  . Seasonal allergies   . Seizures (HCC)    last seizure  last week: non elipsey seisure  . Type II diabetes mellitus (HCC)     PAST SURGICAL HISTORY: Past  Surgical History:  Procedure Laterality Date  . ABDOMINAL HYSTERECTOMY     partial  . BREAST EXCISIONAL BIOPSY     right benign 2012  . BREAST LUMPECTOMY  2008; 2013   right radiation  . BREAST LUMPECTOMY WITH RADIOACTIVE SEED AND SENTINEL LYMPH NODE BIOPSY Left 02/21/2017   Procedure: LEFT BREAST LUMPECTOMY WITH RADIOACTIVE SEED AND LEFT AXILLARY DEEP SENTINEL LYMPH NODE BIOPSY;  Surgeon: Claud Kelp, MD;  Location: [facility] OR;  Service: General;  Laterality: Left;  . CARDIAC DEFIBRILLATOR PLACEMENT  11/03/11  . CESAREAN SECTION    . CHOLECYSTECTOMY  ~ 2009  . COLONOSCOPY    . ICD REVISION N/A 02/21/2017   Procedure: ICD Revision;  Surgeon: [facility] Salvia, MD;  Location: [facility] INVASIVE CV LAB;  Service: Cardiovascular;  Laterality: N/A;  . IMPLANTABLE CARDIOVERTER DEFIBRILLATOR IMPLANT N/A 11/03/2011   Procedure: IMPLANTABLE CARDIOVERTER DEFIBRILLATOR IMPLANT;  Surgeon: Duke Salvia, MD; MDT   . LAPAROSCOPIC ENDOMETRIOSIS FULGURATION  1990's  . NASAL SINUS SURGERY    . PORT-A-CATH REMOVAL  2010?   left chest; placed in 2008  . TONSILLECTOMY  1980's  . TUBAL LIGATION  1990's    FAMILY HISTORY Family History  Problem Relation Age of Onset  . Heart disease Mother   . Arthritis Mother   . Hypertension Mother   . Stroke Mother   . Fibromyalgia Mother   . Coronary artery disease Unknown   . Heart attack Unknown   . Heart disease Maternal Uncle   . Colon cancer Paternal Uncle        dx over 62  . Heart disease Maternal Grandmother   . Heart attack Maternal Grandmother   . Ovarian cancer Paternal Grandmother   . Fibromyalgia Sister   . Ovarian cancer Paternal Aunt    the patient's father is still living, at age 52. He has a history of Crohn's disease. The patient's mother is living, age 49. She has a history of fibromyalgia. The patient has 3 half-brothers, one sister. The patient's father's mother had either ovarian cancer or cervical cancer. The patient was tested in June 2008 for  mutations in the BRCA1 and 2 genes. Complete sequencing found no mutations   GYNECOLOGIC HISTORY:  No LMP recorded. Patient has had a hysterectomy. menarche age 39, first live birth age 41. She is GX P1. She underwent simple hysterectomy, no salpingo-oophorectomy in 2007.   SOCIAL HISTORY:   she used to work for a [employer] in [employer]. Her husband of 23 years, Shirlee Limerick, in addition to his regular job is Education officer, environmental of their [employer]. There daughter,Maya, is currently 13. The patient attends the [facility] locally.     ADVANCED DIRECTIVES: Not in place. The patient's husband is her healthcare power of attorney   HEALTH MAINTENANCE: Social History  Substance Use Topics  . Smoking status: Never Smoker  . Smokeless tobacco: Never Used  . Alcohol use  Yes     Comment: social drinker      Colonoscopy:  PAP:  Bone density:  Lipid panel:  Allergies  Allergen Reactions  . Ace Inhibitors Other (See Comments)    hyperkalemia  . Reglan [Metoclopramide Hcl] Anxiety  . Adhesive [Tape] Itching and Rash    Specifically tegaderm, normal adhesive tape is ok    Current Outpatient Prescriptions  Medication Sig Dispense Refill  . acetaminophen (TYLENOL) 500 MG tablet Take 1,000 mg by mouth 2 (two) times daily as needed for moderate pain.    Marland Kitchen albuterol (PROVENTIL HFA;VENTOLIN HFA) 108 (90 Base) MCG/ACT inhaler Inhale 1-2 puffs into the lungs every 6 (six) hours as needed for wheezing or shortness of breath.     . allopurinol (ZYLOPRIM) 300 MG tablet Take 300 mg by mouth daily.  2  . baclofen (LIORESAL) 10 MG tablet TAKE 1 TABLET BY MOUTH 2 (TWO) TIMES DAILY AS NEEDED FOR MUSCLE SPASMS. 60 tablet 0  . BLACK CURRANT SEED OIL PO Take 15 mLs by mouth daily.     . Blood Glucose Monitoring Suppl (FREESTYLE FREEDOM LITE) w/Device KIT Check fasting and 2 hour PP 1 each 1  . carvedilol (COREG) 25 MG tablet Take 0.5 tablets (12.5 mg total) by mouth 2 (two) times daily with a meal. 60 tablet 6  .  Cholecalciferol (VITAMIN D3) 5000 UNITS CAPS Take 5,000 Units by mouth daily.     . Coenzyme Q10 (CO Q-10) 100 MG CAPS Take 100 mg by mouth daily.    . Cyanocobalamin 2500 MCG SUBL Place 2,500 mcg under the tongue daily.    . cyclobenzaprine (FLEXERIL) 10 MG tablet Take 10 mg by mouth 3 (three) times daily as needed for muscle spasms.    . digoxin (LANOXIN) 0.125 MG tablet TAKE 0.5 TABLETS (0.0625 MG TOTAL) BY MOUTH DAILY. 15 tablet 2  . divalproex (DEPAKOTE ER) 500 MG 24 hr tablet Take 1 tablet in a.m. and 2 tablets in p.m. 90 tablet 5  . DULoxetine (CYMBALTA) 30 MG capsule Take 1 capsule (30 mg total) by mouth 2 (two) times daily. 60 capsule 5  . ferrous sulfate 325 (65 FE) MG tablet Take 325 mg by mouth daily with breakfast.    . FREESTYLE UNISTICK II LANCETS MISC Check fasting blood sugar in the morning and check sugar after largest meal of the day. 100 each 12  . GLUCOSAMINE-CHONDROITIN PO Take 1,200 mg by mouth daily.    Marland Kitchen glucose blood (FREESTYLE TEST STRIPS) test strip To be filled with Freedom Lite test strips.   Check fasting blood sugar in the morning and check sugar after largest meal of the day. 100 each 12  . hydrOXYzine (ATARAX/VISTARIL) 25 MG tablet Take 25 mg by mouth at bedtime as needed for itching (DOESN'T TAKE WITH TRAZODONE).    Marland Kitchen ivabradine (CORLANOR) 5 MG TABS tablet Take 1 tablet (5 mg total) by mouth 2 (two) times daily with a meal. 180 tablet 3  . letrozole (FEMARA) 2.5 MG tablet Take 1 tablet (2.5 mg total) by mouth daily. 90 tablet 4  . loratadine (CLARITIN) 10 MG tablet Take 10 mg by mouth daily.    Marland Kitchen losartan (COZAAR) 25 MG tablet Take 1 tablet (25 mg total) by mouth daily. (Patient taking differently: Take 25 mg by mouth at bedtime. ) 90 tablet 3  . Menthol, Topical Analgesic, (ICY HOT EX) Apply 1 application topically daily as needed (muscle pain).    Marland Kitchen metolazone (ZAROXOLYN) 2.5 MG tablet Take  1 tablet (2.5 mg total) by mouth daily as needed (edema). 15 tablet 0  .  Milk Thistle 150 MG CAPS Take 450 mg by mouth daily.     . Multiple Vitamin (MULTIVITAMIN WITH MINERALS) TABS Take 1 tablet by mouth daily. Reported on 10/13/2015    . omeprazole (PRILOSEC) 40 MG capsule TAKE 1 CAPSULE (40 MG TOTAL) BY MOUTH 2 (TWO) TIMES DAILY BEFORE A MEAL. BREAKFAST AND SUPPER 60 capsule 9  . ondansetron (ZOFRAN) 4 MG tablet Take 1 tablet (4 mg total) by mouth every 8 (eight) hours as needed for nausea or vomiting. 20 tablet 3  . PATADAY 0.2 % SOLN Place 1 drop into both eyes daily as needed (allergies).     . pentosan polysulfate (ELMIRON) 100 MG capsule Take 100 mg by mouth 2 (two) times daily as needed (bladder spasms).     . potassium chloride SA (K-DUR,KLOR-CON) 20 MEQ tablet Take 2 tablets (40 mEq total) by mouth daily. 180 tablet 3  . RESTASIS 0.05 % ophthalmic emulsion     . Sodium Chloride-Sodium Bicarb (NETI POT SINUS WASH NA) Place 1 Dose into the nose daily as needed (congestion).    Marland Kitchen spironolactone (ALDACTONE) 25 MG tablet TAKE 0.5 TABLETS (12.5 MG TOTAL) BY MOUTH DAILY. 45 tablet 3  . torsemide (DEMADEX) 20 MG tablet TAKE 2 TABLETS (40 MG TOTAL) BY MOUTH DAILY. 60 tablet 6  . traZODone (DESYREL) 50 MG tablet Take 50 mg by mouth at bedtime as needed for sleep.     Marland Kitchen triamcinolone cream (KENALOG) 0.1 % Apply 1 application topically 2 (two) times daily as needed (eczema).    . Vitamin D, Ergocalciferol, (DRISDOL) 50000 units CAPS capsule Take 1 capsule (50,000 Units total) by mouth every 7 (seven) days. 12 capsule 3   No current facility-administered medications for this visit.     OBJECTIVE:  African-American woman who appears stated age  Vitals:   04/05/17 1625  BP: (!) 90/52  Pulse: 81  Temp: 97.8 F (36.6 C)  SpO2: 99%     Body mass index is 41.88 kg/m.    ECOG FS:2 - Symptomatic, <50% confined to bed  Sclerae unicteric, pupils round and equal Oropharynx clear and moist No cervical or supraclavicular adenopathy Lungs no rales or rhonchi Heart  regular rate and rhythm Abd soft, obese, nontender, positive bowel sounds MSK no focal spinal tenderness, no upper extremity lymphedema Neuro: nonfocal, well oriented, appropriate affect Breasts: The right breast has undergone remote lumpectomy and radiation. It is small and the contour is distorted. However there is no evidence of local recurrence. The left breast is status post recent lumpectomy. There is no dehiscence. Both axillae are benign.  LAB RESULTS:  CMP     Component Value Date/Time   NA 140 03/02/2017 1119   NA 134 (L) 02/01/2017 1435   K 3.9 03/02/2017 1119   K 4.0 02/01/2017 1435   CL 95 (L) 03/02/2017 1119   CO2 29 03/02/2017 1119   CO2 31 (H) 02/01/2017 1435   GLUCOSE 94 03/02/2017 1119   GLUCOSE 95 02/15/2017 1257   GLUCOSE 89 02/01/2017 1435   BUN 16 03/02/2017 1119   BUN 23.2 02/01/2017 1435   CREATININE 0.65 03/02/2017 1119   CREATININE 1.3 (H) 02/01/2017 1435   CALCIUM 9.0 03/02/2017 1119   CALCIUM 10.4 02/01/2017 1435   PROT 7.1 03/02/2017 1119   PROT 9.0 (H) 02/01/2017 1435   ALBUMIN 3.8 03/02/2017 1119   ALBUMIN 3.6 02/01/2017 1435  AST 22 03/02/2017 1119   AST 20 02/01/2017 1435   ALT 15 03/02/2017 1119   ALT 9 02/01/2017 1435   ALKPHOS 85 03/02/2017 1119   ALKPHOS 111 02/01/2017 1435   BILITOT 0.8 03/02/2017 1119   BILITOT 2.16 (H) 02/01/2017 1435   GFRNONAA 107 03/02/2017 1119   GFRAA 123 03/02/2017 1119    INo results found for: SPEP, UPEP  Lab Results  Component Value Date   WBC 12.5 (H) 03/02/2017   NEUTROABS 8.3 (H) 03/02/2017   HGB 12.6 03/02/2017   HCT 37.1 03/02/2017   MCV 92 03/02/2017   PLT 170 03/02/2017      Chemistry      Component Value Date/Time   NA 140 03/02/2017 1119   NA 134 (L) 02/01/2017 1435   K 3.9 03/02/2017 1119   K 4.0 02/01/2017 1435   CL 95 (L) 03/02/2017 1119   CO2 29 03/02/2017 1119   CO2 31 (H) 02/01/2017 1435   BUN 16 03/02/2017 1119   BUN 23.2 02/01/2017 1435   CREATININE 0.65 03/02/2017  1119   CREATININE 1.3 (H) 02/01/2017 1435   GLU 89 02/18/2015      Component Value Date/Time   CALCIUM 9.0 03/02/2017 1119   CALCIUM 10.4 02/01/2017 1435   ALKPHOS 85 03/02/2017 1119   ALKPHOS 111 02/01/2017 1435   AST 22 03/02/2017 1119   AST 20 02/01/2017 1435   ALT 15 03/02/2017 1119   ALT 9 02/01/2017 1435   BILITOT 0.8 03/02/2017 1119   BILITOT 2.16 (H) 02/01/2017 1435       Lab Results  Component Value Date   LABCA2 24 08/19/2011    No components found for: [id number]  No results for input(s): INR in the last 168 hours.  Urinalysis    Component Value Date/Time   COLORURINE AMBER (A) 02/18/2016 1741   APPEARANCEUR CLEAR 02/18/2016 1741   LABSPEC 1.018 02/18/2016 1741   PHURINE 5.5 02/18/2016 1741   GLUCOSEU NEGATIVE 02/18/2016 1741   HGBUR NEGATIVE 02/18/2016 1741   BILIRUBINUR SMALL (A) 02/18/2016 1741   KETONESUR 15 (A) 02/18/2016 1741   PROTEINUR NEGATIVE 02/18/2016 1741   UROBILINOGEN 0.2 05/02/2008 1222   NITRITE NEGATIVE 02/18/2016 1741   LEUKOCYTESUR NEGATIVE 02/18/2016 1741    STUDIES: Outside labs reviewed with the patient; pathology also reviewed with the patient and her husband  ASSESSMENT: 46 y.o.  BRCA negative [place] woman   RIGHT BREAST CANCER:  (1) status post right lumpectomy and sentinel lymph node sampling 08/22/2006 for a pT2 pN1, stage IIB invasive ductal carcinoma, grade 3, estrogen receptor 99% positive, progesterone receptor 98% positive, with an MIB-1 of 16% and no HER-2 amplification   (a) biopsy of a palpable area in the right breast 4:00 position 12/27/2016 showed only fat necrosis  (2) completion axillary dissection 09/07/2006 showed no additional positive lymph nodes (total of 17 lymph nodes sampled, one positive)  (3) adjuvant chemotherapy followed ECOG 10/12/2001, arm D   (a) started 10/21/2006 and consisted of cyclophosphamide and doxorubicin given in dose dense fashion 4, completed 12/02/2006 (total doxorubicin dose  240 mg/m), followed by   (b) weekly paclitaxel 12 together with either bevacizumab or placebo given every 3 weeks, completed 03/03/2007    (c) bevacizumab was to have been continued, but protocol-mandated echocardiogram 03/06/2007 showed the patient's previously normal left ventricular ejection fraction to have dropped to 35%.  (4)  completed radiation treatment to the right breast 05/17/2007-- 5040 cGy plus a 1260 cGy boost  (5) received  letrozole between December 2008 and March 2013  LEFT BREAST CANCER:  (6) status post left breast upper inner quadrant biopsy 12/07/2016 for a clinical T1b N0, stage IA invasive ductal carcinoma, grade 2, estrogen receptor positive, progesterone receptor negative, with no HER-2 amplification, and an MIB-1 of 10%.  (7) left lumpectomy and sentinel lymph node sampling 02/21/2017 showed a pT1b pN0, stage IA invasive ductal carcinoma, grade 1, with negative margins.  (a) biopsy of a of a suspicious area in the right breast 12/27/2016 showed fat necrosis.  (8) genetics testing 02/02/2017 through the Common Hereditary Gene Panel offered by [facility] found no deleterious mutations in APC, ATM, AXIN2, BARD1, BMPR1A, BRCA1, BRCA2, BRIP1, CDH1, CDKN2A (p14ARF), CDKN2A (p16INK4a), CHEK2, CTNNA1, DICER1, EPCAM (Deletion/duplication testing only), GREM1 (promoter region deletion/duplication testing only), KIT, MEN1, MLH1, MSH2, MSH3, MSH6, MUTYH, NBN, NF1, NHTL1, PALB2, PDGFRA, PMS2, POLD1, POLE, PTEN, RAD50, RAD51C, RAD51D, SDHB, SDHC, SDHD, SMAD4, SMARCA4. STK11, TP53, TSC1, TSC2, and VHL.  The following genes were evaluated for sequence changes only: SDHA and HOXB13 c.251G>A variant only.    (a) a variant of uncertain significance, ATM c.5168T>C (p.Val1723Ala) was identified  (9) adjuvant radiation to follow   (10) letrozole started 02/01/2017  (a) received 1 dose of fulvestrant 01/04/2017--unable to tolerate  ADDITIONAL CONCERNS:  (a) severe cardiomyopathy with most  recent echocardiogram 06/24/2014 showing an ejection fraction  20 -25%  (a) ICD implanted May 2013  (b) fibromyalgia, with chronic pain and some residual grade 1 neuropathy from chemotherapy   (8c sleep apnea documented through polysomnography 07/24/2013  (d) conversion disorder with no evidence of myotonia or myotonic dystrophy on exam at [facility] (under Azzie Almas MD 11/15/2013)   (e) all blood draws to be from left arm   PLAN: I spent approximately 30 minutes with Monte with most of that time spent discussing her complex problems. From the breast cancer point of view we are reviewed her pathology and she understands she had a very small tumor which is not aggressive looking, which is not fast-growing, and which should respond to anti-estrogens. She has been in remission since her surgery. She has an excellent prognosis overall  She does need to complete her local treatment with radiation and this is being set up through Dr. Karoline Caldwell. She is continuing on letrozole and tolerating that well. That will be her only systemic treatment this time.  She is very emotional. I think she would benefit from venlafaxine. We're going to start at a very low dose, 37.5 mg daily, and she will take that for 3 or 4 weeks. If she finds she tolerates it well she will let me know and we will increase her dose to 75 mg daily.  I also think she would benefit from the [place] program and from tai chi and I gave her information on those programs.  We have placed a referral to the pain clinic and she tells me she has already been contacted and expects to be seen sometime in the next month or so. She is also trying to obtain an appointment with Dr. Haywood Lasso in psychiatry for further evaluation and treatment  Since she had 17 lymph nodes removed from the right axilla and only 2 from the left, left arm should be used preferentially for blood pressure and blood draws.  She is going to return to see me early December.  She knows to call for any other issues that may develop before that visit.  Quill Grinder, Valentino Hue, MD  04/05/17 4:39 PM Medical  Oncology and Hematology [facility] [address] [place], [place] [address] Tel. [phone]    Fax. [phone]  This document serves as a record of services personally performed by Lowella Dell, MD. It was created on her behalf by Diona Browner, a trained medical scribe. The creation of this record is based on the scribe's personal observations and the provider's statements to them. This document has been checked and approved by the attending provider.

## 2017-04-05 NOTE — Telephone Encounter (Signed)
New message    Anderson Malta from Dr. Isidore Moos office is calling to find out if fax was received about pt device. She said pt is to start radiation next week. Please call.

## 2017-04-07 ENCOUNTER — Ambulatory Visit: Payer: Medicare Other | Admitting: Adult Health

## 2017-04-11 ENCOUNTER — Telehealth: Payer: Self-pay | Admitting: Oncology

## 2017-04-11 NOTE — Telephone Encounter (Signed)
Attempted to leave message for patient regarding appts added per 10/23 los. The phone call messed up - so I am going to send her a confirmation letter in the mail.

## 2017-04-15 ENCOUNTER — Other Ambulatory Visit: Payer: Self-pay | Admitting: Internal Medicine

## 2017-04-18 NOTE — Telephone Encounter (Signed)
error 

## 2017-04-22 ENCOUNTER — Ambulatory Visit: Payer: Self-pay | Admitting: Family Medicine

## 2017-04-27 ENCOUNTER — Ambulatory Visit
Admission: RE | Admit: 2017-04-27 | Discharge: 2017-04-27 | Disposition: A | Discharge status: Home / Self Care | Payer: Medicare Other | Source: Ambulatory Visit | Attending: Radiation Oncology | Admitting: Radiation Oncology

## 2017-04-27 DIAGNOSIS — Z170 Estrogen receptor positive status [ER+]: Principal | ICD-10-CM

## 2017-04-27 DIAGNOSIS — C50212 Malignant neoplasm of upper-inner quadrant of left female breast: Secondary | ICD-10-CM | POA: Diagnosis not present

## 2017-04-27 DIAGNOSIS — J449 Chronic obstructive pulmonary disease, unspecified: Secondary | ICD-10-CM | POA: Diagnosis not present

## 2017-04-27 DIAGNOSIS — F419 Anxiety disorder, unspecified: Secondary | ICD-10-CM | POA: Diagnosis not present

## 2017-04-27 DIAGNOSIS — I509 Heart failure, unspecified: Secondary | ICD-10-CM | POA: Diagnosis not present

## 2017-04-27 DIAGNOSIS — I110 Hypertensive heart disease with heart failure: Secondary | ICD-10-CM | POA: Diagnosis not present

## 2017-04-27 NOTE — Progress Notes (Addendum)
Radiation Oncology         [phone]) [phone] ________________________________  Name: [name] MRN: [id number]  Date: 04/27/2017  DOB: August 31, 1970  SIMULATION AND TREATMENT PLANNING NOTE/Special treatment procedure     Outpatient  DIAGNOSIS:     ICD-10-CM   1. Malignant neoplasm of upper-inner quadrant of left breast in female, estrogen receptor positive ([place]) C50.212    Z17.0     NARRATIVE:  The patient was brought to the [facility].  Identity was confirmed.  All relevant records and images related to the planned course of therapy were reviewed.  The patient freely provided informed written consent to proceed with treatment after reviewing the details related to the planned course of therapy. The consent form was witnessed and verified by the simulation staff.    Then, the patient was set-up in a stable reproducible supine position for radiation therapy with her ipsilateral arm over her head, and her upper body secured in a custom-made Vac-lok device.  CT images were obtained.  Surface markings were placed.  The CT images were loaded into the planning software.    Special treatment procedure:  Special treatment procedure was performed today due to the extra time and effort required by myself to plan and prepare this patient for deep inspiration breath hold technique.  I have determined cardiac sparing to be of benefit to this patient to prevent long term cardiac damage due to radiation of the heart.  Bellows were placed on the patient's abdomen. To facilitate cardiac sparing, the patient was coached by the radiation therapists on breath hold techniques and breathing practice was performed. Practice waveforms were obtained. The patient was then scanned while maintaining breath hold in the treatment position.  This image was then transferred over to the imaging specialist. The imaging specialist then created a fusion of the free breathing and breath hold scans using the chest  wall as the stable structure. I personally reviewed the fusion in axial, coronal and sagittal image planes.  Excellent cardiac sparing was obtained.  I felt the patient is an appropriate candidate for breath hold and the patient will be treated as such.  The image fusion was then reviewed with the patient to reinforce the necessity of reproducible breath hold.  TREATMENT PLANNING NOTE: Treatment planning then occurred.  The radiation prescription was entered and confirmed.     A total of 3 medically necessary complex treatment devices were fabricated and supervised by me: 2 fields with MLCs for custom blocks to protect heart, and lungs;  and, a Vac-lok. MORE COMPLEX DEVICES MAY BE MADE IN DOSIMETRY FOR FIELD IN FIELD BEAMS FOR DOSE HOMOGENEITY.  I have requested : 3D Simulation which is medically necessary to give adequate dose to at risk tissues while sparing lungs and heart.  I have requested a DVH of the following structures: lungs, heart, left lumpectomy cavity.    The patient will receive 40.05 Gy in 15 fractions to the left breast with 2 tangential fields.   This will be followed by a boost.  Optical Surface Tracking Plan:  Since intensity modulated radiotherapy (IMRT) and 3D conformal radiation treatment methods are predicated on accurate and precise positioning for treatment, intrafraction motion monitoring is medically necessary to ensure accurate and safe treatment delivery. The ability to quantify intrafraction motion without excessive ionizing radiation dose can only be performed with optical surface tracking. Accordingly, surface imaging offers the opportunity to obtain 3D measurements of patient position throughout IMRT and 3D treatments without excessive  radiation exposure. I am ordering optical surface tracking for this patient's upcoming course of radiotherapy.  ________________________________   Reference:  Ursula Alert, J, et al. Surface imaging-based analysis of  intrafraction motion for breast radiotherapy patients.Journal of [place], n. 6, nov. 2014. ISSN [id number].  Available at: <http://www.jacmp.org/index.php/jacmp/article/view/4957>.   Special Treatment Procedure Note: The patient received prior radiotherapy close to hercurrent fields. There could be some overlap of radiation dose.  Prior regional radiotherapy increases the risk of side effects from treatment. I have considered this in the treatment planning process and have aimed to minimize tissue overlap.  This increases the complexity of this patient's treatment and therefore this constitutes a special treatment procedure.   -----------------------------------  Eppie Gibson, MD

## 2017-04-28 ENCOUNTER — Other Ambulatory Visit: Payer: Self-pay

## 2017-04-28 ENCOUNTER — Telehealth: Payer: Self-pay

## 2017-04-28 ENCOUNTER — Encounter (HOSPITAL_COMMUNITY): Payer: Self-pay | Admitting: Internal Medicine

## 2017-04-28 ENCOUNTER — Ambulatory Visit (HOSPITAL_COMMUNITY)
Admission: RE | Admit: 2017-04-28 | Discharge: 2017-04-28 | Disposition: A | Discharge status: Home / Self Care | Payer: Medicare Other | Source: Ambulatory Visit | Attending: Internal Medicine | Admitting: Internal Medicine

## 2017-04-28 VITALS — BP 111/61 | HR 86 | Wt 264.8 lb

## 2017-04-28 DIAGNOSIS — Z888 Allergy status to other drugs, medicaments and biological substances status: Secondary | ICD-10-CM | POA: Diagnosis not present

## 2017-04-28 DIAGNOSIS — Z8719 Personal history of other diseases of the digestive system: Secondary | ICD-10-CM | POA: Insufficient documentation

## 2017-04-28 DIAGNOSIS — I5022 Chronic systolic (congestive) heart failure: Secondary | ICD-10-CM | POA: Insufficient documentation

## 2017-04-28 DIAGNOSIS — F449 Dissociative and conversion disorder, unspecified: Secondary | ICD-10-CM | POA: Insufficient documentation

## 2017-04-28 DIAGNOSIS — C50919 Malignant neoplasm of unspecified site of unspecified female breast: Secondary | ICD-10-CM | POA: Diagnosis not present

## 2017-04-28 DIAGNOSIS — M109 Gout, unspecified: Secondary | ICD-10-CM | POA: Diagnosis not present

## 2017-04-28 DIAGNOSIS — Z91048 Other nonmedicinal substance allergy status: Secondary | ICD-10-CM | POA: Insufficient documentation

## 2017-04-28 DIAGNOSIS — F419 Anxiety disorder, unspecified: Secondary | ICD-10-CM | POA: Diagnosis not present

## 2017-04-28 DIAGNOSIS — E785 Hyperlipidemia, unspecified: Secondary | ICD-10-CM | POA: Diagnosis not present

## 2017-04-28 DIAGNOSIS — Z79899 Other long term (current) drug therapy: Secondary | ICD-10-CM | POA: Diagnosis not present

## 2017-04-28 DIAGNOSIS — Z800 Family history of malignant neoplasm of digestive organs: Secondary | ICD-10-CM | POA: Insufficient documentation

## 2017-04-28 DIAGNOSIS — I110 Hypertensive heart disease with heart failure: Secondary | ICD-10-CM | POA: Diagnosis not present

## 2017-04-28 DIAGNOSIS — Z95810 Presence of automatic (implantable) cardiac defibrillator: Secondary | ICD-10-CM | POA: Diagnosis not present

## 2017-04-28 DIAGNOSIS — E1140 Type 2 diabetes mellitus with diabetic neuropathy, unspecified: Secondary | ICD-10-CM | POA: Diagnosis not present

## 2017-04-28 DIAGNOSIS — M797 Fibromyalgia: Secondary | ICD-10-CM | POA: Diagnosis not present

## 2017-04-28 DIAGNOSIS — K219 Gastro-esophageal reflux disease without esophagitis: Secondary | ICD-10-CM | POA: Insufficient documentation

## 2017-04-28 DIAGNOSIS — G4700 Insomnia, unspecified: Secondary | ICD-10-CM | POA: Diagnosis not present

## 2017-04-28 DIAGNOSIS — C50912 Malignant neoplasm of unspecified site of left female breast: Secondary | ICD-10-CM

## 2017-04-28 DIAGNOSIS — J449 Chronic obstructive pulmonary disease, unspecified: Secondary | ICD-10-CM | POA: Diagnosis not present

## 2017-04-28 DIAGNOSIS — F329 Major depressive disorder, single episode, unspecified: Secondary | ICD-10-CM | POA: Diagnosis not present

## 2017-04-28 DIAGNOSIS — I427 Cardiomyopathy due to drug and external agent: Secondary | ICD-10-CM | POA: Insufficient documentation

## 2017-04-28 MED ORDER — SPIRONOLACTONE 25 MG PO TABS: 25.0000 mg | ORAL_TABLET | Freq: Every day | ORAL | 3 refills | Status: AC

## 2017-04-28 MED ORDER — IVABRADINE HCL 5 MG PO TABS: 7.5000 mg | ORAL_TABLET | Freq: Two times a day (BID) | ORAL | 3 refills | Status: DC

## 2017-04-28 NOTE — Patient Instructions (Signed)
INCREASE Spironolactone to 25mg  daily.  INCREASE Corlanor to 7.5mg  twice daily.  Labs in 10days  Follow up in 3 months

## 2017-04-28 NOTE — Telephone Encounter (Signed)
[name] begins radiation treatments on 05/09/17 at 4:50 pm. She has an ICD device and per the pacemaker form received from Dr. Caryl Comes, he would like her monitored after her 1st radiation treatment. I called and spoke to Dannial Monarch with [employer] and informed her of the need to be here at the radiation treatment time to monitor [name]. She voiced her understanding and reports she will be here at that time to monitor her.

## 2017-04-28 NOTE — Addendum Note (Signed)
Encounter addended by: Jolaine Artist, MD on: 04/28/2017 3:07 PM  Actions taken: Charge Capture section accepted

## 2017-04-28 NOTE — Progress Notes (Addendum)
Patient ID: [name], female   DOB: 1971-04-30, 46 y.o.   MRN: [id number]  ADVANCED HF CLINIC NOTE  Patient ID: [name], female   DOB: 06/11/1971, 46 y.o.   MRN: [id number]  Primary Cardiologist:  Dr. Glori Bickers Neurlogist: Dohmeir   PCP: Dr. Margaretha Sheffield Griffin/Noel Redmon, PAC   History of Present Illness: [name] is a 46 y.o. female with a history of morbid obesity, depression, fibromyalgia, congestive heart failure secondary to nonischemic cardiomyopathy (EF 20-25%) related to her chemotherapy for breast cancer. Last chemo 2008. Evaluated by neuro for stuttering.  Felt to have a conversion disorder. Confirmed by [name].   She presented to [name] on 11/26/14 with near syncope and dyspnea.With + orthostatics in ER (SBP 104 to 87 from lying to sitting) She also had volume overload with acute on chronic CHF. BNP 1093.0. Troponins normal. Had 7 lb  gain over 2 days with dietary indiscretion of sodium.Admitted for IV diuretic therapy and monitoring of BP. Coreg reduced 12.5 mg BID. Symptoms improved and good diuresis with IV lasix therapy. I/Os net negative 6 L total. Was transitioned back to PO Torsemide once euvolemic. Discharge weight was 249 lb.  Evaluated by pulmonary and Dr Elsworth Soho told he saw no evidence of asthma.    She presents today for regular follow up. At last visit had recently been diagnosed with recurrent Breast CA. Lumpectomy + with clean margins. Plan to start radiation 05/09/17. Denies SOB with ADLs. SOB with mild to moderate exertion when her fluid is elevated.  Her primary complaint is fatigue and pain from her gout and FM.  Trying to watch diet, but no specific plan.   ICD interrogation done personally in clinic: No VT/VF. Mild up trend in fluid index, thoracic impedence below threshold.  Activity level ~ 1 hours daily.   Studies:  CPX 4/18   FVC 1.82 (54%)    FEV1 1.15 (42%)     FEV1/FVC 63 (77%)     MVV 58 (54%)  Resting HR: 98 Peak HR: 126   (72% age predicted max HR) BP rest: 86/64 BP peak: 76/60 Peak VO2: 6.4 (38% predicted peak VO2) VE/VCO2 slope: 50 OUES: 0.84 Peak RER: 1.11 Ventilatory Threshold: 5.4 (32% predicted or measured peak VO2) VE/MVV: 71% O2pulse: 6  (55% predicted O2pulse)   CPX Feb 2010. Peak VO2 was 14.5 which was 71% of predicted. When corrected for body weight the VO2 was 20.7. The slope was 27. RER 1.20. O2 pulse 73%. Overall this was felt to be only a very mild functional limitation due to her obesity and mild circulatory limitation.  CPX 2/11: pVO2 13.0 (72%) correct for ideal wt 51m/kg/min RER 1.06 (submax) slope 28.2 O2 pulse 91% - felt no signifcant cardiac limitation. + deconditioning.   Echo 04/2009  EF back down to 25%.  RHC normal. Echo 07/2009 showed EF 50%  Echo 06/2010 EF 40%. So we did MUGA EF 58% Echo 12/2010 40-45% Echo 05/2011 EF 30%. Grade 2 diastolic dysfunction Cath (12/28/10) was set up and demonstrated EF 20-25% and normal coronary arteries.  RA  4, RV 35/11, PA  28/8 (17), PCWP 8. Fick 5.2/2.4 PVR 1.7 Woods. Fick 5.2/2.4 Aortic saturation was 97%.  PA saturation was 67% and 68%. ECHO 09/03/13 EF 20-25%  ECHO 06/24/14 EF 20-25% Cardiac MRI on 07/19/11: Moderate to severe LVE, Diffuse hypokinesis. EF 32%, Mild LAE ECHO 10/13/15 EF 20-25% Mild to moderate RV dysfunction   Labs  09/03/13 k 3.3 Creatinine 0.86 Pro BNP 998 Dig  level 0.7 05/17/14 K 4.7 Creatinine 0.9 pBNP 1007 digoxin 1.2 11/29/14 K 4.2 Creatinine 1.08 Dig 0.3 01/19/16  K 3.7 creatinine 0.90  SH: lived with her husband. Does not drive. Does not drink alcohol or smoke  Review of systems complete and found to be negative unless listed in HPI.    Past Medical History:  Diagnosis Date  . Acne   . AICD (automatic cardioverter/defibrillator) present   . Anemia 1980's   [id number]  . Anginal pain ([name])    sporatic , been going on for yrs,  . Anxiety   . Arthritis   . 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 Blood transfusion 1980's   1987 or  1988  . Breast calcifications    right breast  . Breast cancer ([name])    right;  . CHF (congestive heart failure) ([name])    due to non-ischemic cardiomyopathy, thought to be chemotherapy induced;  cath 7/12: normal cors, EF 20-25%. Cardiac MRI 05/2011 EF 32%. ICD implantation 10/2011 ([employer])  . 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 Conversion disorder   . 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 Depression   . Endometriosis   . Family history of colon cancer   . Family history of ovarian cancer   . Fibromyalgia   . GERD (gastroesophageal reflux disease)   . Headache   . Hepatomegaly   . History of stomach ulcers   . Hyperlipidemia   . Hypertension    c/b orthostatic hypotention  . ICD (implantable cardiac defibrillator) in place   . Insomnia   . Interstitial cystitis   . Left eye injury 12/2014  . 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   . Nonischemic cardiomyopathy ([name])    related to chemo; EF 30% 05/2011  . Obesity   . Palpitation    normal sinus rhythm only on 21 day heart monitor  . Panic attacks   . Peripheral neuropathy    chemo- induced  . Pneumonia 2000's   "once"  . Seasonal allergies   . Seizures ([name])    last seizure  last week: non elipsey seisure  . Type II diabetes mellitus ([name])    Current Outpatient Medications on File Prior to Encounter  Medication Sig Dispense Refill  . acetaminophen (TYLENOL) 500 MG tablet Take 1,000 mg by mouth 2 (two) times daily as needed for moderate pain.    Marland Kitchen albuterol (PROVENTIL HFA;VENTOLIN HFA) 108 (90 Base) MCG/ACT inhaler Inhale 1-2 puffs into the lungs every 6 (six) hours as needed for wheezing or shortness of breath.     . allopurinol (ZYLOPRIM) 300 MG tablet Take 300 mg by mouth daily.  2  . baclofen (LIORESAL) 10 MG tablet TAKE 1 TABLET BY MOUTH 2 (TWO) TIMES DAILY AS NEEDED FOR MUSCLE SPASMS. 60 tablet 0  . BLACK CURRANT SEED OIL PO  Take 15 mLs by mouth daily.     . Blood Glucose Monitoring Suppl (FREESTYLE FREEDOM LITE) w/Device KIT Check fasting and 2 hour PP 1 each 1  . carvedilol (COREG) 25 MG tablet Take 0.5 tablets (12.5 mg total) by mouth 2 (two) times daily with a meal. 60 tablet 6  . Cholecalciferol (VITAMIN D3) 5000 UNITS CAPS Take 5,000 Units by mouth daily.     . Coenzyme Q10 (CO Q-10) 100 MG CAPS Take 100 mg by mouth daily.    . Cyanocobalamin 2500 MCG SUBL Place 2,500 mcg under the tongue daily.    . cyclobenzaprine (FLEXERIL)  10 MG tablet Take 10 mg by mouth 3 (three) times daily as needed for muscle spasms.    . digoxin (LANOXIN) 0.125 MG tablet TAKE 0.5 TABLETS (0.0625 MG TOTAL) BY MOUTH DAILY. 15 tablet 2  . divalproex (DEPAKOTE ER) 500 MG 24 hr tablet Take 1 tablet in a.m. and 2 tablets in p.m. 90 tablet 5  . DULoxetine (CYMBALTA) 30 MG capsule Take 1 capsule (30 mg total) by mouth 2 (two) times daily. 60 capsule 5  . ferrous sulfate 325 (65 FE) MG tablet Take 325 mg by mouth daily with breakfast.    . FREESTYLE UNISTICK II LANCETS MISC Check fasting blood sugar in the morning and check sugar after largest meal of the day. 100 each 12  . GLUCOSAMINE-CHONDROITIN PO Take 1,200 mg by mouth daily.    Marland Kitchen glucose blood (FREESTYLE TEST STRIPS) test strip To be filled with Freedom Lite test strips.   Check fasting blood sugar in the morning and check sugar after largest meal of the day. 100 each 12  . hydrOXYzine (ATARAX/VISTARIL) 25 MG tablet Take 25 mg by mouth at bedtime as needed for itching (DOESN'T TAKE WITH TRAZODONE).    Marland Kitchen ivabradine (CORLANOR) 5 MG TABS tablet Take 1 tablet (5 mg total) by mouth 2 (two) times daily with a meal. 180 tablet 3  . letrozole (FEMARA) 2.5 MG tablet Take 1 tablet (2.5 mg total) by mouth daily. 90 tablet 4  . loratadine (CLARITIN) 10 MG tablet Take 10 mg by mouth daily.    Marland Kitchen losartan (COZAAR) 25 MG tablet Take 1 tablet (25 mg total) by mouth daily. 90 tablet 3  . Menthol, Topical  Analgesic, (ICY HOT EX) Apply 1 application topically daily as needed (muscle pain).    Marland Kitchen metolazone (ZAROXOLYN) 2.5 MG tablet Take 1 tablet (2.5 mg total) by mouth daily as needed (edema). 15 tablet 0  . Milk Thistle 150 MG CAPS Take 450 mg by mouth daily.     . Multiple Vitamin (MULTIVITAMIN WITH MINERALS) TABS Take 1 tablet by mouth daily. Reported on 10/13/2015    . omeprazole (PRILOSEC) 40 MG capsule TAKE 1 CAPSULE (40 MG TOTAL) BY MOUTH 2 (TWO) TIMES DAILY BEFORE A MEAL. BREAKFAST AND SUPPER 60 capsule 9  . ondansetron (ZOFRAN) 4 MG tablet Take 1 tablet (4 mg total) by mouth every 8 (eight) hours as needed for nausea or vomiting. 20 tablet 3  . PATADAY 0.2 % SOLN Place 1 drop into both eyes daily as needed (allergies).     . pentosan polysulfate (ELMIRON) 100 MG capsule Take 100 mg by mouth 2 (two) times daily as needed (bladder spasms).     . potassium chloride Roy (K-DUR,KLOR-CON) 20 MEQ tablet Take 2 tablets (40 mEq total) by mouth daily. 180 tablet 3  . RESTASIS 0.05 % ophthalmic emulsion     . Sodium Chloride-Sodium Bicarb (NETI POT SINUS WASH NA) Place 1 Dose into the nose daily as needed (congestion).    Marland Kitchen spironolactone (ALDACTONE) 25 MG tablet TAKE 0.5 TABLETS (12.5 MG TOTAL) BY MOUTH DAILY. 45 tablet 3  . torsemide (DEMADEX) 20 MG tablet TAKE 2 TABLETS (40 MG TOTAL) BY MOUTH DAILY. 60 tablet 6  . traZODone (DESYREL) 50 MG tablet Take 50 mg by mouth at bedtime as needed for sleep.     Marland Kitchen triamcinolone cream (KENALOG) 0.1 % Apply 1 application topically 2 (two) times daily as needed (eczema).    . venlafaxine XR (EFFEXOR-XR) 37.5 MG 24 hr capsule Take  1 capsule (37.5 mg total) by mouth daily with breakfast. 30 capsule 6  . Vitamin D, Ergocalciferol, (DRISDOL) 50000 units CAPS capsule Take 1 capsule (50,000 Units total) by mouth every 7 (seven) days. 12 capsule 3   No current facility-administered medications on file prior to encounter.      Allergies  Allergen Reactions  . Ace  Inhibitors Other (See Comments)    hyperkalemia  . Reglan [Metoclopramide Hcl] Anxiety  . Adhesive [Tape] Itching and Rash    Specifically tegaderm, normal adhesive tape is ok   Review of systems complete and found to be negative unless listed in HPI.    Vital Signs: Vitals:   04/28/17 1156  BP: 111/61  Pulse: 86  SpO2: 100%  Weight: 264 lb 12 oz (120.1 kg)   Wt Readings from Last 3 Encounters:  04/28/17 264 lb 12 oz (120.1 kg)  04/05/17 267 lb 6.4 oz (121.3 kg)  04/01/17 261 lb (118.4 kg)   Body mass index is 41.47 kg/m.  PHYSICAL EXAM: General: Fatigued and obese. No resp difficulty. HEENT: Normal Neck: Supple. JVP difficult to see, but does not appear elevated. Carotids 2+ bilat; no bruits. No thyromegaly or nodule noted. Cor: PMI nondisplaced. RRR, 2/6 TR no S3. Lungs: CTAB, normal effort. Abdomen: Obese Soft, non-tender, non-distended, no HSM. No bruits or masses. +BS  Extremities: No cyanosis, clubbing, or rash. Trace ankle edema.  Neuro: Alert & orientedx3, cranial nerves grossly intact. moves all 4 extremities w/o difficulty. Affect flat but appropriate.   ASSESSMENT AND PLAN: 1. Chronic systolic CHF: Nonischemic cardiomyopathy.  [employer] ICD. Echo 5/17 EF 20-25% with moderately dilated LV.   - CPX from 4/18 shows severe HF limitation - significantly decreased from previous - but was done on a day when she had some fluid on board. Given BMI, recurrent breast cancer and comorbidities she is not ideal candidate for advanced therapies. - Chronic NYHA IIIB. Volume status OK on exam but mildly elevated by Optivol.  - Continue torsemide 40 mg daily. Can use metolazone as needed.  - Continue coreg 12.5 mg BID. Dose previously reduced due to orthostasis.  - Continue dig 0.0625 mg daily. - Increase spiro to 25 mg daily. BMET today and will need repeat in 10 days.  - Continue losartan '25mg'$  daily  - Increase ivabradine to 7.5 mg BID.  - Reinforced fluid restriction to < 2  L daily, sodium restriction to less than 2000 mg daily, and the importance of daily weights.   2. Recurrent Left Breast cancer - Please see Dr. Jana Hakim note on 04/05/17 for further.  - Got 1 dose of fulvestrant but couldn't tolerate. Now on Letrozole.  - Plan for radiation 05/09/17- Discussed at length  3. Conversion disorder: - Followed by neurology.  - Started on velafaxine 04/05/2017 4. Fibromyalgia/gout - Follows with Dr. Patrecia Pour  5. Morbid obesity: - Very limited in her activity by her FM and gout.  - Needs to lose weight. Encouraged increased activity as tolerated and limiting portion sizes.   Shirley Friar, PA-C  12:10 PM   Patient seen and examined with the above-signed Advanced Practice Provider and/or Housestaff. I personally reviewed laboratory data, imaging studies and relevant notes. I independently examined the patient and formulated the important aspects of the plan. I have edited the note to reflect any of my changes or salient points. I have personally discussed the plan with the patient and/or family.  S/p lumpectomy. About to start XRT.   Remains stable from a  HF. NYHA III. Volume status stable. Will increase corlanor to 7.5 bid and increase spiro to 25 dail.   ICD interrogated personally. No VT/AV. Volume status trending up slightly.   Glori Bickers, MD  3:04 PM

## 2017-04-29 DIAGNOSIS — M2550 Pain in unspecified joint: Secondary | ICD-10-CM | POA: Diagnosis not present

## 2017-04-29 DIAGNOSIS — M25569 Pain in unspecified knee: Secondary | ICD-10-CM | POA: Diagnosis not present

## 2017-04-29 DIAGNOSIS — M797 Fibromyalgia: Secondary | ICD-10-CM | POA: Diagnosis not present

## 2017-04-29 DIAGNOSIS — M109 Gout, unspecified: Secondary | ICD-10-CM | POA: Diagnosis not present

## 2017-04-29 DIAGNOSIS — M79643 Pain in unspecified hand: Secondary | ICD-10-CM | POA: Diagnosis not present

## 2017-04-29 DIAGNOSIS — M549 Dorsalgia, unspecified: Secondary | ICD-10-CM | POA: Diagnosis not present

## 2017-04-29 DIAGNOSIS — M7989 Other specified soft tissue disorders: Secondary | ICD-10-CM | POA: Diagnosis not present

## 2017-04-29 DIAGNOSIS — M79673 Pain in unspecified foot: Secondary | ICD-10-CM | POA: Diagnosis not present

## 2017-04-29 DIAGNOSIS — R768 Other specified abnormal immunological findings in serum: Secondary | ICD-10-CM | POA: Diagnosis not present

## 2017-05-01 DIAGNOSIS — I110 Hypertensive heart disease with heart failure: Secondary | ICD-10-CM | POA: Diagnosis not present

## 2017-05-01 DIAGNOSIS — Z170 Estrogen receptor positive status [ER+]: Secondary | ICD-10-CM | POA: Diagnosis not present

## 2017-05-01 DIAGNOSIS — F419 Anxiety disorder, unspecified: Secondary | ICD-10-CM | POA: Diagnosis not present

## 2017-05-01 DIAGNOSIS — C50212 Malignant neoplasm of upper-inner quadrant of left female breast: Secondary | ICD-10-CM | POA: Diagnosis not present

## 2017-05-01 DIAGNOSIS — J449 Chronic obstructive pulmonary disease, unspecified: Secondary | ICD-10-CM | POA: Diagnosis not present

## 2017-05-01 DIAGNOSIS — I509 Heart failure, unspecified: Secondary | ICD-10-CM | POA: Diagnosis not present

## 2017-05-09 ENCOUNTER — Ambulatory Visit
Admission: RE | Admit: 2017-05-09 | Discharge: 2017-05-09 | Disposition: A | Discharge status: Home / Self Care | Payer: Medicare Other | Source: Ambulatory Visit | Attending: Radiation Oncology | Admitting: Radiation Oncology

## 2017-05-09 ENCOUNTER — Ambulatory Visit (HOSPITAL_COMMUNITY)
Admission: RE | Admit: 2017-05-09 | Discharge: 2017-05-09 | Disposition: A | Discharge status: Home / Self Care | Payer: Medicare Other | Source: Ambulatory Visit | Attending: Cardiology | Admitting: Cardiology

## 2017-05-09 ENCOUNTER — Other Ambulatory Visit (HOSPITAL_COMMUNITY): Payer: Self-pay | Admitting: *Deleted

## 2017-05-09 DIAGNOSIS — I5022 Chronic systolic (congestive) heart failure: Secondary | ICD-10-CM | POA: Insufficient documentation

## 2017-05-09 DIAGNOSIS — C50212 Malignant neoplasm of upper-inner quadrant of left female breast: Secondary | ICD-10-CM | POA: Diagnosis not present

## 2017-05-09 DIAGNOSIS — I509 Heart failure, unspecified: Secondary | ICD-10-CM | POA: Diagnosis not present

## 2017-05-09 DIAGNOSIS — F419 Anxiety disorder, unspecified: Secondary | ICD-10-CM | POA: Diagnosis not present

## 2017-05-09 DIAGNOSIS — I110 Hypertensive heart disease with heart failure: Secondary | ICD-10-CM | POA: Diagnosis not present

## 2017-05-09 DIAGNOSIS — J449 Chronic obstructive pulmonary disease, unspecified: Secondary | ICD-10-CM | POA: Diagnosis not present

## 2017-05-09 DIAGNOSIS — Z170 Estrogen receptor positive status [ER+]: Principal | ICD-10-CM

## 2017-05-09 LAB — BASIC METABOLIC PANEL
ANION GAP: 11 (ref 5–15)
BUN: 18 mg/dL (ref 6–20)
CALCIUM: 8.9 mg/dL (ref 8.9–10.3)
CO2: 26 mmol/L (ref 22–32)
Chloride: 99 mmol/L — ABNORMAL LOW (ref 101–111)
Creatinine, Ser: 0.89 mg/dL (ref 0.44–1.00)
GLUCOSE: 166 mg/dL — AB (ref 65–99)
POTASSIUM: 4.3 mmol/L (ref 3.5–5.1)
SODIUM: 136 mmol/L (ref 135–145)

## 2017-05-09 MED ORDER — RADIAPLEXRX EX GEL
Freq: Once | CUTANEOUS | Status: AC
  Administered 2017-05-09: 18:00:00 via TOPICAL

## 2017-05-09 MED ORDER — ALRA NON-METALLIC DEODORANT (RAD-ONC)
1.0000 "application " | Freq: Once | TOPICAL | Status: AC
  Administered 2017-05-09: 1 via TOPICAL

## 2017-05-09 NOTE — Progress Notes (Signed)
Pt here for patient teaching.  Pt given Radiation and You booklet, skin care instructions, Alra deodorant and Radiaplex gel.  Reviewed areas of pertinence such as fatigue, skin changes, breast tenderness and breast swelling . Pt able to give teach back of to pat skin and use unscented/gentle soap,apply Radiaplex bid, avoid applying anything to skin within 4 hours of treatment, avoid wearing an under wire bra and to use an electric razor if they must shave. Pt demonstrated understanding and verbalizes understanding of information given and will contact nursing with any questions or concerns.     [email or web address]       

## 2017-05-10 ENCOUNTER — Ambulatory Visit
Admission: RE | Admit: 2017-05-10 | Discharge: 2017-05-10 | Disposition: A | Discharge status: Home / Self Care | Payer: Medicare Other | Source: Ambulatory Visit | Attending: Radiation Oncology | Admitting: Radiation Oncology

## 2017-05-10 DIAGNOSIS — I110 Hypertensive heart disease with heart failure: Secondary | ICD-10-CM | POA: Diagnosis not present

## 2017-05-10 DIAGNOSIS — J449 Chronic obstructive pulmonary disease, unspecified: Secondary | ICD-10-CM | POA: Diagnosis not present

## 2017-05-10 DIAGNOSIS — Z170 Estrogen receptor positive status [ER+]: Secondary | ICD-10-CM | POA: Diagnosis not present

## 2017-05-10 DIAGNOSIS — M47812 Spondylosis without myelopathy or radiculopathy, cervical region: Secondary | ICD-10-CM | POA: Diagnosis not present

## 2017-05-10 DIAGNOSIS — C50212 Malignant neoplasm of upper-inner quadrant of left female breast: Secondary | ICD-10-CM | POA: Diagnosis not present

## 2017-05-10 DIAGNOSIS — I509 Heart failure, unspecified: Secondary | ICD-10-CM | POA: Diagnosis not present

## 2017-05-10 DIAGNOSIS — F419 Anxiety disorder, unspecified: Secondary | ICD-10-CM | POA: Diagnosis not present

## 2017-05-10 DIAGNOSIS — Z79891 Long term (current) use of opiate analgesic: Secondary | ICD-10-CM | POA: Diagnosis not present

## 2017-05-10 DIAGNOSIS — M47817 Spondylosis without myelopathy or radiculopathy, lumbosacral region: Secondary | ICD-10-CM | POA: Diagnosis not present

## 2017-05-10 DIAGNOSIS — M62830 Muscle spasm of back: Secondary | ICD-10-CM | POA: Diagnosis not present

## 2017-05-10 DIAGNOSIS — G894 Chronic pain syndrome: Secondary | ICD-10-CM | POA: Diagnosis not present

## 2017-05-11 ENCOUNTER — Ambulatory Visit
Admission: RE | Admit: 2017-05-11 | Discharge: 2017-05-11 | Disposition: A | Discharge status: Home / Self Care | Payer: Medicare Other | Source: Ambulatory Visit | Attending: Radiation Oncology | Admitting: Radiation Oncology

## 2017-05-11 DIAGNOSIS — C50212 Malignant neoplasm of upper-inner quadrant of left female breast: Secondary | ICD-10-CM | POA: Diagnosis not present

## 2017-05-11 DIAGNOSIS — F419 Anxiety disorder, unspecified: Secondary | ICD-10-CM | POA: Diagnosis not present

## 2017-05-11 DIAGNOSIS — Z170 Estrogen receptor positive status [ER+]: Secondary | ICD-10-CM | POA: Diagnosis not present

## 2017-05-11 DIAGNOSIS — J449 Chronic obstructive pulmonary disease, unspecified: Secondary | ICD-10-CM | POA: Diagnosis not present

## 2017-05-11 DIAGNOSIS — I110 Hypertensive heart disease with heart failure: Secondary | ICD-10-CM | POA: Diagnosis not present

## 2017-05-11 DIAGNOSIS — I509 Heart failure, unspecified: Secondary | ICD-10-CM | POA: Diagnosis not present

## 2017-05-12 ENCOUNTER — Ambulatory Visit
Admission: RE | Admit: 2017-05-12 | Discharge: 2017-05-12 | Disposition: A | Discharge status: Home / Self Care | Payer: Medicare Other | Source: Ambulatory Visit | Attending: Radiation Oncology | Admitting: Radiation Oncology

## 2017-05-12 DIAGNOSIS — J449 Chronic obstructive pulmonary disease, unspecified: Secondary | ICD-10-CM | POA: Diagnosis not present

## 2017-05-12 DIAGNOSIS — F419 Anxiety disorder, unspecified: Secondary | ICD-10-CM | POA: Diagnosis not present

## 2017-05-12 DIAGNOSIS — I110 Hypertensive heart disease with heart failure: Secondary | ICD-10-CM | POA: Diagnosis not present

## 2017-05-12 DIAGNOSIS — C50212 Malignant neoplasm of upper-inner quadrant of left female breast: Secondary | ICD-10-CM | POA: Diagnosis not present

## 2017-05-12 DIAGNOSIS — Z170 Estrogen receptor positive status [ER+]: Secondary | ICD-10-CM | POA: Diagnosis not present

## 2017-05-12 DIAGNOSIS — I509 Heart failure, unspecified: Secondary | ICD-10-CM | POA: Diagnosis not present

## 2017-05-13 ENCOUNTER — Ambulatory Visit
Admission: RE | Admit: 2017-05-13 | Discharge: 2017-05-13 | Disposition: A | Discharge status: Home / Self Care | Payer: Medicare Other | Source: Ambulatory Visit | Attending: Radiation Oncology | Admitting: Radiation Oncology

## 2017-05-13 DIAGNOSIS — J449 Chronic obstructive pulmonary disease, unspecified: Secondary | ICD-10-CM | POA: Diagnosis not present

## 2017-05-13 DIAGNOSIS — I110 Hypertensive heart disease with heart failure: Secondary | ICD-10-CM | POA: Diagnosis not present

## 2017-05-13 DIAGNOSIS — Z170 Estrogen receptor positive status [ER+]: Secondary | ICD-10-CM | POA: Diagnosis not present

## 2017-05-13 DIAGNOSIS — I509 Heart failure, unspecified: Secondary | ICD-10-CM | POA: Diagnosis not present

## 2017-05-13 DIAGNOSIS — C50212 Malignant neoplasm of upper-inner quadrant of left female breast: Secondary | ICD-10-CM | POA: Diagnosis not present

## 2017-05-13 DIAGNOSIS — F419 Anxiety disorder, unspecified: Secondary | ICD-10-CM | POA: Diagnosis not present

## 2017-05-16 ENCOUNTER — Ambulatory Visit
Admission: RE | Admit: 2017-05-16 | Discharge: 2017-05-16 | Disposition: A | Discharge status: Home / Self Care | Payer: Medicare Other | Source: Ambulatory Visit | Attending: Radiation Oncology | Admitting: Radiation Oncology

## 2017-05-16 DIAGNOSIS — C50212 Malignant neoplasm of upper-inner quadrant of left female breast: Secondary | ICD-10-CM | POA: Diagnosis not present

## 2017-05-16 DIAGNOSIS — I110 Hypertensive heart disease with heart failure: Secondary | ICD-10-CM | POA: Diagnosis not present

## 2017-05-16 DIAGNOSIS — J449 Chronic obstructive pulmonary disease, unspecified: Secondary | ICD-10-CM | POA: Diagnosis not present

## 2017-05-16 DIAGNOSIS — I509 Heart failure, unspecified: Secondary | ICD-10-CM | POA: Diagnosis not present

## 2017-05-16 DIAGNOSIS — F419 Anxiety disorder, unspecified: Secondary | ICD-10-CM | POA: Diagnosis not present

## 2017-05-16 DIAGNOSIS — Z170 Estrogen receptor positive status [ER+]: Secondary | ICD-10-CM | POA: Diagnosis not present

## 2017-05-16 NOTE — Progress Notes (Signed)
[facility]  [phone] Fax:(336) [phone]     ID: [name] DOB: 08-26-1970  MR#: [id number]  NGE#:[id number]  Patient Care Team: [name] as PCP - General (Family Medicine) Bensimhon, Bevelyn Buckles, Roy as Consulting Physician (Cardiology) [name], Krystal Hue, Roy as Consulting Physician (Oncology) Krystal Salvia, Roy as Consulting Physician (Cardiology) Roy, Krystal Mylar, Roy as Consulting Physician (Neurology) Krystal Helling, Roy as Consulting Physician (Pulmonary Disease) Krystal Kelp, Roy as Consulting Physician (General Surgery) Lonie Peak, Roy as Attending Physician (Radiation Oncology) Krystal Gosselin, Roy as Consulting Physician (Orthopedic Surgery) Krystal Neighbor, Roy as Consulting Physician (Urology) [name] as Consulting Physician (Obstetrics and Gynecology) PCP: [name] GYN: [name] SU:  OTHER Roy: [name], [name], [name], [name]  CHIEF COMPLAINT: Contralateral estrogen receptor positive breast cancer  CURRENT TREATMENT: Adjuvant radiation ongoing   INTERVAL HISTORY: [name] returns today for follow-up and treatment of a contralateral estrogen receptor positive breast cancer accompanied by her sister. She is currently receiving adjuvant radiation, she notes that she is fatigued from radiation. She reports that so far her skin is doing well.   She continues on letrozole, with good tolerance. She notes having very intense night sweats and daytime hot flashes. She also has noticed that she is more irritable. She is taking gabapentin 100 mg 3 times per day. She notes that it hasn't helped.   REVIEW OF SYSTEMS:  [name] reports that she has been tired. She reports that she tries to Roy some house work, but some days are harder than others. She occasionally takes naps during the day. She has been having trouble sleeping. She notes that she has been going to bed around 3 am and  only getting 3 hours of sleep. She gets up early to get her daughter ready for school. She notes that she may lay down around 9am until 12pm. She reports that she has lower back, shoulder, and hip pain that she notes comes from her gout. She also has SOB so she does not go out to shop very often. On average she is on her feet for about 20-30 minutes while she is in the store. She denies unusual headaches, visual changes, nausea, vomiting, or dizziness. There has been no unusual cough, phlegm production, or pleurisy. This been no change in bowel or bladder habits. She denies unexplained fatigue or unexplained weight loss, bleeding, rash, or fever. A detailed review of systems was otherwise stable.     BREAST CANCER HISTORY:  from Dr. Theron Arista Rubin's original intake note dated 08/31/2006   "This woman has been in good health.  She had a screening mammogram done via mobile mammogram unit.  A baseline mammogram did show a lesion or suspicious pigmented area at 9 o'clock in the right breast.  [facility].  A digital right diagnostic mammogram and ultrasound was done on 08/01/2006.  Physical examination did palpate a 1.5 cm mass at 9 o'clock position 7 cm from the nipple.  Ultrasound showed this to be 1.5 x 1.3 x 1.0 cm.  Biopsy was recommended.  Biopsy on 08/01/2006 showed invasive adenocarcinoma associated with DCIS, which appeared to be high-grade.  ER and PR were positive at 99% and 98% respectively.  Proliferative index was 16%.  HER-2/neu was 1+.  The patient underwent a lumpectomy and sentinel lymph node evaluation on 08/22/2006.  Prior to that, a MRI of both breasts was done on 08/10/2006, which documented a  2.4 cm spiculated mass in the breast.  No other abnormalities were seen.  The lumpectomy and sentinel lymph node evaluation on 08/22/2006 showed a 2.6 cm, grade 3/3 invasive ductal cancer, lymphovascular invasion was seen focally.  Surgical margins were clear.  A  total of five lymph nodes were removed at the time of sentinel node, one of which was involved with malignancy.  The patient has had an unremarkable postoperative course."  Her subsequent history is detailed below   HISTORY: OF LEFT (CONTRALATERAL) BREAST CANCER [name] has a history of right-sided breast cancer dating back 10 years. She missed her last 2 appointments with me as "no shows". The last time I saw this patient was in 2016.  More recently she noted a mass in her right breast. She underwent bilateral diagnostic mammography with tomography at the [facility] 12/07/2016 found a breast density to be category B. In the right breast there were only postsurgical changes and no suspicious findings. Ultrasound of the right breast showed an area consistent with a 9 fat necrosis in the 4:00 axis 5 cm from the nipple, measuring 1.8 cm. Biopsy of this area 12/27/2016 confirmed fat necrosis, with no evidence of malignancy ([id number]).  In the left breast however there was an irregular mass associated with architectural distortion, altogether measuring approximately 0.7 centimeters. Ultrasound of the left breast confirmed an irregular hypoechoic mass at the 9:30 o'clock axis 6 cm from the nipple measuring 0.8 cm. The left axilla was sonographically benign.  Biopsy of the left breast mass in question 12/07/2016 found ([id number]) an invasive ductal carcinoma, grade 2, estrogen receptor 100% positive, with strong staining intensity, progesterone receptor negative, with an MIB-1 of 10%, and no HER-2 amplification, the signals ratio being 1.24 and the number per cell 2.05.  This case was discussed at the multidisciplinary breast cancer conference 12/22/2016. At that point a preliminary plan was suggested: Breast conserving surgery with sentinel lymph node sampling followed by radiation versus consideration of bilateral mastectomies, the concern of course being the patient's congestive heart failure.  Anti-estrogens would follow at the completion of local treatment. A full genetics panel was also suggested.  The patient's subsequent history is as detailed below.   PAST MEDICAL HISTORY: Past Medical History:  Diagnosis Date  . Acne   . AICD (automatic cardioverter/defibrillator) present   . Anemia 1980's   [id number]  . Anginal pain (HCC)    sporatic , been going on for yrs,  . Anxiety   . Arthritis   . 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 Blood transfusion 1980's   1987 or 1988  . Breast calcifications    right breast  . Breast cancer (HCC)    right;  . 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 Cardiac MRI 05/2011 EF 32%. ICD implantation 10/2011 ([employer])  . 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 Conversion disorder   . 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 Depression   . Endometriosis   . Family history of colon cancer   . Family history of ovarian cancer   . Fibromyalgia   . GERD (gastroesophageal reflux disease)   . Headache   . Hepatomegaly   . History of stomach ulcers   . Hyperlipidemia   . Hypertension    c/b orthostatic hypotention  . ICD (implantable cardiac defibrillator) in place   . Insomnia   . Interstitial cystitis   . Left  eye injury 12/2014  . 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   . Nonischemic cardiomyopathy (HCC)    related to chemo; EF 30% 05/2011  . Obesity   . Palpitation    normal sinus rhythm only on 21 day heart monitor  . Panic attacks   . Peripheral neuropathy    chemo- induced  . Pneumonia 2000's   "once"  . Seasonal allergies   . Seizures (HCC)    last seizure  last week: non elipsey seisure  . Type II diabetes mellitus (HCC)     PAST SURGICAL HISTORY: Past Surgical History:  Procedure Laterality Date  . ABDOMINAL HYSTERECTOMY     partial  .  BREAST EXCISIONAL BIOPSY     right benign 2012  . BREAST LUMPECTOMY  2008; 2013   right radiation  . BREAST LUMPECTOMY WITH RADIOACTIVE SEED AND SENTINEL LYMPH NODE BIOPSY Left 02/21/2017   Procedure: LEFT BREAST LUMPECTOMY WITH RADIOACTIVE SEED AND LEFT AXILLARY DEEP SENTINEL LYMPH NODE BIOPSY;  Surgeon: Krystal Kelp, Roy;  Location: [facility] OR;  Service: General;  Laterality: Left;  . CARDIAC DEFIBRILLATOR PLACEMENT  11/03/11  . CESAREAN SECTION    . CHOLECYSTECTOMY  ~ 2009  . COLONOSCOPY    . ICD REVISION N/A 02/21/2017   Procedure: ICD Revision;  Surgeon: Krystal Salvia, Roy;  Location: [facility] INVASIVE CV LAB;  Service: Cardiovascular;  Laterality: N/A;  . IMPLANTABLE CARDIOVERTER DEFIBRILLATOR IMPLANT N/A 11/03/2011   Procedure: IMPLANTABLE CARDIOVERTER DEFIBRILLATOR IMPLANT;  Surgeon: Krystal Salvia, Roy; MDT   . LAPAROSCOPIC ENDOMETRIOSIS FULGURATION  1990's  . NASAL SINUS SURGERY    . PORT-A-CATH REMOVAL  2010?   left chest; placed in 2008  . TONSILLECTOMY  1980's  . TUBAL LIGATION  1990's    FAMILY HISTORY Family History  Problem Relation Age of Onset  . Heart disease Mother   . Arthritis Mother   . Hypertension Mother   . Stroke Mother   . Fibromyalgia Mother   . Coronary artery disease Unknown   . Heart attack Unknown   . Heart disease Maternal Uncle   . Colon cancer Paternal Uncle        dx over 64  . Heart disease Maternal Grandmother   . Heart attack Maternal Grandmother   . Ovarian cancer Paternal Grandmother   . Fibromyalgia Sister   . Ovarian cancer Paternal Aunt    the patient's father is still living, at age 71. He has a history of Crohn's disease. The patient's mother is living, age 25. She has a history of fibromyalgia. The patient has 3 half-brothers, one sister. The patient's father's mother had either ovarian cancer or cervical cancer. The patient was tested in June 2008 for mutations in the BRCA1 and 2 genes. Complete sequencing found no mutations   GYNECOLOGIC  HISTORY:  No LMP recorded. Patient has had a hysterectomy. menarche age 22, first live birth age 73. She is GX P1. She underwent simple hysterectomy, no salpingo-oophorectomy in 2007.   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 Her husband of 23+ years, Shirlee Limerick, in addition to his regular job is Education officer, environmental of their [employer]. There daughter,Maya, is currently 13. The patient attends the [employer] locally.     ADVANCED DIRECTIVES: Not in place. The patient's husband is her healthcare power of attorney   HEALTH MAINTENANCE: Social History   Tobacco Use  . Smoking status: Never Smoker  . Smokeless  tobacco: Never Used  Substance Use Topics  . Alcohol use: Yes    Comment: social drinker   . Drug use: No     Colonoscopy:  PAP:  Bone density:  Lipid panel:  Allergies  Allergen Reactions  . Ace Inhibitors Other (See Comments)    hyperkalemia  . Reglan [Metoclopramide Hcl] Anxiety  . Adhesive [Tape] Itching and Rash    Specifically tegaderm, normal adhesive tape is ok    Current Outpatient Medications  Medication Sig Dispense Refill  . acetaminophen (TYLENOL) 500 MG tablet Take 1,000 mg by mouth 2 (two) times daily as needed for moderate pain.    Marland Kitchen albuterol (PROVENTIL HFA;VENTOLIN HFA) 108 (90 Base) MCG/ACT inhaler Inhale 1-2 puffs into the lungs every 6 (six) hours as needed for wheezing or shortness of breath.     . allopurinol (ZYLOPRIM) 300 MG tablet Take 300 mg by mouth daily.  2  . baclofen (LIORESAL) 10 MG tablet TAKE 1 TABLET BY MOUTH 2 (TWO) TIMES DAILY AS NEEDED FOR MUSCLE SPASMS. 60 tablet 0  . BLACK CURRANT SEED OIL PO Take 15 mLs by mouth daily.     . Blood Glucose Monitoring Suppl (FREESTYLE FREEDOM LITE) w/Device KIT Check fasting and 2 hour PP 1 each 1  . carvedilol (COREG) 25 MG tablet Take 0.5 tablets (12.5 mg total) by mouth 2 (two) times daily with a meal. 60 tablet 6  . Cholecalciferol (VITAMIN D3) 5000 UNITS CAPS Take 5,000 Units by  mouth daily.     . Coenzyme Q10 (CO Q-10) 100 MG CAPS Take 100 mg by mouth daily.    . Cyanocobalamin 2500 MCG SUBL Place 2,500 mcg under the tongue daily.    . cyclobenzaprine (FLEXERIL) 10 MG tablet Take 10 mg by mouth 3 (three) times daily as needed for muscle spasms.    . digoxin (LANOXIN) 0.125 MG tablet TAKE 0.5 TABLETS (0.0625 MG TOTAL) BY MOUTH DAILY. 15 tablet 2  . divalproex (DEPAKOTE ER) 500 MG 24 hr tablet Take 1 tablet in a.m. and 2 tablets in p.m. 90 tablet 5  . DULoxetine (CYMBALTA) 30 MG capsule Take 1 capsule (30 mg total) by mouth 2 (two) times daily. 60 capsule 5  . ferrous sulfate 325 (65 FE) MG tablet Take 325 mg by mouth daily with breakfast.    . FREESTYLE UNISTICK II LANCETS MISC Check fasting blood sugar in the morning and check sugar after largest meal of the day. 100 each 12  . GLUCOSAMINE-CHONDROITIN PO Take 1,200 mg by mouth daily.    Marland Kitchen glucose blood (FREESTYLE TEST STRIPS) test strip To be filled with Freedom Lite test strips.   Check fasting blood sugar in the morning and check sugar after largest meal of the day. 100 each 12  . hyaluronate sodium (RADIAPLEXRX) GEL Apply 1 application topically once.    . hydrOXYzine (ATARAX/VISTARIL) 25 MG tablet Take 25 mg by mouth at bedtime as needed for itching (DOESN'T TAKE WITH TRAZODONE).    Marland Kitchen ivabradine (CORLANOR) 5 MG TABS tablet Take 1.5 tablets (7.5 mg total) 2 (two) times daily with a meal by mouth. 90 tablet 3  . letrozole (FEMARA) 2.5 MG tablet Take 1 tablet (2.5 mg total) by mouth daily. 90 tablet 4  . loratadine (CLARITIN) 10 MG tablet Take 10 mg by mouth daily.    Marland Kitchen losartan (COZAAR) 25 MG tablet Take 1 tablet (25 mg total) by mouth daily. 90 tablet 3  . Menthol, Topical Analgesic, (ICY HOT EX) Apply 1  application topically daily as needed (muscle pain).    Marland Kitchen metolazone (ZAROXOLYN) 2.5 MG tablet Take 1 tablet (2.5 mg total) by mouth daily as needed (edema). 15 tablet 0  . Milk Thistle 150 MG CAPS Take 450 mg by  mouth daily.     . Multiple Vitamin (MULTIVITAMIN WITH MINERALS) TABS Take 1 tablet by mouth daily. Reported on 10/13/2015    . non-metallic deodorant Thornton Papas) MISC Apply 1 application topically daily as needed.    Marland Kitchen omeprazole (PRILOSEC) 40 MG capsule TAKE 1 CAPSULE (40 MG TOTAL) BY MOUTH 2 (TWO) TIMES DAILY BEFORE A MEAL. BREAKFAST AND SUPPER 60 capsule 9  . ondansetron (ZOFRAN) 4 MG tablet Take 1 tablet (4 mg total) by mouth every 8 (eight) hours as needed for nausea or vomiting. 20 tablet 3  . PATADAY 0.2 % SOLN Place 1 drop into both eyes daily as needed (allergies).     . pentosan polysulfate (ELMIRON) 100 MG capsule Take 100 mg by mouth 2 (two) times daily as needed (bladder spasms).     . potassium chloride SA (K-DUR,KLOR-CON) 20 MEQ tablet Take 2 tablets (40 mEq total) by mouth daily. 180 tablet 3  . RESTASIS 0.05 % ophthalmic emulsion     . Sodium Chloride-Sodium Bicarb (NETI POT SINUS WASH NA) Place 1 Dose into the nose daily as needed (congestion).    Marland Kitchen spironolactone (ALDACTONE) 25 MG tablet Take 1 tablet (25 mg total) daily by mouth. 90 tablet 3  . torsemide (DEMADEX) 20 MG tablet TAKE 2 TABLETS (40 MG TOTAL) BY MOUTH DAILY. 60 tablet 6  . traZODone (DESYREL) 50 MG tablet Take 50 mg by mouth at bedtime as needed for sleep.     Marland Kitchen triamcinolone cream (KENALOG) 0.1 % Apply 1 application topically 2 (two) times daily as needed (eczema).    . venlafaxine XR (EFFEXOR-XR) 37.5 MG 24 hr capsule Take 1 capsule (37.5 mg total) by mouth daily with breakfast. 30 capsule 6  . Vitamin D, Ergocalciferol, (DRISDOL) 50000 units CAPS capsule Take 1 capsule (50,000 Units total) by mouth every 7 (seven) days. 12 capsule 3   No current facility-administered medications for this visit.     OBJECTIVE: Young appearing African-American woman in no acute distress  Vitals:   05/17/17 1525  BP: 94/69  Pulse: 93  Resp: 18  Temp: 97.9 F (36.6 C)  SpO2: 97%     Body mass index is 40.22 kg/m.    ECOG FS:2  - Symptomatic, <50% confined to bed  Sclerae unicteric, EOMs intact No cervical or supraclavicular adenopathy Lungs no rales or rhonchi Heart regular rate and rhythm Abd soft, nontender, positive bowel sounds MSK no focal spinal tenderness Neuro: nonfocal, well oriented, positive affect Breasts: Right breast is status post lumpectomy and radiation.  There is some distortion of the contour but no evidence of local recurrence.  The left breast is status post lumpectomy and is currently receiving radiation.  There is early hyperpigmentation, but no desquamation.  Both axillae are benign.  LAB RESULTS:  CMP     Component Value Date/Time   NA 136 05/09/2017 1540   NA 140 03/02/2017 1119   NA 134 (L) 02/01/2017 1435   K 4.3 05/09/2017 1540   K 4.0 02/01/2017 1435   CL 99 (L) 05/09/2017 1540   CO2 26 05/09/2017 1540   CO2 31 (H) 02/01/2017 1435   GLUCOSE 166 (H) 05/09/2017 1540   GLUCOSE 89 02/01/2017 1435   BUN 18 05/09/2017 1540  BUN 16 03/02/2017 1119   BUN 23.2 02/01/2017 1435   CREATININE 0.89 05/09/2017 1540   CREATININE 1.3 (H) 02/01/2017 1435   CALCIUM 8.9 05/09/2017 1540   CALCIUM 10.4 02/01/2017 1435   PROT 7.1 03/02/2017 1119   PROT 9.0 (H) 02/01/2017 1435   ALBUMIN 3.8 03/02/2017 1119   ALBUMIN 3.6 02/01/2017 1435   AST 22 03/02/2017 1119   AST 20 02/01/2017 1435   ALT 15 03/02/2017 1119   ALT 9 02/01/2017 1435   ALKPHOS 85 03/02/2017 1119   ALKPHOS 111 02/01/2017 1435   BILITOT 0.8 03/02/2017 1119   BILITOT 2.16 (H) 02/01/2017 1435   GFRNONAA >60 05/09/2017 1540   GFRAA >60 05/09/2017 1540    INo results found for: SPEP, UPEP  Lab Results  Component Value Date   WBC 12.5 (H) 03/02/2017   NEUTROABS 8.3 (H) 03/02/2017   HGB 12.6 03/02/2017   HCT 37.1 03/02/2017   MCV 92 03/02/2017   PLT 170 03/02/2017      Chemistry      Component Value Date/Time   NA 136 05/09/2017 1540   NA 140 03/02/2017 1119   NA 134 (L) 02/01/2017 1435   K 4.3 05/09/2017  1540   K 4.0 02/01/2017 1435   CL 99 (L) 05/09/2017 1540   CO2 26 05/09/2017 1540   CO2 31 (H) 02/01/2017 1435   BUN 18 05/09/2017 1540   BUN 16 03/02/2017 1119   BUN 23.2 02/01/2017 1435   CREATININE 0.89 05/09/2017 1540   CREATININE 1.3 (H) 02/01/2017 1435   GLU 89 02/18/2015      Component Value Date/Time   CALCIUM 8.9 05/09/2017 1540   CALCIUM 10.4 02/01/2017 1435   ALKPHOS 85 03/02/2017 1119   ALKPHOS 111 02/01/2017 1435   AST 22 03/02/2017 1119   AST 20 02/01/2017 1435   ALT 15 03/02/2017 1119   ALT 9 02/01/2017 1435   BILITOT 0.8 03/02/2017 1119   BILITOT 2.16 (H) 02/01/2017 1435       Lab Results  Component Value Date   LABCA2 24 08/19/2011    No components found for: [id number]  No results for input(s): INR in the last 168 hours.  Urinalysis    Component Value Date/Time   COLORURINE AMBER (A) 02/18/2016 1741   APPEARANCEUR CLEAR 02/18/2016 1741   LABSPEC 1.018 02/18/2016 1741   PHURINE 5.5 02/18/2016 1741   GLUCOSEU NEGATIVE 02/18/2016 1741   HGBUR NEGATIVE 02/18/2016 1741   BILIRUBINUR SMALL (A) 02/18/2016 1741   KETONESUR 15 (A) 02/18/2016 1741   PROTEINUR NEGATIVE 02/18/2016 1741   UROBILINOGEN 0.2 05/02/2008 1222   NITRITE NEGATIVE 02/18/2016 1741   LEUKOCYTESUR NEGATIVE 02/18/2016 1741    STUDIES: No results found.  ASSESSMENT: 46 y.o.  BRCA negative [place] woman   RIGHT BREAST CANCER:  (1) status post right lumpectomy and sentinel lymph node sampling 08/22/2006 for a pT2 pN1, stage IIB invasive ductal carcinoma, grade 3, estrogen receptor 99% positive, progesterone receptor 98% positive, with an MIB-1 of 16% and no HER-2 amplification   (a) biopsy of a palpable area in the right breast 4:00 position 12/27/2016 showed only fat necrosis  (2) completion axillary dissection 09/07/2006 showed no additional positive lymph nodes (total of 17 lymph nodes sampled, one positive)  (3) adjuvant chemotherapy followed ECOG 10/12/2001, arm D   (a)  started 10/21/2006 and consisted of cyclophosphamide and doxorubicin given in dose dense fashion 4, completed 12/02/2006 (total doxorubicin dose 240 mg/m), followed by   (b) weekly paclitaxel  12 together with either bevacizumab or placebo given every 3 weeks, completed 03/03/2007    (c) bevacizumab was to have been continued, but protocol-mandated echocardiogram 03/06/2007 showed the patient's previously normal left ventricular ejection fraction to have dropped to 35%.  (4)  completed radiation treatment to the right breast 05/17/2007-- 5040 cGy plus a 1260 cGy boost  (5) received letrozole between December 2008 and March 2013  LEFT BREAST CANCER:  (6) status post left breast upper inner quadrant biopsy 12/07/2016 for a clinical T1b N0, stage IA invasive ductal carcinoma, grade 2, estrogen receptor positive, progesterone receptor negative, with no HER-2 amplification, and an MIB-1 of 10%.  (7) left lumpectomy and sentinel lymph node sampling 02/21/2017 showed a pT1b pN0, stage IA invasive ductal carcinoma, grade 1, with negative margins.  (a) biopsy of a of a suspicious area in the right breast 12/27/2016 showed fat necrosis.  (8) genetics testing 02/02/2017 through the Common Hereditary Gene Panel offered by [facility] found no deleterious mutations in APC, ATM, AXIN2, BARD1, BMPR1A, BRCA1, BRCA2, BRIP1, CDH1, CDKN2A (p14ARF), CDKN2A (p16INK4a), CHEK2, CTNNA1, DICER1, EPCAM (Deletion/duplication testing only), GREM1 (promoter region deletion/duplication testing only), KIT, MEN1, MLH1, MSH2, MSH3, MSH6, MUTYH, NBN, NF1, NHTL1, PALB2, PDGFRA, PMS2, POLD1, POLE, PTEN, RAD50, RAD51C, RAD51D, SDHB, SDHC, SDHD, SMAD4, SMARCA4. STK11, TP53, TSC1, TSC2, and VHL.  The following genes were evaluated for sequence changes only: SDHA and HOXB13 c.251G>A variant only.    (a) a variant of uncertain significance, ATM c.5168T>C (p.Val1723Ala) was identified  (9) adjuvant radiation to be completed  06/03/2017  (10) letrozole started 02/01/2017  (a) received 1 dose of fulvestrant 01/04/2017--unable to tolerate  ADDITIONAL CONCERNS:  (a) severe cardiomyopathy with most recent echocardiogram 06/24/2014 showing an ejection fraction  20 -25%  (a) ICD implanted May 2013  (b) fibromyalgia, with chronic pain and some residual grade 1 neuropathy from chemotherapy   (8c sleep apnea documented through polysomnography 07/24/2013  (d) conversion disorder with no evidence of myotonia or myotonic dystrophy on exam at [facility] (under Azzie Almas Roy 11/15/2013)   (e) all blood draws to be from left arm   PLAN: [name] has recovered well from her most recent breast cancer surgery and is tolerating radiation well so far.  She also continues on letrozole, with no significant side effects from that medication except for some hot flashes and perhaps some irritability.  Today we opt her venlafaxine to 75 mg daily.  She will take this for 2 or 3 weeks.  If she finds that it is helpful and is not causing her any significant side effects she will let us know and we will put in a prescription for the 75 mg tablet.  Otherwise we will consider going up to 150 mg.  She is also on gabapentin at apparently 100 mg 3 times daily.  Perhaps if her pain physician increases that dose her hot flashes also would get better since gabapentin is very good at controlling vasomotor symptoms of menopause.  I am going to see her again in April.  She will be done with her radiation and should have largely recovered from it by that visit..  I will set her up for long-term follow-up at that time.  She knows to call for any other problems that may develop before then.  Aydrian Halpin, [facility] Hue, Roy  05/17/17 4:20 PM Medical Oncology and Hematology [facility] [address] [place], [place] [address] Tel. [phone]    Fax. [phone]    This document serves  as a record of services personally performed by Ruthann Cancer, Roy. It was created on his behalf by Merideth Abbey, a trained medical scribe. The creation of this record is based on the scribe's personal observations and the provider's statements to them.   I have reviewed the above documentation for accuracy and completeness, and I agree with the above.

## 2017-05-17 ENCOUNTER — Ambulatory Visit (HOSPITAL_BASED_OUTPATIENT_CLINIC_OR_DEPARTMENT_OTHER): Payer: Medicare Other | Admitting: Oncology

## 2017-05-17 ENCOUNTER — Telehealth: Payer: Self-pay | Admitting: Oncology

## 2017-05-17 ENCOUNTER — Other Ambulatory Visit: Payer: Self-pay | Admitting: Internal Medicine

## 2017-05-17 ENCOUNTER — Ambulatory Visit
Admission: RE | Admit: 2017-05-17 | Discharge: 2017-05-17 | Disposition: A | Discharge status: Home / Self Care | Payer: Medicare Other | Source: Ambulatory Visit | Attending: Radiation Oncology | Admitting: Radiation Oncology

## 2017-05-17 VITALS — BP 94/69 | HR 93 | Temp 97.9°F | Resp 18 | Ht 67.0 in | Wt 256.8 lb

## 2017-05-17 DIAGNOSIS — Z170 Estrogen receptor positive status [ER+]: Secondary | ICD-10-CM

## 2017-05-17 DIAGNOSIS — J449 Chronic obstructive pulmonary disease, unspecified: Secondary | ICD-10-CM | POA: Diagnosis not present

## 2017-05-17 DIAGNOSIS — C50212 Malignant neoplasm of upper-inner quadrant of left female breast: Secondary | ICD-10-CM

## 2017-05-17 DIAGNOSIS — F419 Anxiety disorder, unspecified: Secondary | ICD-10-CM | POA: Diagnosis not present

## 2017-05-17 DIAGNOSIS — Z79811 Long term (current) use of aromatase inhibitors: Secondary | ICD-10-CM

## 2017-05-17 DIAGNOSIS — C50811 Malignant neoplasm of overlapping sites of right female breast: Secondary | ICD-10-CM

## 2017-05-17 DIAGNOSIS — I509 Heart failure, unspecified: Secondary | ICD-10-CM | POA: Diagnosis not present

## 2017-05-17 DIAGNOSIS — I110 Hypertensive heart disease with heart failure: Secondary | ICD-10-CM | POA: Diagnosis not present

## 2017-05-17 NOTE — Telephone Encounter (Signed)
Gave patient AVS and calendar of upcoming April 2019 appointments. °

## 2017-05-18 ENCOUNTER — Ambulatory Visit
Admission: RE | Admit: 2017-05-18 | Discharge: 2017-05-18 | Disposition: A | Discharge status: Home / Self Care | Payer: Medicare Other | Source: Ambulatory Visit | Attending: Radiation Oncology | Admitting: Radiation Oncology

## 2017-05-18 DIAGNOSIS — J449 Chronic obstructive pulmonary disease, unspecified: Secondary | ICD-10-CM | POA: Diagnosis not present

## 2017-05-18 DIAGNOSIS — Z170 Estrogen receptor positive status [ER+]: Secondary | ICD-10-CM | POA: Diagnosis not present

## 2017-05-18 DIAGNOSIS — I509 Heart failure, unspecified: Secondary | ICD-10-CM | POA: Diagnosis not present

## 2017-05-18 DIAGNOSIS — F419 Anxiety disorder, unspecified: Secondary | ICD-10-CM | POA: Diagnosis not present

## 2017-05-18 DIAGNOSIS — I110 Hypertensive heart disease with heart failure: Secondary | ICD-10-CM | POA: Diagnosis not present

## 2017-05-18 DIAGNOSIS — C50212 Malignant neoplasm of upper-inner quadrant of left female breast: Secondary | ICD-10-CM | POA: Diagnosis not present

## 2017-05-19 ENCOUNTER — Ambulatory Visit
Admission: RE | Admit: 2017-05-19 | Discharge: 2017-05-19 | Disposition: A | Discharge status: Home / Self Care | Payer: Medicare Other | Source: Ambulatory Visit | Attending: Radiation Oncology | Admitting: Radiation Oncology

## 2017-05-19 DIAGNOSIS — I509 Heart failure, unspecified: Secondary | ICD-10-CM | POA: Diagnosis not present

## 2017-05-19 DIAGNOSIS — J449 Chronic obstructive pulmonary disease, unspecified: Secondary | ICD-10-CM | POA: Diagnosis not present

## 2017-05-19 DIAGNOSIS — Z170 Estrogen receptor positive status [ER+]: Secondary | ICD-10-CM | POA: Diagnosis not present

## 2017-05-19 DIAGNOSIS — F419 Anxiety disorder, unspecified: Secondary | ICD-10-CM | POA: Diagnosis not present

## 2017-05-19 DIAGNOSIS — C50212 Malignant neoplasm of upper-inner quadrant of left female breast: Secondary | ICD-10-CM | POA: Diagnosis not present

## 2017-05-19 DIAGNOSIS — I110 Hypertensive heart disease with heart failure: Secondary | ICD-10-CM | POA: Diagnosis not present

## 2017-05-20 ENCOUNTER — Ambulatory Visit: Payer: Medicare Other | Admitting: Radiation Oncology

## 2017-05-20 ENCOUNTER — Ambulatory Visit
Admission: RE | Admit: 2017-05-20 | Discharge: 2017-05-20 | Disposition: A | Discharge status: Home / Self Care | Payer: Medicare Other | Source: Ambulatory Visit | Attending: Radiation Oncology | Admitting: Radiation Oncology

## 2017-05-20 DIAGNOSIS — J449 Chronic obstructive pulmonary disease, unspecified: Secondary | ICD-10-CM | POA: Diagnosis not present

## 2017-05-20 DIAGNOSIS — F419 Anxiety disorder, unspecified: Secondary | ICD-10-CM | POA: Diagnosis not present

## 2017-05-20 DIAGNOSIS — Z170 Estrogen receptor positive status [ER+]: Secondary | ICD-10-CM | POA: Diagnosis not present

## 2017-05-20 DIAGNOSIS — C50212 Malignant neoplasm of upper-inner quadrant of left female breast: Secondary | ICD-10-CM | POA: Diagnosis not present

## 2017-05-20 DIAGNOSIS — I509 Heart failure, unspecified: Secondary | ICD-10-CM | POA: Diagnosis not present

## 2017-05-20 DIAGNOSIS — I110 Hypertensive heart disease with heart failure: Secondary | ICD-10-CM | POA: Diagnosis not present

## 2017-05-23 ENCOUNTER — Ambulatory Visit: Payer: Medicare Other | Admitting: Radiation Oncology

## 2017-05-23 ENCOUNTER — Ambulatory Visit: Payer: Medicare Other

## 2017-05-24 ENCOUNTER — Ambulatory Visit: Payer: Medicare Other

## 2017-05-24 ENCOUNTER — Ambulatory Visit: Payer: Medicare Other | Admitting: Radiation Oncology

## 2017-05-25 ENCOUNTER — Encounter: Payer: Medicare Other | Admitting: Internal Medicine

## 2017-05-25 ENCOUNTER — Ambulatory Visit: Payer: Medicare Other

## 2017-05-26 ENCOUNTER — Ambulatory Visit
Admission: RE | Admit: 2017-05-26 | Discharge: 2017-05-26 | Disposition: A | Discharge status: Home / Self Care | Payer: Medicare Other | Source: Ambulatory Visit | Attending: Radiation Oncology | Admitting: Radiation Oncology

## 2017-05-26 DIAGNOSIS — J449 Chronic obstructive pulmonary disease, unspecified: Secondary | ICD-10-CM | POA: Diagnosis not present

## 2017-05-26 DIAGNOSIS — C50212 Malignant neoplasm of upper-inner quadrant of left female breast: Secondary | ICD-10-CM | POA: Diagnosis not present

## 2017-05-26 DIAGNOSIS — Z170 Estrogen receptor positive status [ER+]: Secondary | ICD-10-CM | POA: Diagnosis not present

## 2017-05-26 DIAGNOSIS — I509 Heart failure, unspecified: Secondary | ICD-10-CM | POA: Diagnosis not present

## 2017-05-26 DIAGNOSIS — F419 Anxiety disorder, unspecified: Secondary | ICD-10-CM | POA: Diagnosis not present

## 2017-05-26 DIAGNOSIS — I110 Hypertensive heart disease with heart failure: Secondary | ICD-10-CM | POA: Diagnosis not present

## 2017-05-27 ENCOUNTER — Encounter: Payer: Self-pay | Admitting: Internal Medicine

## 2017-05-27 ENCOUNTER — Ambulatory Visit
Admission: RE | Admit: 2017-05-27 | Discharge: 2017-05-27 | Disposition: A | Discharge status: Home / Self Care | Payer: Medicare Other | Source: Ambulatory Visit | Attending: Radiation Oncology | Admitting: Radiation Oncology

## 2017-05-27 ENCOUNTER — Other Ambulatory Visit (HOSPITAL_COMMUNITY): Payer: Self-pay | Admitting: Internal Medicine

## 2017-05-27 DIAGNOSIS — Z170 Estrogen receptor positive status [ER+]: Secondary | ICD-10-CM | POA: Diagnosis not present

## 2017-05-27 DIAGNOSIS — I509 Heart failure, unspecified: Secondary | ICD-10-CM | POA: Diagnosis not present

## 2017-05-27 DIAGNOSIS — I110 Hypertensive heart disease with heart failure: Secondary | ICD-10-CM | POA: Diagnosis not present

## 2017-05-27 DIAGNOSIS — J449 Chronic obstructive pulmonary disease, unspecified: Secondary | ICD-10-CM | POA: Diagnosis not present

## 2017-05-27 DIAGNOSIS — F419 Anxiety disorder, unspecified: Secondary | ICD-10-CM | POA: Diagnosis not present

## 2017-05-27 DIAGNOSIS — C50212 Malignant neoplasm of upper-inner quadrant of left female breast: Secondary | ICD-10-CM | POA: Diagnosis not present

## 2017-05-28 ENCOUNTER — Ambulatory Visit
Admission: RE | Admit: 2017-05-28 | Discharge: 2017-05-28 | Disposition: A | Discharge status: Home / Self Care | Payer: Medicare Other | Source: Ambulatory Visit | Attending: Radiation Oncology | Admitting: Radiation Oncology

## 2017-05-28 DIAGNOSIS — C50212 Malignant neoplasm of upper-inner quadrant of left female breast: Secondary | ICD-10-CM | POA: Diagnosis not present

## 2017-05-28 DIAGNOSIS — F419 Anxiety disorder, unspecified: Secondary | ICD-10-CM | POA: Diagnosis not present

## 2017-05-28 DIAGNOSIS — I110 Hypertensive heart disease with heart failure: Secondary | ICD-10-CM | POA: Diagnosis not present

## 2017-05-28 DIAGNOSIS — I509 Heart failure, unspecified: Secondary | ICD-10-CM | POA: Diagnosis not present

## 2017-05-28 DIAGNOSIS — J449 Chronic obstructive pulmonary disease, unspecified: Secondary | ICD-10-CM | POA: Diagnosis not present

## 2017-05-28 DIAGNOSIS — Z170 Estrogen receptor positive status [ER+]: Secondary | ICD-10-CM | POA: Diagnosis not present

## 2017-05-30 ENCOUNTER — Other Ambulatory Visit: Payer: Self-pay | Admitting: *Deleted

## 2017-05-30 ENCOUNTER — Other Ambulatory Visit (HOSPITAL_COMMUNITY): Payer: Self-pay | Admitting: Internal Medicine

## 2017-05-30 ENCOUNTER — Ambulatory Visit
Admission: RE | Admit: 2017-05-30 | Discharge: 2017-05-30 | Disposition: A | Discharge status: Home / Self Care | Payer: Medicare Other | Source: Ambulatory Visit | Attending: Radiation Oncology | Admitting: Radiation Oncology

## 2017-05-30 ENCOUNTER — Encounter: Payer: Self-pay | Admitting: Radiation Oncology

## 2017-05-30 ENCOUNTER — Ambulatory Visit: Payer: Medicare Other

## 2017-05-30 ENCOUNTER — Ambulatory Visit (HOSPITAL_BASED_OUTPATIENT_CLINIC_OR_DEPARTMENT_OTHER): Payer: Medicare Other | Admitting: Medical

## 2017-05-30 ENCOUNTER — Ambulatory Visit (HOSPITAL_BASED_OUTPATIENT_CLINIC_OR_DEPARTMENT_OTHER): Payer: Medicare Other

## 2017-05-30 VITALS — BP 93/62 | HR 68 | Temp 98.1°F | Resp 18 | Ht 67.0 in | Wt 266.4 lb

## 2017-05-30 DIAGNOSIS — I509 Heart failure, unspecified: Secondary | ICD-10-CM | POA: Diagnosis not present

## 2017-05-30 DIAGNOSIS — C50811 Malignant neoplasm of overlapping sites of right female breast: Secondary | ICD-10-CM

## 2017-05-30 DIAGNOSIS — I5023 Acute on chronic systolic (congestive) heart failure: Secondary | ICD-10-CM | POA: Diagnosis not present

## 2017-05-30 DIAGNOSIS — I110 Hypertensive heart disease with heart failure: Secondary | ICD-10-CM | POA: Diagnosis not present

## 2017-05-30 DIAGNOSIS — E876 Hypokalemia: Secondary | ICD-10-CM | POA: Diagnosis not present

## 2017-05-30 DIAGNOSIS — C50212 Malignant neoplasm of upper-inner quadrant of left female breast: Secondary | ICD-10-CM

## 2017-05-30 DIAGNOSIS — Z79811 Long term (current) use of aromatase inhibitors: Secondary | ICD-10-CM

## 2017-05-30 DIAGNOSIS — Z853 Personal history of malignant neoplasm of breast: Secondary | ICD-10-CM | POA: Diagnosis not present

## 2017-05-30 DIAGNOSIS — Z170 Estrogen receptor positive status [ER+]: Principal | ICD-10-CM

## 2017-05-30 DIAGNOSIS — R0602 Shortness of breath: Secondary | ICD-10-CM

## 2017-05-30 DIAGNOSIS — F419 Anxiety disorder, unspecified: Secondary | ICD-10-CM | POA: Diagnosis not present

## 2017-05-30 DIAGNOSIS — J449 Chronic obstructive pulmonary disease, unspecified: Secondary | ICD-10-CM | POA: Diagnosis not present

## 2017-05-30 LAB — COMPREHENSIVE METABOLIC PANEL
ALBUMIN: 3.4 g/dL — AB (ref 3.5–5.0)
ALK PHOS: 102 U/L (ref 40–150)
ALT: 15 U/L (ref 0–55)
AST: 15 U/L (ref 5–34)
Anion Gap: 14 mEq/L — ABNORMAL HIGH (ref 3–11)
BUN: 13.4 mg/dL (ref 7.0–26.0)
CALCIUM: 8.8 mg/dL (ref 8.4–10.4)
CO2: 28 mEq/L (ref 22–29)
CREATININE: 1 mg/dL (ref 0.6–1.1)
Chloride: 100 mEq/L (ref 98–109)
EGFR: >60 mL/min/{1.73_m2} (ref >60)
GLUCOSE: 113 mg/dL (ref 70–140)
Potassium: 3.1 mEq/L — ABNORMAL LOW (ref 3.5–5.1)
Sodium: 141 mEq/L (ref 136–145)
Total Bilirubin: 0.62 mg/dL (ref 0.20–1.20)
Total Protein: 7.1 g/dL (ref 6.4–8.3)

## 2017-05-30 LAB — CBC WITH DIFFERENTIAL/PLATELET
BASO%: 0.7 % (ref 0.0–2.0)
Basophils Absolute: 0 10*3/uL (ref 0.0–0.1)
EOS ABS: 0 10*3/uL (ref 0.0–0.5)
EOS%: 0.9 % (ref 0.0–7.0)
HCT: 35 % (ref 34.8–46.6)
HGB: 11.4 g/dL — ABNORMAL LOW (ref 11.6–15.9)
LYMPH%: 16.9 % (ref 14.0–49.7)
MCH: 34 pg (ref 25.1–34.0)
MCHC: 32.7 g/dL (ref 31.5–36.0)
MCV: 104.2 fL — AB (ref 79.5–101.0)
MONO#: 0.6 10*3/uL (ref 0.1–0.9)
MONO%: 11.4 % (ref 0.0–14.0)
NEUT%: 70.1 % (ref 38.4–76.8)
NEUTROS ABS: 3.5 10*3/uL (ref 1.5–6.5)
Platelets: 122 10*3/uL — ABNORMAL LOW (ref 145–400)
RBC: 3.36 10*6/uL — AB (ref 3.70–5.45)
RDW: 15.4 % — ABNORMAL HIGH (ref 11.2–14.5)
WBC: 4.9 10*3/uL (ref 3.9–10.3)
lymph#: 0.8 10*3/uL — ABNORMAL LOW (ref 0.9–3.3)

## 2017-05-30 MED ORDER — POTASSIUM CHLORIDE CRYS ER 20 MEQ PO TBCR: EXTENDED_RELEASE_TABLET | ORAL | 3 refills | Status: DC

## 2017-05-31 ENCOUNTER — Ambulatory Visit: Payer: Medicare Other | Admitting: Radiation Oncology

## 2017-05-31 ENCOUNTER — Ambulatory Visit: Payer: Medicare Other

## 2017-05-31 ENCOUNTER — Ambulatory Visit
Admission: RE | Admit: 2017-05-31 | Discharge: 2017-05-31 | Disposition: A | Discharge status: Home / Self Care | Payer: Medicare Other | Source: Ambulatory Visit | Attending: Radiation Oncology | Admitting: Radiation Oncology

## 2017-05-31 ENCOUNTER — Telehealth: Payer: Self-pay | Admitting: Medical

## 2017-05-31 DIAGNOSIS — C50212 Malignant neoplasm of upper-inner quadrant of left female breast: Secondary | ICD-10-CM | POA: Diagnosis not present

## 2017-05-31 DIAGNOSIS — Z170 Estrogen receptor positive status [ER+]: Secondary | ICD-10-CM | POA: Diagnosis not present

## 2017-05-31 DIAGNOSIS — J449 Chronic obstructive pulmonary disease, unspecified: Secondary | ICD-10-CM | POA: Diagnosis not present

## 2017-05-31 DIAGNOSIS — I509 Heart failure, unspecified: Secondary | ICD-10-CM | POA: Diagnosis not present

## 2017-05-31 DIAGNOSIS — F419 Anxiety disorder, unspecified: Secondary | ICD-10-CM | POA: Diagnosis not present

## 2017-05-31 DIAGNOSIS — I110 Hypertensive heart disease with heart failure: Secondary | ICD-10-CM | POA: Diagnosis not present

## 2017-05-31 NOTE — Telephone Encounter (Signed)
No 12/17 los °

## 2017-05-31 NOTE — Progress Notes (Signed)
[facility] Progress Note   [name] [id number] [birth year] 46 y.o.  Vallery Sa is managed by Dr. Jana Hakim  Actively treated with chemotherapy: no  Current Therapy: The patient is currently receiving radiation therapy.  Last Treated: 05/30/2017  Assessment: Plan:    Acute on chronic systolic CHF (congestive heart failure) (HCC)  Hypokalemia   Acute on chronic systolic congestive heart failure: [name] continues on torsemide 40 mg daily.  She was directed to restart her metolazone 2.5 mg today.  She takes this as needed for edema and flares of congestive heart failure.  I instructed her to take a dose today and again tomorrow and to follow her weight.  She was told to contact her cardiologist for an appointment later this week if she was not beginning to feel better or was not diuresing adequately by Wednesday.  Hypokalemia: A chemistry panel returned today showing a potassium of 3.1.  [name] is currently on K-dur 40 mEq daily.  I instructed her to increase her K-dur to 60 mEq daily for the next week.  Please see After Visit Summary for patient specific instructions.  Future Appointments  Date Time Provider [place]  05/31/2017 11:15 AM [facility] 3 [facility] None  06/01/2017 11:15 AM [facility] 3 [facility] None  06/02/2017 11:15 AM [facility] 3 [facility] None  06/03/2017 11:15 AM [facility] 3 [facility] None  06/06/2017 11:15 AM [facility] 3 [facility] None  06/08/2017 11:15 AM [facility] 3 [facility] None  07/06/2017 10:30 AM Ward Givens, NP [facility] None  07/29/2017 11:00 AM Bensimhon, Shaune Pascal, MD [facility] None  09/20/2017 11:30 AM [facility] LAB 6 [facility] None  09/20/2017 12:00 PM Magrinat, Virgie Dad, MD [facility] None    No orders of the defined types were placed in this encounter.      Subjective:   Patient ID:  [name] is a 46 y.o. (DOB 1971/03/23) female.  Chief  Complaint:  Chief Complaint  Patient presents with  . Shortness of Breath    HPI [name] is a 46 year old female with a history of a right-sided breast cancer in approximately 2008.  She is most recently been diagnosed with a left-sided breast cancer which dates to June 2018.  She is currently receiving radiation therapy to her left breast which is to be completed on 06/03/2017.  She is also on letrozole which she started on 02/01/2017.  The patient's medical history is compounded by her severe cardiomyopathy.  Her most recent echocardiogram completed on 10/28/2016 returned with an LVEF of 20-25%.  She continues to see Dr. Glori Bickers who is her primary cardiologist.  She is scheduled to see him in follow-up in February.  She is on furosemide 40 mg daily and takes metolazone as needed for exacerbations of edema.  She was seen in radiation therapy earlier today and reported increasing shortness of breath and increasing fatigue over the past week.  She denies a cough.  She reports having some spotting yesterday.  She had a hysterectomy in 2006.  She also reports some mild epigastric pain belches and has some sensation of fullness when she is eating.  Of note she has had a 10 pound weight increase since 05/17/2017.  She reports that her daughter had her 16th birthday recently.  She has eaten out and eaten different foods recently.  The patient also is on potassium supplement at two 20 mEq tablets daily.  Her potassium today was 3.1.  She is not taken her metolazone  despite having noted an increase in her weight.  Medications: I have reviewed the patient's current medications.  Allergies:  Allergies  Allergen Reactions  . Ace Inhibitors Other (See Comments)    hyperkalemia  . Reglan [Metoclopramide Hcl] Anxiety  . Adhesive [Tape] Itching and Rash    Specifically tegaderm, normal adhesive tape is ok    Past Medical History:  Diagnosis Date  . Acne   . AICD (automatic  cardioverter/defibrillator) present   . Anemia 1980's   [id number]  . Anginal pain ([place])    sporatic , been going on for yrs,  . Anxiety   . Arthritis   . 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 Blood transfusion 1980's   1987 or 1988  . Breast calcifications    right breast  . Breast cancer (HCC)    right;  . CHF (congestive heart failure) ([place])    due to non-ischemic cardiomyopathy, thought to be chemotherapy induced;  cath 7/12: normal cors, EF 20-25%. Cardiac MRI 05/2011 EF 32%. ICD implantation 10/2011 ([employer])  . 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 Conversion disorder   . 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 Depression   . Endometriosis   . Family history of colon cancer   . Family history of ovarian cancer   . Fibromyalgia   . GERD (gastroesophageal reflux disease)   . Headache   . Hepatomegaly   . History of stomach ulcers   . Hyperlipidemia   . Hypertension    c/b orthostatic hypotention  . ICD (implantable cardiac defibrillator) in place   . Insomnia   . Interstitial cystitis   . Left eye injury 12/2014  . 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   . Nonischemic cardiomyopathy ([place])    related to chemo; EF 30% 05/2011  . Obesity   . Palpitation    normal sinus rhythm only on 21 day heart monitor  . Panic attacks   . Peripheral neuropathy    chemo- induced  . Pneumonia 2000's   "once"  . Seasonal allergies   . Seizures ([place])    last seizure  last week: non elipsey seisure  . Type II diabetes mellitus ([place])     Past Surgical History:  Procedure Laterality Date  . ABDOMINAL HYSTERECTOMY     partial  . BREAST EXCISIONAL BIOPSY     right benign 2012  . BREAST LUMPECTOMY  2008; 2013   right radiation  . BREAST LUMPECTOMY WITH RADIOACTIVE SEED AND SENTINEL LYMPH NODE BIOPSY Left 02/21/2017   Procedure: LEFT BREAST LUMPECTOMY WITH RADIOACTIVE SEED AND LEFT AXILLARY DEEP SENTINEL LYMPH  NODE BIOPSY;  Surgeon: Fanny Skates, MD;  Location: [facility];  Service: General;  Laterality: Left;  . CARDIAC DEFIBRILLATOR PLACEMENT  11/03/11  . CESAREAN SECTION    . CHOLECYSTECTOMY  ~ 2009  . COLONOSCOPY    . ICD REVISION N/A 02/21/2017   Procedure: ICD Revision;  Surgeon: Deboraha Sprang, MD;  Location: [facility];  Service: Cardiovascular;  Laterality: N/A;  . IMPLANTABLE CARDIOVERTER DEFIBRILLATOR IMPLANT N/A 11/03/2011   Procedure: IMPLANTABLE CARDIOVERTER DEFIBRILLATOR IMPLANT;  Surgeon: Deboraha Sprang, MD; MDT   . LAPAROSCOPIC ENDOMETRIOSIS FULGURATION  1990's  . NASAL SINUS SURGERY    . [place] REMOVAL  2010?   left chest; placed in 2008  . TONSILLECTOMY  1980's  . TUBAL LIGATION  1990's    Family History  Problem Relation Age of Onset  . Heart disease Mother   .  Arthritis Mother   . Hypertension Mother   . Stroke Mother   . Fibromyalgia Mother   . Coronary artery disease Unknown   . Heart attack Unknown   . Heart disease Maternal Uncle   . Colon cancer Paternal Uncle        dx over 52  . Heart disease Maternal Grandmother   . Heart attack Maternal Grandmother   . Ovarian cancer Paternal Grandmother   . Fibromyalgia Sister   . Ovarian cancer Paternal Aunt     Social History   Socioeconomic History  . Marital status: Married    Spouse name: [name]  . Number of children: 1  . Years of education: 19  . Highest education level: Not on file  Social Needs  . Financial resource strain: Not on file  . Food insecurity - worry: Not on file  . Food insecurity - inability: Not on file  . Transportation needs - medical: Not on file  . Transportation needs - non-medical: Not on file  Occupational History  . Occupation: Disablity  Tobacco Use  . Smoking status: Never Smoker  . Smokeless tobacco: Never Used  Substance and Sexual Activity  . Alcohol use: Yes    Comment: social drinker   . Drug use: No  . Sexual activity: Not on file  Other Topics  Concern  . Not on file  Social History Narrative   Patient is married [name]) and lives at home with her family.   Patient has one child. A daughter   Patient is right-handed.   Patient has a college education.   Patient drinks some caffeine occasionally, but not everyday.   Regular exercise   She is disabled   02/23/2016   Updated    Past Medical History, Surgical history, Social history, and Family history were reviewed and updated as appropriate.   Please see review of systems for further details on the patient's review from today.   Review of Systems:  Review of Systems  Constitutional: Positive for fever and unexpected weight change.  Respiratory: Positive for shortness of breath. Negative for cough, choking and chest tightness.   Cardiovascular: Negative for chest pain, palpitations and leg swelling.  Gastrointestinal: Positive for abdominal distention and abdominal pain (Mid-epigastric discomfort after eating). Negative for constipation, diarrhea and nausea.       Belching  Genitourinary: Positive for vaginal bleeding (spotting yesterday).    Objective:   Physical Exam:  BP 93/62 (BP Location: Left Arm, Patient Position: Sitting)   Pulse 68   Temp 98.1 F (36.7 C) (Oral)   Resp 18   Ht 5\' 7"  (1.702 m)   Wt 266 lb 6.4 oz (120.8 kg)   SpO2 97%   BMI 41.72 kg/m  ECOG: 1  Physical Exam  Constitutional: No distress.  HENT:  Head: Normocephalic and atraumatic.  Mouth/Throat: No oropharyngeal exudate.  Eyes: Right eye exhibits no discharge. Left eye exhibits no discharge. No scleral icterus.  Neck: Normal range of motion. Neck supple.  Cardiovascular: Normal rate, regular rhythm and normal heart sounds. Exam reveals no gallop and no friction rub.  No murmur heard. No presacral edema  Pulmonary/Chest: Effort normal and breath sounds normal. No respiratory distress. She has no wheezes. She has no rales.  Abdominal: Soft. Bowel sounds are normal. She exhibits  distension (Mild abdominal distention). There is no tenderness. There is no rebound and no guarding.  Musculoskeletal: She exhibits no edema.  Lymphadenopathy:    She has no cervical adenopathy.  Neurological:  She is alert. Coordination (The patient was ambulating with the use of a wheelchair.) abnormal.  Skin: Skin is warm and dry. No rash noted. She is not diaphoretic. No erythema.    Lab Review:     Component Value Date/Time   NA 141 05/30/2017 1314   K 3.1 (L) 05/30/2017 1314   CL 99 (L) 05/09/2017 1540   CO2 28 05/30/2017 1314   GLUCOSE 113 05/30/2017 1314   BUN 13.4 05/30/2017 1314   CREATININE 1.0 05/30/2017 1314   CALCIUM 8.8 05/30/2017 1314   PROT 7.1 05/30/2017 1314   ALBUMIN 3.4 (L) 05/30/2017 1314   AST 15 05/30/2017 1314   ALT 15 05/30/2017 1314   ALKPHOS 102 05/30/2017 1314   BILITOT 0.62 05/30/2017 1314   GFRNONAA >60 05/09/2017 1540   GFRAA >60 05/09/2017 1540       Component Value Date/Time   WBC 4.9 05/30/2017 1314   WBC 12.6 (H) 02/15/2017 1257   RBC 3.36 (L) 05/30/2017 1314   RBC 4.74 02/15/2017 1257   HGB 11.4 (L) 05/30/2017 1314   HCT 35.0 05/30/2017 1314   PLT 122 (L) 05/30/2017 1314   PLT 170 03/02/2017 1119   MCV 104.2 (H) 05/30/2017 1314   MCH 34.0 05/30/2017 1314   MCH 30.6 02/15/2017 1257   MCHC 32.7 05/30/2017 1314   MCHC 32.4 02/15/2017 1257   RDW 15.4 (H) 05/30/2017 1314   LYMPHSABS 0.8 (L) 05/30/2017 1314   MONOABS 0.6 05/30/2017 1314   EOSABS 0.0 05/30/2017 1314   EOSABS 0.5 (H) 03/02/2017 1119   BASOSABS 0.0 05/30/2017 1314   -------------------------------  Imaging from last 24 hours (if applicable):  Radiology interpretation: No results found.

## 2017-06-01 ENCOUNTER — Ambulatory Visit
Admission: RE | Admit: 2017-06-01 | Discharge: 2017-06-01 | Disposition: A | Discharge status: Home / Self Care | Payer: Medicare Other | Source: Ambulatory Visit | Attending: Radiation Oncology | Admitting: Radiation Oncology

## 2017-06-01 ENCOUNTER — Ambulatory Visit: Payer: Medicare Other

## 2017-06-01 ENCOUNTER — Encounter: Payer: Self-pay | Admitting: Radiation Oncology

## 2017-06-01 DIAGNOSIS — Z170 Estrogen receptor positive status [ER+]: Principal | ICD-10-CM

## 2017-06-01 DIAGNOSIS — F419 Anxiety disorder, unspecified: Secondary | ICD-10-CM | POA: Diagnosis not present

## 2017-06-01 DIAGNOSIS — I509 Heart failure, unspecified: Secondary | ICD-10-CM | POA: Diagnosis not present

## 2017-06-01 DIAGNOSIS — J449 Chronic obstructive pulmonary disease, unspecified: Secondary | ICD-10-CM | POA: Diagnosis not present

## 2017-06-01 DIAGNOSIS — C50212 Malignant neoplasm of upper-inner quadrant of left female breast: Secondary | ICD-10-CM

## 2017-06-01 DIAGNOSIS — I110 Hypertensive heart disease with heart failure: Secondary | ICD-10-CM | POA: Diagnosis not present

## 2017-06-02 ENCOUNTER — Ambulatory Visit: Payer: Medicare Other

## 2017-06-02 ENCOUNTER — Ambulatory Visit
Admission: RE | Admit: 2017-06-02 | Discharge: 2017-06-02 | Disposition: A | Discharge status: Home / Self Care | Payer: Medicare Other | Source: Ambulatory Visit | Attending: Radiation Oncology | Admitting: Radiation Oncology

## 2017-06-02 DIAGNOSIS — I110 Hypertensive heart disease with heart failure: Secondary | ICD-10-CM | POA: Diagnosis not present

## 2017-06-02 DIAGNOSIS — I509 Heart failure, unspecified: Secondary | ICD-10-CM | POA: Diagnosis not present

## 2017-06-02 DIAGNOSIS — J449 Chronic obstructive pulmonary disease, unspecified: Secondary | ICD-10-CM | POA: Diagnosis not present

## 2017-06-02 DIAGNOSIS — Z170 Estrogen receptor positive status [ER+]: Secondary | ICD-10-CM | POA: Diagnosis not present

## 2017-06-02 DIAGNOSIS — C50212 Malignant neoplasm of upper-inner quadrant of left female breast: Secondary | ICD-10-CM | POA: Diagnosis not present

## 2017-06-02 DIAGNOSIS — F419 Anxiety disorder, unspecified: Secondary | ICD-10-CM | POA: Diagnosis not present

## 2017-06-03 ENCOUNTER — Ambulatory Visit: Payer: Medicare Other

## 2017-06-03 ENCOUNTER — Ambulatory Visit
Admission: RE | Admit: 2017-06-03 | Discharge: 2017-06-03 | Disposition: A | Discharge status: Home / Self Care | Payer: Medicare Other | Source: Ambulatory Visit | Attending: Radiation Oncology | Admitting: Radiation Oncology

## 2017-06-03 DIAGNOSIS — J449 Chronic obstructive pulmonary disease, unspecified: Secondary | ICD-10-CM | POA: Diagnosis not present

## 2017-06-03 DIAGNOSIS — Z170 Estrogen receptor positive status [ER+]: Secondary | ICD-10-CM | POA: Diagnosis not present

## 2017-06-03 DIAGNOSIS — F419 Anxiety disorder, unspecified: Secondary | ICD-10-CM | POA: Diagnosis not present

## 2017-06-03 DIAGNOSIS — I509 Heart failure, unspecified: Secondary | ICD-10-CM | POA: Diagnosis not present

## 2017-06-03 DIAGNOSIS — I110 Hypertensive heart disease with heart failure: Secondary | ICD-10-CM | POA: Diagnosis not present

## 2017-06-03 DIAGNOSIS — C50212 Malignant neoplasm of upper-inner quadrant of left female breast: Secondary | ICD-10-CM | POA: Diagnosis not present

## 2017-06-04 ENCOUNTER — Ambulatory Visit: Payer: Medicare Other

## 2017-06-06 ENCOUNTER — Ambulatory Visit: Payer: Medicare Other

## 2017-06-06 ENCOUNTER — Ambulatory Visit
Admission: RE | Admit: 2017-06-06 | Discharge: 2017-06-06 | Disposition: A | Discharge status: Home / Self Care | Payer: Medicare Other | Source: Ambulatory Visit | Attending: Radiation Oncology | Admitting: Radiation Oncology

## 2017-06-06 DIAGNOSIS — F419 Anxiety disorder, unspecified: Secondary | ICD-10-CM | POA: Diagnosis not present

## 2017-06-06 DIAGNOSIS — Z170 Estrogen receptor positive status [ER+]: Secondary | ICD-10-CM | POA: Diagnosis not present

## 2017-06-06 DIAGNOSIS — I110 Hypertensive heart disease with heart failure: Secondary | ICD-10-CM | POA: Diagnosis not present

## 2017-06-06 DIAGNOSIS — J449 Chronic obstructive pulmonary disease, unspecified: Secondary | ICD-10-CM | POA: Diagnosis not present

## 2017-06-06 DIAGNOSIS — C50212 Malignant neoplasm of upper-inner quadrant of left female breast: Secondary | ICD-10-CM | POA: Diagnosis not present

## 2017-06-06 DIAGNOSIS — I509 Heart failure, unspecified: Secondary | ICD-10-CM | POA: Diagnosis not present

## 2017-06-08 ENCOUNTER — Ambulatory Visit: Payer: Medicare Other

## 2017-06-08 ENCOUNTER — Encounter: Payer: Self-pay | Admitting: Internal Medicine

## 2017-06-08 ENCOUNTER — Ambulatory Visit
Admission: RE | Admit: 2017-06-08 | Discharge: 2017-06-08 | Disposition: A | Discharge status: Home / Self Care | Payer: Medicare Other | Source: Ambulatory Visit | Attending: Radiation Oncology | Admitting: Radiation Oncology

## 2017-06-08 ENCOUNTER — Encounter: Payer: Self-pay | Admitting: Radiation Oncology

## 2017-06-08 DIAGNOSIS — F419 Anxiety disorder, unspecified: Secondary | ICD-10-CM | POA: Diagnosis not present

## 2017-06-08 DIAGNOSIS — C50212 Malignant neoplasm of upper-inner quadrant of left female breast: Secondary | ICD-10-CM | POA: Diagnosis not present

## 2017-06-08 DIAGNOSIS — Z170 Estrogen receptor positive status [ER+]: Principal | ICD-10-CM

## 2017-06-08 DIAGNOSIS — J449 Chronic obstructive pulmonary disease, unspecified: Secondary | ICD-10-CM | POA: Diagnosis not present

## 2017-06-08 DIAGNOSIS — I110 Hypertensive heart disease with heart failure: Secondary | ICD-10-CM | POA: Diagnosis not present

## 2017-06-08 DIAGNOSIS — I509 Heart failure, unspecified: Secondary | ICD-10-CM | POA: Diagnosis not present

## 2017-06-08 MED ORDER — ALRA NON-METALLIC DEODORANT (RAD-ONC)
1.0000 "application " | Freq: Once | TOPICAL | Status: AC
  Administered 2017-06-08: 1 via TOPICAL

## 2017-06-08 MED ORDER — RADIAPLEXRX EX GEL
Freq: Once | CUTANEOUS | Status: AC
  Administered 2017-06-08: 09:00:00 via TOPICAL

## 2017-06-10 NOTE — Progress Notes (Signed)
  Radiation Oncology         [phone]) [phone] ________________________________  Name: [name] MRN: [id number]  Date: 06/08/2017  DOB: 06-04-1971  End of Treatment Note  Diagnosis:   46 y.o. female with [id number] Stage I Invasive Ductal Carcinoma of the left breast, ER 100% positive/ PR 0% (NEG)/ Her2 (NEG), Ki67 (10%), Grade 2     Indication for treatment:  Curative       Radiation treatment dates:   05/09/2017 - 06/08/2017  Site/dose:   The left breast was treated to 40.05 Gy in 15 fractions of 2.67 Gy, followed by a 10 Gy boost in 5 fractions to yield a total dose of 50.05 Gy to the left breast.  Beams/energy:   3D // 10X, 6X Photon Boost: 3D // 15E Photon  Narrative: The patient tolerated radiation treatment relatively well.   She experienced moderate fatigue. She reported soreness and irritation to her left axilla. Her left breast and axilla were hyperpigmented. She is using Radiaplex twice daily and was provided with more Radiaplex and an Alra deodorant.   Plan: The patient has completed radiation treatment. She knows to follow skin care instructions and to continue taking antidiuretics as instructed by the physician(s) who manage her CHF. The patient will return to radiation oncology clinic for routine followup in one month. I advised them to call or return sooner if they have any questions or concerns related to their recovery or treatment.  -----------------------------------  Eppie Gibson, MD  This document serves as a record of services personally performed by Eppie Gibson, MD. It was created on her behalf by Rae Lips, a trained medical scribe. The creation of this record is based on the scribe's personal observations and the provider's statements to them. This document has been checked and approved by the attending provider.

## 2017-06-29 ENCOUNTER — Other Ambulatory Visit: Payer: Self-pay | Admitting: Internal Medicine

## 2017-06-30 ENCOUNTER — Ambulatory Visit (INDEPENDENT_AMBULATORY_CARE_PROVIDER_SITE_OTHER): Payer: Medicare Other | Admitting: Family Medicine

## 2017-06-30 ENCOUNTER — Encounter: Payer: Self-pay | Admitting: Family Medicine

## 2017-06-30 VITALS — BP 136/78 | HR 80 | Ht 67.0 in | Wt 269.3 lb

## 2017-06-30 DIAGNOSIS — E1159 Type 2 diabetes mellitus with other circulatory complications: Secondary | ICD-10-CM | POA: Diagnosis not present

## 2017-06-30 DIAGNOSIS — E1149 Type 2 diabetes mellitus with other diabetic neurological complication: Secondary | ICD-10-CM

## 2017-06-30 DIAGNOSIS — E559 Vitamin D deficiency, unspecified: Secondary | ICD-10-CM | POA: Diagnosis not present

## 2017-06-30 DIAGNOSIS — I10 Essential (primary) hypertension: Secondary | ICD-10-CM | POA: Diagnosis not present

## 2017-06-30 LAB — POCT GLYCOSYLATED HEMOGLOBIN (HGB A1C): Hemoglobin A1C: 5.6

## 2017-06-30 NOTE — Progress Notes (Signed)
Impression and Recommendations:    1. Type 2 diabetes mellitus- diet controlled ([place])   2. Hypertension associated with diabetes ([place])   3. Vitamin D deficiency   4. Obesity, Class III, BMI 40-49.9 (morbid obesity) ([place])      1. DM2: From 03-02-2017, her A1c was: 6.9. Today it is: 5.6. Continue taking your medications as prescribed below. Instructed pt to continue taking her fasting blood sugars in the morning. Instructed pt to start taking her blood sugar 2 hours after eating her largest meal of the day. Dietary and exercise guidelines. Discussed referral to diabetic nutritionist to which pt agreed.  2. HTN associated with DM2: Continue taking your medications as listed below. Continue checking your BP at home.  3. Vitamin D deficiency: continue taking your supplements as described below.  4. Obesity: Discussed importance of lowering BMI and losing weight as a way to reduce risk of DM. Pt will be referred to diabetic nutritionist to encourage weight loss.  Follow up in 4 months to recheck A1c and management of DM and chronic care.    Education and routine counseling performed. Handouts provided.  Orders Placed This Encounter  Procedures  . Ambulatory referral to diabetic education  . POCT glycosylated hemoglobin (Hb A1C)     Return in about 4 months (around 10/28/2017) for 4 mo- DM.   The patient was counseled, risk factors were discussed, anticipatory guidance given.  Gross side effects, risk and benefits, and alternatives of medications discussed with patient.  Patient is aware that all medications have potential side effects and we are unable to predict every side effect or drug-drug interaction that may occur.  Expresses verbal understanding and consents to current therapy plan and treatment regimen.  Please see AVS handed out to patient at the end of our visit for further patient instructions/ counseling done pertaining to today's office visit.    Note: This  document was prepared using Dragon voice recognition software and may include unintentional dictation errors.   This document serves as a record of services personally performed by Mellody Dance, DO. It was created on her behalf by Mayer Masker, a trained medical scribe. The creation of this record is based on the scribe's personal observations and the provider's statements to them.   .I have reviewed the above medical documentation for accuracy and completeness and I concur.  Mellody Dance 06/30/17 5:07 PM     Subjective:    Chief Complaint  Patient presents with  . Follow-up     [name] is a 47 y.o. female who presents to [facility] today for Diabetes Management.  Pt has a PMHx of CHF, DM, and breast cancer. She states she always takes a fasting blood sugar but reports difficulty with doing the 2 hour postprandial checks.  She has been tracking her calories, but she lost her phone and lost "everything"  Including her food counting apps.  She has been drinking green smoothies.  DM HPI: -  She has been working on diet and exercise for diabetes.  Pt is currently maintained on the following medications for diabetes: see med list today Medication compliance - pt is compliant.  Home glucose readings range: She states her blood sugars have been between 100-130.    Denies any new polyuria/polydipsia. She takes a fluid pill for her CHF. Denies hypo/ hyperglycemia symptoms - She denies new onset of: chest pain, exercise intolerance, shortness of breath, dizziness, visual changes, headache, lower extremity  swelling or claudication.   Pt is also complaining of fatigue and nausea as a side effect of her radiation. She is being watched closely by her oncologist.    Last diabetic eye exam was  Lab Results  Component Value Date   HMDIABEYEEXA No Retinopathy 03/31/2017    Foot exam- UTD  Last A1C in the office was:  Lab Results  Component  Value Date   HGBA1C 5.6 06/30/2017   HGBA1C 6.9 (H) 03/02/2017   HGBA1C 6.1 (H) 02/15/2017    Lab Results  Component Value Date   LDLCALC 100 (H) 03/02/2017   CREATININE 1.0 05/30/2017      Last 3 blood pressure readings in our office are as follows: BP Readings from Last 3 Encounters:  06/30/17 136/78  05/30/17 93/62  05/17/17 94/69    BMI Readings from Last 3 Encounters:  06/30/17 42.18 kg/m  05/30/17 41.72 kg/m  05/17/17 40.22 kg/m     Problem  Obesity, Class III, Bmi 40-49.9 (Morbid Obesity) (Hcc)   Qualifier: Diagnosis of  By: Peri Maris         Patient Care Team    Relationship Specialty Notifications Start End  Mellody Dance, DO PCP - General Family Medicine  01/21/17   Bensimhon, Shaune Pascal, MD Consulting Physician Cardiology  07/08/14   Magrinat, Virgie Dad, MD Consulting Physician Oncology  07/08/14   Deboraha Sprang, MD Consulting Physician Cardiology  07/08/14   Dohmeier, Asencion Partridge, MD Consulting Physician Neurology  07/08/14   Chesley Mires, MD Consulting Physician Pulmonary Disease  07/08/14   Fanny Skates, MD Consulting Physician General Surgery  12/28/16   Eppie Gibson, MD Attending Physician Radiation Oncology  12/28/16   Latanya Maudlin, MD Consulting Physician Orthopedic Surgery  02/07/17   Domingo Pulse, MD Consulting Physician Urology  02/07/17    Comment: Interstitial cystitis  Ena Dawley, MD Consulting Physician Obstetrics and Gynecology  03/22/17      Patient Active Problem List   Diagnosis Date Noted  . Malignant neoplasm of upper-inner quadrant of left breast in female, estrogen receptor positive ([place]) 12/22/2016    Priority: High  . Vitamin D deficiency 08/19/2011    Priority: High  . Diabetes mellitus, type II ([place]) 03/24/2011    Priority: High  . Hypertension associated with diabetes ([place]) 04/29/2008    Priority: High  . Convulsions/seizures ([place]) 12/25/2014    Priority: Medium  . Acute on chronic systolic CHF  (congestive heart failure) ([place]) 11/27/2014    Priority: Medium  . Physical deconditioning 06/24/2014    Priority: Medium  . Fibromyalgia affecting multiple sites 05/29/2014    Priority: Medium  . Systolic heart failure ([place]) 08/25/2008    Priority: Medium  . Obesity, Class III, BMI 40-49.9 (morbid obesity) ([place]) 04/29/2008    Priority: Medium  . Family history of ovarian cancer     Priority: Low  . Family history of colon cancer     Priority: Low  . Generalized anxiety disorder 07/08/2014    Priority: Low  . GERD (gastroesophageal reflux disease) 12/09/2010    Priority: Low  . Hyperlipidemia associated with type 2 diabetes mellitus ([place]) 03/22/2017  . Adjustment disorder with mixed anxiety and depressed mood 03/22/2017  . Left breast mass- path c/w CA- Dr Dalbert Batman 02/07/2017  . Fibromyalgia syndrome 02/07/2017  . S/P placement of cardiac pacemaker 02/07/2017  . Genetic testing 02/03/2017  . Malignant neoplasm of overlapping sites of right breast in female, estrogen receptor positive ([place]) 12/28/2016  . History of  malignant neoplasm of breast 09/01/2015  . Conversion disorder with abnormal movement 12/25/2014  . Myoclonus 12/20/2014  . Acute renal insufficiency 11/28/2014  . ICD in place (MDT 2013) 11/27/2014  . Near syncope 11/26/2014  . Stomach discomfort 10/14/2014  . Chronic pain not due to malignancy 05/29/2014  . Dyspnea 01/07/2014  . Chronic sinusitis 01/07/2014  . Conversion disorder 09/03/2013  . Neuropathy due to drug ([place]) 04/27/2013  . 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 Uncontrolled persistent asthma 11/15/2012  . Orthostatic hypotension 05/18/2012  . Keloid 03/14/2012  . Single implantable cardioverter-defibrillator in situ 11/03/2011  . Weakness 07/15/2011  . Chest pain 12/09/2010  . Secondary cardiomyopathy s/p chemo therapy 04/01/2009  . FATIGUE / MALAISE 10/02/2008  . DIFFICULTY IN [facility] [place] [facility] 04/29/2008  . DIZZINESS  04/29/2008  . NUMBNESS 04/29/2008  . EDEMA 04/29/2008  . ABNORMAL WEIGHT GAIN 04/29/2008  . PALPITATIONS 04/29/2008  . ORTHOPNEA 04/29/2008  . DYSPNEA 04/29/2008  . COUGH 04/29/2008     Past Medical History:  Diagnosis Date  . Acne   . AICD (automatic cardioverter/defibrillator) present   . Anemia 1980's   [id number]  . Anginal pain ([place])    sporatic , been going on for yrs,  . Anxiety   . Arthritis   . 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 Blood transfusion 1980's   1987 or 1988  . Breast calcifications    right breast  . Breast cancer (HCC)    right;  . CHF (congestive heart failure) ([place])    due to non-ischemic cardiomyopathy, thought to be chemotherapy induced;  cath 7/12: normal cors, EF 20-25%. Cardiac MRI 05/2011 EF 32%. ICD implantation 10/2011 ([employer])  . 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 Conversion disorder   . 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 Depression   . Endometriosis   . Family history of colon cancer   . Family history of ovarian cancer   . Fibromyalgia   . GERD (gastroesophageal reflux disease)   . Headache   . Hepatomegaly   . History of stomach ulcers   . Hyperlipidemia   . Hypertension    c/b orthostatic hypotention  . ICD (implantable cardiac defibrillator) in place   . Insomnia   . Interstitial cystitis   . Left eye injury 12/2014  . 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   . Nonischemic cardiomyopathy ([place])    related to chemo; EF 30% 05/2011  . Obesity   . Palpitation    normal sinus rhythm only on 21 day heart monitor  . Panic attacks   . Peripheral neuropathy    chemo- induced  . Pneumonia 2000's   "once"  . Seasonal allergies   . Seizures ([place])    last seizure  last week: non elipsey seisure  . Type II diabetes mellitus ([place])      Past Surgical History:  Procedure Laterality Date  . ABDOMINAL HYSTERECTOMY     partial  . BREAST EXCISIONAL  BIOPSY     right benign 2012  . BREAST LUMPECTOMY  2008; 2013   right radiation  . BREAST LUMPECTOMY WITH RADIOACTIVE SEED AND SENTINEL LYMPH NODE BIOPSY Left 02/21/2017   Procedure: LEFT BREAST LUMPECTOMY WITH RADIOACTIVE SEED AND LEFT AXILLARY DEEP SENTINEL LYMPH NODE BIOPSY;  Surgeon: Fanny Skates, MD;  Location: [facility];  Service: General;  Laterality: Left;  . CARDIAC DEFIBRILLATOR PLACEMENT  11/03/11  . CESAREAN SECTION    . CHOLECYSTECTOMY  ~  2009  . COLONOSCOPY    . ICD REVISION N/A 02/21/2017   Procedure: ICD Revision;  Surgeon: Deboraha Sprang, MD;  Location: [facility];  Service: Cardiovascular;  Laterality: N/A;  . IMPLANTABLE CARDIOVERTER DEFIBRILLATOR IMPLANT N/A 11/03/2011   Procedure: IMPLANTABLE CARDIOVERTER DEFIBRILLATOR IMPLANT;  Surgeon: Deboraha Sprang, MD; MDT   . LAPAROSCOPIC ENDOMETRIOSIS FULGURATION  1990's  . NASAL SINUS SURGERY    . [place] REMOVAL  2010?   left chest; placed in 2008  . TONSILLECTOMY  1980's  . TUBAL LIGATION  1990's     Family History  Problem Relation Age of Onset  . Heart disease Mother   . Arthritis Mother   . Hypertension Mother   . Stroke Mother   . Fibromyalgia Mother   . Coronary artery disease Unknown   . Heart attack Unknown   . Heart disease Maternal Uncle   . Colon cancer Paternal Uncle        dx over 46  . Heart disease Maternal Grandmother   . Heart attack Maternal Grandmother   . Ovarian cancer Paternal Grandmother   . Fibromyalgia Sister   . Ovarian cancer Paternal Aunt      Social History   Substance and Sexual Activity  Drug Use No  ,  Social History   Substance and Sexual Activity  Alcohol Use Yes   Comment: social drinker   ,  Social History   Tobacco Use  Smoking Status Never Smoker  Smokeless Tobacco Never Used  ,    Current Outpatient Medications on File Prior to Visit  Medication Sig Dispense Refill  . acetaminophen (TYLENOL) 500 MG tablet Take 1,000 mg by mouth 2 (two) times  daily as needed for moderate pain.    Marland Kitchen albuterol (PROVENTIL HFA;VENTOLIN HFA) 108 (90 Base) MCG/ACT inhaler Inhale 1-2 puffs into the lungs every 6 (six) hours as needed for wheezing or shortness of breath.     . allopurinol (ZYLOPRIM) 300 MG tablet Take 300 mg by mouth daily.  2  . baclofen (LIORESAL) 10 MG tablet TAKE 1 TABLET BY MOUTH 2 (TWO) TIMES DAILY AS NEEDED FOR MUSCLE SPASMS. 60 tablet 0  . BLACK CURRANT SEED OIL PO Take 15 mLs by mouth daily.     . Blood Glucose Monitoring Suppl (FREESTYLE FREEDOM LITE) w/Device KIT Check fasting and 2 hour PP 1 each 1  . carvedilol (COREG) 25 MG tablet Take 0.5 tablets (12.5 mg total) by mouth 2 (two) times daily with a meal. 60 tablet 6  . Cholecalciferol (VITAMIN D3) 5000 UNITS CAPS Take 5,000 Units by mouth daily.     . Coenzyme Q10 (CO Q-10) 100 MG CAPS Take 100 mg by mouth daily.    . Cyanocobalamin 2500 MCG SUBL Place 2,500 mcg under the tongue daily.    . digoxin (LANOXIN) 0.125 MG tablet TAKE 0.5 TABLETS (0.0625 MG TOTAL) BY MOUTH DAILY. 15 tablet 2  . divalproex (DEPAKOTE ER) 500 MG 24 hr tablet Take 1 tablet in a.m. and 2 tablets in p.m. 90 tablet 5  . DULoxetine (CYMBALTA) 30 MG capsule Take 1 capsule (30 mg total) by mouth 2 (two) times daily. 60 capsule 5  . ferrous sulfate 325 (65 FE) MG tablet Take 325 mg by mouth daily with breakfast.    . FREESTYLE UNISTICK II LANCETS MISC Check fasting blood sugar in the morning and check sugar after largest meal of the day. 100 each 12  . GLUCOSAMINE-CHONDROITIN PO Take 1,200 mg by mouth  daily.    . glucose blood (FREESTYLE TEST STRIPS) test strip To be filled with Freedom Lite test strips.   Check fasting blood sugar in the morning and check sugar after largest meal of the day. 100 each 12  . hyaluronate sodium (RADIAPLEXRX) GEL Apply 1 application topically once.    . hydrOXYzine (ATARAX/VISTARIL) 25 MG tablet Take 25 mg by mouth at bedtime as needed for itching (DOESN'T TAKE WITH TRAZODONE).    Marland Kitchen  ivabradine (CORLANOR) 5 MG TABS tablet Take 1.5 tablets (7.5 mg total) 2 (two) times daily with a meal by mouth. 90 tablet 3  . letrozole (FEMARA) 2.5 MG tablet Take 1 tablet (2.5 mg total) by mouth daily. 90 tablet 4  . loratadine (CLARITIN) 10 MG tablet Take 10 mg by mouth daily.    Marland Kitchen losartan (COZAAR) 25 MG tablet Take 1 tablet (25 mg total) by mouth daily. 90 tablet 3  . Menthol, Topical Analgesic, (ICY HOT EX) Apply 1 application topically daily as needed (muscle pain).    Marland Kitchen metolazone (ZAROXOLYN) 2.5 MG tablet TAKE 1 TABLET (2.5 MG TOTAL) BY MOUTH DAILY AS NEEDED (EDEMA). 15 tablet 0  . Milk Thistle 150 MG CAPS Take 450 mg by mouth daily.     . Multiple Vitamin (MULTIVITAMIN WITH MINERALS) TABS Take 1 tablet by mouth daily. Reported on 0/01/6577    . non-metallic deodorant Jethro Poling) MISC Apply 1 application topically daily as needed.    Marland Kitchen omeprazole (PRILOSEC) 40 MG capsule TAKE 1 CAPSULE (40 MG TOTAL) BY MOUTH 2 (TWO) TIMES DAILY BEFORE A MEAL. BREAKFAST AND SUPPER 60 capsule 9  . ondansetron (ZOFRAN) 4 MG tablet Take 1 tablet (4 mg total) by mouth every 8 (eight) hours as needed for nausea or vomiting. 20 tablet 3  . PATADAY 0.2 % SOLN Place 1 drop into both eyes daily as needed (allergies).     . pentosan polysulfate (ELMIRON) 100 MG capsule Take 100 mg by mouth 2 (two) times daily as needed (bladder spasms).     . potassium chloride SA (K-DUR,KLOR-CON) 20 MEQ tablet Take 2 to 3 tablets daily as directed. 180 tablet 3  . RESTASIS 0.05 % ophthalmic emulsion     . Sodium Chloride-Sodium Bicarb (NETI POT SINUS WASH NA) Place 1 Dose into the nose daily as needed (congestion).    Marland Kitchen spironolactone (ALDACTONE) 25 MG tablet Take 1 tablet (25 mg total) daily by mouth. 90 tablet 3  . torsemide (DEMADEX) 20 MG tablet TAKE 2 TABLETS (40 MG TOTAL) BY MOUTH DAILY. 60 tablet 6  . traZODone (DESYREL) 50 MG tablet Take 50 mg by mouth at bedtime as needed for sleep.     Marland Kitchen triamcinolone cream (KENALOG) 0.1 %  Apply 1 application topically 2 (two) times daily as needed (eczema).    . venlafaxine XR (EFFEXOR-XR) 37.5 MG 24 hr capsule Take 1 capsule (37.5 mg total) by mouth daily with breakfast. 30 capsule 6  . Vitamin D, Ergocalciferol, (DRISDOL) 50000 units CAPS capsule Take 1 capsule (50,000 Units total) by mouth every 7 (seven) days. 12 capsule 3   No current facility-administered medications on file prior to visit.      Allergies  Allergen Reactions  . Ace Inhibitors Other (See Comments)    hyperkalemia  . Reglan [Metoclopramide Hcl] Anxiety  . Adhesive [Tape] Itching and Rash    Specifically tegaderm, normal adhesive tape is ok     Review of Systems:   General:  Denies fever, chills Optho/Auditory:  Denies visual changes, blurred vision Respiratory:   Denies SOB, cough, wheeze, DIB  Cardiovascular:   Denies chest pain, palpitations, painful respirations Gastrointestinal:   Denies nausea, vomiting, diarrhea.  Endocrine:     Denies new hot or cold intolerance Musculoskeletal:  Denies joint swelling, gait issues, or new unexplained myalgias/ arthralgias Skin:  Denies rash, suspicious lesions  Neurological:    Denies dizziness, unexplained weakness, numbness  Psychiatric/Behavioral:   Denies mood changes    Objective:     Blood pressure 136/78, pulse 80, height _0  (1.702 m), weight 269 lb 4.8 oz (122.2 kg), SpO2 97 %.  Body mass index is 42.18 kg/m.  General: Well Developed, well nourished, and in no acute distress.  HEENT: Normocephalic, atraumatic, pupils equal round reactive to light, neck supple, No carotid bruits, no JVD Skin: Warm and dry, cap RF less 2 sec Cardiac: Regular rate and rhythm, S1, S2 WNL's, no murmurs rubs or gallops Respiratory: ECTA B/L, Not using accessory muscles, speaking in full sentences. NeuroM-Sk: Ambulates w/o assistance, moves ext * 4 w/o difficulty, sensation grossly intact.  Ext: scant edema b/l lower ext Psych: No HI/SI, judgement and  insight good, Euthymic mood. Full Affect.

## 2017-06-30 NOTE — Patient Instructions (Signed)
Great job as your A1c 4 months ago was 6.9 and today is 5.6.  This is fantastic.  We are sending you to a diabetic nutritionist who will help teach you more about prudent eating and you can ask them all about weight loss programs.  We will see you in 4 months to recheck the A1c.  Below is some helpful information for you   Diabetes Mellitus and Standards of Medical Care  Managing diabetes (diabetes mellitus) can be complicated. Your diabetes treatment may be managed by a team of health care providers, including:  A diet and nutrition specialist (registered dietitian).  A nurse.  A certified diabetes educator (CDE).  A diabetes specialist (endocrinologist).  An eye doctor.  A primary care provider.  A dentist.  Your health care providers follow a schedule in order to help you get the best quality of care. The following schedule is a general guideline for your diabetes management plan. Your health care providers may also give you more specific instructions.  HbA1c (hemoglobin A1c) test This test provides information about blood sugar (glucose) control over the previous 2-3 months. It is used to check whether your diabetes management plan needs to be adjusted.  If you are meeting your treatment goals, this test is done at least 2 times a year.  If you are not meeting treatment goals or if your treatment goals have changed, this test is done 4 times a year.  Blood pressure test  This test is done at every routine medical visit. For most people, the goal is less than 130/80. Ask your health care provider what your goal blood pressure should be.  Dental and eye exams  Visit your dentist two times a year.  If you have type 1 diabetes, get an eye exam 3-5 years after you are diagnosed, and then once a year after your first exam. ? If you were diagnosed with type 1 diabetes as a child, get an eye exam when you are age 62 or older and have had diabetes for 3-5 years. After the first  exam, you should get an eye exam once a year.  If you have type 2 diabetes, have an eye exam as soon as you are diagnosed, and then once a year after your first exam.  Foot care exam  Visual foot exams are done at every routine medical visit. The exams check for cuts, bruises, redness, blisters, sores, or other problems with the feet.  A complete foot exam is done by your health care provider once a year. This exam includes an inspection of the structure and skin of your feet, and a check of the pulses and sensation in your feet. ? Type 1 diabetes: Get your first exam 3-5 years after diagnosis. ? Type 2 diabetes: Get your first exam as soon as you are diagnosed.  Check your feet every day for cuts, bruises, redness, blisters, or sores. If you have any of these or other problems that are not healing, contact your health care provider.  Kidney function test (urine microalbumin)  This test is done once a year. ? Type 1 diabetes: Get your first test 5 years after diagnosis. ? Type 2 diabetes: Get your first test as soon as you are diagnosed._  If you have chronic kidney disease (CKD), get a serum creatinine and estimated glomerular filtration rate (eGFR) test once a year.  Lipid profile (cholesterol, HDL, LDL, triglycerides)  This test should be done when you are diagnosed with diabetes, and every  5 years after the first test. If you are on medicines to lower your cholesterol, you may need to get this test done every year. ? The goal for LDL is less than 100 mg/dL (5.5 mmol/L). If you are at high risk, the goal is less than 70 mg/dL (3.9 mmol/L). ? The goal for HDL is 40 mg/dL (2.2 mmol/L) for men and 50 mg/dL(2.8 mmol/L) for women. An HDL cholesterol of 60 mg/dL (3.3 mmol/L) or higher gives some protection against heart disease. ? The goal for triglycerides is less than 150 mg/dL (8.3 mmol/L).  Immunizations  The yearly flu (influenza) vaccine is recommended for everyone 6 months or older  who has diabetes.  The pneumonia (pneumococcal) vaccine is recommended for everyone 2 years or older who has diabetes. If you are 44 or older, you may get the pneumonia vaccine as a series of two separate shots.  The hepatitis B vaccine is recommended for adults shortly after they have been diagnosed with diabetes.  The Tdap (tetanus, diphtheria, and pertussis) vaccine should be given: ? According to normal childhood vaccination schedules, for children. ? Every 10 years, for adults who have diabetes.  The shingles vaccine is recommended for people who have had chicken pox and are 50 years or older.  Mental and emotional health  Screening for symptoms of eating disorders, anxiety, and depression is recommended at the time of diagnosis and afterward as needed. If your screening shows that you have symptoms (you have a positive screening result), you may need further evaluation and be referred to a mental health care provider.  Diabetes self-management education  Education about how to manage your diabetes is recommended at diagnosis and ongoing as needed.  Treatment plan  Your treatment plan will be reviewed at every medical visit.  Summary  Managing diabetes (diabetes mellitus) can be complicated. Your diabetes treatment may be managed by a team of health care providers.  Your health care providers follow a schedule in order to help you get the best quality of care.  Standards of care including having regular physical exams, blood tests, blood pressure monitoring, immunizations, screening tests, and education about how to manage your diabetes.  Your health care providers may also give you more specific instructions based on your individual health.      Type 2 Diabetes Mellitus, Self Care, Adult Caring for yourself after you have been diagnosed with type 2 diabetes (type 2 diabetes mellitus) means keeping your blood sugar (glucose) under control with a balance  of:  Nutrition.  Exercise.  Lifestyle changes.  Medicines or insulin, if necessary.  Support from your team of health care providers and others.  The following information explains what you need to know to manage your diabetes at home. What do I need to do to manage my blood glucose?  Check your blood glucose every day, as often as told by your health care provider.  Contact your health care provider if your blood glucose is above your target for 2 tests in a row.  Have your A1c (hemoglobin A1c) level checked at least two times a year, or as often as told by your health care provider. Your health care provider will set individualized treatment goals for you. Generally, the goal of treatment is to maintain the following blood glucose levels:  Before meals (preprandial): 80-130 mg/dL (4.4-7.2 mmol/L).  After meals (postprandial): below 180 mg/dL (10 mmol/L).  A1c level: less than 7%.  What do I need to know about hyperglycemia and hypoglycemia?  What is hyperglycemia? Hyperglycemia, also called high blood glucose, occurs when blood glucose is too high.Make sure you know the early signs of hyperglycemia, such as:  Increased thirst.  Hunger.  Feeling very tired.  Needing to urinate more often than usual.  Blurry vision.  What is hypoglycemia? Hypoglycemia, also called low blood glucose, occurswith a blood glucose level at or below 70 mg/dL (3.9 mmol/L). The risk for hypoglycemia increases during or after exercise, during sleep, during illness, and when skipping meals or not eating for a long time (fasting). It is important to know the symptoms of hypoglycemia and treat it right away. Always have a 15-gram rapid-acting carbohydrate snack with you to treat low blood glucose. Family members and close friends should also know the symptoms and should understand how to treat hypoglycemia, in case you are not able to treat yourself. What are the symptoms of  hypoglycemia? Hypoglycemia symptoms can include:  Hunger.  Anxiety.  Sweating and feeling clammy.  Confusion.  Dizziness or feeling light-headed.  Sleepiness.  Nausea.  Increased heart rate.  Headache.  Blurry vision.  Seizure.  Nightmares.  Tingling or numbness around the mouth, lips, or tongue.  A change in speech.  Decreased ability to concentrate.  A change in coordination.  Restless sleep.  Tremors or shakes.  Fainting.  Irritability.  How do I treat hypoglycemia?  If you are alert and able to swallow safely, follow the 15:15 rule:  Take 15 grams of a rapid-acting carbohydrate. Rapid-acting options include: ? 1 tube of glucose gel. ? 3 glucose pills. ? 6-8 pieces of hard candy. ? 4 oz (120 mL) of fruit juice. ? 4 oz (120 mL) of regular (not diet) soda.  Check your blood glucose 15 minutes after you take the carbohydrate.  If the repeat blood glucose level is still at or below 70 mg/dL (3.9 mmol/L), take 15 grams of a carbohydrate again.  If your blood glucose level does not increase above 70 mg/dL (3.9 mmol/L) after 3 tries, seek emergency medical care.  After your blood glucose level returns to normal, eat a meal or a snack within 1 hour.  How do I treat severe hypoglycemia? Severe hypoglycemia is when your blood glucose level is at or below 54 mg/dL (3 mmol/L). Severe hypoglycemia is an emergency. Do not wait to see if the symptoms will go away. Get medical help right away. Call your local emergency services (911 in the [place] Do not drive yourself to the hospital. If you have severe hypoglycemia and you cannot eat or drink, you may need an injection of glucagon. A family member or close friend should learn how to check your blood glucose and how to give you a glucagon injection. Ask your health care provider if you need to have an emergency glucagon injection kit available. Severe hypoglycemia may need to be treated in a hospital. The treatment  may include getting glucose through an IV tube. You may also need treatment for the cause of your hypoglycemia. Can having diabetes put me at risk for other conditions? Having diabetes can put you at risk for other long-term (chronic) conditions, such as heart disease and kidney disease. Your health care provider may prescribe medicines to help prevent complications from diabetes. These medicines may include:  Aspirin.  Medicine to lower cholesterol.  Medicine to control blood pressure.  What else can I do to manage my diabetes? Take your diabetes medicines as told  If your health care provider prescribed insulin or diabetes medicines,  take them every day.  Do not run out of insulin or other diabetes medicines that you take. Plan ahead so you always have these available.  If you use insulin, adjust your dosage based on how physically active you are and what foods you eat. Your health care provider will tell you how to adjust your dosage. Make healthy food choices  The things that you eat and drink affect your blood glucose and your insulin dosage. Making good choices helps to control your diabetes and prevent other health problems. A healthy meal plan includes eating lean proteins, complex carbohydrates, fresh fruits and vegetables, low-fat dairy products, and healthy fats. Make an appointment to see a diet and nutrition specialist (registered dietitian) to help you create an eating plan that is right for you. Make sure that you:  Follow instructions from your health care provider about eating or drinking restrictions.  Drink enough fluid to keep your urine clear or pale yellow.  Eat healthy snacks between nutritious meals.  Track the carbohydrates that you eat. Do this by reading food labels and learning the standard serving sizes of foods.  Follow your sick day plan whenever you cannot eat or drink as usual. Make this plan in advance with your health care provider.  Stay  active  Exercise regularly, as told by your health care provider. This may include:  Stretching and doing strength exercises, such as yoga or weightlifting, at least 2 times a week.  Doing at least 150 minutes of moderate-intensity or vigorous-intensity exercise each week. This could be brisk walking, biking, or water aerobics. ? Spread out your activity over at least 3 days of the week. ? Do not go more than 2 days in a row without doing some kind of physical activity.  When you start a new exercise or activity, work with your health care provider to adjust your insulin, medicines, or food intake as needed. Make healthy lifestyle choices  Do not use any tobacco products, such as cigarettes, chewing tobacco, and e-cigarettes. If you need help quitting, ask your health care provider.  If your health care provider says that alcohol is safe for you, limit alcohol intake to no more than 1 drink per day for nonpregnant women and 2 drinks per day for men. One drink equals 12 oz of beer, 5 oz of wine, or 1 oz of hard liquor.  Learn to manage stress. If you need help with this, ask your health care provider. Care for your body   Keep your immunizations up to date. In addition to getting vaccinations as told by your health care provider, it is recommended that you get vaccinated against the following illnesses: ? The flu (influenza). Get a flu shot every year. ? Pneumonia. ? Hepatitis B.  Schedule an eye exam soon after your diagnosis, and then one time every year after that.  Check your skin and feet every day for cuts, bruises, redness, blisters, or sores. Schedule a foot exam with your health care provider once every year.  Brush your teeth and gums two times a day, and floss at least one time a day. Visit your dentist at least once every 6 months.  Maintain a healthy weight. General instructions  Take over-the-counter and prescription medicines only as told by your health care  provider.  Share your diabetes management plan with people in your workplace, school, and household.  Check your urine for ketones when you are ill and as told by your health care provider.  Ask  your health care provider: ? Do I need to meet with a diabetes educator? ? Where can I find a support group for people with diabetes?  Carry a medical alert card or wear medical alert jewelry.  Keep all follow-up visits as told by your health care provider. This is important. Where to find more information: For more information about diabetes, visit:  American Diabetes Association (ADA): www.diabetes.org  American Association of Diabetes Educators (AADE): www.diabeteseducator.org/patient-resources  This information is not intended to replace advice given to you by your health care provider. Make sure you discuss any questions you have with your health care provider. Document Released: 09/22/2015 Document Revised: 11/06/2015 Document Reviewed: 07/04/2015 Elsevier Interactive Patient Education  2017 [place].      Blood Glucose Monitoring, Adult Monitoring your blood sugar (glucose) helps you manage your diabetes. It also helps you and your health care provider determine how well your diabetes management plan is working. Blood glucose monitoring involves checking your blood glucose as often as directed, and keeping a record (log) of your results over time. Why should I monitor my blood glucose? Checking your blood glucose regularly can:  Help you understand how food, exercise, illnesses, and medicines affect your blood glucose.  Let you know what your blood glucose is at any time. You can quickly tell if you are having low blood glucose (hypoglycemia) or high blood glucose (hyperglycemia).  Help you and your health care provider adjust your medicines as needed.  When should I check my blood glucose? Follow instructions from your health care provider about how often to check your  blood glucose.   This may depend on:  The type of diabetes you have.  How well-controlled your diabetes is.  Medicines you are taking.  If you have type 1 diabetes:  Check your blood glucose at least 2 times a day.  Also check your blood glucose: ? Before every insulin injection. ? Before and after exercise. ? Between meals. ? 2 hours after a meal. ? Occasionally between 2:00 a.m. and 3:00 a.m., as directed. ? Before potentially dangerous tasks, like driving or using heavy machinery. ? At bedtime.  You may need to check your blood glucose more often, up to 6-10 times a day: ? If you use an insulin pump. ? If you need multiple daily injections (MDI). ? If your diabetes is not well-controlled. ? If you are ill. ? If you have a history of severe hypoglycemia. ? If you have a history of not knowing when your blood glucose is getting low (hypoglycemia unawareness).  If you have type 2 diabetes:  If you take insulin or other diabetes medicines, check your blood glucose at least 2 times a day.  If you are on intensive insulin therapy, check your blood glucose at least 4 times a day. Occasionally, you may also need to check between 2:00 a.m. and 3:00 a.m., as directed.  Also check your blood glucose: ? Before and after exercise. ? Before potentially dangerous tasks, like driving or using heavy machinery.  You may need to check your blood glucose more often if: ? Your medicine is being adjusted. ? Your diabetes is not well-controlled. ? You are ill.  What is a blood glucose log?  A blood glucose log is a record of your blood glucose readings. It helps you and your health care provider: ? Look for patterns in your blood glucose over time. ? Adjust your diabetes management plan as needed.  Every time you check your blood  glucose, write down your result and notes about things that may be affecting your blood glucose, such as your diet and exercise for the day.  Most glucose  meters store a record of glucose readings in the meter. Some meters allow you to download your records to a computer. How do I check my blood glucose? Follow these steps to get accurate readings of your blood glucose: Supplies needed   Blood glucose meter.  Test strips for your meter. Each meter has its own strips. You must use the strips that come with your meter.  A needle to prick your finger (lancet). Do not use lancets more than once.  A device that holds the lancet (lancing device).  A journal or log book to write down your results.  Procedure  Wash your hands with soap and water.  Prick the side of your finger (not the tip) with the lancet. Use a different finger each time.  Gently rub the finger until a small drop of blood appears.  Follow instructions that come with your meter for inserting the test strip, applying blood to the strip, and using your blood glucose meter.  Write down your result and any notes.  Alternative testing sites  Some meters allow you to use areas of your body other than your finger (alternative sites) to test your blood.  If you think you may have hypoglycemia, or if you have hypoglycemia unawareness, do not use alternative sites. Use your finger instead.  Alternative sites may not be as accurate as the fingers, because blood flow is slower in these areas. This means that the result you get may be delayed, and it may be different from the result that you would get from your finger.  The most common alternative sites are: ? Forearm. ? Thigh. ? Palm of the hand.  Additional tips  Always keep your supplies with you.  If you have questions or need help, all blood glucose meters have a 24-hour "hotline" number that you can call. You may also contact your health care provider.  After you use a few boxes of test strips, adjust (calibrate) your blood glucose meter by following instructions that came with your meter.    The American Diabetes  Association suggests the following targets for most nonpregnant adults with diabetes.  More or less stringent glycemic goals may be appropriate for each individual.  A1C: Less than 7% A1C may also be reported as eAG: Less than 154 mg/dl Before a meal (preprandial plasma glucose): 80-130 mg/dl 1-2 hours after beginning of the meal (Postprandial plasma glucose)*: Less than 180 mg/dl  *Postprandial glucose may be targeted if A1C goals are not met despite reaching preprandial glucose goals.   GOALS in short:  The goals are for the Hgb A1C to be less than 7.0 & blood pressure to be less than 130/80.    It is recommended that all diabetics are educated on and follow a healthy diabetic diet, exercise for 30 minutes 3-4 times per week (walking, biking, swimming, or machine), monitor blood glucose readings and bring that record with you to be reviewed at your next office visit.     You should be checking fasting blood sugars- especially after you eat poorly or eat really healthy, and also check 2 hour postprandial blood sugars after largest meal of the day.    Write these down and bring in your log at each office visit.    You will need to be seen every 3 months by the  provider managing your Diabetes unless told otherwise by that provider.   You will need yearly eye exams from an eye specialist and foot exams to check the nerves of your feet.  Also, your urine should be checked yearly as well to make sure excess protein is not present.   If you are checking your blood pressure at home, please record it and bring it to your next office visit.    Follow the Dietary Approaches to Stop Hypertension (DASH) diet (3 servings of fruit and vegetables daily, whole grains, low sodium, low-fat proteins).  See below.    Lastly, when it comes to your cholesterol, the goal is to have the HDL (good cholesterol) >40, and the LDL (bad cholesterol) <100.   It is recommended that you follow a heart healthy, low saturated  and trans-fat diet and exercise for 30 minutes at least 5 times a week.     (( Check out the DASH diet = 1.5 Gram Low Sodium Diet   A 1.5 gram sodium diet restricts the amount of sodium in the diet to no more than 1.5 g or 1500 mg daily.  The American Heart Association recommends Americans over the age of 70 to consume no more than 1500 mg of sodium each day to reduce the risk of developing high blood pressure.  Research also shows that limiting sodium may reduce heart attack and stroke risk.  Many foods contain sodium for flavor and sometimes as a preservative.  When the amount of sodium in a diet needs to be low, it is important to know what to look for when choosing foods and drinks.  The following includes some information and guidelines to help make it easier for you to adapt to a low sodium diet.    QUICK TIPS  Do not add salt to food.  Avoid convenience items and fast food.  Choose unsalted snack foods.  Buy lower sodium products, often labeled as "lower sodium" or "no salt added."  Check food labels to learn how much sodium is in 1 serving.  When eating at a restaurant, ask that your food be prepared with less salt or none, if possible.    READING FOOD LABELS FOR SODIUM INFORMATION  The nutrition facts label is a good place to find how much sodium is in foods. Look for products with no more than 400 mg of sodium per serving.  Remember that 1.5 g = 1500 mg.  The food label may also list foods as:  Sodium-free: Less than 5 mg in a serving.  Very low sodium: 35 mg or less in a serving.  Low-sodium: 140 mg or less in a serving.  Light in sodium: 50% less sodium in a serving. For example, if a food that usually has 300 mg of sodium is changed to become light in sodium, it will have 150 mg of sodium.  Reduced sodium: 25% less sodium in a serving. For example, if a food that usually has 400 mg of sodium is changed to reduced sodium, it will have 300 mg of sodium.    CHOOSING FOODS   Grains  Avoid: Salted crackers and snack items. Some cereals, including instant hot cereals. Bread stuffing and biscuit mixes. Seasoned rice or pasta mixes.  Choose: Unsalted snack items. Low-sodium cereals, oats, puffed wheat and rice, shredded wheat. English muffins and bread. Pasta.  Meats  Avoid: Salted, canned, smoked, spiced, pickled meats, including fish and poultry. Bacon, ham, sausage, cold cuts, hot dogs, anchovies.  Choose:  Low-sodium canned tuna and salmon. Fresh or frozen meat, poultry, and fish.  Dairy  Avoid: Processed cheese and spreads. Cottage cheese. Buttermilk and condensed milk. Regular cheese.  Choose: Milk. Low-sodium cottage cheese. Yogurt. Sour cream. Low-sodium cheese.  Fruits and Vegetables  Avoid: Regular canned vegetables. Regular canned tomato sauce and paste. Frozen vegetables in sauces. Olives. Angie Fava. Relishes. Sauerkraut.  Choose: Low-sodium canned vegetables. Low-sodium tomato sauce and paste. Frozen or fresh vegetables. Fresh and frozen fruit.  Condiments  Avoid: Canned and packaged gravies. Worcestershire sauce. Tartar sauce. Barbecue sauce. Soy sauce. Steak sauce. Ketchup. Onion, garlic, and table salt. Meat flavorings and tenderizers.  Choose: Fresh and dried herbs and spices. Low-sodium varieties of mustard and ketchup. Lemon juice. Tabasco sauce. Horseradish.    SAMPLE 1.5 GRAM SODIUM MEAL PLAN:   Breakfast / Sodium (mg)  1 cup low-fat milk / 143 mg  1 whole-wheat English muffin / 240 mg  1 tbs heart-healthy margarine / 153 mg  1 hard-boiled egg / 139 mg  1 small orange / 0 mg  Lunch / Sodium (mg)  1 cup raw carrots / 76 mg  2 tbs no salt added peanut butter / 5 mg  2 slices whole-wheat bread / 270 mg  1 tbs jelly / 6 mg   cup red grapes / 2 mg  Dinner / Sodium (mg)  1 cup whole-wheat pasta / 2 mg  1 cup low-sodium tomato sauce / 73 mg  3 oz lean ground beef / 57 mg  1 small side salad (1 cup raw spinach leaves,  cup cucumber,  cup  yellow bell pepper) with 1 tsp olive oil and 1 tsp red wine vinegar / 25 mg  Snack / Sodium (mg)  1 container low-fat vanilla yogurt / 107 mg  3 graham cracker squares / 127 mg  Nutrient Analysis  Calories: 1745  Protein: 75 g  Carbohydrate: 237 g  Fat: 57 g  Sodium: 1425 mg  Document Released: 05/31/2005 Document Revised: 02/10/2011 Document Reviewed: 09/01/2009  ExitCare Patient Information 2012 [place].))    This information is not intended to replace advice given to you by your health care provider. Make sure you discuss any questions you have with your health care provider. Document Released: 06/03/2003 Document Revised: 12/19/2015 Document Reviewed: 11/10/2015 Elsevier Interactive Patient Education  2017 [employer].

## 2017-07-05 DIAGNOSIS — G894 Chronic pain syndrome: Secondary | ICD-10-CM | POA: Diagnosis not present

## 2017-07-05 DIAGNOSIS — M62830 Muscle spasm of back: Secondary | ICD-10-CM | POA: Diagnosis not present

## 2017-07-05 DIAGNOSIS — M47812 Spondylosis without myelopathy or radiculopathy, cervical region: Secondary | ICD-10-CM | POA: Diagnosis not present

## 2017-07-05 DIAGNOSIS — M47817 Spondylosis without myelopathy or radiculopathy, lumbosacral region: Secondary | ICD-10-CM | POA: Diagnosis not present

## 2017-07-06 ENCOUNTER — Encounter: Payer: Self-pay | Admitting: Adult Health

## 2017-07-06 ENCOUNTER — Other Ambulatory Visit: Payer: Self-pay | Admitting: Neurology

## 2017-07-06 ENCOUNTER — Ambulatory Visit (INDEPENDENT_AMBULATORY_CARE_PROVIDER_SITE_OTHER): Payer: Medicare Other | Admitting: Adult Health

## 2017-07-06 VITALS — BP 114/71 | HR 74 | Wt 276.0 lb

## 2017-07-06 DIAGNOSIS — Z5181 Encounter for therapeutic drug level monitoring: Secondary | ICD-10-CM | POA: Diagnosis not present

## 2017-07-06 DIAGNOSIS — F449 Dissociative and conversion disorder, unspecified: Secondary | ICD-10-CM | POA: Diagnosis not present

## 2017-07-06 DIAGNOSIS — R569 Unspecified convulsions: Secondary | ICD-10-CM

## 2017-07-06 DIAGNOSIS — G894 Chronic pain syndrome: Secondary | ICD-10-CM | POA: Diagnosis not present

## 2017-07-06 NOTE — Progress Notes (Signed)
I agree with the assessment and plan as directed by NP .The patient is known to me .   Tarrance Januszewski, MD  

## 2017-07-06 NOTE — Progress Notes (Signed)
PATIENT: [name] DOB: 20-Jun-1970  REASON FOR VISIT: follow up HISTORY FROM: patient  HISTORY OF PRESENT ILLNESS:  Today 07/06/17 [name] is a 47 year old female with a history of seizure activity, conversion disorder and chronic pain.  She returns today for follow-up.  At the last visit Depakote was increased to 750 mg twice a day.  She reports that her seizure events are "not as bad" as they were.  She states earlier this week she was jerking and shaking in extremities.  However she did not lose consciousness and was fully alert and awake when this was occurring.  She did not bite her tongue.  She did not lose the bowels or bladder.  She continues to use Cymbalta for diffuse pain.  She feels that this works well for her neuropathy but does not control her diffuse pain.  She is currently trying to get set up with a pain clinic.  She denies any new neurological symptoms.  She returns today for evaluation.   HISTORY 07/13/2016: [name] is a 47 year old female with a history of seizure activity, conversion disorder and chronic pain. She returns today for evaluation. She states that the Cymbalta has helping with the burning and tingling pain that she was experiencing however she does have diffuse pain throughout her body. She reports that within the last month her seizures have increased in frequency. She reports that there was a time that it was controlled with Depakote. She describes her seizures as the inability to talk however she is fully conscious. She does develop full body jerking that she is completely aware of. She states that she can even take a tongue blade and hold her tongue down while having these episodes. She states after the episode she does feel very tired often confused. In the past she's had a sleep study that was relatively unremarkable. She had an EEG in February 2013 that was normal. She is currently taking Depakote 250 mg in the morning and 500 mg in the evening.  Patient reports that she often has a hard time falling asleep. She sometimes will not go to bed until 3 or 4 in the morning. She then wakes up to get her child to school at 7 but then may go back to sleep afterwards. She returns today for an evaluation.  REVIEW OF SYSTEMS: Out of a complete 14 system review of symptoms, the patient complains only of the following symptoms, and all other reviewed systems are negative.  ALLERGIES: Allergies  Allergen Reactions  . Ace Inhibitors Other (See Comments)    hyperkalemia  . Reglan [Metoclopramide Hcl] Anxiety  . Adhesive [Tape] Itching and Rash    Specifically tegaderm, normal adhesive tape is ok    HOME MEDICATIONS: Outpatient Medications Prior to Visit  Medication Sig Dispense Refill  . acetaminophen (TYLENOL) 500 MG tablet Take 1,000 mg by mouth 2 (two) times daily as needed for moderate pain.    Marland Kitchen albuterol (PROVENTIL HFA;VENTOLIN HFA) 108 (90 Base) MCG/ACT inhaler Inhale 1-2 puffs into the lungs every 6 (six) hours as needed for wheezing or shortness of breath.     . allopurinol (ZYLOPRIM) 300 MG tablet Take 300 mg by mouth daily.  2  . baclofen (LIORESAL) 10 MG tablet TAKE 1 TABLET BY MOUTH 2 (TWO) TIMES DAILY AS NEEDED FOR MUSCLE SPASMS. 60 tablet 0  . BLACK CURRANT SEED OIL PO Take 15 mLs by mouth daily.     . Blood Glucose Monitoring Suppl (FREESTYLE  FREEDOM LITE) w/Device KIT Check fasting and 2 hour PP 1 each 1  . carvedilol (COREG) 25 MG tablet Take 0.5 tablets (12.5 mg total) by mouth 2 (two) times daily with a meal. 60 tablet 6  . Cholecalciferol (VITAMIN D3) 5000 UNITS CAPS Take 5,000 Units by mouth daily.     . Coenzyme Q10 (CO Q-10) 100 MG CAPS Take 100 mg by mouth daily.    . Cyanocobalamin 2500 MCG SUBL Place 2,500 mcg under the tongue daily.    . digoxin (LANOXIN) 0.125 MG tablet TAKE 0.5 TABLETS (0.0625 MG TOTAL) BY MOUTH DAILY. 15 tablet 2  . divalproex (DEPAKOTE ER) 500 MG 24 hr tablet Take 1 tablet in a.m. and 2 tablets in  p.m. 90 tablet 5  . DULoxetine (CYMBALTA) 30 MG capsule Take 1 capsule (30 mg total) by mouth 2 (two) times daily. 60 capsule 5  . ferrous sulfate 325 (65 FE) MG tablet Take 325 mg by mouth daily with breakfast.    . FREESTYLE UNISTICK II LANCETS MISC Check fasting blood sugar in the morning and check sugar after largest meal of the day. 100 each 12  . GLUCOSAMINE-CHONDROITIN PO Take 1,200 mg by mouth daily.    Marland Kitchen glucose blood (FREESTYLE TEST STRIPS) test strip To be filled with Freedom Lite test strips.   Check fasting blood sugar in the morning and check sugar after largest meal of the day. 100 each 12  . hyaluronate sodium (RADIAPLEXRX) GEL Apply 1 application topically once.    . hydrOXYzine (ATARAX/VISTARIL) 25 MG tablet Take 25 mg by mouth at bedtime as needed for itching (DOESN'T TAKE WITH TRAZODONE).    Marland Kitchen ivabradine (CORLANOR) 5 MG TABS tablet Take 1.5 tablets (7.5 mg total) 2 (two) times daily with a meal by mouth. 90 tablet 3  . letrozole (FEMARA) 2.5 MG tablet Take 1 tablet (2.5 mg total) by mouth daily. 90 tablet 4  . loratadine (CLARITIN) 10 MG tablet Take 10 mg by mouth daily.    Marland Kitchen losartan (COZAAR) 25 MG tablet Take 1 tablet (25 mg total) by mouth daily. 90 tablet 3  . Menthol, Topical Analgesic, (ICY HOT EX) Apply 1 application topically daily as needed (muscle pain).    Marland Kitchen metolazone (ZAROXOLYN) 2.5 MG tablet TAKE 1 TABLET (2.5 MG TOTAL) BY MOUTH DAILY AS NEEDED (EDEMA). 15 tablet 0  . Milk Thistle 150 MG CAPS Take 450 mg by mouth daily.     . Multiple Vitamin (MULTIVITAMIN WITH MINERALS) TABS Take 1 tablet by mouth daily. Reported on 0/02/9832    . non-metallic deodorant Jethro Poling) MISC Apply 1 application topically daily as needed.    Marland Kitchen omeprazole (PRILOSEC) 40 MG capsule TAKE 1 CAPSULE (40 MG TOTAL) BY MOUTH 2 (TWO) TIMES DAILY BEFORE A MEAL. BREAKFAST AND SUPPER 60 capsule 9  . ondansetron (ZOFRAN) 4 MG tablet Take 1 tablet (4 mg total) by mouth every 8 (eight) hours as needed for  nausea or vomiting. 20 tablet 3  . PATADAY 0.2 % SOLN Place 1 drop into both eyes daily as needed (allergies).     . pentosan polysulfate (ELMIRON) 100 MG capsule Take 100 mg by mouth 2 (two) times daily as needed (bladder spasms).     . potassium chloride Roy (K-DUR,KLOR-CON) 20 MEQ tablet Take 2 to 3 tablets daily as directed. 180 tablet 3  . RESTASIS 0.05 % ophthalmic emulsion     . Sodium Chloride-Sodium Bicarb (NETI POT SINUS WASH NA) Place 1 Dose into the nose  daily as needed (congestion).    Marland Kitchen spironolactone (ALDACTONE) 25 MG tablet Take 1 tablet (25 mg total) daily by mouth. 90 tablet 3  . torsemide (DEMADEX) 20 MG tablet TAKE 2 TABLETS (40 MG TOTAL) BY MOUTH DAILY. 60 tablet 6  . traZODone (DESYREL) 50 MG tablet Take 50 mg by mouth at bedtime as needed for sleep.     Marland Kitchen triamcinolone cream (KENALOG) 0.1 % Apply 1 application topically 2 (two) times daily as needed (eczema).    . venlafaxine XR (EFFEXOR-XR) 37.5 MG 24 hr capsule Take 1 capsule (37.5 mg total) by mouth daily with breakfast. 30 capsule 6  . Vitamin D, Ergocalciferol, (DRISDOL) 50000 units CAPS capsule Take 1 capsule (50,000 Units total) by mouth every 7 (seven) days. 12 capsule 3   No facility-administered medications prior to visit.     PAST MEDICAL HISTORY: Past Medical History:  Diagnosis Date  . Acne   . AICD (automatic cardioverter/defibrillator) present   . Anemia 1980's   [id number]  . Anginal pain ([place])    sporatic , been going on for yrs,  . Anxiety   . Arthritis   . 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 Blood transfusion 1980's   1987 or 1988  . Breast calcifications    right breast  . Breast cancer (HCC)    right;  . 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 Cardiac MRI 05/2011 EF 32%. ICD implantation 10/2011 ([employer])  . 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 Conversion disorder   . 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 Depression   . Endometriosis   . Family history of colon cancer   . Family history of ovarian cancer   . Fibromyalgia   . GERD (gastroesophageal reflux disease)   . Headache   . Hepatomegaly   . History of stomach ulcers   . Hyperlipidemia   . Hypertension    c/b orthostatic hypotention  . ICD (implantable cardiac defibrillator) in place   . Insomnia   . Interstitial cystitis   . Left eye injury 12/2014  . 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   . Nonischemic cardiomyopathy ([place])    related to chemo; EF 30% 05/2011  . Obesity   . Palpitation    normal sinus rhythm only on 21 day heart monitor  . Panic attacks   . Peripheral neuropathy    chemo- induced  . Pneumonia 2000's   "once"  . Seasonal allergies   . Seizures ([place])    last seizure  last week: non elipsey seisure  . Type II diabetes mellitus ([place])     PAST SURGICAL HISTORY: Past Surgical History:  Procedure Laterality Date  . ABDOMINAL HYSTERECTOMY     partial  . BREAST EXCISIONAL BIOPSY     right benign 2012  . BREAST LUMPECTOMY  2008; 2013   right radiation  . BREAST LUMPECTOMY WITH RADIOACTIVE SEED AND SENTINEL LYMPH NODE BIOPSY Left 02/21/2017   Procedure: LEFT BREAST LUMPECTOMY WITH RADIOACTIVE SEED AND LEFT AXILLARY DEEP SENTINEL LYMPH NODE BIOPSY;  Surgeon: Fanny Skates, MD;  Location: [facility];  Service: General;  Laterality: Left;  . CARDIAC DEFIBRILLATOR PLACEMENT  11/03/11  . CESAREAN SECTION    . CHOLECYSTECTOMY  ~ 2009  . COLONOSCOPY    . ICD REVISION N/A 02/21/2017   Procedure: ICD Revision;  Surgeon: Deboraha Sprang,  MD;  Location: [facility];  Service: Cardiovascular;  Laterality: N/A;  . IMPLANTABLE CARDIOVERTER DEFIBRILLATOR IMPLANT N/A 11/03/2011   Procedure: IMPLANTABLE CARDIOVERTER DEFIBRILLATOR IMPLANT;  Surgeon: Deboraha Sprang, MD; MDT   . LAPAROSCOPIC ENDOMETRIOSIS FULGURATION   1990's  . NASAL SINUS SURGERY    . [place] REMOVAL  2010?   left chest; placed in 2008  . TONSILLECTOMY  1980's  . TUBAL LIGATION  1990's    FAMILY HISTORY: Family History  Problem Relation Age of Onset  . Heart disease Mother   . Arthritis Mother   . Hypertension Mother   . Stroke Mother   . Fibromyalgia Mother   . Coronary artery disease Unknown   . Heart attack Unknown   . Heart disease Maternal Uncle   . Colon cancer Paternal Uncle        dx over 68  . Heart disease Maternal Grandmother   . Heart attack Maternal Grandmother   . Ovarian cancer Paternal Grandmother   . Fibromyalgia Sister   . Ovarian cancer Paternal Aunt     SOCIAL HISTORY: Social History   Socioeconomic History  . Marital status: Married    Spouse name: [name]  . Number of children: 1  . Years of education: 65  . Highest education level: Not on file  Social Needs  . Financial resource strain: Not on file  . Food insecurity - worry: Not on file  . Food insecurity - inability: Not on file  . Transportation needs - medical: Not on file  . Transportation needs - non-medical: Not on file  Occupational History  . Occupation: Disablity  Tobacco Use  . Smoking status: Never Smoker  . Smokeless tobacco: Never Used  Substance and Sexual Activity  . Alcohol use: Yes    Comment: social drinker   . Drug use: No  . Sexual activity: Not on file  Other Topics Concern  . Not on file  Social History Narrative   Patient is married [name]) and lives at home with her family.   Patient has one child. A daughter   Patient is right-handed.   Patient has a college education.   Patient drinks some caffeine occasionally, but not everyday.   Regular exercise   She is disabled   02/23/2016   Updated      PHYSICAL EXAM  There were no vitals filed for this visit. There is no height or weight on file to calculate BMI.  Generalized: Well developed, in no acute distress   Neurological examination    Mentation: Alert oriented to time, place, history taking. Follows all commands speech and language fluent Cranial nerve II-XII: Pupils were equal round reactive to light. Extraocular movements were full, visual field were full on confrontational test. Facial sensation and strength were normal. Uvula tongue midline. Head turning and shoulder shrug  were normal and symmetric. Motor: The motor testing reveals 5 over 5 strength of all 4 extremities. Good symmetric motor tone is noted throughout.  Sensory: Sensory testing is intact to soft touch on all 4 extremities. No evidence of extinction is noted.  Coordination: Cerebellar testing reveals good finger-nose-finger and heel-to-shin bilaterally.  Gait and station: Gait is slow and cautious.  Tandem gait not attempted. Reflexes: Deep tendon reflexes are symmetric and normal bilaterally.   DIAGNOSTIC DATA (LABS, IMAGING, TESTING) - I reviewed patient records, labs, notes, testing and imaging myself where available.  Lab Results  Component Value Date   WBC 4.9 05/30/2017   HGB 11.4 (L)  05/30/2017   HCT 35.0 05/30/2017   MCV 104.2 (H) 05/30/2017   PLT 122 (L) 05/30/2017      Component Value Date/Time   NA 141 05/30/2017 1314   K 3.1 (L) 05/30/2017 1314   CL 99 (L) 05/09/2017 1540   CO2 28 05/30/2017 1314   GLUCOSE 113 05/30/2017 1314   BUN 13.4 05/30/2017 1314   CREATININE 1.0 05/30/2017 1314   CALCIUM 8.8 05/30/2017 1314   PROT 7.1 05/30/2017 1314   ALBUMIN 3.4 (L) 05/30/2017 1314   AST 15 05/30/2017 1314   ALT 15 05/30/2017 1314   ALKPHOS 102 05/30/2017 1314   BILITOT 0.62 05/30/2017 1314   GFRNONAA >60 05/09/2017 1540   GFRAA >60 05/09/2017 1540   Lab Results  Component Value Date   CHOL 191 03/02/2017   HDL 69 03/02/2017   LDLCALC 100 (H) 03/02/2017   TRIG 108 03/02/2017   CHOLHDL 2.8 03/02/2017   Lab Results  Component Value Date   HGBA1C 5.6 06/30/2017   Lab Results  Component Value Date   VITAMINB12 1,227  03/02/2017   Lab Results  Component Value Date   TSH 5.640 (H) 03/02/2017      ASSESSMENT AND PLAN 47 y.o. year old female  has a past medical history of Acne, AICD (automatic cardioverter/defibrillator) present, Anemia (1980's), Anginal pain ([place]), Anxiety, Arthritis, Ataxia (04/27/2013), Blood transfusion (1980's), Breast calcifications, Breast cancer (HCC), CHF (congestive heart failure) ([place]), Cognitive and neurobehavioral dysfunction (04/27/2013), Conversion disorder, COPD with asthma ([place]) (11/15/2012), Depression, Endometriosis, Family history of colon cancer, Family history of ovarian cancer, Fibromyalgia, GERD (gastroesophageal reflux disease), Headache, Hepatomegaly, History of stomach ulcers, Hyperlipidemia, Hypertension, ICD (implantable cardiac defibrillator) in place, Insomnia, Interstitial cystitis, Left eye injury (12/2014), 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 here with :  1.  Seizures 2.  Conversion disorder 3.  Chronic pain  Overall the patient has remained stable.  She will continue on Depakote 750 mg twice a day.  I will check blood work today.  Her seizure presentation is not typical.  She has convulsing in all extremities but does not lose consciousness and is fully awake and alert when this occurs.  Past EEGs have been normal.  She will continue on Cymbalta 30 mg twice a day.  This with her psychiatrist  about insomnia.  She is advised that if her symptoms worsen or she develops new symptoms she should let us know.  She will follow-up in 6 months or sooner if needed.     Ward Givens, MSN, NP-C 07/06/2017, 10:34 AM [facility] [address], [place], [place] [address] [phone]

## 2017-07-06 NOTE — Patient Instructions (Addendum)
Your Plan:  Continue Depakote and Cymbalta Speak with psychiatrist about sleep Blood work today If your symptoms worsen or you develop new symptoms please let us know.   Thank you for coming to see [place] at [facility]. I hope we have been able to provide you high quality care today.  You may receive a patient satisfaction survey over the next few weeks. We would appreciate your feedback and comments so that we may continue to improve ourselves and the health of our patients.

## 2017-07-07 ENCOUNTER — Telehealth: Payer: Self-pay | Admitting: *Deleted

## 2017-07-07 LAB — CBC WITH DIFFERENTIAL/PLATELET
BASOS ABS: 0 10*3/uL (ref 0.0–0.2)
Basos: 1 %
EOS (ABSOLUTE): 0 10*3/uL (ref 0.0–0.4)
Eos: 1 %
HEMOGLOBIN: 13 g/dL (ref 11.1–15.9)
Hematocrit: 39.5 % (ref 34.0–46.6)
IMMATURE GRANULOCYTES: 0 %
Immature Grans (Abs): 0 10*3/uL (ref 0.0–0.1)
LYMPHS: 22 %
Lymphocytes Absolute: 1 10*3/uL (ref 0.7–3.1)
MCH: 33.2 pg — ABNORMAL HIGH (ref 26.6–33.0)
MCHC: 32.9 g/dL (ref 31.5–35.7)
MCV: 101 fL — ABNORMAL HIGH (ref 79–97)
MONOCYTES: 17 %
Monocytes Absolute: 0.8 10*3/uL (ref 0.1–0.9)
NEUTROS PCT: 59 %
Neutrophils Absolute: 2.6 10*3/uL (ref 1.4–7.0)
Platelets: 155 10*3/uL (ref 150–379)
RBC: 3.92 x10E6/uL (ref 3.77–5.28)
RDW: 14.5 % (ref 12.3–15.4)
WBC: 4.4 10*3/uL (ref 3.4–10.8)

## 2017-07-07 LAB — COMPREHENSIVE METABOLIC PANEL
ALK PHOS: 108 IU/L (ref 39–117)
ALT: 14 IU/L (ref 0–32)
AST: 20 IU/L (ref 0–40)
Albumin/Globulin Ratio: 1.3 (ref 1.2–2.2)
Albumin: 4.1 g/dL (ref 3.5–5.5)
BILIRUBIN TOTAL: 1 mg/dL (ref 0.0–1.2)
BUN / CREAT RATIO: 14 (ref 9–23)
BUN: 13 mg/dL (ref 6–24)
CHLORIDE: 96 mmol/L (ref 96–106)
CO2: 26 mmol/L (ref 20–29)
CREATININE: 0.93 mg/dL (ref 0.57–1.00)
Calcium: 10 mg/dL (ref 8.7–10.2)
GFR calc Af Amer: 85 mL/min/{1.73_m2} (ref >59)
GFR calc non Af Amer: 74 mL/min/{1.73_m2} (ref >59)
GLUCOSE: 116 mg/dL — AB (ref 65–99)
Globulin, Total: 3.1 g/dL (ref 1.5–4.5)
Potassium: 3.7 mmol/L (ref 3.5–5.2)
Sodium: 139 mmol/L (ref 134–144)
Total Protein: 7.2 g/dL (ref 6.0–8.5)

## 2017-07-07 LAB — VALPROIC ACID LEVEL: VALPROIC ACID LVL: 89 ug/mL (ref 50–100)

## 2017-07-07 NOTE — Telephone Encounter (Signed)
Spoke with patient and informed her that her lab work is relatively unremarkable and stable. She verbalized understanding, appreciation.

## 2017-07-11 ENCOUNTER — Other Ambulatory Visit: Payer: Self-pay | Admitting: Oncology

## 2017-07-13 ENCOUNTER — Ambulatory Visit: Admission: RE | Admit: 2017-07-13 | Payer: Medicare Other | Source: Ambulatory Visit | Admitting: Radiation Oncology

## 2017-07-18 ENCOUNTER — Ambulatory Visit: Payer: Medicare Other | Admitting: Psychology

## 2017-07-20 ENCOUNTER — Other Ambulatory Visit (HOSPITAL_COMMUNITY): Payer: Self-pay | Admitting: Pharmacist

## 2017-07-20 MED ORDER — IVABRADINE HCL 7.5 MG PO TABS: 7.5000 mg | ORAL_TABLET | Freq: Two times a day (BID) | ORAL | 5 refills | Status: DC

## 2017-07-20 NOTE — Telephone Encounter (Signed)
Ms. Lippold is asking if she still has PANF grant to use for her Corlanor since she was told it would not cover her Corlanor anymore. I have called the pharmacy who stated that she is being charged >$200/mo for Corlanor 5 mg tabs (1.5 tabs BID). Will try to change to Corlanor 7.5 mg tabs BID to see if this helps with the cost since she only has $155 left in PANF grant through April.   [name]. [name], PharmD, BCPS, CPP Clinical Pharmacist Phone: [phone] 07/20/2017 5:12 PM

## 2017-07-22 NOTE — Telephone Encounter (Signed)
Called [facility] who stated that Ms. Kachel's Corlanor was still $218/mo (likely has a deductible to meet). Since she only had $152 left in PANF, it brought her cost down to $66.27 for the month and because this was a 2nd [place] within the year she is not eligible for another grant until 10/11/17. I have called her and left a VM with this info and to call me back with any questions.   [name]. [name], PharmD, BCPS, CPP Clinical Pharmacist Phone: [phone] 07/22/2017 11:45 AM

## 2017-07-23 ENCOUNTER — Other Ambulatory Visit: Payer: Self-pay | Admitting: Neurology

## 2017-07-23 DIAGNOSIS — G620 Drug-induced polyneuropathy: Secondary | ICD-10-CM

## 2017-07-23 DIAGNOSIS — T451X5A Adverse effect of antineoplastic and immunosuppressive drugs, initial encounter: Secondary | ICD-10-CM

## 2017-07-23 DIAGNOSIS — M1049 Other secondary gout, multiple sites: Secondary | ICD-10-CM

## 2017-07-23 DIAGNOSIS — F444 Conversion disorder with motor symptom or deficit: Secondary | ICD-10-CM

## 2017-07-25 ENCOUNTER — Telehealth: Payer: Self-pay | Admitting: Adult Health

## 2017-07-25 NOTE — Telephone Encounter (Signed)
Pt states she was told to call if she had any other problems.  Pt calling to inform that since the appointment she is experiencing leg buckling, falling, hand shaking and tremors all over.  Pt is asking for a call back to discuss

## 2017-07-25 NOTE — Telephone Encounter (Addendum)
I spoke to pt she has noted since last seen legs buckling, has falled x 2 (hurt ankle, knee skinned),  If feels like going to fall will sit.  Has trembling in whole body., cant dial or text, has upper body twitch, legs feel funny, numb, has bilateral grasp weakness, has some stool incontinence, this has been going on for 2 months.   She is taking depakote 750mg  po BID.  Conversion d/o? Or something else going on?  She states she see;s [place] for this.  Please advise.

## 2017-07-25 NOTE — Telephone Encounter (Addendum)
I relayed to pt that MM/NP did not feel comfortable relating sx to conversion disorder and would defer this to Dr. Brett Fairy who is out of the office today.  Can send to [facility] or wait for Dr. Brett Fairy tomorrow if she is here.  She states her sx started Friday,  She was ok to wait until tomorrow to see what Dr. Brett Fairy says.

## 2017-07-26 NOTE — Progress Notes (Signed)
Simulation verification   ICD-10-CM   1. Malignant neoplasm of upper-inner quadrant of left breast in female, estrogen receptor positive ([place]) C50.212    Z17.0     The patient was brought to the treatment machine and placed in the plan treatment position.  Clinical set up was verified to ensure that the target region is appropriately covered for the patient's upcoming electron boost treatment.  The targeted volume of tissue is appropriately covered by the radiation field.  Based on my personal review, I approve the simulation verification.  The patient's treatment will proceed as planned.  ----------------------------------------------  Eppie Gibson, MD

## 2017-07-26 NOTE — Progress Notes (Signed)
Simulation note   ICD-10-CM   1. Malignant neoplasm of upper-inner quadrant of left breast in female, estrogen receptor positive ([place]) C50.212    Z17.0     The patient was brought to the treatment room for simulation for the patient's upcoming electron treatment. The patient was setup in the treatment position and the target region was delineated. The patient will receive treatment to the left breast/ chest wall using an en face electron field. One customized block/complex treatment device has been constructed for this purpose, and this will be used on a daily basis during the patient's treatment. After appropriate set up was confirmed, skin markings were placed to allow accurate targeting of the treatment area during the patient's course of therapy.  ---------------------------------------  Eppie Gibson, MD

## 2017-07-27 NOTE — Telephone Encounter (Signed)
Called the patient back to discuss more in detail. Dr Dohmeier does not feel this could be related to conversion disorder or its hard to say if that is the case given her history. She states that due to her breast cancer treatment in the past that can a lot of times cause difficulty with weakness. To be on the safe side Dr Brett Fairy would like to order a MRI of the cervical spine for the patient. If there is medical reasoning the patient cant have MRI then a CT with and without contrast of cervical neck would be the next treatment plan.  She would also like to offer PT/OT evaluations for the patient to help with weakness.   No answer, LVM for the patient to call back so that we can discuss further I will not place orders until discussing with the patient first.

## 2017-07-27 NOTE — Telephone Encounter (Signed)
Pt called back today, she expecting a call back please.

## 2017-07-28 ENCOUNTER — Other Ambulatory Visit: Payer: Self-pay | Admitting: Neurology

## 2017-07-28 DIAGNOSIS — R29898 Other symptoms and signs involving the musculoskeletal system: Secondary | ICD-10-CM

## 2017-07-28 DIAGNOSIS — R52 Pain, unspecified: Secondary | ICD-10-CM

## 2017-07-28 NOTE — Telephone Encounter (Signed)
Patient returned call and I went over everything that Dr Dohmeier recommended. I informed her of what she would like to order. The patient is wandering if she should have one of the back too. Patient complains stating that the back is in pain when she has her shaking and pain. She wants to hold off on placing the PT/OT order until she has results from the test. I informed her that I would place the order and that someone will be in contact with her to get her set up.

## 2017-07-28 NOTE — Telephone Encounter (Signed)
Orders are placed. Will wait to get results from these test before proceeding forward with the PT/OT eval

## 2017-07-28 NOTE — Telephone Encounter (Signed)
Cervical and thoracic CT OK.

## 2017-07-29 ENCOUNTER — Encounter: Payer: Self-pay | Admitting: Radiation Oncology

## 2017-07-29 ENCOUNTER — Ambulatory Visit (HOSPITAL_COMMUNITY)
Admission: RE | Admit: 2017-07-29 | Discharge: 2017-07-29 | Disposition: A | Discharge status: Home / Self Care | Payer: Medicare Other | Source: Ambulatory Visit | Attending: Internal Medicine | Admitting: Internal Medicine

## 2017-07-29 ENCOUNTER — Encounter (HOSPITAL_COMMUNITY): Payer: Self-pay | Admitting: Internal Medicine

## 2017-07-29 VITALS — BP 97/68 | HR 94 | Wt 283.8 lb

## 2017-07-29 DIAGNOSIS — E785 Hyperlipidemia, unspecified: Secondary | ICD-10-CM | POA: Insufficient documentation

## 2017-07-29 DIAGNOSIS — Z9889 Other specified postprocedural states: Secondary | ICD-10-CM | POA: Insufficient documentation

## 2017-07-29 DIAGNOSIS — F419 Anxiety disorder, unspecified: Secondary | ICD-10-CM | POA: Insufficient documentation

## 2017-07-29 DIAGNOSIS — Z800 Family history of malignant neoplasm of digestive organs: Secondary | ICD-10-CM | POA: Diagnosis not present

## 2017-07-29 DIAGNOSIS — J449 Chronic obstructive pulmonary disease, unspecified: Secondary | ICD-10-CM | POA: Diagnosis not present

## 2017-07-29 DIAGNOSIS — Z79899 Other long term (current) drug therapy: Secondary | ICD-10-CM | POA: Insufficient documentation

## 2017-07-29 DIAGNOSIS — Z8719 Personal history of other diseases of the digestive system: Secondary | ICD-10-CM | POA: Insufficient documentation

## 2017-07-29 DIAGNOSIS — F329 Major depressive disorder, single episode, unspecified: Secondary | ICD-10-CM | POA: Diagnosis not present

## 2017-07-29 DIAGNOSIS — Z888 Allergy status to other drugs, medicaments and biological substances status: Secondary | ICD-10-CM | POA: Diagnosis not present

## 2017-07-29 DIAGNOSIS — M109 Gout, unspecified: Secondary | ICD-10-CM | POA: Insufficient documentation

## 2017-07-29 DIAGNOSIS — Z6841 Body Mass Index (BMI) 40.0 and over, adult: Secondary | ICD-10-CM | POA: Diagnosis not present

## 2017-07-29 DIAGNOSIS — M797 Fibromyalgia: Secondary | ICD-10-CM | POA: Insufficient documentation

## 2017-07-29 DIAGNOSIS — Z95810 Presence of automatic (implantable) cardiac defibrillator: Secondary | ICD-10-CM | POA: Insufficient documentation

## 2017-07-29 DIAGNOSIS — Z7902 Long term (current) use of antithrombotics/antiplatelets: Secondary | ICD-10-CM | POA: Insufficient documentation

## 2017-07-29 DIAGNOSIS — I427 Cardiomyopathy due to drug and external agent: Secondary | ICD-10-CM | POA: Diagnosis not present

## 2017-07-29 DIAGNOSIS — G4700 Insomnia, unspecified: Secondary | ICD-10-CM | POA: Diagnosis not present

## 2017-07-29 DIAGNOSIS — I110 Hypertensive heart disease with heart failure: Secondary | ICD-10-CM | POA: Diagnosis not present

## 2017-07-29 DIAGNOSIS — Z9109 Other allergy status, other than to drugs and biological substances: Secondary | ICD-10-CM | POA: Diagnosis not present

## 2017-07-29 DIAGNOSIS — E1140 Type 2 diabetes mellitus with diabetic neuropathy, unspecified: Secondary | ICD-10-CM | POA: Diagnosis not present

## 2017-07-29 DIAGNOSIS — K219 Gastro-esophageal reflux disease without esophagitis: Secondary | ICD-10-CM | POA: Insufficient documentation

## 2017-07-29 DIAGNOSIS — C50919 Malignant neoplasm of unspecified site of unspecified female breast: Secondary | ICD-10-CM | POA: Insufficient documentation

## 2017-07-29 DIAGNOSIS — R296 Repeated falls: Secondary | ICD-10-CM | POA: Diagnosis not present

## 2017-07-29 DIAGNOSIS — I5022 Chronic systolic (congestive) heart failure: Secondary | ICD-10-CM | POA: Diagnosis not present

## 2017-07-29 LAB — BASIC METABOLIC PANEL
Anion gap: 14 (ref 5–15)
BUN: 20 mg/dL (ref 6–20)
CHLORIDE: 99 mmol/L — AB (ref 101–111)
CO2: 22 mmol/L (ref 22–32)
Calcium: 9.8 mg/dL (ref 8.9–10.3)
Creatinine, Ser: 1.05 mg/dL — ABNORMAL HIGH (ref 0.44–1.00)
GLUCOSE: 101 mg/dL — AB (ref 65–99)
POTASSIUM: 4 mmol/L (ref 3.5–5.1)
SODIUM: 135 mmol/L (ref 135–145)

## 2017-07-29 LAB — BRAIN NATRIURETIC PEPTIDE: B Natriuretic Peptide: 724.5 pg/mL — ABNORMAL HIGH (ref 0.0–100.0)

## 2017-07-29 MED ORDER — METOLAZONE 2.5 MG PO TABS: 2.5000 mg | ORAL_TABLET | Freq: Every day | ORAL | 1 refills | Status: AC | PRN

## 2017-07-29 NOTE — Patient Instructions (Signed)
Labs today (will call for abnormal results, otherwise no news is good news)  INCREASE Torsemide to 40 mg (2 Tablets) Twice Daily for 3 days only.  Take Metolazone as needed for wt gain of 3 lbs overnight or 5 lbs in a week.  Dr. Haroldine Laws advises you to take one today.   Referral for outpatient physical and occupational therapy evaluation has been ordered for you, they will contact you to schedule initial appointment.  Follow up in 3 Months

## 2017-07-29 NOTE — Progress Notes (Signed)
Patient ID: [name], female   DOB: 1971/05/29, 47 y.o.   MRN: [id number]  ADVANCED HF CLINIC NOTE  Patient ID: [name], female   DOB: 08/06/1970, 47 y.o.   MRN: [id number]  Primary Cardiologist:  Dr. Glori Bickers Neurlogist: Dohmeir   PCP: Dr. Margaretha Sheffield Griffin/Noel Redmon, PAC   History of Present Illness: [name] is a 47 y.o. female with a history of morbid obesity, depression, fibromyalgia, congestive heart failure secondary to nonischemic cardiomyopathy (EF 20-25%) related to her chemotherapy for breast cancer. Last chemo 2008. Evaluated by neuro for stuttering.  Felt to have a conversion disorder. Confirmed by [facility].   She presented to [facility] on 11/26/14 with near syncope and dyspnea.With + orthostatics in ER (SBP 104 to 87 from lying to sitting) She also had volume overload with acute on chronic CHF. BNP 1093.0. Troponins normal. Had 7 lb  gain over 2 days with dietary indiscretion of sodium.Admitted for IV diuretic therapy and monitoring of BP. Coreg reduced 12.5 mg BID. Symptoms improved and good diuresis with IV lasix therapy. I/Os net negative 6 L total. Was transitioned back to PO Torsemide once euvolemic. Discharge weight was 249 lb.  Evaluated by pulmonary and Dr Elsworth Soho told he saw no evidence of asthma.    Recently diagnosed with recurrent Breast CA. Underwent Lumpectomy + with clean margins. Completed XRT at end of 12/18  Here for routine f/u. At last visit, we increased corlanor to 7.5 bid and spiro to 25 daily. Says she feels poorly. Very weak. Has fallen a few times. Occasionally orthostatic.afeels like her legs buckle. She thinks it is related to conversion d/o. Saw Dr. Carolynn Comment before falls and was referred to PT. They are also planning some scanning  Has not seen them yet. Very SOB. No edema, orthopnea or PND. Complaint with meds.   ICD interrogation done personally in clinic: No VT/VF. Optivol up since completing XRT. (suspect related to inflammation)  Activity level < 1 hour   Studies:  CPX 4/18   FVC 1.82 (54%)    FEV1 1.15 (42%)     FEV1/FVC 63 (77%)     MVV 58 (54%)  Resting HR: 98 Peak HR: 126  (72% age predicted max HR) BP rest: 86/64 BP peak: 76/60 Peak VO2: 6.4 (38% predicted peak VO2) VE/VCO2 slope: 50 OUES: 0.84 Peak RER: 1.11 Ventilatory Threshold: 5.4 (32% predicted or measured peak VO2) VE/MVV: 71% O2pulse: 6  (55% predicted O2pulse)   CPX Feb 2010. Peak VO2 was 14.5 which was 71% of predicted. When corrected for body weight the VO2 was 20.7. The slope was 27. RER 1.20. O2 pulse 73%. Overall this was felt to be only a very mild functional limitation due to her obesity and mild circulatory limitation.  CPX 2/11: pVO2 13.0 (72%) correct for ideal wt 67m/kg/min RER 1.06 (submax) slope 28.2 O2 pulse 91% - felt no signifcant cardiac limitation. + deconditioning.   Echo 04/2009  EF back down to 25%.  RHC normal. Echo 07/2009 showed EF 50%  Echo 06/2010 EF 40%. So we did MUGA EF 58% Echo 12/2010 40-45% Echo 05/2011 EF 30%. Grade 2 diastolic dysfunction Cath (12/28/10) was set up and demonstrated EF 20-25% and normal coronary arteries.  RA  4, RV 35/11, PA  28/8 (17), PCWP 8. Fick 5.2/2.4 PVR 1.7 Woods. Fick 5.2/2.4 Aortic saturation was 97%.  PA saturation was 67% and 68%. ECHO 09/03/13 EF 20-25%  ECHO 06/24/14 EF 20-25% Cardiac MRI on 07/19/11: Moderate to severe LVE, Diffuse hypokinesis.  EF 32%, Mild LAE ECHO 10/13/15 EF 20-25% Mild to moderate RV dysfunction   Labs  09/03/13 k 3.3 Creatinine 0.86 Pro BNP 998 Dig level 0.7 05/17/14 K 4.7 Creatinine 0.9 pBNP 1007 digoxin 1.2 11/29/14 K 4.2 Creatinine 1.08 Dig 0.3 01/19/16  K 3.7 creatinine 0.90  SH: lived with her husband. Does not drive. Does not drink alcohol or smoke  Review of systems complete and found to be negative unless listed in HPI.    Past Medical History:  Diagnosis Date  . Acne   . AICD (automatic cardioverter/defibrillator) present   . Anemia  1980's   [id number]  . Anginal pain ([place])    sporatic , been going on for yrs,  . Anxiety   . Arthritis   . 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 Blood transfusion 1980's   1987 or 1988  . Breast calcifications    right breast  . Breast cancer (HCC)    right;  . CHF (congestive heart failure) ([place])    due to non-ischemic cardiomyopathy, thought to be chemotherapy induced;  cath 7/12: normal cors, EF 20-25%. Cardiac MRI 05/2011 EF 32%. ICD implantation 10/2011 (Medtronic)  . 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 Conversion disorder   . 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 Depression   . Endometriosis   . Family history of colon cancer   . Family history of ovarian cancer   . Fibromyalgia   . GERD (gastroesophageal reflux disease)   . Headache   . Hepatomegaly   . History of stomach ulcers   . Hyperlipidemia   . Hypertension    c/b orthostatic hypotention  . ICD (implantable cardiac defibrillator) in place   . Insomnia   . Interstitial cystitis   . Left eye injury 12/2014  . 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   . Nonischemic cardiomyopathy ([place])    related to chemo; EF 30% 05/2011  . Obesity   . Palpitation    normal sinus rhythm only on 21 day heart monitor  . Panic attacks   . Peripheral neuropathy    chemo- induced  . Pneumonia 2000's   "once"  . Seasonal allergies   . Seizures ([place])    last seizure  last week: non elipsey seisure  . Type II diabetes mellitus ([place])    Current Outpatient Medications on File Prior to Encounter  Medication Sig Dispense Refill  . acetaminophen (TYLENOL) 500 MG tablet Take 1,000 mg by mouth 2 (two) times daily as needed for moderate pain.    Marland Kitchen albuterol (PROVENTIL HFA;VENTOLIN HFA) 108 (90 Base) MCG/ACT inhaler Inhale 1-2 puffs into the lungs every 6 (six) hours as needed for wheezing or shortness of breath.     . allopurinol (ZYLOPRIM) 300 MG tablet Take 300 mg  by mouth daily.  2  . baclofen (LIORESAL) 10 MG tablet TAKE 1 TABLET BY MOUTH 2 (TWO) TIMES DAILY AS NEEDED FOR MUSCLE SPASMS. 60 tablet 0  . BLACK CURRANT SEED OIL PO Take 15 mLs by mouth daily.     . Blood Glucose Monitoring Suppl (FREESTYLE FREEDOM LITE) w/Device KIT Check fasting and 2 hour PP 1 each 1  . carvedilol (COREG) 25 MG tablet Take 0.5 tablets (12.5 mg total) by mouth 2 (two) times daily with a meal. 60 tablet 6  . Cholecalciferol (VITAMIN D3) 5000 UNITS CAPS Take 5,000 Units by mouth daily.     . Coenzyme Q10 (CO Q-10) 100 MG CAPS Take  100 mg by mouth daily.    . Cyanocobalamin 2500 MCG SUBL Place 2,500 mcg under the tongue daily.    . digoxin (LANOXIN) 0.125 MG tablet TAKE 0.5 TABLETS (0.0625 MG TOTAL) BY MOUTH DAILY. 15 tablet 2  . divalproex (DEPAKOTE ER) 500 MG 24 hr tablet Take 1.5 tablets (770m) twice a day 135 tablet 1  . DULoxetine (CYMBALTA) 30 MG capsule TAKE 1 CAPSULE BY MOUTH TWICE A DAY 60 capsule 11  . ferrous sulfate 325 (65 FE) MG tablet Take 325 mg by mouth daily with breakfast.    . FREESTYLE UNISTICK II LANCETS MISC Check fasting blood sugar in the morning and check sugar after largest meal of the day. 100 each 12  . gabapentin (NEURONTIN) 100 MG capsule 1 capsule 3 (three) times daily as needed.  2  . GLUCOSAMINE-CHONDROITIN PO Take 1,200 mg by mouth daily.    .Marland Kitchenglucose blood (FREESTYLE TEST STRIPS) test strip To be filled with Freedom Lite test strips.   Check fasting blood sugar in the morning and check sugar after largest meal of the day. 100 each 12  . hyaluronate sodium (RADIAPLEXRX) GEL Apply 1 application topically once.    . hydrOXYzine (ATARAX/VISTARIL) 25 MG tablet Take 25 mg by mouth at bedtime as needed for itching (DOESN'T TAKE WITH TRAZODONE).    .Marland Kitchenletrozole (FEMARA) 2.5 MG tablet Take 1 tablet (2.5 mg total) by mouth daily. 90 tablet 4  . loratadine (CLARITIN) 10 MG tablet Take 10 mg by mouth daily.    .Marland Kitchenlosartan (COZAAR) 25 MG tablet Take 1  tablet (25 mg total) by mouth daily. 90 tablet 3  . Menthol, Topical Analgesic, (ICY HOT EX) Apply 1 application topically daily as needed (muscle pain).    .Marland Kitchenmetolazone (ZAROXOLYN) 2.5 MG tablet TAKE 1 TABLET (2.5 MG TOTAL) BY MOUTH DAILY AS NEEDED (EDEMA). 15 tablet 0  . Milk Thistle 150 MG CAPS Take 450 mg by mouth daily.     . Multiple Vitamin (MULTIVITAMIN WITH MINERALS) TABS Take 1 tablet by mouth daily. Reported on 54/0/9735   . non-metallic deodorant (Jethro Poling MISC Apply 1 application topically daily as needed.    .Marland Kitchenomeprazole (PRILOSEC) 40 MG capsule TAKE 1 CAPSULE (40 MG TOTAL) BY MOUTH 2 (TWO) TIMES DAILY BEFORE A MEAL. BREAKFAST AND SUPPER 60 capsule 9  . ondansetron (ZOFRAN) 4 MG tablet Take 1 tablet (4 mg total) by mouth every 8 (eight) hours as needed for nausea or vomiting. 20 tablet 3  . PATADAY 0.2 % SOLN Place 1 drop into both eyes daily as needed (allergies).     . pentosan polysulfate (ELMIRON) 100 MG capsule Take 100 mg by mouth 2 (two) times daily as needed (bladder spasms).     . potassium chloride Roy (K-DUR,KLOR-CON) 20 MEQ tablet Take 40 mEq by mouth 2 (two) times daily.    . RESTASIS 0.05 % ophthalmic emulsion     . Sodium Chloride-Sodium Bicarb (NETI POT SINUS WASH NA) Place 1 Dose into the nose daily as needed (congestion).    .Marland Kitchenspironolactone (ALDACTONE) 25 MG tablet Take 1 tablet (25 mg total) daily by mouth. 90 tablet 3  . torsemide (DEMADEX) 20 MG tablet TAKE 2 TABLETS (40 MG TOTAL) BY MOUTH DAILY. 60 tablet 6  . traZODone (DESYREL) 50 MG tablet Take 50 mg by mouth at bedtime as needed for sleep.     .Marland Kitchentriamcinolone cream (KENALOG) 0.1 % Apply 1 application topically 2 (two) times daily as needed (  eczema).    . venlafaxine XR (EFFEXOR-XR) 37.5 MG 24 hr capsule Take 1 capsule (37.5 mg total) by mouth daily with breakfast. 30 capsule 6  . Vitamin D, Ergocalciferol, (DRISDOL) 50000 units CAPS capsule Take 1 capsule (50,000 Units total) by mouth every 7 (seven) days. 12  capsule 3   No current facility-administered medications on file prior to encounter.      Allergies  Allergen Reactions  . Ace Inhibitors Other (See Comments)    hyperkalemia  . Reglan [Metoclopramide Hcl] Anxiety  . Adhesive [Tape] Itching and Rash    Specifically tegaderm, normal adhesive tape is ok   Review of systems complete and found to be negative unless listed in HPI.    Vital Signs: Vitals:   07/29/17 1117  BP: 97/68  Pulse: 94  SpO2: 99%  Weight: 283 lb 12 oz (128.7 kg)   Wt Readings from Last 3 Encounters:  07/29/17 283 lb 12 oz (128.7 kg)  07/06/17 276 lb (125.2 kg)  06/30/17 269 lb 4.8 oz (122.2 kg)   Body mass index is 44.44 kg/m.  PHYSICAL EXAM: General:  Sitting in [place]  Obese . No resp difficulty HEENT: normal Neck: supple. JVP hard to see. Carotids 2+ bilat; no bruits. No lymphadenopathy or thryomegaly appreciated. Cor: PMI nondisplaced. Regular rate & rhythm. No rubs, gallops or murmurs. Lungs: clear Abdomen: obese soft, nontender, nondistended. No hepatosplenomegaly. No bruits or masses. Good bowel sounds. Extremities: no cyanosis, clubbing, rash, 1+ edema Neuro: alert & orientedx3, cranial nerves grossly intact. moves all 4 extremities w/o difficulty. Affect flat    ASSESSMENT AND PLAN: 1. Chronic systolic CHF: Nonischemic cardiomyopathy.  Medtronic ICD. Echo 5/17 EF 20-25% with moderately dilated LV.   - CPX from 4/18 shows severe HF limitation - significantly decreased from previous - but was done on a day when she had some fluid on board. Given BMI, recurrent breast cancer and comorbidities she is not ideal candidate for advanced therapies. - Chronic NYHA IIIB. Volume status looks elevated. Optivol up but likely inaccurate in setting of recent XRT - Increase torsemide from 40 mg daily to 42m bid x 3 days. If not working well. Can use metolazone as needed.  - Continue coreg 12.5 mg BID. Dose previously reduced due to orthostasis.  - Continue dig  0.0625 mg daily. - Continue spiro to 25 mg daily. BMET today and will need repeat in 10 days.  - Continue losartan 226mdaily. BP too low for Entresto.  - Has run ouf of ivabradine to 7.5 mg BID. Working with PharmD to get funding - Reinforced fluid restriction to < 2 L daily, sodium restriction to less than 2000 mg daily, and the importance of daily weights.   2. Recurrent Left Breast cancer - Please see Dr. MaJana Hakimote on 04/05/17 for further.  - Got 1 dose of fulvestrant but couldn't tolerate. Now on Letrozole.  - Completed XRT 12/18 3. Conversion disorder: - Followed by neurology.  - Started on velafaxine 04/05/2017 4. Fibromyalgia/gout - Follows with Dr. DePatrecia Pour5. Morbid obesity: - Very limited in her activity by her FM and gout.  - Needs to lose weight. Encouraged increased activity as tolerated and limiting portion sizes.  6. Falls - needs PT  DaGlori BickersMD  11:28 AM

## 2017-07-29 NOTE — Progress Notes (Signed)
This encounter was created in error - please disregard.

## 2017-07-29 NOTE — Addendum Note (Signed)
Encounter addended by: Darron Doom, RN on: 07/29/2017 11:59 AM  Actions taken: Order list changed

## 2017-07-29 NOTE — Addendum Note (Signed)
Encounter addended by: Darron Doom, RN on: 07/29/2017 11:53 AM  Actions taken: Order list changed, Diagnosis association updated, Sign clinical note

## 2017-08-02 ENCOUNTER — Ambulatory Visit (INDEPENDENT_AMBULATORY_CARE_PROVIDER_SITE_OTHER): Payer: Medicare Other | Admitting: Internal Medicine

## 2017-08-02 ENCOUNTER — Telehealth: Payer: Self-pay

## 2017-08-02 ENCOUNTER — Ambulatory Visit: Payer: Self-pay | Admitting: Internal Medicine

## 2017-08-02 ENCOUNTER — Encounter (INDEPENDENT_AMBULATORY_CARE_PROVIDER_SITE_OTHER): Payer: Self-pay

## 2017-08-02 ENCOUNTER — Encounter: Payer: Self-pay | Admitting: Internal Medicine

## 2017-08-02 VITALS — BP 92/68 | HR 80 | Ht 67.0 in | Wt 278.0 lb

## 2017-08-02 DIAGNOSIS — R1084 Generalized abdominal pain: Secondary | ICD-10-CM

## 2017-08-02 DIAGNOSIS — R10817 Generalized abdominal tenderness: Secondary | ICD-10-CM

## 2017-08-02 DIAGNOSIS — R194 Change in bowel habit: Secondary | ICD-10-CM

## 2017-08-02 NOTE — Telephone Encounter (Signed)
  Left [name] detailed message about her CT scan date / time: 08/03/17, 4pm, arrive at 3:45pm, NPO after 12 noon, drink the first bottle at 2pm and the second bottle at 3pm.  I told her to call me back and confirm she got this message.

## 2017-08-02 NOTE — Patient Instructions (Addendum)
You have been scheduled for a CT scan of the abdomen and pelvis at [facility] ([address]---this is in the same building as Press [facility]).   You are scheduled on ____________ at __________________. You should arrive 15 minutes prior to your appointment time for registration. Please follow the written instructions below on the day of your exam:  WARNING: IF YOU ARE ALLERGIC TO IODINE/X-RAY DYE, PLEASE NOTIFY RADIOLOGY IMMEDIATELY AT [phone]! YOU WILL BE GIVEN A 13 HOUR PREMEDICATION PREP.  1) Do not eat or drink anything after ________________ (4 hours prior to your test) 2) You have been given 2 bottles of oral contrast to drink. The solution may taste  better if refrigerated, but do NOT add ice or any other liquid to this solution. Shake well before drinking.    Drink 1 bottle of contrast @ _____________ (2 hours prior to your exam)  Drink 1 bottle of contrast @ ________________ (1 hour prior to your exam)  You may take any medications as prescribed with a small amount of water except for the following: Metformin, Glucophage, Glucovance, Avandamet, Riomet, Fortamet, Actoplus Met, Janumet, Glumetza or Metaglip. The above medications must be held the day of the exam AND 48 hours after the exam.  The purpose of you drinking the oral contrast is to aid in the visualization of your intestinal tract. The contrast solution may cause some diarrhea. Before your exam is started, you will be given a small amount of fluid to drink. Depending on your individual set of symptoms, you may also receive an intravenous injection of x-ray contrast/dye. Plan on being at [facility] for 30 minutes or longer, depending on the type of exam you are having performed.  This test typically takes 30-45 minutes to complete.  If you have any questions regarding your exam or if you need to reschedule, you may call the CT department at [phone] between the hours of 8:00 am and 5:00 pm,  Monday-Friday.  ________________________________________________________________________  I appreciate the opportunity to care for you. Silvano Rusk, MD, [facility]    CT tomorrow 08/03/17 at 4pm at [facility] [place] [facility].  Left detailed message on her cell as requested.

## 2017-08-02 NOTE — Progress Notes (Signed)
[name] 47 y.o. Jan 24, 1971 [id number]  Assessment & Plan:   Encounter Diagnoses  Name Primary?  . Generalized abdominal tenderness without rebound tenderness Yes  . Generalized abdominal pain   . Change in bowel habits    High likelihood of IBS in her CT abd/pelvis w/ contrast as ischemia, breast cancer metastases are possible though unlikely. Gut edema from CHF also may play a role.  Could need endoscopic evaluation (at hospital due to co-morbidities) pending CT results. The risks and benefits as well as alternatives of endoscopic procedure(s) have been discussed and reviewed. All questions answered. The patient agrees to proceed.  She has a family history of ovarian and colon cancer in  some different relatives (see family history), this does raise some question of Lynch syndrome I suppose.  She has been seen in the cancer center for her right and left breast cancers.  She may need a genetics evaluation for this.   I appreciate the opportunity to care for her  Mellody Dance, DO Lurline Del, MD Pierre Bali, MD   Subjective:   Chief Complaint: abdominal pain and change in bowel habits  HPI The patient is here for follow-up with her husband, I had seen her in September 2017 with chronic nausea and going to twice daily omeprazole help that.  However since that time over the past several months she is complaining of epigastric pain at the hunger like her ulcer-like pain and sharp or lower abdominal pains that come and go without clear triggers.  She also has alternating diarrhea with urgent defecation and some constipation.  She reports that she herself has interstitial cystitis and fibromyalgia and her whole family has that and IBS also though she has never been given a diagnosis of IBS.  She denies any rectal bleeding.  She cannot relate any triggers such as food, fluid overload with her CHF or medication changes.  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 Allergies  Allergen Reactions  . Ace Inhibitors Other (See Comments)    hyperkalemia  . Reglan [Metoclopramide Hcl] Anxiety  . Adhesive [Tape] Itching and Rash    Specifically tegaderm, normal adhesive tape is ok   Current Meds  Medication Sig  . acetaminophen (TYLENOL) 500 MG tablet Take 1,000 mg by mouth 2 (two) times daily as needed for moderate pain.  Marland Kitchen albuterol (PROVENTIL HFA;VENTOLIN HFA) 108 (90 Base) MCG/ACT inhaler Inhale 1-2 puffs into the lungs every 6 (six) hours as needed for wheezing or shortness of breath.   . allopurinol (ZYLOPRIM) 300 MG tablet Take 300 mg by mouth daily.  . baclofen (LIORESAL) 10 MG tablet TAKE 1 TABLET BY MOUTH 2 (TWO) TIMES DAILY AS NEEDED FOR MUSCLE SPASMS. (Patient taking differently: TAKE 1 TABLET BY MOUTH 3 TIMES DAILY AS NEEDED FOR MUSCLE SPASMS.)  . BLACK CURRANT SEED OIL PO Take 15 mLs by mouth daily.   . Blood Glucose Monitoring Suppl (FREESTYLE FREEDOM LITE) w/Device KIT Check fasting and 2 hour PP  . carvedilol (COREG) 25 MG tablet Take 0.5 tablets (12.5 mg total) by mouth 2 (two) times daily with a meal.  . Cholecalciferol (VITAMIN D3) 5000 UNITS CAPS Take 5,000 Units by mouth daily.   . Coenzyme Q10 (CO Q-10) 100 MG CAPS Take 100 mg by mouth daily.  . Cyanocobalamin 2500 MCG SUBL Place 2,500 mcg under the tongue daily.  Marland Kitchen  digoxin (LANOXIN) 0.125 MG tablet TAKE 0.5 TABLETS (0.0625 MG TOTAL) BY MOUTH DAILY.  . divalproex (DEPAKOTE ER) 500 MG 24 hr tablet Take 1.5 tablets ('750mg'$ ) twice a day  . DULoxetine (CYMBALTA) 30 MG capsule TAKE 1 CAPSULE BY MOUTH TWICE A DAY  . ferrous sulfate 325 (65 FE) MG tablet Take 325 mg by mouth daily with breakfast.  . FREESTYLE UNISTICK II LANCETS MISC Check fasting blood sugar in the morning and check sugar after largest meal of the day.  . gabapentin (NEURONTIN) 100 MG  capsule Take 2 capsules by mouth 3 (three) times daily as needed.   Marland Kitchen GLUCOSAMINE-CHONDROITIN PO Take 1,200 mg by mouth daily.  Marland Kitchen glucose blood (FREESTYLE TEST STRIPS) test strip To be filled with Freedom Lite test strips.   Check fasting blood sugar in the morning and check sugar after largest meal of the day.  . hyaluronate sodium (RADIAPLEXRX) GEL Apply 1 application topically once.  . hydrOXYzine (ATARAX/VISTARIL) 25 MG tablet Take 25 mg by mouth at bedtime as needed for itching (DOESN'T TAKE WITH TRAZODONE).  Marland Kitchen letrozole (FEMARA) 2.5 MG tablet Take 1 tablet (2.5 mg total) by mouth daily.  Marland Kitchen loratadine (CLARITIN) 10 MG tablet Take 10 mg by mouth daily.  Marland Kitchen losartan (COZAAR) 25 MG tablet Take 1 tablet (25 mg total) by mouth daily.  . Menthol, Topical Analgesic, (ICY HOT EX) Apply 1 application topically daily as needed (muscle pain).  Marland Kitchen metolazone (ZAROXOLYN) 2.5 MG tablet Take 1 tablet (2.5 mg total) by mouth daily as needed (edema).  . Milk Thistle 150 MG CAPS Take 450 mg by mouth daily.   . Multiple Vitamin (MULTIVITAMIN WITH MINERALS) TABS Take 1 tablet by mouth daily. Reported on 01/15/6628  . non-metallic deodorant Jethro Poling) MISC Apply 1 application topically daily as needed.  Marland Kitchen omeprazole (PRILOSEC) 40 MG capsule TAKE 1 CAPSULE (40 MG TOTAL) BY MOUTH 2 (TWO) TIMES DAILY BEFORE A MEAL. BREAKFAST AND SUPPER  . ondansetron (ZOFRAN) 4 MG tablet Take 1 tablet (4 mg total) by mouth every 8 (eight) hours as needed for nausea or vomiting.  Marland Kitchen PATADAY 0.2 % SOLN Place 1 drop into both eyes daily as needed (allergies).   . pentosan polysulfate (ELMIRON) 100 MG capsule Take 100 mg by mouth 2 (two) times daily as needed (bladder spasms).   . potassium chloride SA (K-DUR,KLOR-CON) 20 MEQ tablet Take 40 mEq by mouth 2 (two) times daily.  . RESTASIS 0.05 % ophthalmic emulsion   . Sodium Chloride-Sodium Bicarb (NETI POT SINUS WASH NA) Place 1 Dose into the nose daily as needed (congestion).  Marland Kitchen spironolactone  (ALDACTONE) 25 MG tablet Take 1 tablet (25 mg total) daily by mouth.  . torsemide (DEMADEX) 20 MG tablet TAKE 2 TABLETS (40 MG TOTAL) BY MOUTH DAILY.  . traZODone (DESYREL) 50 MG tablet Take 50 mg by mouth at bedtime as needed for sleep.   Marland Kitchen triamcinolone cream (KENALOG) 0.1 % Apply 1 application topically 2 (two) times daily as needed (eczema).  . venlafaxine XR (EFFEXOR-XR) 37.5 MG 24 hr capsule Take 1 capsule (37.5 mg total) by mouth daily with breakfast.  . Vitamin D, Ergocalciferol, (DRISDOL) 50000 units CAPS capsule Take 1 capsule (50,000 Units total) by mouth every 7 (seven) days.   Past Medical History:  Diagnosis Date  . Acne   . AICD (automatic cardioverter/defibrillator) present   . Anemia 1980's   [id number]  . Anginal pain ([place])    sporatic , been going on for yrs,  .  Anxiety   . Arthritis   . 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 Blood transfusion 1980's   1987 or 1988  . Breast calcifications    right breast  . Breast cancer (H[facility])    right;  . 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 Cardiac MRI 05/2011 EF 32%. ICD implantation 10/2011 ([employer])  . 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 Conversion disorder   . 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 Depression   . Endometriosis   . Family history of colon cancer   . Family history of ovarian cancer   . Fibromyalgia   . GERD (gastroesophageal reflux disease)   . Headache   . Hepatomegaly   . History of radiation therapy 05/09/17- 06/08/2017   Left Breast treated to 40.05 Gy in 15 fractions of 2.67 Gy, followed by a 10 gy boost in 5 fractions to yield a total dose of 50.05 Gy to the Left Breast  . History of stomach ulcers   . Hyperlipidemia   . Hypertension    c/b orthostatic hypotention  . ICD (implantable cardiac defibrillator) in place   . Insomnia   . Interstitial cystitis   . Left eye injury 12/2014    . 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   . Nonischemic cardiomyopathy ([place])    related to chemo; EF 30% 05/2011  . Obesity   . Palpitation    normal sinus rhythm only on 21 day heart monitor  . Panic attacks   . Peripheral neuropathy    chemo- induced  . Pneumonia 2000's   "once"  . Seasonal allergies   . Seizures ([place])    last seizure  last week: non elipsey seisure  . Type II diabetes mellitus ([place])    Past Surgical History:  Procedure Laterality Date  . ABDOMINAL HYSTERECTOMY     partial  . BREAST EXCISIONAL BIOPSY     right benign 2012  . BREAST LUMPECTOMY  2008; 2013   right radiation  . BREAST LUMPECTOMY WITH RADIOACTIVE SEED AND SENTINEL LYMPH NODE BIOPSY Left 02/21/2017   Procedure: LEFT BREAST LUMPECTOMY WITH RADIOACTIVE SEED AND LEFT AXILLARY DEEP SENTINEL LYMPH NODE BIOPSY;  Surgeon: Fanny Skates, MD;  Location: [facility];  Service: General;  Laterality: Left;  . CARDIAC DEFIBRILLATOR PLACEMENT  11/03/11  . CESAREAN SECTION    . CHOLECYSTECTOMY  ~ 2009  . COLONOSCOPY    . ICD REVISION N/A 02/21/2017   Procedure: ICD Revision;  Surgeon: Deboraha Sprang, MD;  Location: [facility];  Service: Cardiovascular;  Laterality: N/A;  . IMPLANTABLE CARDIOVERTER DEFIBRILLATOR IMPLANT N/A 11/03/2011   Procedure: IMPLANTABLE CARDIOVERTER DEFIBRILLATOR IMPLANT;  Surgeon: Deboraha Sprang, MD; MDT   . LAPAROSCOPIC ENDOMETRIOSIS FULGURATION  1990's  . NASAL SINUS SURGERY    . [place] REMOVAL  2010?   left chest; placed in 2008  . TONSILLECTOMY  1980's  . TUBAL LIGATION  1990's   Social History   Social History Narrative   Patient is married [name]) and lives at home with her family.   Patient has one child. A daughter   Patient is right-handed.   Patient has a college education.   Patient drinks some caffeine occasionally, but not everyday.   Regular exercise   She is disabled   02/23/2016    Updated   family history includes Arthritis in her  mother; Colon cancer in her paternal uncle; Coronary artery disease in her unknown relative; Fibromyalgia in her mother and sister; Heart attack in her maternal grandmother and unknown relative; Heart disease in her maternal grandmother, maternal uncle, and mother; Hypertension in her mother; Ovarian cancer in her paternal aunt and paternal grandmother; Stroke in her mother.   Review of Systems R Leg pain after fall She was seen in CHF clinic on the 15th and took 3 extra doses are higher doses of torsemide due to an elevated BNP  Objective:   Physical Exam '@BP'$  92/68   Pulse 80   Ht '5\' 7"'$  (1.702 m)   Wt 278 lb (126.1 kg)   BMI 43.54 kg/m @  General:  NAD, obese Eyes:   anicteric Lungs:  clear Heart::  S1S2 no rubs, murmurs or gallops Abdomen:  soft and obese, mildly diffusely tender, BS+, no edema Ext:   R>L tr-1+ ankle edema, w/o cyanosis or clubbing    Data Reviewed:   Oncology notes, prior GI notes CHF clinic notes labs in the computer Brain natruretic peptide has been elevated and her CBC was normal.

## 2017-08-02 NOTE — Telephone Encounter (Signed)
Spoke with [employer] and she confirmed date/time of her appointment tomorrow for her CT scan.

## 2017-08-03 ENCOUNTER — Ambulatory Visit (INDEPENDENT_AMBULATORY_CARE_PROVIDER_SITE_OTHER)
Admission: RE | Admit: 2017-08-03 | Discharge: 2017-08-03 | Disposition: A | Discharge status: Home / Self Care | Payer: Medicare Other | Source: Ambulatory Visit | Attending: Internal Medicine | Admitting: Internal Medicine

## 2017-08-03 DIAGNOSIS — R1084 Generalized abdominal pain: Secondary | ICD-10-CM

## 2017-08-03 DIAGNOSIS — R10817 Generalized abdominal tenderness: Secondary | ICD-10-CM | POA: Diagnosis not present

## 2017-08-03 DIAGNOSIS — M47812 Spondylosis without myelopathy or radiculopathy, cervical region: Secondary | ICD-10-CM | POA: Diagnosis not present

## 2017-08-03 DIAGNOSIS — M62830 Muscle spasm of back: Secondary | ICD-10-CM | POA: Diagnosis not present

## 2017-08-03 DIAGNOSIS — M47817 Spondylosis without myelopathy or radiculopathy, lumbosacral region: Secondary | ICD-10-CM | POA: Diagnosis not present

## 2017-08-03 DIAGNOSIS — G894 Chronic pain syndrome: Secondary | ICD-10-CM | POA: Diagnosis not present

## 2017-08-03 DIAGNOSIS — K760 Fatty (change of) liver, not elsewhere classified: Secondary | ICD-10-CM | POA: Diagnosis not present

## 2017-08-03 MED ORDER — IOPAMIDOL (ISOVUE-300) INJECTION 61%
100.0000 mL | Freq: Once | INTRAVENOUS | Status: AC | PRN
  Administered 2017-08-03: 100 mL via INTRAVENOUS

## 2017-08-04 ENCOUNTER — Other Ambulatory Visit: Payer: Self-pay

## 2017-08-04 DIAGNOSIS — R194 Change in bowel habit: Secondary | ICD-10-CM

## 2017-08-04 DIAGNOSIS — R109 Unspecified abdominal pain: Secondary | ICD-10-CM

## 2017-08-04 MED ORDER — DICYCLOMINE HCL 20 MG PO TABS: 20.0000 mg | ORAL_TABLET | Freq: Four times a day (QID) | ORAL | 0 refills | Status: AC

## 2017-08-04 NOTE — Progress Notes (Signed)
CT scan does not show anything bad Liver is abnormal as in past - probably from congestion due to heart failure   She needs: 1) EGD dx generalized abdominal pain 2) Colonoscopy dx change in bowel habits 3) Procedures at hospital w/ MAC 4) dicyclomine 20 mg q 6 prn abdominal pain # 60 no refill

## 2017-08-05 ENCOUNTER — Ambulatory Visit
Admission: RE | Admit: 2017-08-05 | Discharge: 2017-08-05 | Disposition: A | Discharge status: Home / Self Care | Payer: Medicare Other | Source: Ambulatory Visit | Attending: Radiation Oncology | Admitting: Radiation Oncology

## 2017-08-05 ENCOUNTER — Ambulatory Visit: Payer: Medicare Other | Admitting: Psychology

## 2017-08-05 DIAGNOSIS — G894 Chronic pain syndrome: Secondary | ICD-10-CM | POA: Diagnosis not present

## 2017-08-05 DIAGNOSIS — M47812 Spondylosis without myelopathy or radiculopathy, cervical region: Secondary | ICD-10-CM | POA: Diagnosis not present

## 2017-08-05 DIAGNOSIS — M62830 Muscle spasm of back: Secondary | ICD-10-CM | POA: Diagnosis not present

## 2017-08-05 DIAGNOSIS — M47817 Spondylosis without myelopathy or radiculopathy, lumbosacral region: Secondary | ICD-10-CM | POA: Diagnosis not present

## 2017-08-05 HISTORY — DX: Personal history of irradiation: Z92.3

## 2017-08-05 NOTE — Progress Notes (Incomplete)
Radiation Oncology         [phone]) [phone] ________________________________  Name: [name] MRN: [id number]  Date: 08/05/2017  DOB: 14-Aug-1970  Follow-Up Visit Note  Outpatient  CC: Mellody Dance, DO  Magrinat, Virgie Dad, MD  Diagnosis and Prior Radiotherapy: No diagnosis found.  [id number] Stage I Invasive Ductal Carcinoma of the left breast, ER 100% positive/ PR 0% (NEG)/ Her2 (NEG), Ki67 (10%), Grade 2   05/09/2017 - 06/08/2017: The left breast was treated to 40.05 Gy in 15 fractions of 2.67 Gy, followed by a 10 Gy boost in 5 fractions to yield a total dose of 50.05 Gy to the left breast.  CHIEF COMPLAINT: Here for follow-up and surveillance of left breast cancer  Narrative:  The patient returns today for routine follow-up of radiation completed two months ago to her left breast. The patient tolerated radiation treatment relatively well with moderate fatigue and some skin irritation. She had hyperpigmentation present to her left breast and axilla and used Radiaplex as directed.                        ALLERGIES:  is allergic to ace inhibitors; reglan [metoclopramide hcl]; and adhesive [tape].  Meds: Current Outpatient Medications  Medication Sig Dispense Refill   acetaminophen (TYLENOL) 500 MG tablet Take 1,000 mg by mouth 2 (two) times daily as needed for moderate pain.     albuterol (PROVENTIL HFA;VENTOLIN HFA) 108 (90 Base) MCG/ACT inhaler Inhale 1-2 puffs into the lungs every 6 (six) hours as needed for wheezing or shortness of breath.      allopurinol (ZYLOPRIM) 300 MG tablet Take 300 mg by mouth daily.  2   baclofen (LIORESAL) 10 MG tablet TAKE 1 TABLET BY MOUTH 2 (TWO) TIMES DAILY AS NEEDED FOR MUSCLE SPASMS. (Patient taking differently: TAKE 1 TABLET BY MOUTH 3 TIMES DAILY AS NEEDED FOR MUSCLE SPASMS.) 60 tablet 0   BLACK CURRANT SEED OIL PO Take 15 mLs by mouth daily.      Blood Glucose Monitoring Suppl (FREESTYLE FREEDOM LITE) w/Device KIT Check fasting and 2  hour PP 1 each 1   carvedilol (COREG) 25 MG tablet Take 0.5 tablets (12.5 mg total) by mouth 2 (two) times daily with a meal. 60 tablet 6   Cholecalciferol (VITAMIN D3) 5000 UNITS CAPS Take 5,000 Units by mouth daily.      Coenzyme Q10 (CO Q-10) 100 MG CAPS Take 100 mg by mouth daily.     Cyanocobalamin 2500 MCG SUBL Place 2,500 mcg under the tongue daily.     dicyclomine (BENTYL) 20 MG tablet Take 1 tablet (20 mg total) by mouth every 6 (six) hours. 60 tablet 0   digoxin (LANOXIN) 0.125 MG tablet TAKE 0.5 TABLETS (0.0625 MG TOTAL) BY MOUTH DAILY. 15 tablet 2   divalproex (DEPAKOTE ER) 500 MG 24 hr tablet Take 1.5 tablets ('750mg'$ ) twice a day 135 tablet 1   DULoxetine (CYMBALTA) 30 MG capsule TAKE 1 CAPSULE BY MOUTH TWICE A DAY 60 capsule 11   ferrous sulfate 325 (65 FE) MG tablet Take 325 mg by mouth daily with breakfast.     FREESTYLE UNISTICK II LANCETS MISC Check fasting blood sugar in the morning and check sugar after largest meal of the day. 100 each 12   gabapentin (NEURONTIN) 100 MG capsule Take 2 capsules by mouth 3 (three) times daily as needed.   2   GLUCOSAMINE-CHONDROITIN PO Take 1,200 mg by mouth daily.     glucose  blood (FREESTYLE TEST STRIPS) test strip To be filled with Freedom Lite test strips.   Check fasting blood sugar in the morning and check sugar after largest meal of the day. 100 each 12   hyaluronate sodium (RADIAPLEXRX) GEL Apply 1 application topically once.     hydrOXYzine (ATARAX/VISTARIL) 25 MG tablet Take 25 mg by mouth at bedtime as needed for itching (DOESN'T TAKE WITH TRAZODONE).     letrozole (FEMARA) 2.5 MG tablet Take 1 tablet (2.5 mg total) by mouth daily. 90 tablet 4   loratadine (CLARITIN) 10 MG tablet Take 10 mg by mouth daily.     losartan (COZAAR) 25 MG tablet Take 1 tablet (25 mg total) by mouth daily. 90 tablet 3   Menthol, Topical Analgesic, (ICY HOT EX) Apply 1 application topically daily as needed (muscle pain).     metolazone  (ZAROXOLYN) 2.5 MG tablet Take 1 tablet (2.5 mg total) by mouth daily as needed (edema). 15 tablet 1   Milk Thistle 150 MG CAPS Take 450 mg by mouth daily.      Multiple Vitamin (MULTIVITAMIN WITH MINERALS) TABS Take 1 tablet by mouth daily. Reported on 4/0/8144     non-metallic deodorant (ALRA) MISC Apply 1 application topically daily as needed.     omeprazole (PRILOSEC) 40 MG capsule TAKE 1 CAPSULE (40 MG TOTAL) BY MOUTH 2 (TWO) TIMES DAILY BEFORE A MEAL. BREAKFAST AND SUPPER 60 capsule 9   ondansetron (ZOFRAN) 4 MG tablet Take 1 tablet (4 mg total) by mouth every 8 (eight) hours as needed for nausea or vomiting. 20 tablet 3   PATADAY 0.2 % SOLN Place 1 drop into both eyes daily as needed (allergies).      pentosan polysulfate (ELMIRON) 100 MG capsule Take 100 mg by mouth 2 (two) times daily as needed (bladder spasms).      potassium chloride SA (K-DUR,KLOR-CON) 20 MEQ tablet Take 40 mEq by mouth 2 (two) times daily.     RESTASIS 0.05 % ophthalmic emulsion      Sodium Chloride-Sodium Bicarb (NETI POT SINUS WASH NA) Place 1 Dose into the nose daily as needed (congestion).     spironolactone (ALDACTONE) 25 MG tablet Take 1 tablet (25 mg total) daily by mouth. 90 tablet 3   torsemide (DEMADEX) 20 MG tablet TAKE 2 TABLETS (40 MG TOTAL) BY MOUTH DAILY. 60 tablet 6   traZODone (DESYREL) 50 MG tablet Take 50 mg by mouth at bedtime as needed for sleep.      triamcinolone cream (KENALOG) 0.1 % Apply 1 application topically 2 (two) times daily as needed (eczema).     venlafaxine XR (EFFEXOR-XR) 37.5 MG 24 hr capsule Take 1 capsule (37.5 mg total) by mouth daily with breakfast. 30 capsule 6   Vitamin D, Ergocalciferol, (DRISDOL) 50000 units CAPS capsule Take 1 capsule (50,000 Units total) by mouth every 7 (seven) days. 12 capsule 3   No current facility-administered medications for this encounter.     Physical Findings: The patient is in no acute distress. Patient is alert and  oriented.  vitals were not taken for this visit. .    Satisfactory skin healing in radiotherapy fields. ***  General: Alert and oriented, in no acute distress HEENT: Head is normocephalic. Extraocular movements are intact. Oropharynx is clear. Neck: Neck is supple, no palpable cervical or supraclavicular lymphadenopathy. Heart: Regular in rate and rhythm with no murmurs, rubs, or gallops. Chest: Clear to auscultation bilaterally, with no rhonchi, wheezes, or rales. Abdomen: Soft, nontender, nondistended,  with no rigidity or guarding. Extremities: No cyanosis or edema. Lymphatics: see Neck Exam Skin: No concerning lesions. Musculoskeletal: symmetric strength and muscle tone throughout. Neurologic: Cranial nerves II through XII are grossly intact. No obvious focalities. Speech is fluent. Coordination is intact. Psychiatric: Judgment and insight are intact. Affect is appropriate.    Lab Findings: Lab Results  Component Value Date   WBC 4.4 07/06/2017   HGB 13.0 07/06/2017   HCT 39.5 07/06/2017   MCV 101 (H) 07/06/2017   PLT 155 07/06/2017    Radiographic Findings: Ct Abdomen Pelvis W Contrast  Result Date: 08/03/2017 CLINICAL DATA:  Abdominal and pelvic pain for 1.5 months. Nausea. Diarrhea alternating with constipation. Personal history of bilateral breast carcinoma. EXAM: CT ABDOMEN AND PELVIS WITH CONTRAST TECHNIQUE: Multidetector CT imaging of the abdomen and pelvis was performed using the standard protocol following bolus administration of intravenous contrast. CONTRAST:  133m ISOVUE-300 IOPAMIDOL (ISOVUE-300) INJECTION 61% COMPARISON:  04/01/2015 FINDINGS: Lower Chest: No acute findings.  Stable cardiomegaly. Hepatobiliary: No hepatic masses identified. Vascular shunts again seen in the medial segment of the left hepatic lobe. Stable sub-cm benign-appearing cyst in lateral dome of right lobe. Diffuse hepatic steatosis again demonstrated. Enlargement of caudate and left lobes and  mild capsular nodularity, suspicious for hepatic cirrhosis. Prior cholecystectomy. No evidence of biliary obstruction. Pancreas:  No mass or inflammatory changes. Spleen: Within normal limits in size and appearance. Adrenals/Urinary Tract: No masses identified. No evidence of hydronephrosis. Stomach/Bowel: No evidence of obstruction, inflammatory process or abnormal fluid collections. Vascular/Lymphatic: No pathologically enlarged lymph nodes. No abdominal aortic aneurysm. Stable distention of IVC and hepatic veins, consistent with passive congestion secondary to right heart insufficiency. Reproductive: Prior hysterectomy noted. Adnexal regions are unremarkable in appearance. Other:  None. Musculoskeletal:  No suspicious bone lesions identified. IMPRESSION: No acute findings. Hepatic steatosis and probable hepatic cirrhosis. No evidence of hepatic neoplasm. Chronic distention of IVC and hepatic veins, consistent with passive congestion due to right heart insufficiency. Electronically Signed   By: JEarle GellM.D.   On: 08/03/2017 17:02    Impression/Plan: Healing well from radiotherapy to the breast tissue.  Continue skin care with topical Vitamin E oil and/or lotion for at least 2 more months for further healing.  I encouraged her to continue with yearly mammography as appropriate (for intact breast tissue) and followup with medical oncology. I will see her back on an as-needed basis. I have encouraged her to call if she has any issues or concerns in the future. I wished her the very best.   I spent {CHL ONC TIME VISIT - SOVFIE:[id number]}face to face with the patient and more than 50% of that time was spent in counseling and/or coordination of care. _____________________________________   SEppie Gibson MD  This document serves as a record of services personally performed by SEppie Gibson MD. It was created on her behalf by HRae Lips a trained medical scribe. The creation of this record is  based on the scribe's personal observations and the provider's statements to them. This document has been checked and approved by the attending provider.

## 2017-08-08 ENCOUNTER — Telehealth: Payer: Self-pay | Admitting: Family Medicine

## 2017-08-08 NOTE — Telephone Encounter (Signed)
Patient left voice message wishing provider or nurse to call her back, states had a fall about 1-2 wks ago, still in pain--- Pls call at [phone].  --glh

## 2017-08-08 NOTE — Telephone Encounter (Signed)
Patient states that she fell 2 weeks ago, hitting her right ankle on a linoleum floor.  She experienced swelling and it hurt to walk.  She tried an ACE bandage, but that made the pain worse d/t the swelling.  Patient states that it is still very tender to touch, persistent difficulty walking.  Advised pt needs OV for eval and possible xrays.  Pt expressed understanding and is agreeable.  Pt transferred to front desk to schedule appt.  Charyl Bigger, CMA

## 2017-08-09 ENCOUNTER — Ambulatory Visit: Payer: Self-pay | Admitting: Family Medicine

## 2017-08-10 ENCOUNTER — Ambulatory Visit: Payer: Self-pay | Admitting: Family Medicine

## 2017-08-12 ENCOUNTER — Encounter: Payer: Self-pay | Admitting: Family Medicine

## 2017-08-12 ENCOUNTER — Encounter (INDEPENDENT_AMBULATORY_CARE_PROVIDER_SITE_OTHER): Payer: Self-pay | Admitting: Orthopaedic Surgery

## 2017-08-12 ENCOUNTER — Ambulatory Visit (INDEPENDENT_AMBULATORY_CARE_PROVIDER_SITE_OTHER): Payer: Medicare Other | Admitting: Orthopaedic Surgery

## 2017-08-12 ENCOUNTER — Ambulatory Visit: Payer: Medicare Other

## 2017-08-12 ENCOUNTER — Ambulatory Visit (INDEPENDENT_AMBULATORY_CARE_PROVIDER_SITE_OTHER): Payer: Medicare Other | Admitting: Family Medicine

## 2017-08-12 ENCOUNTER — Ambulatory Visit: Payer: Medicare Other | Admitting: Radiation Oncology

## 2017-08-12 VITALS — BP 110/60 | HR 102 | Ht 67.0 in | Wt 273.0 lb

## 2017-08-12 VITALS — BP 110/60 | Ht 67.0 in | Wt 271.0 lb

## 2017-08-12 DIAGNOSIS — W19XXXA Unspecified fall, initial encounter: Secondary | ICD-10-CM

## 2017-08-12 DIAGNOSIS — M25571 Pain in right ankle and joints of right foot: Secondary | ICD-10-CM | POA: Diagnosis not present

## 2017-08-12 DIAGNOSIS — S82891A Other fracture of right lower leg, initial encounter for closed fracture: Secondary | ICD-10-CM | POA: Diagnosis not present

## 2017-08-12 DIAGNOSIS — S82831A Other fracture of upper and lower end of right fibula, initial encounter for closed fracture: Secondary | ICD-10-CM

## 2017-08-12 DIAGNOSIS — S8261XA Displaced fracture of lateral malleolus of right fibula, initial encounter for closed fracture: Secondary | ICD-10-CM | POA: Diagnosis not present

## 2017-08-12 NOTE — Progress Notes (Signed)
Office Visit Note   Patient: [name]           Date of Birth: Oct 23, 1970           MRN: [id number] Visit Date: 08/12/2017              Requested by: Mellody Dance, DO [address], [place] [id number] PCP: Mellody Dance, DO   Assessment & Plan: Visit Diagnoses:  1. Displaced fracture of lateral malleolus of right fibula, initial encounter for closed fracture     Plan: Extension discussion was held with the patient and also with her husband is present concerning options for surgery versus nonsurgical treatment.  The patient states at this point she is not able to make a decision and will proceed with cast immobilization and recheck her in 1 week for further discussion.  Prescription given for folding walker 4-prong.  She can look at getting a knee roller.  She already has a wheelchair at home.  She will keep her foot elevated for the swelling which we discussed.  She already has a narcotic medicine for pain management.  Repeat exam in 1 week no repeat x-ray needed on return visit next week.  Questions were elicited and answered  Follow-Up Instructions: Return in about 1 week (around 08/19/2017).   Orders:  No orders of the defined types were placed in this encounter.  No orders of the defined types were placed in this encounter.     Procedures: No procedures performed   Clinical Data: No additional findings.   Subjective: Chief Complaint  Patient presents with  . Right Ankle - Fracture    HPI 47 year old female with complex medical history including breast cancer 2008 on one side in 2018 on the other.  Previous chemotherapy with secondary cardiomyopathy, history of systolic heart failure, vitamin D deficiency, fibromyalgia, currently on oxycodone 10 mg every 8 for pain management, increased BMI with BMI 42.4 injured herself at home 3 weeks ago and has had persistent pain hopping on the ankle with pain laterally.  She was seen today by Dr. Raliegh Scarlet and  x-rays demonstrated oblique distal fibular fracture Weber class B.  Patient denies any medial pain over the deltoid ligament.  Review of Systems 14 point review of systems performed see above mentioned multisystem involvement.  Type 2 diabetes diet controlled.   Objective: Vital Signs: BP 110/60   Ht 5\' 7"  (1.702 m)   Wt 271 lb (122.9 kg)   BMI 42.44 kg/m   Physical Exam  Constitutional: She is oriented to person, place, and time. She appears well-developed.  HENT:  Head: Normocephalic.  Right Ear: External ear normal.  Left Ear: External ear normal.  Eyes: Pupils are equal, round, and reactive to light.  Neck: No tracheal deviation present. No thyromegaly present.  Cardiovascular: Normal rate.  Defibrillator right anterior chest wall.  Pulmonary/Chest: Effort normal.  Abdominal: Soft.  Neurological: She is alert and oriented to person, place, and time.  Skin: Skin is warm and dry.  Psychiatric: She has a normal mood and affect. Her behavior is normal.    Ortho Exam patient has tenderness over the distal fibula.  This is a closed fracture. No tenderness over the deltoid ligament medially.  She has some swelling of the foot and ankle.  Ecchymosis has resolved.  Plantar surface of her foot is intact Specialty Comments:  No specialty comments available.  Imaging: Dg Ankle Complete Right  Result Date: 08/12/2017 CLINICAL DATA:  Fall.  Acute ankle  pain. EXAM: RIGHT ANKLE - COMPLETE 3+ VIEW COMPARISON:  No recent. FINDINGS: Oblique displaced fracture of the distal fibular diaphysis is noted. Medial malleolus and posterior malleolus appear to be intact. Diffuse degenerative change. IMPRESSION: Oblique displaced fracture of the distal fibular diaphysis. Electronically Signed   By: Marcello Moores  Register   On: 08/12/2017 12:50     PMFS History: Patient Active Problem List   Diagnosis Date Noted  . Hyperlipidemia associated with type 2 diabetes mellitus ([place]) 03/22/2017  . Adjustment  disorder with mixed anxiety and depressed mood 03/22/2017  . Left breast mass- path c/w CA- Dr Dalbert Batman 02/07/2017  . Fibromyalgia syndrome 02/07/2017  . S/P placement of cardiac pacemaker 02/07/2017  . Genetic testing 02/03/2017  . Family history of ovarian cancer   . Family history of colon cancer   . Malignant neoplasm of overlapping sites of right breast in female, estrogen receptor positive ([place]) 12/28/2016  . Malignant neoplasm of upper-inner quadrant of left breast in female, estrogen receptor positive ([place]) 12/22/2016  . History of malignant neoplasm of breast 09/01/2015  . Conversion disorder with abnormal movement 12/25/2014  . Convulsions/seizures ([place]) 12/25/2014  . Myoclonus 12/20/2014  . Acute on chronic systolic CHF (congestive heart failure) ([place]) 11/27/2014  . ICD in place (MDT 2013) 11/27/2014  . Near syncope 11/26/2014  . Stomach discomfort 10/14/2014  . Generalized anxiety disorder 07/08/2014  . Physical deconditioning 06/24/2014  . Fibromyalgia affecting multiple sites 05/29/2014  . Chronic pain not due to malignancy 05/29/2014  . Dyspnea 01/07/2014  . Chronic sinusitis 01/07/2014  . Conversion disorder 09/03/2013  . Neuropathy due to drug ([place]) 04/27/2013  . 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 Uncontrolled persistent asthma 11/15/2012  . Orthostatic hypotension 05/18/2012  . Keloid 03/14/2012  . Single implantable cardioverter-defibrillator in situ 11/03/2011  . Vitamin D deficiency 08/19/2011  . Weakness 07/15/2011  . Diabetes mellitus, type II ([place]) 03/24/2011  . Chest pain 12/09/2010  . GERD (gastroesophageal reflux disease) 12/09/2010  . Secondary cardiomyopathy s/p chemo therapy 04/01/2009  . FATIGUE / MALAISE 10/02/2008  . Systolic heart failure ([place]) 08/25/2008  . Obesity, Class III, BMI 40-49.9 (morbid obesity) ([place]) 04/29/2008  . Hypertension associated with diabetes ([place]) 04/29/2008  . DIFFICULTY IN  [facility] 04/29/2008  . DIZZINESS 04/29/2008  . NUMBNESS 04/29/2008  . EDEMA 04/29/2008  . ABNORMAL WEIGHT GAIN 04/29/2008  . PALPITATIONS 04/29/2008  . ORTHOPNEA 04/29/2008  . DYSPNEA 04/29/2008  . COUGH 04/29/2008   Past Medical History:  Diagnosis Date  . Acne   . AICD (automatic cardioverter/defibrillator) present   . Anemia 1980's   [id number]  . Anginal pain ([place])    sporatic , been going on for yrs,  . Anxiety   . Arthritis   . 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 Blood transfusion 1980's   1987 or 1988  . Breast calcifications    right breast  . Breast cancer (HCC)    right;  . CHF (congestive heart failure) ([place])    due to non-ischemic cardiomyopathy, thought to be chemotherapy induced;  cath 7/12: normal cors, EF 20-25%. Cardiac MRI 05/2011 EF 32%. ICD implantation 10/2011 (Medtronic)  . 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 Conversion disorder   . 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 Depression   . Endometriosis   . Family history of colon cancer   . Family history of ovarian cancer   . Fibromyalgia   . GERD (gastroesophageal reflux disease)   . Headache   .  Hepatomegaly   . History of radiation therapy 05/09/17- 06/08/2017   Left Breast treated to 40.05 Gy in 15 fractions of 2.67 Gy, followed by a 10 gy boost in 5 fractions to yield a total dose of 50.05 Gy to the Left Breast  . History of stomach ulcers   . Hyperlipidemia   . Hypertension    c/b orthostatic hypotention  . ICD (implantable cardiac defibrillator) in place   . Insomnia   . Interstitial cystitis   . Left eye injury 12/2014  . 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   . Nonischemic cardiomyopathy ([place])    related to chemo; EF 30% 05/2011  . Obesity   . Palpitation    normal sinus rhythm only on 21 day heart monitor  . Panic attacks   . Peripheral neuropathy    chemo- induced  . Pneumonia 2000's   "once"  . Seasonal  allergies   . Seizures ([place])    last seizure  last week: non elipsey seisure  . Type II diabetes mellitus (HCC)     Family History  Problem Relation Age of Onset  . Heart disease Mother   . Arthritis Mother   . Hypertension Mother   . Stroke Mother   . Fibromyalgia Mother   . Coronary artery disease Unknown   . Heart attack Unknown   . Heart disease Maternal Uncle   . Colon cancer Paternal Uncle        dx over 52  . Heart disease Maternal Grandmother   . Heart attack Maternal Grandmother   . Ovarian cancer Paternal Grandmother   . Fibromyalgia Sister   . Ovarian cancer Paternal Aunt     Past Surgical History:  Procedure Laterality Date  . ABDOMINAL HYSTERECTOMY     partial  . BREAST EXCISIONAL BIOPSY     right benign 2012  . BREAST LUMPECTOMY  2008; 2013   right radiation  . BREAST LUMPECTOMY WITH RADIOACTIVE SEED AND SENTINEL LYMPH NODE BIOPSY Left 02/21/2017   Procedure: LEFT BREAST LUMPECTOMY WITH RADIOACTIVE SEED AND LEFT AXILLARY DEEP SENTINEL LYMPH NODE BIOPSY;  Surgeon: Fanny Skates, MD;  Location: [place];  Service: General;  Laterality: Left;  . CARDIAC DEFIBRILLATOR PLACEMENT  11/03/11  . CESAREAN SECTION    . CHOLECYSTECTOMY  ~ 2009  . COLONOSCOPY    . ICD REVISION N/A 02/21/2017   Procedure: ICD Revision;  Surgeon: Deboraha Sprang, MD;  Location: [facility];  Service: Cardiovascular;  Laterality: N/A;  . IMPLANTABLE CARDIOVERTER DEFIBRILLATOR IMPLANT N/A 11/03/2011   Procedure: IMPLANTABLE CARDIOVERTER DEFIBRILLATOR IMPLANT;  Surgeon: Deboraha Sprang, MD; MDT   . LAPAROSCOPIC ENDOMETRIOSIS FULGURATION  1990's  . NASAL SINUS SURGERY    . [place] REMOVAL  2010?   left chest; placed in 2008  . TONSILLECTOMY  1980's  . TUBAL LIGATION  1990's   Social History   Occupational History  . Occupation: Disablity  Tobacco Use  . Smoking status: Never Smoker  . Smokeless tobacco: Never Used  Substance and Sexual Activity  . Alcohol use: Yes    Comment:  social drinker   . Drug use: No  . Sexual activity: Not on file

## 2017-08-12 NOTE — Progress Notes (Addendum)
Pt here for an acute care OV today   Impression and Recommendations:    1. Other closed fracture of distal end of right fibula, initial encounter   2. Acute right ankle pain   3. Fall, initial encounter    1. Distal Right Fibula Fracture  - X-ray obtained today to evaluate R ankle pain.  - Educated the patient today about the nature of her distal fibular fracture.  Discussed that the weight bearing bone is the tibia, not the fibula, which is why she has been able to walk for 3 weeks on her leg.    - referral to orthopedics for fracture care- she will go today for appointment with Dr. Lorin Mercy of [facility]  - In the meantime, the patient should elevate her leg as much as possible to reduce swelling, and ice it as much as possible.   Orders Placed This Encounter  Procedures  . DG Ankle Complete Right    Education and routine counseling performed. Handouts provided  Gross side effects, risk and benefits, and alternatives of medications and treatment plan in general discussed with patient.  Patient is aware that all medications have potential side effects and we are unable to predict every side effect or drug-drug interaction that may occur.   Patient will call with any questions prior to using medication if they have concerns.  Expresses verbal understanding and consents to current therapy and treatment regimen.  No barriers to understanding were identified.  Red flag symptoms and signs discussed in detail.  Patient expressed understanding regarding what to do in case of emergency\urgent symptoms   Please see AVS handed out to patient at the end of our visit for further patient instructions/ counseling done pertaining to today's office visit.   Return for keep f/up appt for chronic care; go to Ortho now for fx care.  Last chronic care office visit patient was told to return around 10/28/2017 for follow-up of her blood pressure, diabetes and cholesterol etc.  Told patient to  keep this appointment for chronic care in future  Note: This document was prepared occasionally using Dragon voice recognition software and may include unintentional dictation errors in addition to a scribe.  This document serves as a record of services personally performed by Mellody Dance, DO. It was created on her behalf by Toni Amend, a trained medical scribe. The creation of this record is based on the scribe's personal observations and the provider's statements to them.   I have reviewed the above medical documentation for accuracy and completeness and I concur.  Mellody Dance 08/12/17 12:23 PM   ----------------------------------------------------------------------------------------------------------------------------------------------------------------------------------   Subjective:    CC:  Chief Complaint  Patient presents with  . Ankle Injury    fell 3 weeks ago, right ankle pain     HPI: [name] is a 47 y.o. female who presents to [place] at [facility] today for issues as discussed below.  Acute Right Ankle Pain (from fall 3 weeks ago today)  At first she thought that it was just a sprain in her ankle, but the pain persists. She's never had an experience like this before.  She fell.  Notes several times that her leg "just fell out from under her."  When she fell, she was walking her daughter outside.  Patient describes the moment:  She was turning around to go back in the house, her "legs went plumb from under her," and she was lying on her side against the  outside right ankle.  Notes that when she fell, she hit on her ankle, and the pain went up past her knee at that time.  Outside ankle is tender to palpation.  Inside is tender "but not like the outside."  Feels that her ankle is feeling better than it was before, but she still can't put pressure on her ankle, and still can't put a shoe on due to pain    Wt Readings from  Last 3 Encounters:  08/12/17 273 lb (123.8 kg)  08/02/17 278 lb (126.1 kg)  07/29/17 283 lb 12 oz (128.7 kg)   BP Readings from Last 3 Encounters:  08/12/17 110/60  08/02/17 92/68  07/29/17 97/68   BMI Readings from Last 3 Encounters:  08/12/17 42.76 kg/m  08/02/17 43.54 kg/m  07/29/17 44.44 kg/m     Patient Care Team    Relationship Specialty Notifications Start End  Mellody Dance, DO PCP - General Family Medicine  01/21/17   Bensimhon, Shaune Pascal, MD Consulting Physician Cardiology  07/08/14   Magrinat, Virgie Dad, MD Consulting Physician Oncology  07/08/14   Deboraha Sprang, MD Consulting Physician Cardiology  07/08/14   Dohmeier, Asencion Partridge, MD Consulting Physician Neurology  07/08/14   Chesley Mires, MD Consulting Physician Pulmonary Disease  07/08/14   Fanny Skates, MD Consulting Physician General Surgery  12/28/16   Eppie Gibson, MD Attending Physician Radiation Oncology  12/28/16   Latanya Maudlin, MD Consulting Physician Orthopedic Surgery  02/07/17   Domingo Pulse, MD Consulting Physician Urology  02/07/17    Comment: Interstitial cystitis  Ena Dawley, MD Consulting Physician Obstetrics and Gynecology  03/22/17      Patient Active Problem List   Diagnosis Date Noted  . Malignant neoplasm of upper-inner quadrant of left breast in female, estrogen receptor positive ([place]) 12/22/2016    Priority: High  . Vitamin D deficiency 08/19/2011    Priority: High  . Diabetes mellitus, type II ([place]) 03/24/2011    Priority: High  . Hypertension associated with diabetes ([place]) 04/29/2008    Priority: High  . Convulsions/seizures ([place]) 12/25/2014    Priority: Medium  . Acute on chronic systolic CHF (congestive heart failure) ([place]) 11/27/2014    Priority: Medium  . Physical deconditioning 06/24/2014    Priority: Medium  . Fibromyalgia affecting multiple sites 05/29/2014    Priority: Medium  . Systolic heart failure ([place]) 08/25/2008    Priority: Medium  . Obesity, Class III,  BMI 40-49.9 (morbid obesity) ([place]) 04/29/2008    Priority: Medium  . Family history of ovarian cancer     Priority: Low  . Family history of colon cancer     Priority: Low  . Generalized anxiety disorder 07/08/2014    Priority: Low  . GERD (gastroesophageal reflux disease) 12/09/2010    Priority: Low  . Hyperlipidemia associated with type 2 diabetes mellitus ([place]) 03/22/2017  . Adjustment disorder with mixed anxiety and depressed mood 03/22/2017  . Left breast mass- path c/w CA- Dr Dalbert Batman 02/07/2017  . Fibromyalgia syndrome 02/07/2017  . S/P placement of cardiac pacemaker 02/07/2017  . Genetic testing 02/03/2017  . Malignant neoplasm of overlapping sites of right breast in female, estrogen receptor positive ([place]) 12/28/2016  . History of malignant neoplasm of breast 09/01/2015  . Conversion disorder with abnormal movement 12/25/2014  . Myoclonus 12/20/2014  . ICD in place (MDT 2013) 11/27/2014  . Near syncope 11/26/2014  . Stomach discomfort 10/14/2014  . Chronic pain not due to malignancy 05/29/2014  . Dyspnea 01/07/2014  .  Chronic sinusitis 01/07/2014  . Conversion disorder 09/03/2013  . Neuropathy due to drug ([place]) 04/27/2013  . 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 Uncontrolled persistent asthma 11/15/2012  . Orthostatic hypotension 05/18/2012  . Keloid 03/14/2012  . Single implantable cardioverter-defibrillator in situ 11/03/2011  . Weakness 07/15/2011  . Chest pain 12/09/2010  . Secondary cardiomyopathy s/p chemo therapy 04/01/2009  . FATIGUE / MALAISE 10/02/2008  . DIFFICULTY IN [facility] 04/29/2008  . DIZZINESS 04/29/2008  . NUMBNESS 04/29/2008  . EDEMA 04/29/2008  . ABNORMAL WEIGHT GAIN 04/29/2008  . PALPITATIONS 04/29/2008  . ORTHOPNEA 04/29/2008  . DYSPNEA 04/29/2008  . COUGH 04/29/2008    Past Medical history, Surgical history, Family history, Social history, Allergies and Medications have been entered into the medical  record, reviewed and changed as needed.    Current Meds  Medication Sig  . acetaminophen (TYLENOL) 500 MG tablet Take 1,000 mg by mouth 2 (two) times daily as needed for moderate pain.  Marland Kitchen albuterol (PROVENTIL HFA;VENTOLIN HFA) 108 (90 Base) MCG/ACT inhaler Inhale 1-2 puffs into the lungs every 6 (six) hours as needed for wheezing or shortness of breath.   . allopurinol (ZYLOPRIM) 300 MG tablet Take 300 mg by mouth daily.  . baclofen (LIORESAL) 10 MG tablet TAKE 1 TABLET BY MOUTH 2 (TWO) TIMES DAILY AS NEEDED FOR MUSCLE SPASMS. (Patient taking differently: TAKE 1 TABLET BY MOUTH 3 TIMES DAILY AS NEEDED FOR MUSCLE SPASMS.)  . BLACK CURRANT SEED OIL PO Take 15 mLs by mouth daily.   . Blood Glucose Monitoring Suppl (FREESTYLE FREEDOM LITE) w/Device KIT Check fasting and 2 hour PP  . carvedilol (COREG) 25 MG tablet Take 0.5 tablets (12.5 mg total) by mouth 2 (two) times daily with a meal.  . Cholecalciferol (VITAMIN D3) 5000 UNITS CAPS Take 5,000 Units by mouth daily.   . Coenzyme Q10 (CO Q-10) 100 MG CAPS Take 100 mg by mouth daily.  . Cyanocobalamin 2500 MCG SUBL Place 2,500 mcg under the tongue daily.  Marland Kitchen dicyclomine (BENTYL) 20 MG tablet Take 1 tablet (20 mg total) by mouth every 6 (six) hours.  . digoxin (LANOXIN) 0.125 MG tablet TAKE 0.5 TABLETS (0.0625 MG TOTAL) BY MOUTH DAILY.  . divalproex (DEPAKOTE ER) 500 MG 24 hr tablet Take 1.5 tablets (731m) twice a day  . DULoxetine (CYMBALTA) 30 MG capsule TAKE 1 CAPSULE BY MOUTH TWICE A DAY  . ferrous sulfate 325 (65 FE) MG tablet Take 325 mg by mouth daily with breakfast.  . FREESTYLE UNISTICK II LANCETS MISC Check fasting blood sugar in the morning and check sugar after largest meal of the day.  . gabapentin (NEURONTIN) 100 MG capsule Take 2 capsules by mouth 3 (three) times daily as needed.   .Marland KitchenGLUCOSAMINE-CHONDROITIN PO Take 1,200 mg by mouth daily.  .Marland Kitchenglucose blood (FREESTYLE TEST STRIPS) test strip To be filled with Freedom Lite test strips.    Check fasting blood sugar in the morning and check sugar after largest meal of the day.  . hyaluronate sodium (RADIAPLEXRX) GEL Apply 1 application topically once.  . hydrOXYzine (ATARAX/VISTARIL) 25 MG tablet Take 25 mg by mouth at bedtime as needed for itching (DOESN'T TAKE WITH TRAZODONE).  .Marland Kitchenletrozole (FEMARA) 2.5 MG tablet Take 1 tablet (2.5 mg total) by mouth daily.  .Marland Kitchenloratadine (CLARITIN) 10 MG tablet Take 10 mg by mouth daily.  .Marland Kitchenlosartan (COZAAR) 25 MG tablet Take 1 tablet (25 mg total) by mouth daily.  . Menthol, Topical  Analgesic, (ICY HOT EX) Apply 1 application topically daily as needed (muscle pain).  Marland Kitchen metolazone (ZAROXOLYN) 2.5 MG tablet Take 1 tablet (2.5 mg total) by mouth daily as needed (edema).  . Milk Thistle 150 MG CAPS Take 450 mg by mouth daily.   . Multiple Vitamin (MULTIVITAMIN WITH MINERALS) TABS Take 1 tablet by mouth daily. Reported on 01/13/9561  . non-metallic deodorant Jethro Poling) MISC Apply 1 application topically daily as needed.  Marland Kitchen omeprazole (PRILOSEC) 40 MG capsule TAKE 1 CAPSULE (40 MG TOTAL) BY MOUTH 2 (TWO) TIMES DAILY BEFORE A MEAL. BREAKFAST AND SUPPER  . ondansetron (ZOFRAN) 4 MG tablet Take 1 tablet (4 mg total) by mouth every 8 (eight) hours as needed for nausea or vomiting.  Marland Kitchen PATADAY 0.2 % SOLN Place 1 drop into both eyes daily as needed (allergies).   . pentosan polysulfate (ELMIRON) 100 MG capsule Take 100 mg by mouth 2 (two) times daily as needed (bladder spasms).   . potassium chloride SA (K-DUR,KLOR-CON) 20 MEQ tablet Take 40 mEq by mouth 2 (two) times daily.  . RESTASIS 0.05 % ophthalmic emulsion   . Sodium Chloride-Sodium Bicarb (NETI POT SINUS WASH NA) Place 1 Dose into the nose daily as needed (congestion).  Marland Kitchen spironolactone (ALDACTONE) 25 MG tablet Take 1 tablet (25 mg total) daily by mouth.  . torsemide (DEMADEX) 20 MG tablet TAKE 2 TABLETS (40 MG TOTAL) BY MOUTH DAILY.  . traZODone (DESYREL) 50 MG tablet Take 50 mg by mouth at bedtime as  needed for sleep.   Marland Kitchen triamcinolone cream (KENALOG) 0.1 % Apply 1 application topically 2 (two) times daily as needed (eczema).  . venlafaxine XR (EFFEXOR-XR) 37.5 MG 24 hr capsule Take 1 capsule (37.5 mg total) by mouth daily with breakfast.  . Vitamin D, Ergocalciferol, (DRISDOL) 50000 units CAPS capsule Take 1 capsule (50,000 Units total) by mouth every 7 (seven) days.    Allergies:  Allergies  Allergen Reactions  . Ace Inhibitors Other (See Comments)    hyperkalemia  . Reglan [Metoclopramide Hcl] Anxiety  . Adhesive [Tape] Itching and Rash    Specifically tegaderm, normal adhesive tape is ok     Review of Systems: General:   Denies fever, chills, unexplained weight loss.  Optho/Auditory:   Denies visual changes, blurred vision/LOV Respiratory:   Denies wheeze, DOE more than baseline levels.  Cardiovascular:   Denies chest pain, palpitations, new onset peripheral edema  Gastrointestinal:   Denies nausea, vomiting, diarrhea, abd pain.  Genitourinary: Denies dysuria, freq/ urgency, flank pain or discharge from genitals.  Endocrine:     Denies hot or cold intolerance, polyuria, polydipsia. Musculoskeletal:   Denies unexplained myalgias, joint swelling, unexplained arthralgias, gait problems.  Skin:  Denies new onset rash, suspicious lesions Neurological:     Denies dizziness, unexplained weakness, numbness  Psychiatric/Behavioral:   Denies mood changes, suicidal or homicidal ideations, hallucinations    Objective:   Blood pressure 110/60, pulse (!) 102, height 5' 7" (1.702 m), weight 273 lb (123.8 kg), SpO2 96 %. Body mass index is 42.76 kg/m. General:  Well Developed, well nourished, appropriate for stated age.  Neuro:  Alert and oriented,  extra-ocular muscles intact  HEENT:  Normocephalic, atraumatic, neck supple Skin:  no gross rash, warm, pink. Cardiac:  RRR, S1 S2 Respiratory:  ECTA B/L and A/P, Not using accessory muscles, speaking in full sentences-  unlabored. Vascular:  Ext warm, no cyanosis apprec.; cap RF less 2 sec. Psych:  No HI/SI, judgement and insight good, Euthymic mood.  Full Affect. Right Ankle: Grossly tender over the entire ankle, especially over lateral malleoli region.  Mild swelling in lateral aspect as well.   significantly TTP distal fibula anteriorly and posteriorly and approx 2 inches up shaft.  ROM okay, but decreased due to pain.  NVI distally.

## 2017-08-12 NOTE — Patient Instructions (Addendum)
Return around 10/28/2017 for BP, DM, CHOL etc and chronic f/up. So please keep this appt.

## 2017-08-19 ENCOUNTER — Ambulatory Visit (INDEPENDENT_AMBULATORY_CARE_PROVIDER_SITE_OTHER): Payer: Medicare Other | Admitting: Orthopaedic Surgery

## 2017-08-23 ENCOUNTER — Other Ambulatory Visit: Payer: Self-pay | Admitting: Internal Medicine

## 2017-08-24 ENCOUNTER — Telehealth (INDEPENDENT_AMBULATORY_CARE_PROVIDER_SITE_OTHER): Payer: Self-pay | Admitting: Orthopaedic Surgery

## 2017-08-24 NOTE — Telephone Encounter (Signed)
Patient's mother Hassan Rowan) called asked for a call back concerning her daughter  not wanting to have the surgery. Hassan Rowan asked if [name]'s leg will heal. The number to contact Hassan Rowan is [phone]

## 2017-08-25 NOTE — Telephone Encounter (Signed)
I called times 2 , no answer. Mom can always come along when pt has ROV. Not sure if I have permission from pt to discuss her condition with her Mom.

## 2017-08-25 NOTE — Telephone Encounter (Signed)
Please advise 

## 2017-08-29 NOTE — Telephone Encounter (Signed)
I tried to reach patient. She did not fill out release of information form for our facility. We do not have authorization to speak to her mother.  Unable to leave message. Will wait for return call to advise.

## 2017-08-29 NOTE — Telephone Encounter (Signed)
Patient called back stating that she wants to try the cast first before doing the surgery.  She wants to know what the next step will be.  CB#[phone].  Thank you.

## 2017-08-29 NOTE — Telephone Encounter (Signed)
Please advise 

## 2017-08-29 NOTE — Telephone Encounter (Signed)
Ucall, suppose to come back in to check cast and  Xray position ,can come in later this week. Thank you

## 2017-08-31 NOTE — Telephone Encounter (Signed)
I left voicemail for patient advising. Asked her to please return call to office to schedule appointment for Friday or next week.

## 2017-09-07 ENCOUNTER — Ambulatory Visit (INDEPENDENT_AMBULATORY_CARE_PROVIDER_SITE_OTHER): Payer: Medicare Other | Admitting: *Deleted

## 2017-09-07 DIAGNOSIS — I428 Other cardiomyopathies: Secondary | ICD-10-CM | POA: Diagnosis not present

## 2017-09-07 NOTE — Progress Notes (Signed)
Remote ICD transmission.   

## 2017-09-09 LAB — CUP PACEART REMOTE DEVICE CHECK
Battery Remaining Longevity: 136 mo
Battery Voltage: 3.1 V
HIGH POWER IMPEDANCE MEASURED VALUE: 44 Ohm
Implantable Lead Implant Date: 20130522
Implantable Lead Model: 181
Implantable Pulse Generator Implant Date: 20180910
Lead Channel Pacing Threshold Amplitude: 1.125 V
Lead Channel Pacing Threshold Pulse Width: 0.4 ms
Lead Channel Sensing Intrinsic Amplitude: 13 mV
Lead Channel Setting Pacing Amplitude: 2.5 V
Lead Channel Setting Pacing Pulse Width: 0.4 ms
Lead Channel Setting Sensing Sensitivity: 0.3 mV
MDC IDC LEAD LOCATION: 753860
MDC IDC LEAD SERIAL: 318396
MDC IDC MSMT LEADCHNL RV IMPEDANCE VALUE: 361 Ohm
MDC IDC MSMT LEADCHNL RV IMPEDANCE VALUE: 361 Ohm
MDC IDC MSMT LEADCHNL RV SENSING INTR AMPL: 13 mV
MDC IDC SESS DTM: 20190326230116
MDC IDC STAT BRADY RV PERCENT PACED: 0 %

## 2017-09-12 ENCOUNTER — Encounter: Payer: Self-pay | Admitting: Cardiology

## 2017-09-13 ENCOUNTER — Encounter (HOSPITAL_COMMUNITY): Payer: Self-pay | Admitting: *Deleted

## 2017-09-13 ENCOUNTER — Ambulatory Visit: Payer: Self-pay | Admitting: Internal Medicine

## 2017-09-13 ENCOUNTER — Other Ambulatory Visit: Payer: Self-pay

## 2017-09-13 ENCOUNTER — Inpatient Hospital Stay (HOSPITAL_COMMUNITY)
Admission: EM | Admit: 2017-09-13 | Discharge: 2017-10-12 | DRG: 291 | Disposition: E | Payer: Medicare Other | Attending: Cardiovascular Disease | Admitting: Cardiovascular Disease

## 2017-09-13 DIAGNOSIS — R945 Abnormal results of liver function studies: Secondary | ICD-10-CM

## 2017-09-13 DIAGNOSIS — Z7189 Other specified counseling: Secondary | ICD-10-CM

## 2017-09-13 DIAGNOSIS — K760 Fatty (change of) liver, not elsewhere classified: Secondary | ICD-10-CM | POA: Diagnosis present

## 2017-09-13 DIAGNOSIS — R140 Abdominal distension (gaseous): Secondary | ICD-10-CM | POA: Diagnosis not present

## 2017-09-13 DIAGNOSIS — R17 Unspecified jaundice: Secondary | ICD-10-CM | POA: Diagnosis present

## 2017-09-13 DIAGNOSIS — Z79899 Other long term (current) drug therapy: Secondary | ICD-10-CM

## 2017-09-13 DIAGNOSIS — Z853 Personal history of malignant neoplasm of breast: Secondary | ICD-10-CM

## 2017-09-13 DIAGNOSIS — R160 Hepatomegaly, not elsewhere classified: Secondary | ICD-10-CM

## 2017-09-13 DIAGNOSIS — E119 Type 2 diabetes mellitus without complications: Secondary | ICD-10-CM

## 2017-09-13 DIAGNOSIS — Z6841 Body Mass Index (BMI) 40.0 and over, adult: Secondary | ICD-10-CM | POA: Diagnosis not present

## 2017-09-13 DIAGNOSIS — E871 Hypo-osmolality and hyponatremia: Secondary | ICD-10-CM | POA: Diagnosis not present

## 2017-09-13 DIAGNOSIS — R7989 Other specified abnormal findings of blood chemistry: Secondary | ICD-10-CM

## 2017-09-13 DIAGNOSIS — T451X5A Adverse effect of antineoplastic and immunosuppressive drugs, initial encounter: Secondary | ICD-10-CM | POA: Diagnosis present

## 2017-09-13 DIAGNOSIS — Z452 Encounter for adjustment and management of vascular access device: Secondary | ICD-10-CM

## 2017-09-13 DIAGNOSIS — Z923 Personal history of irradiation: Secondary | ICD-10-CM

## 2017-09-13 DIAGNOSIS — I5023 Acute on chronic systolic (congestive) heart failure: Secondary | ICD-10-CM | POA: Diagnosis present

## 2017-09-13 DIAGNOSIS — E875 Hyperkalemia: Secondary | ICD-10-CM | POA: Diagnosis not present

## 2017-09-13 DIAGNOSIS — Z888 Allergy status to other drugs, medicaments and biological substances status: Secondary | ICD-10-CM

## 2017-09-13 DIAGNOSIS — I5021 Acute systolic (congestive) heart failure: Secondary | ICD-10-CM | POA: Diagnosis present

## 2017-09-13 DIAGNOSIS — I5022 Chronic systolic (congestive) heart failure: Secondary | ICD-10-CM | POA: Diagnosis present

## 2017-09-13 DIAGNOSIS — E1122 Type 2 diabetes mellitus with diabetic chronic kidney disease: Secondary | ICD-10-CM | POA: Diagnosis present

## 2017-09-13 DIAGNOSIS — Z95810 Presence of automatic (implantable) cardiac defibrillator: Secondary | ICD-10-CM

## 2017-09-13 DIAGNOSIS — Z8249 Family history of ischemic heart disease and other diseases of the circulatory system: Secondary | ICD-10-CM

## 2017-09-13 DIAGNOSIS — N179 Acute kidney failure, unspecified: Secondary | ICD-10-CM | POA: Diagnosis not present

## 2017-09-13 DIAGNOSIS — I959 Hypotension, unspecified: Secondary | ICD-10-CM | POA: Diagnosis present

## 2017-09-13 DIAGNOSIS — E785 Hyperlipidemia, unspecified: Secondary | ICD-10-CM | POA: Diagnosis present

## 2017-09-13 DIAGNOSIS — R531 Weakness: Secondary | ICD-10-CM | POA: Diagnosis not present

## 2017-09-13 DIAGNOSIS — I427 Cardiomyopathy due to drug and external agent: Secondary | ICD-10-CM | POA: Diagnosis present

## 2017-09-13 DIAGNOSIS — F329 Major depressive disorder, single episode, unspecified: Secondary | ICD-10-CM | POA: Diagnosis present

## 2017-09-13 DIAGNOSIS — I110 Hypertensive heart disease with heart failure: Secondary | ICD-10-CM | POA: Diagnosis present

## 2017-09-13 DIAGNOSIS — J9601 Acute respiratory failure with hypoxia: Secondary | ICD-10-CM | POA: Diagnosis not present

## 2017-09-13 DIAGNOSIS — Z515 Encounter for palliative care: Secondary | ICD-10-CM

## 2017-09-13 DIAGNOSIS — R296 Repeated falls: Secondary | ICD-10-CM | POA: Diagnosis present

## 2017-09-13 DIAGNOSIS — K7200 Acute and subacute hepatic failure without coma: Secondary | ICD-10-CM | POA: Diagnosis not present

## 2017-09-13 DIAGNOSIS — R570 Cardiogenic shock: Secondary | ICD-10-CM | POA: Diagnosis not present

## 2017-09-13 DIAGNOSIS — M797 Fibromyalgia: Secondary | ICD-10-CM | POA: Diagnosis present

## 2017-09-13 DIAGNOSIS — Z66 Do not resuscitate: Secondary | ICD-10-CM | POA: Diagnosis present

## 2017-09-13 DIAGNOSIS — Z91048 Other nonmedicinal substance allergy status: Secondary | ICD-10-CM

## 2017-09-13 DIAGNOSIS — I4892 Unspecified atrial flutter: Secondary | ICD-10-CM | POA: Diagnosis present

## 2017-09-13 DIAGNOSIS — R0602 Shortness of breath: Secondary | ICD-10-CM

## 2017-09-13 DIAGNOSIS — I509 Heart failure, unspecified: Secondary | ICD-10-CM

## 2017-09-13 DIAGNOSIS — I5082 Biventricular heart failure: Secondary | ICD-10-CM | POA: Diagnosis present

## 2017-09-13 DIAGNOSIS — F449 Dissociative and conversion disorder, unspecified: Secondary | ICD-10-CM | POA: Diagnosis present

## 2017-09-13 DIAGNOSIS — K219 Gastro-esophageal reflux disease without esophagitis: Secondary | ICD-10-CM | POA: Diagnosis present

## 2017-09-13 DIAGNOSIS — F419 Anxiety disorder, unspecified: Secondary | ICD-10-CM | POA: Diagnosis present

## 2017-09-13 LAB — CBC WITH DIFFERENTIAL/PLATELET
BASOS ABS: 0 10*3/uL (ref 0.0–0.1)
BASOS PCT: 0 %
Eosinophils Absolute: 0 10*3/uL (ref 0.0–0.7)
Eosinophils Relative: 0 %
HEMATOCRIT: 42.6 % (ref 36.0–46.0)
HEMOGLOBIN: 14.7 g/dL (ref 12.0–15.0)
LYMPHS PCT: 18 %
Lymphs Abs: 0.7 10*3/uL (ref 0.7–4.0)
MCH: 34.5 pg — ABNORMAL HIGH (ref 26.0–34.0)
MCHC: 34.5 g/dL (ref 30.0–36.0)
MCV: 100 fL (ref 78.0–100.0)
MONO ABS: 0.8 10*3/uL (ref 0.1–1.0)
MONOS PCT: 19 %
NEUTROS ABS: 2.6 10*3/uL (ref 1.7–7.7)
NEUTROS PCT: 63 %
Platelets: 139 10*3/uL — ABNORMAL LOW (ref 150–400)
RBC: 4.26 MIL/uL (ref 3.87–5.11)
RDW: 21 % — ABNORMAL HIGH (ref 11.5–15.5)
WBC: 4.1 10*3/uL (ref 4.0–10.5)

## 2017-09-13 LAB — CBG MONITORING, ED: Glucose-Capillary: 84 mg/dL (ref 65–99)

## 2017-09-13 MED ORDER — SODIUM CHLORIDE 0.9 % IV BOLUS
500.0000 mL | Freq: Once | INTRAVENOUS | Status: AC
Start: 2017-09-14 — End: 2017-09-14
  Administered 2017-09-14: 500 mL via INTRAVENOUS

## 2017-09-13 NOTE — ED Provider Notes (Signed)
[facility] DEPT Provider Note   CSN: [id number] Arrival date & time: 09/17/2017  2009     History   Chief Complaint Chief Complaint  Patient presents with  . Weakness    HPI [name] is a 47 y.o. female.  Patient is a 47 year old female with extensive past medical history including fibromyalgia, CHF, conversion disorder, diabetes.  He presents today for evaluation of weakness, shakiness, and dropping things.  She is also become more somnolent.  The symptoms have progressed over the past month.  She denies any medication changes.  She denies any other recent illnesses.  The history is provided by the patient.  Weakness  Primary symptoms comment: Generalized weakness. This is a new problem. Episode onset: 1 month ago. The problem has been gradually worsening. There has been no fever. Associated symptoms include altered mental status and confusion. Pertinent negatives include no shortness of breath and no vomiting.    Past Medical History:  Diagnosis Date  . Acne   . AICD (automatic cardioverter/defibrillator) present   . Anemia 1980's   [id number]  . Anginal pain ([place])    sporatic , been going on for yrs,  . Anxiety   . Arthritis   . 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 Blood transfusion 1980's   1987 or 1988  . Breast calcifications    right breast  . Breast cancer (HCC)    right;  . 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 Cardiac MRI 05/2011 EF 32%. ICD implantation 10/2011 ([employer])  . 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 Conversion disorder   . 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 Depression   . Endometriosis   . Family history of colon cancer   . Family history of ovarian cancer   . Fibromyalgia   . GERD (gastroesophageal reflux disease)   . Headache   . Hepatomegaly   . History of radiation therapy 05/09/17-  06/08/2017   Left Breast treated to 40.05 Gy in 15 fractions of 2.67 Gy, followed by a 10 gy boost in 5 fractions to yield a total dose of 50.05 Gy to the Left Breast  . History of stomach ulcers   . Hyperlipidemia   . Hypertension    c/b orthostatic hypotention  . ICD (implantable cardiac defibrillator) in place   . Insomnia   . Interstitial cystitis   . Left eye injury 12/2014  . 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   . Nonischemic cardiomyopathy ([place])    related to chemo; EF 30% 05/2011  . Obesity   . Palpitation    normal sinus rhythm only on 21 day heart monitor  . Panic attacks   . Peripheral neuropathy    chemo- induced  . Pneumonia 2000's   "once"  . Seasonal allergies   . Seizures ([place])    last seizure  last week: non elipsey seisure  . Type II diabetes mellitus [facility])     Patient Active Problem List   Diagnosis Date Noted  . Hyperlipidemia associated with type 2 diabetes mellitus ([place]) 03/22/2017  . Adjustment disorder with mixed anxiety and depressed mood 03/22/2017  . Left breast mass- path c/w CA- Dr Dalbert Batman 02/07/2017  . Fibromyalgia syndrome 02/07/2017  . S/P placement of cardiac pacemaker 02/07/2017  . Genetic testing 02/03/2017  . Family history of ovarian cancer   . Family  history of colon cancer   . Malignant neoplasm of overlapping sites of right breast in female, estrogen receptor positive ([place]) 12/28/2016  . Malignant neoplasm of upper-inner quadrant of left breast in female, estrogen receptor positive ([place]) 12/22/2016  . History of malignant neoplasm of breast 09/01/2015  . Conversion disorder with abnormal movement 12/25/2014  . Convulsions/seizures ([place]) 12/25/2014  . Myoclonus 12/20/2014  . Acute on chronic systolic CHF (congestive heart failure) ([place]) 11/27/2014  . ICD in place (MDT 2013) 11/27/2014  . Near syncope 11/26/2014  . Stomach discomfort 10/14/2014  . Generalized  anxiety disorder 07/08/2014  . Physical deconditioning 06/24/2014  . Fibromyalgia affecting multiple sites 05/29/2014  . Chronic pain not due to malignancy 05/29/2014  . Dyspnea 01/07/2014  . Chronic sinusitis 01/07/2014  . Conversion disorder 09/03/2013  . 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 Uncontrolled persistent asthma 11/15/2012  . Orthostatic hypotension 05/18/2012  . Keloid 03/14/2012  . Single implantable cardioverter-defibrillator in situ 11/03/2011  . Vitamin D deficiency 08/19/2011  . Weakness 07/15/2011  . Diabetes mellitus, type II ([place]) 03/24/2011  . Chest pain 12/09/2010  . GERD (gastroesophageal reflux disease) 12/09/2010  . Secondary cardiomyopathy s/p chemo therapy 04/01/2009  . FATIGUE / MALAISE 10/02/2008  . Systolic heart failure ([place]) 08/25/2008  . Obesity, Class III, BMI 40-49.9 (morbid obesity) ([place]) 04/29/2008  . Hypertension associated with diabetes ([place]) 04/29/2008  . DIFFICULTY IN [facility] 04/29/2008  . DIZZINESS 04/29/2008  . NUMBNESS 04/29/2008  . EDEMA 04/29/2008  . ABNORMAL WEIGHT GAIN 04/29/2008  . PALPITATIONS 04/29/2008  . ORTHOPNEA 04/29/2008  . DYSPNEA 04/29/2008  . COUGH 04/29/2008    Past Surgical History:  Procedure Laterality Date  . ABDOMINAL HYSTERECTOMY     partial  . BREAST EXCISIONAL BIOPSY     right benign 2012  . BREAST LUMPECTOMY  2008; 2013   right radiation  . BREAST LUMPECTOMY WITH RADIOACTIVE SEED AND SENTINEL LYMPH NODE BIOPSY Left 02/21/2017   Procedure: LEFT BREAST LUMPECTOMY WITH RADIOACTIVE SEED AND LEFT AXILLARY DEEP SENTINEL LYMPH NODE BIOPSY;  Surgeon: Fanny Skates, MD;  Location: [facility];  Service: General;  Laterality: Left;  . CARDIAC DEFIBRILLATOR PLACEMENT  11/03/11  . CESAREAN SECTION    . CHOLECYSTECTOMY  ~ 2009  . COLONOSCOPY    . ICD REVISION N/A 02/21/2017   Procedure: ICD Revision;  Surgeon: Deboraha Sprang, MD;   Location: [facility];  Service: Cardiovascular;  Laterality: N/A;  . IMPLANTABLE CARDIOVERTER DEFIBRILLATOR IMPLANT N/A 11/03/2011   Procedure: IMPLANTABLE CARDIOVERTER DEFIBRILLATOR IMPLANT;  Surgeon: Deboraha Sprang, MD; MDT   . LAPAROSCOPIC ENDOMETRIOSIS FULGURATION  1990's  . NASAL SINUS SURGERY    . [place] REMOVAL  2010?   left chest; placed in 2008  . TONSILLECTOMY  1980's  . TUBAL LIGATION  1990's     OB History   None      Home Medications    Prior to Admission medications   Medication Sig Start Date End Date Taking? Authorizing Provider  acetaminophen (TYLENOL) 500 MG tablet Take 1,000 mg by mouth 2 (two) times daily as needed for moderate pain.   Yes [provider]  albuterol (PROVENTIL HFA;VENTOLIN HFA) 108 (90 Base) MCG/ACT inhaler Inhale 1-2 puffs into the lungs every 6 (six) hours as needed for wheezing or shortness of breath.    Yes [provider]  allopurinol (ZYLOPRIM) 300 MG tablet Take 300 mg by mouth daily. 03/22/17  Yes [provider]  baclofen (LIORESAL) 10 MG tablet TAKE 1 TABLET BY MOUTH 2 (TWO) TIMES DAILY AS NEEDED FOR MUSCLE SPASMS. Patient taking differently: TAKE 1 TABLET BY MOUTH 3 TIMES DAILY AS NEEDED FOR MUSCLE SPASMS. 08/14/15  Yes Calone, Ples Specter, FNP  BLACK CURRANT SEED OIL PO Take 15 mLs by mouth daily.    Yes [provider]  carvedilol (COREG) 25 MG tablet Take 0.5 tablets (12.5 mg total) by mouth 2 (two) times daily with a meal. 11/27/14  Yes Kilroy, Doreene Burke, PA-C  Cholecalciferol (VITAMIN D3) 5000 UNITS CAPS Take 5,000 Units by mouth daily.    Yes [provider]  Coenzyme Q10 (CO Q-10) 100 MG CAPS Take 100 mg by mouth daily.   Yes [provider]  Cyanocobalamin 2500 MCG SUBL Place 2,500 mcg under the tongue daily.   Yes [provider]  dicyclomine (BENTYL) 20 MG tablet Take 1 tablet (20 mg total) by mouth every 6 (six) hours. 08/04/17  Yes Gatha Mayer, MD  digoxin  (LANOXIN) 0.125 MG tablet TAKE 0.5 TABLETS (0.0625 MG TOTAL) BY MOUTH DAILY. 06/29/17  Yes Bensimhon, Shaune Pascal, MD  divalproex (DEPAKOTE ER) 500 MG 24 hr tablet Take 1.5 tablets (734m) twice a day 07/06/17  Yes Dohmeier, CAsencion Partridge MD  DULoxetine (CYMBALTA) 30 MG capsule TAKE 1 CAPSULE BY MOUTH TWICE A DAY 07/25/17  Yes Dohmeier, CAsencion Partridge MD  ferrous sulfate 325 (65 FE) MG tablet Take 325 mg by mouth daily with breakfast.   Yes [provider]  gabapentin (NEURONTIN) 100 MG capsule Take 2 capsules by mouth 3 (three) times daily as needed (pain).  07/06/17  Yes [provider]  GLUCOSAMINE-CHONDROITIN PO Take 1,200 mg by mouth daily.   Yes [provider]  hyaluronate sodium (RADIAPLEXRX) GEL Apply 1 application topically once.   Yes [provider]  hydrOXYzine (ATARAX/VISTARIL) 25 MG tablet Take 25 mg by mouth at bedtime as needed for itching (DOESN'T TAKE WITH TRAZODONE).   Yes [provider]  letrozole (FEMARA) 2.5 MG tablet Take 1 tablet (2.5 mg total) by mouth daily. 02/01/17  Yes Magrinat, GVirgie Dad MD  loratadine (CLARITIN) 10 MG tablet Take 10 mg by mouth daily.   Yes [provider]  losartan (COZAAR) 25 MG tablet Take 1 tablet (25 mg total) by mouth daily. 10/18/16  Yes Bensimhon, DShaune Pascal MD  Menthol, Topical Analgesic, (ICY HOT EX) Apply 1 application topically daily as needed (muscle pain).   Yes [provider]  metolazone (ZAROXOLYN) 2.5 MG tablet Take 1 tablet (2.5 mg total) by mouth daily as needed (edema). 07/29/17  Yes Bensimhon, DShaune Pascal MD  Milk Thistle 150 MG CAPS Take 450 mg by mouth daily.    Yes [provider]  Multiple Vitamin (MULTIVITAMIN WITH MINERALS) TABS Take 1 tablet by mouth daily. Reported on 10/13/2015   Yes [provider]  non-metallic deodorant (Jethro Poling MISC Apply 1 application topically daily as needed (deo).    Yes [provider]  omeprazole (PRILOSEC) 40 MG capsule TAKE 1 CAPSULE  (40 MG TOTAL) BY MOUTH 2 (TWO) TIMES DAILY BEFORE A MEAL. BREAKFAST AND SUPPER 03/15/17  Yes GGatha Mayer MD  ondansetron (ZOFRAN) 4 MG tablet Take 1 tablet (4 mg total) by mouth every 8 (eight) hours as needed for nausea or vomiting. 10/29/16  Yes Bensimhon, DShaune Pascal MD  Oxycodone HCl 10 MG TABS Take 10 mg by mouth every 6 (six) hours as needed (pain).  Yes [provider]  PATADAY 0.2 % SOLN Place 1 drop into both eyes daily as needed (allergies).  01/22/13  Yes [provider]  pentosan polysulfate (ELMIRON) 100 MG capsule Take 100 mg by mouth 2 (two) times daily as needed (bladder spasms).    Yes [provider]  potassium chloride SA (K-DUR,KLOR-CON) 20 MEQ tablet Take 40 mEq by mouth 2 (two) times daily.   Yes [provider]  RESTASIS 0.05 % ophthalmic emulsion Place 1 drop into both eyes 2 (two) times daily.  03/31/17  Yes [provider]  Sodium Chloride-Sodium Bicarb (NETI POT SINUS [place] NA) Place 1 Dose into the nose daily as needed (congestion).   Yes [provider]  spironolactone (ALDACTONE) 25 MG tablet Take 1 tablet (25 mg total) daily by mouth. 04/28/17  Yes Bensimhon, Shaune Pascal, MD  torsemide (DEMADEX) 20 MG tablet TAKE 2 TABLETS (40 MG TOTAL) BY MOUTH DAILY. 11/02/16  Yes Larey Dresser, MD  traZODone (DESYREL) 50 MG tablet Take 50 mg by mouth at bedtime as needed for sleep.  10/29/14  Yes [provider]  triamcinolone cream (KENALOG) 0.1 % Apply 1 application topically 2 (two) times daily as needed (eczema).   Yes [provider]  venlafaxine XR (EFFEXOR-XR) 37.5 MG 24 hr capsule Take 1 capsule (37.5 mg total) by mouth daily with breakfast. 04/05/17  Yes Magrinat, Virgie Dad, MD  Vitamin D, Ergocalciferol, (DRISDOL) 50000 units CAPS capsule Take 1 capsule (50,000 Units total) by mouth every 7 (seven) days. 03/14/17  Yes Opalski, Deborah, DO  Blood Glucose Monitoring Suppl (FREESTYLE FREEDOM LITE) w/Device KIT  Check fasting and 2 hour PP 03/22/17   Opalski, Deborah, DO  FREESTYLE UNISTICK II LANCETS MISC Check fasting blood sugar in the morning and check sugar after largest meal of the day. 03/22/17   Opalski, Deborah, DO  glucose blood (FREESTYLE TEST STRIPS) test strip To be filled with Freedom Lite test strips.   Check fasting blood sugar in the morning and check sugar after largest meal of the day. 03/22/17   Mellody Dance, DO    Family History Family History  Problem Relation Age of Onset  . Heart disease Mother   . Arthritis Mother   . Hypertension Mother   . Stroke Mother   . Fibromyalgia Mother   . Coronary artery disease Unknown   . Heart attack Unknown   . Heart disease Maternal Uncle   . Colon cancer Paternal Uncle        dx over 37  . Heart disease Maternal Grandmother   . Heart attack Maternal Grandmother   . Ovarian cancer Paternal Grandmother   . Fibromyalgia Sister   . Ovarian cancer Paternal Aunt     Social History Social History   Tobacco Use  . Smoking status: Never Smoker  . Smokeless tobacco: Never Used  Substance Use Topics  . Alcohol use: Yes    Comment: social drinker   . Drug use: No     Allergies   Ace inhibitors; Reglan [metoclopramide hcl]; and Adhesive [tape]   Review of Systems Review of Systems  Respiratory: Negative for shortness of breath.   Gastrointestinal: Negative for vomiting.  Neurological: Positive for weakness.  Psychiatric/Behavioral: Positive for confusion.  All other systems reviewed and are negative.    Physical Exam Updated Vital Signs BP (!) 92/56   Pulse 87   Temp 98.2 F (36.8 C) (Oral)   Resp (!) 25   SpO2 96%  Physical Exam  Constitutional: She is oriented to person, place, and time. She appears well-developed and well-nourished. No distress.  HENT:  Head: Normocephalic and atraumatic.  Neck: Normal range of motion. Neck supple.  Cardiovascular: Normal rate and regular rhythm. Exam reveals no gallop and no  friction rub.  No murmur heard. Pulmonary/Chest: Effort normal and breath sounds normal. No respiratory distress. She has no wheezes.  Abdominal: Soft. Bowel sounds are normal. She exhibits no distension. There is no tenderness.  Musculoskeletal: Normal range of motion.  Neurological: She is alert and oriented to person, place, and time. No cranial nerve deficit. She exhibits normal muscle tone. Coordination normal.  Patient speech is somewhat slowed.  She does repeat herself frequently and appears intermittently confused.  Skin: Skin is warm and dry. She is not diaphoretic.  Nursing note and vitals reviewed.    ED Treatments / Results  Labs (all labs ordered are listed, but only abnormal results are displayed) Labs Reviewed  URINALYSIS, ROUTINE W REFLEX MICROSCOPIC  CBC WITH DIFFERENTIAL/PLATELET  ETHANOL  RAPID URINE DRUG SCREEN, HOSP PERFORMED  HCG, SERUM, QUALITATIVE  HEPATIC FUNCTION PANEL  BASIC METABOLIC PANEL  AMMONIA  DIGOXIN LEVEL  CBG MONITORING, ED    EKG None  Radiology No results found.  Procedures Procedures (including critical care time)  Medications Ordered in ED Medications  sodium chloride 0.9 % bolus 500 mL (has no administration in time range)     Initial Impression / Assessment and Plan / ED Course  I have reviewed the triage vital signs and the nursing notes.  Pertinent labs & imaging results that were available during my care of the patient were reviewed by me and considered in my medical decision making (see chart for details).  Patient presenting with a one month history of increased fatigue, weakness, difficulty holding onto objects, and episodes of sleepiness.  He has extensive past medical history as diagnosed in the HPI.  Today's workup reveals elevated liver functions with total bilirubin greater than 11.  She also has acute renal failure with a creatinine of 3.  The etiology of this liver and renal failure is unclear at this time.  She  will require admission for further workup.  I have discussed the case with Dr. Alcario Drought who agrees to admit.  Final Clinical Impressions(s) / ED Diagnoses   Final diagnoses:  None    ED Discharge Orders    None       Veryl Speak, MD 09/14/17 [phone]

## 2017-09-13 NOTE — ED Triage Notes (Signed)
Pt reports generalized weakness for months - worsening.  Reports intermittent confusion, falling asleep, and balance problem which caused her to fall.  Cast noted on her RLE.  Pt's family reports last time she fell was last week Thursday.  Pt is falling asleep in triage but pt states she does not feel tired.  She had stopped taking her meds x 2 days to see if it from her meds.  Pt is A&Ox 4.

## 2017-09-14 ENCOUNTER — Inpatient Hospital Stay (HOSPITAL_COMMUNITY): Payer: Medicare Other

## 2017-09-14 ENCOUNTER — Emergency Department (HOSPITAL_COMMUNITY): Payer: Medicare Other

## 2017-09-14 ENCOUNTER — Encounter (HOSPITAL_COMMUNITY): Payer: Self-pay

## 2017-09-14 ENCOUNTER — Other Ambulatory Visit: Payer: Self-pay

## 2017-09-14 DIAGNOSIS — R160 Hepatomegaly, not elsewhere classified: Secondary | ICD-10-CM | POA: Insufficient documentation

## 2017-09-14 DIAGNOSIS — R918 Other nonspecific abnormal finding of lung field: Secondary | ICD-10-CM | POA: Diagnosis not present

## 2017-09-14 DIAGNOSIS — I5021 Acute systolic (congestive) heart failure: Secondary | ICD-10-CM | POA: Diagnosis present

## 2017-09-14 DIAGNOSIS — F419 Anxiety disorder, unspecified: Secondary | ICD-10-CM | POA: Diagnosis present

## 2017-09-14 DIAGNOSIS — R570 Cardiogenic shock: Secondary | ICD-10-CM | POA: Insufficient documentation

## 2017-09-14 DIAGNOSIS — M797 Fibromyalgia: Secondary | ICD-10-CM | POA: Diagnosis present

## 2017-09-14 DIAGNOSIS — R7989 Other specified abnormal findings of blood chemistry: Secondary | ICD-10-CM | POA: Diagnosis not present

## 2017-09-14 DIAGNOSIS — G9341 Metabolic encephalopathy: Secondary | ICD-10-CM | POA: Insufficient documentation

## 2017-09-14 DIAGNOSIS — E785 Hyperlipidemia, unspecified: Secondary | ICD-10-CM | POA: Diagnosis present

## 2017-09-14 DIAGNOSIS — I4892 Unspecified atrial flutter: Secondary | ICD-10-CM | POA: Insufficient documentation

## 2017-09-14 DIAGNOSIS — K7200 Acute and subacute hepatic failure without coma: Secondary | ICD-10-CM

## 2017-09-14 DIAGNOSIS — I340 Nonrheumatic mitral (valve) insufficiency: Secondary | ICD-10-CM

## 2017-09-14 DIAGNOSIS — J810 Acute pulmonary edema: Secondary | ICD-10-CM | POA: Diagnosis not present

## 2017-09-14 DIAGNOSIS — R17 Unspecified jaundice: Secondary | ICD-10-CM | POA: Diagnosis present

## 2017-09-14 DIAGNOSIS — R945 Abnormal results of liver function studies: Secondary | ICD-10-CM

## 2017-09-14 DIAGNOSIS — Z6841 Body Mass Index (BMI) 40.0 and over, adult: Secondary | ICD-10-CM | POA: Diagnosis not present

## 2017-09-14 DIAGNOSIS — E875 Hyperkalemia: Secondary | ICD-10-CM | POA: Diagnosis not present

## 2017-09-14 DIAGNOSIS — K760 Fatty (change of) liver, not elsewhere classified: Secondary | ICD-10-CM | POA: Diagnosis present

## 2017-09-14 DIAGNOSIS — E871 Hypo-osmolality and hyponatremia: Secondary | ICD-10-CM | POA: Diagnosis not present

## 2017-09-14 DIAGNOSIS — J9601 Acute respiratory failure with hypoxia: Secondary | ICD-10-CM | POA: Diagnosis not present

## 2017-09-14 DIAGNOSIS — I5082 Biventricular heart failure: Secondary | ICD-10-CM | POA: Diagnosis present

## 2017-09-14 DIAGNOSIS — E1149 Type 2 diabetes mellitus with other diabetic neurological complication: Secondary | ICD-10-CM | POA: Diagnosis not present

## 2017-09-14 DIAGNOSIS — F449 Dissociative and conversion disorder, unspecified: Secondary | ICD-10-CM | POA: Diagnosis not present

## 2017-09-14 DIAGNOSIS — Z515 Encounter for palliative care: Secondary | ICD-10-CM | POA: Diagnosis not present

## 2017-09-14 DIAGNOSIS — R0602 Shortness of breath: Secondary | ICD-10-CM | POA: Diagnosis not present

## 2017-09-14 DIAGNOSIS — F329 Major depressive disorder, single episode, unspecified: Secondary | ICD-10-CM | POA: Diagnosis present

## 2017-09-14 DIAGNOSIS — E1122 Type 2 diabetes mellitus with diabetic chronic kidney disease: Secondary | ICD-10-CM | POA: Diagnosis present

## 2017-09-14 DIAGNOSIS — N179 Acute kidney failure, unspecified: Secondary | ICD-10-CM | POA: Diagnosis not present

## 2017-09-14 DIAGNOSIS — I5022 Chronic systolic (congestive) heart failure: Secondary | ICD-10-CM

## 2017-09-14 DIAGNOSIS — K219 Gastro-esophageal reflux disease without esophagitis: Secondary | ICD-10-CM | POA: Diagnosis present

## 2017-09-14 DIAGNOSIS — I509 Heart failure, unspecified: Secondary | ICD-10-CM | POA: Diagnosis not present

## 2017-09-14 DIAGNOSIS — Z452 Encounter for adjustment and management of vascular access device: Secondary | ICD-10-CM | POA: Diagnosis not present

## 2017-09-14 DIAGNOSIS — R140 Abdominal distension (gaseous): Secondary | ICD-10-CM | POA: Diagnosis not present

## 2017-09-14 DIAGNOSIS — I351 Nonrheumatic aortic (valve) insufficiency: Secondary | ICD-10-CM | POA: Diagnosis not present

## 2017-09-14 DIAGNOSIS — I5023 Acute on chronic systolic (congestive) heart failure: Secondary | ICD-10-CM | POA: Diagnosis present

## 2017-09-14 DIAGNOSIS — I959 Hypotension, unspecified: Secondary | ICD-10-CM | POA: Diagnosis not present

## 2017-09-14 DIAGNOSIS — T451X5A Adverse effect of antineoplastic and immunosuppressive drugs, initial encounter: Secondary | ICD-10-CM | POA: Diagnosis present

## 2017-09-14 DIAGNOSIS — Z7189 Other specified counseling: Secondary | ICD-10-CM | POA: Diagnosis not present

## 2017-09-14 DIAGNOSIS — J45901 Unspecified asthma with (acute) exacerbation: Secondary | ICD-10-CM | POA: Diagnosis not present

## 2017-09-14 DIAGNOSIS — Z66 Do not resuscitate: Secondary | ICD-10-CM | POA: Diagnosis present

## 2017-09-14 DIAGNOSIS — R296 Repeated falls: Secondary | ICD-10-CM | POA: Diagnosis present

## 2017-09-14 DIAGNOSIS — I110 Hypertensive heart disease with heart failure: Secondary | ICD-10-CM | POA: Diagnosis present

## 2017-09-14 DIAGNOSIS — Z95810 Presence of automatic (implantable) cardiac defibrillator: Secondary | ICD-10-CM

## 2017-09-14 LAB — COMPREHENSIVE METABOLIC PANEL
ALT: 31 U/L (ref 14–54)
AST: 49 U/L — ABNORMAL HIGH (ref 15–41)
Albumin: 3 g/dL — ABNORMAL LOW (ref 3.5–5.0)
Alkaline Phosphatase: 129 U/L — ABNORMAL HIGH (ref 38–126)
Anion gap: 17 — ABNORMAL HIGH (ref 5–15)
BUN: 60 mg/dL — ABNORMAL HIGH (ref 6–20)
CO2: 28 mmol/L (ref 22–32)
Calcium: 9.5 mg/dL (ref 8.9–10.3)
Chloride: 90 mmol/L — ABNORMAL LOW (ref 101–111)
Creatinine, Ser: 2.96 mg/dL — ABNORMAL HIGH (ref 0.44–1.00)
GFR, EST AFRICAN AMERICAN: 21 mL/min — AB (ref >60)
GFR, EST NON AFRICAN AMERICAN: 18 mL/min — AB (ref >60)
Glucose, Bld: 91 mg/dL (ref 65–99)
POTASSIUM: 3.3 mmol/L — AB (ref 3.5–5.1)
Sodium: 135 mmol/L (ref 135–145)
Total Bilirubin: 9.9 mg/dL — ABNORMAL HIGH (ref 0.3–1.2)
Total Protein: 7.3 g/dL (ref 6.5–8.1)

## 2017-09-14 LAB — BLOOD GAS, ARTERIAL
ACID-BASE EXCESS: 3 mmol/L — AB (ref 0.0–2.0)
BICARBONATE: 24.3 mmol/L (ref 20.0–28.0)
Drawn by: 225631
FIO2: 21
O2 SAT: 98.2 %
PATIENT TEMPERATURE: 98.6
pCO2 arterial: 28.2 mmHg — ABNORMAL LOW (ref 32.0–48.0)
pH, Arterial: 7.544 — ABNORMAL HIGH (ref 7.350–7.450)
pO2, Arterial: 105 mmHg (ref 83.0–108.0)

## 2017-09-14 LAB — HEPATIC FUNCTION PANEL
ALK PHOS: 135 U/L — AB (ref 38–126)
ALT: 18 U/L (ref 14–54)
AST: 122 U/L — ABNORMAL HIGH (ref 15–41)
Albumin: 3 g/dL — ABNORMAL LOW (ref 3.5–5.0)
BILIRUBIN INDIRECT: 6.7 mg/dL — AB (ref 0.3–0.9)
Bilirubin, Direct: 4.8 mg/dL — ABNORMAL HIGH (ref 0.1–0.5)
TOTAL PROTEIN: 6.8 g/dL (ref 6.5–8.1)
Total Bilirubin: 11.5 mg/dL — ABNORMAL HIGH (ref 0.3–1.2)

## 2017-09-14 LAB — RAPID URINE DRUG SCREEN, HOSP PERFORMED
Amphetamines: NOT DETECTED
BENZODIAZEPINES: NOT DETECTED
Barbiturates: NOT DETECTED
Cocaine: NOT DETECTED
Opiates: NOT DETECTED
Tetrahydrocannabinol: NOT DETECTED

## 2017-09-14 LAB — BRAIN NATRIURETIC PEPTIDE: B NATRIURETIC PEPTIDE 5: 542.4 pg/mL — AB (ref 0.0–100.0)

## 2017-09-14 LAB — BASIC METABOLIC PANEL
Anion gap: 14 (ref 5–15)
BUN: 61 mg/dL — AB (ref 6–20)
CHLORIDE: 87 mmol/L — AB (ref 101–111)
CO2: 29 mmol/L (ref 22–32)
Calcium: 9.3 mg/dL (ref 8.9–10.3)
Creatinine, Ser: 3.01 mg/dL — ABNORMAL HIGH (ref 0.44–1.00)
GFR calc Af Amer: 20 mL/min — ABNORMAL LOW (ref >60)
GFR, EST NON AFRICAN AMERICAN: 17 mL/min — AB (ref >60)
GLUCOSE: 74 mg/dL (ref 65–99)
Sodium: 130 mmol/L — ABNORMAL LOW (ref 135–145)

## 2017-09-14 LAB — COOXEMETRY PANEL
CARBOXYHEMOGLOBIN: 1.9 % — AB (ref 0.5–1.5)
Methemoglobin: 1.3 % (ref 0.0–1.5)
O2 SAT: 56.5 %
Total hemoglobin: 13.6 g/dL (ref 12.0–16.0)

## 2017-09-14 LAB — DIGOXIN LEVEL: Digoxin Level: <0.2 ng/mL — ABNORMAL LOW (ref 0.8–2.0)

## 2017-09-14 LAB — POTASSIUM: POTASSIUM: 3.1 mmol/L — AB (ref 3.5–5.1)

## 2017-09-14 LAB — ACETAMINOPHEN LEVEL: Acetaminophen (Tylenol), Serum: <10 ug/mL — ABNORMAL LOW (ref 10–30)

## 2017-09-14 LAB — LACTIC ACID, PLASMA: LACTIC ACID, VENOUS: 1.8 mmol/L (ref 0.5–1.9)

## 2017-09-14 LAB — ECHOCARDIOGRAM COMPLETE: WEIGHTICAEL: 4323 [oz_av]

## 2017-09-14 LAB — SALICYLATE LEVEL

## 2017-09-14 LAB — MRSA PCR SCREENING: MRSA BY PCR: NEGATIVE

## 2017-09-14 LAB — URINALYSIS, ROUTINE W REFLEX MICROSCOPIC
Glucose, UA: NEGATIVE mg/dL
Hgb urine dipstick: NEGATIVE
KETONES UR: NEGATIVE mg/dL
Leukocytes, UA: NEGATIVE
NITRITE: NEGATIVE
PROTEIN: NEGATIVE mg/dL
Specific Gravity, Urine: 1.013 (ref 1.005–1.030)
pH: 5 (ref 5.0–8.0)

## 2017-09-14 LAB — I-STAT BETA HCG BLOOD, ED (MC, WL, AP ONLY): I-stat hCG, quantitative: 15 m[IU]/mL — ABNORMAL HIGH (ref <5)

## 2017-09-14 LAB — I-STAT CG4 LACTIC ACID, ED: Lactic Acid, Venous: 1.91 mmol/L — ABNORMAL HIGH (ref 0.5–1.9)

## 2017-09-14 LAB — HIV ANTIBODY (ROUTINE TESTING W REFLEX): HIV Screen 4th Generation wRfx: NONREACTIVE

## 2017-09-14 LAB — PROTIME-INR
INR: 1.14
Prothrombin Time: 14.5 seconds (ref 11.4–15.2)

## 2017-09-14 LAB — ETHANOL

## 2017-09-14 LAB — HEPARIN LEVEL (UNFRACTIONATED): Heparin Unfractionated: <0.1 IU/mL — ABNORMAL LOW (ref 0.30–0.70)

## 2017-09-14 LAB — AMMONIA: Ammonia: 39 umol/L — ABNORMAL HIGH (ref 9–35)

## 2017-09-14 MED ORDER — NOREPINEPHRINE BITARTRATE 1 MG/ML IV SOLN
0.0000 ug/min | INTRAVENOUS | Status: DC
  Administered 2017-09-15: 2 ug/min via INTRAVENOUS
  Administered 2017-09-15: 6 ug/min via INTRAVENOUS
  Filled 2017-09-14 (×2): qty 4

## 2017-09-14 MED ORDER — SODIUM CHLORIDE 0.9 % IV SOLN: INTRAVENOUS | Status: DC | PRN

## 2017-09-14 MED ORDER — OLOPATADINE HCL 0.1 % OP SOLN
1.0000 [drp] | Freq: Two times a day (BID) | OPHTHALMIC | Status: DC
  Administered 2017-09-14 – 2017-09-17 (×6): 1 [drp] via OPHTHALMIC
  Filled 2017-09-14 (×2): qty 5

## 2017-09-14 MED ORDER — FENTANYL CITRATE (PF) 100 MCG/2ML IJ SOLN
INTRAMUSCULAR | Status: AC
  Filled 2017-09-14: qty 2

## 2017-09-14 MED ORDER — ONDANSETRON HCL 4 MG/2ML IJ SOLN: 4.0000 mg | Freq: Four times a day (QID) | INTRAMUSCULAR | Status: DC | PRN

## 2017-09-14 MED ORDER — ACETAMINOPHEN 650 MG RE SUPP: 650.0000 mg | Freq: Four times a day (QID) | RECTAL | Status: DC | PRN

## 2017-09-14 MED ORDER — PANTOPRAZOLE SODIUM 40 MG PO TBEC
80.0000 mg | DELAYED_RELEASE_TABLET | Freq: Two times a day (BID) | ORAL | Status: DC
  Administered 2017-09-14 – 2017-09-16 (×5): 80 mg via ORAL
  Filled 2017-09-14 (×6): qty 2

## 2017-09-14 MED ORDER — DIGOXIN 0.0625 MG HALF TABLET: 0.0625 mg | ORAL_TABLET | Freq: Every day | ORAL | Status: DC

## 2017-09-14 MED ORDER — MIDAZOLAM HCL 2 MG/2ML IJ SOLN
INTRAMUSCULAR | Status: AC
  Filled 2017-09-14: qty 2

## 2017-09-14 MED ORDER — CARVEDILOL 12.5 MG PO TABS: 12.5000 mg | ORAL_TABLET | Freq: Two times a day (BID) | ORAL | Status: DC

## 2017-09-14 MED ORDER — ALBUTEROL SULFATE (2.5 MG/3ML) 0.083% IN NEBU
2.5000 mg | INHALATION_SOLUTION | Freq: Four times a day (QID) | RESPIRATORY_TRACT | Status: DC | PRN
  Administered 2017-09-16: 2.5 mg via RESPIRATORY_TRACT
  Filled 2017-09-14: qty 3

## 2017-09-14 MED ORDER — FERROUS SULFATE 325 (65 FE) MG PO TABS
325.0000 mg | ORAL_TABLET | Freq: Every day | ORAL | Status: DC
  Administered 2017-09-14 – 2017-09-16 (×3): 325 mg via ORAL
  Filled 2017-09-14 (×3): qty 1

## 2017-09-14 MED ORDER — ONDANSETRON HCL 4 MG PO TABS: 4.0000 mg | ORAL_TABLET | Freq: Four times a day (QID) | ORAL | Status: DC | PRN

## 2017-09-14 MED ORDER — MILRINONE LACTATE IN DEXTROSE 20-5 MG/100ML-% IV SOLN
0.1250 ug/kg/min | INTRAVENOUS | Status: DC
  Administered 2017-09-14 – 2017-09-17 (×5): 0.125 ug/kg/min via INTRAVENOUS
  Filled 2017-09-14 (×4): qty 100

## 2017-09-14 MED ORDER — FUROSEMIDE 10 MG/ML IJ SOLN
120.0000 mg | Freq: Two times a day (BID) | INTRAVENOUS | Status: DC
  Administered 2017-09-14 – 2017-09-15 (×3): 120 mg via INTRAVENOUS
  Filled 2017-09-14: qty 10
  Filled 2017-09-14: qty 12
  Filled 2017-09-14: qty 10
  Filled 2017-09-14: qty 12
  Filled 2017-09-14: qty 10
  Filled 2017-09-14: qty 12

## 2017-09-14 MED ORDER — LORATADINE 10 MG PO TABS
10.0000 mg | ORAL_TABLET | Freq: Every day | ORAL | Status: DC
  Administered 2017-09-14 – 2017-09-16 (×3): 10 mg via ORAL
  Filled 2017-09-14 (×3): qty 1

## 2017-09-14 MED ORDER — AMIODARONE HCL IN DEXTROSE 360-4.14 MG/200ML-% IV SOLN
30.0000 mg/h | INTRAVENOUS | Status: DC
  Administered 2017-09-15 – 2017-09-18 (×6): 30 mg/h via INTRAVENOUS
  Filled 2017-09-14 (×6): qty 200

## 2017-09-14 MED ORDER — HEPARIN BOLUS VIA INFUSION
3000.0000 [IU] | Freq: Once | INTRAVENOUS | Status: AC
  Administered 2017-09-14: 3000 [IU] via INTRAVENOUS
  Filled 2017-09-14: qty 3000

## 2017-09-14 MED ORDER — LETROZOLE 2.5 MG PO TABS
2.5000 mg | ORAL_TABLET | Freq: Every day | ORAL | Status: DC
  Administered 2017-09-14 – 2017-09-16 (×3): 2.5 mg via ORAL
  Filled 2017-09-14 (×4): qty 1

## 2017-09-14 MED ORDER — CYCLOSPORINE 0.05 % OP EMUL
1.0000 [drp] | Freq: Two times a day (BID) | OPHTHALMIC | Status: DC
  Administered 2017-09-14 – 2017-09-17 (×7): 1 [drp] via OPHTHALMIC
  Filled 2017-09-14 (×9): qty 1

## 2017-09-14 MED ORDER — GABAPENTIN 100 MG PO CAPS: 200.0000 mg | ORAL_CAPSULE | Freq: Three times a day (TID) | ORAL | Status: DC | PRN

## 2017-09-14 MED ORDER — DIVALPROEX SODIUM ER 500 MG PO TB24
750.0000 mg | ORAL_TABLET | Freq: Two times a day (BID) | ORAL | Status: DC
Start: 2017-09-14 — End: 2017-09-17
  Administered 2017-09-14 – 2017-09-16 (×5): 750 mg via ORAL
  Filled 2017-09-14 (×7): qty 1

## 2017-09-14 MED ORDER — ALLOPURINOL 300 MG PO TABS
300.0000 mg | ORAL_TABLET | Freq: Every day | ORAL | Status: DC
  Administered 2017-09-14 – 2017-09-16 (×3): 300 mg via ORAL
  Filled 2017-09-14 (×3): qty 1

## 2017-09-14 MED ORDER — HEPARIN BOLUS VIA INFUSION
4000.0000 [IU] | Freq: Once | INTRAVENOUS | Status: AC
  Administered 2017-09-14: 4000 [IU] via INTRAVENOUS
  Filled 2017-09-14: qty 4000

## 2017-09-14 MED ORDER — DICYCLOMINE HCL 20 MG PO TABS
20.0000 mg | ORAL_TABLET | Freq: Four times a day (QID) | ORAL | Status: DC
  Administered 2017-09-14 – 2017-09-15 (×8): 20 mg via ORAL
  Filled 2017-09-14 (×13): qty 1

## 2017-09-14 MED ORDER — OXYCODONE HCL 5 MG PO TABS
10.0000 mg | ORAL_TABLET | Freq: Four times a day (QID) | ORAL | Status: DC | PRN
  Administered 2017-09-15: 10 mg via ORAL
  Filled 2017-09-14: qty 2

## 2017-09-14 MED ORDER — PERFLUTREN LIPID MICROSPHERE
1.0000 mL | INTRAVENOUS | Status: AC | PRN
  Administered 2017-09-14: 2.5 mL via INTRAVENOUS
  Filled 2017-09-14: qty 10

## 2017-09-14 MED ORDER — VENLAFAXINE HCL ER 37.5 MG PO CP24
37.5000 mg | ORAL_CAPSULE | Freq: Every day | ORAL | Status: DC
  Administered 2017-09-14 – 2017-09-16 (×3): 37.5 mg via ORAL
  Filled 2017-09-14 (×4): qty 1

## 2017-09-14 MED ORDER — HEPARIN (PORCINE) IN NACL 100-0.45 UNIT/ML-% IJ SOLN
1350.0000 [IU]/h | INTRAMUSCULAR | Status: DC
  Administered 2017-09-14 (×2): 1200 [IU]/h via INTRAVENOUS
  Administered 2017-09-15: 1450 [IU]/h via INTRAVENOUS
  Administered 2017-09-16 – 2017-09-17 (×2): 1350 [IU]/h via INTRAVENOUS
  Filled 2017-09-14 (×5): qty 250

## 2017-09-14 MED ORDER — DULOXETINE HCL 30 MG PO CPEP
30.0000 mg | ORAL_CAPSULE | Freq: Two times a day (BID) | ORAL | Status: DC
  Administered 2017-09-14 – 2017-09-16 (×5): 30 mg via ORAL
  Filled 2017-09-14 (×7): qty 1

## 2017-09-14 MED ORDER — POTASSIUM CHLORIDE CRYS ER 20 MEQ PO TBCR
40.0000 meq | EXTENDED_RELEASE_TABLET | Freq: Two times a day (BID) | ORAL | Status: DC
  Administered 2017-09-14 – 2017-09-16 (×4): 40 meq via ORAL
  Filled 2017-09-14 (×6): qty 2

## 2017-09-14 MED ORDER — FENTANYL CITRATE (PF) 100 MCG/2ML IJ SOLN
25.0000 ug | Freq: Once | INTRAMUSCULAR | Status: AC
  Administered 2017-09-14: 25 ug via INTRAVENOUS

## 2017-09-14 MED ORDER — AMIODARONE HCL IN DEXTROSE 360-4.14 MG/200ML-% IV SOLN
60.0000 mg/h | INTRAVENOUS | Status: AC
  Administered 2017-09-14 (×2): 60 mg/h via INTRAVENOUS
  Filled 2017-09-14 (×2): qty 200

## 2017-09-14 MED ORDER — MIDAZOLAM HCL 2 MG/2ML IJ SOLN
1.0000 mg | Freq: Once | INTRAMUSCULAR | Status: AC
  Administered 2017-09-14: 1 mg via INTRAVENOUS

## 2017-09-14 MED ORDER — ADULT MULTIVITAMIN W/MINERALS CH
1.0000 | ORAL_TABLET | Freq: Every day | ORAL | Status: DC
  Administered 2017-09-14 – 2017-09-16 (×3): 1 via ORAL
  Filled 2017-09-14 (×3): qty 1

## 2017-09-14 MED ORDER — IOPAMIDOL (ISOVUE-300) INJECTION 61%
30.0000 mL | Freq: Once | INTRAVENOUS | Status: AC | PRN
  Administered 2017-09-14: 30 mL via ORAL

## 2017-09-14 MED ORDER — ACETAMINOPHEN 325 MG PO TABS
650.0000 mg | ORAL_TABLET | Freq: Four times a day (QID) | ORAL | Status: DC | PRN
  Administered 2017-09-14: 650 mg via ORAL
  Filled 2017-09-14: qty 2

## 2017-09-14 MED ORDER — IOPAMIDOL (ISOVUE-300) INJECTION 61%
INTRAVENOUS | Status: AC
  Filled 2017-09-14: qty 30

## 2017-09-14 MED ORDER — PERFLUTREN LIPID MICROSPHERE
INTRAVENOUS | Status: AC
  Filled 2017-09-14: qty 10

## 2017-09-14 MED ORDER — AMIODARONE LOAD VIA INFUSION
150.0000 mg | Freq: Once | INTRAVENOUS | Status: AC
  Administered 2017-09-14: 150 mg via INTRAVENOUS
  Filled 2017-09-14: qty 83.34

## 2017-09-14 MED ORDER — ALBUTEROL SULFATE HFA 108 (90 BASE) MCG/ACT IN AERS: 1.0000 | INHALATION_SPRAY | Freq: Four times a day (QID) | RESPIRATORY_TRACT | Status: DC | PRN

## 2017-09-14 MED ORDER — TRAZODONE HCL 50 MG PO TABS: 50.0000 mg | ORAL_TABLET | Freq: Every evening | ORAL | Status: DC | PRN

## 2017-09-14 NOTE — Consult Note (Addendum)
Cardiology Consultation:   Patient ID: [name]; [id number]; [birth year]   Admit date: 09/24/2017 Date of Consult: 09/14/2017  Primary Care Provider: Mellody Dance, DO Primary Cardiologist: Glori Bickers, MD  Primary Electrophysiologist:  NA   Patient Profile:   [name] is a 47 y.o. female with a hx of NICM with CHF, EF 20-25%--related to her chemo for breast cancer.  Last chemo 2008, morbid obesity, depression, fibromyalgia, and conversion disorder who is being seen today for the evaluation of acute systolic HF with hypotension at the request of Dr. Erlinda Hong.  History of Present Illness:   [name] has a hx of NICM with CHF, EF 20-25%--related to her chemo for breast cancer.  Last chemo 2008, morbid obesity, depression, fibromyalgia, and conversion disorder confirmed by [facility].  Has MDT ICD.   Past hx "She presented to [facility] on [facility]/26/14 with near syncope and dyspnea.With + orthostatics in ER (SBP 104 to 87 from lying to sitting) She also had volume overload with acute on chronic CHF. BNP 1093.0. Troponins normal. Had 7 lb  gain over 2 days with dietary indiscretion of sodium.Admitted for IV diuretic therapy and monitoring of BP. Coreg reduced 12.5 mg BID. Symptoms improved and good diuresis with IV lasix therapy. I/Os net negative 6 L total. Was transitioned back to PO Torsemide once euvolemic. Discharge weight was 249 lb.  Evaluated by pulmonary and Dr Elsworth Soho told he saw no evidence of asthma.    Recently diagnosed with recurrent Breast CA. Underwent Lumpectomy + with clean margins. Completed XRT at end of 12/18"  On last visit 07/29/17 she was SOB, no edema and ICD interrogation done "No VT/VF. Optivol up since completing XRT. (suspect related to inflammation) Activity level < 1 hour"  Her torsemide was increased to BID for 3 days.    BP on that visit was 97/68 P 94 and wt 283.    09/06/17 pt had ICD interrogation with 6-7 beats of NSVT, opitvol abnormal since Dec.     Pt presented to ER yesterday with generalized weakness for months, and increasing, some confusion, seems to fall asleep but she states she hears everything going on - she held her meds for 2 days.  She has fallen and has displaced fracture of lateral malleolus of Rt fibula.    Wt here identical to march 1st.  AKI.  K+ 3.1  (first was hemolyzed and >7.5) Cr 3.01 (previously 1.05 to 0.93) Na 130 in Feb 135,  AST 122 on arrival now 31.  ammonia 39. Direct bilirubin 4.8, Indirect bilirubin 6.7 t. Bilirubin 11.5  BNP 542  EKG with a flutter new from 02/2017  CT of abd/pelvis  IMPRESSION: Severe cardiomegaly and a pack steatosis. No acute abdominal or pelvic abnormality.  Currently SOB with lying flat, sitting up can talk better and feels better.  No chest pain.  Bartholome Bill had severe abd pain recently and seen by Dr. Carlean Purl and high likelihood of IBS --Gut edema from CHF may be playing a role.  (colonoscopy for 10/24/17).  BP here down to 73/60 currently 93 systolic.  Blood gasses 7.54/ pCO2 28.2/ p)2 105 / HC03 24.3 on RA.  Past Medical History:  Diagnosis Date  . Acne   . AICD (automatic cardioverter/defibrillator) present   . Anemia 1980's   [id number]  . Anginal pain ([place])    sporatic , been going on for yrs,  . Anxiety   . Arthritis   . 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 Blood transfusion 1980's   1987  or 1988  . Breast calcifications    right breast  . Breast cancer (HCC)    right;  . CHF (congestive heart failure) ([place])    due to non-ischemic cardiomyopathy, thought to be chemotherapy induced;  cath 7/12: normal cors, EF 20-25%. Cardiac MRI 05/2011 EF 32%. ICD implantation 10/2011 ([employer])  . 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 Conversion disorder   . 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 Depression   . Endometriosis   . Family history of colon cancer   . Family history of ovarian cancer   . Fibromyalgia   . GERD (gastroesophageal reflux disease)   . Headache   .  Hepatomegaly   . History of radiation therapy 05/09/17- 06/08/2017   Left Breast treated to 40.05 Gy in 15 fractions of 2.67 Gy, followed by a 10 gy boost in 5 fractions to yield a total dose of 50.05 Gy to the Left Breast  . History of stomach ulcers   . Hyperlipidemia   . Hypertension    c/b orthostatic hypotention  . ICD (implantable cardiac defibrillator) in place   . Insomnia   . Interstitial cystitis   . Left eye injury 12/2014  . 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   . Nonischemic cardiomyopathy ([place])    related to chemo; EF 30% 05/2011  . Obesity   . Palpitation    normal sinus rhythm only on 21 day heart monitor  . Panic attacks   . Peripheral neuropathy    chemo- induced  . Pneumonia 2000's   "once"  . Seasonal allergies   . Seizures ([place])    last seizure  last week: non elipsey seisure  . Type II diabetes mellitus ([place])     Past Surgical History:  Procedure Laterality Date  . ABDOMINAL HYSTERECTOMY     partial  . BREAST EXCISIONAL BIOPSY     right benign 2012  . BREAST LUMPECTOMY  2008; 2013   right radiation  . BREAST LUMPECTOMY WITH RADIOACTIVE SEED AND SENTINEL LYMPH NODE BIOPSY Left 02/21/2017   Procedure: LEFT BREAST LUMPECTOMY WITH RADIOACTIVE SEED AND LEFT AXILLARY DEEP SENTINEL LYMPH NODE BIOPSY;  Surgeon: Fanny Skates, MD;  Location: [facility];  Service: General;  Laterality: Left;  . CARDIAC DEFIBRILLATOR PLACEMENT  11/03/11  . CESAREAN SECTION    . CHOLECYSTECTOMY  ~ 2009  . COLONOSCOPY    . ICD REVISION N/A 02/21/2017   Procedure: ICD Revision;  Surgeon: Deboraha Sprang, MD;  Location: [facility];  Service: Cardiovascular;  Laterality: N/A;  . IMPLANTABLE CARDIOVERTER DEFIBRILLATOR IMPLANT N/A 11/03/2011   Procedure: IMPLANTABLE CARDIOVERTER DEFIBRILLATOR IMPLANT;  Surgeon: Deboraha Sprang, MD; MDT   . LAPAROSCOPIC ENDOMETRIOSIS FULGURATION  1990's  . NASAL SINUS SURGERY      . [place] REMOVAL  2010?   left chest; placed in 2008  . TONSILLECTOMY  1980's  . TUBAL LIGATION  1990's     Home Medications:  Prior to Admission medications   Medication Sig Start Date End Date Taking? Authorizing Provider  acetaminophen (TYLENOL) 500 MG tablet Take 1,000 mg by mouth 2 (two) times daily as needed for moderate pain.   Yes [provider]  albuterol (PROVENTIL HFA;VENTOLIN HFA) 108 (90 Base) MCG/ACT inhaler Inhale 1-2 puffs into the lungs every 6 (six) hours as needed for wheezing or shortness of breath.    Yes [provider]  allopurinol (ZYLOPRIM) 300 MG tablet Take 300 mg  by mouth daily. 03/22/17  Yes [provider]  baclofen (LIORESAL) 10 MG tablet TAKE 1 TABLET BY MOUTH 2 (TWO) TIMES DAILY AS NEEDED FOR MUSCLE SPASMS. Patient taking differently: TAKE 1 TABLET BY MOUTH 3 TIMES DAILY AS NEEDED FOR MUSCLE SPASMS. 08/14/15  Yes Calone, Ples Specter, FNP  BLACK CURRANT SEED OIL PO Take 15 mLs by mouth daily.    Yes [provider]  carvedilol (COREG) 25 MG tablet Take 0.5 tablets (12.5 mg total) by mouth 2 (two) times daily with a meal. 11/27/14  Yes Kilroy, Doreene Burke, PA-C  Cholecalciferol (VITAMIN D3) 5000 UNITS CAPS Take 5,000 Units by mouth daily.    Yes [provider]  Coenzyme Q10 (CO Q-10) 100 MG CAPS Take 100 mg by mouth daily.   Yes [provider]  Cyanocobalamin 2500 MCG SUBL Place 2,500 mcg under the tongue daily.   Yes [provider]  dicyclomine (BENTYL) 20 MG tablet Take 1 tablet (20 mg total) by mouth every 6 (six) hours. 08/04/17  Yes Gatha Mayer, MD  digoxin (LANOXIN) 0.125 MG tablet TAKE 0.5 TABLETS (0.0625 MG TOTAL) BY MOUTH DAILY. 06/29/17  Yes Bensimhon, Shaune Pascal, MD  divalproex (DEPAKOTE ER) 500 MG 24 hr tablet Take 1.5 tablets ('750mg'$ ) twice a day 07/06/17  Yes Dohmeier, Asencion Partridge, MD  DULoxetine (CYMBALTA) 30 MG capsule TAKE 1 CAPSULE BY MOUTH TWICE A DAY 07/25/17  Yes Dohmeier, Asencion Partridge, MD   ferrous sulfate 325 (65 FE) MG tablet Take 325 mg by mouth daily with breakfast.   Yes [provider]  gabapentin (NEURONTIN) 100 MG capsule Take 2 capsules by mouth 3 (three) times daily as needed (pain).  07/06/17  Yes [provider]  GLUCOSAMINE-CHONDROITIN PO Take 1,200 mg by mouth daily.   Yes [provider]  hyaluronate sodium (RADIAPLEXRX) GEL Apply 1 application topically once.   Yes [provider]  hydrOXYzine (ATARAX/VISTARIL) 25 MG tablet Take 25 mg by mouth at bedtime as needed for itching (DOESN'T TAKE WITH TRAZODONE).   Yes [provider]  letrozole (FEMARA) 2.5 MG tablet Take 1 tablet (2.5 mg total) by mouth daily. 02/01/17  Yes Magrinat, Virgie Dad, MD  loratadine (CLARITIN) 10 MG tablet Take 10 mg by mouth daily.   Yes [provider]  losartan (COZAAR) 25 MG tablet Take 1 tablet (25 mg total) by mouth daily. 10/18/16  Yes Bensimhon, Shaune Pascal, MD  Menthol, Topical Analgesic, (ICY HOT EX) Apply 1 application topically daily as needed (muscle pain).   Yes [provider]  metolazone (ZAROXOLYN) 2.5 MG tablet Take 1 tablet (2.5 mg total) by mouth daily as needed (edema). 07/29/17  Yes Bensimhon, Shaune Pascal, MD  Milk Thistle 150 MG CAPS Take 450 mg by mouth daily.    Yes [provider]  Multiple Vitamin (MULTIVITAMIN WITH MINERALS) TABS Take 1 tablet by mouth daily. Reported on 10/13/2015   Yes [provider]  non-metallic deodorant Jethro Poling) MISC Apply 1 application topically daily as needed (deo).    Yes [provider]  omeprazole (PRILOSEC) 40 MG capsule TAKE 1 CAPSULE (40 MG TOTAL) BY MOUTH 2 (TWO) TIMES DAILY BEFORE A MEAL. BREAKFAST AND SUPPER 03/15/17  Yes Gatha Mayer, MD  ondansetron (ZOFRAN) 4 MG tablet Take 1 tablet (4 mg total) by mouth every 8 (eight) hours as needed for nausea or vomiting. 10/29/16  Yes Bensimhon, Shaune Pascal, MD  Oxycodone HCl 10 MG TABS Take 10 mg by mouth every 6 (six)  hours  as needed (pain).    Yes [provider]  PATADAY 0.2 % SOLN Place 1 drop into both eyes daily as needed (allergies).  01/22/13  Yes [provider]  pentosan polysulfate (ELMIRON) 100 MG capsule Take 100 mg by mouth 2 (two) times daily as needed (bladder spasms).    Yes [provider]  potassium chloride SA (K-DUR,KLOR-CON) 20 MEQ tablet Take 40 mEq by mouth 2 (two) times daily.   Yes [provider]  RESTASIS 0.05 % ophthalmic emulsion Place 1 drop into both eyes 2 (two) times daily.  03/31/17  Yes [provider]  Sodium Chloride-Sodium Bicarb (NETI POT SINUS Ali Chuk NA) Place 1 Dose into the nose daily as needed (congestion).   Yes [provider]  spironolactone (ALDACTONE) 25 MG tablet Take 1 tablet (25 mg total) daily by mouth. 04/28/17  Yes Bensimhon, Shaune Pascal, MD  torsemide (DEMADEX) 20 MG tablet TAKE 2 TABLETS (40 MG TOTAL) BY MOUTH DAILY. 11/02/16  Yes Larey Dresser, MD  traZODone (DESYREL) 50 MG tablet Take 50 mg by mouth at bedtime as needed for sleep.  10/29/14  Yes [provider]  triamcinolone cream (KENALOG) 0.1 % Apply 1 application topically 2 (two) times daily as needed (eczema).   Yes [provider]  venlafaxine XR (EFFEXOR-XR) 37.5 MG 24 hr capsule Take 1 capsule (37.5 mg total) by mouth daily with breakfast. 04/05/17  Yes Magrinat, Virgie Dad, MD  Vitamin D, Ergocalciferol, (DRISDOL) 50000 units CAPS capsule Take 1 capsule (50,000 Units total) by mouth every 7 (seven) days. 03/14/17  Yes Opalski, Deborah, DO  Blood Glucose Monitoring Suppl (FREESTYLE FREEDOM LITE) w/Device KIT Check fasting and 2 hour PP 03/22/17   Opalski, Deborah, DO  FREESTYLE UNISTICK II LANCETS MISC Check fasting blood sugar in the morning and check sugar after largest meal of the day. 03/22/17   Opalski, Deborah, DO  glucose blood (FREESTYLE TEST STRIPS) test strip To be filled with Freedom Lite test strips.   Check fasting blood sugar  in the morning and check sugar after largest meal of the day. 03/22/17   Mellody Dance, DO    Inpatient Medications: Scheduled Meds: . allopurinol  300 mg Oral Daily  . cycloSPORINE  1 drop Both Eyes BID  . dicyclomine  20 mg Oral Q6H  . divalproex  750 mg Oral BID  . DULoxetine  30 mg Oral BID  . ferrous sulfate  325 mg Oral Q breakfast  . heparin  4,000 Units Intravenous Once  . letrozole  2.5 mg Oral Daily  . loratadine  10 mg Oral Daily  . multivitamin with minerals  1 tablet Oral Daily  . olopatadine  1 drop Both Eyes BID  . pantoprazole  80 mg Oral BID  . venlafaxine XR  37.5 mg Oral Q breakfast   Continuous Infusions: . heparin     PRN Meds: acetaminophen **OR** acetaminophen, albuterol, gabapentin, ondansetron **OR** ondansetron (ZOFRAN) IV, oxyCODONE, perflutren lipid microspheres (DEFINITY) IV suspension, traZODone  Allergies:    Allergies  Allergen Reactions  . Ace Inhibitors Other (See Comments)    hyperkalemia  . Reglan [Metoclopramide Hcl] Anxiety  . Adhesive [Tape] Itching and Rash    Specifically tegaderm, normal adhesive tape is ok    Social History:   Social History   Socioeconomic History  . Marital status: Married    Spouse name: [name]  . Number of children: 1  . Years of education: 32  . Highest education level: Not on  file  Occupational History  . Occupation: Disablity  Social Needs  . Financial resource strain: Not on file  . Food insecurity:    Worry: Not on file    Inability: Not on file  . Transportation needs:    Medical: Not on file    Non-medical: Not on file  Tobacco Use  . Smoking status: Never Smoker  . Smokeless tobacco: Never Used  Substance and Sexual Activity  . Alcohol use: Yes    Comment: social drinker   . Drug use: No  . Sexual activity: Not on file  Lifestyle  . Physical activity:    Days per week: Not on file    Minutes per session: Not on file  . Stress: Not on file  Relationships  . Social connections:      Talks on phone: Not on file    Gets together: Not on file    Attends religious service: Not on file    Active member of club or organization: Not on file    Attends meetings of clubs or organizations: Not on file    Relationship status: Not on file  . Intimate partner violence:    Fear of current or ex partner: Not on file    Emotionally abused: Not on file    Physically abused: Not on file    Forced sexual activity: Not on file  Other Topics Concern  . Not on file  Social History Narrative   Patient is married [name]) and lives at home with her family.   Patient has one child. A daughter   Patient is right-handed.   Patient has a college education.   Patient drinks some caffeine occasionally, but not everyday.   Regular exercise   She is disabled   02/23/2016   Updated    Family History:    Family History  Problem Relation Age of Onset  . Heart disease Mother   . Arthritis Mother   . Hypertension Mother   . Stroke Mother   . Fibromyalgia Mother   . Coronary artery disease Unknown   . Heart attack Unknown   . Heart disease Maternal Uncle   . Colon cancer Paternal Uncle        dx over 18  . Heart disease Maternal Grandmother   . Heart attack Maternal Grandmother   . Ovarian cancer Paternal Grandmother   . Fibromyalgia Sister   . Ovarian cancer Paternal Aunt      ROS:  Please see the history of present illness.  General:no colds or fevers, no weight changes Skin:no rashes or ulcers HEENT:no blurred vision, no congestion CV:see HPI PUL:see HPI GI:+ diarrhea + constipation + abd pain, no melena, no indigestion GU:no hematuria, no dysuria MS:no joint pain, + leg pain, no claudication Neuro:no syncope, no lightheadedness, but freq falls, this is due to loss of balance, to have PT Endo:+ diabetes, no thyroid disease  All other ROS reviewed and negative.     Physical Exam/Data:   Vitals:   09/14/17 0701 09/14/17 0730 09/14/17 0830 09/14/17 0900  BP: 97/76  (!) 80/52 (!) 70/49 (!) 79/59  Pulse: 91 84 77 87  Resp: 16 (!) 25 18 (!) 23  Temp:      TempSrc:      SpO2: 97% 99% 96% 93%  Weight:        Intake/Output Summary (Last 24 hours) at 09/14/2017 1052 Last data filed at 09/14/2017 0112 Gross per 24 hour  Intake 459.01 ml  Output -  Net 459.01 ml   Filed Weights   09/14/17 0358  Weight: 270 lb 3 oz (122.6 kg)   Body mass index is 42.32 kg/m.  General:  Well nourished, well developed, in no acute distress unless head is flat then SOB HEENT: normal Lymph: no adenopathy Neck: + JVD Endocrine:  No thryomegaly Vascular: No carotid bruits; 1+ pedal pulses bil.  Cardiac:  Somewhat irregular; no murmur gallup rub or click Lungs:  + breath sounds to auscultation bilaterally, though significant decrease in breath sounds.no wheezing, rhonchi or rales  Abd: obese, soft, nontender, no hepatomegaly  Ext: 1-2+  Edema lower ext, none in thighs or back Musculoskeletal:  No deformities, BUE and BLE strength normal and equal Skin: warm and dry  Neuro:  Awake and alert but falls to sleep - though she states she is not asleep just doesn't answer.  no focal abnormalities noted Psych:  Normal to flat affect    Telemetry:  Telemetry was personally reviewed and demonstrates: a flutter rate controlled  Relevant CV Studies: Echo done 09/14/17   Study Conclusions  - Left ventricle: The cavity size was mildly dilated. Wall   thickness was normal. Indeterminant diastolic function (atrial   flutter). Systolic function was severely reduced. The estimated   ejection fraction was in the range of 20% to 25%. Diffuse   hypokinesis. - Aortic valve: Poorly visualized. There was no stenosis. - Mitral valve: There was mild to moderate regurgitation. - Left atrium: The atrium was mildly dilated. - Right ventricle: Poorly visualized. The cavity size was mildly   dilated. Pacer wire or catheter noted in right ventricle.   Systolic function was mildly reduced. -  Right atrium: The atrium was mildly dilated. - Tricuspid valve: Peak RV-RA gradient (S): 23 mm Hg. - Pulmonary arteries: PA peak pressure: 38 mm Hg (S). - Systemic veins: IVC measured 2.9 cm with < 50% respirophasic   variation, suggesting RA pressure 15 mmHg.  Impressions:  - The patient was in atrial flutter. Mildly dilated LV with EF   20-25%, diffuse hypokinesis. RV poorly visualized but probably   mildly dilated and at least mildly hypokinetic. Mild-moderate   mitral regurgitation. Dilated IVC suggestive of elevated RV   filling pressure.  Complications:  Patient is extreemly tender on and around her left breast. Imaging very difficult. Echo completed with patient sitting upright, and unable to apply any pressure with the probe.  ECHO 10/28/17 Study Conclusions  - Left ventricle: The cavity size was severely dilated. Wall   thickness was normal. Systolic function was severely reduced. The   estimated ejection fraction was in the range of 20% to 25%.   Diffuse hypokinesis. Doppler parameters are consistent with both   elevated ventricular end-diastolic filling pressure and elevated   left atrial filling pressure. - Aortic valve: There was trivial regurgitation. - Mitral valve: There was moderate regurgitation. - Right atrium: The atrium was moderately dilated. - Atrial septum: No defect or patent foramen ovale was identified. - Tricuspid valve: There was severe regurgitation. - Pulmonary arteries: PA peak pressure: 46 mm Hg (S).   Cardiac cath 2012 ASSESSMENT: 1. Normal coronary arteries. 2. Severe left ventricular dysfunction with an ejection fraction of     20%-25%. 3. Normal filling pressures  Laboratory Data:  Chemistry Recent Labs  Lab 10/07/2017 2312 09/14/17 0113 09/14/17 0448  NA 130*  --  135  K >7.5* 3.1* 3.3*  CL 87*  --  90*  CO2 29  --  28  GLUCOSE  74  --  91  BUN 61*  --  60*  CREATININE 3.01*  --  2.96*  CALCIUM 9.3  --  9.5  GFRNONAA  17*  --  18*  GFRAA 20*  --  21*  ANIONGAP 14  --  17*    Recent Labs  Lab 09/22/2017 2312 09/14/17 0448  PROT 6.8 7.3  ALBUMIN 3.0* 3.0*  AST 122* 49*  ALT 18 31  ALKPHOS 135* 129*  BILITOT 11.5* 9.9*   Hematology Recent Labs  Lab 09/14/2017 2312  WBC 4.1  RBC 4.26  HGB 14.7  HCT 42.6  MCV 100.0  MCH 34.5*  MCHC 34.5  RDW 21.0*  PLT 139*   Cardiac EnzymesNo results for input(s): TROPONINI in the last 168 hours. No results for input(s): TROPIPOC in the last 168 hours.  BNP Recent Labs  Lab 09/14/17 0448  BNP 542.4*    DDimer No results for input(s): DDIMER in the last 168 hours.  Radiology/Studies:  Ct Abdomen Pelvis Wo Contrast  Result Date: 09/14/2017 CLINICAL DATA:  Abdominal distension and weakness. EXAM: CT ABDOMEN AND PELVIS WITHOUT CONTRAST TECHNIQUE: Multidetector CT imaging of the abdomen and pelvis was performed following the standard protocol without IV contrast. COMPARISON:  CT abdomen pelvis 08/03/2017 FINDINGS: LOWER CHEST: Severe cardiomegaly. HEPATOBILIARY: There is a patent steatosis. Hypoattenuating focus in the right hepatic lobe, likely a hepatic cyst. Status post cholecystectomy. PANCREAS: Normal parenchymal contours without ductal dilatation. No peripancreatic fluid collection. SPLEEN: Normal. ADRENALS/URINARY TRACT: --Adrenal glands: Normal. --Right kidney/ureter: No hydronephrosis, nephroureterolithiasis, perinephric stranding or solid renal mass. --Left kidney/ureter: No hydronephrosis, nephroureterolithiasis, perinephric stranding or solid renal mass. --Urinary bladder: Normal for degree of distention STOMACH/BOWEL: --Stomach/Duodenum: No hiatal hernia or other gastric abnormality. Normal duodenal course. --Small bowel: No dilatation or inflammation. --Colon: No focal abnormality. --Appendix: Not visualized. No right lower quadrant inflammation or free fluid. VASCULAR/LYMPHATIC: Normal course and caliber of the major abdominal vessels. No abdominal or  pelvic lymphadenopathy. REPRODUCTIVE: Status post hysterectomy. No adnexal mass. MUSCULOSKELETAL. No bony spinal canal stenosis or focal osseous abnormality. OTHER: None. IMPRESSION: Severe cardiomegaly and a pack steatosis. No acute abdominal or pelvic abnormality. Electronically Signed   By: Ulyses Jarred M.D.   On: 09/14/2017 03:10   Dg Chest Port 1 View  Result Date: 09/14/2017 CLINICAL DATA:  Congestive heart failure EXAM: PORTABLE CHEST 1 VIEW COMPARISON:  11/26/2014 FINDINGS: Progression of marked cardiac enlargement. Bibasilar atelectasis. Possible small left effusion. Negative for pulmonary edema. IMPRESSION: Marked cardiac enlargement.  Negative for edema. Bibasilar atelectasis left greater than right Electronically Signed   By: Franchot Gallo M.D.   On: 09/14/2017 10:43    Assessment and Plan:   1. A flutter new  Rate controlled adding IV heparin - may need TEE DCCV will need interrogation   2.          Acute on chronic systolic HF - no meds for 2 days except for torsemide per pt.  + pl effusion on CXR done today, no edema  --begin IV lasix -- 120 mg every 12 hours, may need metolazone --plan to transfer to [facility] ICU for further HF  Management with HF team.     3.         Elevated liver studies, most likely from volume overload.    4.          EF today 20- 25% PAP 38 and RA 15.  Hx NICM from chemo  Cath was in 2012   5.  Hypokalemia  Replace   6.            Hypotension at times, may be due to a flutter. Currently improved.  Normal in 07M systolic. She is warm and dry.    7.             Elevated HCG she has had hysterectomy.   8.            Breast cancer, underwent radiation --severe breast pain to touch on Lt.    9.            IBS, has seen GI may be due to Heart failure as well. For abd ultrasound and renal ultrasound   10           AKI may be due to acute HF, but is to have renal ultrasound.   11.          Conversion disorder, unsure how much this is playing a  role.  12.         Fibromyalgia per IM  13.         Fx ankle on Rt. Per IM and ortho.     For questions or updates, please contact [facility] Please consult [email or web address] for contact info under Cardiology/STEMI.   Signed, Cecilie Kicks, NP  09/14/2017 10:52 AM  I have seen and examined the patient along with Cecilie Kicks, NP .  I have reviewed the chart, notes and new data.  I agree with NP's note.  Key new complaints: She is able to hold a conversation for a while, but is easily distracted and confused, she appears to be mildly encephalopathic.  She has complaints of severe abdominal distention and dyspnea.  She has been unable to get on the scales and weigh herself since her right leg was placed in a cast. Key examination changes: She is jaundiced.  She has clear evidence of volume overload/anasarca.  3+ hard pitting edema of the left lower extremity, right lower extremity in a cast, abdominal distention, possible ascites.  Jugular veins appear to be distended to the angle of the jaw, S3 is present but lungs are clear.  Rapid irregular rhythm. Key new findings / data: Severely elevated bilirubin at 10-11, but remarkably little change in transaminases, creatinine 3.0 (well above her baseline), BNP is elevated, but actually lower than it was about a month ago.  Lactic acidosis is very mild.  Clear evidence of hemolysis. Interrogation of her defibrillator shows that she was in normal rhythm until roughly a week ago, when there is a suggestion that she may have developed atrial flutter with variable block on March 26. Optivol has been elevated for the last 3 months, up and down, marked decrease in thoracic impedance in the last week or so (clearly preceding the arrhythmia onset). Renal US - no hydronephrosis. Liver US - steatosis   PLAN: Cardiogenic shock ("cold and wet"). Would benefit from return to normal rhythm, but the atrial flutter appears to be a consequence, not the cause of  decompensation. Evidence of multiple system failure - hepatic insufficiency (jaundice, encephalopathy) and acute renal insufficiency. Critically ill. Multiple comorbid conditions and her prognosis is poor. Needs ICU care, central access for inotropes and CO monitoring. Will require multidisciplinary care, team approach.  Total time spent with patient 55 minutes.  Sanda Klein, MD, [place] [phone] 09/14/2017, 1:51 PM

## 2017-09-14 NOTE — Procedures (Signed)
Central Venous Catheter Insertion Procedure Note Neira Bentsen [id number] September 14, 1970  Procedure: Insertion of Central Venous Catheter Indications: Assessment of intravascular volume  Procedure Details Consent: Risks of procedure as well as the alternatives and risks of each were explained to the (patient/caregiver).  Consent for procedure obtained. Time Out: Verified patient identification, verified procedure, site/side was marked, verified correct patient position, special equipment/implants available, medications/allergies/relevent history reviewed, required imaging and test results available.  Performed  Maximum sterile technique was used including antiseptics, cap, gloves, gown, hand hygiene, mask and sheet. Skin prep: Chlorhexidine; local anesthetic administered A antimicrobial bonded/coated triple lumen catheter was placed in the right internal jugular vein using the Seldinger technique.  Evaluation Blood flow good Complications: No apparent complications Patient did tolerate procedure well. Chest X-ray ordered to verify placement.  CXR: normal.  Glori Bickers MD 09/14/2017, 8:11 PM

## 2017-09-14 NOTE — Progress Notes (Signed)
ANTICOAGULATION CONSULT NOTE - Initial Consult  Pharmacy Consult for IV heparin Indication: atrial fibrillation  Allergies  Allergen Reactions  . Ace Inhibitors Other (See Comments)    hyperkalemia  . Reglan [Metoclopramide Hcl] Anxiety  . Adhesive [Tape] Itching and Rash    Specifically tegaderm, normal adhesive tape is ok    Patient Measurements: Weight: 270 lb 3 oz (122.6 kg) Heparin Dosing Weight: 90.7  Vital Signs: BP: 79/59 (04/03 0900) Pulse Rate: 87 (04/03 0900)  Labs: Recent Labs    10/04/2017 2312 09/14/17 0448  HGB 14.7  --   HCT 42.6  --   PLT 139*  --   LABPROT  --  14.5  INR  --  1.14  CREATININE 3.01* 2.96*    Estimated Creatinine Clearance: 31.9 mL/min (A) (by C-G formula based on SCr of 2.96 mg/dL (H)).   Medical History: Past Medical History:  Diagnosis Date  . Acne   . AICD (automatic cardioverter/defibrillator) present   . Anemia 1980's   [id number]  . Anginal pain ([place])    sporatic , been going on for yrs,  . Anxiety   . Arthritis   . 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 Blood transfusion 1980's   1987 or 1988  . Breast calcifications    right breast  . Breast cancer (HCC)    right;  . 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 Cardiac MRI 05/2011 EF 32%. ICD implantation 10/2011 ([employer])  . 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 Conversion disorder   . 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 Depression   . Endometriosis   . Family history of colon cancer   . Family history of ovarian cancer   . Fibromyalgia   . GERD (gastroesophageal reflux disease)   . Headache   . Hepatomegaly   . History of radiation therapy 05/09/17- 06/08/2017   Left Breast treated to 40.05 Gy in 15 fractions of 2.67 Gy, followed by a 10 gy boost in 5 fractions to yield a total dose of 50.05 Gy to the Left Breast  . History of stomach ulcers   .  Hyperlipidemia   . Hypertension    c/b orthostatic hypotention  . ICD (implantable cardiac defibrillator) in place   . Insomnia   . Interstitial cystitis   . Left eye injury 12/2014  . 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   . Nonischemic cardiomyopathy ([place])    related to chemo; EF 30% 05/2011  . Obesity   . Palpitation    normal sinus rhythm only on 21 day heart monitor  . Panic attacks   . Peripheral neuropathy    chemo- induced  . Pneumonia 2000's   "once"  . Seasonal allergies   . Seizures ([place])    last seizure  last week: non elipsey seisure  . Type II diabetes mellitus (HCC)     Medications:  Scheduled:  . allopurinol  300 mg Oral Daily  . cycloSPORINE  1 drop Both Eyes BID  . dicyclomine  20 mg Oral Q6H  . divalproex  750 mg Oral BID  . DULoxetine  30 mg Oral BID  . ferrous sulfate  325 mg Oral Q breakfast  . letrozole  2.5 mg Oral Daily  . loratadine  10 mg Oral Daily  . multivitamin with minerals  1 tablet Oral Daily  . olopatadine  1 drop Both Eyes BID  . pantoprazole  80 mg Oral BID  . venlafaxine XR  37.5 mg Oral Q breakfast   Infusions:    Assessment: 47 yo female to start IV heparin per Rx for AFib. On no anticoagulation prior to admission. Baseline labs drawn  Goal of Therapy:  Heparin level 0.3-0.7 units/ml Monitor platelets by anticoagulation protocol: Yes   Plan:  1) IV heparin bolus of 4000 units then  2) IV heparin rate of 1200 units/hr  3) Check heparin rate 8 hours after start of IV heparin 4) Daily heparin level and CBC   Adrian Saran, PharmD, BCPS Pager [phone] 09/14/2017 10:40 AM

## 2017-09-14 NOTE — Progress Notes (Signed)
PROGRESS NOTE  [name] EPP:[id number] DOB: [birth year] DOA: 09/15/2017 PCP: Mellody Dance, DO  HPI/Recap of past 24 hours:  Hypotensive, in aflutter, she is confused Husband at bedside  Assessment/Plan: Principal Problem:   Acute kidney failure ([place]) Active Problems:   Chronic systolic CHF (congestive heart failure) (HCC)   Hypotension   Conversion disorder   ICD in place (MDT 2013)   Diabetes mellitus, type II ([place])   Jaundice   Acute systolic heart failure ([place])  Patient is critically ill suspect cardiogenic shock -She is hypotensive and confused, with acute renal failure and shock liver -Stat echo ordered, renal ultrasound abdomen ultrasound also ordered -Case discussed with cardiology -Patient will be transferred to [facility] cardiac ICU  Acute on chronic systolic heart failure -She has edema bilateral lower extremity -She is started on IV Lasix  A flutter -Cardiology consulted, started on heparin drip, plan for TEE cardioversion when stable  recurrent breast cancer Status post mastectomy, chemo and radiation  Code Status: full  Family Communication: patient and husband at bedside  Disposition Plan: transfer to [facility] cardiac ICU   Consultants:  cardiology  Procedures:  none  Antibiotics:  none   Objective: BP 109/60   Pulse 86   Temp 98.2 F (36.8 C) (Oral)   Resp (!) 22   Wt 122.6 kg (270 lb 3 oz)   SpO2 96%   BMI 42.32 kg/m   Intake/Output Summary (Last 24 hours) at 09/14/2017 1410 Last data filed at 09/14/2017 1406 Gross per 24 hour  Intake 521.01 ml  Output -  Net 521.01 ml   Filed Weights   09/14/17 0358  Weight: 122.6 kg (270 lb 3 oz)    Exam: Patient is examined daily including today on 09/14/2017, exams remain the same as of yesterday except that has changed    General:  confused  Cardiovascular: aluftter  Respiratory: CTABL  Abdomen: Soft/ND/NT, positive BS  Musculoskeletal: bilateral lower extremity  pitting Edema, right leg in case  Neuro: confused  Data Reviewed: Basic Metabolic Panel: Recent Labs  Lab 10/10/2017 2312 09/14/17 0113 09/14/17 0448  NA 130*  --  135  K >7.5* 3.1* 3.3*  CL 87*  --  90*  CO2 29  --  28  GLUCOSE 74  --  91  BUN 61*  --  60*  CREATININE 3.01*  --  2.96*  CALCIUM 9.3  --  9.5   Liver Function Tests: Recent Labs  Lab 09/21/2017 2312 09/14/17 0448  AST 122* 49*  ALT 18 31  ALKPHOS 135* 129*  BILITOT 11.5* 9.9*  PROT 6.8 7.3  ALBUMIN 3.0* 3.0*   No results for input(s): LIPASE, AMYLASE in the last 168 hours. Recent Labs  Lab 09/14/17 0113  AMMONIA 39*   CBC: Recent Labs  Lab 09/16/2017 2312  WBC 4.1  NEUTROABS 2.6  HGB 14.7  HCT 42.6  MCV 100.0  PLT 139*   Cardiac Enzymes:   No results for input(s): CKTOTAL, CKMB, CKMBINDEX, TROPONINI in the last 168 hours. BNP (last 3 results) Recent Labs    07/29/17 1148 09/14/17 0448  BNP 724.5* 542.4*    ProBNP (last 3 results) No results for input(s): PROBNP in the last 8760 hours.  CBG: Recent Labs  Lab 09/21/2017 2156  GLUCAP 84    No results found for this or any previous visit (from the past 240 hour(s)).   Studies: Ct Abdomen Pelvis Wo Contrast  Result Date: 09/14/2017 CLINICAL DATA:  Abdominal distension and  weakness. EXAM: CT ABDOMEN AND PELVIS WITHOUT CONTRAST TECHNIQUE: Multidetector CT imaging of the abdomen and pelvis was performed following the standard protocol without IV contrast. COMPARISON:  CT abdomen pelvis 08/03/2017 FINDINGS: LOWER CHEST: Severe cardiomegaly. HEPATOBILIARY: There is a patent steatosis. Hypoattenuating focus in the right hepatic lobe, likely a hepatic cyst. Status post cholecystectomy. PANCREAS: Normal parenchymal contours without ductal dilatation. No peripancreatic fluid collection. SPLEEN: Normal. ADRENALS/URINARY TRACT: --Adrenal glands: Normal. --Right kidney/ureter: No hydronephrosis, nephroureterolithiasis, perinephric stranding or solid renal  mass. --Left kidney/ureter: No hydronephrosis, nephroureterolithiasis, perinephric stranding or solid renal mass. --Urinary bladder: Normal for degree of distention STOMACH/BOWEL: --Stomach/Duodenum: No hiatal hernia or other gastric abnormality. Normal duodenal course. --Small bowel: No dilatation or inflammation. --Colon: No focal abnormality. --Appendix: Not visualized. No right lower quadrant inflammation or free fluid. VASCULAR/LYMPHATIC: Normal course and caliber of the major abdominal vessels. No abdominal or pelvic lymphadenopathy. REPRODUCTIVE: Status post hysterectomy. No adnexal mass. MUSCULOSKELETAL. No bony spinal canal stenosis or focal osseous abnormality. OTHER: None. IMPRESSION: Severe cardiomegaly and a pack steatosis. No acute abdominal or pelvic abnormality. Electronically Signed   By: Ulyses Jarred M.D.   On: 09/14/2017 03:10   US Renal  Result Date: 09/14/2017 CLINICAL DATA:  Elevated creatinine EXAM: RENAL / URINARY TRACT ULTRASOUND COMPLETE COMPARISON:  None. FINDINGS: Right Kidney: Length: 9.6 cm. Echogenicity within normal limits. No mass or hydronephrosis visualized. Left Kidney: Length: 10.1 cm. Echogenicity within normal limits. No mass or hydronephrosis visualized. Bladder: Appears normal for degree of bladder distention. IMPRESSION: No acute abnormality noted. Electronically Signed   By: Inez Catalina M.D.   On: 09/14/2017 11:44   US Abdomen Limited  Result Date: 09/14/2017 CLINICAL DATA:  Diabetes.  Obesity.  Enlarged liver. EXAM: ULTRASOUND ABDOMEN LIMITED RIGHT UPPER QUADRANT COMPARISON:  CT earlier same day. FINDINGS: Gallbladder: Surgically absent. Common bile duct: Diameter: 6 mm common normal Liver: Enlarged liver showing increased echogenicity consistent with diffuse steatosis. Portal vein is patent on color Doppler imaging with normal direction of blood flow towards the liver. IMPRESSION: Enlarged liver with increased echogenicity consistent with diffuse steatosis.  Electronically Signed   By: Nelson Chimes M.D.   On: 09/14/2017 11:57   Dg Chest Port 1 View  Result Date: 09/14/2017 CLINICAL DATA:  Congestive heart failure EXAM: PORTABLE CHEST 1 VIEW COMPARISON:  11/26/2014 FINDINGS: Progression of marked cardiac enlargement. Bibasilar atelectasis. Possible small left effusion. Negative for pulmonary edema. IMPRESSION: Marked cardiac enlargement.  Negative for edema. Bibasilar atelectasis left greater than right Electronically Signed   By: Franchot Gallo M.D.   On: 09/14/2017 10:43    Scheduled Meds: . allopurinol  300 mg Oral Daily  . cycloSPORINE  1 drop Both Eyes BID  . dicyclomine  20 mg Oral Q6H  . divalproex  750 mg Oral BID  . DULoxetine  30 mg Oral BID  . ferrous sulfate  325 mg Oral Q breakfast  . letrozole  2.5 mg Oral Daily  . loratadine  10 mg Oral Daily  . multivitamin with minerals  1 tablet Oral Daily  . olopatadine  1 drop Both Eyes BID  . pantoprazole  80 mg Oral BID  . potassium chloride  40 mEq Oral BID  . venlafaxine XR  37.5 mg Oral Q breakfast    Continuous Infusions: . furosemide Stopped (09/14/17 1406)  . heparin 1,200 Units/hr (09/14/17 1142)   Total Critical Care time in examining the patient bedside, evaluating Lab work and other data, over half of the total time was  spent in coordinating patient care on the floor or bedisde in talking to patient/family members, communicating with nursing Staff on the floor and sub specialists cardiology to coordinate patients medical care and needs is 65 Minutes.  The condition which has caused critical injury/acute impairment of  vital organ system with a high probability of sudden clinically significant deterioration and can cause Potential Life threatening injury to this patient addressed today is cardiogenic shock,  Aflutter, acute kidney failure, acute elevated lft, encephalopathy, hypotension   I have personally reviewed and interpreted on  09/14/2017 daily labs, tele strips, imagings  as discussed above under date review session and assessment and plans.  I reviewed all nursing notes, pharmacy notes, consultant notes,  vitals, pertinent old records  I have discussed plan of care as described above with RN , patient and family on 09/14/2017   Florencia Reasons MD, PhD  [facility] Pager [phone]. If 7PM-7AM, please contact night-coverage at [email or web address], password [facility] 09/14/2017, 2:10 PM  LOS: 0 days

## 2017-09-14 NOTE — Progress Notes (Signed)
  Echocardiogram 2D Echocardiogram with definity has been performed.  [name] M 09/14/2017, 9:53 AM

## 2017-09-14 NOTE — Progress Notes (Signed)
  Amiodarone Drug - Drug Interaction Consult Note  Recommendations: Monitor electrolytes closely on both amiodarone and furosemide. Monitor for QT prolongation with amiodarone and venlafaxine.   Amiodarone is metabolized by the cytochrome P450 system and therefore has the potential to cause many drug interactions. Amiodarone has an average plasma half-life of 50 days (range 20 to 100 days).   There is potential for drug interactions to occur several weeks or months after stopping treatment and the onset of drug interactions may be slow after initiating amiodarone.   []  Statins: Increased risk of myopathy. Simvastatin- restrict dose to 20mg  daily. Other statins: counsel patients to report any muscle pain or weakness immediately.  []  Anticoagulants: Amiodarone can increase anticoagulant effect. Consider warfarin dose reduction. Patients should be monitored closely and the dose of anticoagulant altered accordingly, remembering that amiodarone levels take several weeks to stabilize.  []  Antiepileptics: Amiodarone can increase plasma concentration of phenytoin, the dose should be reduced. Note that small changes in phenytoin dose can result in large changes in levels. Monitor patient and counsel on signs of toxicity.  []  Beta blockers: increased risk of bradycardia, AV block and myocardial depression. Sotalol - avoid concomitant use.  []   Calcium channel blockers (diltiazem and verapamil): increased risk of bradycardia, AV block and myocardial depression.  []   Cyclosporine: Amiodarone increases levels of cyclosporine. Reduced dose of cyclosporine is recommended. (Patient is on eye drops only)  []  Digoxin dose should be halved when amiodarone is started.  [x]  Diuretics: increased risk of cardiotoxicity if hypokalemia occurs.  []  Oral hypoglycemic agents (glyburide, glipizide, glimepiride): increased risk of hypoglycemia. Patient's glucose levels should be monitored closely when initiating  amiodarone therapy.   [x]  Drugs that prolong the QT interval:  Torsades de pointes risk may be increased with concurrent use - avoid if possible.  Monitor QTc, also keep magnesium/potassium WNL if concurrent therapy can't be avoided. Marland Kitchen Antibiotics: e.g. fluoroquinolones, erythromycin. . Antiarrhythmics: e.g. quinidine, procainamide, disopyramide, sotalol. . Antipsychotics: e.g. phenothiazines, haloperidol.  . Lithium, tricyclic antidepressants, and methadone. Thank You,  Brain Hilts  09/14/2017 7:52 PM

## 2017-09-14 NOTE — ED Notes (Signed)
Echo in room

## 2017-09-14 NOTE — Progress Notes (Signed)
Attempted left radial arterial line X 2 unsuccessful. Pt has a very faint weak pulse. Was able to get blood return but catheter would not thread through arterial lumen. No hematoma noted. Pressure applied to prevent bleeding. Dressing applied. RN aware. Pt is stable at this time. Patient Sister at bedside.

## 2017-09-14 NOTE — ED Notes (Signed)
Patient refused [place] at this time.

## 2017-09-14 NOTE — H&P (Addendum)
History and Physical    [name] EVO:[id number] DOB: 30-Nov-1970 DOA: 09/27/2017  PCP: Mellody Dance, DO  Patient coming from: Home  I have personally briefly reviewed patient's old medical records in [place]  Chief Complaint: Generalized weakness  HPI: [name] is a 47 y.o. female with medical history significant of NICM, EF 20-25%, AICD, fibromyalgia, conversion disorder.  Patient presents to the ED with c/o fatigue, generalized weakness, confusion, feeling unsteady, and falls.  This ongoing for the past month or more.  She stopped her meds 2 days ago to see if this would help, but didn't really help.   ED Course: Bili 11 up from 1.0.  Creat 3 up from 1.  K initially read as >7.5 however was hemolyzed, on repeat K is 3.1.  No EKG changes of hyperkalemia either.  Previous labs are mid Feb this year.  Wt is essentially identical to March 1st.  CT abd/pelvis w/o contrast shows no acute findings.  No pulm edema.  Given 500 cc NS in ED.   Review of Systems: As per HPI otherwise 10 point review of systems negative.   Past Medical History:  Diagnosis Date  . Acne   . AICD (automatic cardioverter/defibrillator) present   . Anemia 1980's   [id number]  . Anginal pain ([place])    sporatic , been going on for yrs,  . Anxiety   . Arthritis   . 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 Blood transfusion 1980's   1987 or 1988  . Breast calcifications    right breast  . Breast cancer (HCC)    right;  . CHF (congestive heart failure) ([place])    due to non-ischemic cardiomyopathy, thought to be chemotherapy induced;  cath 7/12: normal cors, EF 20-25%. Cardiac MRI 05/2011 EF 32%. ICD implantation 10/2011 ([employer])  . 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 Conversion disorder   . 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 Depression   . Endometriosis   . Family history of colon cancer   . Family history of ovarian cancer   . Fibromyalgia   . GERD (gastroesophageal reflux  disease)   . Headache   . Hepatomegaly   . History of radiation therapy 05/09/17- 06/08/2017   Left Breast treated to 40.05 Gy in 15 fractions of 2.67 Gy, followed by a 10 gy boost in 5 fractions to yield a total dose of 50.05 Gy to the Left Breast  . History of stomach ulcers   . Hyperlipidemia   . Hypertension    c/b orthostatic hypotention  . ICD (implantable cardiac defibrillator) in place   . Insomnia   . Interstitial cystitis   . Left eye injury 12/2014  . 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   . Nonischemic cardiomyopathy ([place])    related to chemo; EF 30% 05/2011  . Obesity   . Palpitation    normal sinus rhythm only on 21 day heart monitor  . Panic attacks   . Peripheral neuropathy    chemo- induced  . Pneumonia 2000's   "once"  . Seasonal allergies   . Seizures ([place])    last seizure  last week: non elipsey seisure  . Type II diabetes mellitus ([place])     Past Surgical History:  Procedure Laterality Date  . ABDOMINAL HYSTERECTOMY     partial  . BREAST EXCISIONAL BIOPSY     right benign 2012  . BREAST LUMPECTOMY  2008; 2013   right radiation  .  BREAST LUMPECTOMY WITH RADIOACTIVE SEED AND SENTINEL LYMPH NODE BIOPSY Left 02/21/2017   Procedure: LEFT BREAST LUMPECTOMY WITH RADIOACTIVE SEED AND LEFT AXILLARY DEEP SENTINEL LYMPH NODE BIOPSY;  Surgeon: Fanny Skates, MD;  Location: [facility];  Service: General;  Laterality: Left;  . CARDIAC DEFIBRILLATOR PLACEMENT  11/03/11  . CESAREAN SECTION    . CHOLECYSTECTOMY  ~ 2009  . COLONOSCOPY    . ICD REVISION N/A 02/21/2017   Procedure: ICD Revision;  Surgeon: Deboraha Sprang, MD;  Location: [facility];  Service: Cardiovascular;  Laterality: N/A;  . IMPLANTABLE CARDIOVERTER DEFIBRILLATOR IMPLANT N/A 11/03/2011   Procedure: IMPLANTABLE CARDIOVERTER DEFIBRILLATOR IMPLANT;  Surgeon: Deboraha Sprang, MD; MDT   . LAPAROSCOPIC ENDOMETRIOSIS FULGURATION   1990's  . NASAL SINUS SURGERY    . [place] REMOVAL  2010?   left chest; placed in 2008  . TONSILLECTOMY  1980's  . TUBAL LIGATION  1990's     reports that she has never smoked. She has never used smokeless tobacco. She reports that she drinks alcohol. She reports that she does not use drugs.  Allergies  Allergen Reactions  . Ace Inhibitors Other (See Comments)    hyperkalemia  . Reglan [Metoclopramide Hcl] Anxiety  . Adhesive [Tape] Itching and Rash    Specifically tegaderm, normal adhesive tape is ok    Family History  Problem Relation Age of Onset  . Heart disease Mother   . Arthritis Mother   . Hypertension Mother   . Stroke Mother   . Fibromyalgia Mother   . Coronary artery disease Unknown   . Heart attack Unknown   . Heart disease Maternal Uncle   . Colon cancer Paternal Uncle        dx over 23  . Heart disease Maternal Grandmother   . Heart attack Maternal Grandmother   . Ovarian cancer Paternal Grandmother   . Fibromyalgia Sister   . Ovarian cancer Paternal Aunt      Prior to Admission medications   Medication Sig Start Date End Date Taking? Authorizing Provider  acetaminophen (TYLENOL) 500 MG tablet Take 1,000 mg by mouth 2 (two) times daily as needed for moderate pain.   Yes [provider]  albuterol (PROVENTIL HFA;VENTOLIN HFA) 108 (90 Base) MCG/ACT inhaler Inhale 1-2 puffs into the lungs every 6 (six) hours as needed for wheezing or shortness of breath.    Yes [provider]  allopurinol (ZYLOPRIM) 300 MG tablet Take 300 mg by mouth daily. 03/22/17  Yes [provider]  baclofen (LIORESAL) 10 MG tablet TAKE 1 TABLET BY MOUTH 2 (TWO) TIMES DAILY AS NEEDED FOR MUSCLE SPASMS. Patient taking differently: TAKE 1 TABLET BY MOUTH 3 TIMES DAILY AS NEEDED FOR MUSCLE SPASMS. 08/14/15  Yes Calone, Ples Specter, FNP  BLACK CURRANT SEED OIL PO Take 15 mLs by mouth daily.    Yes [provider]  carvedilol (COREG) 25 MG tablet Take 0.5  tablets (12.5 mg total) by mouth 2 (two) times daily with a meal. 11/27/14  Yes Kilroy, Doreene Burke, PA-C  Cholecalciferol (VITAMIN D3) 5000 UNITS CAPS Take 5,000 Units by mouth daily.    Yes [provider]  Coenzyme Q10 (CO Q-10) 100 MG CAPS Take 100 mg by mouth daily.   Yes [provider]  Cyanocobalamin 2500 MCG SUBL Place 2,500 mcg under the tongue daily.   Yes [provider]  dicyclomine (BENTYL) 20 MG tablet Take 1 tablet (20 mg total) by mouth every 6 (six) hours. 08/04/17  Yes Gatha Mayer, MD  digoxin (LANOXIN) 0.125 MG tablet TAKE 0.5 TABLETS (0.0625 MG TOTAL) BY MOUTH DAILY. 06/29/17  Yes Bensimhon, Shaune Pascal, MD  divalproex (DEPAKOTE ER) 500 MG 24 hr tablet Take 1.5 tablets (78m) twice a day 07/06/17  Yes Dohmeier, CAsencion Partridge MD  DULoxetine (CYMBALTA) 30 MG capsule TAKE 1 CAPSULE BY MOUTH TWICE A DAY 07/25/17  Yes Dohmeier, CAsencion Partridge MD  ferrous sulfate 325 (65 FE) MG tablet Take 325 mg by mouth daily with breakfast.   Yes [provider]  gabapentin (NEURONTIN) 100 MG capsule Take 2 capsules by mouth 3 (three) times daily as needed (pain).  07/06/17  Yes [provider]  GLUCOSAMINE-CHONDROITIN PO Take 1,200 mg by mouth daily.   Yes [provider]  hyaluronate sodium (RADIAPLEXRX) GEL Apply 1 application topically once.   Yes [provider]  hydrOXYzine (ATARAX/VISTARIL) 25 MG tablet Take 25 mg by mouth at bedtime as needed for itching (DOESN'T TAKE WITH TRAZODONE).   Yes [provider]  letrozole (FEMARA) 2.5 MG tablet Take 1 tablet (2.5 mg total) by mouth daily. 02/01/17  Yes Magrinat, GVirgie Dad MD  loratadine (CLARITIN) 10 MG tablet Take 10 mg by mouth daily.   Yes [provider]  losartan (COZAAR) 25 MG tablet Take 1 tablet (25 mg total) by mouth daily. 10/18/16  Yes Bensimhon, DShaune Pascal MD  Menthol, Topical Analgesic, (ICY HOT EX) Apply 1 application topically daily as needed (muscle pain).   Yes [provider]  metolazone (ZAROXOLYN) 2.5 MG tablet Take 1 tablet (2.5 mg total) by mouth daily as needed (edema). 07/29/17  Yes Bensimhon, DShaune Pascal MD  Milk Thistle 150 MG CAPS Take 450 mg by mouth daily.    Yes [provider]  Multiple Vitamin (MULTIVITAMIN WITH MINERALS) TABS Take 1 tablet by mouth daily. Reported on 10/13/2015   Yes [provider]  non-metallic deodorant (Jethro Poling MISC Apply 1 application topically daily as needed (deo).    Yes [provider]  omeprazole (PRILOSEC) 40 MG capsule TAKE 1 CAPSULE (40 MG TOTAL) BY MOUTH 2 (TWO) TIMES DAILY BEFORE A MEAL. BREAKFAST AND SUPPER 03/15/17  Yes GGatha Mayer MD  ondansetron (ZOFRAN) 4 MG tablet Take 1 tablet (4 mg total) by mouth every 8 (eight) hours as needed for nausea or vomiting. 10/29/16  Yes Bensimhon, DShaune Pascal MD  Oxycodone HCl 10 MG TABS Take 10 mg by mouth every 6 (six) hours as needed (pain).    Yes [provider]  PATADAY 0.2 % SOLN Place 1 drop into both eyes daily as needed (allergies).  01/22/13  Yes [provider]  pentosan polysulfate (ELMIRON) 100 MG capsule Take 100 mg by mouth 2 (two) times daily as needed (bladder spasms).    Yes [provider]  potassium chloride SA (K-DUR,KLOR-CON) 20 MEQ tablet Take 40 mEq by mouth 2 (two) times daily.   Yes [provider]  RESTASIS 0.05 % ophthalmic emulsion Place 1 drop into both eyes 2 (two) times daily.  03/31/17  Yes [provider]  Sodium Chloride-Sodium Bicarb (NETI POT SINUS WBryantNA) Place 1 Dose into the nose daily as needed (congestion).   Yes [provider]  spironolactone (ALDACTONE) 25 MG tablet Take 1 tablet (25 mg total) daily by mouth. 04/28/17  Yes Bensimhon, DShaune Pascal MD  torsemide (DEMADEX) 20 MG tablet TAKE 2 TABLETS (40 MG TOTAL) BY MOUTH DAILY. 11/02/16  Yes MLarey Dresser MD  traZODone (DESYREL) 50  MG tablet Take 50 mg by mouth at bedtime as needed for sleep.  10/29/14  Yes  [provider]  triamcinolone cream (KENALOG) 0.1 % Apply 1 application topically 2 (two) times daily as needed (eczema).   Yes [provider]  venlafaxine XR (EFFEXOR-XR) 37.5 MG 24 hr capsule Take 1 capsule (37.5 mg total) by mouth daily with breakfast. 04/05/17  Yes Magrinat, Virgie Dad, MD  Vitamin D, Ergocalciferol, (DRISDOL) 50000 units CAPS capsule Take 1 capsule (50,000 Units total) by mouth every 7 (seven) days. 03/14/17  Yes Opalski, Deborah, DO  Blood Glucose Monitoring Suppl (FREESTYLE FREEDOM LITE) w/Device KIT Check fasting and 2 hour PP 03/22/17   Opalski, Deborah, DO  FREESTYLE UNISTICK II LANCETS MISC Check fasting blood sugar in the morning and check sugar after largest meal of the day. 03/22/17   Opalski, Deborah, DO  glucose blood (FREESTYLE TEST STRIPS) test strip To be filled with Freedom Lite test strips.   Check fasting blood sugar in the morning and check sugar after largest meal of the day. 03/22/17   Mellody Dance, DO    Physical Exam: Vitals:   09/14/17 0019 09/14/17 0130 09/14/17 0200 09/14/17 0330  BP: 92/66 (!) 82/69 (!) 87/65 (!) 73/60  Pulse: 82 85  91  Resp: 19 19 (!) 23 19  Temp:      TempSrc:      SpO2: 93% 97%  98%    Constitutional: NAD, calm, comfortable Eyes: PERRL, lids and conjunctivae Icteric ENMT: Mucous membranes are moist. Posterior pharynx clear of any exudate or lesions.Normal dentition.  Neck: normal, supple, no masses, no thyromegaly Respiratory: clear to auscultation bilaterally, no wheezing, no crackles. Normal respiratory effort. No accessory muscle use.  Cardiovascular: 2+ BLE pitting edema Abdomen: no tenderness, no masses palpated. No hepatosplenomegaly. Bowel sounds positive.  Musculoskeletal: no clubbing / cyanosis. No joint deformity upper and lower extremities. Good ROM, no contractures. Normal muscle tone.  Skin: no rashes, lesions, ulcers. No induration Neurologic: CN 2-12 grossly intact. Sensation intact, DTR  normal. Strength 5/5 in all 4.  Psychiatric: Normal judgment and insight. Alert and oriented x 3. Normal mood.    Labs on Admission: I have personally reviewed following labs and imaging studies  CBC: Recent Labs  Lab 10/09/2017 2312  WBC 4.1  NEUTROABS 2.6  HGB 14.7  HCT 42.6  MCV 100.0  PLT 800*   Basic Metabolic Panel: Recent Labs  Lab 10/05/2017 2312 09/14/17 0113  NA 130*  --   K >7.5* 3.1*  CL 87*  --   CO2 29  --   GLUCOSE 74  --   BUN 61*  --   CREATININE 3.01*  --   CALCIUM 9.3  --    GFR: CrCl cannot be calculated (Unknown ideal weight.). Liver Function Tests: Recent Labs  Lab 10/10/2017 2312  AST 122*  ALT 18  ALKPHOS 135*  BILITOT 11.5*  PROT 6.8  ALBUMIN 3.0*   No results for input(s): LIPASE, AMYLASE in the last 168 hours. Recent Labs  Lab 09/14/17 0113  AMMONIA 39*   Coagulation Profile: No results for input(s): INR, PROTIME in the last 168 hours. Cardiac Enzymes: No results for input(s): CKTOTAL, CKMB, CKMBINDEX, TROPONINI in the last 168 hours. BNP (last 3 results) No results for input(s): PROBNP in the last 8760 hours. HbA1C: No results for input(s): HGBA1C in the last 72 hours. CBG: Recent Labs  Lab 09/22/2017 2156  GLUCAP 84   Lipid Profile: No results for input(s):  CHOL, HDL, LDLCALC, TRIG, CHOLHDL, LDLDIRECT in the last 72 hours. Thyroid Function Tests: No results for input(s): TSH, T4TOTAL, FREET4, T3FREE, THYROIDAB in the last 72 hours. Anemia Panel: No results for input(s): VITAMINB12, FOLATE, FERRITIN, TIBC, IRON, RETICCTPCT in the last 72 hours. Urine analysis:    Component Value Date/Time   COLORURINE AMBER (A) 09/14/2017 0122   APPEARANCEUR CLEAR 09/14/2017 0122   LABSPEC 1.013 09/14/2017 0122   PHURINE 5.0 09/14/2017 0122   GLUCOSEU NEGATIVE 09/14/2017 0122   HGBUR NEGATIVE 09/14/2017 0122   BILIRUBINUR SMALL (A) 09/14/2017 0122   KETONESUR NEGATIVE 09/14/2017 0122   PROTEINUR NEGATIVE 09/14/2017 0122    UROBILINOGEN 0.2 05/02/2008 1222   NITRITE NEGATIVE 09/14/2017 0122   LEUKOCYTESUR NEGATIVE 09/14/2017 0122    Radiological Exams on Admission: Ct Abdomen Pelvis Wo Contrast  Result Date: 09/14/2017 CLINICAL DATA:  Abdominal distension and weakness. EXAM: CT ABDOMEN AND PELVIS WITHOUT CONTRAST TECHNIQUE: Multidetector CT imaging of the abdomen and pelvis was performed following the standard protocol without IV contrast. COMPARISON:  CT abdomen pelvis 08/03/2017 FINDINGS: LOWER CHEST: Severe cardiomegaly. HEPATOBILIARY: There is a patent steatosis. Hypoattenuating focus in the right hepatic lobe, likely a hepatic cyst. Status post cholecystectomy. PANCREAS: Normal parenchymal contours without ductal dilatation. No peripancreatic fluid collection. SPLEEN: Normal. ADRENALS/URINARY TRACT: --Adrenal glands: Normal. --Right kidney/ureter: No hydronephrosis, nephroureterolithiasis, perinephric stranding or solid renal mass. --Left kidney/ureter: No hydronephrosis, nephroureterolithiasis, perinephric stranding or solid renal mass. --Urinary bladder: Normal for degree of distention STOMACH/BOWEL: --Stomach/Duodenum: No hiatal hernia or other gastric abnormality. Normal duodenal course. --Small bowel: No dilatation or inflammation. --Colon: No focal abnormality. --Appendix: Not visualized. No right lower quadrant inflammation or free fluid. VASCULAR/LYMPHATIC: Normal course and caliber of the major abdominal vessels. No abdominal or pelvic lymphadenopathy. REPRODUCTIVE: Status post hysterectomy. No adnexal mass. MUSCULOSKELETAL. No bony spinal canal stenosis or focal osseous abnormality. OTHER: None. IMPRESSION: Severe cardiomegaly and a pack steatosis. No acute abdominal or pelvic abnormality. Electronically Signed   By: Ulyses Jarred M.D.   On: 09/14/2017 03:10    EKG: Independently reviewed.  Assessment/Plan Principal Problem:   Acute kidney failure (HCC) Active Problems:   Chronic systolic CHF (congestive  heart failure) (HCC)   Hypotension   Conversion disorder   ICD in place (MDT 2013)   Diabetes mellitus, type II ([facility])   Jaundice   Assessment: 1. AKF - 1. DDx include pre-renal, venous congestion of renal vein (hepatorenal / cardiorenal syndromes), medication induced. 2. Jaundice - 1. DDx includes intrinsic liver causes vs hepatocardiac syndrome 3. Chronic systolic CHF - 1. With ICD in place 2. Seems to be euvolemic as far as I can tell, WT is within 1 lb of her baseline from beginning of March 4. Low BP - SBP in the 90s to low 100s.  But looks like this is chronic, SBP was in the 90s at last office visit in Feb.  Discussion and plan -  5. Spoke with Dr. Raiford Simmonds 1. He is concerned about cardiogenic shock 2. Requests INR and Lactate be ordered (have ordered these) 3. Less suspicious that venous congestion to blame for bilirubin elevation (states that typically the liver dysfunction seen due to CHF is transaminitis, not hyperbilirubinemia) 4. Requested that we talk with PCCM about ICU bed [facility] in case they need to start pressors 6. I am less suspicious of an acute cardiogenic shock given lack of pulmonary edema findings 7. Did speak with Dr. Nelda Marseille 1. Dr. Nelda Marseille agreed that SDU was reasonable for now. 8.  Holding losartan 9. Holding all diuretics until cards can come and evaluate patient 10. Recheck CMP this AM 11. Have put in for SD[facility] bed at [facility] 12. Tele monitor  DVT prophylaxis: SCDs for now (INR still pending) Code Status: Full Family Communication: Family at bedside Disposition Plan: Home after admit Consults called: Dr. Raiford Simmonds (cards) Admission status: Admit to inpatient [facility] at [facility]   Brimhall Nizhoni, [facility] Pager [phone]  If 7AM-7PM, please contact day team taking care of patient [email or web address] Password [facility]  09/14/2017, 3:48 AM

## 2017-09-14 NOTE — Progress Notes (Signed)
MD ordered 25 mcg of Fentanyl and 1 mg of versed for central line placement.  RN Doretha Sou over-rode pyxis to pull medications.  75 mcg of fentanyl wasted in sink and 1 mg of versed wasted in sink witnessed by Doretha Sou RN

## 2017-09-14 NOTE — Progress Notes (Signed)
ANTICOAGULATION CONSULT NOTE - Follow Up Consult  Pharmacy Consult for Heparin Indication: atrial fibrillation  Allergies  Allergen Reactions  . Ace Inhibitors Other (See Comments)    hyperkalemia  . Reglan [Metoclopramide Hcl] Anxiety  . Adhesive [Tape] Itching and Rash    Specifically tegaderm, normal adhesive tape is ok    Patient Measurements: Height: 5\' 7"  (170.2 cm) Weight: 266 lb 1.5 oz (120.7 kg) IBW/kg (Calculated) : 61.6 Heparin Dosing Weight: 90.1 kg  Vital Signs: BP: 80/65 (04/03 2200) Pulse Rate: 105 (04/03 2200)  Labs: Recent Labs    09/28/2017 2312 09/14/17 0448 09/14/17 2020  HGB 14.7  --   --   HCT 42.6  --   --   PLT 139*  --   --   LABPROT  --  14.5  --   INR  --  1.14  --   HEPARINUNFRC  --   --  <0.10*  CREATININE 3.01* 2.96*  --     Estimated Creatinine Clearance: 31.6 mL/min (A) (by C-G formula based on SCr of 2.96 mg/dL (H)).   Medications:  Infusions:  . sodium chloride    . amiodarone 60 mg/hr (09/14/17 2007)   Followed by  . [START ON 09/15/2017] amiodarone    . furosemide Stopped (09/14/17 1406)  . heparin 1,200 Units/hr (09/14/17 2007)  . milrinone 0.125 mcg/kg/min (09/14/17 2052)  . norepinephrine (LEVOPHED) Adult infusion      Assessment: 47 year old female admitted from [facility] 09/14/17 in cardiogenic shock and new atrial flutter on IV heparin.  Initial heparin level is undetectable after 4000 unit bolus and rate of 1200 units/hr. CBC was wnl. No bleeding noted. RN did state heparin was paused for line placement ~15min around 1900 PM.  Would still predict some heparin would be detected if this were affecting level but as undetectable, will rebolus and increase.   Goal of Therapy:  Heparin level 0.3-0.7 units/ml Monitor platelets by anticoagulation protocol: Yes   Plan:  Re-bolus Heparin 3000 units x1, then increase heparin to 1550 units/hr. Recheck Heparin level in 6 hours.  Daily Heparin level and CBC while on  therapy.   Sloan Leiter, PharmD, BCPS, BCCCP Clinical Pharmacist Clinical phone 09/14/2017 until 11PM [phone] After hours, please call #[phone] 09/14/2017,10:46 PM

## 2017-09-14 NOTE — ED Notes (Signed)
ED TO INPATIENT HANDOFF REPORT  Name/Age/Gender [name] 47 y.o. female  Code Status    Code Status Orders  (From admission, onward)        Start     Ordered   09/14/17 0428  Full code  Continuous     09/14/17 0430    Code Status History    Date Active Date Inactive Code Status Order ID Comments User Context   11/26/2014 2142 11/29/2014 1732 Full Code [id number]  Isaiah Serge, NP Inpatient      Home/SNF/Other Home  Chief Complaint pains all over multi disorders, chills, feverish, legs and hip pains cant sleep  Level of Care/Admitting Diagnosis ED Disposition    ED Disposition Condition [place]: [place] [[id number]]  Level of Care: ICU [6]  Diagnosis: Acute systolic heart failure ([place]) [428.21.ICD-9-CM]  Admitting Physician: Florencia Reasons [[id number]]  Attending Physician: Florencia Reasons [[id number]]  Estimated length of stay: 3 - 4 days  Certification:: I certify this patient will need inpatient services for at least 2 midnights  PT Class (Do Not Modify): Inpatient [101]  PT Acc Code (Do Not Modify): Private [1]       Medical History Past Medical History:  Diagnosis Date  . Acne   . AICD (automatic cardioverter/defibrillator) present   . Anemia 1980's   [id number]  . Anginal pain ([place])    sporatic , been going on for yrs,  . Anxiety   . Arthritis   . 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 Blood transfusion 1980's   1987 or 1988  . Breast calcifications    right breast  . Breast cancer (HCC)    right;  . 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 Cardiac MRI 05/2011 EF 32%. ICD implantation 10/2011 ([employer])  . 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 Conversion disorder   . 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 Depression   . Endometriosis   . Family history of colon cancer   . Family history of ovarian cancer   .  Fibromyalgia   . GERD (gastroesophageal reflux disease)   . Headache   . Hepatomegaly   . History of radiation therapy 05/09/17- 06/08/2017   Left Breast treated to 40.05 Gy in 15 fractions of 2.67 Gy, followed by a 10 gy boost in 5 fractions to yield a total dose of 50.05 Gy to the Left Breast  . History of stomach ulcers   . Hyperlipidemia   . Hypertension    c/b orthostatic hypotention  . ICD (implantable cardiac defibrillator) in place   . Insomnia   . Interstitial cystitis   . Left eye injury 12/2014  . 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   . Nonischemic cardiomyopathy ([place])    related to chemo; EF 30% 05/2011  . Obesity   . Palpitation    normal sinus rhythm only on 21 day heart monitor  . Panic attacks   . Peripheral neuropathy    chemo- induced  . Pneumonia 2000's   "once"  . Seasonal allergies   . Seizures ([place])    last seizure  last week: non elipsey seisure  . Type II diabetes mellitus (HCC)     Allergies Allergies  Allergen Reactions  . Ace Inhibitors Other (See Comments)    hyperkalemia  . Reglan [Metoclopramide Hcl] Anxiety  .  Adhesive [Tape] Itching and Rash    Specifically tegaderm, normal adhesive tape is ok    IV Location/Drains/Wounds Patient Lines/Drains/Airways Status   Active Line/Drains/Airways    Name:   Placement date:   Placement time:   Site:   Days:   Peripheral IV 09/27/2017 Left Hand   09/23/2017    2320    Hand   1   Peripheral IV 09/14/17 Left;Upper Arm   09/14/17    0111    Arm   less than 1   Incision (Closed) 02/21/17 Breast Left   02/21/17    1311     205          Labs/Imaging Results for orders placed or performed during the hospital encounter of 10/07/2017 (from the past 48 hour(s))  CBG monitoring, ED     Status: None   Collection Time: 09/30/2017  9:56 PM  Result Value Ref Range   Glucose-Capillary 84 65 - 99 mg/dL   Comment 1 Notify RN    Comment 2  Document in Chart   CBC with Differential     Status: Abnormal   Collection Time: 10/09/2017 11:12 PM  Result Value Ref Range   WBC 4.1 4.0 - 10.5 K/uL   RBC 4.26 3.87 - 5.11 MIL/uL   Hemoglobin 14.7 12.0 - 15.0 g/dL   HCT 42.6 36.0 - 46.0 %   MCV 100.0 78.0 - 100.0 fL   MCH 34.5 (H) 26.0 - 34.0 pg   MCHC 34.5 30.0 - 36.0 g/dL   RDW 21.0 (H) 11.5 - 15.5 %   Platelets 139 (L) 150 - 400 K/uL   Neutrophils Relative % 63 %   Neutro Abs 2.6 1.7 - 7.7 K/uL   Lymphocytes Relative 18 %   Lymphs Abs 0.7 0.7 - 4.0 K/uL   Monocytes Relative 19 %   Monocytes Absolute 0.8 0.1 - 1.0 K/uL   Eosinophils Relative 0 %   Eosinophils Absolute 0.0 0.0 - 0.7 K/uL   Basophils Relative 0 %   Basophils Absolute 0.0 0.0 - 0.1 K/uL    Comment: Performed at [facility], [place] [address], [place], [place] [address]  Ethanol     Status: None   Collection Time: 09/12/2017 11:12 PM  Result Value Ref Range   Alcohol, Ethyl (B) <10 <10 mg/dL    Comment:        LOWEST DETECTABLE LIMIT FOR SERUM ALCOHOL IS 10 mg/dL FOR MEDICAL PURPOSES ONLY Performed at [place] [address], [place], [place] [address]   Hepatic function panel     Status: Abnormal   Collection Time: 09/17/2017 11:12 PM  Result Value Ref Range   Total Protein 6.8 6.5 - 8.1 g/dL   Albumin 3.0 (L) 3.5 - 5.0 g/dL   AST 122 (H) 15 - 41 U/L   ALT 18 14 - 54 U/L   Alkaline Phosphatase 135 (H) 38 - 126 U/L   Total Bilirubin 11.5 (H) 0.3 - 1.2 mg/dL   Bilirubin, Direct 4.8 (H) 0.1 - 0.5 mg/dL   Indirect Bilirubin 6.7 (H) 0.3 - 0.9 mg/dL    Comment: Performed at [facility], [place] [address], [place], [place] [address]  Basic metabolic panel     Status: Abnormal   Collection Time: 09/23/2017 11:12 PM  Result Value Ref Range   Sodium 130 (L) 135 - 145 mmol/L   Potassium >7.5 (HH) 3.5 - 5.1 mmol/L    Comment: SLIGHT HEMOLYSIS ICTERUS AT  THIS LEVEL MAY AFFECT RESULT REPEATED TO  VERIFY CRITICAL RESULT CALLED TO, READ BACK BY AND VERIFIED WITHAmalia Greenhouse RN [id number] 09/14/17 A NAVARRO    Chloride 87 (L) 101 - 111 mmol/L   CO2 29 22 - 32 mmol/L   Glucose, Bld 74 65 - 99 mg/dL   BUN 61 (H) 6 - 20 mg/dL   Creatinine, Ser 3.01 (H) 0.44 - 1.00 mg/dL   Calcium 9.3 8.9 - 10.3 mg/dL   GFR calc non Af Amer 17 (L) >60 mL/min   GFR calc Af Amer 20 (L) >60 mL/min    Comment: (NOTE) The eGFR has been calculated using the CKD EPI equation. This calculation has not been validated in all clinical situations. eGFR's persistently <60 mL/min signify possible Chronic Kidney Disease.    Anion gap 14 5 - 15    Comment: Performed at [facility], [place] [address] [place], [place] [address]  Digoxin level     Status: Abnormal   Collection Time: 10/05/2017 11:12 PM  Result Value Ref Range   Digoxin Level <0.2 (L) 0.8 - 2.0 ng/mL    Comment: RESULTS CONFIRMED BY MANUAL DILUTION Performed at [place] [address] [place], [place] [address]   Acetaminophen level     Status: Abnormal   Collection Time: 09/22/2017 11:12 PM  Result Value Ref Range   Acetaminophen (Tylenol), Serum <10 (L) 10 - 30 ug/mL    Comment:        THERAPEUTIC CONCENTRATIONS VARY SIGNIFICANTLY. A RANGE OF 10-30 ug/mL MAY BE AN EFFECTIVE CONCENTRATION FOR MANY PATIENTS. HOWEVER, SOME ARE BEST TREATED AT CONCENTRATIONS OUTSIDE THIS RANGE. ACETAMINOPHEN CONCENTRATIONS >150 ug/mL AT 4 HOURS AFTER INGESTION AND >50 ug/mL AT 12 HOURS AFTER INGESTION ARE OFTEN ASSOCIATED WITH TOXIC REACTIONS. Performed at [facility], [place] [address], [place], [place] [address]   Salicylate level     Status: None   Collection Time: 09/15/2017 11:12 PM  Result Value Ref Range   Salicylate Lvl <2.7 2.8 - 30.0 mg/dL    Comment: Performed at [facility], [place] [address], [place], [place] [address]  Ammonia     Status: Abnormal   Collection Time:  09/14/17  1:13 AM  Result Value Ref Range   Ammonia 39 (H) 9 - 35 umol/L    Comment: Performed at [facility], [place] [address], [place], [place] [address]  Potassium     Status: Abnormal   Collection Time: 09/14/17  1:13 AM  Result Value Ref Range   Potassium 3.1 (L) 3.5 - 5.1 mmol/L    Comment: DELTA CHECK NOTED REPEATED TO VERIFY NO VISIBLE HEMOLYSIS Performed at [place] [address] [place], [place] [address]   Urinalysis, Routine w reflex microscopic     Status: Abnormal   Collection Time: 09/14/17  1:22 AM  Result Value Ref Range   Color, Urine AMBER (A) YELLOW    Comment: BIOCHEMICALS MAY BE AFFECTED BY COLOR   APPearance CLEAR CLEAR   Specific Gravity, Urine 1.013 1.005 - 1.030   pH 5.0 5.0 - 8.0   Glucose, UA NEGATIVE NEGATIVE mg/dL   Hgb urine dipstick NEGATIVE NEGATIVE   Bilirubin Urine SMALL (A) NEGATIVE   Ketones, ur NEGATIVE NEGATIVE mg/dL   Protein, ur NEGATIVE NEGATIVE mg/dL   Nitrite NEGATIVE NEGATIVE   Leukocytes, UA NEGATIVE NEGATIVE    Comment: Performed at [facility], [place] [address] [place], [place] [address]  Urine rapid drug screen (hosp performed)     Status: None   Collection Time: 09/14/17  1:22 AM  Result Value Ref Range   Opiates NONE DETECTED NONE DETECTED   Cocaine NONE DETECTED NONE DETECTED   Benzodiazepines NONE DETECTED NONE DETECTED   Amphetamines NONE DETECTED NONE DETECTED   Tetrahydrocannabinol NONE DETECTED NONE DETECTED   Barbiturates NONE DETECTED NONE DETECTED    Comment: (NOTE) DRUG SCREEN FOR MEDICAL PURPOSES ONLY.  IF CONFIRMATION IS NEEDED FOR ANY PURPOSE, NOTIFY LAB WITHIN 5 DAYS. LOWEST DETECTABLE LIMITS FOR URINE DRUG SCREEN Drug Class                     Cutoff (ng/mL) Amphetamine and metabolites    1000 Barbiturate and metabolites    200 Benzodiazepine                 659 Tricyclics and metabolites     300 Opiates and metabolites        300 Cocaine  and metabolites        300 THC                            50 Performed at [facility], [place] [address], [place], [place] [address]   I-Stat beta hCG blood, ED     Status: Abnormal   Collection Time: 09/14/17  1:25 AM  Result Value Ref Range   I-stat hCG, quantitative 15.0 (H) <5 mIU/mL   Comment 3            Comment:   GEST. AGE      CONC.  (mIU/mL)   <=1 WEEK        5 - 50     2 WEEKS       50 - 500     3 WEEKS       100 - 10,000     4 WEEKS     1,000 - 30,000        FEMALE AND NON-PREGNANT FEMALE:     LESS THAN 5 mIU/mL   Protime-INR     Status: None   Collection Time: 09/14/17  4:48 AM  Result Value Ref Range   Prothrombin Time 14.5 11.4 - 15.2 seconds   INR 1.14     Comment: Performed at [facility], [place] [address], [place], [place] [address]  Brain natriuretic peptide     Status: Abnormal   Collection Time: 09/14/17  4:48 AM  Result Value Ref Range   B Natriuretic Peptide 542.4 (H) 0.0 - 100.0 pg/mL    Comment: Performed at [facility], [place] [address], [place], [place] [address]  Comprehensive metabolic panel     Status: Abnormal   Collection Time: 09/14/17  4:48 AM  Result Value Ref Range   Sodium 135 135 - 145 mmol/L   Potassium 3.3 (L) 3.5 - 5.1 mmol/L   Chloride 90 (L) 101 - 111 mmol/L   CO2 28 22 - 32 mmol/L   Glucose, Bld 91 65 - 99 mg/dL   BUN 60 (H) 6 - 20 mg/dL   Creatinine, Ser 2.96 (H) 0.44 - 1.00 mg/dL   Calcium 9.5 8.9 - 10.3 mg/dL   Total Protein 7.3 6.5 - 8.1 g/dL   Albumin 3.0 (L) 3.5 - 5.0 g/dL   AST 49 (H) 15 - 41 U/L   ALT 31 14 - 54 U/L  Alkaline Phosphatase 129 (H) 38 - 126 U/L   Total Bilirubin 9.9 (H) 0.3 - 1.2 mg/dL   GFR calc non Af Amer 18 (L) >60 mL/min   GFR calc Af Amer 21 (L) >60 mL/min    Comment: (NOTE) The eGFR has been calculated using the CKD EPI equation. This calculation has not been validated in all clinical situations. eGFR's persistently <60 mL/min signify  possible Chronic Kidney Disease.    Anion gap 17 (H) 5 - 15    Comment: Performed at [facility], [place] [address], [place], [place] [address]  Lactic acid, plasma     Status: None   Collection Time: 09/14/17  4:48 AM  Result Value Ref Range   Lactic Acid, Venous 1.8 0.5 - 1.9 mmol/L    Comment: Performed at [facility], [place] [address], [place], [place] [address]  Blood gas, arterial     Status: Abnormal   Collection Time: 09/14/17  4:48 AM  Result Value Ref Range   FIO2 21.00    pH, Arterial 7.544 (H) 7.350 - 7.450   pCO2 arterial 28.2 (L) 32.0 - 48.0 mmHg   pO2, Arterial 105 83.0 - 108.0 mmHg   Bicarbonate 24.3 20.0 - 28.0 mmol/L   Acid-Base Excess 3.0 (H) 0.0 - 2.0 mmol/L   O2 Saturation 98.2 %   Patient temperature 98.6    Collection site LEFT RADIAL    Drawn by [id number]    Sample type ARTERIAL    Allens test (pass/fail) PASS PASS    Comment: Performed at [facility], [place] [address] [place], [place] [address]  HIV antibody (Routine Testing)     Status: None   Collection Time: 09/14/17  4:48 AM  Result Value Ref Range   HIV Screen 4th Generation wRfx Non Reactive Non Reactive    Comment: (NOTE) Performed At: [facility] [place], [place] [id number] Rush Farmer MD [id number] Performed at [facility], [place] [address] [place], [place] [address]   I-Stat CG4 Lactic Acid, ED     Status: Abnormal   Collection Time: 09/14/17  5:09 AM  Result Value Ref Range   Lactic Acid, Venous 1.91 (H) 0.5 - 1.9 mmol/L   Ct Abdomen Pelvis Wo Contrast  Result Date: 09/14/2017 CLINICAL DATA:  Abdominal distension and weakness. EXAM: CT ABDOMEN AND PELVIS WITHOUT CONTRAST TECHNIQUE: Multidetector CT imaging of the abdomen and pelvis was performed following the standard protocol without IV contrast. COMPARISON:  CT abdomen pelvis 08/03/2017 FINDINGS: LOWER CHEST: Severe cardiomegaly.  HEPATOBILIARY: There is a patent steatosis. Hypoattenuating focus in the right hepatic lobe, likely a hepatic cyst. Status post cholecystectomy. PANCREAS: Normal parenchymal contours without ductal dilatation. No peripancreatic fluid collection. SPLEEN: Normal. ADRENALS/URINARY TRACT: --Adrenal glands: Normal. --Right kidney/ureter: No hydronephrosis, nephroureterolithiasis, perinephric stranding or solid renal mass. --Left kidney/ureter: No hydronephrosis, nephroureterolithiasis, perinephric stranding or solid renal mass. --Urinary bladder: Normal for degree of distention STOMACH/BOWEL: --Stomach/Duodenum: No hiatal hernia or other gastric abnormality. Normal duodenal course. --Small bowel: No dilatation or inflammation. --Colon: No focal abnormality. --Appendix: Not visualized. No right lower quadrant inflammation or free fluid. VASCULAR/LYMPHATIC: Normal course and caliber of the major abdominal vessels. No abdominal or pelvic lymphadenopathy. REPRODUCTIVE: Status post hysterectomy. No adnexal mass. MUSCULOSKELETAL. No bony spinal canal stenosis or focal osseous abnormality. OTHER: None. IMPRESSION: Severe cardiomegaly and a pack steatosis. No acute abdominal or pelvic abnormality. Electronically Signed   By: Ulyses Jarred M.D.   On: 09/14/2017  03:10   US Renal  Result Date: 09/14/2017 CLINICAL DATA:  Elevated creatinine EXAM: RENAL / URINARY TRACT ULTRASOUND COMPLETE COMPARISON:  None. FINDINGS: Right Kidney: Length: 9.6 cm. Echogenicity within normal limits. No mass or hydronephrosis visualized. Left Kidney: Length: 10.1 cm. Echogenicity within normal limits. No mass or hydronephrosis visualized. Bladder: Appears normal for degree of bladder distention. IMPRESSION: No acute abnormality noted. Electronically Signed   By: Inez Catalina M.D.   On: 09/14/2017 11:44   US Abdomen Limited  Result Date: 09/14/2017 CLINICAL DATA:  Diabetes.  Obesity.  Enlarged liver. EXAM: ULTRASOUND ABDOMEN LIMITED RIGHT UPPER  QUADRANT COMPARISON:  CT earlier same day. FINDINGS: Gallbladder: Surgically absent. Common bile duct: Diameter: 6 mm common normal Liver: Enlarged liver showing increased echogenicity consistent with diffuse steatosis. Portal vein is patent on color Doppler imaging with normal direction of blood flow towards the liver. IMPRESSION: Enlarged liver with increased echogenicity consistent with diffuse steatosis. Electronically Signed   By: Nelson Chimes M.D.   On: 09/14/2017 11:57   Dg Chest Port 1 View  Result Date: 09/14/2017 CLINICAL DATA:  Congestive heart failure EXAM: PORTABLE CHEST 1 VIEW COMPARISON:  11/26/2014 FINDINGS: Progression of marked cardiac enlargement. Bibasilar atelectasis. Possible small left effusion. Negative for pulmonary edema. IMPRESSION: Marked cardiac enlargement.  Negative for edema. Bibasilar atelectasis left greater than right Electronically Signed   By: Franchot Gallo M.D.   On: 09/14/2017 10:43    Pending Labs Unresulted Labs (From admission, onward)   Start     Ordered   09/14/17 2000  Heparin level (unfractionated)  Once-Timed,   R     09/14/17 1041   09/14/17 1406  Haptoglobin  Once,   R     09/14/17 1405   09/14/17 0353  Lactic acid, plasma  STAT Now then every 3 hours,   R     09/14/17 0353      Vitals/Pain Today's Vitals   09/14/17 0830 09/14/17 0900 09/14/17 0945 09/14/17 1030  BP: (!) 70/49 (!) 79/59  109/60  Pulse: 77 87  86  Resp: 18 (!) 23 (!) 22   Temp:      TempSrc:      SpO2: 96% 93%  96%  Weight:      PainSc:        Isolation Precautions No active isolations  Medications Medications  allopurinol (ZYLOPRIM) tablet 300 mg (300 mg Oral Given 09/14/17 1131)  venlafaxine XR (EFFEXOR-XR) 24 hr capsule 37.5 mg (37.5 mg Oral Given 09/14/17 0849)  traZODone (DESYREL) tablet 50 mg (has no administration in time range)  oxyCODONE (Oxy IR/ROXICODONE) immediate release tablet 10 mg (has no administration in time range)  olopatadine (PATANOL) 0.1 %  ophthalmic solution 1 drop (1 drop Both Eyes Given 09/14/17 1132)  letrozole [facility]) tablet 2.5 mg (2.5 mg Oral Given 09/14/17 1133)  loratadine (CLARITIN) tablet 10 mg (10 mg Oral Given 09/14/17 1133)  gabapentin (NEURONTIN) capsule 200 mg (has no administration in time range)  ferrous sulfate tablet 325 mg (325 mg Oral Given 09/14/17 0849)  DULoxetine (CYMBALTA) DR capsule 30 mg (30 mg Oral Given 09/14/17 1133)  divalproex (DEPAKOTE ER) 24 hr tablet 750 mg (750 mg Oral Given 09/14/17 1133)  pantoprazole (PROTONIX) EC tablet 80 mg (80 mg Oral Given 09/14/17 1133)  multivitamin with minerals tablet 1 tablet (1 tablet Oral Given 09/14/17 1132)  dicyclomine (BENTYL) tablet 20 mg (20 mg Oral Given 09/14/17 1252)  cycloSPORINE (RESTASIS) 0.05 % ophthalmic emulsion 1 drop (1 drop Both  Eyes Given 09/14/17 1131)  acetaminophen (TYLENOL) tablet 650 mg (has no administration in time range)    Or  acetaminophen (TYLENOL) suppository 650 mg (has no administration in time range)  ondansetron (ZOFRAN) tablet 4 mg (has no administration in time range)    Or  ondansetron (ZOFRAN) injection 4 mg (has no administration in time range)  albuterol (PROVENTIL) (2.5 MG/3ML) 0.083% nebulizer solution 2.5 mg (has no administration in time range)  perflutren lipid microspheres (DEFINITY) IV suspension (1 mL Intravenous Not Given 09/14/17 1412)  heparin ADULT infusion 100 units/mL (25000 units/249m sodium chloride 0.45%) (1,200 Units/hr Intravenous New Bag/Given 09/14/17 1142)  furosemide (LASIX) 120 mg in dextrose 5 % 50 mL IVPB (0 mg Intravenous Stopped 09/14/17 1406)  potassium chloride Roy (K-DUR,KLOR-CON) CR tablet 40 mEq (40 mEq Oral Refused 09/14/17 1247)  sodium chloride 0.9 % bolus 500 mL (0 mLs Intravenous Stopped 09/14/17 0112)  iopamidol (ISOVUE-300) 61 % injection 30 mL ( Oral Canceled Entry 09/14/17 0247)  heparin bolus via infusion 4,000 Units (4,000 Units Intravenous Bolus from Bag 09/14/17 1143)    Mobility walks with person  assist

## 2017-09-14 NOTE — Consult Note (Addendum)
Active member of club or organization: Not on file    Attends meetings of clubs or organizations: Not on file    Relationship status: Not on file  Other Topics Concern  . Not on file  Social History Narrative   Patient is married [name]) and lives at home with her family.   Patient has one child. A daughter   Patient is right-handed.   Patient has a college education.   Patient drinks some caffeine occasionally, but not everyday.   Regular exercise   She is disabled   02/23/2016   Updated    Allergies:  Allergies  Allergen Reactions  . Ace Inhibitors Other (See Comments)    hyperkalemia  . Reglan [Metoclopramide Hcl] Anxiety  . Adhesive [Tape] Itching and Rash    Specifically tegaderm, normal adhesive tape is ok    Objective:    Vital Signs:   Temp:  [98.1 F (36.7 C)-98.2  F (36.8 C)] 98.2 F (36.8 C) (04/02 2044) Pulse Rate:  [36-100] 81 (04/03 1915) Resp:  [16-25] 22 (04/03 1915) BP: (60-131)/(46-107) 82/60 (04/03 1915) SpO2:  [92 %-100 %] 96 % (04/03 1915) Weight:  [120.7 kg (266 lb 1.5 oz)-122.6 kg (270 lb 3 oz)] 120.7 kg (266 lb 1.5 oz) (04/03 1700) Last BM Date: 09/14/17  Weight change: Filed Weights   09/14/17 0358 09/14/17 1700  Weight: 122.6 kg (270 lb 3 oz) 120.7 kg (266 lb 1.5 oz)    Intake/Output:   Intake/Output Summary (Last 24 hours) at 09/14/2017 1950 Last data filed at 09/14/2017 1700 Gross per 24 hour  Intake 743.01 ml  Output -  Net 743.01 ml      Physical Exam    General:  Ill-appearing. Weak  No resp difficulty HEENT: normal scleral icterus Neck: supple. JVP to ear. Carotids 2+ bilat; no bruits. No lymphadenopathy or thyromegaly appreciated. Cor: PMI laterally displaced. IRR +s3 and RV lift Lungs: clear Abdomen: obese. soft, mildly tender, nondistended. Liver edge down No bruits or masses. Good bowel sounds. Extremities: no cyanosis, clubbing, rash, 2+ edema cool. Cast on RLE.  Neuro: alert & orientedx3, cranial nerves grossly intact. moves all 4 extremities w/o difficulty. Affect pleasant   Telemetry   AFL 90s Personally reviewed  EKG    AFL 86 No ST-T wave abnormalities. Personally reviewed   Labs   Basic Metabolic Panel: Recent Labs  Lab 10/09/2017 2312 09/14/17 0113 09/14/17 0448  NA 130*  --  135  K >7.5* 3.1* 3.3*  CL 87*  --  90*  CO2 29  --  28  GLUCOSE 74  --  91  BUN 61*  --  60*  CREATININE 3.01*  --  2.96*  CALCIUM 9.3  --  9.5    Liver Function Tests: Recent Labs  Lab 10/06/2017 2312 09/14/17 0448  AST 122* 49*  ALT 18 31  ALKPHOS 135* 129*  BILITOT 11.5* 9.9*  PROT 6.8 7.3  ALBUMIN 3.0* 3.0*   No results for input(s): LIPASE, AMYLASE in the last 168 hours. Recent Labs  Lab 09/14/17 0113  AMMONIA 39*    CBC: Recent Labs  Lab 09/24/2017 2312  WBC 4.1  NEUTROABS 2.6  HGB  14.7  HCT 42.6  MCV 100.0  PLT 139*    Cardiac Enzymes: No results for input(s): CKTOTAL, CKMB, CKMBINDEX, TROPONINI in the last 168 hours.  BNP: BNP (last 3 results) Recent Labs    07/29/17 1148 09/14/17 0448  BNP 724.5* 542.4*    ProBNP (last 3  results) No results for input(s): PROBNP in the last 8760 hours.   CBG: Recent Labs  Lab 10/05/2017 2156  GLUCAP 84    Coagulation Studies: Recent Labs    09/14/17 0448  LABPROT 14.5  INR 1.14     Imaging   Ct Abdomen Pelvis Wo Contrast  Result Date: 09/14/2017 CLINICAL DATA:  Abdominal distension and weakness. EXAM: CT ABDOMEN AND PELVIS WITHOUT CONTRAST TECHNIQUE: Multidetector CT imaging of the abdomen and pelvis was performed following the standard protocol without IV contrast. COMPARISON:  CT abdomen pelvis 08/03/2017 FINDINGS: LOWER CHEST: Severe cardiomegaly. HEPATOBILIARY: There is a patent steatosis. Hypoattenuating focus in the right hepatic lobe, likely a hepatic cyst. Status post cholecystectomy. PANCREAS: Normal parenchymal contours without ductal dilatation. No peripancreatic fluid collection. SPLEEN: Normal. ADRENALS/URINARY TRACT: --Adrenal glands: Normal. --Right kidney/ureter: No hydronephrosis, nephroureterolithiasis, perinephric stranding or solid renal mass. --Left kidney/ureter: No hydronephrosis, nephroureterolithiasis, perinephric stranding or solid renal mass. --Urinary bladder: Normal for degree of distention STOMACH/BOWEL: --Stomach/Duodenum: No hiatal hernia or other gastric abnormality. Normal duodenal course. --Small bowel: No dilatation or inflammation. --Colon: No focal abnormality. --Appendix: Not visualized. No right lower quadrant inflammation or free fluid. VASCULAR/LYMPHATIC: Normal course and caliber of the major abdominal vessels. No abdominal or pelvic lymphadenopathy. REPRODUCTIVE: Status post hysterectomy. No adnexal mass. MUSCULOSKELETAL. No bony spinal canal stenosis or focal osseous  abnormality. OTHER: None. IMPRESSION: Severe cardiomegaly and a pack steatosis. No acute abdominal or pelvic abnormality. Electronically Signed   By: Ulyses Jarred M.D.   On: 09/14/2017 03:10   US Renal  Result Date: 09/14/2017 CLINICAL DATA:  Elevated creatinine EXAM: RENAL / URINARY TRACT ULTRASOUND COMPLETE COMPARISON:  None. FINDINGS: Right Kidney: Length: 9.6 cm. Echogenicity within normal limits. No mass or hydronephrosis visualized. Left Kidney: Length: 10.1 cm. Echogenicity within normal limits. No mass or hydronephrosis visualized. Bladder: Appears normal for degree of bladder distention. IMPRESSION: No acute abnormality noted. Electronically Signed   By: Inez Catalina M.D.   On: 09/14/2017 11:44   US Abdomen Limited  Result Date: 09/14/2017 CLINICAL DATA:  Diabetes.  Obesity.  Enlarged liver. EXAM: ULTRASOUND ABDOMEN LIMITED RIGHT UPPER QUADRANT COMPARISON:  CT earlier same day. FINDINGS: Gallbladder: Surgically absent. Common bile duct: Diameter: 6 mm common normal Liver: Enlarged liver showing increased echogenicity consistent with diffuse steatosis. Portal vein is patent on color Doppler imaging with normal direction of blood flow towards the liver. IMPRESSION: Enlarged liver with increased echogenicity consistent with diffuse steatosis. Electronically Signed   By: Nelson Chimes M.D.   On: 09/14/2017 11:57   Dg Chest Port 1 View  Result Date: 09/14/2017 CLINICAL DATA:  Central line placement. EXAM: PORTABLE CHEST 1 VIEW COMPARISON:  Chest radiograph September 14, 2017 at 1020 hours FINDINGS: Interval placement of RIGHT internal jugular central venous catheter with distal tip projecting cavoatrial junction. Stable severe cardiomegaly and pulmonary vascular congestion. No pleural effusion or focal consolidation. No pneumothorax. Single lead RIGHT AICD. Surgical clips project in bilateral chest wall. IMPRESSION: RIGHT internal jugular central venous catheter distal tip projects at cavoatrial junction. No  pneumothorax. Stable cardiomegaly and pulmonary vascular congestion. Electronically Signed   By: Elon Alas M.D.   On: 09/14/2017 19:47   Dg Chest Port 1 View  Result Date: 09/14/2017 CLINICAL DATA:  Congestive heart failure EXAM: PORTABLE CHEST 1 VIEW COMPARISON:  11/26/2014 FINDINGS: Progression of marked cardiac enlargement. Bibasilar atelectasis. Possible small left effusion. Negative for pulmonary edema. IMPRESSION: Marked cardiac enlargement.  Negative for edema. Bibasilar atelectasis left greater than right  Active member of club or organization: Not on file    Attends meetings of clubs or organizations: Not on file    Relationship status: Not on file  Other Topics Concern  . Not on file  Social History Narrative   Patient is married [name]) and lives at home with her family.   Patient has one child. A daughter   Patient is right-handed.   Patient has a college education.   Patient drinks some caffeine occasionally, but not everyday.   Regular exercise   She is disabled   02/23/2016   Updated    Allergies:  Allergies  Allergen Reactions  . Ace Inhibitors Other (See Comments)    hyperkalemia  . Reglan [Metoclopramide Hcl] Anxiety  . Adhesive [Tape] Itching and Rash    Specifically tegaderm, normal adhesive tape is ok    Objective:    Vital Signs:   Temp:  [98.1 F (36.7 C)-98.2  F (36.8 C)] 98.2 F (36.8 C) (04/02 2044) Pulse Rate:  [36-100] 81 (04/03 1915) Resp:  [16-25] 22 (04/03 1915) BP: (60-131)/(46-107) 82/60 (04/03 1915) SpO2:  [92 %-100 %] 96 % (04/03 1915) Weight:  [120.7 kg (266 lb 1.5 oz)-122.6 kg (270 lb 3 oz)] 120.7 kg (266 lb 1.5 oz) (04/03 1700) Last BM Date: 09/14/17  Weight change: Filed Weights   09/14/17 0358 09/14/17 1700  Weight: 122.6 kg (270 lb 3 oz) 120.7 kg (266 lb 1.5 oz)    Intake/Output:   Intake/Output Summary (Last 24 hours) at 09/14/2017 1950 Last data filed at 09/14/2017 1700 Gross per 24 hour  Intake 743.01 ml  Output -  Net 743.01 ml      Physical Exam    General:  Ill-appearing. Weak  No resp difficulty HEENT: normal scleral icterus Neck: supple. JVP to ear. Carotids 2+ bilat; no bruits. No lymphadenopathy or thyromegaly appreciated. Cor: PMI laterally displaced. IRR +s3 and RV lift Lungs: clear Abdomen: obese. soft, mildly tender, nondistended. Liver edge down No bruits or masses. Good bowel sounds. Extremities: no cyanosis, clubbing, rash, 2+ edema cool. Cast on RLE.  Neuro: alert & orientedx3, cranial nerves grossly intact. moves all 4 extremities w/o difficulty. Affect pleasant   Telemetry   AFL 90s Personally reviewed  EKG    AFL 86 No ST-T wave abnormalities. Personally reviewed   Labs   Basic Metabolic Panel: Recent Labs  Lab 10/09/2017 2312 09/14/17 0113 09/14/17 0448  NA 130*  --  135  K >7.5* 3.1* 3.3*  CL 87*  --  90*  CO2 29  --  28  GLUCOSE 74  --  91  BUN 61*  --  60*  CREATININE 3.01*  --  2.96*  CALCIUM 9.3  --  9.5    Liver Function Tests: Recent Labs  Lab 10/06/2017 2312 09/14/17 0448  AST 122* 49*  ALT 18 31  ALKPHOS 135* 129*  BILITOT 11.5* 9.9*  PROT 6.8 7.3  ALBUMIN 3.0* 3.0*   No results for input(s): LIPASE, AMYLASE in the last 168 hours. Recent Labs  Lab 09/14/17 0113  AMMONIA 39*    CBC: Recent Labs  Lab 09/24/2017 2312  WBC 4.1  NEUTROABS 2.6  HGB  14.7  HCT 42.6  MCV 100.0  PLT 139*    Cardiac Enzymes: No results for input(s): CKTOTAL, CKMB, CKMBINDEX, TROPONINI in the last 168 hours.  BNP: BNP (last 3 results) Recent Labs    07/29/17 1148 09/14/17 0448  BNP 724.5* 542.4*    ProBNP (last 3  results) No results for input(s): PROBNP in the last 8760 hours.   CBG: Recent Labs  Lab 10/05/2017 2156  GLUCAP 84    Coagulation Studies: Recent Labs    09/14/17 0448  LABPROT 14.5  INR 1.14     Imaging   Ct Abdomen Pelvis Wo Contrast  Result Date: 09/14/2017 CLINICAL DATA:  Abdominal distension and weakness. EXAM: CT ABDOMEN AND PELVIS WITHOUT CONTRAST TECHNIQUE: Multidetector CT imaging of the abdomen and pelvis was performed following the standard protocol without IV contrast. COMPARISON:  CT abdomen pelvis 08/03/2017 FINDINGS: LOWER CHEST: Severe cardiomegaly. HEPATOBILIARY: There is a patent steatosis. Hypoattenuating focus in the right hepatic lobe, likely a hepatic cyst. Status post cholecystectomy. PANCREAS: Normal parenchymal contours without ductal dilatation. No peripancreatic fluid collection. SPLEEN: Normal. ADRENALS/URINARY TRACT: --Adrenal glands: Normal. --Right kidney/ureter: No hydronephrosis, nephroureterolithiasis, perinephric stranding or solid renal mass. --Left kidney/ureter: No hydronephrosis, nephroureterolithiasis, perinephric stranding or solid renal mass. --Urinary bladder: Normal for degree of distention STOMACH/BOWEL: --Stomach/Duodenum: No hiatal hernia or other gastric abnormality. Normal duodenal course. --Small bowel: No dilatation or inflammation. --Colon: No focal abnormality. --Appendix: Not visualized. No right lower quadrant inflammation or free fluid. VASCULAR/LYMPHATIC: Normal course and caliber of the major abdominal vessels. No abdominal or pelvic lymphadenopathy. REPRODUCTIVE: Status post hysterectomy. No adnexal mass. MUSCULOSKELETAL. No bony spinal canal stenosis or focal osseous  abnormality. OTHER: None. IMPRESSION: Severe cardiomegaly and a pack steatosis. No acute abdominal or pelvic abnormality. Electronically Signed   By: Ulyses Jarred M.D.   On: 09/14/2017 03:10   US Renal  Result Date: 09/14/2017 CLINICAL DATA:  Elevated creatinine EXAM: RENAL / URINARY TRACT ULTRASOUND COMPLETE COMPARISON:  None. FINDINGS: Right Kidney: Length: 9.6 cm. Echogenicity within normal limits. No mass or hydronephrosis visualized. Left Kidney: Length: 10.1 cm. Echogenicity within normal limits. No mass or hydronephrosis visualized. Bladder: Appears normal for degree of bladder distention. IMPRESSION: No acute abnormality noted. Electronically Signed   By: Inez Catalina M.D.   On: 09/14/2017 11:44   US Abdomen Limited  Result Date: 09/14/2017 CLINICAL DATA:  Diabetes.  Obesity.  Enlarged liver. EXAM: ULTRASOUND ABDOMEN LIMITED RIGHT UPPER QUADRANT COMPARISON:  CT earlier same day. FINDINGS: Gallbladder: Surgically absent. Common bile duct: Diameter: 6 mm common normal Liver: Enlarged liver showing increased echogenicity consistent with diffuse steatosis. Portal vein is patent on color Doppler imaging with normal direction of blood flow towards the liver. IMPRESSION: Enlarged liver with increased echogenicity consistent with diffuse steatosis. Electronically Signed   By: Nelson Chimes M.D.   On: 09/14/2017 11:57   Dg Chest Port 1 View  Result Date: 09/14/2017 CLINICAL DATA:  Central line placement. EXAM: PORTABLE CHEST 1 VIEW COMPARISON:  Chest radiograph September 14, 2017 at 1020 hours FINDINGS: Interval placement of RIGHT internal jugular central venous catheter with distal tip projecting cavoatrial junction. Stable severe cardiomegaly and pulmonary vascular congestion. No pleural effusion or focal consolidation. No pneumothorax. Single lead RIGHT AICD. Surgical clips project in bilateral chest wall. IMPRESSION: RIGHT internal jugular central venous catheter distal tip projects at cavoatrial junction. No  pneumothorax. Stable cardiomegaly and pulmonary vascular congestion. Electronically Signed   By: Elon Alas M.D.   On: 09/14/2017 19:47   Dg Chest Port 1 View  Result Date: 09/14/2017 CLINICAL DATA:  Congestive heart failure EXAM: PORTABLE CHEST 1 VIEW COMPARISON:  11/26/2014 FINDINGS: Progression of marked cardiac enlargement. Bibasilar atelectasis. Possible small left effusion. Negative for pulmonary edema. IMPRESSION: Marked cardiac enlargement.  Negative for edema. Bibasilar atelectasis left greater than right  Active member of club or organization: Not on file    Attends meetings of clubs or organizations: Not on file    Relationship status: Not on file  Other Topics Concern  . Not on file  Social History Narrative   Patient is married [name]) and lives at home with her family.   Patient has one child. A daughter   Patient is right-handed.   Patient has a college education.   Patient drinks some caffeine occasionally, but not everyday.   Regular exercise   She is disabled   02/23/2016   Updated    Allergies:  Allergies  Allergen Reactions  . Ace Inhibitors Other (See Comments)    hyperkalemia  . Reglan [Metoclopramide Hcl] Anxiety  . Adhesive [Tape] Itching and Rash    Specifically tegaderm, normal adhesive tape is ok    Objective:    Vital Signs:   Temp:  [98.1 F (36.7 C)-98.2  F (36.8 C)] 98.2 F (36.8 C) (04/02 2044) Pulse Rate:  [36-100] 81 (04/03 1915) Resp:  [16-25] 22 (04/03 1915) BP: (60-131)/(46-107) 82/60 (04/03 1915) SpO2:  [92 %-100 %] 96 % (04/03 1915) Weight:  [120.7 kg (266 lb 1.5 oz)-122.6 kg (270 lb 3 oz)] 120.7 kg (266 lb 1.5 oz) (04/03 1700) Last BM Date: 09/14/17  Weight change: Filed Weights   09/14/17 0358 09/14/17 1700  Weight: 122.6 kg (270 lb 3 oz) 120.7 kg (266 lb 1.5 oz)    Intake/Output:   Intake/Output Summary (Last 24 hours) at 09/14/2017 1950 Last data filed at 09/14/2017 1700 Gross per 24 hour  Intake 743.01 ml  Output -  Net 743.01 ml      Physical Exam    General:  Ill-appearing. Weak  No resp difficulty HEENT: normal scleral icterus Neck: supple. JVP to ear. Carotids 2+ bilat; no bruits. No lymphadenopathy or thyromegaly appreciated. Cor: PMI laterally displaced. IRR +s3 and RV lift Lungs: clear Abdomen: obese. soft, mildly tender, nondistended. Liver edge down No bruits or masses. Good bowel sounds. Extremities: no cyanosis, clubbing, rash, 2+ edema cool. Cast on RLE.  Neuro: alert & orientedx3, cranial nerves grossly intact. moves all 4 extremities w/o difficulty. Affect pleasant   Telemetry   AFL 90s Personally reviewed  EKG    AFL 86 No ST-T wave abnormalities. Personally reviewed   Labs   Basic Metabolic Panel: Recent Labs  Lab 10/09/2017 2312 09/14/17 0113 09/14/17 0448  NA 130*  --  135  K >7.5* 3.1* 3.3*  CL 87*  --  90*  CO2 29  --  28  GLUCOSE 74  --  91  BUN 61*  --  60*  CREATININE 3.01*  --  2.96*  CALCIUM 9.3  --  9.5    Liver Function Tests: Recent Labs  Lab 10/06/2017 2312 09/14/17 0448  AST 122* 49*  ALT 18 31  ALKPHOS 135* 129*  BILITOT 11.5* 9.9*  PROT 6.8 7.3  ALBUMIN 3.0* 3.0*   No results for input(s): LIPASE, AMYLASE in the last 168 hours. Recent Labs  Lab 09/14/17 0113  AMMONIA 39*    CBC: Recent Labs  Lab 09/24/2017 2312  WBC 4.1  NEUTROABS 2.6  HGB  14.7  HCT 42.6  MCV 100.0  PLT 139*    Cardiac Enzymes: No results for input(s): CKTOTAL, CKMB, CKMBINDEX, TROPONINI in the last 168 hours.  BNP: BNP (last 3 results) Recent Labs    07/29/17 1148 09/14/17 0448  BNP 724.5* 542.4*    ProBNP (last 3  results) No results for input(s): PROBNP in the last 8760 hours.   CBG: Recent Labs  Lab 10/05/2017 2156  GLUCAP 84    Coagulation Studies: Recent Labs    09/14/17 0448  LABPROT 14.5  INR 1.14     Imaging   Ct Abdomen Pelvis Wo Contrast  Result Date: 09/14/2017 CLINICAL DATA:  Abdominal distension and weakness. EXAM: CT ABDOMEN AND PELVIS WITHOUT CONTRAST TECHNIQUE: Multidetector CT imaging of the abdomen and pelvis was performed following the standard protocol without IV contrast. COMPARISON:  CT abdomen pelvis 08/03/2017 FINDINGS: LOWER CHEST: Severe cardiomegaly. HEPATOBILIARY: There is a patent steatosis. Hypoattenuating focus in the right hepatic lobe, likely a hepatic cyst. Status post cholecystectomy. PANCREAS: Normal parenchymal contours without ductal dilatation. No peripancreatic fluid collection. SPLEEN: Normal. ADRENALS/URINARY TRACT: --Adrenal glands: Normal. --Right kidney/ureter: No hydronephrosis, nephroureterolithiasis, perinephric stranding or solid renal mass. --Left kidney/ureter: No hydronephrosis, nephroureterolithiasis, perinephric stranding or solid renal mass. --Urinary bladder: Normal for degree of distention STOMACH/BOWEL: --Stomach/Duodenum: No hiatal hernia or other gastric abnormality. Normal duodenal course. --Small bowel: No dilatation or inflammation. --Colon: No focal abnormality. --Appendix: Not visualized. No right lower quadrant inflammation or free fluid. VASCULAR/LYMPHATIC: Normal course and caliber of the major abdominal vessels. No abdominal or pelvic lymphadenopathy. REPRODUCTIVE: Status post hysterectomy. No adnexal mass. MUSCULOSKELETAL. No bony spinal canal stenosis or focal osseous  abnormality. OTHER: None. IMPRESSION: Severe cardiomegaly and a pack steatosis. No acute abdominal or pelvic abnormality. Electronically Signed   By: Ulyses Jarred M.D.   On: 09/14/2017 03:10   US Renal  Result Date: 09/14/2017 CLINICAL DATA:  Elevated creatinine EXAM: RENAL / URINARY TRACT ULTRASOUND COMPLETE COMPARISON:  None. FINDINGS: Right Kidney: Length: 9.6 cm. Echogenicity within normal limits. No mass or hydronephrosis visualized. Left Kidney: Length: 10.1 cm. Echogenicity within normal limits. No mass or hydronephrosis visualized. Bladder: Appears normal for degree of bladder distention. IMPRESSION: No acute abnormality noted. Electronically Signed   By: Inez Catalina M.D.   On: 09/14/2017 11:44   US Abdomen Limited  Result Date: 09/14/2017 CLINICAL DATA:  Diabetes.  Obesity.  Enlarged liver. EXAM: ULTRASOUND ABDOMEN LIMITED RIGHT UPPER QUADRANT COMPARISON:  CT earlier same day. FINDINGS: Gallbladder: Surgically absent. Common bile duct: Diameter: 6 mm common normal Liver: Enlarged liver showing increased echogenicity consistent with diffuse steatosis. Portal vein is patent on color Doppler imaging with normal direction of blood flow towards the liver. IMPRESSION: Enlarged liver with increased echogenicity consistent with diffuse steatosis. Electronically Signed   By: Nelson Chimes M.D.   On: 09/14/2017 11:57   Dg Chest Port 1 View  Result Date: 09/14/2017 CLINICAL DATA:  Central line placement. EXAM: PORTABLE CHEST 1 VIEW COMPARISON:  Chest radiograph September 14, 2017 at 1020 hours FINDINGS: Interval placement of RIGHT internal jugular central venous catheter with distal tip projecting cavoatrial junction. Stable severe cardiomegaly and pulmonary vascular congestion. No pleural effusion or focal consolidation. No pneumothorax. Single lead RIGHT AICD. Surgical clips project in bilateral chest wall. IMPRESSION: RIGHT internal jugular central venous catheter distal tip projects at cavoatrial junction. No  pneumothorax. Stable cardiomegaly and pulmonary vascular congestion. Electronically Signed   By: Elon Alas M.D.   On: 09/14/2017 19:47   Dg Chest Port 1 View  Result Date: 09/14/2017 CLINICAL DATA:  Congestive heart failure EXAM: PORTABLE CHEST 1 VIEW COMPARISON:  11/26/2014 FINDINGS: Progression of marked cardiac enlargement. Bibasilar atelectasis. Possible small left effusion. Negative for pulmonary edema. IMPRESSION: Marked cardiac enlargement.  Negative for edema. Bibasilar atelectasis left greater than right  Active member of club or organization: Not on file    Attends meetings of clubs or organizations: Not on file    Relationship status: Not on file  Other Topics Concern  . Not on file  Social History Narrative   Patient is married [name]) and lives at home with her family.   Patient has one child. A daughter   Patient is right-handed.   Patient has a college education.   Patient drinks some caffeine occasionally, but not everyday.   Regular exercise   She is disabled   02/23/2016   Updated    Allergies:  Allergies  Allergen Reactions  . Ace Inhibitors Other (See Comments)    hyperkalemia  . Reglan [Metoclopramide Hcl] Anxiety  . Adhesive [Tape] Itching and Rash    Specifically tegaderm, normal adhesive tape is ok    Objective:    Vital Signs:   Temp:  [98.1 F (36.7 C)-98.2  F (36.8 C)] 98.2 F (36.8 C) (04/02 2044) Pulse Rate:  [36-100] 81 (04/03 1915) Resp:  [16-25] 22 (04/03 1915) BP: (60-131)/(46-107) 82/60 (04/03 1915) SpO2:  [92 %-100 %] 96 % (04/03 1915) Weight:  [120.7 kg (266 lb 1.5 oz)-122.6 kg (270 lb 3 oz)] 120.7 kg (266 lb 1.5 oz) (04/03 1700) Last BM Date: 09/14/17  Weight change: Filed Weights   09/14/17 0358 09/14/17 1700  Weight: 122.6 kg (270 lb 3 oz) 120.7 kg (266 lb 1.5 oz)    Intake/Output:   Intake/Output Summary (Last 24 hours) at 09/14/2017 1950 Last data filed at 09/14/2017 1700 Gross per 24 hour  Intake 743.01 ml  Output -  Net 743.01 ml      Physical Exam    General:  Ill-appearing. Weak  No resp difficulty HEENT: normal scleral icterus Neck: supple. JVP to ear. Carotids 2+ bilat; no bruits. No lymphadenopathy or thyromegaly appreciated. Cor: PMI laterally displaced. IRR +s3 and RV lift Lungs: clear Abdomen: obese. soft, mildly tender, nondistended. Liver edge down No bruits or masses. Good bowel sounds. Extremities: no cyanosis, clubbing, rash, 2+ edema cool. Cast on RLE.  Neuro: alert & orientedx3, cranial nerves grossly intact. moves all 4 extremities w/o difficulty. Affect pleasant   Telemetry   AFL 90s Personally reviewed  EKG    AFL 86 No ST-T wave abnormalities. Personally reviewed   Labs   Basic Metabolic Panel: Recent Labs  Lab 10/09/2017 2312 09/14/17 0113 09/14/17 0448  NA 130*  --  135  K >7.5* 3.1* 3.3*  CL 87*  --  90*  CO2 29  --  28  GLUCOSE 74  --  91  BUN 61*  --  60*  CREATININE 3.01*  --  2.96*  CALCIUM 9.3  --  9.5    Liver Function Tests: Recent Labs  Lab 10/06/2017 2312 09/14/17 0448  AST 122* 49*  ALT 18 31  ALKPHOS 135* 129*  BILITOT 11.5* 9.9*  PROT 6.8 7.3  ALBUMIN 3.0* 3.0*   No results for input(s): LIPASE, AMYLASE in the last 168 hours. Recent Labs  Lab 09/14/17 0113  AMMONIA 39*    CBC: Recent Labs  Lab 09/24/2017 2312  WBC 4.1  NEUTROABS 2.6  HGB  14.7  HCT 42.6  MCV 100.0  PLT 139*    Cardiac Enzymes: No results for input(s): CKTOTAL, CKMB, CKMBINDEX, TROPONINI in the last 168 hours.  BNP: BNP (last 3 results) Recent Labs    07/29/17 1148 09/14/17 0448  BNP 724.5* 542.4*    ProBNP (last 3

## 2017-09-15 ENCOUNTER — Encounter (HOSPITAL_COMMUNITY): Payer: Self-pay | Admitting: *Deleted

## 2017-09-15 DIAGNOSIS — I959 Hypotension, unspecified: Secondary | ICD-10-CM

## 2017-09-15 DIAGNOSIS — R160 Hepatomegaly, not elsewhere classified: Secondary | ICD-10-CM

## 2017-09-15 DIAGNOSIS — R945 Abnormal results of liver function studies: Secondary | ICD-10-CM

## 2017-09-15 DIAGNOSIS — E1149 Type 2 diabetes mellitus with other diabetic neurological complication: Secondary | ICD-10-CM

## 2017-09-15 LAB — BASIC METABOLIC PANEL
ANION GAP: 15 (ref 5–15)
Anion gap: 16 — ABNORMAL HIGH (ref 5–15)
BUN: 57 mg/dL — AB (ref 6–20)
BUN: 52 mg/dL — AB (ref 6–20)
CALCIUM: 9.5 mg/dL (ref 8.9–10.3)
CO2: 28 mmol/L (ref 22–32)
CO2: 27 mmol/L (ref 22–32)
Calcium: 9.4 mg/dL (ref 8.9–10.3)
Chloride: 87 mmol/L — ABNORMAL LOW (ref 101–111)
Chloride: 86 mmol/L — ABNORMAL LOW (ref 101–111)
Creatinine, Ser: 2.15 mg/dL — ABNORMAL HIGH (ref 0.44–1.00)
Creatinine, Ser: 1.85 mg/dL — ABNORMAL HIGH (ref 0.44–1.00)
GFR calc Af Amer: 30 mL/min — ABNORMAL LOW (ref >60)
GFR calc Af Amer: 36 mL/min — ABNORMAL LOW (ref >60)
GFR, EST NON AFRICAN AMERICAN: 26 mL/min — AB (ref >60)
GFR, EST NON AFRICAN AMERICAN: 31 mL/min — AB (ref >60)
GLUCOSE: 117 mg/dL — AB (ref 65–99)
GLUCOSE: 116 mg/dL — AB (ref 65–99)
POTASSIUM: 3.4 mmol/L — AB (ref 3.5–5.1)
Potassium: 3.3 mmol/L — ABNORMAL LOW (ref 3.5–5.1)
Sodium: 131 mmol/L — ABNORMAL LOW (ref 135–145)
Sodium: 128 mmol/L — ABNORMAL LOW (ref 135–145)

## 2017-09-15 LAB — CBC
HCT: 39.1 % (ref 36.0–46.0)
HEMOGLOBIN: 13.7 g/dL (ref 12.0–15.0)
MCH: 33.7 pg (ref 26.0–34.0)
MCHC: 35 g/dL (ref 30.0–36.0)
MCV: 96.3 fL (ref 78.0–100.0)
Platelets: 156 10*3/uL (ref 150–400)
RBC: 4.06 MIL/uL (ref 3.87–5.11)
RDW: 21.3 % — ABNORMAL HIGH (ref 11.5–15.5)
WBC: 6.1 10*3/uL (ref 4.0–10.5)

## 2017-09-15 LAB — HEPARIN LEVEL (UNFRACTIONATED)
Heparin Unfractionated: 0.79 IU/mL — ABNORMAL HIGH (ref 0.30–0.70)
Heparin Unfractionated: 0.66 IU/mL (ref 0.30–0.70)

## 2017-09-15 LAB — MAGNESIUM: MAGNESIUM: 2 mg/dL (ref 1.7–2.4)

## 2017-09-15 LAB — COOXEMETRY PANEL
Carboxyhemoglobin: 2.1 % — ABNORMAL HIGH (ref 0.5–1.5)
METHEMOGLOBIN: 0.8 % (ref 0.0–1.5)
O2 Saturation: 57.9 %
Total hemoglobin: 14.3 g/dL (ref 12.0–16.0)

## 2017-09-15 MED ORDER — POTASSIUM CHLORIDE CRYS ER 20 MEQ PO TBCR
40.0000 meq | EXTENDED_RELEASE_TABLET | Freq: Once | ORAL | Status: AC
  Administered 2017-09-15: 40 meq via ORAL

## 2017-09-15 MED ORDER — CHLORHEXIDINE GLUCONATE CLOTH 2 % EX PADS
6.0000 | MEDICATED_PAD | Freq: Every day | CUTANEOUS | Status: DC
  Administered 2017-09-15 – 2017-09-16 (×2): 6 via TOPICAL

## 2017-09-15 MED ORDER — FUROSEMIDE 10 MG/ML IJ SOLN
120.0000 mg | Freq: Three times a day (TID) | INTRAVENOUS | Status: DC
  Administered 2017-09-15 – 2017-09-16 (×5): 120 mg via INTRAVENOUS
  Filled 2017-09-15 (×2): qty 12
  Filled 2017-09-15 (×4): qty 10

## 2017-09-15 MED ORDER — SODIUM CHLORIDE 0.9% FLUSH: 10.0000 mL | INTRAVENOUS | Status: DC | PRN

## 2017-09-15 MED ORDER — SODIUM CHLORIDE 0.9% FLUSH
10.0000 mL | Freq: Two times a day (BID) | INTRAVENOUS | Status: DC
  Administered 2017-09-16: 10 mL

## 2017-09-15 NOTE — Progress Notes (Signed)
Advanced Heart Failure Rounding Note   Subjective:    Central line placed last night for shock. CVP 25. Started on milrinone 0.125. Now on NE 3 for BP support as well.   Diuresis relatively sluggish though weight down 3 pounds. Creatinine improving. CVP 27 (checked personally)  Breathing better. Denies CP. More alert   Objective:   Weight Range:  Vital Signs:   Temp:  [97.8 F (36.6 C)-97.9 F (36.6 C)] 97.8 F (36.6 C) (04/04 0733) Pulse Rate:  [36-139] 106 (04/04 0600) Resp:  [15-27] 16 (04/04 0600) BP: (60-153)/(18-133) 108/72 (04/04 0600) SpO2:  [84 %-100 %] 93 % (04/04 0600) Weight:  [119.6 kg (263 lb 10.7 oz)-120.7 kg (266 lb 1.5 oz)] 119.6 kg (263 lb 10.7 oz) (04/04 0500) Last BM Date: 09/14/17  Weight change: Filed Weights   09/14/17 0358 09/14/17 1700 09/15/17 0500  Weight: 122.6 kg (270 lb 3 oz) 120.7 kg (266 lb 1.5 oz) 119.6 kg (263 lb 10.7 oz)    Intake/Output:   Intake/Output Summary (Last 24 hours) at 09/15/2017 0935 Last data filed at 09/15/2017 1975 Gross per 24 hour  Intake 854.73 ml  Output 750 ml  Net 104.73 ml     Physical Exam: General:  Sitting in bed. Somewhat confused  No resp difficulty HEENT: normal Neck: supple. JVP to ear. Carotids 2+ bilat; no bruits. No lymphadenopathy or thryomegaly appreciated. Cor: PMI laterally displaced. Irregular +s3 and RV lift  Lungs: clear Abdomen: obese soft, nontender, nondistended. No hepatosplenomegaly. No bruits or masses. Good bowel sounds. Extremities: no cyanosis, clubbing, rash, 2+ edema RLE cast  Neuro: alert. Mildly confused at times cranial nerves grossly intact. moves all 4 extremities w/o difficulty. Affect pleasant  Telemetry: AFL 100-110 Personally reviewed    Labs: Basic Metabolic Panel: Recent Labs  Lab 09/26/2017 2312 09/14/17 0113 09/14/17 0448 09/15/17 0500  NA 130*  --  135 131*  K >7.5* 3.1* 3.3* 3.3*  CL 87*  --  90* 87*  CO2 29  --  28 28  GLUCOSE 74  --  91 117*  BUN  61*  --  60* 57*  CREATININE 3.01*  --  2.96* 2.15*  CALCIUM 9.3  --  9.5 9.5  MG  --   --   --  2.0    Liver Function Tests: Recent Labs  Lab 10/11/2017 2312 09/14/17 0448  AST 122* 49*  ALT 18 31  ALKPHOS 135* 129*  BILITOT 11.5* 9.9*  PROT 6.8 7.3  ALBUMIN 3.0* 3.0*   No results for input(s): LIPASE, AMYLASE in the last 168 hours. Recent Labs  Lab 09/14/17 0113  AMMONIA 39*    CBC: Recent Labs  Lab 10/09/2017 2312 09/15/17 0500  WBC 4.1 6.1  NEUTROABS 2.6  --   HGB 14.7 13.7  HCT 42.6 39.1  MCV 100.0 96.3  PLT 139* 156    Cardiac Enzymes: No results for input(s): CKTOTAL, CKMB, CKMBINDEX, TROPONINI in the last 168 hours.  BNP: BNP (last 3 results) Recent Labs    07/29/17 1148 09/14/17 0448  BNP 724.5* 542.4*    ProBNP (last 3 results) No results for input(s): PROBNP in the last 8760 hours.    Other results:  Imaging: Ct Abdomen Pelvis Wo Contrast  Result Date: 09/14/2017 CLINICAL DATA:  Abdominal distension and weakness. EXAM: CT ABDOMEN AND PELVIS WITHOUT CONTRAST TECHNIQUE: Multidetector CT imaging of the abdomen and pelvis was performed following the standard protocol without IV contrast. COMPARISON:  CT abdomen pelvis 08/03/2017  FINDINGS: LOWER CHEST: Severe cardiomegaly. HEPATOBILIARY: There is a patent steatosis. Hypoattenuating focus in the right hepatic lobe, likely a hepatic cyst. Status post cholecystectomy. PANCREAS: Normal parenchymal contours without ductal dilatation. No peripancreatic fluid collection. SPLEEN: Normal. ADRENALS/URINARY TRACT: --Adrenal glands: Normal. --Right kidney/ureter: No hydronephrosis, nephroureterolithiasis, perinephric stranding or solid renal mass. --Left kidney/ureter: No hydronephrosis, nephroureterolithiasis, perinephric stranding or solid renal mass. --Urinary bladder: Normal for degree of distention STOMACH/BOWEL: --Stomach/Duodenum: No hiatal hernia or other gastric abnormality. Normal duodenal course. --Small  bowel: No dilatation or inflammation. --Colon: No focal abnormality. --Appendix: Not visualized. No right lower quadrant inflammation or free fluid. VASCULAR/LYMPHATIC: Normal course and caliber of the major abdominal vessels. No abdominal or pelvic lymphadenopathy. REPRODUCTIVE: Status post hysterectomy. No adnexal mass. MUSCULOSKELETAL. No bony spinal canal stenosis or focal osseous abnormality. OTHER: None. IMPRESSION: Severe cardiomegaly and a pack steatosis. No acute abdominal or pelvic abnormality. Electronically Signed   By: Ulyses Jarred M.D.   On: 09/14/2017 03:10   US Renal  Result Date: 09/14/2017 CLINICAL DATA:  Elevated creatinine EXAM: RENAL / URINARY TRACT ULTRASOUND COMPLETE COMPARISON:  None. FINDINGS: Right Kidney: Length: 9.6 cm. Echogenicity within normal limits. No mass or hydronephrosis visualized. Left Kidney: Length: 10.1 cm. Echogenicity within normal limits. No mass or hydronephrosis visualized. Bladder: Appears normal for degree of bladder distention. IMPRESSION: No acute abnormality noted. Electronically Signed   By: Inez Catalina M.D.   On: 09/14/2017 11:44   US Abdomen Limited  Result Date: 09/14/2017 CLINICAL DATA:  Diabetes.  Obesity.  Enlarged liver. EXAM: ULTRASOUND ABDOMEN LIMITED RIGHT UPPER QUADRANT COMPARISON:  CT earlier same day. FINDINGS: Gallbladder: Surgically absent. Common bile duct: Diameter: 6 mm common normal Liver: Enlarged liver showing increased echogenicity consistent with diffuse steatosis. Portal vein is patent on color Doppler imaging with normal direction of blood flow towards the liver. IMPRESSION: Enlarged liver with increased echogenicity consistent with diffuse steatosis. Electronically Signed   By: Nelson Chimes M.D.   On: 09/14/2017 11:57   Dg Chest Port 1 View  Result Date: 09/14/2017 CLINICAL DATA:  Central line placement. EXAM: PORTABLE CHEST 1 VIEW COMPARISON:  Chest radiograph September 14, 2017 at 1020 hours FINDINGS: Interval placement of RIGHT  internal jugular central venous catheter with distal tip projecting cavoatrial junction. Stable severe cardiomegaly and pulmonary vascular congestion. No pleural effusion or focal consolidation. No pneumothorax. Single lead RIGHT AICD. Surgical clips project in bilateral chest wall. IMPRESSION: RIGHT internal jugular central venous catheter distal tip projects at cavoatrial junction. No pneumothorax. Stable cardiomegaly and pulmonary vascular congestion. Electronically Signed   By: Elon Alas M.D.   On: 09/14/2017 19:47   Dg Chest Port 1 View  Result Date: 09/14/2017 CLINICAL DATA:  Congestive heart failure EXAM: PORTABLE CHEST 1 VIEW COMPARISON:  11/26/2014 FINDINGS: Progression of marked cardiac enlargement. Bibasilar atelectasis. Possible small left effusion. Negative for pulmonary edema. IMPRESSION: Marked cardiac enlargement.  Negative for edema. Bibasilar atelectasis left greater than right Electronically Signed   By: Franchot Gallo M.D.   On: 09/14/2017 10:43      Medications:     Scheduled Medications: . allopurinol  300 mg Oral Daily  . Chlorhexidine Gluconate Cloth  6 each Topical Daily  . cycloSPORINE  1 drop Both Eyes BID  . dicyclomine  20 mg Oral Q6H  . divalproex  750 mg Oral BID  . DULoxetine  30 mg Oral BID  . ferrous sulfate  325 mg Oral Q breakfast  . letrozole  2.5 mg Oral Daily  .  loratadine  10 mg Oral Daily  . multivitamin with minerals  1 tablet Oral Daily  . olopatadine  1 drop Both Eyes BID  . pantoprazole  80 mg Oral BID  . potassium chloride  40 mEq Oral BID  . [START ON 10/11/2017] sodium chloride flush  10-40 mL Intracatheter Q12H  . venlafaxine XR  37.5 mg Oral Q breakfast     Infusions: . sodium chloride    . amiodarone 30 mg/hr (09/15/17 0920)  . furosemide Stopped (09/15/17 0034)  . heparin 1,450 Units/hr (09/15/17 5726)  . milrinone 0.125 mcg/kg/min (09/15/17 0920)  . norepinephrine (LEVOPHED) Adult infusion 3 mcg/min (09/15/17 0320)      PRN Medications:  Place/Maintain arterial line **AND** sodium chloride, acetaminophen **OR** acetaminophen, albuterol, gabapentin, ondansetron **OR** ondansetron (ZOFRAN) IV, oxyCODONE, sodium chloride flush, traZODone   Assessment:    47 y/o woman with h/o breast CA, morbid obesity, chronic systolic HF due to NICM and breast cancer admitted with cardiogenic shock and MSOF  Plan/Discussion:    1. 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 EF previously 20-25% - echo reviewed personally EF 10% with severe RV dysfunction - suspect decompensation triggered by AFL - now on milrinone 0.125 and NE 3. Co-ox 58%. CVP markedly elevated.  - Continue inotropes. Place a-line. Increase lasix to 120 IV tid - will eventually need DC-CV - no candidate for transplant or VAD due to recent breast CA and severe RV dysfunction  2. Acute on chronic systolic HF with biventricular dysfunction - as above. Low output and volume overloaded. Changes as above  3. AKI due to cardiorenal syndrome - Improving with inotropic support. Continue to follow closely   4. Atrial flutter - likley cause of decompensation.  - remains on IV amio.rates 100-110/ Continue heparin - TEE/DC-CV when more stable   4. Shock liver - hopefully will improve with hemodynamic support - liver u/s reviewed notable for severe hepatic steatosis - recheck LFTs  5. H/o recurrent breast CA - s/p mastectomy. Finished XRT 12/18  6. DM2 - continue SSI  7. Conversion disorder  CRITICAL CARE Performed by: Glori Bickers  Total critical care time: 35 minutes  Critical care time was exclusive of separately billable procedures and treating other patients.  Critical care was necessary to treat or prevent imminent or life-threatening deterioration.  Critical care was time spent personally by me (independent of midlevel providers or residents) on the following activities: development of  treatment plan with patient and/or surrogate as well as nursing, discussions with consultants, evaluation of patient's response to treatment, examination of patient, obtaining history from patient or surrogate, ordering and performing treatments and interventions, ordering and review of laboratory studies, ordering and review of radiographic studies, pulse oximetry and re-evaluation of patient's condition.    Length of Stay: 1   Glori Bickers MD 09/15/2017, 9:35 AM  Advanced Heart Failure Team Pager [phone] (M-F; [place])  Please contact [facility] for night-coverage after hours (4p -7a ) and weekends on [email or web address]

## 2017-09-15 NOTE — Procedures (Signed)
Radial Artery Catheter Insertion Procedure Note [name] [id number] 1970-11-16  Procedure: Insertion of Arterial Catheter Indications: Invasive BP monitoring  Procedure Details Consent: Risks of procedure as well as the alternatives and risks of each were explained to the (patient/caregiver).  Consent for procedure obtained. Time Out: Verified patient identification, verified procedure, site/side was marked, verified correct patient position, special equipment/implants available, medications/allergies/relevent history reviewed, required imaging and test results available.  Performed  Maximum sterile technique was used including antiseptics, cap, gloves, gown, hand hygiene, mask and sheet. Skin prep: Chlorhexidine; local anesthetic administered A antimicrobial bonded/coated single lumen catheter was placed in the left radial artery using the Seldinger technique.  Evaluation Blood flow good Complications: No apparent complications Patient did tolerate procedure well.  Krystal Bickers MD 09/15/2017, 2:06 PM

## 2017-09-15 NOTE — Progress Notes (Signed)
ANTICOAGULATION CONSULT NOTE - Follow Up Consult  Pharmacy Consult for Heparin Indication: atrial fibrillation  Allergies  Allergen Reactions  . Ace Inhibitors Other (See Comments)    hyperkalemia  . Reglan [Metoclopramide Hcl] Anxiety  . Adhesive [Tape] Itching and Rash    Specifically tegaderm, normal adhesive tape is ok   Patient Measurements: Height: 5\' 7"  (170.2 cm) Weight: 266 lb 1.5 oz (120.7 kg) IBW/kg (Calculated) : 61.6 Heparin Dosing Weight: 90.1 kg  Vital Signs: Temp: 97.9 F (36.6 C) (04/03 2344) Temp Source: Oral (04/03 2344) BP: 108/72 (04/04 0600) Pulse Rate: 106 (04/04 0600)  Labs: Recent Labs    09/28/2017 2312 09/14/17 0448 09/14/17 2020 09/15/17 0500  HGB 14.7  --   --  13.7  HCT 42.6  --   --  39.1  PLT 139*  --   --  156  LABPROT  --  14.5  --   --   INR  --  1.14  --   --   HEPARINUNFRC  --   --  <0.10* 0.79*  CREATININE 3.01* 2.96*  --   --     Estimated Creatinine Clearance: 31.6 mL/min (A) (by C-G formula based on SCr of 2.96 mg/dL (H)).   Medications:  Infusions:  . sodium chloride    . amiodarone 30 mg/hr (09/15/17 0145)  . furosemide Stopped (09/15/17 0034)  . heparin 1,550 Units/hr (09/14/17 2336)  . milrinone 0.125 mcg/kg/min (09/14/17 2052)  . norepinephrine (LEVOPHED) Adult infusion 3 mcg/min (09/15/17 0320)    Assessment: 47 year old female admitted from [facility] 09/14/17 in cardiogenic shock and new atrial flutter on IV heparin.  4/4 AM Update: heparin level supra-therapeutic this AM  Goal of Therapy:  Heparin level 0.3-0.7 units/ml Monitor platelets by anticoagulation protocol: Yes   Plan:  Dec heparin to 1450 units/hr [place], PharmD, BCPS Clinical Pharmacist Phone: [phone]

## 2017-09-15 NOTE — Progress Notes (Signed)
PROGRESS NOTE  [name] QMV:[id number] DOB: 05-11-1971 DOA: 10/04/2017 PCP: Mellody Dance, DO  HPI/Recap of past 24 hours: [name] is a 47 y.o. female with medical history significant of NICM, EF 20-25%, AICD, fibromyalgia, conversion disorder.  Patient presents to [facility] ED with c/o fatigue, generalized weakness, confusion, feeling unsteady, and falls, which has been ongoing for the past month or more.  She stopped her meds 2 days ago to see if this would help, but didn't really help.  Recent fall with fracture of right lower extremity.  In the ED, Bili 11 up from 1.0.  Creat 3 up from 1.  K initially read as >7.5 however was hemolyzed, on repeat K is 3.1. EKG showed A-flutter which is ?new. CT abd/pelvis showed severe cardiomegaly with hepatic steatosis. Pt was noted to be hypotensive with SBP in the 70s. Cardiology was consulted and pt was transferred to [facility] ICU with cardiology the primary attending. Pt is currently being managed for cardiogenic shock with multi system failure. TRH was asked to co-manage.  Today, patient reported feeling slightly better, was able to hold a conversation, still lethargic.  Oriented to self, place.  Easily arousable, more alert.  Denies any worsening shortness of breath, chest pain, nausea/vomiting, abdominal pain, fever/chills.   Assessment/Plan: Principal Problem:   Acute kidney failure (HCC) Active Problems:   Chronic systolic CHF (congestive heart failure) (HCC)   Hypotension   Conversion disorder   ICD in place (MDT 2013)   Diabetes mellitus, type II ([place])   Jaundice   Acute systolic heart failure ([place])  Cardiogenic shock/NICM History NICM from ?chemotherapy, decompensated by new atrial flutter ECHO: EF of 20-25%, with diffuse hypokinesis, unable to determine diastolic function due to atrial flutter Cath showed nonobstructive CAD in 2012 Cardiology on board: On milrinone, Levophed, CVP elevated Not a candidate for transplant or LVAD due to  recent breast CA and severe RV dysfunction.  May eventually need DC-CV Daily weights, strict I's and O's  Acute on chronic decompensated systolic heart failure s/p AICD Biventricular dysfunction Continue diuresis with IV Lasix Management as above  Atrial flutter Heart rates in the 100-110s Continue amiodarone drip, IV heparin May need TEE/DC-CV when more stable  AKI Improving Baseline creatinine WNL, 3 on presentation Likely due to cardiorenal syndrome Renal ultrasound, no hydronephrosis Management as above with inotropic support Daily BMP  Shock liver Improving Elevated transaminitis, likely due to hepatic congestion Liver ultrasound, hepatic steatosis Daily LFTs  Diabetes mellitus type 2 A1c on 1/19 showed 5.6 Diet control Continue CBGs  Conversion disorder/fibromyalgia Continue Cymbalta, venlafaxine  Recurrent breast CA Recently diagnosed with recurrent Breast CA.Underwentlumpectomy, with clean margins.Completed XRTat end of 12/18 Continue letrozole    Code Status: Full  Family Communication: None at bedside  Disposition Plan: Once stable from cardiology stand point    Consultants:  [place]  Procedures:  Arterial line placement  Antimicrobials:  None   DVT prophylaxis:  IV heparin   Objective: Vitals:   09/15/17 1145 09/15/17 1153 09/15/17 1200 09/15/17 1215  BP: (!) 88/73  97/85 102/76  Pulse: 94  (!) 103 95  Resp: 19  (!) 21 19  Temp:  97.9 F (36.6 C)    TempSrc:  Oral    SpO2: 94%  95% 93%  Weight:      Height:        Intake/Output Summary (Last 24 hours) at 09/15/2017 1228 Last data filed at 09/15/2017 1200 Gross per 24 hour  Intake 1406.9 ml  Output  1100 ml  Net 306.9 ml   Filed Weights   09/14/17 0358 09/14/17 1700 09/15/17 0500  Weight: 122.6 kg (270 lb 3 oz) 120.7 kg (266 lb 1.5 oz) 119.6 kg (263 lb 10.7 oz)    Exam:   General: Lethargic, alert, easily arousable, NAD  Cardiovascular: S1, S2 present, irregular,  +JVP  Respiratory: CTA bilaterally  Abdomen: Obese, soft, non-tender, non-distended  Musculoskeletal: 2+ edema, RLE cast  Skin: Normal  Psychiatry: Normal mood   Data Reviewed: CBC: Recent Labs  Lab 09/17/2017 2312 09/15/17 0500  WBC 4.1 6.1  NEUTROABS 2.6  --   HGB 14.7 13.7  HCT 42.6 39.1  MCV 100.0 96.3  PLT 139* 169   Basic Metabolic Panel: Recent Labs  Lab 10/05/2017 2312 09/14/17 0113 09/14/17 0448 09/15/17 0500  NA 130*  --  135 131*  K >7.5* 3.1* 3.3* 3.3*  CL 87*  --  90* 87*  CO2 29  --  28 28  GLUCOSE 74  --  91 117*  BUN 61*  --  60* 57*  CREATININE 3.01*  --  2.96* 2.15*  CALCIUM 9.3  --  9.5 9.5  MG  --   --   --  2.0   GFR: Estimated Creatinine Clearance: 43.3 mL/min (A) (by C-G formula based on SCr of 2.15 mg/dL (H)). Liver Function Tests: Recent Labs  Lab 09/25/2017 2312 09/14/17 0448  AST 122* 49*  ALT 18 31  ALKPHOS 135* 129*  BILITOT 11.5* 9.9*  PROT 6.8 7.3  ALBUMIN 3.0* 3.0*   No results for input(s): LIPASE, AMYLASE in the last 168 hours. Recent Labs  Lab 09/14/17 0113  AMMONIA 39*   Coagulation Profile: Recent Labs  Lab 09/14/17 0448  INR 1.14   Cardiac Enzymes: No results for input(s): CKTOTAL, CKMB, CKMBINDEX, TROPONINI in the last 168 hours. BNP (last 3 results) No results for input(s): PROBNP in the last 8760 hours. HbA1C: No results for input(s): HGBA1C in the last 72 hours. CBG: Recent Labs  Lab 09/19/2017 2156  GLUCAP 84   Lipid Profile: No results for input(s): CHOL, HDL, LDLCALC, TRIG, CHOLHDL, LDLDIRECT in the last 72 hours. Thyroid Function Tests: No results for input(s): TSH, T4TOTAL, FREET4, T3FREE, THYROIDAB in the last 72 hours. Anemia Panel: No results for input(s): VITAMINB12, FOLATE, FERRITIN, TIBC, IRON, RETICCTPCT in the last 72 hours. Urine analysis:    Component Value Date/Time   COLORURINE AMBER (A) 09/14/2017 0122   APPEARANCEUR CLEAR 09/14/2017 0122   LABSPEC 1.013 09/14/2017 0122    PHURINE 5.0 09/14/2017 0122   GLUCOSEU NEGATIVE 09/14/2017 0122   HGBUR NEGATIVE 09/14/2017 0122   BILIRUBINUR SMALL (A) 09/14/2017 0122   KETONESUR NEGATIVE 09/14/2017 0122   PROTEINUR NEGATIVE 09/14/2017 0122   UROBILINOGEN 0.2 05/02/2008 1222   NITRITE NEGATIVE 09/14/2017 0122   LEUKOCYTESUR NEGATIVE 09/14/2017 0122   Sepsis Labs: @LABRCNTIP (procalcitonin:4,lacticidven:4)  ) Recent Results (from the past 240 hour(s))  MRSA PCR Screening     Status: None   Collection Time: 09/14/17  5:04 PM  Result Value Ref Range Status   MRSA by PCR NEGATIVE NEGATIVE Final    Comment:        The GeneXpert MRSA Assay (FDA approved for NASAL specimens only), is one component of a comprehensive MRSA colonization surveillance program. It is not intended to diagnose MRSA infection nor to guide or monitor treatment for MRSA infections. Performed at [facility], [place] [address], [place], [place] [address]  Studies: Dg Chest Port 1 View  Result Date: 09/14/2017 CLINICAL DATA:  Central line placement. EXAM: PORTABLE CHEST 1 VIEW COMPARISON:  Chest radiograph September 14, 2017 at 1020 hours FINDINGS: Interval placement of RIGHT internal jugular central venous catheter with distal tip projecting cavoatrial junction. Stable severe cardiomegaly and pulmonary vascular congestion. No pleural effusion or focal consolidation. No pneumothorax. Single lead RIGHT AICD. Surgical clips project in bilateral chest wall. IMPRESSION: RIGHT internal jugular central venous catheter distal tip projects at cavoatrial junction. No pneumothorax. Stable cardiomegaly and pulmonary vascular congestion. Electronically Signed   By: Elon Alas M.D.   On: 09/14/2017 19:47    Scheduled Meds: . allopurinol  300 mg Oral Daily  . Chlorhexidine Gluconate Cloth  6 each Topical Daily  . cycloSPORINE  1 drop Both Eyes BID  . dicyclomine  20 mg Oral Q6H  . divalproex  750 mg Oral BID  . DULoxetine  30 mg Oral BID  .  ferrous sulfate  325 mg Oral Q breakfast  . letrozole  2.5 mg Oral Daily  . loratadine  10 mg Oral Daily  . multivitamin with minerals  1 tablet Oral Daily  . olopatadine  1 drop Both Eyes BID  . pantoprazole  80 mg Oral BID  . potassium chloride  40 mEq Oral BID  . [START ON 09/15/2017] sodium chloride flush  10-40 mL Intracatheter Q12H  . venlafaxine XR  37.5 mg Oral Q breakfast    Continuous Infusions: . sodium chloride    . amiodarone 30 mg/hr (09/15/17 0920)  . furosemide    . heparin 1,450 Units/hr (09/15/17 1056)  . milrinone 0.125 mcg/kg/min (09/15/17 0920)  . norepinephrine (LEVOPHED) Adult infusion 3 mcg/min (09/15/17 0320)     LOS: 1 day     Alma Friendly, MD [facility]   If 7PM-7AM, please contact night-coverage [email or web address] Password [facility] 09/15/2017, 12:28 PM

## 2017-09-15 NOTE — Progress Notes (Signed)
Patient aggressively IV diuresing, Pure-wick in place but bed sheets soaked in urine.  Patient's urine output not accurately reflected due to Pure-wick ineffectiveness.  Foley to be placed for aggressive IV diuresing and accurate urine output documentation.

## 2017-09-15 NOTE — Progress Notes (Addendum)
ANTICOAGULATION CONSULT NOTE - Follow Up Consult  Pharmacy Consult for Heparin Indication: atrial fibrillation  Allergies  Allergen Reactions  . Ace Inhibitors Other (See Comments)    hyperkalemia  . Reglan [Metoclopramide Hcl] Anxiety  . Adhesive [Tape] Itching and Rash    Specifically tegaderm, normal adhesive tape is ok   Patient Measurements: Height: 5\' 7"  (170.2 cm) Weight: 263 lb 10.7 oz (119.6 kg) IBW/kg (Calculated) : 61.6 Heparin Dosing Weight: 90.1 kg  Vital Signs: Temp: 97.9 F (36.6 C) (04/04 1153) Temp Source: Oral (04/04 1200) BP: 102/81 (04/04 1345) Pulse Rate: 89 (04/04 1445)  Labs: Recent Labs    09/24/2017 2312 09/14/17 0448 09/14/17 2020 09/15/17 0500 09/15/17 1430  HGB 14.7  --   --  13.7  --   HCT 42.6  --   --  39.1  --   PLT 139*  --   --  156  --   LABPROT  --  14.5  --   --   --   INR  --  1.14  --   --   --   HEPARINUNFRC  --   --  <0.10* 0.79* 0.66  CREATININE 3.01* 2.96*  --  2.15*  --     Estimated Creatinine Clearance: 43.3 mL/min (A) (by C-G formula based on SCr of 2.15 mg/dL (H)).   Medications:  Infusions:  . sodium chloride    . amiodarone 30 mg/hr (09/15/17 0920)  . furosemide    . heparin 1,450 Units/hr (09/15/17 1056)  . milrinone 0.125 mcg/kg/min (09/15/17 0920)  . norepinephrine (LEVOPHED) Adult infusion 5 mcg/min (09/15/17 1446)    Assessment: 47 year old female admitted from [place] 09/14/17 in cardiogenic shock and new atrial flutter on IV heparin.  Heparin level now within therapeutic range this afternoon at 0.66, no bleeding issues noted. At upper end of range will reduce rate slightly.   Goal of Therapy:  Heparin level 0.3-0.7 units/ml Monitor platelets by anticoagulation protocol: Yes   Plan:  Dec heparin to 1350 units/hr Recheck labs in am  Erin Hearing PharmD., BCPS Clinical Pharmacist 09/15/2017 3:34 PM

## 2017-09-16 ENCOUNTER — Encounter (HOSPITAL_COMMUNITY): Admission: EM | Disposition: E | Payer: Self-pay | Source: Home / Self Care | Attending: Cardiovascular Disease

## 2017-09-16 ENCOUNTER — Inpatient Hospital Stay (HOSPITAL_COMMUNITY): Payer: Medicare Other | Admitting: Anesthesiology

## 2017-09-16 ENCOUNTER — Inpatient Hospital Stay (HOSPITAL_COMMUNITY): Payer: Medicare Other

## 2017-09-16 ENCOUNTER — Encounter (HOSPITAL_COMMUNITY): Payer: Self-pay | Admitting: Certified Registered Nurse Anesthetist

## 2017-09-16 DIAGNOSIS — I351 Nonrheumatic aortic (valve) insufficiency: Secondary | ICD-10-CM

## 2017-09-16 DIAGNOSIS — I340 Nonrheumatic mitral (valve) insufficiency: Secondary | ICD-10-CM

## 2017-09-16 LAB — BASIC METABOLIC PANEL
ANION GAP: 19 — AB (ref 5–15)
Anion gap: 17 — ABNORMAL HIGH (ref 5–15)
BUN: 49 mg/dL — AB (ref 6–20)
BUN: 49 mg/dL — ABNORMAL HIGH (ref 6–20)
CO2: 27 mmol/L (ref 22–32)
CO2: 24 mmol/L (ref 22–32)
CREATININE: 1.72 mg/dL — AB (ref 0.44–1.00)
Calcium: 9.4 mg/dL (ref 8.9–10.3)
Calcium: 9.7 mg/dL (ref 8.9–10.3)
Chloride: 83 mmol/L — ABNORMAL LOW (ref 101–111)
Chloride: 85 mmol/L — ABNORMAL LOW (ref 101–111)
Creatinine, Ser: 1.84 mg/dL — ABNORMAL HIGH (ref 0.44–1.00)
GFR calc Af Amer: 40 mL/min — ABNORMAL LOW (ref >60)
GFR calc Af Amer: 37 mL/min — ABNORMAL LOW (ref >60)
GFR, EST NON AFRICAN AMERICAN: 34 mL/min — AB (ref >60)
GFR, EST NON AFRICAN AMERICAN: 32 mL/min — AB (ref >60)
GLUCOSE: 126 mg/dL — AB (ref 65–99)
Glucose, Bld: 129 mg/dL — ABNORMAL HIGH (ref 65–99)
Potassium: 3.9 mmol/L (ref 3.5–5.1)
Potassium: 4.7 mmol/L (ref 3.5–5.1)
SODIUM: 127 mmol/L — AB (ref 135–145)
Sodium: 128 mmol/L — ABNORMAL LOW (ref 135–145)

## 2017-09-16 LAB — HEPATIC FUNCTION PANEL
ALBUMIN: 2.9 g/dL — AB (ref 3.5–5.0)
ALK PHOS: 133 U/L — AB (ref 38–126)
ALT: 25 U/L (ref 14–54)
AST: 42 U/L — ABNORMAL HIGH (ref 15–41)
BILIRUBIN TOTAL: 8.4 mg/dL — AB (ref 0.3–1.2)
Bilirubin, Direct: 4.6 mg/dL — ABNORMAL HIGH (ref 0.1–0.5)
Indirect Bilirubin: 3.8 mg/dL — ABNORMAL HIGH (ref 0.3–0.9)
Total Protein: 7.4 g/dL (ref 6.5–8.1)

## 2017-09-16 LAB — COOXEMETRY PANEL
Carboxyhemoglobin: 1.6 % — ABNORMAL HIGH (ref 0.5–1.5)
Carboxyhemoglobin: 1.9 % — ABNORMAL HIGH (ref 0.5–1.5)
METHEMOGLOBIN: 0.6 % (ref 0.0–1.5)
Methemoglobin: 1.1 % (ref 0.0–1.5)
O2 SAT: 67.3 %
O2 Saturation: 88.8 %
TOTAL HEMOGLOBIN: 14.1 g/dL (ref 12.0–16.0)
TOTAL HEMOGLOBIN: 14.5 g/dL (ref 12.0–16.0)

## 2017-09-16 LAB — POCT I-STAT 3, ART BLOOD GAS (G3+)
Acid-Base Excess: 3 mmol/L — ABNORMAL HIGH (ref 0.0–2.0)
Bicarbonate: 24.5 mmol/L (ref 20.0–28.0)
O2 SAT: 94 %
PO2 ART: 63 mmHg — AB (ref 83.0–108.0)
TCO2: 25 mmol/L (ref 22–32)
pCO2 arterial: 30.9 mmHg — ABNORMAL LOW (ref 32.0–48.0)
pH, Arterial: 7.507 — ABNORMAL HIGH (ref 7.350–7.450)

## 2017-09-16 LAB — CBC
HEMATOCRIT: 39.4 % (ref 36.0–46.0)
HEMOGLOBIN: 14.1 g/dL (ref 12.0–15.0)
MCH: 33.7 pg (ref 26.0–34.0)
MCHC: 35.8 g/dL (ref 30.0–36.0)
MCV: 94.3 fL (ref 78.0–100.0)
Platelets: 175 10*3/uL (ref 150–400)
RBC: 4.18 MIL/uL (ref 3.87–5.11)
RDW: 20.6 % — ABNORMAL HIGH (ref 11.5–15.5)
WBC: 7.6 10*3/uL (ref 4.0–10.5)

## 2017-09-16 LAB — HEPARIN LEVEL (UNFRACTIONATED): Heparin Unfractionated: 0.62 IU/mL (ref 0.30–0.70)

## 2017-09-16 SURGERY — ECHOCARDIOGRAM, TRANSESOPHAGEAL
Anesthesia: Monitor Anesthesia Care

## 2017-09-16 MED ORDER — SODIUM CHLORIDE 0.9 % IV SOLN: 250.0000 mL | INTRAVENOUS | Status: DC

## 2017-09-16 MED ORDER — FUROSEMIDE 10 MG/ML IJ SOLN
10.0000 mg/h | INTRAMUSCULAR | Status: DC
  Administered 2017-09-17: 10 mg/h via INTRAVENOUS
  Filled 2017-09-16: qty 20

## 2017-09-16 MED ORDER — MIDAZOLAM HCL 2 MG/2ML IJ SOLN
INTRAMUSCULAR | Status: AC
  Administered 2017-09-16: 1 mg
  Filled 2017-09-16: qty 2

## 2017-09-16 MED ORDER — FENTANYL CITRATE (PF) 100 MCG/2ML IJ SOLN
INTRAMUSCULAR | Status: AC
  Administered 2017-09-16: 25 ug
  Filled 2017-09-16: qty 2

## 2017-09-16 MED ORDER — METOLAZONE 2.5 MG PO TABS: 2.5000 mg | ORAL_TABLET | Freq: Once | ORAL | Status: DC

## 2017-09-16 MED ORDER — NOREPINEPHRINE BITARTRATE 1 MG/ML IV SOLN
0.0000 ug/min | INTRAVENOUS | Status: DC
  Administered 2017-09-16: 11 ug/min via INTRAVENOUS
  Administered 2017-09-16: 27 ug/min via INTRAVENOUS
  Administered 2017-09-17: 38 ug/min via INTRAVENOUS
  Administered 2017-09-17: 34 ug/min via INTRAVENOUS
  Filled 2017-09-16 (×4): qty 16

## 2017-09-16 MED ORDER — FLUMAZENIL 0.5 MG/5ML IV SOLN
INTRAVENOUS | Status: AC
  Administered 2017-09-16: 0.2 mg
  Filled 2017-09-16: qty 5

## 2017-09-16 MED ORDER — SODIUM CHLORIDE 0.9% FLUSH: 3.0000 mL | INTRAVENOUS | Status: DC | PRN

## 2017-09-16 MED ORDER — METOLAZONE 5 MG PO TABS
5.0000 mg | ORAL_TABLET | Freq: Once | ORAL | Status: AC
  Administered 2017-09-16: 5 mg via ORAL
  Filled 2017-09-16: qty 1

## 2017-09-16 MED ORDER — SODIUM CHLORIDE 0.9% FLUSH
3.0000 mL | Freq: Two times a day (BID) | INTRAVENOUS | Status: DC
  Administered 2017-09-16: 3 mL via INTRAVENOUS

## 2017-09-16 MED ORDER — LEVALBUTEROL HCL 0.63 MG/3ML IN NEBU
INHALATION_SOLUTION | RESPIRATORY_TRACT | Status: AC
  Filled 2017-09-16: qty 3

## 2017-09-16 MED ORDER — NALOXONE HCL 0.4 MG/ML IJ SOLN
INTRAMUSCULAR | Status: AC
  Administered 2017-09-16: 0.2 mg
  Filled 2017-09-16: qty 1

## 2017-09-16 MED ORDER — SODIUM CHLORIDE 0.9 % IV SOLN
INTRAVENOUS | Status: DC
  Administered 2017-09-16: 20:00:00 via INTRAVENOUS

## 2017-09-16 MED ORDER — LEVALBUTEROL HCL 0.63 MG/3ML IN NEBU
0.6300 mg | INHALATION_SOLUTION | Freq: Once | RESPIRATORY_TRACT | Status: AC
  Administered 2017-09-16: 0.63 mg via RESPIRATORY_TRACT

## 2017-09-16 MED ORDER — FLUMAZENIL 0.5 MG/5ML IV SOLN
INTRAVENOUS | Status: AC
  Filled 2017-09-16: qty 5

## 2017-09-16 NOTE — Progress Notes (Signed)
ANTICOAGULATION CONSULT NOTE - Follow Up Consult  Pharmacy Consult for Heparin Indication: atrial fibrillation  Allergies  Allergen Reactions  . Ace Inhibitors Other (See Comments)    hyperkalemia  . Reglan [Metoclopramide Hcl] Anxiety  . Adhesive [Tape] Itching and Rash    Specifically tegaderm, normal adhesive tape is ok   Patient Measurements: Height: 5\' 7"  (170.2 cm) Weight: 264 lb 8.8 oz (120 kg) IBW/kg (Calculated) : 61.6 Heparin Dosing Weight: 90.1 kg  Vital Signs: Temp: 98.3 F (36.8 C) (04/05 0734) Temp Source: Oral (04/05 0734) BP: 70/29 (04/05 0649) Pulse Rate: 109 (04/05 0900)  Labs: Recent Labs    09/12/2017 2312 09/14/17 0448  09/15/17 0500 09/15/17 1430 09/15/17 1736 10/09/2017 0245  HGB 14.7  --   --  13.7  --   --  14.1  HCT 42.6  --   --  39.1  --   --  39.4  PLT 139*  --   --  156  --   --  175  LABPROT  --  14.5  --   --   --   --   --   INR  --  1.14  --   --   --   --   --   HEPARINUNFRC  --   --    < > 0.79* 0.66  --  0.62  CREATININE 3.01* 2.96*  --  2.15*  --  1.85* 1.72*   < > = values in this interval not displayed.    Estimated Creatinine Clearance: 54.3 mL/min (A) (by C-G formula based on SCr of 1.72 mg/dL (H)).   Medications:  Infusions:  . sodium chloride    . amiodarone 30 mg/hr (09/12/2017 0800)  . furosemide Stopped (09/15/17 2359)  . heparin 1,350 Units/hr (10/06/2017 0800)  . milrinone 0.125 mcg/kg/min (10/03/2017 0911)  . norepinephrine (LEVOPHED) Adult infusion 26 mcg/min (09/15/2017 2409)    Assessment: 47 year old female admitted from [place] 09/14/17 in cardiogenic shock and new atrial flutter on IV heparin.  Heparin level now within therapeutic range this afternoon at 0.62 on drip rate 1350 uts/hr, no bleeding issues noted.  Goal of Therapy:  Heparin level 0.3-0.7 units/ml Monitor platelets by anticoagulation protocol: Yes   Plan:  Continue heparin to 1350 units/hr Daily  HL, CBC  Bonnita Nasuti Pharm.D. CPP,  BCPS Clinical Pharmacist [phone] 09/24/2017 9:34 AM

## 2017-09-16 NOTE — Progress Notes (Signed)
TEE/cardioversion procedure on hold. Versed 2 mg not needed and wasted in trash as witnessed by Garrison Columbus CRNA.

## 2017-09-16 NOTE — Progress Notes (Signed)
Please note: the co-ox resulted at 0249 was 88%.   RN re-drew sample at 0320 resulting at 67%

## 2017-09-16 NOTE — CV Procedure (Signed)
    TRANSESOPHAGEAL ECHOCARDIOGRAM   NAME:  [name]   MRN: [id number] DOB:  [birth year]   ADMIT DATE: 09/12/2017  INDICATIONS: Atrial flutter   PROCEDURE:   Informed consent was obtained prior to the procedure. The risks, benefits and alternatives for the procedure were discussed and the patient and her family. comprehended these risks.  Risks include, but are not limited to, cough, sore throat, vomiting, nausea, somnolence, esophageal and stomach trauma or perforation, bleeding, low blood pressure, aspiration, pneumonia, infection, trauma to the teeth and death. Given how ill the patient was, I emphasized the high-risk nature of the procedure and all agreed to proceed.   CCU staff, RT and a cath lab nurse were all present at the bedside   After a procedural time-out, the patient was given 1mg  versed and 25 mcg fentanyl for moderate sedation.  The oropharynx was anesthetized with cetacaine spray.  The transesophageal probe was inserted in the esophagus and stomach without difficulty and multiple views were obtained.    COMPLICATIONS:    There were no immediate complications. After the procedure the patient was given romazicon and narcan to help reverse the sedation. The patient returned to baseline quickly.  FINDINGS:  LEFT VENTRICLE: EF = 10%. Severe global HK.  RIGHT VENTRICLE: Severely reduced function. + ICD wire  LEFT ATRIUM: Dilated  LEFT ATRIAL APPENDAGE: Large clot with heavy smoke  RIGHT ATRIUM: Dilated  AORTIC VALVE:  [place]. Mild AI  MITRAL VALVE:    Normal. Moderate MR.  TRICUSPID VALVE: Normal. Moderate TR.   PULMONIC VALVE: Grossly normal.  INTERATRIAL SEPTUM: No obvious PFO or ASD.  PERICARDIUM: No effusion  DESCENDING AORTA: Not visualized   Glori Bickers, MD  8:13 PM     Benay Spice 6:29 PM

## 2017-09-16 NOTE — Progress Notes (Signed)
  Echocardiogram Echocardiogram Transesophageal has been performed.  [name] 09/12/2017, 6:06 PM

## 2017-09-16 NOTE — Anesthesia Preprocedure Evaluation (Deleted)
Anesthesia Evaluation  Patient identified by MRN, date of birth, ID band Patient awake  General Assessment Comment:47 y/o woman with h/o breast CA, morbid obesity, chronic systolic HF due to NICM and breast cancer admitted with cardiogenic shock and MSOF  Reviewed: Allergy & Precautions, NPO status , Patient's Chart, lab work & pertinent test results  Airway Mallampati: II  TM Distance: >3 FB Neck ROM: Full    Dental no notable dental hx. (+) Missing   Pulmonary shortness of breath, asthma , COPD,    Pulmonary exam normal breath sounds clear to auscultation       Cardiovascular hypertension, Pt. on medications + angina +CHF (chemo induced cardiomyopathy) and + Orthopnea  + dysrhythmias Atrial Fibrillation + Cardiac Defibrillator  Rhythm:Irregular Rate:Tachycardia  Milrinone 0.125 mcg and norepi 25 mcg. On amio 30 mg per hour  ECHO this admit 10% with severe RV dysfunction   Neuro/Psych  Headaches, Seizures -,  PSYCHIATRIC DISORDERS Anxiety Depression Conversion disorder. Non-epileptic seizures Neuromuscular disease negative neurological ROS     GI/Hepatic GERD  ,Shock liver   Endo/Other  diabetesMorbid obesity  Renal/GU ARFRenal disease (AKI due to cardiorenal syndrome)  negative genitourinary   Musculoskeletal  (+) Arthritis , Fibromyalgia -  Abdominal   Peds negative pediatric ROS (+)  Hematology  (+) Blood dyscrasia, anemia ,   Anesthesia Other Findings   Reproductive/Obstetrics negative OB ROS                            Anesthesia Physical  Anesthesia Plan  ASA: IV  Anesthesia Plan: MAC   Post-op Pain Management:    Induction: Intravenous  PONV Risk Score and Plan: 2 and Treatment may vary due to age or medical condition  Airway Management Planned: Simple Face Mask  Additional Equipment:   Intra-op Plan:   Post-operative Plan:   Informed Consent: I have reviewed the  patients History and Physical, chart, labs and discussed the procedure including the risks, benefits and alternatives for the proposed anesthesia with the patient or authorized representative who has indicated his/her understanding and acceptance.   Dental advisory given  Plan Discussed with: CRNA  Anesthesia Plan Comments: (Sedation for TEE/Cardioversion in ICU.  Patient critically ill with multisystem organ failure.)       Anesthesia Quick Evaluation

## 2017-09-16 NOTE — Progress Notes (Signed)
PROGRESS NOTE  [name] KYH:[id number] DOB: 09/02/1970 DOA: 09/23/2017 PCP: Mellody Dance, DO  HPI/Recap of past 24 hours: [name] is a 47 y.o. female with medical history significant of NICM, EF 20-25%, AICD, fibromyalgia, conversion disorder.  Patient presents to [facility] ED with c/o fatigue, generalized weakness, confusion, feeling unsteady, and falls, which has been ongoing for the past month or more.  She stopped her meds 2 days ago to see if this would help, but didn't really help.  Recent fall with fracture of right lower extremity.  In the ED, Bili 11 up from 1.0.  Creat 3 up from 1.  K initially read as >7.5 however was hemolyzed, on repeat K is 3.1. EKG showed A-flutter which is ?new. CT abd/pelvis showed severe cardiomegaly with hepatic steatosis. Pt was noted to be hypotensive with SBP in the 70s. Cardiology was consulted and pt was transferred to [facility] ICU with cardiology the primary attending. Pt is currently being managed for cardiogenic shock with multi system failure. TRH was asked to co-manage.  Today, patient noted to be very lethargic, unable to hold meaningful conversation.  Very ill appearing, remains hypotensive, still significantly short of breath. Denies chest pain, nausea/vomiting, abdominal pain, fever/chills.   Assessment/Plan: Principal Problem:   Acute kidney failure (HCC) Active Problems:   Chronic systolic CHF (congestive heart failure) (HCC)   Hypotension   Conversion disorder   ICD in place (MDT 2013)   Diabetes mellitus, type II ([place])   Jaundice   Acute systolic heart failure (HCC)  Cardiogenic shock/NICM Worsening, despite pressors History NICM from ?chemotherapy, decompensated by new atrial flutter ECHO: EF of 20-25%, with diffuse hypokinesis, unable to determine diastolic function due to atrial flutter Cath showed nonobstructive CAD in 2012 Cardiology on board: On milrinone, Levophed, CVP elevated Not a candidate for transplant or LVAD due to  recent breast CA and severe RV dysfunction.  May eventually need DC-CV Daily weights, strict I's and O's Due to worsening symptoms, palliative team has been consulted as prognosis is very poor  Acute on chronic decompensated systolic heart failure s/p AICD Biventricular dysfunction Continue diuresis with IV Lasix Management as above  Atrial flutter Heart rates in the 100-110s Continue amiodarone drip, IV heparin May need TEE/DC-CV  AKI Improving Baseline creatinine WNL, 3 on presentation Likely due to cardiorenal syndrome Renal ultrasound, no hydronephrosis Management as above with inotropic support Daily BMP  Shock liver Improving Elevated transaminitis, likely due to hepatic congestion Liver ultrasound, hepatic steatosis Daily LFTs  Diabetes mellitus type 2 A1c on 1/19 showed 5.6 Diet control Continue CBGs  Conversion disorder/fibromyalgia Continue Cymbalta, venlafaxine  Recurrent breast CA Recently diagnosed with recurrent Breast CA.Underwentlumpectomy, with clean margins.Completed XRTat end of 12/18 Continue letrozole    Code Status: Full  Family Communication: None at bedside  Disposition Plan: Poor prognosis, palliative team consulted   Consultants:  TRH  Procedures:  Arterial line placement  Antimicrobials:  None   DVT prophylaxis:  IV heparin   Objective: Vitals:   10/03/2017 1100 10/05/2017 1130 10/08/2017 1200 10/07/2017 1230  BP:      Pulse: (!) 106 (!) 107 (!) 106 (!) 106  Resp: 19 (!) 25 (!) 23 (!) 22  Temp:      TempSrc:      SpO2: 99% 96% 96% 95%  Weight:      Height:        Intake/Output Summary (Last 24 hours) at 10/01/2017 1532 Last data filed at 10/08/2017 1200 Gross per 24 hour  Intake 1519.6 ml  Output 2625 ml  Net -1105.4 ml   Filed Weights   09/14/17 1700 09/15/17 0500 09/19/2017 0500  Weight: 120.7 kg (266 lb 1.5 oz) 119.6 kg (263 lb 10.7 oz) 120 kg (264 lb 8.8 oz)    Exam:   General: Very ill-appearing,  lethargic  Cardiovascular: S1, S2 present, irregular, +JVP  Respiratory: CTA bilaterally  Abdomen: Obese, soft, non-tender, non-distended  Musculoskeletal: 2+ edema, RLE cast  Skin: Normal  Psychiatry: Normal mood   Data Reviewed: CBC: Recent Labs  Lab 09/25/2017 2312 09/15/17 0500 10/10/2017 0245  WBC 4.1 6.1 7.6  NEUTROABS 2.6  --   --   HGB 14.7 13.7 14.1  HCT 42.6 39.1 39.4  MCV 100.0 96.3 94.3  PLT 139* 156 245   Basic Metabolic Panel: Recent Labs  Lab 09/28/2017 2312 09/14/17 0113 09/14/17 0448 09/15/17 0500 09/15/17 1736 09/30/2017 0245  NA 130*  --  135 131* 128* 127*  K >7.5* 3.1* 3.3* 3.3* 3.4* 3.9  CL 87*  --  90* 87* 86* 83*  CO2 29  --  28 28 27 27   GLUCOSE 74  --  91 117* 116* 129*  BUN 61*  --  60* 57* 52* 49*  CREATININE 3.01*  --  2.96* 2.15* 1.85* 1.72*  CALCIUM 9.3  --  9.5 9.5 9.4 9.4  MG  --   --   --  2.0  --   --    GFR: Estimated Creatinine Clearance: 54.3 mL/min (A) (by C-G formula based on SCr of 1.72 mg/dL (H)). Liver Function Tests: Recent Labs  Lab 09/26/2017 2312 09/14/17 0448 09/28/2017 0245  AST 122* 49* 42*  ALT 18 31 25   ALKPHOS 135* 129* 133*  BILITOT 11.5* 9.9* 8.4*  PROT 6.8 7.3 7.4  ALBUMIN 3.0* 3.0* 2.9*   No results for input(s): LIPASE, AMYLASE in the last 168 hours. Recent Labs  Lab 09/14/17 0113  AMMONIA 39*   Coagulation Profile: Recent Labs  Lab 09/14/17 0448  INR 1.14   Cardiac Enzymes: No results for input(s): CKTOTAL, CKMB, CKMBINDEX, TROPONINI in the last 168 hours. BNP (last 3 results) No results for input(s): PROBNP in the last 8760 hours. HbA1C: No results for input(s): HGBA1C in the last 72 hours. CBG: Recent Labs  Lab 09/20/2017 2156  GLUCAP 84   Lipid Profile: No results for input(s): CHOL, HDL, LDLCALC, TRIG, CHOLHDL, LDLDIRECT in the last 72 hours. Thyroid Function Tests: No results for input(s): TSH, T4TOTAL, FREET4, T3FREE, THYROIDAB in the last 72 hours. Anemia Panel: No results  for input(s): VITAMINB12, FOLATE, FERRITIN, TIBC, IRON, RETICCTPCT in the last 72 hours. Urine analysis:    Component Value Date/Time   COLORURINE AMBER (A) 09/14/2017 0122   APPEARANCEUR CLEAR 09/14/2017 0122   LABSPEC 1.013 09/14/2017 0122   PHURINE 5.0 09/14/2017 0122   GLUCOSEU NEGATIVE 09/14/2017 0122   HGBUR NEGATIVE 09/14/2017 0122   BILIRUBINUR SMALL (A) 09/14/2017 0122   KETONESUR NEGATIVE 09/14/2017 0122   PROTEINUR NEGATIVE 09/14/2017 0122   UROBILINOGEN 0.2 05/02/2008 1222   NITRITE NEGATIVE 09/14/2017 0122   LEUKOCYTESUR NEGATIVE 09/14/2017 0122   Sepsis Labs: @LABRCNTIP (procalcitonin:4,lacticidven:4)  ) Recent Results (from the past 240 hour(s))  MRSA PCR Screening     Status: None   Collection Time: 09/14/17  5:04 PM  Result Value Ref Range Status   MRSA by PCR NEGATIVE NEGATIVE Final    Comment:        The GeneXpert MRSA Assay (FDA approved for NASAL  specimens only), is one component of a comprehensive MRSA colonization surveillance program. It is not intended to diagnose MRSA infection nor to guide or monitor treatment for MRSA infections. Performed at [facility], [place] [address], [place], [place] [address]       Studies: No results found.  Scheduled Meds: . allopurinol  300 mg Oral Daily  . Chlorhexidine Gluconate Cloth  6 each Topical Daily  . cycloSPORINE  1 drop Both Eyes BID  . dicyclomine  20 mg Oral Q6H  . divalproex  750 mg Oral BID  . DULoxetine  30 mg Oral BID  . ferrous sulfate  325 mg Oral Q breakfast  . letrozole  2.5 mg Oral Daily  . loratadine  10 mg Oral Daily  . multivitamin with minerals  1 tablet Oral Daily  . olopatadine  1 drop Both Eyes BID  . pantoprazole  80 mg Oral BID  . potassium chloride  40 mEq Oral BID  . sodium chloride flush  10-40 mL Intracatheter Q12H  . sodium chloride flush  3 mL Intravenous Q12H  . venlafaxine XR  37.5 mg Oral Q breakfast    Continuous Infusions: . sodium chloride    . sodium  chloride    . amiodarone 30 mg/hr (09/15/2017 1200)  . furosemide Stopped (09/24/2017 1244)  . heparin 1,350 Units/hr (10/04/2017 1200)  . milrinone 0.125 mcg/kg/min (09/23/2017 1200)  . norepinephrine (LEVOPHED) Adult infusion 26.027 mcg/min (09/26/2017 1200)     LOS: 2 days     Alma Friendly, MD [facility]   If 7PM-7AM, please contact night-coverage [email or web address] Password [facility] 10/03/2017, 3:32 PM

## 2017-09-16 NOTE — Progress Notes (Signed)
Initial Nutrition Assessment  DOCUMENTATION CODES:   Morbid obesity  INTERVENTION:   Monitor for diet advancement and provide medically appropriate nutritional supplement  RD to follow plan of care  NUTRITION DIAGNOSIS:   Increased nutrient needs related to chronic illness as evidenced by estimated needs  GOAL:   Patient will meet greater than or equal to 90% of their needs  MONITOR:   Diet advancement, Weight trends, I & O's, Labs  REASON FOR ASSESSMENT:   Malnutrition Screening Tool   ASSESSMENT:   Pt with PMH of recurrent breast cancer s/p lumpectomy and XRT 12/18, NICM, CHF (EF 20-25%), HTN, GERD, DM presents with acute kidney failure. Pt with progressive shock and escalating inotropic requirements.  Discussed pt with RN. Pt's diet order NPO for high-risk TEE/cardioversion procedure.  Pt not appropriate to see at this time. Pt with poor prognosis, Palliative Care Team consulted.  Per weight records, pt's weight trending down unsure if r/t fluid status.   Labs reviewed; Na 127, Chloride 83, BUN 49, Albumin 2.9, Bilirubin 4.6, Indirect bilirubin 3.8, Total bili 8.4, K WNL (up from 3.3)  Medications reviewed; ferrous sulfate, Protonix, multivitamin, potassium chloride, Femara, Lasix, Levophed, Primacor   NUTRITION - FOCUSED PHYSICAL EXAM:  Unable to assess at this time  Diet Order:  Diet NPO time specified Diet NPO time specified  EDUCATION NEEDS:   Not appropriate for education at this time  Skin:  Skin Assessment: Reviewed RN Assessment  Last BM:  09/14/17  Height:   Ht Readings from Last 1 Encounters:  09/14/17 5\' 7"  (1.702 m)   Weight:   Wt Readings from Last 1 Encounters:  09/25/2017 264 lb 8.8 oz (120 kg)   Ideal Body Weight:  61.4 kg  BMI:  Body mass index is 41.43 kg/m.  Estimated Nutritional Needs:   Kcal:  2150-2350  Protein:  120-130 grams  Fluid:  urine output + 1000 mL  Parks Ranger, MS, RDN, LDN 09/14/2017 4:17 PM

## 2017-09-16 NOTE — Progress Notes (Signed)
   ADVANCED HF TEAM PROGRESS NOTE  Multiple discussions with patient and her family throughout the day regarding her tenuous prognosis.   She has deteriorated throughout the day and now with increased WOB and increase vasopressor needs with decreasing urine output.  TEE performed in anticipation of possible DC-CV to help augment cardiac output. However large LAA clot was found.   I have reviewed the options with the patient and her family numerous times today and have suggested comfort care to avoid ongoing suffering in the setting of end-stage HF with progressive deterioration in her clinical condition.   Currently patient and family want to continue aggressive care including Full Code.    Critical care time an additional 90+ minutes today in addition to time spent on morning roundsand her TEE.   Glori Bickers, MD  8:45 PM

## 2017-09-16 NOTE — Progress Notes (Addendum)
Advanced Heart Failure Rounding Note   Subjective:   Milrinone 0.125 mcg and norepi 25 mcg. On amio 30 mg per hour. Remains hypotensive.   Complaining fatigue and shortness of breath. Uncomfortable.   Objective:   Weight Range:  Vital Signs:   Temp:  [97.4 F (36.3 C)-98.3 F (36.8 C)] 98.3 F (36.8 C) (04/05 0734) Pulse Rate:  [43-110] 110 (04/05 0800) Resp:  [15-30] 30 (04/05 0800) BP: (52-133)/(29-113) 70/29 (04/05 0649) SpO2:  [90 %-100 %] 94 % (04/05 0800) Arterial Line BP: (74-108)/(49-74) 93/69 (04/05 0800) Weight:  [264 lb 8.8 oz (120 kg)] 264 lb 8.8 oz (120 kg) (04/05 0500) Last BM Date: 09/14/17  Weight change: Filed Weights   09/14/17 1700 09/15/17 0500 09/28/2017 0500  Weight: 266 lb 1.5 oz (120.7 kg) 263 lb 10.7 oz (119.6 kg) 264 lb 8.8 oz (120 kg)    Intake/Output:   Intake/Output Summary (Last 24 hours) at 09/15/2017 0904 Last data filed at 09/25/2017 0800 Gross per 24 hour  Intake 1808.35 ml  Output 2675 ml  Net -866.65 ml     Physical Exam: CVP 25 General:  Appears fatigued. Uncomfortable HEENT: normal Neck: supple. JVP to ear Carotids 2+ bilat; no bruits. No lymphadenopathy or thryomegaly appreciated. Cor: PMI laterally displaced. Tachy regular No rubs or murmurs. + S3. RV lift Lungs: clear decreased in the bases on 2 liters oxygen.  Abdomen: soft, nontender, + distended. + hepatomegaly. No bruits or masses. Good bowel sounds. Extremities: no cyanosis, clubbing, rash, edema. RLE cast. LLE 2+ L radial a line.  Cool Neuro: alert & orientedx3, cranial nerves grossly intact. moves all 4 extremities w/o difficulty. Affect flat   Telemetry:  Aflutter 110s. Personally reviewed. AFL 100-110 Personally reviewed    Labs: Basic Metabolic Panel: Recent Labs  Lab 09/21/2017 2312 09/14/17 0113 09/14/17 0448 09/15/17 0500 09/15/17 1736 09/30/2017 0245  NA 130*  --  135 131* 128* 127*  K >7.5* 3.1* 3.3* 3.3* 3.4* 3.9  CL 87*  --  90* 87* 86* 83*  CO2  29  --  28 28 27 27   GLUCOSE 74  --  91 117* 116* 129*  BUN 61*  --  60* 57* 52* 49*  CREATININE 3.01*  --  2.96* 2.15* 1.85* 1.72*  CALCIUM 9.3  --  9.5 9.5 9.4 9.4  MG  --   --   --  2.0  --   --     Liver Function Tests: Recent Labs  Lab 10/03/2017 2312 09/14/17 0448 09/17/2017 0245  AST 122* 49* 42*  ALT 18 31 25   ALKPHOS 135* 129* 133*  BILITOT 11.5* 9.9* 8.4*  PROT 6.8 7.3 7.4  ALBUMIN 3.0* 3.0* 2.9*   No results for input(s): LIPASE, AMYLASE in the last 168 hours. Recent Labs  Lab 09/14/17 0113  AMMONIA 39*    CBC: Recent Labs  Lab 10/01/2017 2312 09/15/17 0500 09/20/2017 0245  WBC 4.1 6.1 7.6  NEUTROABS 2.6  --   --   HGB 14.7 13.7 14.1  HCT 42.6 39.1 39.4  MCV 100.0 96.3 94.3  PLT 139* 156 175    Cardiac Enzymes: No results for input(s): CKTOTAL, CKMB, CKMBINDEX, TROPONINI in the last 168 hours.  BNP: BNP (last 3 results) Recent Labs    07/29/17 1148 09/14/17 0448  BNP 724.5* 542.4*    ProBNP (last 3 results) No results for input(s): PROBNP in the last 8760 hours.    Other results:  Imaging: US Renal  Result  Date: 09/14/2017 CLINICAL DATA:  Elevated creatinine EXAM: RENAL / URINARY TRACT ULTRASOUND COMPLETE COMPARISON:  None. FINDINGS: Right Kidney: Length: 9.6 cm. Echogenicity within normal limits. No mass or hydronephrosis visualized. Left Kidney: Length: 10.1 cm. Echogenicity within normal limits. No mass or hydronephrosis visualized. Bladder: Appears normal for degree of bladder distention. IMPRESSION: No acute abnormality noted. Electronically Signed   By: Inez Catalina M.D.   On: 09/14/2017 11:44   US Abdomen Limited  Result Date: 09/14/2017 CLINICAL DATA:  Diabetes.  Obesity.  Enlarged liver. EXAM: ULTRASOUND ABDOMEN LIMITED RIGHT UPPER QUADRANT COMPARISON:  CT earlier same day. FINDINGS: Gallbladder: Surgically absent. Common bile duct: Diameter: 6 mm common normal Liver: Enlarged liver showing increased echogenicity consistent with diffuse  steatosis. Portal vein is patent on color Doppler imaging with normal direction of blood flow towards the liver. IMPRESSION: Enlarged liver with increased echogenicity consistent with diffuse steatosis. Electronically Signed   By: Nelson Chimes M.D.   On: 09/14/2017 11:57   Dg Chest Port 1 View  Result Date: 09/14/2017 CLINICAL DATA:  Central line placement. EXAM: PORTABLE CHEST 1 VIEW COMPARISON:  Chest radiograph September 14, 2017 at 1020 hours FINDINGS: Interval placement of RIGHT internal jugular central venous catheter with distal tip projecting cavoatrial junction. Stable severe cardiomegaly and pulmonary vascular congestion. No pleural effusion or focal consolidation. No pneumothorax. Single lead RIGHT AICD. Surgical clips project in bilateral chest wall. IMPRESSION: RIGHT internal jugular central venous catheter distal tip projects at cavoatrial junction. No pneumothorax. Stable cardiomegaly and pulmonary vascular congestion. Electronically Signed   By: Elon Alas M.D.   On: 09/14/2017 19:47   Dg Chest Port 1 View  Result Date: 09/14/2017 CLINICAL DATA:  Congestive heart failure EXAM: PORTABLE CHEST 1 VIEW COMPARISON:  11/26/2014 FINDINGS: Progression of marked cardiac enlargement. Bibasilar atelectasis. Possible small left effusion. Negative for pulmonary edema. IMPRESSION: Marked cardiac enlargement.  Negative for edema. Bibasilar atelectasis left greater than right Electronically Signed   By: Franchot Gallo M.D.   On: 09/14/2017 10:43     Medications:     Scheduled Medications: . allopurinol  300 mg Oral Daily  . Chlorhexidine Gluconate Cloth  6 each Topical Daily  . cycloSPORINE  1 drop Both Eyes BID  . dicyclomine  20 mg Oral Q6H  . divalproex  750 mg Oral BID  . DULoxetine  30 mg Oral BID  . ferrous sulfate  325 mg Oral Q breakfast  . letrozole  2.5 mg Oral Daily  . loratadine  10 mg Oral Daily  . multivitamin with minerals  1 tablet Oral Daily  . olopatadine  1 drop Both Eyes  BID  . pantoprazole  80 mg Oral BID  . potassium chloride  40 mEq Oral BID  . sodium chloride flush  10-40 mL Intracatheter Q12H  . venlafaxine XR  37.5 mg Oral Q breakfast    Infusions: . sodium chloride    . amiodarone 30 mg/hr (09/26/2017 0800)  . furosemide Stopped (09/15/17 2359)  . heparin 1,350 Units/hr (09/17/2017 0800)  . milrinone 0.125 mcg/kg/min (10/08/2017 0800)  . norepinephrine (LEVOPHED) Adult infusion 24.96 mcg/min (10/09/2017 0800)    PRN Medications: Place/Maintain arterial line **AND** sodium chloride, acetaminophen **OR** acetaminophen, albuterol, gabapentin, ondansetron **OR** ondansetron (ZOFRAN) IV, oxyCODONE, sodium chloride flush, traZODone   Assessment:    47 y/o woman with h/o breast CA, morbid obesity, chronic systolic HF due to NICM and breast cancer admitted with cardiogenic shock and MSOF  Plan/Discussion:    1. 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 EF previously 20-25% - ECHO this admit 10% with severe RV dysfunction - suspect decompensation triggered by AFL -CO-OX 67%. On milrinone 0.125 and NE 25 mcg.  - CVP 25.  Continue 120 mg IV lasix three times daily and add 2.5 mg metolazone now.  - Continue dual inotropes.  - will eventually need DC-CV - no candidate for transplant or VAD due to recent breast CA and severe RV dysfunction  2. Acute on chronic systolic HF with biventricular dysfunction - as above. Low output and volume overloaded. See above.   3. AKI due to cardiorenal syndrome - Creatinine trending down.    4. Atrial flutter - likley cause of decompensation.  -Continue amio drip. Continue heparin - TEE/DC-CV when more stable   4. Shock liver - hopefully will improve with hemodynamic support - liver u/s reviewed notable for severe hepatic steatosis - recheck LFTs  5. H/o recurrent breast CA - s/p mastectomy. Finished XRT 12/18  6. DM2 - continue SSI  7. Conversion disorder  8.  Hyponatremia Sodium 127. Restrict free water.    Needs GOC. Consult Palliative Care.  Length of Stay: 2   Amy Clegg NP-C  09/17/2017, 9:04 AM  Advanced Heart Failure Team Pager [phone] (M-F; [place])  Please contact [facility] for night-coverage after hours (4p -7a ) and weekends on [email or web address]  Agree with above.   She has progressive shock with escalating inotropic requirements. Volume status worsening. Remains in AFL. Much more uncomfortable.  On exam  Uncomfortable JVP to ear Cor tachy irregular +s3 +RV lift Lungs basilar crackles Ab distended + liver edge down Ext 2+ edema cool  She has progressive shock. Now end-stage. Not candidate for VAD due to RV dysfunction and not transplant candidate with breast CA. Will titrate inotropes. Continue attempts at diuresis. Consider high-risk TEE/DC-CV to see if restoring NSR will help. Agree with palliative care consult as she likely will not survive this admission.   CRITICAL CARE Performed by: Glori Bickers  Total critical care time: 45 minutes  Critical care time was exclusive of separately billable procedures and treating other patients.  Critical care was necessary to treat or prevent imminent or life-threatening deterioration.  Critical care was time spent personally by me (independent of midlevel providers or residents) on the following activities: development of treatment plan with patient and/or surrogate as well as nursing, discussions with consultants, evaluation of patient's response to treatment, examination of patient, obtaining history from patient or surrogate, ordering and performing treatments and interventions, ordering and review of laboratory studies, ordering and review of radiographic studies, pulse oximetry and re-evaluation of patient's condition.  Glori Bickers, MD  10:17 AM

## 2017-09-16 NOTE — Progress Notes (Signed)
RT at bedside for TEE procedure. Pt placed on 5L [place]. VS remained within normal limits throughout procedure. Pt with upper airway wheeze post procedure, Xopenex HHN given with improvement. RT will continue to closely monitor pt

## 2017-09-17 DIAGNOSIS — J9601 Acute respiratory failure with hypoxia: Secondary | ICD-10-CM

## 2017-09-17 DIAGNOSIS — J45901 Unspecified asthma with (acute) exacerbation: Secondary | ICD-10-CM

## 2017-09-17 DIAGNOSIS — R570 Cardiogenic shock: Principal | ICD-10-CM

## 2017-09-17 DIAGNOSIS — I5021 Acute systolic (congestive) heart failure: Secondary | ICD-10-CM

## 2017-09-17 DIAGNOSIS — N179 Acute kidney failure, unspecified: Secondary | ICD-10-CM

## 2017-09-17 DIAGNOSIS — Z7189 Other specified counseling: Secondary | ICD-10-CM

## 2017-09-17 DIAGNOSIS — Z515 Encounter for palliative care: Secondary | ICD-10-CM

## 2017-09-17 DIAGNOSIS — R0602 Shortness of breath: Secondary | ICD-10-CM

## 2017-09-17 DIAGNOSIS — J810 Acute pulmonary edema: Secondary | ICD-10-CM

## 2017-09-17 LAB — GLUCOSE, CAPILLARY
Glucose-Capillary: 170 mg/dL — ABNORMAL HIGH (ref 65–99)
Glucose-Capillary: 121 mg/dL — ABNORMAL HIGH (ref 65–99)
Glucose-Capillary: 128 mg/dL — ABNORMAL HIGH (ref 65–99)

## 2017-09-17 LAB — CBC WITH DIFFERENTIAL/PLATELET
BASOS PCT: 0 %
Basophils Absolute: 0 10*3/uL (ref 0.0–0.1)
EOS ABS: 0 10*3/uL (ref 0.0–0.7)
Eosinophils Relative: 0 %
HCT: 44.9 % (ref 36.0–46.0)
Hemoglobin: 16.4 g/dL — ABNORMAL HIGH (ref 12.0–15.0)
Lymphocytes Relative: 14 %
Lymphs Abs: 1.7 10*3/uL (ref 0.7–4.0)
MCH: 35 pg — ABNORMAL HIGH (ref 26.0–34.0)
MCHC: 36.5 g/dL — AB (ref 30.0–36.0)
MCV: 95.9 fL (ref 78.0–100.0)
MONO ABS: 1.1 10*3/uL — AB (ref 0.1–1.0)
Monocytes Relative: 9 %
NEUTROS ABS: 9.1 10*3/uL — AB (ref 1.7–7.7)
NEUTROS PCT: 77 %
PLATELETS: 202 10*3/uL (ref 150–400)
RBC: 4.68 MIL/uL (ref 3.87–5.11)
RDW: 21.2 % — ABNORMAL HIGH (ref 11.5–15.5)
WBC: 11.9 10*3/uL — ABNORMAL HIGH (ref 4.0–10.5)

## 2017-09-17 LAB — CORTISOL: CORTISOL PLASMA: 34.2 ug/dL

## 2017-09-17 LAB — CBC
HEMATOCRIT: 46.2 % — AB (ref 36.0–46.0)
HEMOGLOBIN: 16.4 g/dL — AB (ref 12.0–15.0)
MCH: 34 pg (ref 26.0–34.0)
MCHC: 35.5 g/dL (ref 30.0–36.0)
MCV: 95.9 fL (ref 78.0–100.0)
Platelets: 226 10*3/uL (ref 150–400)
RBC: 4.82 MIL/uL (ref 3.87–5.11)
RDW: 21.1 % — ABNORMAL HIGH (ref 11.5–15.5)
WBC: 12.9 10*3/uL — ABNORMAL HIGH (ref 4.0–10.5)

## 2017-09-17 LAB — COMPREHENSIVE METABOLIC PANEL
ALT: 25 U/L (ref 14–54)
AST: 46 U/L — AB (ref 15–41)
Albumin: 2.9 g/dL — ABNORMAL LOW (ref 3.5–5.0)
Alkaline Phosphatase: 144 U/L — ABNORMAL HIGH (ref 38–126)
Anion gap: 21 — ABNORMAL HIGH (ref 5–15)
BILIRUBIN TOTAL: 8.5 mg/dL — AB (ref 0.3–1.2)
BUN: 49 mg/dL — AB (ref 6–20)
CO2: 21 mmol/L — ABNORMAL LOW (ref 22–32)
Calcium: 9.9 mg/dL (ref 8.9–10.3)
Chloride: 86 mmol/L — ABNORMAL LOW (ref 101–111)
Creatinine, Ser: 2.03 mg/dL — ABNORMAL HIGH (ref 0.44–1.00)
GFR, EST AFRICAN AMERICAN: 33 mL/min — AB (ref >60)
GFR, EST NON AFRICAN AMERICAN: 28 mL/min — AB (ref >60)
Glucose, Bld: 134 mg/dL — ABNORMAL HIGH (ref 65–99)
POTASSIUM: 4.6 mmol/L (ref 3.5–5.1)
Sodium: 128 mmol/L — ABNORMAL LOW (ref 135–145)
TOTAL PROTEIN: 7.6 g/dL (ref 6.5–8.1)

## 2017-09-17 LAB — BASIC METABOLIC PANEL
ANION GAP: 21 — AB (ref 5–15)
BUN: 50 mg/dL — ABNORMAL HIGH (ref 6–20)
CHLORIDE: 85 mmol/L — AB (ref 101–111)
CO2: 22 mmol/L (ref 22–32)
Calcium: 9.8 mg/dL (ref 8.9–10.3)
Creatinine, Ser: 2.15 mg/dL — ABNORMAL HIGH (ref 0.44–1.00)
GFR calc non Af Amer: 26 mL/min — ABNORMAL LOW (ref >60)
GFR, EST AFRICAN AMERICAN: 30 mL/min — AB (ref >60)
Glucose, Bld: 138 mg/dL — ABNORMAL HIGH (ref 65–99)
POTASSIUM: 4.6 mmol/L (ref 3.5–5.1)
SODIUM: 128 mmol/L — AB (ref 135–145)

## 2017-09-17 LAB — HEPARIN LEVEL (UNFRACTIONATED): HEPARIN UNFRACTIONATED: 0.51 [IU]/mL (ref 0.30–0.70)

## 2017-09-17 LAB — MAGNESIUM: MAGNESIUM: 1.7 mg/dL (ref 1.7–2.4)

## 2017-09-17 LAB — POCT I-STAT 3, ART BLOOD GAS (G3+)
Acid-Base Excess: 1 mmol/L (ref 0.0–2.0)
BICARBONATE: 24.6 mmol/L (ref 20.0–28.0)
O2 Saturation: 91 %
PH ART: 7.457 — AB (ref 7.350–7.450)
TCO2: 26 mmol/L (ref 22–32)
pCO2 arterial: 34.9 mmHg (ref 32.0–48.0)
pO2, Arterial: 58 mmHg — ABNORMAL LOW (ref 83.0–108.0)

## 2017-09-17 LAB — PHOSPHORUS: PHOSPHORUS: 3.9 mg/dL (ref 2.5–4.6)

## 2017-09-17 LAB — COOXEMETRY PANEL
Carboxyhemoglobin: 2 % — ABNORMAL HIGH (ref 0.5–1.5)
Methemoglobin: 0.6 % (ref 0.0–1.5)
O2 Saturation: 89.3 %
TOTAL HEMOGLOBIN: 17 g/dL — AB (ref 12.0–16.0)

## 2017-09-17 LAB — LACTIC ACID, PLASMA: LACTIC ACID, VENOUS: 5.1 mmol/L — AB (ref 0.5–1.9)

## 2017-09-17 LAB — PROCALCITONIN: Procalcitonin: 3.53 ng/mL

## 2017-09-17 LAB — TROPONIN I: TROPONIN I: 0.12 ng/mL — AB (ref <0.03)

## 2017-09-17 MED ORDER — GLYCOPYRROLATE 0.2 MG/ML IJ SOLN
0.2000 mg | INTRAMUSCULAR | Status: DC | PRN
  Administered 2017-09-17 (×2): 0.2 mg via INTRAVENOUS
  Filled 2017-09-17 (×2): qty 1
  Filled 2017-09-17: qty 2

## 2017-09-17 MED ORDER — MORPHINE BOLUS VIA INFUSION
2.0000 mg | INTRAVENOUS | Status: DC | PRN
  Administered 2017-09-17: 4 mg via INTRAVENOUS
  Administered 2017-09-17: 2 mg via INTRAVENOUS
  Administered 2017-09-17: 5 mg via INTRAVENOUS
  Filled 2017-09-17: qty 4

## 2017-09-17 MED ORDER — LORAZEPAM 2 MG/ML IJ SOLN
0.5000 mg | Freq: Four times a day (QID) | INTRAMUSCULAR | Status: DC
  Administered 2017-09-17: 0.5 mg via INTRAVENOUS
  Filled 2017-09-17: qty 1

## 2017-09-17 MED ORDER — LEVALBUTEROL HCL 0.63 MG/3ML IN NEBU
0.6300 mg | INHALATION_SOLUTION | Freq: Four times a day (QID) | RESPIRATORY_TRACT | Status: DC
  Administered 2017-09-17 (×3): 0.63 mg via RESPIRATORY_TRACT
  Filled 2017-09-17 (×4): qty 3

## 2017-09-17 MED ORDER — PANTOPRAZOLE SODIUM 40 MG IV SOLR
40.0000 mg | INTRAVENOUS | Status: DC
  Administered 2017-09-17: 40 mg via INTRAVENOUS
  Filled 2017-09-17: qty 40

## 2017-09-17 MED ORDER — MORPHINE BOLUS VIA INFUSION
4.0000 mg | INTRAVENOUS | Status: DC | PRN
  Administered 2017-09-17: 4 mg via INTRAVENOUS
  Filled 2017-09-17: qty 6

## 2017-09-17 MED ORDER — WHITE PETROLATUM EX OINT
TOPICAL_OINTMENT | CUTANEOUS | Status: AC
  Administered 2017-09-17: 08:00:00
  Filled 2017-09-17: qty 28.35

## 2017-09-17 MED ORDER — INSULIN ASPART 100 UNIT/ML ~~LOC~~ SOLN
2.0000 [IU] | SUBCUTANEOUS | Status: DC
  Administered 2017-09-17: 4 [IU] via SUBCUTANEOUS
  Administered 2017-09-17 (×2): 2 [IU] via SUBCUTANEOUS

## 2017-09-17 MED ORDER — MORPHINE 100MG IN NS 100ML (1MG/ML) PREMIX INFUSION
5.0000 mg/h | INTRAVENOUS | Status: DC
  Administered 2017-09-17: 12 mg/h via INTRAVENOUS
  Administered 2017-09-17: 2 mg/h via INTRAVENOUS
  Administered 2017-09-18: 12 mg/h via INTRAVENOUS
  Filled 2017-09-17 (×3): qty 100

## 2017-09-17 MED ORDER — METHYLPREDNISOLONE SODIUM SUCC 125 MG IJ SOLR
60.0000 mg | Freq: Four times a day (QID) | INTRAMUSCULAR | Status: DC
  Administered 2017-09-17 (×3): 60 mg via INTRAVENOUS
  Filled 2017-09-17 (×3): qty 2

## 2017-09-17 MED ORDER — IPRATROPIUM BROMIDE 0.02 % IN SOLN
0.5000 mg | Freq: Four times a day (QID) | RESPIRATORY_TRACT | Status: DC
  Administered 2017-09-17 (×3): 0.5 mg via RESPIRATORY_TRACT
  Filled 2017-09-17 (×4): qty 2.5

## 2017-09-17 MED ORDER — LORAZEPAM 2 MG/ML IJ SOLN: 0.5000 mg | INTRAMUSCULAR | Status: DC | PRN

## 2017-09-17 MED ORDER — MIDAZOLAM HCL 2 MG/2ML IJ SOLN
2.0000 mg | INTRAMUSCULAR | Status: DC | PRN
  Administered 2017-09-17: 1 mg via INTRAVENOUS
  Filled 2017-09-17: qty 2

## 2017-09-17 MED ORDER — GLYCOPYRROLATE 0.2 MG/ML IJ SOLN
0.4000 mg | INTRAMUSCULAR | Status: DC
  Administered 2017-09-17 (×2): 0.4 mg via INTRAVENOUS
  Filled 2017-09-17: qty 2

## 2017-09-17 NOTE — Progress Notes (Addendum)
PROGRESS NOTE  [name] VPX:[id number] DOB: [birth year] DOA: 10/04/2017 PCP: Mellody Dance, DO  HPI/Recap of past 24 hours: [name] is a 47 y.o. female with medical history significant of NICM, EF 20-25%, AICD, fibromyalgia, conversion disorder.  Patient presents to [facility] ED with c/o fatigue, generalized weakness, confusion, feeling unsteady, and falls, which has been ongoing for the past month or more.  She stopped her meds 2 days ago to see if this would help, but didn't really help.  Recent fall with fracture of right lower extremity.  In the ED, Bili 11 up from 1.0.  Creat 3 up from 1.  K initially read as >7.5 however was hemolyzed, on repeat K is 3.1. EKG showed A-flutter which is ?new. CT abd/pelvis showed severe cardiomegaly with hepatic steatosis. Pt was noted to be hypotensive with SBP in the 70s. Cardiology was consulted and pt was transferred to [facility] ICU with cardiology the primary attending. Pt is currently being managed for cardiogenic shock with multi system failure. TRH was asked to co-manage.  Overnight patient was noted to have increased work of breathing, with worsening clinical status, significant volume overload despite diuretics.  Patient was eventually made DNR and was placed on morphine drip. Today, patient responding to voice and touch.  Very lethargic, very poor prognosis.    Assessment/Plan: Principal Problem:   Acute kidney failure (HCC) Active Problems:   Chronic systolic CHF (congestive heart failure) (HCC)   Hypotension   Conversion disorder   ICD in place (MDT 2013)   Diabetes mellitus, type II ([place])   Jaundice   Acute systolic heart failure (HCC)  Cardiogenic shock/NICM Worsening, very poor prognosis History NICM from ?chemotherapy, decompensated by new atrial flutter ECHO: EF of 20-25%, with diffuse hypokinesis, unable to determine diastolic function due to atrial flutter Cath showed nonobstructive CAD in 2012 Cardiology on board: Discontinue all  pressors Not a candidate for transplant or LVAD due to recent breast CA and severe RV dysfunction Due to worsening symptoms, palliative team has been consulted as prognosis is very poor, awaiting official note Started morphine drip, Ativan, robinul  Acute on chronic decompensated systolic heart failure s/p AICD Biventricular dysfunction Discontinue all pressors  Atrial flutter Heart rates uncontrolled TEE showed large LAA thrombus Continue amiodarone drip  AKI Worsening Baseline creatinine WNL, 3 on presentation Likely due to cardiorenal syndrome Renal ultrasound, no hydronephrosis  Shock liver Elevated transaminitis, likely due to hepatic congestion Liver ultrasound, hepatic steatosis  Diabetes mellitus type 2 A1c on 1/19 showed 5.6 Diet control Continue CBGs  Conversion disorder/fibromyalgia Discontinue Cymbalta, venlafaxine  Recurrent breast CA Recently diagnosed with recurrent Breast CA.Underwentlumpectomy, with clean margins.Completed XRTat end of 12/18 Discontinue letrozole   HOSPITALIST TEAM/TRH, WILL SIGN OFF     Code Status: DNR  Family Communication: Discussed with husband at bedside  Disposition Plan: Poor prognosis, palliative team consulted   Consultants:  TRH  Procedures:  Arterial line placement  Antimicrobials:  None   DVT prophylaxis: None   Objective: Vitals:   09/17/17 1100 09/17/17 1200 09/17/17 1210 09/17/17 1300  BP:      Pulse: (!) 107 (!) 114  (!) 111  Resp: 15 15  13   Temp:      TempSrc:   Axillary   SpO2: (!) 86% (!) 88%  90%  Weight:      Height:        Intake/Output Summary (Last 24 hours) at 09/17/2017 1357 Last data filed at 09/17/2017 1300 Gross per 24 hour  Intake 1917 ml  Output 1100 ml  Net 817 ml   Filed Weights   09/14/17 1700 09/15/17 0500 09/25/2017 0500  Weight: 120.7 kg (266 lb 1.5 oz) 119.6 kg (263 lb 10.7 oz) 120 kg (264 lb 8.8 oz)    Exam:   General: Very ill-appearing, very  lethargic  Cardiovascular: S1, S2 present, irregular, +JVP  Respiratory: Crackles noted bilaterally  Abdomen: Obese, soft, non-tender, non-distended  Musculoskeletal: 2+ edema, RLE cast  Skin: Cool to touch  Psychiatry: Unable to assess   Data Reviewed: CBC: Recent Labs  Lab 09/20/2017 2312 09/15/17 0500 09/19/2017 0245 09/17/17 0030 09/17/17 0522  WBC 4.1 6.1 7.6 11.9* 12.9*  NEUTROABS 2.6  --   --  9.1*  --   HGB 14.7 13.7 14.1 16.4* 16.4*  HCT 42.6 39.1 39.4 44.9 46.2*  MCV 100.0 96.3 94.3 95.9 95.9  PLT 139* 156 175 202 160   Basic Metabolic Panel: Recent Labs  Lab 09/15/17 0500 09/15/17 1736 09/17/2017 0245 10/09/2017 1643 09/17/17 0030 09/17/17 0522  NA 131* 128* 127* 128* 128* 128*  K 3.3* 3.4* 3.9 4.7 4.6 4.6  CL 87* 86* 83* 85* 86* 85*  CO2 28 27 27 24  21* 22  GLUCOSE 117* 116* 129* 126* 134* 138*  BUN 57* 52* 49* 49* 49* 50*  CREATININE 2.15* 1.85* 1.72* 1.84* 2.03* 2.15*  CALCIUM 9.5 9.4 9.4 9.7 9.9 9.8  MG 2.0  --   --   --  1.7  --   PHOS  --   --   --   --  3.9  --    GFR: Estimated Creatinine Clearance: 43.4 mL/min (A) (by C-G formula based on SCr of 2.15 mg/dL (H)). Liver Function Tests: Recent Labs  Lab 09/20/2017 2312 09/14/17 0448 10/09/2017 0245 09/17/17 0030  AST 122* 49* 42* 46*  ALT 18 31 25 25   ALKPHOS 135* 129* 133* 144*  BILITOT 11.5* 9.9* 8.4* 8.5*  PROT 6.8 7.3 7.4 7.6  ALBUMIN 3.0* 3.0* 2.9* 2.9*   No results for input(s): LIPASE, AMYLASE in the last 168 hours. Recent Labs  Lab 09/14/17 0113  AMMONIA 39*   Coagulation Profile: Recent Labs  Lab 09/14/17 0448  INR 1.14   Cardiac Enzymes: Recent Labs  Lab 09/17/17 0030  TROPONINI 0.12*   BNP (last 3 results) No results for input(s): PROBNP in the last 8760 hours. HbA1C: No results for input(s): HGBA1C in the last 72 hours. CBG: Recent Labs  Lab 09/23/2017 2156 09/17/17 0429 09/17/17 0746 09/17/17 1130  GLUCAP 84 170* 121* 128*   Lipid Profile: No results  for input(s): CHOL, HDL, LDLCALC, TRIG, CHOLHDL, LDLDIRECT in the last 72 hours. Thyroid Function Tests: No results for input(s): TSH, T4TOTAL, FREET4, T3FREE, THYROIDAB in the last 72 hours. Anemia Panel: No results for input(s): VITAMINB12, FOLATE, FERRITIN, TIBC, IRON, RETICCTPCT in the last 72 hours. Urine analysis:    Component Value Date/Time   COLORURINE AMBER (A) 09/14/2017 0122   APPEARANCEUR CLEAR 09/14/2017 0122   LABSPEC 1.013 09/14/2017 0122   PHURINE 5.0 09/14/2017 0122   GLUCOSEU NEGATIVE 09/14/2017 0122   HGBUR NEGATIVE 09/14/2017 0122   BILIRUBINUR SMALL (A) 09/14/2017 0122   KETONESUR NEGATIVE 09/14/2017 0122   PROTEINUR NEGATIVE 09/14/2017 0122   UROBILINOGEN 0.2 05/02/2008 1222   NITRITE NEGATIVE 09/14/2017 0122   LEUKOCYTESUR NEGATIVE 09/14/2017 0122   Sepsis Labs: @LABRCNTIP (procalcitonin:4,lacticidven:4)  ) Recent Results (from the past 240 hour(s))  MRSA PCR Screening     Status: None  Collection Time: 09/14/17  5:04 PM  Result Value Ref Range Status   MRSA by PCR NEGATIVE NEGATIVE Final    Comment:        The GeneXpert MRSA Assay (FDA approved for NASAL specimens only), is one component of a comprehensive MRSA colonization surveillance program. It is not intended to diagnose MRSA infection nor to guide or monitor treatment for MRSA infections. Performed at [facility], [address] [place], [place] [address]       Studies: Dg Chest 1 View  Result Date: 09/17/2017 CLINICAL DATA:  Worsening dyspnea EXAM: CHEST  1 VIEW COMPARISON:  09/14/2017 FINDINGS: Cardiomegaly with worsening bilateral airspace opacities consistent with CHF. Superimposed pneumonia would be difficult to entirely exclude. Right IJ central line catheter tip terminates in the distal SVC-cavoatrial juncture. Right ventricular ICD lead is noted with ICD device projecting over the right mid hemithorax. Axillary clips are seen on the right. No acute osseous abnormality.  IMPRESSION: Stable cardiomegaly. Progression of bilateral airspace opacities suspicious for evolving pulmonary edema. Superimposed pneumonia would be difficult to entirely exclude. Electronically Signed   By: Ashley Royalty M.D.   On: 09/17/2017 01:13    Scheduled Meds: . Chlorhexidine Gluconate Cloth  6 each Topical Daily  . cycloSPORINE  1 drop Both Eyes BID  . divalproex  750 mg Oral BID  . insulin aspart  2-6 Units Subcutaneous Q4H  . ipratropium  0.5 mg Nebulization Q6H  . levalbuterol  0.63 mg Nebulization Q6H  . olopatadine  1 drop Both Eyes BID    Continuous Infusions: . sodium chloride    . amiodarone 30 mg/hr (09/17/17 1300)  . morphine 8 mg/hr (09/17/17 1300)     LOS: 3 days     Alma Friendly, MD [facility]   If 7PM-7AM, please contact night-coverage [email or web address] Password [email or web address] 09/17/2017, 1:57 PM

## 2017-09-17 NOTE — Progress Notes (Signed)
ANTICOAGULATION CONSULT NOTE - Follow Up Consult  Pharmacy Consult for Heparin Indication: atrial fibrillation  Allergies  Allergen Reactions  . Ace Inhibitors Other (See Comments)    hyperkalemia  . Reglan [Metoclopramide Hcl] Anxiety  . Adhesive [Tape] Itching and Rash    Specifically tegaderm, normal adhesive tape is ok   Patient Measurements: Height: 5\' 7"  (170.2 cm) Weight: 264 lb 8.8 oz (120 kg) IBW/kg (Calculated) : 61.6 Heparin Dosing Weight: 90.1 kg  Vital Signs: Temp: 97.7 F (36.5 C) (04/06 0743) Temp Source: Oral (04/06 0743) BP: 89/73 (04/06 0733) Pulse Rate: 116 (04/06 0733)  Labs: Recent Labs    09/15/17 1430  09/24/2017 0245 09/26/2017 1643 09/17/17 0030 09/17/17 0522  HGB  --   --  14.1  --  16.4* 16.4*  HCT  --   --  39.4  --  44.9 46.2*  PLT  --   --  175  --  202 226  HEPARINUNFRC 0.66  --  0.62  --   --  0.51  CREATININE  --    < > 1.72* 1.84* 2.03* 2.15*  TROPONINI  --   --   --   --  0.12*  --    < > = values in this interval not displayed.    Estimated Creatinine Clearance: 43.4 mL/min (A) (by C-G formula based on SCr of 2.15 mg/dL (H)).   Medications:  Infusions:  . sodium chloride    . sodium chloride    . amiodarone 30 mg/hr (09/17/17 0700)  . furosemide (LASIX) infusion 10 mg/hr (09/17/17 0700)  . heparin 1,350 Units/hr (09/17/17 0700)  . milrinone 0.125 mcg/kg/min (09/17/17 0700)  . morphine 2 mg/hr (09/17/17 0700)  . norepinephrine (LEVOPHED) Adult infusion 34 mcg/min (09/17/17 0700)    Assessment: 47 year old female admitted from [facility] 09/14/17 in cardiogenic shock and new atrial flutter on IV heparin.  Heparin level  within therapeutic range at 0.51 on drip rate 1350 uts/hr, no bleeding issues noted.   Goal of Therapy:  Heparin level 0.3-0.7 units/ml Monitor platelets by anticoagulation protocol: Yes   Plan:  Continue heparin to 1350 units/hr Daily  HL, CBC  Bonnita Nasuti Pharm.D. CPP, BCPS Clinical  Pharmacist [phone] 09/17/2017 8:08 AM

## 2017-09-17 NOTE — Progress Notes (Addendum)
Was called to the bedside for patient's worsening clinical status with increased O2 requirements (12L [place]) associated with increased work of breathing, minimal urine output with the lasix IV with < 100cc out. Given the above we consulted CCM for assistance. Additionally, on exam she is arousable, but appears quite uncomfortable on 34 of levophed with inotropic support but remains cool with significantly elevated CVP 26 and low crackles/JVD on exam. Will obtain cxr and abg. Discussed with the family again about patient's poor prognosis, but family requests to continue to proceed with aggressive measure and would like to continue to keep the patient full code.   Very poor prognosis.  Willeen Cass Cardiology fellow

## 2017-09-17 NOTE — Consult Note (Signed)
Consultation Note Date: 09/17/2017   Patient Name: [name]  DOB: May 28, 1971  MRN: [id number]  Age / Sex: 47 y.o., female  PCP: Mellody Dance, DO Referring Physician: Sanda Klein, MD  Reason for Consultation: Establishing goals of care, Non pain symptom management, Pain control, Psychosocial/spiritual support and Terminal Care  HPI/Patient Profile: 47 y.o. 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 She was in cardiogenic shock, fluid overload, new aflutter.  Pt found to have AKI with creatinine of 3, K+ greater than 7.5, Bili 11. Per TEE, large clot in LA. Pt is not a candidate for cardioversion 2/2 large clot. Pt acutely decompensated overnight with increased work of breathing. She is now a DNR/DNI. She was started on MS04 basal only gtt 1-10 mg/hr.  Consult ordered for EOL care assistance, GOC  Clinical Assessment and Goals of Care: Met with pt, pt's husband is at the bedside, chart reviewed. Pt appears to be transitioning/active towards EOL. She remains on levo 38 mcg/hr , no plan to escalate, amiodarone gtt, as well as milrinone. She will occasionally open her eyes to voice, moan but otherwise not communicating. Her extremities are cold. She is mottled. She did moan and was able to shake her head that she was I pain but could not specify where  Pt at this point is unable to speak for herself. Her husband, [name], is her healthcare proxy. They have a 8 yo daughter together. Both of pt's parents are living and her mother lives with them  I did inform husband that if things cont as they have overnight I would not be surprised if she had a prognosis of hours to days despite all  aggressive interventions. Mr. Kantner shared that he was surprised that she lived through the night. Also, discussed that advanced cardiac interventions such as pressors, milrinone, amiodarone unfortunately will not make her better, but may prolong things which can also be a difficult place    SUMMARY OF RECOMMENDATIONS   Confirm DNR/DNI De-activate defibrillator ( discussed with husband and Dr. Haroldine Laws) Cont with amiodarone, levo, milrinone for now as other family members are arriving. Plan is not to escalate dosing at this point No BIPAP  Addendum 1410: Dr. Haroldine Laws in to see pt and family. Milrinone, levo, heparin stopped. She cont on amiodarone. Family at the bedside and prepared for EOL within hours to a day.  Will DC labs, CBG's for pt comfort.  Code Status/Advance Care Planning:  DNR    Symptom Management:   Pain: Cont with MS04 gtt. Will add bolus, 2-'4mg'$  q20 min PRN. Monitor and titrate for effect  Dyspnea: Cont with venti mask as tolerated. Primary mgt with MS04 gtt. Please up basal rate if 3 or more boluses needed within the hour or if pt acutely decompensates. Will add emergency versed order. DO not escalate to BIPAP  Addendum  1410: Respirations more agonal as well as secretions forming despite PRN robinul.  Will uptitrate MS04 gtt 5-15/hr and 4-6 mg q20 min PRN  Secretions: Start scheduled robinul 0.'4mg'$  q4 ATC and 0.2-0.4 q4 PRN  Palliative Prophylaxis:   Aspiration, Delirium Protocol, Eye Care, Frequent Pain Assessment, Oral Care and Turn Reposition  Additional Recommendations (Limitations, Scope, Preferences):  Minimize Medications, No Chemotherapy, No Radiation, No Surgical Procedures and No Tracheostomy  Psycho-social/Spiritual:   Desire for further Chaplaincy support:yes  Additional Recommendations: Grief/Bereavement Support  Prognosis:   Hours - Days  Discharge Planning: Anticipated Hospital Death      Primary Diagnoses: Present on Admission: .  Acute kidney failure ([place]) . ICD in place (MDT 2013) . Jaundice . Chronic systolic CHF (congestive heart failure) ([place]) . Conversion disorder . Hypotension . Acute systolic heart failure ([place])   I have reviewed the medical record, interviewed the patient and family, and examined the patient. The following aspects are pertinent.  Past Medical History:  Diagnosis Date  . Acne   . AICD (automatic cardioverter/defibrillator) present   . Anemia 1980's   [id number]  . Anginal pain ([place])    sporatic , been going on for yrs,  . Anxiety   . Arthritis   . 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 Blood transfusion 1980's   1987 or 1988  . Breast calcifications    right breast  . Breast cancer ([facility])    right;  . CHF (congestive heart failure) ([place])    due to non-ischemic cardiomyopathy, thought to be chemotherapy induced;  cath 7/12: normal cors, EF 20-25%. Cardiac MRI 05/2011 EF 32%. ICD implantation 10/2011 ([employer])  . 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 Conversion disorder   . 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 Depression   . Endometriosis   . Family history of colon cancer   . Family history of ovarian cancer   . Fibromyalgia   . GERD (gastroesophageal reflux disease)   . Headache   . Hepatomegaly   . History of radiation therapy 05/09/17- 06/08/2017   Left Breast treated to 40.05 Gy in 15 fractions of 2.67 Gy, followed by a 10 gy boost in 5 fractions to yield a total dose of 50.05 Gy to the Left Breast  . History of stomach ulcers   . Hyperlipidemia   . Hypertension    c/b orthostatic hypotention  . ICD (implantable cardiac defibrillator) in place   . Insomnia   . Interstitial cystitis   . Left eye injury 12/2014  . 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   . Nonischemic cardiomyopathy ([place])    related to chemo; EF 30% 05/2011  . Obesity   . Palpitation    normal sinus rhythm only on 21 day heart  monitor  . Panic attacks   . Peripheral neuropathy    chemo- induced  . Pneumonia 2000's   "once"  . Seasonal allergies   . Seizures ([place])    last seizure  last week: non elipsey seisure  . Type II diabetes mellitus ([place])    Social History   Socioeconomic History  . Marital status: Married    Spouse name: [name]  . Number of children: 1  . Years of education: 32  . Highest education level: Not on file  Occupational History  . Occupation: Disablity  Social Needs  . Financial resource strain: Not on file  . Food insecurity:    Worry: Not on file  Inability: Not on file  . Transportation needs:    Medical: Not on file    Non-medical: Not on file  Tobacco Use  . Smoking status: Never Smoker  . Smokeless tobacco: Never Used  Substance and Sexual Activity  . Alcohol use: Yes    Comment: social drinker   . Drug use: No  . Sexual activity: Not on file  Lifestyle  . Physical activity:    Days per week: Not on file    Minutes per session: Not on file  . Stress: Not on file  Relationships  . Social connections:    Talks on phone: Not on file    Gets together: Not on file    Attends religious service: Not on file    Active member of club or organization: Not on file    Attends meetings of clubs or organizations: Not on file    Relationship status: Not on file  Other Topics Concern  . Not on file  Social History Narrative   Patient is married [name]) and lives at home with her family.   Patient has one child. A daughter   Patient is right-handed.   Patient has a college education.   Patient drinks some caffeine occasionally, but not everyday.   Regular exercise   She is disabled   02/23/2016   Updated   Family History  Problem Relation Age of Onset  . Heart disease Mother   . Arthritis Mother   . Hypertension Mother   . Stroke Mother   . Fibromyalgia Mother   . Coronary artery disease Unknown   . Heart attack Unknown   . Heart disease Maternal Uncle   .  Colon cancer Paternal Uncle        dx over 87  . Heart disease Maternal Grandmother   . Heart attack Maternal Grandmother   . Ovarian cancer Paternal Grandmother   . Fibromyalgia Sister   . Ovarian cancer Paternal Aunt    Scheduled Meds: . Chlorhexidine Gluconate Cloth  6 each Topical Daily  . cycloSPORINE  1 drop Both Eyes BID  . divalproex  750 mg Oral BID  . DULoxetine  30 mg Oral BID  . ferrous sulfate  325 mg Oral Q breakfast  . insulin aspart  2-6 Units Subcutaneous Q4H  . ipratropium  0.5 mg Nebulization Q6H  . letrozole  2.5 mg Oral Daily  . levalbuterol  0.63 mg Nebulization Q6H  . methylPREDNISolone (SOLU-MEDROL) injection  60 mg Intravenous Q6H  . olopatadine  1 drop Both Eyes BID  . pantoprazole (PROTONIX) IV  40 mg Intravenous Q24H  . sodium chloride flush  10-40 mL Intracatheter Q12H  . sodium chloride flush  3 mL Intravenous Q12H   Continuous Infusions: . sodium chloride    . sodium chloride    . amiodarone 30 mg/hr (09/17/17 0700)  . furosemide (LASIX) infusion 10 mg/hr (09/17/17 0700)  . heparin 1,350 Units/hr (09/17/17 0700)  . milrinone 0.125 mcg/kg/min (09/17/17 7681)  . morphine 2 mg/hr (09/17/17 0700)  . norepinephrine (LEVOPHED) Adult infusion 38 mcg/min (09/17/17 0828)   PRN Meds:.Place/Maintain arterial line **AND** sodium chloride, acetaminophen **OR** acetaminophen, gabapentin, LORazepam, morphine, ondansetron **OR** ondansetron (ZOFRAN) IV, sodium chloride flush, sodium chloride flush, traZODone Medications Prior to Admission:  Prior to Admission medications   Medication Sig Start Date End Date Taking? Authorizing Provider  acetaminophen (TYLENOL) 500 MG tablet Take 1,000 mg by mouth 2 (two) times daily as needed for moderate pain.   Yes [provider]  albuterol (PROVENTIL HFA;VENTOLIN HFA) 108 (90 Base) MCG/ACT inhaler Inhale 1-2 puffs into the lungs every 6 (six) hours as needed for wheezing or shortness of breath.    Yes [provider]  allopurinol (ZYLOPRIM) 300 MG tablet Take 300 mg by mouth daily. 03/22/17  Yes [provider]  baclofen (LIORESAL) 10 MG tablet TAKE 1 TABLET BY MOUTH 2 (TWO) TIMES DAILY AS NEEDED FOR MUSCLE SPASMS. Patient taking differently: TAKE 1 TABLET BY MOUTH 3 TIMES DAILY AS NEEDED FOR MUSCLE SPASMS. 08/14/15  Yes Calone, Ples Specter, FNP  BLACK CURRANT SEED OIL PO Take 15 mLs by mouth daily.    Yes [provider]  carvedilol (COREG) 25 MG tablet Take 0.5 tablets (12.5 mg total) by mouth 2 (two) times daily with a meal. 11/27/14  Yes Kilroy, Doreene Burke, PA-C  Cholecalciferol (VITAMIN D3) 5000 UNITS CAPS Take 5,000 Units by mouth daily.    Yes [provider]  Coenzyme Q10 (CO Q-10) 100 MG CAPS Take 100 mg by mouth daily.   Yes [provider]  Cyanocobalamin 2500 MCG SUBL Place 2,500 mcg under the tongue daily.   Yes [provider]  dicyclomine (BENTYL) 20 MG tablet Take 1 tablet (20 mg total) by mouth every 6 (six) hours. 08/04/17  Yes Gatha Mayer, MD  digoxin (LANOXIN) 0.125 MG tablet TAKE 0.5 TABLETS (0.0625 MG TOTAL) BY MOUTH DAILY. 06/29/17  Yes Bensimhon, Shaune Pascal, MD  divalproex (DEPAKOTE ER) 500 MG 24 hr tablet Take 1.5 tablets ('750mg'$ ) twice a day 07/06/17  Yes Dohmeier, Asencion Partridge, MD  DULoxetine (CYMBALTA) 30 MG capsule TAKE 1 CAPSULE BY MOUTH TWICE A DAY 07/25/17  Yes Dohmeier, Asencion Partridge, MD  ferrous sulfate 325 (65 FE) MG tablet Take 325 mg by mouth daily with breakfast.   Yes [provider]  gabapentin (NEURONTIN) 100 MG capsule Take 2 capsules by mouth 3 (three) times daily as needed (pain).  07/06/17  Yes [provider]  GLUCOSAMINE-CHONDROITIN PO Take 1,200 mg by mouth daily.   Yes [provider]  hyaluronate sodium (RADIAPLEXRX) GEL Apply 1 application topically once.   Yes [provider]  hydrOXYzine (ATARAX/VISTARIL) 25 MG tablet Take 25 mg by mouth at bedtime as needed for itching (DOESN'T TAKE WITH  TRAZODONE).   Yes [provider]  letrozole (FEMARA) 2.5 MG tablet Take 1 tablet (2.5 mg total) by mouth daily. 02/01/17  Yes Magrinat, Virgie Dad, MD  loratadine (CLARITIN) 10 MG tablet Take 10 mg by mouth daily.   Yes [provider]  losartan (COZAAR) 25 MG tablet Take 1 tablet (25 mg total) by mouth daily. 10/18/16  Yes Bensimhon, Shaune Pascal, MD  Menthol, Topical Analgesic, (ICY HOT EX) Apply 1 application topically daily as needed (muscle pain).   Yes [provider]  metolazone (ZAROXOLYN) 2.5 MG tablet Take 1 tablet (2.5 mg total) by mouth daily as needed (edema). 07/29/17  Yes Bensimhon, Shaune Pascal, MD  Milk Thistle 150 MG CAPS Take 450 mg by mouth daily.    Yes [provider]  Multiple Vitamin (MULTIVITAMIN WITH MINERALS) TABS Take 1 tablet by mouth daily. Reported on 10/13/2015   Yes [provider]  non-metallic deodorant Jethro Poling) MISC Apply 1 application topically daily as needed (deo).    Yes [provider]  omeprazole (PRILOSEC) 40 MG capsule TAKE 1 CAPSULE (40 MG TOTAL) BY MOUTH 2 (TWO) TIMES DAILY BEFORE A MEAL. BREAKFAST AND SUPPER 03/15/17  Yes Gatha Mayer, MD  ondansetron (ZOFRAN) 4 MG tablet Take 1 tablet (4 mg total) by mouth every 8 (eight) hours as needed for nausea or vomiting. 10/29/16  Yes Bensimhon, Shaune Pascal, MD  Oxycodone HCl 10 MG TABS Take 10 mg by mouth every 6 (six) hours as needed (pain).    Yes [provider]  PATADAY 0.2 % SOLN Place 1 drop into both eyes daily as needed (allergies).  01/22/13  Yes [provider]  pentosan polysulfate (ELMIRON) 100 MG capsule Take 100 mg by mouth 2 (two) times daily as needed (bladder spasms).    Yes [provider]  potassium chloride SA (K-DUR,KLOR-CON) 20 MEQ tablet Take 40 mEq by mouth 2 (two) times daily.   Yes [provider]  RESTASIS 0.05 % ophthalmic emulsion Place 1 drop into both eyes 2 (two) times daily.  03/31/17  Yes [provider]  Sodium Chloride-Sodium Bicarb (NETI POT SINUS [place] NA) Place 1 Dose into the nose daily as needed (congestion).   Yes [provider]  spironolactone (ALDACTONE) 25 MG tablet Take 1 tablet (25 mg total) daily by mouth. 04/28/17  Yes Bensimhon, Shaune Pascal, MD  torsemide (DEMADEX) 20 MG tablet TAKE 2 TABLETS (40 MG TOTAL) BY MOUTH DAILY. 11/02/16  Yes Larey Dresser, MD  traZODone (DESYREL) 50 MG tablet Take 50 mg by mouth at bedtime as needed for sleep.  10/29/14  Yes [provider]  triamcinolone cream (KENALOG) 0.1 % Apply 1 application topically 2 (two) times daily as needed (eczema).   Yes [provider]  venlafaxine XR (EFFEXOR-XR) 37.5 MG 24 hr capsule Take 1 capsule (37.5 mg total) by mouth daily with breakfast. 04/05/17  Yes Magrinat, Virgie Dad, MD  Vitamin D, Ergocalciferol, (DRISDOL) 50000 units CAPS capsule Take 1 capsule (50,000 Units total) by mouth every 7 (seven) days. 03/14/17  Yes Opalski, Deborah, DO  Blood Glucose Monitoring Suppl (FREESTYLE FREEDOM LITE) w/Device KIT Check fasting and 2 hour PP 03/22/17   Opalski, Deborah, DO  FREESTYLE UNISTICK II LANCETS MISC Check fasting blood sugar in the morning and check sugar after largest meal of the day. 03/22/17   Opalski, Deborah, DO  glucose blood (FREESTYLE TEST STRIPS) test strip To be filled with Freedom Lite test strips.   Check fasting blood sugar in the morning and check sugar after largest meal of the day. 03/22/17   Mellody Dance, DO   Allergies  Allergen Reactions  . Ace Inhibitors Other (See Comments)    hyperkalemia  . Reglan [Metoclopramide Hcl] Anxiety  . Adhesive [Tape] Itching and Rash    Specifically tegaderm, normal adhesive tape is ok   Review of Systems  Unable to perform ROS: Acuity of condition    Physical Exam  Constitutional:  Critically ill middle aged female who appears to be transitioning towards EOL/active Opens her eyes to voice and will occasionally moan  HENT:    Head: Normocephalic and atraumatic.  Pulmonary/Chest:  Venti mask. Mild increased work of breathing at rest Wheezing noted  Abdominal: Soft.  Genitourinary:  Genitourinary Comments: foley  Neurological:  Occasionally opens her eyes to voice, otherwise, minimally repsonsive  Skin:  Cool mottled  Psychiatric:  Unable to test No overt agitation  Nursing note and vitals reviewed.   Vital Signs: BP (!) 89/73   Pulse (!) 116   Temp 97.7 F (36.5 C) (Oral)   Resp 18   Ht '5\' 7"'$  (1.702 m)   Wt 120 kg (264 lb 8.8 oz)   SpO2  92%   BMI 41.43 kg/m  Pain Scale: FLACC   Pain Score: 2    SpO2: SpO2: 92 % O2 Device:SpO2: 92 % O2 Flow Rate: .O2 Flow Rate (L/min): 12 L/min  IO: Intake/output summary:   Intake/Output Summary (Last 24 hours) at 09/17/2017 0930 Last data filed at 09/17/2017 0700 Gross per 24 hour  Intake 1667.3 ml  Output 1400 ml  Net 267.3 ml    LBM: Last BM Date: 09/14/17 Baseline Weight: Weight: 122.6 kg (270 lb 3 oz) Most recent weight: Weight: 120 kg (264 lb 8.8 oz)     Palliative Assessment/Data:   Flowsheet Rows     Most Recent Value  Intake Tab  Referral Department  Critical care  Unit at Time of Referral  ICU  Palliative Care Primary Diagnosis  Cardiac  Date Notified  09/15/2017  Reason for referral  Clarify Goals of Care, Non-pain Symptom, Pain, Psychosocial or Spiritual support, End of Life Care Assistance  Date of Admission  09/14/2017  Date first seen by Palliative Care  09/17/17  # of days Palliative referral response time  1 Day(s)  # of days IP prior to Palliative referral  3  Clinical Assessment  Palliative Performance Scale Score  20%  Pain Max last 24 hours  Not able to report  Pain Min Last 24 hours  Not able to report  Dyspnea Max Last 24 Hours  Not able to report  Dyspnea Min Last 24 hours  Not able to report  Nausea Max Last 24 Hours  Not able to report  Nausea Min Last 24 Hours  Not able to report  Anxiety Max Last 24 Hours  Not  able to report  Anxiety Min Last 24 Hours  Not able to report  Other Max Last 24 Hours  Not able to report  Psychosocial & Spiritual Assessment  Palliative Care Outcomes  Patient/Family meeting held?  Yes  Who was at the meeting?  husband  Patient/Family wishes: Interventions discontinued/not started   BiPAP, Mechanical Ventilation, Trach  Palliative Care follow-up planned  Yes, Facility      Time In: 0900 Time Out: 1030 Time Total: 90 min Greater than 50%  of this time was spent counseling and coordinating care related to the above assessment and plan. Staffed with Dr. Haroldine Laws  Signed by: Dory Horn, NP   Please contact Palliative Medicine Team phone at [phone] for questions and concerns.  For individual provider: See Shea Evans

## 2017-09-17 NOTE — Consult Note (Signed)
PULMONARY / CRITICAL CARE MEDICINE   Name: [name] MRN: [id number] DOB: 08-31-1970    ADMISSION DATE:  10/07/2017 CONSULTATION DATE:  09/17/2017  REFERRING MD:  Dr. 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   Presented to ED on 09/15/2017 with confusion, unsteady gait, and recent falls. Admitted for Cardiogenic Shock. Had new onset A.Flutter. During stay required high dose levophed, milrinone, with increasing lasix dosage needs, complicated by acute kidney injury. On 4/5 patient with increased work of breathing and dyspnea. TEE on 4/5 revealed large LAA clot. Dr. Haroldine Laws spoke in detail with family regarding severity of condition. On 4/6 patient with progressive respiratory distress and PCCM was consulted.   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), Depression, Endometriosis, Family history of colon cancer, Family history of ovarian cancer, Fibromyalgia, GERD (gastroesophageal reflux disease), Headache, Hepatomegaly, History of radiation therapy (05/09/17- 06/08/2017), History of stomach ulcers, Hyperlipidemia, Hypertension, ICD (implantable cardiac defibrillator) in place, Insomnia, Interstitial cystitis, Left eye injury (12/2014), 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  PAST SURGICAL HISTORY: She  has a past surgical history that includes Laparoscopic endometriosis fulguration (1990's); Cardiac defibrillator placement (11/03/11); Tonsillectomy (1980's); Tubal ligation (1990's); Cholecystectomy (~ 2009); Port-a-cath removal (2010?); Nasal sinus surgery; implantable cardioverter defibrillator implant (N/A, 11/03/2011); Cesarean section; Abdominal hysterectomy; Colonoscopy; Breast excisional biopsy; Breast lumpectomy (2008; 2013); ICD REVISION (N/A, 02/21/2017); and Breast lumpectomy with radioactive seed and sentinel lymph node biopsy (Left, 02/21/2017).  Allergies  Allergen Reactions  . Ace Inhibitors Other (See Comments)    hyperkalemia  . Reglan [Metoclopramide Hcl] Anxiety  . Adhesive [Tape] Itching and Rash    Specifically tegaderm, normal adhesive tape is ok    No current facility-administered medications on file prior to encounter.    Current Outpatient Medications on File Prior to Encounter  Medication Sig  . acetaminophen (TYLENOL) 500 MG tablet Take 1,000 mg by mouth 2 (two) times daily as needed for moderate pain.  Marland Kitchen albuterol (PROVENTIL HFA;VENTOLIN HFA) 108 (90 Base) MCG/ACT inhaler Inhale 1-2 puffs into the lungs every 6 (six) hours as needed for wheezing or shortness of breath.   . allopurinol (ZYLOPRIM) 300 MG tablet Take 300 mg by mouth daily.  . baclofen (LIORESAL) 10 MG tablet TAKE 1 TABLET BY MOUTH 2 (TWO) TIMES DAILY AS NEEDED FOR MUSCLE SPASMS. (Patient taking differently: TAKE 1 TABLET BY MOUTH 3 TIMES DAILY AS NEEDED FOR MUSCLE SPASMS.)  . BLACK CURRANT SEED OIL PO Take 15 mLs by mouth daily.   . carvedilol (COREG) 25 MG tablet Take 0.5 tablets (12.5 mg total) by mouth 2 (two) times daily with a meal.  . Cholecalciferol (VITAMIN D3) 5000 UNITS CAPS Take 5,000 Units by  mouth daily.   . Coenzyme Q10 (CO Q-10) 100 MG CAPS Take 100 mg by mouth daily.  . Cyanocobalamin 2500 MCG SUBL Place  2,500 mcg under the tongue daily.  Marland Kitchen dicyclomine (BENTYL) 20 MG tablet Take 1 tablet (20 mg total) by mouth every 6 (six) hours.  . digoxin (LANOXIN) 0.125 MG tablet TAKE 0.5 TABLETS (0.0625 MG TOTAL) BY MOUTH DAILY.  . divalproex (DEPAKOTE ER) 500 MG 24 hr tablet Take 1.5 tablets (743m) twice a day  . DULoxetine (CYMBALTA) 30 MG capsule TAKE 1 CAPSULE BY MOUTH TWICE A DAY  . ferrous sulfate 325 (65 FE) MG tablet Take 325 mg by mouth daily with breakfast.  . gabapentin (NEURONTIN) 100 MG capsule Take 2 capsules by mouth 3 (three) times daily as needed (pain).   .Marland KitchenGLUCOSAMINE-CHONDROITIN PO Take 1,200 mg by mouth daily.  . hyaluronate sodium (RADIAPLEXRX) GEL Apply 1 application topically once.  . hydrOXYzine (ATARAX/VISTARIL) 25 MG tablet Take 25 mg by mouth at bedtime as needed for itching (DOESN'T TAKE WITH TRAZODONE).  .Marland Kitchenletrozole (FEMARA) 2.5 MG tablet Take 1 tablet (2.5 mg total) by mouth daily.  .Marland Kitchenloratadine (CLARITIN) 10 MG tablet Take 10 mg by mouth daily.  .Marland Kitchenlosartan (COZAAR) 25 MG tablet Take 1 tablet (25 mg total) by mouth daily.  . Menthol, Topical Analgesic, (ICY HOT EX) Apply 1 application topically daily as needed (muscle pain).  .Marland Kitchenmetolazone (ZAROXOLYN) 2.5 MG tablet Take 1 tablet (2.5 mg total) by mouth daily as needed (edema).  . Milk Thistle 150 MG CAPS Take 450 mg by mouth daily.   . Multiple Vitamin (MULTIVITAMIN WITH MINERALS) TABS Take 1 tablet by mouth daily. Reported on 53/01/7563 . non-metallic deodorant (Jethro Poling MISC Apply 1 application topically daily as needed (deo).   .Marland Kitchenomeprazole (PRILOSEC) 40 MG capsule TAKE 1 CAPSULE (40 MG TOTAL) BY MOUTH 2 (TWO) TIMES DAILY BEFORE A MEAL. BREAKFAST AND SUPPER  . ondansetron (ZOFRAN) 4 MG tablet Take 1 tablet (4 mg total) by mouth every 8 (eight) hours as needed for nausea or vomiting.  . Oxycodone HCl 10 MG TABS  Take 10 mg by mouth every 6 (six) hours as needed (pain).   .Marland KitchenPATADAY 0.2 % SOLN Place 1 drop into both eyes daily as needed (allergies).   . pentosan polysulfate (ELMIRON) 100 MG capsule Take 100 mg by mouth 2 (two) times daily as needed (bladder spasms).   . potassium chloride SA (K-DUR,KLOR-CON) 20 MEQ tablet Take 40 mEq by mouth 2 (two) times daily.  . RESTASIS 0.05 % ophthalmic emulsion Place 1 drop into both eyes 2 (two) times daily.   . Sodium Chloride-Sodium Bicarb (NETI POT SINUS WASH NA) Place 1 Dose into the nose daily as needed (congestion).  .Marland Kitchenspironolactone (ALDACTONE) 25 MG tablet Take 1 tablet (25 mg total) daily by mouth.  . torsemide (DEMADEX) 20 MG tablet TAKE 2 TABLETS (40 MG TOTAL) BY MOUTH DAILY.  . traZODone (DESYREL) 50 MG tablet Take 50 mg by mouth at bedtime as needed for sleep.   .Marland Kitchentriamcinolone cream (KENALOG) 0.1 % Apply 1 application topically 2 (two) times daily as needed (eczema).  . venlafaxine XR (EFFEXOR-XR) 37.5 MG 24 hr capsule Take 1 capsule (37.5 mg total) by mouth daily with breakfast.  . Vitamin D, Ergocalciferol, (DRISDOL) 50000 units CAPS capsule Take 1 capsule (50,000 Units total) by mouth every 7 (seven) days.  . Blood Glucose Monitoring Suppl (FREESTYLE FREEDOM LITE) w/Device KIT Check fasting and 2 hour PP  . FREESTYLE UNISTICK II LANCETS MISC Check fasting blood sugar in the morning and check sugar after largest meal of the day.  .Marland Kitchen  glucose blood (FREESTYLE TEST STRIPS) test strip To be filled with Freedom Lite test strips.   Check fasting blood sugar in the morning and check sugar after largest meal of the day.    FAMILY HISTORY:  Her indicated that her mother is alive. She indicated that her father is alive. She indicated that the status of her sister is unknown. She indicated that her brother is alive. She indicated that her maternal grandmother is deceased. She indicated that her maternal grandfather is deceased. She indicated that her paternal  grandmother is deceased. She indicated that her paternal grandfather is deceased. She indicated that her maternal aunt is alive. She indicated that her maternal uncle is alive. She indicated that her paternal aunt is deceased. She indicated that her paternal uncle is deceased. She indicated that the status of her unknown relative is unknown.   SOCIAL HISTORY: She  reports that she has never smoked. She has never used smokeless tobacco. She reports that she drinks alcohol. She reports that she does not use drugs.  REVIEW OF SYSTEMS:   Unable to review as patient is distressed  SUBJECTIVE:   VITAL SIGNS: BP (!) 70/29 (BP Location: Left Arm)   Pulse (!) 107   Temp (!) 97.1 F (36.2 C) (Axillary)   Resp (!) 27   Ht 5' 7" (1.702 m)   Wt 120 kg (264 lb 8.8 oz)   SpO2 100%   BMI 41.43 kg/m   HEMODYNAMICS: CVP:  [26 mmHg] 26 mmHg  VENTILATOR SETTINGS: FiO2 (%):  [35 %] 35 %  INTAKE / OUTPUT: I/O last 3 completed shifts: In: 2631.2 [P.O.:360; I.V.:2147.2; IV Piggyback:124] Out: 3975 [Urine:3975]  PHYSICAL EXAMINATION: General:  Adult female, moderate respiratory distress  Neuro:  Lethargic, opens eyes to verbal stimulation, follows commands  HEENT: Psychiatric nurse in place  Cardiovascular:  Tachy, Flutter  Lungs:  Diffuse Crackles, labored, Cardiac Wheeze  Abdomen:  Non-distended, generalized ABD pain, active bowel sounds  Musculoskeletal:  +3 BLE  Skin:  Warm, dry   LABS:  BMET Recent Labs  Lab 09/15/17 1736 09/28/2017 0245 09/22/2017 1643  NA 128* 127* 128*  K 3.4* 3.9 4.7  CL 86* 83* 85*  CO2 _0 BUN 52* 49* 49*  CREATININE 1.85* 1.72* 1.84*  GLUCOSE 116* 129* 126*    Electrolytes Recent Labs  Lab 09/15/17 0500 09/15/17 1736 09/24/2017 0245 09/19/2017 1643  CALCIUM 9.5 9.4 9.4 9.7  MG 2.0  --   --   --     CBC Recent Labs  Lab 09/21/2017 2312 09/15/17 0500 09/27/2017 0245  WBC 4.1 6.1 7.6  HGB 14.7 13.7 14.1  HCT 42.6 39.1 39.4  PLT 139* 156 175     Coag's Recent Labs  Lab 09/14/17 0448  INR 1.14    Sepsis Markers Recent Labs  Lab 09/14/17 0448 09/14/17 0509  LATICACIDVEN 1.8 1.91*    ABG Recent Labs  Lab 09/14/17 0448 09/14/2017 2147 09/14/2017 2347  PHART 7.544* 7.507* 7.457*  PCO2ART 28.2* 30.9* 34.9  PO2ART 105 63.0* 58.0*    Liver Enzymes Recent Labs  Lab 10/03/2017 2312 09/14/17 0448 09/19/2017 0245  AST 122* 49* 42*  ALT _1 ALKPHOS 135* 129* 133*  BILITOT 11.5* 9.9* 8.4*  ALBUMIN 3.0* 3.0* 2.9*    Cardiac Enzymes No results for input(s): TROPONINI, PROBNP in the last 168 hours.  Glucose Recent Labs  Lab 10/11/2017 2156  GLUCAP 84    Imaging No results found.   STUDIES:  CT A/P 4/3 > Severe cardiomegaly and a pack steatosis. No acute abdominal or pelvic abnormality CXR 4/3 > Progression of marked cardiac enlargement. Bibasilar atelectasis. Possible small left effusion. Negative for pulmonary edema. US Renal 4/3 > No acute abnormality noted [place] ABD 4/3 > Enlarged liver with increased echogenicity consistent with diffuse steatosis CXR 4/6 > Stable cardiomegaly. Progression of bilateral airspace opacities suspicious for evolving pulmonary edema. Superimposed pneumonia would be difficult to entirely exclude  CULTURES: MRSA PCR 4/3 > Negative   ANTIBIOTICS: None.   SIGNIFICANT EVENTS: 4/2 > Presents to ED   LINES/TUBES: Right CVC 4/3 >> Left Radial Aline 4/4 >>   DISCUSSION: 47 year old female with chronic systolic heart failure admitted on 4/2 with cardiogenic shock. PCCM consulted 4/6 for progressive respiratory distress.   ASSESSMENT / PLAN:   Acute Hypoxic Respiratory Failure in setting of Cardiogenic Shock/Acute on Chronic Systolic HF  -ABG 3.38/25.0/53  P:   DNI  Maintain Oxygen Saturation >92 Pulmonary Hygiene  Scheduled Nebs  Continue Lasix Gtt as below   Cardiogenic Shock  Acute on Chronic Systolic HF with Biventricular Dysfunction  Non-ischemic Cardiomyopathy  with EF 10% secondary to Chemotherapy  A.Flutter  -Followed by Dr. Haroldine Laws P:  Per Cardiology  Continue Lasix, Mirinone, Amiodarone  Currently on 34 mcg/min Levophed (will not exceed this limit)  DNR > As per Discussion Below   Acute Kidney Injury  Hyponatremia  Volume Overload in setting of Cardiogenic Shock P:   Trend BMP  Avoid Nephrotoxic Medications   Shock Liver  P:   Trend LFT  H/O Recurrent Breast CA  P:  Followed by Dr. Isidore Moos  DM    P:   Trend Glucose  SSI   Acute Pain/Air Hunger  H/O Conversion Disorder  P:   Morphine Gtt  Ativan PRN   FAMILY  - Updates: Extensive Conversation with Patient and Family (Husband, cousin, mother-in-law), understand severity of condition and poor prognosis. Would not want patient to suffer. Would like to continue current medical therapies while keeping patient comfortable, would not like to undergo CPR or be placed on mechanical ventilator > Code Status Updated in EMR as DNR with no escalation of care    Hayden Pedro, AGACNP-BC [place]  Pgr: [phone]  PCCM Pgr: [phone]

## 2017-09-17 NOTE — Progress Notes (Signed)
Advanced Heart Failure Rounding Note   Subjective:    Made DNR and started on morphine drip overnight.  Still on milrinone, high-dose NE and amio gtts.   Sedated but will awaken briefly to tactile stimuli. Rhonchorous. On NRB  Objective:   Weight Range:  Vital Signs:   Temp:  [97.1 F (36.2 C)-98.9 F (37.2 C)] 98.7 F (37.1 C) (04/06 1210) Pulse Rate:  [106-122] 114 (04/06 1200) Resp:  [15-33] 15 (04/06 1200) BP: (89)/(73) 89/73 (04/06 0733) SpO2:  [86 %-100 %] 88 % (04/06 1200) Arterial Line BP: (77-99)/(57-77) 81/65 (04/06 1200) FiO2 (%):  [35 %-50 %] 50 % (04/06 0800) Last BM Date: 09/14/17  Weight change: Filed Weights   09/14/17 1700 09/15/17 0500 09/26/2017 0500  Weight: 120.7 kg (266 lb 1.5 oz) 119.6 kg (263 lb 10.7 oz) 120 kg (264 lb 8.8 oz)    Intake/Output:   Intake/Output Summary (Last 24 hours) at 09/17/2017 1234 Last data filed at 09/17/2017 0700 Gross per 24 hour  Intake 1490.4 ml  Output 1100 ml  Net 390.4 ml     Physical Exam: General:  Terminally ill appearing.On NRB rhonchorous HEENT: normal Neck: supple. JVP to ear Carotids 2+ bilat; no bruits. No lymphadenopathy or thryomegaly appreciated. Cor: PMI laterally displaced. Tachy irregular +s3 Lungs: diffuse rhonchi Abdomen: obese soft, nontender, nondistended. No hepatosplenomegaly. No bruits or masses. Good bowel sounds. Extremities: no cyanosis, clubbing, rash, 2-3+ edema cool Neuro: sedated will awaken briefly to tactile stimul   Telemetry:  On comfort monitor     Labs: Basic Metabolic Panel: Recent Labs  Lab 09/15/17 0500 09/15/17 1736 10/06/2017 0245 09/24/2017 1643 09/17/17 0030 09/17/17 0522  NA 131* 128* 127* 128* 128* 128*  K 3.3* 3.4* 3.9 4.7 4.6 4.6  CL 87* 86* 83* 85* 86* 85*  CO2 _0 21* 22  GLUCOSE 117* 116* 129* 126* 134* 138*  BUN 57* 52* 49* 49* 49* 50*  CREATININE 2.15* 1.85* 1.72* 1.84* 2.03* 2.15*  CALCIUM 9.5 9.4 9.4 9.7 9.9 9.8  MG 2.0  --   --   --   1.7  --   PHOS  --   --   --   --  3.9  --     Liver Function Tests: Recent Labs  Lab 10/11/2017 2312 09/14/17 0448 09/26/2017 0245 09/17/17 0030  AST 122* 49* 42* 46*  ALT _1 ALKPHOS 135* 129* 133* 144*  BILITOT 11.5* 9.9* 8.4* 8.5*  PROT 6.8 7.3 7.4 7.6  ALBUMIN 3.0* 3.0* 2.9* 2.9*   No results for input(s): LIPASE, AMYLASE in the last 168 hours. Recent Labs  Lab 09/14/17 0113  AMMONIA 39*    CBC: Recent Labs  Lab 10/08/2017 2312 09/15/17 0500 09/22/2017 0245 09/17/17 0030 09/17/17 0522  WBC 4.1 6.1 7.6 11.9* 12.9*  NEUTROABS 2.6  --   --  9.1*  --   HGB 14.7 13.7 14.1 16.4* 16.4*  HCT 42.6 39.1 39.4 44.9 46.2*  MCV 100.0 96.3 94.3 95.9 95.9  PLT 139* 156 175 202 226    Cardiac Enzymes: Recent Labs  Lab 09/17/17 0030  TROPONINI 0.12*    BNP: BNP (last 3 results) Recent Labs    07/29/17 1148 09/14/17 0448  BNP 724.5* 542.4*    ProBNP (last 3 results) No results for input(s): PROBNP in the last 8760 hours.    Other results:  Imaging: Dg Chest 1 View  Result Date: 09/17/2017 CLINICAL DATA:  Worsening dyspnea  EXAM: CHEST  1 VIEW COMPARISON:  09/14/2017 FINDINGS: Cardiomegaly with worsening bilateral airspace opacities consistent with CHF. Superimposed pneumonia would be difficult to entirely exclude. Right IJ central line catheter tip terminates in the distal SVC-cavoatrial juncture. Right ventricular ICD lead is noted with ICD device projecting over the right mid hemithorax. Axillary clips are seen on the right. No acute osseous abnormality. IMPRESSION: Stable cardiomegaly. Progression of bilateral airspace opacities suspicious for evolving pulmonary edema. Superimposed pneumonia would be difficult to entirely exclude. Electronically Signed   By: Ashley Royalty M.D.   On: 09/17/2017 01:13     Medications:     Scheduled Medications: . Chlorhexidine Gluconate Cloth  6 each Topical Daily  . cycloSPORINE  1 drop Both Eyes BID  . divalproex  750 mg  Oral BID  . insulin aspart  2-6 Units Subcutaneous Q4H  . ipratropium  0.5 mg Nebulization Q6H  . levalbuterol  0.63 mg Nebulization Q6H  . methylPREDNISolone (SOLU-MEDROL) injection  60 mg Intravenous Q6H  . olopatadine  1 drop Both Eyes BID  . pantoprazole (PROTONIX) IV  40 mg Intravenous Q24H  . sodium chloride flush  10-40 mL Intracatheter Q12H  . sodium chloride flush  3 mL Intravenous Q12H    Infusions: . sodium chloride    . sodium chloride    . amiodarone 30 mg/hr (09/17/17 1119)  . furosemide (LASIX) infusion 10 mg/hr (09/17/17 0700)  . heparin 1,350 Units/hr (09/17/17 0700)  . milrinone 0.125 mcg/kg/min (09/17/17 3300)  . morphine 8 mg/hr (09/17/17 1233)  . norepinephrine (LEVOPHED) Adult infusion 38 mcg/min (09/17/17 0828)    PRN Medications: Place/Maintain arterial line **AND** sodium chloride, acetaminophen **OR** acetaminophen, glycopyrrolate, LORazepam, midazolam, morphine, ondansetron **OR** ondansetron (ZOFRAN) IV, sodium chloride flush, sodium chloride flush   Assessment:    47 y/o woman with h/o breast CA, morbid obesity, chronic systolic HF due to NICM and breast cancer admitted with cardiogenic shock and MSOF  Plan/Discussion:    1. 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 EF previously 20-25% - ECHO this admit 10% with severe RV dysfunction - suspect decompensation triggered by AFL -she is actively dying from end-stage HF.  Family has met with Palliative care.  - Mso4 drip started. Still with rhonchi and some increased WOB. Will increase rate. Use robinol drops - Discussed with family and will stop pressors - Suspect her to pass soon  2. AKI due to cardiorenal syndrome - Creatinine worsening   3. Atrial flutter - TEE this admit with large LAA thrombus  4. Shock liver  5. H/o recurrent breast CA - s/p mastectomy. Finished XRT 12/18  She is  terminally ill. Long discussion again with family today and we are all in agreement that our main goal at this point is to not let her suffer. Will increase MSO4 drip. Discontinue vasopressors. She will pass soon. D/w Palliative Care team.  CRITICAL CARE Performed by: Glori Bickers  Total critical care time: 35 minutes  Critical care time was exclusive of separately billable procedures and treating other patients.  Critical care was necessary to treat or prevent imminent or life-threatening deterioration.  Critical care was time spent personally by me (independent of midlevel providers or residents) on the following activities: development of treatment plan with patient and/or surrogate as well as nursing, discussions with consultants, evaluation of patient's response to treatment, examination of patient, obtaining history from patient or surrogate, ordering and performing treatments and  interventions, ordering and review of laboratory studies, ordering and review of radiographic studies, pulse oximetry and re-evaluation of patient's condition.    Glori Bickers MD 09/17/2017, 12:34 PM  Advanced Heart Failure Team Pager [phone] (M-F; [place])  Please contact [facility] for night-coverage after hours (4p -7a ) and weekends on [email or web address]

## 2017-09-20 ENCOUNTER — Ambulatory Visit: Payer: Self-pay | Admitting: Oncology

## 2017-09-20 ENCOUNTER — Other Ambulatory Visit: Payer: Self-pay

## 2017-09-21 ENCOUNTER — Telehealth: Payer: Self-pay | Admitting: *Deleted

## 2017-09-21 ENCOUNTER — Encounter (HOSPITAL_COMMUNITY): Payer: Self-pay | Admitting: *Deleted

## 2017-09-21 NOTE — Telephone Encounter (Signed)
This RN received VM from [name] stating " I am calling to let you know the [name] passed away on September 14, 2022 and that is why she didn't show for her appointment ".  No return call number left on VM.  Noted per Epic pt was admitted for cardiac issues with summary of death for 09/20/2017.  This note will be forwarded to MD for communication.

## 2017-09-21 NOTE — Progress Notes (Signed)
DC completed and signed by Dr Barbaraann Rondo [place] aware to p/u

## 2017-09-25 ENCOUNTER — Other Ambulatory Visit: Payer: Self-pay | Admitting: Oncology

## 2017-10-03 ENCOUNTER — Encounter: Payer: Self-pay | Admitting: Internal Medicine

## 2017-10-07 ENCOUNTER — Other Ambulatory Visit (HOSPITAL_COMMUNITY): Payer: Self-pay | Admitting: Internal Medicine

## 2017-10-12 NOTE — Progress Notes (Signed)
Spoke with ME regarding potential for ME case. She states the patient is not a candidate after chart review and that is ok to release to nursing home.

## 2017-10-12 NOTE — Progress Notes (Signed)
Pt noted to be asystole on monitor. Absence of pulse and spontaneous respiratory effort noted and confirmed via auscultation with Pricilla Riffle, RN. Family at bedside and aware. Time of death 33. CDS Katy Apo) notified.

## 2017-10-12 NOTE — Discharge Summary (Addendum)
  Advanced Heart Failure Death Summary  Death Summary   Patient ID: [name] MRN: [id number], DOB/AGE: April 05, 1971 47 y.o. Admit date: 09/27/2017 D/C date:     Sep 28, 2017    Primary Discharge Diagnoses:  1. Cardiogenic Shock 2. A/C Biventricular Heart Failure  3. AKI due to Cardiorenal Syndrome 4. New Onset Atrial Flutter 5. Shock Liver 6. H/O Recurrent Breast Cancer 7. Hyperkalemia 8. Hyponatremia.   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   She has been struggling with severe HF with EF 15% but not candidate for advanced therapies due to recent recurrent breast CA. Was just treated with XRT.   She presented to [facility] ED with fatigue, weakness, confusion and falls.   EKG was completed and showed Atrial Flutter.  She was hypotensive with SBP in the 70s. Admission labs work was concerning for multisystem organ failure with creatinine 3, bilirubin 11.5, AST 122, and potassium > 7.5. Cardiology consulted due to marked volume overload and new onset A Flutter. Placed on IV amiodarone and heparin drip with plan for eventual TEE/DC-CV. ECHOcardiogram was completed and showed severe LV and RV dysfunction.   Due to concern for cardiogenic shock and multisystem organ failure she was transferred to [facility] for the Advanced Heart Failure Team to manage. Central access was placed to guide diuresis and check mixed venous saturations. CVP was > 25 so high dose lasix was started and IV milrinone. Due to persistent hypotension, norepinephrine was added.    Unfortunately she remained hypotensive and sluggish urine output. Norepinephrine was maxed out with little improvement. In a final attempt to improve her cardiac output, TEE was performed with hopes of possible DC-CV but there was evidence of smoke and a large LAA clot so cardioversion was  aborted. She was not a candidate for advanced therapies due to MSOF, biventricular heart failure and recent breast CA.   Dr Haroldine Laws discussed the severity of her condition and that she would not survive hospitalization. She continued to deteriorate with increased dyspnea and pain despite hight dose diuretics and dual inotropes. The family remained hopeful and requested full code however early April 6th she developed lethargy and acute respiratory failure requiring venti mask.  CCM consulted and due to worsening clinical condition was the family requested DNR/DNI.   On April 6th Palliative Care was consulted. The family requested transition to comfort care. ICD was deactivated and she was placed on morphine drip. She passed quietly with family at her bedside on April 7th at 0704 am.    Duration of Discharge Encounter: Greater than 35 minutes   Signed, Darrick Grinder, NP 09/19/2017, 7:27 PM   Agree with above. I have edited note with my changes.   Glori Bickers, MD  10:14 PM

## 2017-10-12 DEATH — deceased

## 2017-10-24 ENCOUNTER — Ambulatory Visit (HOSPITAL_COMMUNITY): Admit: 2017-10-24 | Payer: Medicare Other | Admitting: Internal Medicine

## 2017-10-24 SURGERY — COLONOSCOPY WITH PROPOFOL
Anesthesia: Monitor Anesthesia Care

## 2017-10-28 ENCOUNTER — Encounter (HOSPITAL_COMMUNITY): Payer: Self-pay | Admitting: Internal Medicine

## 2017-10-31 ENCOUNTER — Ambulatory Visit: Payer: Self-pay | Admitting: Family Medicine

## 2018-01-04 ENCOUNTER — Ambulatory Visit: Payer: Medicare Other | Admitting: Neurology
